# Patient Record
Sex: Female | Born: 1945 | Race: Black or African American | Hispanic: No | Marital: Married | Smoking: Never smoker
Health system: Southern US, Community
[De-identification: ages and names within clinical notes are randomized; demographics above are authoritative.]

## PROBLEM LIST (undated history)

## (undated) DIAGNOSIS — I10 Essential (primary) hypertension: Secondary | ICD-10-CM

## (undated) DIAGNOSIS — I872 Venous insufficiency (chronic) (peripheral): Secondary | ICD-10-CM

## (undated) DIAGNOSIS — N760 Acute vaginitis: Secondary | ICD-10-CM

## (undated) DIAGNOSIS — G2581 Restless legs syndrome: Secondary | ICD-10-CM

## (undated) DIAGNOSIS — R609 Edema, unspecified: Secondary | ICD-10-CM

## (undated) DIAGNOSIS — F419 Anxiety disorder, unspecified: Secondary | ICD-10-CM

## (undated) DIAGNOSIS — M109 Gout, unspecified: Secondary | ICD-10-CM

## (undated) DIAGNOSIS — M519 Unspecified thoracic, thoracolumbar and lumbosacral intervertebral disc disorder: Secondary | ICD-10-CM

## (undated) DIAGNOSIS — J45909 Unspecified asthma, uncomplicated: Secondary | ICD-10-CM

## (undated) DIAGNOSIS — J309 Allergic rhinitis, unspecified: Secondary | ICD-10-CM

## (undated) DIAGNOSIS — D179 Benign lipomatous neoplasm, unspecified: Secondary | ICD-10-CM

## (undated) DIAGNOSIS — G4730 Sleep apnea, unspecified: Secondary | ICD-10-CM

## (undated) DIAGNOSIS — L723 Sebaceous cyst: Secondary | ICD-10-CM

## (undated) DIAGNOSIS — I491 Atrial premature depolarization: Secondary | ICD-10-CM

## (undated) DIAGNOSIS — N183 Chronic kidney disease, stage 3 unspecified: Secondary | ICD-10-CM

## (undated) DIAGNOSIS — R011 Cardiac murmur, unspecified: Secondary | ICD-10-CM

## (undated) DIAGNOSIS — E1165 Type 2 diabetes mellitus with hyperglycemia: Secondary | ICD-10-CM

## (undated) DIAGNOSIS — E785 Hyperlipidemia, unspecified: Secondary | ICD-10-CM

## (undated) DIAGNOSIS — IMO0001 Reserved for inherently not codable concepts without codable children: Secondary | ICD-10-CM

## (undated) DIAGNOSIS — E669 Obesity, unspecified: Secondary | ICD-10-CM

## (undated) DIAGNOSIS — N289 Disorder of kidney and ureter, unspecified: Secondary | ICD-10-CM

## (undated) DIAGNOSIS — R6889 Other general symptoms and signs: Secondary | ICD-10-CM

## (undated) DIAGNOSIS — I5032 Chronic diastolic (congestive) heart failure: Secondary | ICD-10-CM

## (undated) DIAGNOSIS — E559 Vitamin D deficiency, unspecified: Secondary | ICD-10-CM

## (undated) DIAGNOSIS — I319 Disease of pericardium, unspecified: Secondary | ICD-10-CM

## (undated) HISTORY — DX: Vitamin D deficiency, unspecified: E55.9

## (undated) HISTORY — DX: Reserved for inherently not codable concepts without codable children: IMO0001

## (undated) HISTORY — DX: Gout, unspecified: M10.9

## (undated) HISTORY — DX: Sleep apnea, unspecified: G47.30

## (undated) HISTORY — DX: Edema, unspecified: R60.9

## (undated) HISTORY — DX: Allergic rhinitis, unspecified: J30.9

## (undated) HISTORY — DX: Unspecified asthma, uncomplicated: J45.909

## (undated) HISTORY — DX: Type 2 diabetes mellitus with hyperglycemia: E11.65

## (undated) HISTORY — DX: Anxiety disorder, unspecified: F41.9

## (undated) HISTORY — DX: Other general symptoms and signs: R68.89

## (undated) HISTORY — DX: Cardiac murmur, unspecified: R01.1

## (undated) HISTORY — DX: Hyperlipidemia, unspecified: E78.5

## (undated) HISTORY — DX: Acute vaginitis: N76.0

## (undated) HISTORY — DX: Essential (primary) hypertension: I10

## (undated) HISTORY — DX: Disease of pericardium, unspecified: I31.9

## (undated) HISTORY — DX: Unspecified thoracic, thoracolumbar and lumbosacral intervertebral disc disorder: M51.9

## (undated) HISTORY — PX: PERICARDIUM SURGERY: SHX742

## (undated) HISTORY — DX: Restless legs syndrome: G25.81

## (undated) HISTORY — DX: Sebaceous cyst: L72.3

## (undated) HISTORY — DX: Benign lipomatous neoplasm, unspecified: D17.9

---

## 2000-08-23 HISTORY — PX: CARDIAC CATHETERIZATION: SHX172

## 2003-08-24 LAB — HM COLONOSCOPY: HM Colonoscopy: NORMAL

## 2003-12-18 ENCOUNTER — Ambulatory Visit (HOSPITAL_COMMUNITY): Admission: RE | Admit: 2003-12-18 | Discharge: 2003-12-18 | Payer: Self-pay | Admitting: Gastroenterology

## 2004-11-27 ENCOUNTER — Ambulatory Visit: Payer: Self-pay | Admitting: Hematology & Oncology

## 2005-02-24 ENCOUNTER — Ambulatory Visit: Payer: Self-pay | Admitting: Hematology & Oncology

## 2005-04-28 ENCOUNTER — Ambulatory Visit: Payer: Self-pay | Admitting: Hematology & Oncology

## 2005-06-30 ENCOUNTER — Ambulatory Visit: Payer: Self-pay | Admitting: Hematology & Oncology

## 2005-11-17 ENCOUNTER — Ambulatory Visit: Payer: Self-pay | Admitting: Hematology & Oncology

## 2005-12-16 LAB — CBC WITH DIFFERENTIAL/PLATELET
Eosinophils Absolute: 0.2 10*3/uL (ref 0.0–0.5)
LYMPH%: 31.5 % (ref 14.0–48.0)
MCV: 85.5 fL (ref 81.0–101.0)
MONO%: 10.4 % (ref 0.0–13.0)
NEUT#: 2.5 10*3/uL (ref 1.5–6.5)
Platelets: 216 10*3/uL (ref 145–400)
RBC: 3.89 10*6/uL (ref 3.70–5.32)

## 2005-12-16 LAB — FERRITIN: Ferritin: 112 ng/mL (ref 10–291)

## 2006-01-11 ENCOUNTER — Ambulatory Visit: Payer: Self-pay | Admitting: Hematology & Oncology

## 2006-02-10 LAB — CBC WITH DIFFERENTIAL/PLATELET
Basophils Absolute: 0 10*3/uL (ref 0.0–0.1)
Eosinophils Absolute: 0.2 10*3/uL (ref 0.0–0.5)
HCT: 36.3 % (ref 34.8–46.6)
HGB: 12.1 g/dL (ref 11.6–15.9)
LYMPH%: 32.5 % (ref 14.0–48.0)
MCV: 85 fL (ref 81.0–101.0)
MONO%: 11.6 % (ref 0.0–13.0)
NEUT#: 2.8 10*3/uL (ref 1.5–6.5)
Platelets: 167 10*3/uL (ref 145–400)
RDW: 13.9 % (ref 11.3–14.5)

## 2006-03-07 ENCOUNTER — Ambulatory Visit: Payer: Self-pay | Admitting: Hematology & Oncology

## 2006-03-10 LAB — CBC WITH DIFFERENTIAL/PLATELET
BASO%: 0.4 % (ref 0.0–2.0)
Basophils Absolute: 0 10*3/uL (ref 0.0–0.1)
EOS%: 2.9 % (ref 0.0–7.0)
HGB: 12.8 g/dL (ref 11.6–15.9)
MCH: 28 pg (ref 26.0–34.0)
MONO%: 10.7 % (ref 0.0–13.0)
NEUT#: 3.3 10*3/uL (ref 1.5–6.5)
RBC: 4.55 10*6/uL (ref 3.70–5.32)
RDW: 14.1 % (ref 11.3–14.5)
lymph#: 1.8 10*3/uL (ref 0.9–3.3)

## 2006-03-17 LAB — CBC WITH DIFFERENTIAL/PLATELET
Basophils Absolute: 0 10*3/uL (ref 0.0–0.1)
Eosinophils Absolute: 0.2 10*3/uL (ref 0.0–0.5)
HGB: 13 g/dL (ref 11.6–15.9)
MCV: 84.7 fL (ref 81.0–101.0)
MONO#: 0.7 10*3/uL (ref 0.1–0.9)
NEUT#: 3.5 10*3/uL (ref 1.5–6.5)
RBC: 4.59 10*6/uL (ref 3.70–5.32)
RDW: 13.8 % (ref 11.3–14.5)
WBC: 6.4 10*3/uL (ref 3.9–10.0)
lymph#: 2.1 10*3/uL (ref 0.9–3.3)

## 2006-04-07 LAB — CBC WITH DIFFERENTIAL/PLATELET
BASO%: 0.6 % (ref 0.0–2.0)
EOS%: 3.9 % (ref 0.0–7.0)
HCT: 36 % (ref 34.8–46.6)
LYMPH%: 32.8 % (ref 14.0–48.0)
MCH: 28.1 pg (ref 26.0–34.0)
MCHC: 33.3 g/dL (ref 32.0–36.0)
MCV: 84.3 fL (ref 81.0–101.0)
NEUT%: 51 % (ref 39.6–76.8)
Platelets: 189 10*3/uL (ref 145–400)

## 2006-05-03 ENCOUNTER — Ambulatory Visit: Payer: Self-pay | Admitting: Hematology & Oncology

## 2006-05-05 LAB — CBC WITH DIFFERENTIAL/PLATELET
BASO%: 0.7 % (ref 0.0–2.0)
EOS%: 3.3 % (ref 0.0–7.0)
MCH: 28.2 pg (ref 26.0–34.0)
MCHC: 33.5 g/dL (ref 32.0–36.0)
MCV: 84.2 fL (ref 81.0–101.0)
MONO%: 10.2 % (ref 0.0–13.0)
NEUT%: 53.2 % (ref 39.6–76.8)
RDW: 14.1 % (ref 11.3–14.5)
lymph#: 1.8 10*3/uL (ref 0.9–3.3)

## 2006-06-28 ENCOUNTER — Ambulatory Visit: Payer: Self-pay | Admitting: Hematology & Oncology

## 2006-06-30 LAB — CBC WITH DIFFERENTIAL/PLATELET
Basophils Absolute: 0.1 10*3/uL (ref 0.0–0.1)
Eosinophils Absolute: 0.2 10*3/uL (ref 0.0–0.5)
HCT: 36.3 % (ref 34.8–46.6)
LYMPH%: 36.7 % (ref 14.0–48.0)
MCHC: 33.1 g/dL (ref 32.0–36.0)
MONO#: 0.5 10*3/uL (ref 0.1–0.9)
NEUT#: 2.8 10*3/uL (ref 1.5–6.5)
NEUT%: 48.5 % (ref 39.6–76.8)
Platelets: 176 10*3/uL (ref 145–400)
WBC: 5.8 10*3/uL (ref 3.9–10.0)

## 2006-08-20 ENCOUNTER — Ambulatory Visit: Payer: Self-pay | Admitting: Hematology & Oncology

## 2006-08-25 LAB — CBC WITH DIFFERENTIAL/PLATELET
BASO%: 0.4 % (ref 0.0–2.0)
EOS%: 4.5 % (ref 0.0–7.0)
HCT: 36.6 % (ref 34.8–46.6)
LYMPH%: 31.5 % (ref 14.0–48.0)
MCH: 28.2 pg (ref 26.0–34.0)
MCHC: 33.1 g/dL (ref 32.0–36.0)
MCV: 85 fL (ref 81.0–101.0)
MONO%: 9.5 % (ref 0.0–13.0)
NEUT%: 54.1 % (ref 39.6–76.8)
Platelets: 181 10*3/uL (ref 145–400)
lymph#: 2.3 10*3/uL (ref 0.9–3.3)

## 2006-09-22 LAB — CBC WITH DIFFERENTIAL/PLATELET
BASO%: 0.7 % (ref 0.0–2.0)
EOS%: 4.5 % (ref 0.0–7.0)
MCH: 28.7 pg (ref 26.0–34.0)
MCHC: 33.4 g/dL (ref 32.0–36.0)
RBC: 4.22 10*6/uL (ref 3.70–5.32)
RDW: 14.1 % (ref 11.3–14.5)
lymph#: 2 10*3/uL (ref 0.9–3.3)

## 2006-12-12 ENCOUNTER — Ambulatory Visit: Payer: Self-pay | Admitting: Hematology & Oncology

## 2007-03-14 ENCOUNTER — Ambulatory Visit: Payer: Self-pay | Admitting: Hematology & Oncology

## 2007-03-16 LAB — CBC WITH DIFFERENTIAL/PLATELET
Basophils Absolute: 0 10*3/uL (ref 0.0–0.1)
Eosinophils Absolute: 0.3 10*3/uL (ref 0.0–0.5)
HCT: 35.1 % (ref 34.8–46.6)
HGB: 12.1 g/dL (ref 11.6–15.9)
LYMPH%: 29.2 % (ref 14.0–48.0)
MONO#: 0.6 10*3/uL (ref 0.1–0.9)
NEUT#: 4.4 10*3/uL (ref 1.5–6.5)
NEUT%: 58.7 % (ref 39.6–76.8)
Platelets: 166 10*3/uL (ref 145–400)
RBC: 4.19 10*6/uL (ref 3.70–5.32)
WBC: 7.6 10*3/uL (ref 3.9–10.0)

## 2007-05-19 ENCOUNTER — Ambulatory Visit: Payer: Self-pay | Admitting: Nephrology

## 2007-06-06 ENCOUNTER — Ambulatory Visit: Payer: Self-pay | Admitting: Hematology & Oncology

## 2008-04-20 ENCOUNTER — Emergency Department: Payer: Self-pay | Admitting: Emergency Medicine

## 2008-07-24 ENCOUNTER — Ambulatory Visit: Payer: Self-pay | Admitting: Family Medicine

## 2009-04-14 ENCOUNTER — Ambulatory Visit: Payer: Self-pay | Admitting: Family Medicine

## 2009-10-16 ENCOUNTER — Ambulatory Visit: Payer: Self-pay | Admitting: Family Medicine

## 2010-01-14 ENCOUNTER — Ambulatory Visit: Payer: Self-pay | Admitting: Family Medicine

## 2010-11-18 ENCOUNTER — Ambulatory Visit: Payer: Self-pay | Admitting: Family Medicine

## 2013-04-09 LAB — HM PAP SMEAR: HM Pap smear: NORMAL

## 2013-04-27 ENCOUNTER — Ambulatory Visit: Payer: Self-pay | Admitting: Family Medicine

## 2013-05-16 ENCOUNTER — Ambulatory Visit: Payer: Self-pay | Admitting: Family Medicine

## 2013-10-02 DIAGNOSIS — N189 Chronic kidney disease, unspecified: Secondary | ICD-10-CM | POA: Diagnosis not present

## 2013-10-02 DIAGNOSIS — I10 Essential (primary) hypertension: Secondary | ICD-10-CM | POA: Diagnosis not present

## 2013-10-02 DIAGNOSIS — IMO0001 Reserved for inherently not codable concepts without codable children: Secondary | ICD-10-CM | POA: Diagnosis not present

## 2013-10-02 DIAGNOSIS — J45909 Unspecified asthma, uncomplicated: Secondary | ICD-10-CM | POA: Diagnosis not present

## 2013-11-08 ENCOUNTER — Encounter: Payer: Self-pay | Admitting: *Deleted

## 2013-11-08 DIAGNOSIS — I10 Essential (primary) hypertension: Secondary | ICD-10-CM | POA: Diagnosis not present

## 2013-11-08 DIAGNOSIS — R6889 Other general symptoms and signs: Secondary | ICD-10-CM | POA: Diagnosis not present

## 2013-11-08 DIAGNOSIS — R0789 Other chest pain: Secondary | ICD-10-CM | POA: Diagnosis not present

## 2013-11-09 ENCOUNTER — Encounter (INDEPENDENT_AMBULATORY_CARE_PROVIDER_SITE_OTHER): Payer: Self-pay

## 2013-11-09 ENCOUNTER — Encounter: Payer: Self-pay | Admitting: Cardiovascular Disease

## 2013-11-09 ENCOUNTER — Other Ambulatory Visit: Payer: Self-pay | Admitting: Cardiovascular Disease

## 2013-11-09 ENCOUNTER — Ambulatory Visit (INDEPENDENT_AMBULATORY_CARE_PROVIDER_SITE_OTHER): Payer: Medicare Other | Admitting: Cardiovascular Disease

## 2013-11-09 VITALS — BP 158/70 | HR 67 | Ht 69.0 in | Wt 396.5 lb

## 2013-11-09 DIAGNOSIS — E785 Hyperlipidemia, unspecified: Secondary | ICD-10-CM

## 2013-11-09 DIAGNOSIS — R0602 Shortness of breath: Secondary | ICD-10-CM

## 2013-11-09 DIAGNOSIS — I4891 Unspecified atrial fibrillation: Secondary | ICD-10-CM | POA: Insufficient documentation

## 2013-11-09 DIAGNOSIS — R079 Chest pain, unspecified: Secondary | ICD-10-CM | POA: Diagnosis not present

## 2013-11-09 DIAGNOSIS — I10 Essential (primary) hypertension: Secondary | ICD-10-CM

## 2013-11-09 LAB — BASIC METABOLIC PANEL
Anion Gap: 7 (ref 7–16)
BUN: 23 mg/dL — ABNORMAL HIGH (ref 7–18)
Calcium, Total: 8.5 mg/dL (ref 8.5–10.1)
Chloride: 108 mmol/L — ABNORMAL HIGH (ref 98–107)
Co2: 23 mmol/L (ref 21–32)
Creatinine: 1.45 mg/dL — ABNORMAL HIGH (ref 0.60–1.30)
EGFR (African American): 44 — ABNORMAL LOW
EGFR (Non-African Amer.): 38 — ABNORMAL LOW
Glucose: 166 mg/dL — ABNORMAL HIGH (ref 65–99)
OSMOLALITY: 283 (ref 275–301)
POTASSIUM: 4 mmol/L (ref 3.5–5.1)
Sodium: 138 mmol/L (ref 136–145)

## 2013-11-09 LAB — CBC WITH DIFFERENTIAL/PLATELET
BASOS PCT: 0.8 %
Basophil #: 0.1 10*3/uL (ref 0.0–0.1)
EOS ABS: 0.2 10*3/uL (ref 0.0–0.7)
EOS PCT: 1.8 %
HCT: 34.9 % — ABNORMAL LOW (ref 35.0–47.0)
HGB: 11.1 g/dL — ABNORMAL LOW (ref 12.0–16.0)
LYMPHS PCT: 20.9 %
Lymphocyte #: 2.1 10*3/uL (ref 1.0–3.6)
MCH: 26.7 pg (ref 26.0–34.0)
MCHC: 31.9 g/dL — AB (ref 32.0–36.0)
MCV: 84 fL (ref 80–100)
MONO ABS: 0.7 x10 3/mm (ref 0.2–0.9)
Monocyte %: 6.7 %
NEUTROS ABS: 7.1 10*3/uL — AB (ref 1.4–6.5)
Neutrophil %: 69.8 %
PLATELETS: 244 10*3/uL (ref 150–440)
RBC: 4.18 10*6/uL (ref 3.80–5.20)
RDW: 14.9 % — AB (ref 11.5–14.5)
WBC: 10.2 10*3/uL (ref 3.6–11.0)

## 2013-11-09 LAB — TROPONIN I: Troponin-I: <0.02 ng/mL

## 2013-11-09 MED ORDER — APIXABAN 5 MG PO TABS: 5.0000 mg | ORAL_TABLET | Freq: Two times a day (BID) | ORAL | Status: DC

## 2013-11-09 NOTE — Patient Instructions (Signed)
Labs today.   Your physician has requested that you have an echocardiogram. Echocardiography is a painless test that uses sound waves to create images of your heart. It provides your doctor with information about the size and shape of your heart and how well your heart's chambers and valves are working. This procedure takes approximately one hour. There are no restrictions for this procedure.  Stop Aspirin.   Start Eliquis 5 mg twice daily (blood thinner).   Follow up after echo.

## 2013-11-09 NOTE — Assessment & Plan Note (Signed)
Blood pressure is elevated but seems to be coming down.

## 2013-11-09 NOTE — Assessment & Plan Note (Signed)
The patient has history of onset of atrial fibrillation. She is currently in atrial fibrillation of unknown duration. Ventricular rate is controlled on diltiazem. CHADS2 VASc score is 4. Thus, she is at high risk for thromboembolic complications. I discussed with her the risks and benefits of anticoagulation I decided to start treatment with Eliquis 5 mg twice daily. This is a good option given chronic kidney disease. Check labs today including CBC and basic metabolic profile. Obtain an echocardiogram.

## 2013-11-09 NOTE — Assessment & Plan Note (Addendum)
The describes substernal chest tightness is concerning given her multiple risk factors for coronary artery disease. Fortunately, she has not had any further discomfort since Wednesday. It is possible that the chest pain was triggered by uncontrolled hypertension with supply demand ischemia. I am going to check troponin level today to ensure that there was no ischemic event. I will also obtain an echocardiogram for evaluation especially with the presence of atrial fibrillation. She will require further ischemic workup which unfortunately is limited by morbid obesity. She is not able to exercise on a treadmill. Other forms of stress testing will probably have limited diagnostic accuracy. Radial cardiac catheterization as an option especially if she gets recurrent symptoms but will wait until obtaining further workup. I instructed her to call us with any recurrent symptoms. I also asked her to go to the emergency room if she develops chest pain that does not resolve after 5 minutes.

## 2013-11-09 NOTE — Progress Notes (Signed)
Primary care physician: Dr. Ancil Boozer  HPI  This is a pleasant 69 year old African American female who was referred for evaluation of chest pain. She reports being hospitalized in 2002 at [facility] for pericardial effusion which required drainage. She remembers having cardiac catheterization and was told that there was no significant coronary artery disease. There is a history of paroxysmal atrial fibrillation. She has multiple chronic medical conditions that include type 2 diabetes, hypertension, chronic kidney disease and morbid obesity. There is no history of stroke or myocardial infarction. She is a lifelong nonsmoker and has no family history of premature coronary artery disease. She had a recent gastroenteritis with vomiting and diarrhea. She was not able to keep her medications down for a few days and thus blood pressure was running high. On Wednesday, she had substernal chest tightness which lasted almost all day both at rest and was worse with physical activities. This has resolved since then with no recurrent episodes she complains of chronic exertional dyspnea with minimal activities with no orthopnea, PND or significant lower extremity edema.  Allergies  Allergen Reactions  . Ace Inhibitors      Current Outpatient Prescriptions on File Prior to Visit  Medication Sig Dispense Refill  . albuterol (PROVENTIL HFA;VENTOLIN HFA) 108 (90 BASE) MCG/ACT inhaler Inhale 2 puffs into the lungs every 6 (six) hours as needed for wheezing or shortness of breath.      . Cholecalciferol (VITAMIN D) 2000 UNITS tablet Take 2,000 Units by mouth daily.      . colchicine (COLCRYS) 0.6 MG tablet Take 0.6 mg by mouth 2 (two) times daily as needed.      . diltiazem (CARDIZEM CD) 300 MG 24 hr capsule Take 300 mg by mouth daily.      . fluticasone (FLONASE) 50 MCG/ACT nasal spray Place 2 sprays into both nostrils daily.      . Fluticasone-Salmeterol (ADVAIR) 250-50 MCG/DOSE AEPB Inhale 1 puff into the lungs 2 (two)  times daily.      . furosemide (LASIX) 40 MG tablet Take 40 mg by mouth as needed.      . hydrALAZINE (APRESOLINE) 50 MG tablet Take 50 mg by mouth 3 (three) times daily.      . Insulin Pen Needle (NOVOFINE) 30G X 8 MM MISC Inject 1 packet into the skin daily.      . irbesartan (AVAPRO) 300 MG tablet Take 300 mg by mouth daily.      . potassium chloride SA (K-DUR,KLOR-CON) 20 MEQ tablet Take 20 mEq by mouth as needed.      . pravastatin (PRAVACHOL) 80 MG tablet Take 80 mg by mouth daily.       No current facility-administered medications on file prior to visit.     Past Medical History  Diagnosis Date  . Allergic rhinitis, cause unspecified   . Unspecified vitamin D deficiency   . Gout, unspecified   . Edema   . Sebaceous cyst   . Lipoma of unspecified site   . Vaginitis and vulvovaginitis, unspecified   . Other general symptoms(780.99)   . Type II or unspecified type diabetes mellitus without mention of complication, uncontrolled   . Unspecified asthma(493.90)   . Other and unspecified disc disorder of lumbar region   . Unspecified sleep apnea   . Restless legs syndrome (RLS)   . Obesity, unspecified   . A-fib   . Unspecified disease of pericardium   . Chronic kidney disease, unspecified   . Heart murmur  as child  . Arrhythmia     afib  . Essential hypertension, benign   . Hyperlipidemia      Past Surgical History  Procedure Laterality Date  . Cardiac catheterization  2002    [facility]  . Pericardium surgery       Family History  Problem Relation Age of Onset  . Heart attack Mother   . Hypertension Mother      History   Social History  . Marital Status: Married    Spouse Name: N/A    Number of Children: N/A  . Years of Education: N/A   Occupational History  . Not on file.   Social History Main Topics  . Smoking status: Never Smoker   . Smokeless tobacco: Not on file  . Alcohol Use: No  . Drug Use: No  . Sexual Activity: Not on file   Other  Topics Concern  . Not on file   Social History Narrative  . No narrative on file     ROS A 10 point review of system was performed. It is negative other than that mentioned in the history of present illness.   PHYSICAL EXAM   BP 158/70  Pulse 67  Ht 5\' 9"  (1.753 m)  Wt 396 lb 8 oz (179.851 kg)  BMI 58.53 kg/m2 Constitutional: She is oriented to person, place, and time. She is morbidly obese. No distress.  HENT: No nasal discharge.  Head: Normocephalic and atraumatic.  Eyes: Pupils are equal and round. No discharge.  Neck: Normal range of motion. Neck supple. No JVD present. No thyromegaly present.  Cardiovascular: Normal rate, irregular rhythm, normal heart sounds. Exam reveals no gallop and no friction rub. There is a 1/6 systolic ejection murmur at the aortic area.  Pulmonary/Chest: Effort normal and breath sounds normal. No stridor. No respiratory distress. She has no wheezes. She has no rales. She exhibits no tenderness.  Abdominal: Soft. Bowel sounds are normal. She exhibits no distension. There is no tenderness. There is no rebound and no guarding.  Musculoskeletal: Normal range of motion. She exhibits no edema and no tenderness.  Neurological: She is alert and oriented to person, place, and time. Coordination normal.  Skin: Skin is warm and dry. No rash noted. She is not diaphoretic. No erythema. No pallor.  Psychiatric: She has a normal mood and affect. Her behavior is normal. Judgment and thought content normal.     EKG: Atrial fibrillation with ventricular rate of 67 beats per minute. No significant ST or T wave changes.   ASSESSMENT AND PLAN

## 2013-11-09 NOTE — Assessment & Plan Note (Signed)
I agree with treatment with a statin with a target LDL of less than 100 given history of diabetes.

## 2013-11-12 ENCOUNTER — Telehealth: Payer: Self-pay

## 2013-11-12 NOTE — Telephone Encounter (Signed)
Patient stated she could not afford Eliquis because her co pay is $160. She will check with her insurance about Xarelto and let us know.

## 2013-11-12 NOTE — Telephone Encounter (Signed)
Patient stated that her insurance said her copay on both meds will be around $160 and she can not afford that. She is also feeling short of breath and having chest pressure when she is up walking. It resolves with rest.

## 2013-11-12 NOTE — Telephone Encounter (Signed)
The shortness of breath and chest pain could be related to her weight but we have to rule out a cardiac cause.  We can try Savaysa (with discount card) or Warfarin. I have to see what her renal function first.

## 2013-11-12 NOTE — Telephone Encounter (Signed)
Ask her to check with her insurance to see if Xarelto is cheaper. Is she able to afford Eliquis or not?

## 2013-11-12 NOTE — Telephone Encounter (Signed)
Pt called to let Dr Fletcher Anon know that her blood thinner is over $45.

## 2013-11-13 DIAGNOSIS — I10 Essential (primary) hypertension: Secondary | ICD-10-CM | POA: Diagnosis not present

## 2013-11-13 DIAGNOSIS — F411 Generalized anxiety disorder: Secondary | ICD-10-CM | POA: Diagnosis not present

## 2013-11-13 DIAGNOSIS — E785 Hyperlipidemia, unspecified: Secondary | ICD-10-CM | POA: Diagnosis not present

## 2013-11-13 DIAGNOSIS — IMO0001 Reserved for inherently not codable concepts without codable children: Secondary | ICD-10-CM | POA: Diagnosis not present

## 2013-11-16 ENCOUNTER — Other Ambulatory Visit (INDEPENDENT_AMBULATORY_CARE_PROVIDER_SITE_OTHER): Payer: Medicare Other

## 2013-11-16 ENCOUNTER — Other Ambulatory Visit: Payer: Self-pay

## 2013-11-16 DIAGNOSIS — R0602 Shortness of breath: Secondary | ICD-10-CM

## 2013-11-16 DIAGNOSIS — I4891 Unspecified atrial fibrillation: Secondary | ICD-10-CM | POA: Diagnosis not present

## 2013-11-16 DIAGNOSIS — R079 Chest pain, unspecified: Secondary | ICD-10-CM | POA: Diagnosis not present

## 2013-11-16 NOTE — Telephone Encounter (Signed)
LVM 11/16/13

## 2013-11-16 NOTE — Telephone Encounter (Signed)
Labs are in from [facility]

## 2013-11-16 NOTE — Telephone Encounter (Signed)
Labs are stable. We can try Savaysa 30 mg once daily.

## 2013-11-19 MED ORDER — EDOXABAN TOSYLATE 30 MG PO TABS: 30.0000 mg | ORAL_TABLET | Freq: Once | ORAL | Status: DC

## 2013-11-19 NOTE — Telephone Encounter (Signed)
Informed patient that per Dr. Fletcher Anon start patient on Savaysa 30 mg daily  Informed patient that med was ordered, samples left at front desk with RX discount card

## 2013-11-20 ENCOUNTER — Telehealth: Payer: Self-pay | Admitting: *Deleted

## 2013-11-20 ENCOUNTER — Telehealth: Payer: Self-pay

## 2013-11-20 ENCOUNTER — Other Ambulatory Visit: Payer: Self-pay | Admitting: *Deleted

## 2013-11-20 MED ORDER — EDOXABAN TOSYLATE 30 MG PO TABS: 30.0000 mg | ORAL_TABLET | Freq: Every day | ORAL | Status: DC

## 2013-11-20 MED ORDER — APIXABAN 5 MG PO TABS: 5.0000 mg | ORAL_TABLET | Freq: Two times a day (BID) | ORAL | Status: DC

## 2013-11-20 NOTE — Telephone Encounter (Signed)
Patient stated she is going to get financial help for the Eliquis 5 mg twice daily and would rather stick with that after doing internet research.

## 2013-11-20 NOTE — Telephone Encounter (Signed)
LVM 3/31

## 2013-11-20 NOTE — Telephone Encounter (Signed)
Pt daughter called and wants to let you know some information regarding Xarelto. May call pt on cell if need be.

## 2013-11-20 NOTE — Telephone Encounter (Signed)
Patient has decided on Eliquis 5 mg bid  Per Danella Sensing PA-C it is ok to switch patient back to Eliquis.  Med changed  Samples left at front desk

## 2013-11-20 NOTE — Telephone Encounter (Signed)
Patient called and she has some questions on medications.

## 2013-11-20 NOTE — Telephone Encounter (Signed)
Requested Prescriptions   Signed Prescriptions Disp Refills  . Edoxaban Tosylate (SAVAYSA) 30 MG TABS 30 tablet 3    Sig: Take 30 mg by mouth daily.    Authorizing Provider: Kathlyn Sacramento A    Ordering User: Britt Bottom

## 2013-11-29 ENCOUNTER — Ambulatory Visit (INDEPENDENT_AMBULATORY_CARE_PROVIDER_SITE_OTHER): Payer: Medicare Other | Admitting: Cardiovascular Disease

## 2013-11-29 ENCOUNTER — Encounter: Payer: Self-pay | Admitting: Cardiovascular Disease

## 2013-11-29 VITALS — BP 138/68 | HR 79 | Ht 70.0 in | Wt 396.5 lb

## 2013-11-29 DIAGNOSIS — R079 Chest pain, unspecified: Secondary | ICD-10-CM | POA: Diagnosis not present

## 2013-11-29 DIAGNOSIS — I491 Atrial premature depolarization: Secondary | ICD-10-CM

## 2013-11-29 DIAGNOSIS — I4891 Unspecified atrial fibrillation: Secondary | ICD-10-CM

## 2013-11-29 DIAGNOSIS — I10 Essential (primary) hypertension: Secondary | ICD-10-CM

## 2013-11-29 MED ORDER — ASPIRIN 81 MG PO TABS: 81.0000 mg | ORAL_TABLET | Freq: Every day | ORAL | Status: DC

## 2013-11-29 NOTE — Patient Instructions (Signed)
Stop Eliquis.  Continue Aspirin 81 mg once daily.   Your physician wants you to follow-up in: 6 months.  You will receive a reminder letter in the mail two months in advance. If you don't receive a letter, please call our office to schedule the follow-up appointment.

## 2013-11-29 NOTE — Assessment & Plan Note (Signed)
Blood pressure is now back to normal.

## 2013-11-29 NOTE — Assessment & Plan Note (Signed)
Initial EKG was thought to be atrial fibrillation. However, after careful review I see that there small P waves with frequent PACs. Also during echocardiogram, there was normal A velocity. EKG today shows sinus rhythm with PACs as well. No need for anticoagulation at this time. Continue to monitor. Symptoms are controlled with diltiazem. I will have her followup with me in 6 months.

## 2013-11-29 NOTE — Assessment & Plan Note (Signed)
She reports resolution of symptoms. Chest pain was likely related to uncontrolled hypertension. She feels back to her baseline. Echocardiogram showed normal LV systolic function. No further workup is recommended at this time. If she has recurrent symptoms, further ischemic evaluation is recommended.

## 2013-11-29 NOTE — Progress Notes (Signed)
Primary care physician: Dr. Ancil Boozer  HPI  This is a pleasant 68 year old African American female who is here today for a followup visit regarding chest pain and possible atrial fibrillation.  She reports being hospitalized in 2002 at [facility] for pericardial effusion which required drainage. She remembers having cardiac catheterization and was told that there was no significant coronary artery disease. There is questionable history of paroxysmal atrial fibrillation versus PACs. She has multiple chronic medical conditions that include type 2 diabetes, hypertension, chronic kidney disease and morbid obesity. There is no history of stroke or myocardial infarction. She is a lifelong nonsmoker and has no family history of premature coronary artery disease.  She was seen recently for chest pain in the setting of gastroenteritis and uncontrolled hypertension. EKG showed possible atrial fibrillation . However, after second review it appears to be sinus with frequent PACs and ectopic atrial rhythm.  An echocardiogram was done which showed normal LV systolic function, mildly dilated left atrium, borderline pulmonary hypertension and trivial pericardial effusion. She feels back to her baseline. No more chest pain. Dyspnea improved significantly.   Allergies  Allergen Reactions  . Ace Inhibitors      Current Outpatient Prescriptions on File Prior to Visit  Medication Sig Dispense Refill  . albuterol (PROVENTIL HFA;VENTOLIN HFA) 108 (90 BASE) MCG/ACT inhaler Inhale 2 puffs into the lungs every 6 (six) hours as needed for wheezing or shortness of breath.      . Cholecalciferol (VITAMIN D) 2000 UNITS tablet Take 2,000 Units by mouth daily.      . colchicine (COLCRYS) 0.6 MG tablet Take 0.6 mg by mouth 2 (two) times daily as needed.      . diltiazem (CARDIZEM CD) 300 MG 24 hr capsule Take 300 mg by mouth daily.      . fluconazole (DIFLUCAN) 150 MG tablet Take 150 mg by mouth as needed.      . fluticasone  (FLONASE) 50 MCG/ACT nasal spray Place 2 sprays into both nostrils daily.      . Fluticasone-Salmeterol (ADVAIR) 250-50 MCG/DOSE AEPB Inhale 1 puff into the lungs 2 (two) times daily.      . furosemide (LASIX) 40 MG tablet Take 40 mg by mouth as needed.      . hydrALAZINE (APRESOLINE) 50 MG tablet Take 50 mg by mouth 3 (three) times daily.      . insulin aspart protamine- aspart (NOVOLOG MIX 70/30) (70-30) 100 UNIT/ML injection Inject 45-50 Units into the skin 2 (two) times daily with a meal.      . Insulin Pen Needle (NOVOFINE) 30G X 8 MM MISC Inject 1 packet into the skin daily.      . irbesartan (AVAPRO) 300 MG tablet Take 300 mg by mouth daily.      . potassium chloride SA (K-DUR,KLOR-CON) 20 MEQ tablet Take 20 mEq by mouth as needed.      . pravastatin (PRAVACHOL) 80 MG tablet Take 80 mg by mouth daily.      . promethazine (PHENERGAN) 12.5 MG tablet Take 12.5 mg by mouth every 6 (six) hours as needed for nausea or vomiting.       No current facility-administered medications on file prior to visit.     Past Medical History  Diagnosis Date  . Allergic rhinitis, cause unspecified   . Unspecified vitamin D deficiency   . Gout, unspecified   . Edema   . Sebaceous cyst   . Lipoma of unspecified site   . Vaginitis and vulvovaginitis, unspecified   .  Other general symptoms(780.99)   . Type II or unspecified type diabetes mellitus without mention of complication, uncontrolled   . Unspecified asthma(493.90)   . Other and unspecified disc disorder of lumbar region   . Unspecified sleep apnea   . Restless legs syndrome (RLS)   . Obesity, unspecified   . A-fib   . Unspecified disease of pericardium   . Chronic kidney disease, unspecified   . Heart murmur     as child  . Arrhythmia     afib  . Essential hypertension, benign   . Hyperlipidemia      Past Surgical History  Procedure Laterality Date  . Cardiac catheterization  2002    [facility]  . Pericardium surgery       Family  History  Problem Relation Age of Onset  . Heart attack Mother   . Hypertension Mother      History   Social History  . Marital Status: Married    Spouse Name: N/A    Number of Children: N/A  . Years of Education: N/A   Occupational History  . Not on file.   Social History Main Topics  . Smoking status: Never Smoker   . Smokeless tobacco: Not on file  . Alcohol Use: No  . Drug Use: No  . Sexual Activity: Not on file   Other Topics Concern  . Not on file   Social History Narrative  . No narrative on file     ROS A 10 point review of system was performed. It is negative other than that mentioned in the history of present illness.   PHYSICAL EXAM   BP 138/68  Pulse 79  Ht 5\' 10"  (1.778 m)  Wt 396 lb 8 oz (179.851 kg)  BMI 56.89 kg/m2 Constitutional: She is oriented to person, place, and time. She is morbidly obese. No distress.  HENT: No nasal discharge.  Head: Normocephalic and atraumatic.  Eyes: Pupils are equal and round. No discharge.  Neck: Normal range of motion. Neck supple. No JVD present. No thyromegaly present.  Cardiovascular: Normal rate, regular rhythm with frequent premature beats, normal heart sounds. Exam reveals no gallop and no friction rub. There is a 1/6 systolic ejection murmur at the aortic area.  Pulmonary/Chest: Effort normal and breath sounds normal. No stridor. No respiratory distress. She has no wheezes. She has no rales. She exhibits no tenderness.  Abdominal: Soft. Bowel sounds are normal. She exhibits no distension. There is no tenderness. There is no rebound and no guarding.  Musculoskeletal: Normal range of motion. She exhibits no edema and no tenderness.  Neurological: She is alert and oriented to person, place, and time. Coordination normal.  Skin: Skin is warm and dry. No rash noted. She is not diaphoretic. No erythema. No pallor.  Psychiatric: She has a normal mood and affect. Her behavior is normal. Judgment and thought content  normal.     EKG: Sinus  Rhythm   occasional PAC    PRi = 58  # PACs = 1. -Nonspecific ST depression  -Nondiagnostic.   ABNORMAL    ASSESSMENT AND PLAN

## 2013-12-11 ENCOUNTER — Ambulatory Visit: Payer: Medicare Other | Admitting: Podiatry

## 2013-12-11 ENCOUNTER — Encounter: Payer: Self-pay | Admitting: Podiatry

## 2013-12-11 DIAGNOSIS — L600 Ingrowing nail: Secondary | ICD-10-CM | POA: Diagnosis not present

## 2013-12-11 NOTE — Progress Notes (Signed)
   Subjective:    Patient ID: Amber Reyes, female    DOB: 07/13/1946, 68 y.o.   MRN: [id number]  HPI PT STATED RT FOOT 2ND AND LT 3RD TOENAIL ARE SORE FOR 2 WEEKS. THE TOENAILS ARE GETTING WORSE. THE TOENAIL GET AGGRAVATED BY PRESSURE AND WEARING CLOSED SHOES. TRIED TO KEEP THEM TRIM BUT THEY KEEP GROWING THE SAME THING.    Review of Systems  Respiratory: Positive for shortness of breath.   Cardiovascular: Positive for palpitations.  Musculoskeletal: Positive for back pain and gait problem.       Objective:   Physical Exam        Assessment & Plan:

## 2013-12-11 NOTE — Progress Notes (Signed)
Subjective:     Patient ID: Amber Reyes, female   DOB: 06-14-1946, 68 y.o.   MRN: [id number]  HPI patient states that I have ingrown toenails on my second toe right my third toe left that are sore and I cannot trim myself do to obesity and not being able to reach them and the fact that there ingrown in the corners   Review of Systems  All other systems reviewed and are negative.      Objective:   Physical Exam  Nursing note and vitals reviewed. Constitutional: She is oriented to person, place, and time.  Cardiovascular: Intact distal pulses.   Musculoskeletal: Normal range of motion.  Neurological: She is oriented to person, place, and time.  Skin: Skin is warm.   ingrown toenail noted both feet with pain in the medial second lateral third toe. Neurovascular status intact with muscle strength adequate and range of motion subtalar midtarsal joint within normal limits patient is found to have incurvated second toe right medial border third left better painful when pressed inability to cut     Assessment:     Ingrown toenail deformity second right third left with pain when pressed    Plan:     H&P and x-ray reviewed and I recommend removal of corner nails. Infiltrated the right second toe 60 mg Xylocaine Marcaine the left third 60 mg Xylocaine Marcaine and remove the medial quarter second right lateral corner third left and apply chemical 3 applications 30 seconds and sterile dressing instructed on soaks and reappoint

## 2013-12-11 NOTE — Patient Instructions (Signed)

## 2014-01-04 DIAGNOSIS — F411 Generalized anxiety disorder: Secondary | ICD-10-CM | POA: Diagnosis not present

## 2014-01-04 DIAGNOSIS — IMO0001 Reserved for inherently not codable concepts without codable children: Secondary | ICD-10-CM | POA: Diagnosis not present

## 2014-01-08 ENCOUNTER — Ambulatory Visit (INDEPENDENT_AMBULATORY_CARE_PROVIDER_SITE_OTHER): Payer: Medicare Other | Admitting: Podiatry

## 2014-01-08 VITALS — BP 152/62 | HR 93 | Resp 16

## 2014-01-08 DIAGNOSIS — B351 Tinea unguium: Secondary | ICD-10-CM

## 2014-01-08 DIAGNOSIS — M79609 Pain in unspecified limb: Secondary | ICD-10-CM | POA: Diagnosis not present

## 2014-01-08 DIAGNOSIS — L600 Ingrowing nail: Secondary | ICD-10-CM

## 2014-01-08 NOTE — Progress Notes (Signed)
Subjective:     Patient ID: Amber Reyes, female   DOB: Mar 08, 1946, 68 y.o.   MRN: [id number]  HPI patient is found to have severe painful nailbeds 1-5 both feet and the second nail right remained incurvated as it did not relieve the discomfort on the medial side because of the significant C-shaped appearance   Review of Systems     Objective:   Physical Exam Neurovascular status intact with no health history changes noted and incurvated third nail right unfortunately involving almost the entire nail and thick yellow nails remaining nails with pain in the corners and obesity not allowing patient to cut herself    Assessment:     Ingrown toenail with damage the entire nail plate second right and mycotic nail infections of remaining nails    Plan:     H&P performed and recommended removal of the entire second nail. Patient wants to have this done and today I infiltrated 60 mg Xylocaine Marcaine mixture remove the second nail exposed matrix and applied chemical phenol 4 applications followed by alcohol lavaged and sterile dressing. I then went ahead and debrided all remaining nails carefully taken the corners out with no iatrogenic bleeding and reappoint her recheck

## 2014-01-08 NOTE — Patient Instructions (Signed)

## 2014-04-04 DIAGNOSIS — I10 Essential (primary) hypertension: Secondary | ICD-10-CM | POA: Diagnosis not present

## 2014-04-04 DIAGNOSIS — N183 Chronic kidney disease, stage 3 unspecified: Secondary | ICD-10-CM | POA: Diagnosis not present

## 2014-04-04 DIAGNOSIS — N186 End stage renal disease: Secondary | ICD-10-CM | POA: Diagnosis not present

## 2014-04-04 DIAGNOSIS — E1129 Type 2 diabetes mellitus with other diabetic kidney complication: Secondary | ICD-10-CM | POA: Diagnosis not present

## 2014-04-09 ENCOUNTER — Ambulatory Visit: Payer: Medicare Other | Admitting: Podiatry

## 2014-05-28 ENCOUNTER — Ambulatory Visit: Payer: Self-pay | Admitting: Family Medicine

## 2014-05-28 DIAGNOSIS — Z1231 Encounter for screening mammogram for malignant neoplasm of breast: Secondary | ICD-10-CM | POA: Diagnosis not present

## 2014-05-28 LAB — HM MAMMOGRAPHY: HM Mammogram: NORMAL

## 2014-06-13 DIAGNOSIS — Z794 Long term (current) use of insulin: Secondary | ICD-10-CM | POA: Diagnosis not present

## 2014-06-13 DIAGNOSIS — E11319 Type 2 diabetes mellitus with unspecified diabetic retinopathy without macular edema: Secondary | ICD-10-CM | POA: Diagnosis not present

## 2014-06-20 ENCOUNTER — Encounter: Payer: Self-pay | Admitting: Cardiovascular Disease

## 2014-06-20 ENCOUNTER — Ambulatory Visit (INDEPENDENT_AMBULATORY_CARE_PROVIDER_SITE_OTHER): Payer: Medicare Other | Admitting: Cardiovascular Disease

## 2014-06-20 VITALS — BP 125/79 | HR 84 | Ht 69.0 in | Wt 393.8 lb

## 2014-06-20 DIAGNOSIS — R0789 Other chest pain: Secondary | ICD-10-CM

## 2014-06-20 DIAGNOSIS — I491 Atrial premature depolarization: Secondary | ICD-10-CM

## 2014-06-20 DIAGNOSIS — R0602 Shortness of breath: Secondary | ICD-10-CM | POA: Diagnosis not present

## 2014-06-20 DIAGNOSIS — I10 Essential (primary) hypertension: Secondary | ICD-10-CM

## 2014-06-20 NOTE — Assessment & Plan Note (Signed)
Blood pressure is well controlled on current medications. 

## 2014-06-20 NOTE — Assessment & Plan Note (Signed)
She is asymptomatic. Current EKG does not show any PACs. No further testing is recommended. She can follow-up as needed.

## 2014-06-20 NOTE — Progress Notes (Signed)
Primary care physician: Dr. Ancil Boozer  HPI  This is a pleasant 68 year old African American female who is here today for a followup visit regarding PACs.  She reports being hospitalized in 2002 at [facility] for pericardial effusion which required drainage. She remembers having cardiac catheterization and was told that there was no significant coronary artery disease. There is questionable history of paroxysmal atrial fibrillation versus PACs. She has multiple chronic medical conditions that include type 2 diabetes, hypertension, chronic kidney disease and morbid obesity. There is no history of stroke or myocardial infarction. She is a lifelong nonsmoker and has no family history of premature coronary artery disease.  She was seen for chest pain in the setting of gastroenteritis and uncontrolled hypertension. EKG showed frequent PACs and  ectopic atrial rhythm.  An echocardiogram  showed normal LV systolic function, mildly dilated left atrium, borderline pulmonary hypertension and trivial pericardial effusion. She has been doing very well and denies any chest pain, shortness of breath or palpitations.   No Known Allergies   Current Outpatient Prescriptions on File Prior to Visit  Medication Sig Dispense Refill  . albuterol (PROVENTIL HFA;VENTOLIN HFA) 108 (90 BASE) MCG/ACT inhaler Inhale 2 puffs into the lungs every 6 (six) hours as needed for wheezing or shortness of breath.      Marland Kitchen aspirin 81 MG tablet Take 1 tablet (81 mg total) by mouth daily.  30 tablet    . Cholecalciferol (VITAMIN D) 2000 UNITS tablet Take 2,000 Units by mouth daily.      . colchicine (COLCRYS) 0.6 MG tablet Take 0.6 mg by mouth 2 (two) times daily as needed.      . diltiazem (CARDIZEM CD) 300 MG 24 hr capsule Take 300 mg by mouth daily.      . fluconazole (DIFLUCAN) 150 MG tablet Take 150 mg by mouth as needed.      . fluticasone (FLONASE) 50 MCG/ACT nasal spray Place 2 sprays into both nostrils daily.      .  Fluticasone-Salmeterol (ADVAIR) 250-50 MCG/DOSE AEPB Inhale 1 puff into the lungs 2 (two) times daily.      . furosemide (LASIX) 40 MG tablet Take 40 mg by mouth as needed.      . hydrALAZINE (APRESOLINE) 50 MG tablet Take 50 mg by mouth 3 (three) times daily.      . insulin aspart protamine- aspart (NOVOLOG MIX 70/30) (70-30) 100 UNIT/ML injection Inject 45-50 Units into the skin 2 (two) times daily with a meal.      . Insulin Pen Needle (NOVOFINE) 30G X 8 MM MISC Inject 1 packet into the skin daily.      . irbesartan (AVAPRO) 300 MG tablet Take 300 mg by mouth daily.      . potassium chloride SA (K-DUR,KLOR-CON) 20 MEQ tablet Take 20 mEq by mouth as needed.      . pravastatin (PRAVACHOL) 80 MG tablet Take 80 mg by mouth daily.      . promethazine (PHENERGAN) 12.5 MG tablet Take 12.5 mg by mouth every 6 (six) hours as needed for nausea or vomiting.       No current facility-administered medications on file prior to visit.     Past Medical History  Diagnosis Date  . Allergic rhinitis, cause unspecified   . Unspecified vitamin D deficiency   . Gout, unspecified   . Edema   . Sebaceous cyst   . Lipoma of unspecified site   . Vaginitis and vulvovaginitis, unspecified   . Other general symptoms(780.99)   .  Type II or unspecified type diabetes mellitus without mention of complication, uncontrolled   . Unspecified asthma(493.90)   . Other and unspecified disc disorder of lumbar region   . Unspecified sleep apnea   . Restless legs syndrome (RLS)   . Obesity, unspecified   . A-fib   . Unspecified disease of pericardium   . Chronic kidney disease, unspecified   . Heart murmur     as child  . Arrhythmia     afib  . Essential hypertension, benign   . Hyperlipidemia      Past Surgical History  Procedure Laterality Date  . Cardiac catheterization  2002    [facility]  . Pericardium surgery       Family History  Problem Relation Age of Onset  . Heart attack Mother   . Hypertension  Mother      History   Social History  . Marital Status: Married    Spouse Name: N/A    Number of Children: N/A  . Years of Education: N/A   Occupational History  . Not on file.   Social History Main Topics  . Smoking status: Never Smoker   . Smokeless tobacco: Not on file  . Alcohol Use: No  . Drug Use: No  . Sexual Activity: Not on file   Other Topics Concern  . Not on file   Social History Narrative  . No narrative on file     ROS A 10 point review of system was performed. It is negative other than that mentioned in the history of present illness.   PHYSICAL EXAM   BP 125/79  Pulse 84  Ht 5\' 9"  (1.753 m)  Wt 393 lb 12 oz (178.604 kg)  BMI 58.12 kg/m2 Constitutional: She is oriented to person, place, and time. She is morbidly obese. No distress.  HENT: No nasal discharge.  Head: Normocephalic and atraumatic.  Eyes: Pupils are equal and round. No discharge.  Neck: Normal range of motion. Neck supple. No JVD present. No thyromegaly present.  Cardiovascular: Normal rate, regular rhythm with frequent premature beats, normal heart sounds. Exam reveals no gallop and no friction rub. There is a 1/6 systolic ejection murmur at the aortic area.  Pulmonary/Chest: Effort normal and breath sounds normal. No stridor. No respiratory distress. She has no wheezes. She has no rales. She exhibits no tenderness.  Abdominal: Soft. Bowel sounds are normal. She exhibits no distension. There is no tenderness. There is no rebound and no guarding.  Musculoskeletal: Normal range of motion. She exhibits no edema and no tenderness.  Neurological: She is alert and oriented to person, place, and time. Coordination normal.  Skin: Skin is warm and dry. No rash noted. She is not diaphoretic. No erythema. No pallor.  Psychiatric: She has a normal mood and affect. Her behavior is normal. Judgment and thought content normal.     EKG: Sinus  Rhythm  -RSR(V1) -nondiagnostic.   -Nonspecific ST  depression   +   Nonspecific T-abnormality  -Nondiagnostic.   ABNORMAL     ASSESSMENT AND PLAN

## 2014-06-20 NOTE — Assessment & Plan Note (Signed)
She reports no further chest pain or shortness of breath.

## 2014-06-20 NOTE — Patient Instructions (Signed)
Continue same medications.   Follow up as needed.  

## 2014-07-24 DIAGNOSIS — H43819 Vitreous degeneration, unspecified eye: Secondary | ICD-10-CM | POA: Diagnosis not present

## 2014-08-02 ENCOUNTER — Encounter: Payer: Self-pay | Admitting: Gastroenterology

## 2014-08-02 DIAGNOSIS — Z23 Encounter for immunization: Secondary | ICD-10-CM | POA: Diagnosis not present

## 2014-08-02 DIAGNOSIS — E1165 Type 2 diabetes mellitus with hyperglycemia: Secondary | ICD-10-CM | POA: Diagnosis not present

## 2014-08-02 DIAGNOSIS — G4733 Obstructive sleep apnea (adult) (pediatric): Secondary | ICD-10-CM | POA: Diagnosis not present

## 2014-08-02 DIAGNOSIS — I10 Essential (primary) hypertension: Secondary | ICD-10-CM | POA: Diagnosis not present

## 2014-08-02 DIAGNOSIS — E1122 Type 2 diabetes mellitus with diabetic chronic kidney disease: Secondary | ICD-10-CM | POA: Diagnosis not present

## 2014-08-02 DIAGNOSIS — N183 Chronic kidney disease, stage 3 (moderate): Secondary | ICD-10-CM | POA: Diagnosis not present

## 2014-08-02 DIAGNOSIS — I4891 Unspecified atrial fibrillation: Secondary | ICD-10-CM | POA: Diagnosis not present

## 2014-08-02 DIAGNOSIS — E785 Hyperlipidemia, unspecified: Secondary | ICD-10-CM | POA: Diagnosis not present

## 2014-08-04 LAB — LIPID PANEL
CHOLESTEROL: 150 mg/dL (ref 0–200)
HDL: 56 mg/dL (ref 35–70)
LDL Cholesterol: 71 mg/dL
TRIGLYCERIDES: 113 mg/dL (ref 40–160)

## 2014-09-04 ENCOUNTER — Telehealth: Payer: Self-pay | Admitting: *Deleted

## 2014-09-04 NOTE — Telephone Encounter (Signed)
Spoke with Amber Reyes and explained that Dr. Fuller Plan wants her to have an OV before her colonoscopy and that she will need to be done at [facility].    Understanding voiced.  She states she wishes to cancel procedure and will call back to set up appt

## 2014-09-04 NOTE — Telephone Encounter (Signed)
Office visit please

## 2014-09-04 NOTE — Telephone Encounter (Signed)
Dr. Fuller Plan,  This pt is coming in for her PV on 09-09-14.  While getting her chart ready, noted her BMI is 58.12, weight is 393. She does have several medical problems as well.  Would you like an OV with your or extender first or is a direct to the hospital ok?  Thanks, [employer]

## 2014-09-23 ENCOUNTER — Encounter: Payer: Medicare Other | Admitting: Gastroenterology

## 2014-10-10 DIAGNOSIS — H43819 Vitreous degeneration, unspecified eye: Secondary | ICD-10-CM | POA: Diagnosis not present

## 2015-02-17 ENCOUNTER — Other Ambulatory Visit: Payer: Self-pay | Admitting: Family Medicine

## 2015-02-20 DIAGNOSIS — H43813 Vitreous degeneration, bilateral: Secondary | ICD-10-CM | POA: Diagnosis not present

## 2015-03-24 ENCOUNTER — Telehealth: Payer: Self-pay | Admitting: Family Medicine

## 2015-03-24 NOTE — Telephone Encounter (Signed)
Pt is requesting a letter to be relieved from jury duty due to her disability.  Pt needs letter by 03/31/15

## 2015-03-28 NOTE — Telephone Encounter (Signed)
Patient notified

## 2015-03-28 NOTE — Telephone Encounter (Signed)
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

## 2015-04-12 ENCOUNTER — Encounter: Payer: Self-pay | Admitting: Family Medicine

## 2015-04-12 DIAGNOSIS — G4733 Obstructive sleep apnea (adult) (pediatric): Secondary | ICD-10-CM | POA: Insufficient documentation

## 2015-04-12 DIAGNOSIS — G2581 Restless legs syndrome: Secondary | ICD-10-CM | POA: Insufficient documentation

## 2015-04-12 DIAGNOSIS — N1832 Chronic kidney disease, stage 3b: Secondary | ICD-10-CM | POA: Insufficient documentation

## 2015-04-12 DIAGNOSIS — N183 Chronic kidney disease, stage 3 unspecified: Secondary | ICD-10-CM | POA: Insufficient documentation

## 2015-04-12 DIAGNOSIS — J4540 Moderate persistent asthma, uncomplicated: Secondary | ICD-10-CM | POA: Insufficient documentation

## 2015-04-12 DIAGNOSIS — F419 Anxiety disorder, unspecified: Secondary | ICD-10-CM | POA: Insufficient documentation

## 2015-04-12 DIAGNOSIS — M109 Gout, unspecified: Secondary | ICD-10-CM | POA: Insufficient documentation

## 2015-04-12 DIAGNOSIS — R600 Localized edema: Secondary | ICD-10-CM | POA: Insufficient documentation

## 2015-04-12 DIAGNOSIS — J309 Allergic rhinitis, unspecified: Secondary | ICD-10-CM | POA: Insufficient documentation

## 2015-04-12 DIAGNOSIS — M519 Unspecified thoracic, thoracolumbar and lumbosacral intervertebral disc disorder: Secondary | ICD-10-CM | POA: Insufficient documentation

## 2015-04-12 DIAGNOSIS — E559 Vitamin D deficiency, unspecified: Secondary | ICD-10-CM | POA: Insufficient documentation

## 2015-04-13 ENCOUNTER — Other Ambulatory Visit: Payer: Self-pay | Admitting: Family Medicine

## 2015-04-15 ENCOUNTER — Encounter: Payer: Self-pay | Admitting: Family Medicine

## 2015-04-15 ENCOUNTER — Ambulatory Visit (INDEPENDENT_AMBULATORY_CARE_PROVIDER_SITE_OTHER): Payer: Medicare Other | Admitting: Family Medicine

## 2015-04-15 VITALS — BP 136/70 | HR 96 | Temp 98.0°F | Resp 16 | Ht 69.0 in | Wt >= 6400 oz

## 2015-04-15 DIAGNOSIS — G4733 Obstructive sleep apnea (adult) (pediatric): Secondary | ICD-10-CM | POA: Diagnosis not present

## 2015-04-15 DIAGNOSIS — J4540 Moderate persistent asthma, uncomplicated: Secondary | ICD-10-CM

## 2015-04-15 DIAGNOSIS — E1165 Type 2 diabetes mellitus with hyperglycemia: Secondary | ICD-10-CM

## 2015-04-15 DIAGNOSIS — H0260 Xanthelasma of unspecified eye, unspecified eyelid: Secondary | ICD-10-CM | POA: Diagnosis not present

## 2015-04-15 DIAGNOSIS — E785 Hyperlipidemia, unspecified: Secondary | ICD-10-CM | POA: Diagnosis not present

## 2015-04-15 DIAGNOSIS — IMO0002 Reserved for concepts with insufficient information to code with codable children: Secondary | ICD-10-CM

## 2015-04-15 DIAGNOSIS — E1129 Type 2 diabetes mellitus with other diabetic kidney complication: Secondary | ICD-10-CM

## 2015-04-15 DIAGNOSIS — N183 Chronic kidney disease, stage 3 unspecified: Secondary | ICD-10-CM

## 2015-04-15 DIAGNOSIS — I10 Essential (primary) hypertension: Secondary | ICD-10-CM

## 2015-04-15 DIAGNOSIS — G2581 Restless legs syndrome: Secondary | ICD-10-CM

## 2015-04-15 DIAGNOSIS — M109 Gout, unspecified: Secondary | ICD-10-CM | POA: Diagnosis not present

## 2015-04-15 DIAGNOSIS — E559 Vitamin D deficiency, unspecified: Secondary | ICD-10-CM

## 2015-04-15 DIAGNOSIS — Z23 Encounter for immunization: Secondary | ICD-10-CM | POA: Diagnosis not present

## 2015-04-15 LAB — POCT GLYCOSYLATED HEMOGLOBIN (HGB A1C): Hemoglobin A1C: 7.1

## 2015-04-15 MED ORDER — DILTIAZEM HCL ER COATED BEADS 300 MG PO CP24: 300.0000 mg | ORAL_CAPSULE | Freq: Every day | ORAL | Status: DC

## 2015-04-15 MED ORDER — INSULIN ASPART PROT & ASPART (70-30 MIX) 100 UNIT/ML ~~LOC~~ SUSP: 60.0000 [IU] | Freq: Every day | SUBCUTANEOUS | Status: DC

## 2015-04-15 MED ORDER — ATORVASTATIN CALCIUM 80 MG PO TABS: 80.0000 mg | ORAL_TABLET | Freq: Every day | ORAL | Status: DC

## 2015-04-15 MED ORDER — IRBESARTAN 300 MG PO TABS: 300.0000 mg | ORAL_TABLET | Freq: Every day | ORAL | Status: DC

## 2015-04-15 MED ORDER — ROPINIROLE HCL 0.5 MG PO TABS: 0.5000 mg | ORAL_TABLET | Freq: Three times a day (TID) | ORAL | Status: DC

## 2015-04-15 MED ORDER — HYDRALAZINE HCL 50 MG PO TABS: 50.0000 mg | ORAL_TABLET | Freq: Three times a day (TID) | ORAL | Status: DC

## 2015-04-15 MED ORDER — POTASSIUM CHLORIDE ER 20 MEQ PO TBCR: 1.0000 | EXTENDED_RELEASE_TABLET | Freq: Every day | ORAL | Status: DC

## 2015-04-15 NOTE — Progress Notes (Signed)
   04/15/15 1030  Asthma History  Symptoms Daily  Nighttime Awakenings 0-2/month  Asthma interference with normal activity Some limitations  SABA use (not for EIB) > 2 days/wk--not > 1 x/day  Risk: Exacerbations requiring oral systemic steroids 0-1 / year  Asthma Severity Moderate Persistent
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
## 2015-04-16 DIAGNOSIS — M109 Gout, unspecified: Secondary | ICD-10-CM | POA: Diagnosis not present

## 2015-04-16 DIAGNOSIS — E559 Vitamin D deficiency, unspecified: Secondary | ICD-10-CM | POA: Diagnosis not present

## 2015-04-16 DIAGNOSIS — N183 Chronic kidney disease, stage 3 (moderate): Secondary | ICD-10-CM | POA: Diagnosis not present

## 2015-04-16 DIAGNOSIS — I10 Essential (primary) hypertension: Secondary | ICD-10-CM | POA: Diagnosis not present

## 2015-04-16 DIAGNOSIS — E785 Hyperlipidemia, unspecified: Secondary | ICD-10-CM | POA: Diagnosis not present

## 2015-04-17 LAB — COMPREHENSIVE METABOLIC PANEL
ALK PHOS: 92 IU/L (ref 39–117)
ALT: 6 IU/L (ref 0–32)
AST: 14 IU/L (ref 0–40)
Albumin/Globulin Ratio: 1.3 (ref 1.1–2.5)
Albumin: 3.8 g/dL (ref 3.6–4.8)
BUN / CREAT RATIO: 31 — AB (ref 11–26)
BUN: 40 mg/dL — ABNORMAL HIGH (ref 8–27)
Bilirubin Total: 0.3 mg/dL (ref 0.0–1.2)
CO2: 19 mmol/L (ref 18–29)
CREATININE: 1.29 mg/dL — AB (ref 0.57–1.00)
Calcium: 8.8 mg/dL (ref 8.7–10.3)
Chloride: 102 mmol/L (ref 97–108)
GFR calc Af Amer: 50 mL/min/{1.73_m2} — ABNORMAL LOW (ref >59)
GFR calc non Af Amer: 43 mL/min/{1.73_m2} — ABNORMAL LOW (ref >59)
GLOBULIN, TOTAL: 2.9 g/dL (ref 1.5–4.5)
Glucose: 147 mg/dL — ABNORMAL HIGH (ref 65–99)
POTASSIUM: 5.7 mmol/L — AB (ref 3.5–5.2)
SODIUM: 140 mmol/L (ref 134–144)
Total Protein: 6.7 g/dL (ref 6.0–8.5)

## 2015-04-17 LAB — CBC WITH DIFFERENTIAL/PLATELET
Basophils Absolute: 0.1 10*3/uL (ref 0.0–0.2)
Basos: 1 %
EOS (ABSOLUTE): 0.3 10*3/uL (ref 0.0–0.4)
EOS: 4 %
HEMATOCRIT: 33.9 % — AB (ref 34.0–46.6)
Hemoglobin: 11.1 g/dL (ref 11.1–15.9)
Immature Grans (Abs): 0 10*3/uL (ref 0.0–0.1)
Immature Granulocytes: 0 %
LYMPHS ABS: 1.9 10*3/uL (ref 0.7–3.1)
Lymphs: 23 %
MCH: 27.5 pg (ref 26.6–33.0)
MCHC: 32.7 g/dL (ref 31.5–35.7)
MCV: 84 fL (ref 79–97)
Monocytes Absolute: 0.5 10*3/uL (ref 0.1–0.9)
Monocytes: 7 %
NEUTROS PCT: 65 %
Neutrophils Absolute: 5.3 10*3/uL (ref 1.4–7.0)
PLATELETS: 231 10*3/uL (ref 150–379)
RBC: 4.04 x10E6/uL (ref 3.77–5.28)
RDW: 14.6 % (ref 12.3–15.4)
WBC: 8.1 10*3/uL (ref 3.4–10.8)

## 2015-04-17 LAB — LIPID PANEL
CHOL/HDL RATIO: 2.7 ratio (ref 0.0–4.4)
Cholesterol, Total: 139 mg/dL (ref 100–199)
HDL: 52 mg/dL (ref >39)
LDL Calculated: 70 mg/dL (ref 0–99)
Triglycerides: 83 mg/dL (ref 0–149)
VLDL Cholesterol Cal: 17 mg/dL (ref 5–40)

## 2015-04-17 LAB — URIC ACID: Uric Acid: 9.2 mg/dL — ABNORMAL HIGH (ref 2.5–7.1)

## 2015-04-17 LAB — VITAMIN D 25 HYDROXY (VIT D DEFICIENCY, FRACTURES): Vit D, 25-Hydroxy: 32.2 ng/mL (ref 30.0–100.0)

## 2015-04-20 ENCOUNTER — Other Ambulatory Visit: Payer: Self-pay | Admitting: Family Medicine

## 2015-04-20 DIAGNOSIS — E875 Hyperkalemia: Secondary | ICD-10-CM

## 2015-04-20 MED ORDER — ALLOPURINOL 100 MG PO TABS: 100.0000 mg | ORAL_TABLET | Freq: Two times a day (BID) | ORAL | Status: DC

## 2015-04-21 NOTE — Progress Notes (Signed)
Patient notified and mailed patient a copy of her lab slip.

## 2015-04-24 ENCOUNTER — Other Ambulatory Visit: Payer: Self-pay | Admitting: Family Medicine

## 2015-04-24 DIAGNOSIS — E875 Hyperkalemia: Secondary | ICD-10-CM | POA: Diagnosis not present

## 2015-04-25 ENCOUNTER — Other Ambulatory Visit: Payer: Self-pay | Admitting: Family Medicine

## 2015-04-25 DIAGNOSIS — E875 Hyperkalemia: Secondary | ICD-10-CM

## 2015-04-25 LAB — POTASSIUM: Potassium: 5.7 mmol/L — ABNORMAL HIGH (ref 3.5–5.2)

## 2015-04-25 NOTE — Progress Notes (Signed)
Patient notified and mail lab slip to be done in 1 week, once stopped Potassium supplements.

## 2015-05-06 ENCOUNTER — Other Ambulatory Visit: Payer: Self-pay | Admitting: Family Medicine

## 2015-05-06 DIAGNOSIS — E875 Hyperkalemia: Secondary | ICD-10-CM | POA: Diagnosis not present

## 2015-05-07 LAB — POTASSIUM: Potassium: 5.3 mmol/L — ABNORMAL HIGH (ref 3.5–5.2)

## 2015-05-07 NOTE — Progress Notes (Signed)
Left voice mail

## 2015-06-16 DIAGNOSIS — E113393 Type 2 diabetes mellitus with moderate nonproliferative diabetic retinopathy without macular edema, bilateral: Secondary | ICD-10-CM | POA: Diagnosis not present

## 2015-06-23 ENCOUNTER — Other Ambulatory Visit: Payer: Self-pay | Admitting: Family Medicine

## 2015-06-23 NOTE — Telephone Encounter (Signed)
Patient requesting refill. 

## 2015-06-28 ENCOUNTER — Other Ambulatory Visit: Payer: Self-pay | Admitting: Family Medicine

## 2015-08-15 ENCOUNTER — Ambulatory Visit (INDEPENDENT_AMBULATORY_CARE_PROVIDER_SITE_OTHER): Payer: Medicare Other | Admitting: Family Medicine

## 2015-08-15 ENCOUNTER — Encounter: Payer: Self-pay | Admitting: Family Medicine

## 2015-08-15 VITALS — BP 130/68 | HR 110 | Temp 98.0°F | Resp 18 | Ht 69.0 in | Wt >= 6400 oz

## 2015-08-15 DIAGNOSIS — IMO0002 Reserved for concepts with insufficient information to code with codable children: Secondary | ICD-10-CM

## 2015-08-15 DIAGNOSIS — E785 Hyperlipidemia, unspecified: Secondary | ICD-10-CM | POA: Diagnosis not present

## 2015-08-15 DIAGNOSIS — Z794 Long term (current) use of insulin: Secondary | ICD-10-CM

## 2015-08-15 DIAGNOSIS — E1165 Type 2 diabetes mellitus with hyperglycemia: Secondary | ICD-10-CM

## 2015-08-15 DIAGNOSIS — N183 Chronic kidney disease, stage 3 unspecified: Secondary | ICD-10-CM

## 2015-08-15 DIAGNOSIS — G2581 Restless legs syndrome: Secondary | ICD-10-CM

## 2015-08-15 DIAGNOSIS — E1122 Type 2 diabetes mellitus with diabetic chronic kidney disease: Secondary | ICD-10-CM | POA: Diagnosis not present

## 2015-08-15 DIAGNOSIS — I4891 Unspecified atrial fibrillation: Secondary | ICD-10-CM

## 2015-08-15 DIAGNOSIS — I10 Essential (primary) hypertension: Secondary | ICD-10-CM | POA: Diagnosis not present

## 2015-08-15 DIAGNOSIS — M109 Gout, unspecified: Secondary | ICD-10-CM | POA: Diagnosis not present

## 2015-08-15 DIAGNOSIS — G4733 Obstructive sleep apnea (adult) (pediatric): Secondary | ICD-10-CM

## 2015-08-15 LAB — POCT GLYCOSYLATED HEMOGLOBIN (HGB A1C): HEMOGLOBIN A1C: 7.2

## 2015-08-15 LAB — POCT UA - MICROALBUMIN: MICROALBUMIN (UR) POC: 20 mg/L

## 2015-08-15 MED ORDER — DILTIAZEM HCL ER COATED BEADS 300 MG PO CP24: 300.0000 mg | ORAL_CAPSULE | Freq: Every day | ORAL | Status: DC

## 2015-08-15 MED ORDER — PRAVASTATIN SODIUM 80 MG PO TABS: 80.0000 mg | ORAL_TABLET | Freq: Every day | ORAL | Status: DC

## 2015-08-15 MED ORDER — IRBESARTAN 300 MG PO TABS: 300.0000 mg | ORAL_TABLET | Freq: Every day | ORAL | Status: DC

## 2015-08-15 MED ORDER — INSULIN ASPART PROT & ASPART (70-30 MIX) 100 UNIT/ML PEN
PEN_INJECTOR | SUBCUTANEOUS | Status: DC
Start: 2015-08-15 — End: 2015-11-17

## 2015-08-15 MED ORDER — HYDRALAZINE HCL 50 MG PO TABS: 50.0000 mg | ORAL_TABLET | Freq: Three times a day (TID) | ORAL | Status: DC

## 2015-08-15 MED ORDER — FUROSEMIDE 40 MG PO TABS: 40.0000 mg | ORAL_TABLET | ORAL | Status: DC | PRN

## 2015-08-15 MED ORDER — ALLOPURINOL 100 MG PO TABS: 100.0000 mg | ORAL_TABLET | Freq: Two times a day (BID) | ORAL | Status: DC

## 2015-08-15 MED ORDER — ROPINIROLE HCL 0.5 MG PO TABS: 0.5000 mg | ORAL_TABLET | Freq: Every day | ORAL | Status: DC

## 2015-08-15 NOTE — Progress Notes (Signed)
Name: Amber Reyes   MRN: [id number]    DOB: 02-13-1946   Date:08/15/2015       Progress Note  Subjective  Chief Complaint  Chief Complaint  Patient presents with  . Medication Refill    4 month F/U  . Diabetes    Checks twice daily, Low- 79 Average-120 High-160  . Hypertension  . Hyperlipidemia  . Asthma  . Gout    HPI  DMII uncontrolled:  She has been compliant with medication. FSB at home has been around 120's, high of 160 post-prandially. She checks it daily. Taking Avapro for nephropathy- urine micro today is normal. No polyphagia, polydipsia or polyuria. No recent episodes of hypoglycemia, one month ago she went too long without eating and glucose went down to 70's, but usually does not happen.   HTN: taking medications and denies side effects of medications, bp at home is usually normal , below 140/90. She has not been wearing CPAP machine for the past couple of months because mask is not fitting her face well .   Hyperlipidemia: taking Pravastatin, last LDL was at goal, we will continue current medication   RLS: symptoms are still present,  described as soreness on both legs at night, improves when she rubs her legs. She was given prescription of Requip on her last visit, but she states it was not at the pharmacy when she went to pick it up   [place]: she used to be seen at [facility] but has not been back in many months. Denies pruritus or decrease in urinary volume. Last GFR had improved  OSA: stopped wearing CPAP a few months ago because her mask is not fitting well.  Morbid Obesity: she lost a few pounds since last visit. She is eating better now but still not active  Asthma Moderate persistent: one flare over the past month, increased rescue inhaler , continue the Advair and at this time she states she coughs in the am's, no wheezing, no nocturnal symptoms.   Atrial Fibrillation: on Cardizem CD, no palpitation, no SOB, no chest pain, taking aspirin. Seen by Dr. Fletcher Anon in  2015 and on his notes states questionable paroxysmal afib, she is not on anticoagulant therapy at this time. Discussed possible symptoms of afib  Patient Active Problem List   Diagnosis Date Noted  . 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 Atrial fibrillation, controlled ([place]) 04/12/2015  . Anxiety 04/12/2015  . Chronic kidney disease (CKD), stage III (moderate) 04/12/2015  . Edema extremities 04/12/2015  . 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 Asthma, moderate persistent 04/12/2015  . Morbid obesity ([place]) 04/12/2015  . Obstructive apnea 04/12/2015  . Restless leg 04/12/2015  . Allergic rhinitis 04/12/2015  . Vitamin D deficiency 04/12/2015  . Controlled gout 04/12/2015  . Premature atrial contractions 11/29/2013  . Essential hypertension, benign   . Hyperlipidemia     Past Surgical History  Procedure Laterality Date  . Cardiac catheterization  2002    [facility]  . Pericardium surgery      Family History  Problem Relation Age of Onset  . Heart attack Mother   . Hypertension Mother     Social History   Social History  . Marital Status: Married    Spouse Name: N/A  . Number of Children: N/A  . Years of Education: N/A   Occupational History  . Not on file.   Social History Main Topics  . Smoking status: Never Smoker   . Smokeless tobacco: Never Used  . Alcohol  Use: No  . Drug Use: No  . Sexual Activity: Not Currently   Other Topics Concern  . Not on file   Social History Narrative     Current outpatient prescriptions:  .  albuterol (PROVENTIL HFA;VENTOLIN HFA) 108 (90 BASE) MCG/ACT inhaler, Inhale 2 puffs into the lungs every 6 (six) hours as needed for wheezing or shortness of breath., Disp: , Rfl:  .  allopurinol (ZYLOPRIM) 100 MG tablet, Take 1 tablet (100 mg total) by mouth 2 (two) times daily., Disp: 180 tablet, Rfl: 1 .  aspirin 81 MG tablet, Take 1 tablet (81 mg total) by mouth daily., Disp: 30 tablet, Rfl:  .  Cholecalciferol (VITAMIN D) 2000 UNITS tablet,  Take 2,000 Units by mouth daily., Disp: , Rfl:  .  colchicine (COLCRYS) 0.6 MG tablet, Take 0.6 mg by mouth 2 (two) times daily as needed., Disp: , Rfl:  .  diltiazem (CARDIZEM CD) 300 MG 24 hr capsule, Take 1 capsule (300 mg total) by mouth daily., Disp: 90 capsule, Rfl: 1 .  fluticasone (FLONASE) 50 MCG/ACT nasal spray, USE TWO SPRAY(S) IN EACH NOSTRIL AT BEDTIME, Disp: 16 g, Rfl: 0 .  Fluticasone-Salmeterol (ADVAIR) 250-50 MCG/DOSE AEPB, Inhale 1 puff into the lungs 2 (two) times daily., Disp: , Rfl:  .  furosemide (LASIX) 40 MG tablet, Take 1 tablet (40 mg total) by mouth as needed., Disp: 90 tablet, Rfl: 1 .  hydrALAZINE (APRESOLINE) 50 MG tablet, Take 1 tablet (50 mg total) by mouth 3 (three) times daily., Disp: 270 tablet, Rfl: 1 .  insulin aspart protamine - aspart (NOVOLOG MIX 70/30 FLEXPEN) (70-30) 100 UNIT/ML FlexPen, INJECT 60 UNITS SUBCUTANEOUSLY IN THE MORNING AND INJECT 40 UNITS SUBCUTANEOUSLY BEFORE DINNER, Disp: 15 mL, Rfl: 3 .  Insulin Pen Needle (NOVOFINE) 30G X 8 MM MISC, Inject 1 packet into the skin daily., Disp: , Rfl:  .  irbesartan (AVAPRO) 300 MG tablet, Take 1 tablet (300 mg total) by mouth daily., Disp: 90 tablet, Rfl: 1 .  pravastatin (PRAVACHOL) 80 MG tablet, Take 1 tablet (80 mg total) by mouth daily., Disp: 90 tablet, Rfl: 1 .  rOPINIRole (REQUIP) 0.5 MG tablet, Take 1 tablet (0.5 mg total) by mouth at bedtime., Disp: 90 tablet, Rfl: 1  Allergies  Allergen Reactions  . Ace Inhibitors      ROS  Constitutional: Negative for fever or significant  weight change.  Respiratory: Positive  for cough and shortness of breath with activity .   Cardiovascular: Negative for chest pain or palpitations.  Gastrointestinal: Negative for abdominal pain, no bowel changes.  Musculoskeletal: Negative for gait problem or joint swelling.  Skin: Negative for rash.  Neurological: Negative for dizziness or headache.  No other specific complaints in a complete review of systems  (except as listed in HPI above).  Objective  Filed Vitals:   08/15/15 0807  BP: 144/58  Pulse: 110  Temp: 98 F (36.7 C)  TempSrc: Oral  Resp: 18  Height: 5\' 9"  (1.753 m)  Weight: 400 lb 9.6 oz (181.711 kg)  SpO2: 97%    Body mass index is 59.13 kg/(m^2).  Physical Exam  Constitutional: Patient appears well-developed . Morbid  Obese No distress.  HEENT: head atraumatic, normocephalic, pupils equal and reactive to light, neck supple, throat within normal limits Cardiovascular: Normal rate, regular rhythm and normal heart sounds. No murmur heard. 1 plus pitting BLE edema. Pulmonary/Chest: Effort normal and breath sounds normal. No respiratory distress. Abdominal: Soft. There is no tenderness. Psychiatric: Patient  has a normal mood and affect. behavior is normal. Judgment and thought content normal. Skin: fatty deposits on bridge of nose  Recent Results (from the past 2160 hour(s))  POCT HgB A1C     Status: None   Collection Time: 08/15/15  8:12 AM  Result Value Ref Range   Hemoglobin A1C 7.2   POCT UA - Microalbumin     Status: None   Collection Time: 08/15/15  8:12 AM  Result Value Ref Range   Microalbumin Ur, POC 20 mg/L   Creatinine, POC  mg/dL   Albumin/Creatinine Ratio, Urine, POC       PHQ2/9: Depression screen [facility] 2/9 08/15/2015 04/15/2015  Decreased Interest 0 0  Down, Depressed, Hopeless 0 0  PHQ - 2 Score 0 0     Fall Risk: Fall Risk  08/15/2015 04/15/2015  Falls in the past year? No No    Functional Status Survey: Is the patient deaf or have difficulty hearing?: No Does the patient have difficulty seeing, even when wearing glasses/contacts?: Yes (glasses) Does the patient have difficulty concentrating, remembering, or making decisions?: No Does the patient have difficulty walking or climbing stairs?: No Does the patient have difficulty dressing or bathing?: No Does the patient have difficulty doing errands alone such as visiting a doctor's  office or shopping?: No    Assessment & Plan  1. Uncontrolled type 2 diabetes mellitus with stage 3 chronic kidney disease, with long-term current use of insulin (HCC)  Needs to eat better, try to get hgbA1C below 7 - POCT HgB A1C - POCT UA - Microalbumin - insulin aspart protamine - aspart (NOVOLOG MIX 70/30 FLEXPEN) (70-30) 100 UNIT/ML FlexPen; INJECT 60 UNITS SUBCUTANEOUSLY IN THE MORNING AND INJECT 40 UNITS SUBCUTANEOUSLY BEFORE DINNER  Dispense: 15 mL; Refill: 3  2. Chronic kidney disease (CKD), stage III (moderate)  - Basic metabolic panel; Future  3. Essential hypertension, benign  BP may be elevated because she has not been wearing CPAP machine, we will send order to [facility]. She was on potassium, but because of hyperkalemia she has been off, recheck labs - irbesartan (AVAPRO) 300 MG tablet; Take 1 tablet (300 mg total) by mouth daily.  Dispense: 90 tablet; Refill: 1 - hydrALAZINE (APRESOLINE) 50 MG tablet; Take 1 tablet (50 mg total) by mouth 3 (three) times daily.  Dispense: 270 tablet; Refill: 1 - furosemide (LASIX) 40 MG tablet; Take 1 tablet (40 mg total) by mouth as needed.  Dispense: 90 tablet; Refill: 1 - diltiazem (CARDIZEM CD) 300 MG 24 hr capsule; Take 1 capsule (300 mg total) by mouth daily.  Dispense: 90 capsule; Refill: 1 - Basic metabolic panel; Future  4. Atrial fibrillation, controlled ([place])  Seen by Dr. Fletcher Anon, on aspirin only, because diagnosis was questionable  5. Obstructive apnea  Needs new mask so she can resume wearing it nightly   6. Restless leg  Re-sent prescription  - rOPINIRole (REQUIP) 0.5 MG tablet; Take 1 tablet (0.5 mg total) by mouth at bedtime.  Dispense: 90 tablet; Refill: 1  7. Controlled gout  - allopurinol (ZYLOPRIM) 100 MG tablet; Take 1 tablet (100 mg total) by mouth 2 (two) times daily.  Dispense: 180 tablet; Refill: 1 - Uric acid  8. Hyperlipidemia  - pravastatin (PRAVACHOL) 80 MG tablet; Take 1 tablet (80 mg total) by  mouth daily.  Dispense: 90 tablet; Refill: 1

## 2015-08-16 LAB — URIC ACID: Uric Acid: 5 mg/dL (ref 2.5–7.1)

## 2015-09-02 DIAGNOSIS — Z1211 Encounter for screening for malignant neoplasm of colon: Secondary | ICD-10-CM | POA: Diagnosis not present

## 2015-09-02 DIAGNOSIS — Z1212 Encounter for screening for malignant neoplasm of rectum: Secondary | ICD-10-CM | POA: Diagnosis not present

## 2015-09-03 LAB — COLOGUARD: Cologuard: NEGATIVE

## 2015-09-07 ENCOUNTER — Other Ambulatory Visit: Payer: Self-pay | Admitting: Family Medicine

## 2015-09-10 ENCOUNTER — Telehealth: Payer: Self-pay | Admitting: Family Medicine

## 2015-09-10 MED ORDER — BUDESONIDE-FORMOTEROL FUMARATE 160-4.5 MCG/ACT IN AERO: 2.0000 | INHALATION_SPRAY | Freq: Two times a day (BID) | RESPIRATORY_TRACT | Status: DC | PRN

## 2015-09-10 NOTE — Telephone Encounter (Signed)
Changed to Symbicort

## 2015-09-10 NOTE — Telephone Encounter (Signed)
Advair prescription was $600 after insurance and she was not able to use copay card due to [employer]. Is there something different she can take over the counter? I asked her if the pharmacy suggested anything that could take the place of the Advair and they told her that she would have to speak with her doctor because there is not another generic brand for this prescription.

## 2015-09-11 ENCOUNTER — Encounter: Payer: Self-pay | Admitting: Family Medicine

## 2015-09-18 ENCOUNTER — Telehealth: Payer: Self-pay

## 2015-09-18 DIAGNOSIS — G4733 Obstructive sleep apnea (adult) (pediatric): Secondary | ICD-10-CM

## 2015-09-18 NOTE — Telephone Encounter (Signed)
Got a fax from Phil Dopp with [facility] stating:  Due to patient being in Medicare since 2005 and [place] never billing Medicare for equipment patient does not fit into any Medicare scenarios. In order to qualify patient, we will need a new diagnostic study (this can be a home sleep test) and the patient will have to get new equipment.  Test was ordered.  Patient ID: [id number]  [facility] [address], [place], [place] [address] Phone: [phone]  Fax:[phone]

## 2015-09-19 NOTE — Telephone Encounter (Signed)
Thank you . Let me know if you need to sign it.

## 2015-10-11 ENCOUNTER — Other Ambulatory Visit: Payer: Self-pay | Admitting: Family Medicine

## 2015-11-17 ENCOUNTER — Encounter: Payer: Self-pay | Admitting: Family Medicine

## 2015-11-17 ENCOUNTER — Ambulatory Visit (INDEPENDENT_AMBULATORY_CARE_PROVIDER_SITE_OTHER): Payer: Medicare Other | Admitting: Family Medicine

## 2015-11-17 VITALS — BP 134/62 | HR 74 | Temp 98.1°F | Resp 16 | Ht 69.0 in | Wt 398.8 lb

## 2015-11-17 DIAGNOSIS — E1122 Type 2 diabetes mellitus with diabetic chronic kidney disease: Secondary | ICD-10-CM

## 2015-11-17 DIAGNOSIS — E1165 Type 2 diabetes mellitus with hyperglycemia: Secondary | ICD-10-CM | POA: Diagnosis not present

## 2015-11-17 DIAGNOSIS — Z794 Long term (current) use of insulin: Secondary | ICD-10-CM

## 2015-11-17 DIAGNOSIS — J069 Acute upper respiratory infection, unspecified: Secondary | ICD-10-CM

## 2015-11-17 DIAGNOSIS — J4541 Moderate persistent asthma with (acute) exacerbation: Secondary | ICD-10-CM

## 2015-11-17 DIAGNOSIS — N183 Chronic kidney disease, stage 3 (moderate): Secondary | ICD-10-CM

## 2015-11-17 DIAGNOSIS — IMO0002 Reserved for concepts with insufficient information to code with codable children: Secondary | ICD-10-CM

## 2015-11-17 MED ORDER — INSULIN LISPRO PROT & LISPRO (75-25 MIX) 100 UNIT/ML KWIKPEN: 40.0000 [IU] | PEN_INJECTOR | Freq: Two times a day (BID) | SUBCUTANEOUS | Status: DC

## 2015-11-17 MED ORDER — FLUTICASONE FUROATE-VILANTEROL 100-25 MCG/INH IN AEPB: 1.0000 | INHALATION_SPRAY | Freq: Every day | RESPIRATORY_TRACT | Status: DC

## 2015-11-17 NOTE — Progress Notes (Signed)
Name: Amber Reyes   MRN: [id number]    DOB: June 07, 1946   Date:11/17/2015       Progress Note  Subjective  Chief Complaint  Chief Complaint  Patient presents with  . URI    Onset-1 week, patient has been cough with clear mucus, ear pressure with thubbing, wheezing, and states symptoms have improved.  But would like to still get checked out.   . Gout    Colcrys is very expensive and needs to talk about cheaper options  . Diabetes    Humalog 75/25 is a cheaper option, due to Novolog is 330 for 15 day supply due to patient having to meet deductible      HPI  URI: she developed cold symptoms a few weeks ago. Symptoms were initially clear rhinorrhea, ear pressure, cough. She took some otc medication and nose symptoms improved, but went down to her chest. She states last week she was having a productive cough, wheezing, increase in SOB, but no fever. She went from Symbicort that she had at home to Advair ( prescription that she used to take ) and symptoms have improved since. Today she states no wheezing, she had to clear her throat this am, but no cough since.   Gout: rx of Colcrys is too expensive but only taking it prn, advised to only get a few pills instead of a 30 day supply  DMII: trying to change her insurance coverage. Novolog mix is too expensive, over $300,00, she states she wants to try Humalog mix instead. She has been still using her insulin. FSBS at home has been around 130's fasting. She has a follow up in one month for DM, advised to continue a diabetic diet   Patient Active Problem List   Diagnosis Date Noted  . 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 Atrial fibrillation, controlled ([place]) 04/12/2015  . Anxiety 04/12/2015  . Chronic kidney disease (CKD), stage III (moderate) 04/12/2015  . Edema extremities 04/12/2015  . 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 Asthma, moderate persistent 04/12/2015  . Morbid obesity ([place]) 04/12/2015  . Obstructive apnea 04/12/2015  .  Restless leg 04/12/2015  . Allergic rhinitis 04/12/2015  . Vitamin D deficiency 04/12/2015  . Controlled gout 04/12/2015  . Premature atrial contractions 11/29/2013  . Essential hypertension, benign   . Hyperlipidemia     Past Surgical History  Procedure Laterality Date  . Cardiac catheterization  2002    [facility]  . Pericardium surgery      Family History  Problem Relation Age of Onset  . Heart attack Mother   . Hypertension Mother     Social History   Social History  . Marital Status: Married    Spouse Name: N/A  . Number of Children: N/A  . Years of Education: N/A   Occupational History  . Not on file.   Social History Main Topics  . Smoking status: Never Smoker   . Smokeless tobacco: Never Used  . Alcohol Use: No  . Drug Use: No  . Sexual Activity: Not Currently   Other Topics Concern  . Not on file   Social History Narrative     Current outpatient prescriptions:  .  albuterol (PROVENTIL HFA;VENTOLIN HFA) 108 (90 BASE) MCG/ACT inhaler, Inhale 2 puffs into the lungs every 6 (six) hours as needed for wheezing or shortness of breath., Disp: , Rfl:  .  allopurinol (ZYLOPRIM) 100 MG tablet, Take 1 tablet (100 mg total) by mouth 2 (two) times daily., Disp: 180  tablet, Rfl: 1 .  aspirin 81 MG tablet, Take 1 tablet (81 mg total) by mouth daily., Disp: 30 tablet, Rfl:  .  Cholecalciferol (VITAMIN D) 2000 UNITS tablet, Take 2,000 Units by mouth daily., Disp: , Rfl:  .  COLCRYS 0.6 MG tablet, TAKE ONE TABLET BY MOUTH TWICE DAILY AS NEEDED FOR GOUT, Disp: 30 tablet, Rfl: 5 .  diltiazem (CARDIZEM CD) 300 MG 24 hr capsule, Take 1 capsule (300 mg total) by mouth daily., Disp: 90 capsule, Rfl: 1 .  fluticasone (FLONASE) 50 MCG/ACT nasal spray, USE TWO SPRAY(S) IN EACH NOSTRIL AT BEDTIME, Disp: 16 g, Rfl: 0 .  furosemide (LASIX) 40 MG tablet, Take 1 tablet (40 mg total) by mouth as needed., Disp: 90 tablet, Rfl: 1 .  hydrALAZINE (APRESOLINE) 50 MG tablet, Take 1 tablet (50 mg  total) by mouth 3 (three) times daily., Disp: 270 tablet, Rfl: 1 .  Insulin Pen Needle (NOVOFINE) 30G X 8 MM MISC, Inject 1 packet into the skin daily., Disp: , Rfl:  .  irbesartan (AVAPRO) 300 MG tablet, Take 1 tablet (300 mg total) by mouth daily., Disp: 90 tablet, Rfl: 1 .  pravastatin (PRAVACHOL) 80 MG tablet, Take 1 tablet (80 mg total) by mouth daily., Disp: 90 tablet, Rfl: 1 .  rOPINIRole (REQUIP) 0.5 MG tablet, Take 1 tablet (0.5 mg total) by mouth at bedtime., Disp: 90 tablet, Rfl: 1 .  fluticasone furoate-vilanterol (BREO ELLIPTA) 100-25 MCG/INH AEPB, Inhale 1 puff into the lungs daily., Disp: 60 each, Rfl: 2 .  Insulin Lispro Prot & Lispro (HUMALOG MIX 75/25 KWIKPEN) (75-25) 100 UNIT/ML Kwikpen, Inject 40-60 Units into the skin 2 (two) times daily., Disp: 15 mL, Rfl: 2  Allergies  Allergen Reactions  . Ace Inhibitors      ROS  Constitutional: Negative for fever or significant  weight change.  Respiratory: Positive  for cough , no  shortness of breath.   Cardiovascular: Negative for chest pain or palpitations.  Gastrointestinal: Negative for abdominal pain, no bowel changes.  Musculoskeletal: Negative for gait problem or joint swelling.  Skin: Negative for rash.  Neurological: Negative for dizziness or headache.  No other specific complaints in a complete review of systems (except as listed in HPI above). Objective  Filed Vitals:   11/17/15 0825  BP: 134/62  Pulse: 74  Temp: 98.1 F (36.7 C)  TempSrc: Oral  Resp: 16  Height: 5\' 9"  (1.753 m)  Weight: 398 lb 12.8 oz (180.894 kg)  SpO2: 97%    Body mass index is 58.87 kg/(m^2).  Physical Exam  Constitutional: Patient appears well-developed and well-nourished. Obese No distress.  HEENT: head atraumatic, normocephalic, pupils equal and reactive to light,, neck supple, throat within normal limits Cardiovascular: Normal rate, regular rhythm and normal heart sounds.  No murmur heard. Trace  BLE edema. Pulmonary/Chest:  Effort normal and breath sounds normal. No respiratory distress. Abdominal: Soft.  There is no tenderness. Psychiatric: Patient has a normal mood and affect. behavior is normal. Judgment and thought content normal.  Recent Results (from the past 2160 hour(s))  Cologuard     Status: None   Collection Time: 09/02/15 12:00 AM  Result Value Ref Range   Cologuard Negative     Comment: results were released on 09/11/15 @ 7:07     PHQ2/9: Depression screen [facility] 2/9 11/17/2015 08/15/2015 04/15/2015  Decreased Interest 0 0 0  Down, Depressed, Hopeless 0 0 0  PHQ - 2 Score 0 0 0     Fall  Risk: Fall Risk  11/17/2015 08/15/2015 04/15/2015  Falls in the past year? No No No     Functional Status Survey: Is the patient deaf or have difficulty hearing?: No Does the patient have difficulty seeing, even when wearing glasses/contacts?: No Does the patient have difficulty concentrating, remembering, or making decisions?: No Does the patient have difficulty walking or climbing stairs?: No Does the patient have difficulty dressing or bathing?: No Does the patient have difficulty doing errands alone such as visiting a doctor's office or shopping?: No    Assessment & Plan  1. Asthma, moderate persistent, with acute exacerbation  Doing better, we will try Breo instead of Advair, since it seems to have better coverage per her insurance. Also advised Mucinex prn mucus.  - fluticasone furoate-vilanterol (BREO ELLIPTA) 100-25 MCG/INH AEPB; Inhale 1 puff into the lungs daily.  Dispense: 60 each; Refill: 2  2. Uncontrolled type 2 diabetes mellitus with stage 3 chronic kidney disease, with long-term current use of insulin (HCC)  We will try Humalog - Insulin Lispro Prot & Lispro (HUMALOG MIX 75/25 KWIKPEN) (75-25) 100 UNIT/ML Kwikpen; Inject 40-60 Units into the skin 2 (two) times daily.  Dispense: 15 mL; Refill: 2  3. Upper respiratory infection  Try Mucinex DM otc

## 2015-12-18 ENCOUNTER — Encounter: Payer: Self-pay | Admitting: Family Medicine

## 2015-12-18 ENCOUNTER — Ambulatory Visit (INDEPENDENT_AMBULATORY_CARE_PROVIDER_SITE_OTHER): Payer: Medicare Other | Admitting: Family Medicine

## 2015-12-18 VITALS — BP 132/60 | HR 103 | Temp 97.9°F | Resp 18 | Ht 69.0 in | Wt >= 6400 oz

## 2015-12-18 DIAGNOSIS — E1165 Type 2 diabetes mellitus with hyperglycemia: Secondary | ICD-10-CM | POA: Diagnosis not present

## 2015-12-18 DIAGNOSIS — G2581 Restless legs syndrome: Secondary | ICD-10-CM | POA: Diagnosis not present

## 2015-12-18 DIAGNOSIS — N183 Chronic kidney disease, stage 3 unspecified: Secondary | ICD-10-CM

## 2015-12-18 DIAGNOSIS — E1122 Type 2 diabetes mellitus with diabetic chronic kidney disease: Secondary | ICD-10-CM

## 2015-12-18 DIAGNOSIS — I10 Essential (primary) hypertension: Secondary | ICD-10-CM | POA: Diagnosis not present

## 2015-12-18 DIAGNOSIS — Z794 Long term (current) use of insulin: Secondary | ICD-10-CM | POA: Diagnosis not present

## 2015-12-18 DIAGNOSIS — M109 Gout, unspecified: Secondary | ICD-10-CM

## 2015-12-18 DIAGNOSIS — I4891 Unspecified atrial fibrillation: Secondary | ICD-10-CM

## 2015-12-18 DIAGNOSIS — G4733 Obstructive sleep apnea (adult) (pediatric): Secondary | ICD-10-CM | POA: Diagnosis not present

## 2015-12-18 DIAGNOSIS — D649 Anemia, unspecified: Secondary | ICD-10-CM

## 2015-12-18 DIAGNOSIS — I272 Other secondary pulmonary hypertension: Secondary | ICD-10-CM | POA: Diagnosis not present

## 2015-12-18 DIAGNOSIS — J4540 Moderate persistent asthma, uncomplicated: Secondary | ICD-10-CM

## 2015-12-18 DIAGNOSIS — IMO0002 Reserved for concepts with insufficient information to code with codable children: Secondary | ICD-10-CM

## 2015-12-18 DIAGNOSIS — E785 Hyperlipidemia, unspecified: Secondary | ICD-10-CM

## 2015-12-18 LAB — POCT GLYCOSYLATED HEMOGLOBIN (HGB A1C): Hemoglobin A1C: 7.6

## 2015-12-18 MED ORDER — HYDRALAZINE HCL 50 MG PO TABS: 50.0000 mg | ORAL_TABLET | Freq: Three times a day (TID) | ORAL | Status: DC

## 2015-12-18 MED ORDER — FUROSEMIDE 40 MG PO TABS
40.0000 mg | ORAL_TABLET | ORAL | Status: DC | PRN
Start: 2015-12-18 — End: 2016-04-20

## 2015-12-18 MED ORDER — ROPINIROLE HCL 0.5 MG PO TABS: 0.5000 mg | ORAL_TABLET | Freq: Every day | ORAL | Status: DC

## 2015-12-18 MED ORDER — ASPIRIN EC 325 MG PO TBEC: 325.0000 mg | DELAYED_RELEASE_TABLET | Freq: Every day | ORAL | Status: DC

## 2015-12-18 MED ORDER — ALLOPURINOL 100 MG PO TABS
100.0000 mg | ORAL_TABLET | Freq: Two times a day (BID) | ORAL | Status: DC
Start: 2015-12-18 — End: 2016-04-20

## 2015-12-18 MED ORDER — DILTIAZEM HCL ER COATED BEADS 300 MG PO CP24: 300.0000 mg | ORAL_CAPSULE | Freq: Every day | ORAL | Status: DC

## 2015-12-18 MED ORDER — PRAVASTATIN SODIUM 80 MG PO TABS
80.0000 mg | ORAL_TABLET | Freq: Every day | ORAL | Status: DC
Start: 2015-12-18 — End: 2016-04-20

## 2015-12-18 MED ORDER — IRBESARTAN 300 MG PO TABS: 300.0000 mg | ORAL_TABLET | Freq: Every day | ORAL | Status: DC

## 2015-12-18 MED ORDER — FLUTICASONE PROPIONATE 50 MCG/ACT NA SUSP: 2.0000 | Freq: Every day | NASAL | Status: DC

## 2015-12-18 NOTE — Progress Notes (Signed)
Name: Amber Reyes   MRN: [id number]    DOB: March 14, 1946   Date:12/18/2015       Progress Note  Subjective  Chief Complaint  Chief Complaint  Patient presents with  . Diabetes    patient is here for her 60-month f/u. checks bs daily. highest: 160 & lowest:under 100's  . Chronic Kidney Disease    stage III  . Hypertension    essential, benign  . Atrial Fibrillation    controlled  . Sleep Apnea    obstructive  . Gout    controlled  . Restless leg  . Hyperlipidemia    HPI    DMII uncontrolled: She has been compliant with medication, only Humalog 75/25 since Feb - insurance does not cover Novolog HgbA1C has gone up, but she would like to hold off on adjusting medication or dose until next check. FSB at home has been around 120's, low of 100.  high of 160 post-prandially. She checks it daily. Taking Avapro for nephropathy- urine micro was normal in Dec. No polyphagia, polydipsia or polyuria. No recent episodes of hypoglycemia  HTN: taking medications and denies side effects of medications, bp at home is usually normal , below 140/90.   Hyperlipidemia: taking Pravastatin, last LDL was at goal, we will continue current medication   RLS: symptoms are still present, described as soreness on both legs at night, improves when she rubs her legs. She states Requip has helped with symptoms   CKIII: she used to be seen at [facility] but has not been back in many months. Denies pruritus or decrease in urinary volume. Last GFR had improved, we will recheck labs  OSA: she has been wearing CPAP machine but only a few times weekly. Advised to wear it every night, she has mid pulmonary hypertension also , found on echo done in 2015 by Dr Fletcher Anon  Morbid Obesity: she lost a few pounds since last visit. She is eating better now but still not active  Asthma Moderate persistent: one flare back in Feb, ,still finishing the Advair she had at home,  She states symptoms are controlled at this time,  occasional cough and still has SOB with activity, but no wheezing. Discussed switching to nebulizer machine to decrease cost of medication once she runs out of the old Advair and also voucher for free Breo.   Atrial Fibrillation: on Cardizem CD, no palpitation, only SOB with activity,  no chest pain, taking aspirin but only 81 mg and advised to take 325 mg. She was given Eliquis back in 2015 but it was too expensive and no events since. She is not interested in anti-coagulation, discussed increased risk of strokes. . Seen by Dr. Fletcher Anon in 2015 and on his notes states questionable paroxysmal afib, she is not on anticoagulant therapy at this time. Discussed possible symptoms of afib  Patient Active Problem List   Diagnosis Date Noted  . 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 Atrial fibrillation, controlled ([place]) 04/12/2015  . Anxiety 04/12/2015  . Chronic kidney disease (CKD), stage III (moderate) 04/12/2015  . Edema extremities 04/12/2015  . 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 Asthma, moderate persistent 04/12/2015  . Morbid obesity ([place]) 04/12/2015  . Obstructive apnea 04/12/2015  . Restless leg 04/12/2015  . Allergic rhinitis 04/12/2015  . Vitamin D deficiency 04/12/2015  . Controlled gout 04/12/2015  . Premature atrial contractions 11/29/2013  . Essential hypertension, benign   . Hyperlipidemia     Past Surgical History  Procedure Laterality Date  .  Cardiac catheterization  2002    [facility]  . Pericardium surgery      Family History  Problem Relation Age of Onset  . Heart attack Mother   . Hypertension Mother     Social History   Social History  . Marital Status: Married    Spouse Name: N/A  . Number of Children: N/A  . Years of Education: N/A   Occupational History  . Not on file.   Social History Main Topics  . Smoking status: Never Smoker   . Smokeless tobacco: Never Used  . Alcohol Use: No  . Drug Use: No  . Sexual Activity: Not Currently   Other Topics Concern  .  Not on file   Social History Narrative     Current outpatient prescriptions:  .  albuterol (PROVENTIL HFA;VENTOLIN HFA) 108 (90 BASE) MCG/ACT inhaler, Inhale 2 puffs into the lungs every 6 (six) hours as needed for wheezing or shortness of breath., Disp: , Rfl:  .  allopurinol (ZYLOPRIM) 100 MG tablet, Take 1 tablet (100 mg total) by mouth 2 (two) times daily., Disp: 180 tablet, Rfl: 1 .  aspirin 81 MG tablet, Take 1 tablet (81 mg total) by mouth daily., Disp: 30 tablet, Rfl:  .  Cholecalciferol (VITAMIN D) 2000 UNITS tablet, Take 2,000 Units by mouth daily., Disp: , Rfl:  .  COLCRYS 0.6 MG tablet, TAKE ONE TABLET BY MOUTH TWICE DAILY AS NEEDED FOR GOUT, Disp: 30 tablet, Rfl: 5 .  diltiazem (CARDIZEM CD) 300 MG 24 hr capsule, Take 1 capsule (300 mg total) by mouth daily., Disp: 90 capsule, Rfl: 1 .  fluticasone (FLONASE) 50 MCG/ACT nasal spray, USE TWO SPRAY(S) IN EACH NOSTRIL AT BEDTIME, Disp: 16 g, Rfl: 0 .  fluticasone furoate-vilanterol (BREO ELLIPTA) 100-25 MCG/INH AEPB, Inhale 1 puff into the lungs daily., Disp: 60 each, Rfl: 2 .  furosemide (LASIX) 40 MG tablet, Take 1 tablet (40 mg total) by mouth as needed., Disp: 90 tablet, Rfl: 1 .  hydrALAZINE (APRESOLINE) 50 MG tablet, Take 1 tablet (50 mg total) by mouth 3 (three) times daily., Disp: 270 tablet, Rfl: 1 .  Insulin Lispro Prot & Lispro (HUMALOG MIX 75/25 KWIKPEN) (75-25) 100 UNIT/ML Kwikpen, Inject 40-60 Units into the skin 2 (two) times daily., Disp: 15 mL, Rfl: 2 .  Insulin Pen Needle (NOVOFINE) 30G X 8 MM MISC, Inject 1 packet into the skin daily., Disp: , Rfl:  .  irbesartan (AVAPRO) 300 MG tablet, Take 1 tablet (300 mg total) by mouth daily., Disp: 90 tablet, Rfl: 1 .  pravastatin (PRAVACHOL) 80 MG tablet, Take 1 tablet (80 mg total) by mouth daily., Disp: 90 tablet, Rfl: 1 .  rOPINIRole (REQUIP) 0.5 MG tablet, Take 1 tablet (0.5 mg total) by mouth at bedtime., Disp: 90 tablet, Rfl: 1  Allergies  Allergen Reactions  . Ace  Inhibitors      ROS  Constitutional: Negative for fever , positive for  weight change.  Respiratory: Positive  for cough and shortness of breath.   Cardiovascular: Negative for chest pain or palpitations.  Gastrointestinal: Negative for abdominal pain, no bowel changes.  Musculoskeletal: Negative for gait problem or joint swelling.  Skin: Negative for rash.  Neurological: Negative for dizziness or headache.  No other specific complaints in a complete review of systems (except as listed in HPI above).  Objective  Filed Vitals:   12/18/15 0809  BP: 132/60  Pulse: 103  Temp: 97.9 F (36.6 C)  TempSrc: Oral  Resp:  18  Height: 5\' 9"  (1.753 m)  Weight: 402 lb 12.8 oz (182.709 kg)  SpO2: 97%    Body mass index is 59.46 kg/(m^2).  Physical Exam  Constitutional: Patient appears well-developed . Morbid obese  No distress.  HEENT: head atraumatic, normocephalic, pupils equal and reactive to light,  neck supple, throat within normal limits Cardiovascular: Normal rate, regular rhythm and normal heart sounds.  No murmur heard. 1 plus BLE edema. Pulmonary/Chest: Effort normal and breath sounds normal. No respiratory distress. Abdominal: Soft.  There is no tenderness. Psychiatric: Patient has a normal mood and affect. behavior is normal. Judgment and thought content normal.   Recent Results (from the past 2160 hour(s))  POCT glycosylated hemoglobin (Hb A1C)     Status: Abnormal   Collection Time: 12/18/15  8:15 AM  Result Value Ref Range   Hemoglobin A1C 7.6      PHQ2/9: Depression screen [facility] 2/9 12/18/2015 11/17/2015 08/15/2015 04/15/2015  Decreased Interest 0 0 0 0  Down, Depressed, Hopeless 0 0 0 0  PHQ - 2 Score 0 0 0 0     Fall Risk: Fall Risk  12/18/2015 11/17/2015 08/15/2015 04/15/2015  Falls in the past year? No No No No      Functional Status Survey: Is the patient deaf or have difficulty hearing?: No Does the patient have difficulty seeing, even when wearing  glasses/contacts?: No Does the patient have difficulty concentrating, remembering, or making decisions?: No Does the patient have difficulty walking or climbing stairs?: No Does the patient have difficulty dressing or bathing?: No Does the patient have difficulty doing errands alone such as visiting a doctor's office or shopping?: No    Assessment & Plan  1. Uncontrolled type 2 diabetes mellitus with stage 3 chronic kidney disease, with long-term current use of insulin (HCC)  - POCT glycosylated hemoglobin (Hb A1C)  2. Obstructive apnea  Discussed importance of CPAP compliance  3. Chronic kidney disease (CKD), stage III (moderate)  - Hemoglobin and hematocrit, blood - Comprehensive metabolic panel  4. Essential hypertension, benign  - Comprehensive metabolic panel - diltiazem (CARDIZEM CD) 300 MG 24 hr capsule; Take 1 capsule (300 mg total) by mouth daily.  Dispense: 90 capsule; Refill: 1 - furosemide (LASIX) 40 MG tablet; Take 1 tablet (40 mg total) by mouth as needed.  Dispense: 90 tablet; Refill: 1 - hydrALAZINE (APRESOLINE) 50 MG tablet; Take 1 tablet (50 mg total) by mouth 3 (three) times daily.  Dispense: 270 tablet; Refill: 1 - irbesartan (AVAPRO) 300 MG tablet; Take 1 tablet (300 mg total) by mouth daily.  Dispense: 90 tablet; Refill: 1  5. Controlled gout  - allopurinol (ZYLOPRIM) 100 MG tablet; Take 1 tablet (100 mg total) by mouth 2 (two) times daily.  Dispense: 180 tablet; Refill: 1  6. Asthma, moderate persistent  Continue mediation for now, controlled  7. Hyperlipidemia  - pravastatin (PRAVACHOL) 80 MG tablet; Take 1 tablet (80 mg total) by mouth daily.  Dispense: 90 tablet; Refill: 1  8. Morbid obesity, unspecified obesity type [facility])  Discussed with the patient the risk posed by an increased BMI. Discussed importance of portion control, calorie counting and at least 150 minutes of physical activity weekly. Avoid sweet beverages and drink more water. Eat at  least 6 servings of fruit and vegetables daily   9. Atrial fibrillation, controlled (HCC)  - aspirin EC 325 MG tablet; Take 1 tablet (325 mg total) by mouth daily.  Dispense: 30 tablet; Refill: 0  10. Restless leg  -  rOPINIRole (REQUIP) 0.5 MG tablet; Take 1 tablet (0.5 mg total) by mouth at bedtime.  Dispense: 90 tablet; Refill: 1   11. Pulmonary hypertension ([place])  Needs to stay compliant with CPAP and also needs to control asthma

## 2015-12-19 LAB — COMPREHENSIVE METABOLIC PANEL
A/G RATIO: 1.4 (ref 1.2–2.2)
ALK PHOS: 86 IU/L (ref 39–117)
ALT: 8 IU/L (ref 0–32)
AST: 14 IU/L (ref 0–40)
Albumin: 3.7 g/dL (ref 3.6–4.8)
BILIRUBIN TOTAL: 0.4 mg/dL (ref 0.0–1.2)
BUN/Creatinine Ratio: 22 (ref 12–28)
BUN: 32 mg/dL — ABNORMAL HIGH (ref 8–27)
CHLORIDE: 100 mmol/L (ref 96–106)
CO2: 21 mmol/L (ref 18–29)
Calcium: 9.1 mg/dL (ref 8.7–10.3)
Creatinine, Ser: 1.45 mg/dL — ABNORMAL HIGH (ref 0.57–1.00)
GFR calc Af Amer: 43 mL/min/{1.73_m2} — ABNORMAL LOW (ref >59)
GFR calc non Af Amer: 37 mL/min/{1.73_m2} — ABNORMAL LOW (ref >59)
GLUCOSE: 147 mg/dL — AB (ref 65–99)
Globulin, Total: 2.7 g/dL (ref 1.5–4.5)
POTASSIUM: 5.1 mmol/L (ref 3.5–5.2)
Sodium: 140 mmol/L (ref 134–144)
Total Protein: 6.4 g/dL (ref 6.0–8.5)

## 2015-12-19 LAB — HEMOGLOBIN AND HEMATOCRIT, BLOOD
HEMATOCRIT: 33.4 % — AB (ref 34.0–46.6)
Hemoglobin: 10.7 g/dL — ABNORMAL LOW (ref 11.1–15.9)

## 2015-12-22 ENCOUNTER — Telehealth: Payer: Self-pay

## 2015-12-22 DIAGNOSIS — D631 Anemia in chronic kidney disease: Secondary | ICD-10-CM | POA: Insufficient documentation

## 2015-12-22 DIAGNOSIS — N189 Chronic kidney disease, unspecified: Secondary | ICD-10-CM

## 2015-12-22 NOTE — Telephone Encounter (Signed)
Added on blood work to existing labs.

## 2015-12-22 NOTE — Addendum Note (Signed)
Addended by: Inda Coke on: 12/22/2015 09:08 AM   Modules accepted: Orders

## 2015-12-22 NOTE — Telephone Encounter (Signed)
-----   Message from Steele Sizer, MD sent at 12/21/2015  9:16 PM EDT ----- hgb has dropped a little - anemia is worse. Please add ferritin and iron storages- it may be iron deficiency or chronic disease Glucose is stable, goal is to be below 140 fasting, CKI is stable, still state III , potassium is back to normal and also normal lft's

## 2015-12-23 ENCOUNTER — Encounter: Payer: Self-pay | Admitting: Family Medicine

## 2015-12-24 LAB — IRON AND TIBC
IRON SATURATION: 22 % (ref 15–55)
IRON: 54 ug/dL (ref 27–139)
Total Iron Binding Capacity: 245 ug/dL — ABNORMAL LOW (ref 250–450)
UIBC: 191 ug/dL (ref 118–369)

## 2015-12-24 LAB — FERRITIN: FERRITIN: 205 ng/mL — AB (ref 15–150)

## 2015-12-24 LAB — SPECIMEN STATUS REPORT

## 2015-12-25 ENCOUNTER — Encounter: Payer: Self-pay | Admitting: Family Medicine

## 2016-01-06 ENCOUNTER — Emergency Department
Admission: EM | Admit: 2016-01-06 | Discharge: 2016-01-06 | Disposition: A | Discharge status: Home / Self Care | Payer: Medicare Other | Attending: Emergency Medicine | Admitting: Emergency Medicine

## 2016-01-06 ENCOUNTER — Emergency Department: Payer: Medicare Other

## 2016-01-06 DIAGNOSIS — Z8679 Personal history of other diseases of the circulatory system: Secondary | ICD-10-CM | POA: Diagnosis not present

## 2016-01-06 DIAGNOSIS — Z7982 Long term (current) use of aspirin: Secondary | ICD-10-CM | POA: Insufficient documentation

## 2016-01-06 DIAGNOSIS — J45901 Unspecified asthma with (acute) exacerbation: Secondary | ICD-10-CM | POA: Diagnosis not present

## 2016-01-06 DIAGNOSIS — E785 Hyperlipidemia, unspecified: Secondary | ICD-10-CM | POA: Diagnosis not present

## 2016-01-06 DIAGNOSIS — R110 Nausea: Secondary | ICD-10-CM | POA: Diagnosis not present

## 2016-01-06 DIAGNOSIS — R079 Chest pain, unspecified: Secondary | ICD-10-CM | POA: Diagnosis not present

## 2016-01-06 DIAGNOSIS — N183 Chronic kidney disease, stage 3 (moderate): Secondary | ICD-10-CM | POA: Diagnosis not present

## 2016-01-06 DIAGNOSIS — M519 Unspecified thoracic, thoracolumbar and lumbosacral intervertebral disc disorder: Secondary | ICD-10-CM | POA: Insufficient documentation

## 2016-01-06 DIAGNOSIS — E119 Type 2 diabetes mellitus without complications: Secondary | ICD-10-CM | POA: Diagnosis not present

## 2016-01-06 DIAGNOSIS — I4891 Unspecified atrial fibrillation: Secondary | ICD-10-CM | POA: Diagnosis not present

## 2016-01-06 DIAGNOSIS — R42 Dizziness and giddiness: Secondary | ICD-10-CM | POA: Diagnosis not present

## 2016-01-06 DIAGNOSIS — R0602 Shortness of breath: Secondary | ICD-10-CM | POA: Diagnosis present

## 2016-01-06 DIAGNOSIS — Z794 Long term (current) use of insulin: Secondary | ICD-10-CM | POA: Insufficient documentation

## 2016-01-06 DIAGNOSIS — R0789 Other chest pain: Secondary | ICD-10-CM | POA: Diagnosis not present

## 2016-01-06 DIAGNOSIS — I129 Hypertensive chronic kidney disease with stage 1 through stage 4 chronic kidney disease, or unspecified chronic kidney disease: Secondary | ICD-10-CM | POA: Insufficient documentation

## 2016-01-06 LAB — BASIC METABOLIC PANEL
ANION GAP: 9 (ref 5–15)
BUN: 34 mg/dL — ABNORMAL HIGH (ref 6–20)
CHLORIDE: 100 mmol/L — AB (ref 101–111)
CO2: 22 mmol/L (ref 22–32)
Calcium: 8.4 mg/dL — ABNORMAL LOW (ref 8.9–10.3)
Creatinine, Ser: 1.53 mg/dL — ABNORMAL HIGH (ref 0.44–1.00)
GFR calc non Af Amer: 34 mL/min — ABNORMAL LOW (ref >60)
GFR, EST AFRICAN AMERICAN: 39 mL/min — AB (ref >60)
GLUCOSE: 125 mg/dL — AB (ref 65–99)
Potassium: 4.6 mmol/L (ref 3.5–5.1)
Sodium: 131 mmol/L — ABNORMAL LOW (ref 135–145)

## 2016-01-06 LAB — CBC
HEMATOCRIT: 33.6 % — AB (ref 35.0–47.0)
HEMOGLOBIN: 10.9 g/dL — AB (ref 12.0–16.0)
MCH: 27.7 pg (ref 26.0–34.0)
MCHC: 32.3 g/dL (ref 32.0–36.0)
MCV: 86 fL (ref 80.0–100.0)
PLATELETS: 221 10*3/uL (ref 150–440)
RBC: 3.91 MIL/uL (ref 3.80–5.20)
RDW: 15.9 % — AB (ref 11.5–14.5)
WBC: 9.1 10*3/uL (ref 3.6–11.0)

## 2016-01-06 LAB — TROPONIN I

## 2016-01-06 MED ORDER — IPRATROPIUM-ALBUTEROL 0.5-2.5 (3) MG/3ML IN SOLN
3.0000 mL | Freq: Once | RESPIRATORY_TRACT | Status: AC
Start: 2016-01-06 — End: 2016-01-06
  Administered 2016-01-06: 3 mL via RESPIRATORY_TRACT

## 2016-01-06 MED ORDER — IPRATROPIUM-ALBUTEROL 0.5-2.5 (3) MG/3ML IN SOLN
3.0000 mL | Freq: Once | RESPIRATORY_TRACT | Status: AC
  Administered 2016-01-06: 3 mL via RESPIRATORY_TRACT
  Filled 2016-01-06: qty 3

## 2016-01-06 MED ORDER — PREDNISONE 20 MG PO TABS
40.0000 mg | ORAL_TABLET | Freq: Once | ORAL | Status: AC
  Administered 2016-01-06: 40 mg via ORAL
  Filled 2016-01-06: qty 2

## 2016-01-06 MED ORDER — PREDNISONE 10 MG PO TABS: 10.0000 mg | ORAL_TABLET | Freq: Every day | ORAL | Status: DC

## 2016-01-06 NOTE — ED Provider Notes (Signed)
[facility] Emergency Department Provider Note   ____________________________________________  Time seen:  I have reviewed the triage vital signs and the triage nursing note.  HISTORY  Chief Complaint Dizziness and Irregular Heart Beat   Historian Patient  HPI Amber Reyes is a 70 y.o. female with a history of asthma and A. fib/A flutter, and a prior history of pericardial effusion is here complaining ofshortness of breath, wheezing, and dizziness/chest tightness this morning. It sounds like things started last night, and then this morning was a little bit worse. No headache. No confusion altered mental status. No chest discomfort now. She is still feeling some chest tightness and mild wheezing. She takes Avodart as well as pro-air.  No fever. Mild nonproductive cough.  Symptoms are mild to moderate. Nothing makes it worse or better, although she does chronically feel short of breath when she walks.    Past Medical History  Diagnosis Date  . Allergic rhinitis, cause unspecified   . Unspecified vitamin D deficiency   . Gout, unspecified   . Edema   . Sebaceous cyst   . Lipoma of unspecified site   . Vaginitis and vulvovaginitis, unspecified   . Other general symptoms(780.99)   . Type II or unspecified type diabetes mellitus without mention of complication, uncontrolled   . Unspecified asthma(493.90)   . Other and unspecified disc disorder of lumbar region   . Unspecified sleep apnea   . Restless legs syndrome (RLS)   . Obesity, unspecified   . A-fib ([place])   . Unspecified disease of pericardium   . Chronic kidney disease, unspecified ([place])   . Heart murmur     as child  . Arrhythmia     afib  . Essential hypertension, benign   . Hyperlipidemia   . Anxiety     Patient Active Problem List   Diagnosis Date Noted  . Anemia in chronic kidney disease 12/22/2015  . Pulmonary hypertension ([place]) 12/18/2015  . 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 Atrial  fibrillation, controlled ([place]) 04/12/2015  . Anxiety 04/12/2015  . Chronic kidney disease (CKD), stage III (moderate) 04/12/2015  . Edema extremities 04/12/2015  . 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 Asthma, moderate persistent 04/12/2015  . Morbid obesity ([place]) 04/12/2015  . Obstructive apnea 04/12/2015  . Restless leg 04/12/2015  . Allergic rhinitis 04/12/2015  . Vitamin D deficiency 04/12/2015  . Controlled gout 04/12/2015  . Premature atrial contractions 11/29/2013  . Essential hypertension, benign   . Hyperlipidemia     Past Surgical History  Procedure Laterality Date  . Cardiac catheterization  2002    [facility]  . Pericardium surgery      Current Outpatient Rx  Name  Route  Sig  Dispense  Refill  . albuterol (PROVENTIL HFA;VENTOLIN HFA) 108 (90 BASE) MCG/ACT inhaler   Inhalation   Inhale 2 puffs into the lungs every 6 (six) hours as needed for wheezing or shortness of breath.         . allopurinol (ZYLOPRIM) 100 MG tablet   Oral   Take 1 tablet (100 mg total) by mouth 2 (two) times daily.   180 tablet   1   . aspirin EC 325 MG tablet   Oral   Take 1 tablet (325 mg total) by mouth daily.   30 tablet   0   . Cholecalciferol (VITAMIN D) 2000 UNITS tablet   Oral   Take 2,000 Units by mouth daily.         Marland Kitchen  COLCRYS 0.6 MG tablet      TAKE ONE TABLET BY MOUTH TWICE DAILY AS NEEDED FOR GOUT   30 tablet   5   . diltiazem (CARDIZEM CD) 300 MG 24 hr capsule   Oral   Take 1 capsule (300 mg total) by mouth daily.   90 capsule   1   . fluticasone (FLONASE) 50 MCG/ACT nasal spray   Each Nare   Place 2 sprays into both nostrils daily.   48 g   1   . fluticasone furoate-vilanterol (BREO ELLIPTA) 100-25 MCG/INH AEPB   Inhalation   Inhale 1 puff into the lungs daily.   60 each   2   . furosemide (LASIX) 40 MG tablet   Oral   Take 1 tablet (40 mg total) by mouth as needed.   90 tablet   1   . hydrALAZINE (APRESOLINE) 50 MG tablet   Oral   Take  1 tablet (50 mg total) by mouth 3 (three) times daily.   270 tablet   1   . Insulin Lispro Prot & Lispro (HUMALOG MIX 75/25 KWIKPEN) (75-25) 100 UNIT/ML Kwikpen   Subcutaneous   Inject 40-60 Units into the skin 2 (two) times daily.   15 mL   2   . Insulin Pen Needle (NOVOFINE) 30G X 8 MM MISC   Subcutaneous   Inject 1 packet into the skin daily.         . irbesartan (AVAPRO) 300 MG tablet   Oral   Take 1 tablet (300 mg total) by mouth daily.   90 tablet   1   . pravastatin (PRAVACHOL) 80 MG tablet   Oral   Take 1 tablet (80 mg total) by mouth daily.   90 tablet   1   . predniSONE (DELTASONE) 10 MG tablet   Oral   Take 1 tablet (10 mg total) by mouth daily.   16 tablet   0   . rOPINIRole (REQUIP) 0.5 MG tablet   Oral   Take 1 tablet (0.5 mg total) by mouth at bedtime.   90 tablet   1     Allergies Ace inhibitors  Family History  Problem Relation Age of Onset  . Heart attack Mother   . Hypertension Mother     Social History Social History  Substance Use Topics  . Smoking status: Never Smoker   . Smokeless tobacco: Never Used  . Alcohol Use: No    Review of Systems  Constitutional: Negative for fever. Eyes: Negative for visual changes. ENT: Negative for sore throat. Cardiovascular: Negative for pleuritic chest pain.Marland Kitchen Respiratory: Mild amount or shortness of breath. Gastrointestinal: Negative for abdominal pain, vomiting and diarrhea. Genitourinary: Negative for dysuria. Musculoskeletal: Negative for back pain. Skin: Negative for rash. Neurological: Negative for headache. 10 point Review of Systems otherwise negative ____________________________________________   PHYSICAL EXAM:  VITAL SIGNS: ED Triage Vitals  Enc Vitals Group     BP 01/06/16 1340 159/57 mmHg     Pulse Rate 01/06/16 1340 91     Resp 01/06/16 1340 15     Temp 01/06/16 1340 97.8 F (36.6 C)     Temp Source 01/06/16 1340 Oral     SpO2 01/06/16 1340 99 %     Weight --       Height --      Head Cir --      Peak Flow --      Pain Score --  Pain Loc --      Pain Edu? --      Excl. in [place]? --      Constitutional: Alert and oriented. Well appearing and in no distress. HEENT   Head: Normocephalic and atraumatic.      Eyes: Conjunctivae are normal. PERRL. Normal extraocular movements.      Ears:         Nose: No congestion/rhinnorhea.   Mouth/Throat: Mucous membranes are moist.   Neck: No stridor. Cardiovascular/Chest:Irregularly irregular, regular rate.  No murmurs, rubs, or gallops. Respiratory: Normal respiratory effort without tachypnea nor retractions. Large chest, decreased air movement throughout with mild end expiratory wheezing and mild rhonchi throughout all fields especially posteriorly. Gastrointestinal: Soft. No distention, no guarding, no rebound. Nontender.    Genitourinary/rectal:Deferred Musculoskeletal: Nontender with normal range of motion in all extremities. No joint effusions.  No lower extremity tenderness.  No edema. Neurologic:  Normal speech and language. No gross or focal neurologic deficits are appreciated. Skin:  Skin is warm, dry and intact. No rash noted. Psychiatric: Mood and affect are normal. Speech and behavior are normal. Patient exhibits appropriate insight and judgment.  ____________________________________________   EKG I, Lisa Roca, MD, the attending physician have personally viewed and interpreted all ECGs.  80 bpm irregularly irregular, appears to be atrial fibrillation versus atrial flutter with variable block. Narrow QRS normal axis. Nonspecific T-wave ____________________________________________  LABS (pertinent positives/negatives)  Labs Reviewed  BASIC METABOLIC PANEL - Abnormal; Notable for the following:    Sodium 131 (*)    Chloride 100 (*)    Glucose, Bld 125 (*)    BUN 34 (*)    Creatinine, Ser 1.53 (*)    Calcium 8.4 (*)    GFR calc non Af Amer 34 (*)    GFR calc Af Amer 39 (*)     All other components within normal limits  CBC - Abnormal; Notable for the following:    Hemoglobin 10.9 (*)    HCT 33.6 (*)    RDW 15.9 (*)    All other components within normal limits  TROPONIN I     ____________________________________________  RADIOLOGY All Xrays were viewed by me. Imaging interpreted by Radiologist.  Chest two-view: No active cardiopulmonary disease __________________________________________  PROCEDURES  Procedure(s) performed: None  Critical Care performed: None  ____________________________________________   ED COURSE / ASSESSMENT AND PLAN  Pertinent labs & imaging results that were available during my care of the patient were reviewed by me and considered in my medical decision making (see chart for details).   Patient complaining of chest tightness and shortness of breath and on exam she has some wheezing I'm thinking clinically it seems most consistent with asthma exacerbation. She is not hypoxic. She does not have tachycardia. She was complaining of some dizziness, and may be in some palpitations, and her heart rate does seem a little irregular and she does have a history of A. fib versus a flutter.  Her initial troponin is negative. Symptoms of chest tightness seem unlikely to be acute coronary syndrome, and symptoms have been ongoing since this morning, I don't think additional troponin is warranted after the first negative.  Patient was given DuoNeb treatment and feels much better. I am going to actually place her on prednisone burst as well.  Symptoms do not seem consistent with PE. No pleuritic chest pain, hypoxia, or tachycardia or fever. Additionally the patient has no symptoms of DVT clinically.  Patient feels better and is in agreement for  discharge and outpatient follow-up.   CONSULTATIONS:   None   Patient / Family / Caregiver informed of clinical course, medical decision-making process, and agree with plan.   I discussed  return precautions, follow-up instructions, and discharged instructions with patient and/or family.   ___________________________________________   FINAL CLINICAL IMPRESSION(S) / ED DIAGNOSES   Final diagnoses:  Asthma exacerbation              Note: This dictation was prepared with Dragon dictation. Any transcriptional errors that result from this process are unintentional   Lisa Roca, MD 01/06/16 1743

## 2016-01-06 NOTE — ED Notes (Signed)
Patient transported to X-ray 

## 2016-01-06 NOTE — Discharge Instructions (Signed)
You were evaluated for chest tightness and found to have wheezing consistent with asthma exacerbation. Your exam and evaluation is otherwise reassuring in the emergency department stay.  Return to the emergency department for any worsening condition including any worsening trouble breathing, shortness of breath, and with breathing, chest pain, nausea, sweats, dizziness, passing out, or any other symptoms concerning to you.   Asthma, Acute Bronchospasm Acute bronchospasm caused by asthma is also referred to as an asthma attack. Bronchospasm means your air passages become narrowed. The narrowing is caused by inflammation and tightening of the muscles in the air tubes (bronchi) in your lungs. This can make it hard to breathe or cause you to wheeze and cough. CAUSES Possible triggers are:  Animal dander from the skin, hair, or feathers of animals.  Dust mites contained in house dust.  Cockroaches.  Pollen from trees or grass.  Mold.  Cigarette or tobacco smoke.  Air pollutants such as dust, household cleaners, hair sprays, aerosol sprays, paint fumes, strong chemicals, or strong odors.  Cold air or weather changes. Cold air may trigger inflammation. Winds increase molds and pollens in the air.  Strong emotions such as crying or laughing hard.  Stress.  Certain medicines such as aspirin or beta-blockers.  Sulfites in foods and drinks, such as dried fruits and wine.  Infections or inflammatory conditions, such as a flu, cold, or inflammation of the nasal membranes (rhinitis).  Gastroesophageal reflux disease (GERD). GERD is a condition where stomach acid backs up into your esophagus.  Exercise or strenuous activity. SIGNS AND SYMPTOMS   Wheezing.  Excessive coughing, particularly at night.  Chest tightness.  Shortness of breath. DIAGNOSIS  Your health care provider will ask you about your medical history and perform a physical exam. A chest X-ray or blood testing may be  performed to look for other causes of your symptoms or other conditions that may have triggered your asthma attack. TREATMENT  Treatment is aimed at reducing inflammation and opening up the airways in your lungs. Most asthma attacks are treated with inhaled medicines. These include quick relief or rescue medicines (such as bronchodilators) and controller medicines (such as inhaled corticosteroids). These medicines are sometimes given through an inhaler or a nebulizer. Systemic steroid medicine taken by mouth or given through an IV tube also can be used to reduce the inflammation when an attack is moderate or severe. Antibiotic medicines are only used if a bacterial infection is present.  HOME CARE INSTRUCTIONS   Rest.  Drink plenty of liquids. This helps the mucus to remain thin and be easily coughed up. Only use caffeine in moderation and do not use alcohol until you have recovered from your illness.  Do not smoke. Avoid being exposed to secondhand smoke.  You play a critical role in keeping yourself in good health. Avoid exposure to things that cause you to wheeze or to have breathing problems.  Keep your medicines up-to-date and available. Carefully follow your health care provider's treatment plan.  Take your medicine exactly as prescribed.  When pollen or pollution is bad, keep windows closed and use an air conditioner or go to places with air conditioning.  Asthma requires careful medical care. See your health care provider for a follow-up as advised. If you are more than [redacted] weeks pregnant and you were prescribed any new medicines, let your obstetrician know about the visit and how you are doing. Follow up with your health care provider as directed.  After you have recovered from your  asthma attack, make an appointment with your outpatient doctor to talk about ways to reduce the likelihood of future attacks. If you do not have a doctor who manages your asthma, make an appointment with a  primary care doctor to discuss your asthma. SEEK IMMEDIATE MEDICAL CARE IF:   You are getting worse.  You have trouble breathing. If severe, call your local emergency services (911 in the [place]  You develop chest pain or discomfort.  You are vomiting.  You are not able to keep fluids down.  You are coughing up yellow, green, brown, or bloody sputum.  You have a fever and your symptoms suddenly get worse.  You have trouble swallowing. MAKE SURE YOU:   Understand these instructions.  Will watch your condition.  Will get help right away if you are not doing well or get worse.   This information is not intended to replace advice given to you by your health care provider. Make sure you discuss any questions you have with your health care provider.   Document Released: 11/24/2006 Document Revised: 08/14/2013 Document Reviewed: 02/14/2013 Elsevier Interactive Patient Education [employer].

## 2016-01-06 NOTE — ED Notes (Signed)
Around 10:30 today pt called EMS because she had chest pain pain and felt that she was going to "pass out". EMS found patient to have a-flutter.  Pt reports hx of atrial flutter.  Pt is currently chest pain free but reports feeling dizzy.

## 2016-01-20 ENCOUNTER — Ambulatory Visit (INDEPENDENT_AMBULATORY_CARE_PROVIDER_SITE_OTHER): Payer: Medicare Other | Admitting: Family Medicine

## 2016-01-20 ENCOUNTER — Encounter: Payer: Self-pay | Admitting: Family Medicine

## 2016-01-20 VITALS — BP 128/60 | HR 85 | Temp 97.8°F | Resp 18 | Ht 69.0 in | Wt >= 6400 oz

## 2016-01-20 DIAGNOSIS — I10 Essential (primary) hypertension: Secondary | ICD-10-CM | POA: Diagnosis not present

## 2016-01-20 DIAGNOSIS — E871 Hypo-osmolality and hyponatremia: Secondary | ICD-10-CM

## 2016-01-20 DIAGNOSIS — E1122 Type 2 diabetes mellitus with diabetic chronic kidney disease: Secondary | ICD-10-CM | POA: Diagnosis not present

## 2016-01-20 DIAGNOSIS — N183 Chronic kidney disease, stage 3 (moderate): Secondary | ICD-10-CM

## 2016-01-20 DIAGNOSIS — R944 Abnormal results of kidney function studies: Secondary | ICD-10-CM

## 2016-01-20 DIAGNOSIS — E1165 Type 2 diabetes mellitus with hyperglycemia: Secondary | ICD-10-CM | POA: Diagnosis not present

## 2016-01-20 DIAGNOSIS — IMO0002 Reserved for concepts with insufficient information to code with codable children: Secondary | ICD-10-CM

## 2016-01-20 DIAGNOSIS — Z794 Long term (current) use of insulin: Secondary | ICD-10-CM | POA: Diagnosis not present

## 2016-01-20 DIAGNOSIS — J4540 Moderate persistent asthma, uncomplicated: Secondary | ICD-10-CM | POA: Diagnosis not present

## 2016-01-20 DIAGNOSIS — J45909 Unspecified asthma, uncomplicated: Secondary | ICD-10-CM | POA: Diagnosis not present

## 2016-01-20 MED ORDER — HYDRALAZINE HCL 50 MG PO TABS: 25.0000 mg | ORAL_TABLET | Freq: Three times a day (TID) | ORAL | Status: DC

## 2016-01-20 NOTE — Progress Notes (Signed)
Name: Amber Reyes   MRN: [id number]    DOB: 1946/08/17   Date:01/20/2016       Progress Note  Subjective  Chief Complaint  Chief Complaint  Patient presents with  . Asthma    patient is here for an ER f/u regarding her asthma. she was put on prednisoe. patient stated that he sx has improved since taking the medication.  . Medication Management    HPI  Asthma exacerbation follow up: she states she woke up on 01/06/2016 with severe SOB and wheezing, went to [facility] by EMS, diagnosed with asthma exacerbation ( not sure of the cause ). She was given prednisone and symptoms have improved. She is back to baseline. She denies SOB, wheezing or cough at this time. Taking Breo daily , she states she is using neb treatment about every other day, she prefers using machine than using rescue inhaler. She denies orthopnea  DM: she brought a log of her glucose has been well controlled, even while on prednisone. No polyphagia, but mouth is always dry and has to sip on water  HTN: bp at home has been in the low range of normal, she denies dizziness, chest pain, but she has palpitation, likely from afib. Taking Cardizem, Avapro and also Hydralazine, we will try changing to 25 to 50 mg of Hydralazine three times daily and monitor bp   Patient Active Problem List   Diagnosis Date Noted  . Anemia in chronic kidney disease 12/22/2015  . Pulmonary hypertension ([place]) 12/18/2015  . 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 Atrial fibrillation, controlled ([place]) 04/12/2015  . Anxiety 04/12/2015  . Chronic kidney disease (CKD), stage III (moderate) 04/12/2015  . Edema extremities 04/12/2015  . 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 Asthma, moderate persistent 04/12/2015  . Morbid obesity ([place]) 04/12/2015  . Obstructive apnea 04/12/2015  . Restless leg 04/12/2015  . Allergic rhinitis 04/12/2015  . Vitamin D deficiency 04/12/2015  . Controlled gout 04/12/2015  . Premature atrial contractions 11/29/2013  . Essential  hypertension, benign   . Hyperlipidemia     Past Surgical History  Procedure Laterality Date  . Cardiac catheterization  2002    [facility]  . Pericardium surgery      Family History  Problem Relation Age of Onset  . Heart attack Mother   . Hypertension Mother     Social History   Social History  . Marital Status: Married    Spouse Name: N/A  . Number of Children: N/A  . Years of Education: N/A   Occupational History  . Not on file.   Social History Main Topics  . Smoking status: Never Smoker   . Smokeless tobacco: Never Used  . Alcohol Use: No  . Drug Use: No  . Sexual Activity: Not Currently   Other Topics Concern  . Not on file   Social History Narrative     Current outpatient prescriptions:  .  albuterol (PROVENTIL HFA;VENTOLIN HFA) 108 (90 BASE) MCG/ACT inhaler, Inhale 2 puffs into the lungs every 6 (six) hours as needed for wheezing or shortness of breath., Disp: , Rfl:  .  allopurinol (ZYLOPRIM) 100 MG tablet, Take 1 tablet (100 mg total) by mouth 2 (two) times daily., Disp: 180 tablet, Rfl: 1 .  aspirin EC 325 MG tablet, Take 1 tablet (325 mg total) by mouth daily., Disp: 30 tablet, Rfl: 0 .  Cholecalciferol (VITAMIN D) 2000 UNITS tablet, Take 2,000 Units by mouth daily., Disp: , Rfl:  .  COLCRYS 0.6  MG tablet, TAKE ONE TABLET BY MOUTH TWICE DAILY AS NEEDED FOR GOUT, Disp: 30 tablet, Rfl: 5 .  diltiazem (CARDIZEM CD) 300 MG 24 hr capsule, Take 1 capsule (300 mg total) by mouth daily., Disp: 90 capsule, Rfl: 1 .  fluticasone (FLONASE) 50 MCG/ACT nasal spray, Place 2 sprays into both nostrils daily., Disp: 48 g, Rfl: 1 .  fluticasone furoate-vilanterol (BREO ELLIPTA) 100-25 MCG/INH AEPB, Inhale 1 puff into the lungs daily., Disp: 60 each, Rfl: 2 .  furosemide (LASIX) 40 MG tablet, Take 1 tablet (40 mg total) by mouth as needed., Disp: 90 tablet, Rfl: 1 .  hydrALAZINE (APRESOLINE) 50 MG tablet, Take 0.5-1 tablets (25-50 mg total) by mouth 3 (three) times daily.,  Disp: 270 tablet, Rfl: 1 .  Insulin Lispro Prot & Lispro (HUMALOG MIX 75/25 KWIKPEN) (75-25) 100 UNIT/ML Kwikpen, Inject 40-60 Units into the skin 2 (two) times daily., Disp: 15 mL, Rfl: 2 .  Insulin Pen Needle (NOVOFINE) 30G X 8 MM MISC, Inject 1 packet into the skin daily., Disp: , Rfl:  .  irbesartan (AVAPRO) 300 MG tablet, Take 1 tablet (300 mg total) by mouth daily., Disp: 90 tablet, Rfl: 1 .  pravastatin (PRAVACHOL) 80 MG tablet, Take 1 tablet (80 mg total) by mouth daily., Disp: 90 tablet, Rfl: 1 .  rOPINIRole (REQUIP) 0.5 MG tablet, Take 1 tablet (0.5 mg total) by mouth at bedtime., Disp: 90 tablet, Rfl: 1  Allergies  Allergen Reactions  . Ace Inhibitors      ROS  Ten systems reviewed and is negative except as mentioned in HPI   Objective  Filed Vitals:   01/20/16 1109 01/20/16 1148  BP: 128/60   Pulse: 116 85  Temp: 97.8 F (36.6 C)   TempSrc: Oral   Resp: 18   Height: _0  (1.753 m)   Weight: 404 lb 9.6 oz (183.525 kg)   SpO2: 98%     Body mass index is 59.72 kg/(m^2).  Physical Exam  Constitutional: Patient appears well-developed and well-nourished. Morbidly obese No distress.  HEENT: head atraumatic, normocephalic, pupils equal and reactive to light,  neck supple, throat within normal limits Cardiovascular: Normal rate, irregular rhythm and normal heart sounds.  No murmur heard. 1 plus  BLE edema. Pulmonary/Chest: Effort normal and breath sounds normal. No respiratory distress. Abdominal: Soft.  There is no tenderness. Psychiatric: Patient has a normal mood and affect. behavior is normal. Judgment and thought content normal.  Recent Results (from the past 2160 hour(s))  POCT glycosylated hemoglobin (Hb A1C)     Status: Abnormal   Collection Time: 12/18/15  8:15 AM  Result Value Ref Range   Hemoglobin A1C 7.6   Hemoglobin and hematocrit, blood     Status: Abnormal   Collection Time: 12/18/15  9:11 AM  Result Value Ref Range   Hemoglobin 10.7 (L) 11.1 -  15.9 g/dL   Hematocrit 33.4 (L) 34.0 - 46.6 %  Comprehensive metabolic panel     Status: Abnormal   Collection Time: 12/18/15  9:11 AM  Result Value Ref Range   Glucose 147 (H) 65 - 99 mg/dL   BUN 32 (H) 8 - 27 mg/dL   Creatinine, Ser 1.45 (H) 0.57 - 1.00 mg/dL   GFR calc non Af Amer 37 (L) >59 mL/min/1.73   GFR calc Af Amer 43 (L) >59 mL/min/1.73   BUN/Creatinine Ratio 22 12 - 28   Sodium 140 134 - 144 mmol/L   Potassium 5.1 3.5 - 5.2 mmol/L  Chloride 100 96 - 106 mmol/L   CO2 21 18 - 29 mmol/L   Calcium 9.1 8.7 - 10.3 mg/dL   Total Protein 6.4 6.0 - 8.5 g/dL   Albumin 3.7 3.6 - 4.8 g/dL   Globulin, Total 2.7 1.5 - 4.5 g/dL   Albumin/Globulin Ratio 1.4 1.2 - 2.2   Bilirubin Total 0.4 0.0 - 1.2 mg/dL   Alkaline Phosphatase 86 39 - 117 IU/L   AST 14 0 - 40 IU/L   ALT 8 0 - 32 IU/L  Iron and TIBC     Status: Abnormal   Collection Time: 12/18/15  9:11 AM  Result Value Ref Range   Total Iron Binding Capacity 245 (L) 250 - 450 ug/dL   UIBC 191 118 - 369 ug/dL   Iron 54 27 - 139 ug/dL   Iron Saturation 22 15 - 55 %  Ferritin     Status: Abnormal   Collection Time: 12/18/15  9:11 AM  Result Value Ref Range   Ferritin 205 (H) 15 - 150 ng/mL  Specimen status report     Status: None   Collection Time: 12/18/15  9:11 AM  Result Value Ref Range   specimen status report Comment     Comment: Written Authorization Written Authorization Written Authorization Received. Authorization received from [place] 12-23-2015 Logged by Adah Perl   Basic metabolic panel     Status: Abnormal   Collection Time: 01/06/16  1:45 PM  Result Value Ref Range   Sodium 131 (L) 135 - 145 mmol/L   Potassium 4.6 3.5 - 5.1 mmol/L   Chloride 100 (L) 101 - 111 mmol/L   CO2 22 22 - 32 mmol/L   Glucose, Bld 125 (H) 65 - 99 mg/dL   BUN 34 (H) 6 - 20 mg/dL   Creatinine, Ser 1.53 (H) 0.44 - 1.00 mg/dL   Calcium 8.4 (L) 8.9 - 10.3 mg/dL   GFR calc non Af Amer 34 (L) >60 mL/min   GFR calc Af Amer  39 (L) >60 mL/min    Comment: (NOTE) The eGFR has been calculated using the CKD EPI equation. This calculation has not been validated in all clinical situations. eGFR's persistently <60 mL/min signify possible Chronic Kidney Disease.    Anion gap 9 5 - 15  CBC     Status: Abnormal   Collection Time: 01/06/16  1:45 PM  Result Value Ref Range   WBC 9.1 3.6 - 11.0 K/uL   RBC 3.91 3.80 - 5.20 MIL/uL   Hemoglobin 10.9 (L) 12.0 - 16.0 g/dL   HCT 33.6 (L) 35.0 - 47.0 %   MCV 86.0 80.0 - 100.0 fL   MCH 27.7 26.0 - 34.0 pg   MCHC 32.3 32.0 - 36.0 g/dL   RDW 15.9 (H) 11.5 - 14.5 %   Platelets 221 150 - 440 K/uL  Troponin I     Status: None   Collection Time: 01/06/16  1:45 PM  Result Value Ref Range   Troponin I <0.03 <0.031 ng/mL    Comment:        NO INDICATION OF MYOCARDIAL INJURY.      PHQ2/9: Depression screen [facility] 2/9 01/20/2016 12/18/2015 11/17/2015 08/15/2015 04/15/2015  Decreased Interest 0 0 0 0 0  Down, Depressed, Hopeless 0 0 0 0 0  PHQ - 2 Score 0 0 0 0 0     Fall Risk: Fall Risk  01/20/2016 12/18/2015 11/17/2015 08/15/2015 04/15/2015  Falls in the past year? _0   Functional Status Survey: Is the patient deaf or have difficulty hearing?: No Does the patient have difficulty seeing, even when wearing glasses/contacts?: No Does the patient have difficulty concentrating, remembering, or making decisions?: No Does the patient have difficulty walking or climbing stairs?: No Does the patient have difficulty dressing or bathing?: No Does the patient have difficulty doing errands alone such as visiting a doctor's office or shopping?: No    Assessment & Plan  1. Essential hypertension, benign  - hydrALAZINE (APRESOLINE) 50 MG tablet; Take 0.5-1 tablets (25-50 mg total) by mouth 3 (three) times daily.  Dispense: 270 tablet; Refill: 1  2. Asthma, moderate persistent, uncomplicated  Doing well now, we will order neb machine through a home health agency,  currently using grandson's machine  3. Uncontrolled type 2 diabetes mellitus with stage 3 chronic kidney disease, with long-term current use of insulin (HCC)  Continue current regiment   4. Low sodium levels  - Basic metabolic panel; Future  5. Low calcium levels  - Basic metabolic panel; Future  6. Decreased GFR  - Basic metabolic panel; Future

## 2016-01-21 DIAGNOSIS — R944 Abnormal results of kidney function studies: Secondary | ICD-10-CM | POA: Diagnosis not present

## 2016-01-21 DIAGNOSIS — E871 Hypo-osmolality and hyponatremia: Secondary | ICD-10-CM | POA: Diagnosis not present

## 2016-01-26 ENCOUNTER — Other Ambulatory Visit: Payer: Self-pay | Admitting: Family Medicine

## 2016-01-27 ENCOUNTER — Encounter: Payer: Self-pay | Admitting: Family Medicine

## 2016-02-09 DIAGNOSIS — H43811 Vitreous degeneration, right eye: Secondary | ICD-10-CM | POA: Diagnosis not present

## 2016-02-14 ENCOUNTER — Other Ambulatory Visit: Payer: Self-pay | Admitting: Family Medicine

## 2016-02-24 DIAGNOSIS — H43811 Vitreous degeneration, right eye: Secondary | ICD-10-CM | POA: Diagnosis not present

## 2016-03-19 DIAGNOSIS — B0052 Herpesviral keratitis: Secondary | ICD-10-CM | POA: Diagnosis not present

## 2016-03-22 DIAGNOSIS — B0052 Herpesviral keratitis: Secondary | ICD-10-CM | POA: Diagnosis not present

## 2016-03-29 DIAGNOSIS — B0052 Herpesviral keratitis: Secondary | ICD-10-CM | POA: Diagnosis not present

## 2016-04-20 ENCOUNTER — Ambulatory Visit (INDEPENDENT_AMBULATORY_CARE_PROVIDER_SITE_OTHER): Payer: Medicare Other | Admitting: Family Medicine

## 2016-04-20 ENCOUNTER — Encounter: Payer: Self-pay | Admitting: Family Medicine

## 2016-04-20 VITALS — BP 122/60 | HR 87 | Temp 98.1°F | Resp 14 | Ht 70.0 in | Wt 399.1 lb

## 2016-04-20 DIAGNOSIS — Z794 Long term (current) use of insulin: Secondary | ICD-10-CM

## 2016-04-20 DIAGNOSIS — N183 Chronic kidney disease, stage 3 unspecified: Secondary | ICD-10-CM

## 2016-04-20 DIAGNOSIS — E1122 Type 2 diabetes mellitus with diabetic chronic kidney disease: Secondary | ICD-10-CM | POA: Diagnosis not present

## 2016-04-20 DIAGNOSIS — I10 Essential (primary) hypertension: Secondary | ICD-10-CM | POA: Diagnosis not present

## 2016-04-20 DIAGNOSIS — R600 Localized edema: Secondary | ICD-10-CM

## 2016-04-20 DIAGNOSIS — E785 Hyperlipidemia, unspecified: Secondary | ICD-10-CM

## 2016-04-20 DIAGNOSIS — G4733 Obstructive sleep apnea (adult) (pediatric): Secondary | ICD-10-CM

## 2016-04-20 DIAGNOSIS — I491 Atrial premature depolarization: Secondary | ICD-10-CM | POA: Diagnosis not present

## 2016-04-20 DIAGNOSIS — M109 Gout, unspecified: Secondary | ICD-10-CM

## 2016-04-20 DIAGNOSIS — I272 Other secondary pulmonary hypertension: Secondary | ICD-10-CM

## 2016-04-20 DIAGNOSIS — J4540 Moderate persistent asthma, uncomplicated: Secondary | ICD-10-CM | POA: Diagnosis not present

## 2016-04-20 DIAGNOSIS — E1165 Type 2 diabetes mellitus with hyperglycemia: Secondary | ICD-10-CM | POA: Diagnosis not present

## 2016-04-20 DIAGNOSIS — IMO0002 Reserved for concepts with insufficient information to code with codable children: Secondary | ICD-10-CM

## 2016-04-20 LAB — HEMOGLOBIN AND HEMATOCRIT, BLOOD
HEMATOCRIT: 35.5 % (ref 35.0–45.0)
HEMOGLOBIN: 11.4 g/dL — AB (ref 11.7–15.5)

## 2016-04-20 LAB — COMPLETE METABOLIC PANEL WITH GFR
ALT: 7 U/L (ref 6–29)
AST: 14 U/L (ref 10–35)
Albumin: 3.7 g/dL (ref 3.6–5.1)
Alkaline Phosphatase: 96 U/L (ref 33–130)
BUN: 40 mg/dL — AB (ref 7–25)
CHLORIDE: 102 mmol/L (ref 98–110)
CO2: 25 mmol/L (ref 20–31)
CREATININE: 1.66 mg/dL — AB (ref 0.50–0.99)
Calcium: 9.1 mg/dL (ref 8.6–10.4)
GFR, EST AFRICAN AMERICAN: 36 mL/min — AB (ref >=60)
GFR, Est Non African American: 31 mL/min — ABNORMAL LOW (ref >=60)
GLUCOSE: 168 mg/dL — AB (ref 65–99)
Potassium: 5.1 mmol/L (ref 3.5–5.3)
Sodium: 140 mmol/L (ref 135–146)
TOTAL PROTEIN: 7 g/dL (ref 6.1–8.1)
Total Bilirubin: 0.4 mg/dL (ref 0.2–1.2)

## 2016-04-20 LAB — HEMOGLOBIN A1C
HEMOGLOBIN A1C: 7.2 % — AB (ref <5.7)
Mean Plasma Glucose: 160 mg/dL

## 2016-04-20 LAB — URIC ACID: Uric Acid, Serum: 5.2 mg/dL (ref 2.5–7.0)

## 2016-04-20 LAB — LIPID PANEL
CHOLESTEROL: 143 mg/dL (ref 125–200)
HDL: 52 mg/dL (ref >=46)
LDL CALC: 74 mg/dL (ref <130)
TRIGLYCERIDES: 86 mg/dL (ref <150)
Total CHOL/HDL Ratio: 2.8 Ratio (ref <=5.0)
VLDL: 17 mg/dL (ref <30)

## 2016-04-20 MED ORDER — MEDICAL COMPRESSION STOCKINGS MISC: 2.0000 | Freq: Every day | 2 refills | Status: AC

## 2016-04-20 MED ORDER — IRBESARTAN 300 MG PO TABS: 300.0000 mg | ORAL_TABLET | Freq: Every day | ORAL | 1 refills | Status: DC

## 2016-04-20 MED ORDER — POTASSIUM CHLORIDE ER 20 MEQ PO TBCR: 1.0000 | EXTENDED_RELEASE_TABLET | Freq: Every day | ORAL | 1 refills | Status: DC

## 2016-04-20 MED ORDER — ALLOPURINOL 100 MG PO TABS: 100.0000 mg | ORAL_TABLET | Freq: Two times a day (BID) | ORAL | 1 refills | Status: DC

## 2016-04-20 MED ORDER — INSULIN PEN NEEDLE 30G X 8 MM MISC: 1.0000 | Freq: Every day | 1 refills | Status: DC

## 2016-04-20 MED ORDER — HYDRALAZINE HCL 10 MG PO TABS: 10.0000 mg | ORAL_TABLET | Freq: Two times a day (BID) | ORAL | 1 refills | Status: DC

## 2016-04-20 MED ORDER — DILTIAZEM HCL ER COATED BEADS 300 MG PO CP24: 300.0000 mg | ORAL_CAPSULE | Freq: Every day | ORAL | 1 refills | Status: DC

## 2016-04-20 MED ORDER — INSULIN LISPRO PROT & LISPRO (75-25 MIX) 100 UNIT/ML KWIKPEN: 40.0000 [IU] | PEN_INJECTOR | Freq: Two times a day (BID) | SUBCUTANEOUS | 2 refills | Status: DC

## 2016-04-20 MED ORDER — BUDESONIDE 0.25 MG/2ML IN SUSP: 0.2500 mg | Freq: Two times a day (BID) | RESPIRATORY_TRACT | 2 refills | Status: DC

## 2016-04-20 MED ORDER — PRAVASTATIN SODIUM 80 MG PO TABS: 80.0000 mg | ORAL_TABLET | Freq: Every day | ORAL | 1 refills | Status: DC

## 2016-04-20 MED ORDER — FUROSEMIDE 40 MG PO TABS: 40.0000 mg | ORAL_TABLET | ORAL | 1 refills | Status: DC | PRN

## 2016-04-20 NOTE — Progress Notes (Signed)
Name: Amber Reyes   MRN: [id number]    DOB: 1946-08-15   Date:04/20/2016       Progress Note  Subjective  Chief Complaint  Chief Complaint  Patient presents with  . Follow-up    4 months    HPI  Asthma Moderate: she had a flare that required going to [facility] back in May 2017. She is now using Albuterol solution four times daily, but never got pulmicort, she denies cough or wheezing. She has SOB with activity - likely multifactorial.   DM: she brought a log of her glucose has been well controlled, glucose as low as 91, high of 150's, average is the 120's-130's. She has CKI and is on ARB. She used to see nephrologist but was advised to only go back if kidney function drops. She is drinking plenty of water and using Insulin as prescribed. She denies polyphagia, polydipsia or polyuria. On aspirin and statin therapy .   Morbid Obesity: she has lost 6 lbs since last visit, no longer drinking sodas or sweet tea, drinking more water and is eating healthier  HTN: bp at home has been in the low range of normal, she denies dizziness, chest pain, but she has occasionl palpitation, reviewed records and she does not have Afib but she has a history of PAC and seen by Dr. Fletcher Anon. Taking Cardizem, Avapro and also Hydralazine - down to 25 mg twice daily and bp is still low, we will change dose to 10 mg twice daily and monitor.   OSA/pulmonary hypertension: she is wearing CPAP 3-4 times weekly , when on it is all night.   CKI stage III: occasionally has pruritis arms/ from dry skin, it improves with lotion, normal urine output.   Controlled gout: no recent episodes taking Allopurinol as prescribed  Patient Active Problem List   Diagnosis Date Noted  . Anemia in chronic kidney disease 12/22/2015  . Pulmonary hypertension ([place]) 12/18/2015  . 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 Anxiety 04/12/2015  . Chronic kidney disease (CKD), stage III (moderate) 04/12/2015  . Edema extremities 04/12/2015  . Disc disorder of  lumbar region 04/12/2015  . Asthma, moderate persistent 04/12/2015  . Morbid obesity ([place]) 04/12/2015  . Obstructive apnea 04/12/2015  . Restless leg 04/12/2015  . Allergic rhinitis 04/12/2015  . Vitamin D deficiency 04/12/2015  . Controlled gout 04/12/2015  . Premature atrial contractions 11/29/2013  . Essential hypertension, benign   . Hyperlipidemia     Past Surgical History:  Procedure Laterality Date  . CARDIAC CATHETERIZATION  2002   [facility]  . PERICARDIUM SURGERY      Family History  Problem Relation Age of Onset  . Heart attack Mother   . Hypertension Mother     Social History   Social History  . Marital status: Married    Spouse name: N/A  . Number of children: N/A  . Years of education: N/A   Occupational History  . Not on file.   Social History Main Topics  . Smoking status: Never Smoker  . Smokeless tobacco: Never Used  . Alcohol use No  . Drug use: No  . Sexual activity: Not Currently   Other Topics Concern  . Not on file   Social History Narrative  . No narrative on file     Current Outpatient Prescriptions:  .  albuterol (PROVENTIL HFA;VENTOLIN HFA) 108 (90 BASE) MCG/ACT inhaler, Inhale 2 puffs into the lungs every 6 (six) hours as needed for wheezing or shortness of breath., Disp: ,  Rfl:  .  allopurinol (ZYLOPRIM) 100 MG tablet, Take 1 tablet (100 mg total) by mouth 2 (two) times daily., Disp: 180 tablet, Rfl: 1 .  aspirin EC 325 MG tablet, Take 1 tablet (325 mg total) by mouth daily., Disp: 30 tablet, Rfl: 0 .  budesonide (PULMICORT) 0.25 MG/2ML nebulizer solution, Take 2 mLs (0.25 mg total) by nebulization 2 (two) times daily., Disp: 120 mL, Rfl: 2 .  Cholecalciferol (VITAMIN D) 2000 UNITS tablet, Take 2,000 Units by mouth daily., Disp: , Rfl:  .  COLCRYS 0.6 MG tablet, TAKE ONE TABLET BY MOUTH TWICE DAILY AS NEEDED FOR GOUT, Disp: 30 tablet, Rfl: 5 .  diltiazem (CARDIZEM CD) 300 MG 24 hr capsule, Take 1 capsule (300 mg total) by mouth daily.,  Disp: 90 capsule, Rfl: 1 .  fluticasone (FLONASE) 50 MCG/ACT nasal spray, Place 2 sprays into both nostrils daily., Disp: 48 g, Rfl: 1 .  furosemide (LASIX) 40 MG tablet, Take 1 tablet (40 mg total) by mouth as needed., Disp: 90 tablet, Rfl: 1 .  hydrALAZINE (APRESOLINE) 10 MG tablet, Take 1 tablet (10 mg total) by mouth 2 (two) times daily., Disp: 90 tablet, Rfl: 1 .  Insulin Lispro Prot & Lispro (HUMALOG MIX 75/25 KWIKPEN) (75-25) 100 UNIT/ML Kwikpen, Inject 40-60 Units into the skin 2 (two) times daily., Disp: 15 mL, Rfl: 2 .  Insulin Pen Needle (NOVOFINE) 30G X 8 MM MISC, Inject 10 each into the skin daily., Disp: 100 each, Rfl: 1 .  irbesartan (AVAPRO) 300 MG tablet, Take 1 tablet (300 mg total) by mouth daily., Disp: 90 tablet, Rfl: 1 .  Potassium Chloride ER 20 MEQ TBCR, Take 1 tablet by mouth daily., Disp: 90 tablet, Rfl: 1 .  pravastatin (PRAVACHOL) 80 MG tablet, Take 1 tablet (80 mg total) by mouth daily., Disp: 90 tablet, Rfl: 1  Allergies  Allergen Reactions  . Ace Inhibitors      ROS  Constitutional: Negative for fever or significant  weight change.  Respiratory: Negative for cough , positive for mild  shortness of breath.   Cardiovascular: Negative for chest pain or palpitations.  Gastrointestinal: Negative for abdominal pain, no bowel changes.  Musculoskeletal: Negative for gait problem or joint swelling.  Skin: Negative for rash.  Neurological: Negative for dizziness or headache.  No other specific complaints in a complete review of systems (except as listed in HPI above).  Objective  Vitals:   04/20/16 0807  BP: 122/60  Pulse: 87  Resp: 14  Temp: 98.1 F (36.7 C)  TempSrc: Oral  SpO2: 97%  Weight: (!) 399 lb 1.6 oz (181 kg)  Height: 5\' 10"  (1.778 m)    Body mass index is 57.26 kg/m.  Physical Exam  Constitutional: Patient appears well-developed and well-nourished. Obese  No distress.  HEENT: head atraumatic, normocephalic, pupils equal and reactive to  light, neck supple, throat within normal limits Cardiovascular: Normal rate, regular rhythm and normal heart sounds.  No murmur heard. Trace BLE edema. Pulmonary/Chest: Effort normal and breath sounds normal. No respiratory distress. Abdominal: Soft.  There is no tenderness. Psychiatric: Patient has a normal mood and affect. behavior is normal. Judgment and thought content normal.  Diabetic Foot Exam: Diabetic Foot Exam - Simple   Simple Foot Form Diabetic Foot exam was performed with the following findings:  Yes 04/20/2016  8:54 AM  Visual Inspection No deformities, no ulcerations, no other skin breakdown bilaterally:  Yes Sensation Testing Intact to touch and monofilament testing bilaterally:  Yes Pulse  Check See comments:  Yes Comments Hard to feel posterior tibialis because of foot edema     PHQ2/9: Depression screen [facility] 2/9 04/20/2016 01/20/2016 12/18/2015 11/17/2015 08/15/2015  Decreased Interest 0 0 0 0 0  Down, Depressed, Hopeless 0 0 0 0 0  PHQ - 2 Score 0 0 0 0 0    Fall Risk: Fall Risk  04/20/2016 01/20/2016 12/18/2015 11/17/2015 08/15/2015  Falls in the past year? No No No No No    Functional Status Survey: Is the patient deaf or have difficulty hearing?: No Does the patient have difficulty seeing, even when wearing glasses/contacts?: Yes Does the patient have difficulty concentrating, remembering, or making decisions?: No Does the patient have difficulty walking or climbing stairs?: No Does the patient have difficulty dressing or bathing?: No Does the patient have difficulty doing errands alone such as visiting a doctor's office or shopping?: No   Assessment & Plan  1. Essential hypertension, benign  - diltiazem (CARDIZEM CD) 300 MG 24 hr capsule; Take 1 capsule (300 mg total) by mouth daily.  Dispense: 90 capsule; Refill: 1 - furosemide (LASIX) 40 MG tablet; Take 1 tablet (40 mg total) by mouth as needed.  Dispense: 90 tablet; Refill: 1 - irbesartan (AVAPRO) 300  MG tablet; Take 1 tablet (300 mg total) by mouth daily.  Dispense: 90 tablet; Refill: 1 - Potassium Chloride ER 20 MEQ TBCR; Take 1 tablet by mouth daily.  Dispense: 90 tablet; Refill: 1 - COMPLETE METABOLIC PANEL WITH GFR - hydrALAZINE (APRESOLINE) 10 MG tablet; Take 1 tablet (10 mg total) by mouth 2 (two) times daily.  Dispense: 90 tablet; Refill: 1  2. Uncontrolled type 2 diabetes mellitus with stage 3 chronic kidney disease, with long-term current use of insulin (HCC)  - Hemoglobin A1c - Insulin Lispro Prot & Lispro (HUMALOG MIX 75/25 KWIKPEN) (75-25) 100 UNIT/ML Kwikpen; Inject 40-60 Units into the skin 2 (two) times daily.  Dispense: 15 mL; Refill: 2  3. Controlled gout  - allopurinol (ZYLOPRIM) 100 MG tablet; Take 1 tablet (100 mg total) by mouth 2 (two) times daily.  Dispense: 180 tablet; Refill: 1 - COMPLETE METABOLIC PANEL WITH GFR - Uric acid  4. Chronic kidney disease (CKD), stage III (moderate)  - COMPLETE METABOLIC PANEL WITH GFR - Hemoglobin and Hematocrit, Blood  5. Obstructive apnea  Continue CPAP   6. Asthma, moderate persistent, uncomplicated  - budesonide (PULMICORT) 0.25 MG/2ML nebulizer solution; Take 2 mLs (0.25 mg total) by nebulization 2 (two) times daily.  Dispense: 120 mL; Refill: 2  7. Hyperlipidemia  - pravastatin (PRAVACHOL) 80 MG tablet; Take 1 tablet (80 mg total) by mouth daily.  Dispense: 90 tablet; Refill: 1 - Lipid panel  8. Morbid obesity, unspecified obesity type [facility])  Discussed with the patient the risk posed by an increased BMI. Discussed importance of portion control, calorie counting and at least 150 minutes of physical activity weekly. Avoid sweet beverages and drink more water. Eat at least 6 servings of fruit and vegetables daily   9. Pulmonary hypertension ([place])  Needs to wear CPAP every night   10. PAC (premature atrial contraction)  Continue Cardizem    11. Bilateral edema of lower extremity  -compression stocking  hoses

## 2016-04-21 ENCOUNTER — Other Ambulatory Visit: Payer: Self-pay | Admitting: Family Medicine

## 2016-04-21 ENCOUNTER — Telehealth: Payer: Self-pay

## 2016-04-21 DIAGNOSIS — N183 Chronic kidney disease, stage 3 unspecified: Secondary | ICD-10-CM

## 2016-04-21 DIAGNOSIS — I10 Essential (primary) hypertension: Secondary | ICD-10-CM

## 2016-04-21 MED ORDER — FUROSEMIDE 80 MG PO TABS: 40.0000 mg | ORAL_TABLET | Freq: Every day | ORAL | 0 refills | Status: DC

## 2016-04-21 NOTE — Telephone Encounter (Signed)
Vladimir Faster faxed [place] that patient medication Furosemide 40 mg was on backorder and they wanted to know could they get a new script for either 20 mg or 80 mg?

## 2016-05-20 ENCOUNTER — Emergency Department: Payer: Medicare Other

## 2016-05-20 ENCOUNTER — Encounter: Payer: Self-pay | Admitting: Emergency Medicine

## 2016-05-20 DIAGNOSIS — I509 Heart failure, unspecified: Secondary | ICD-10-CM | POA: Diagnosis not present

## 2016-05-20 DIAGNOSIS — G4733 Obstructive sleep apnea (adult) (pediatric): Secondary | ICD-10-CM | POA: Diagnosis present

## 2016-05-20 DIAGNOSIS — Z23 Encounter for immunization: Secondary | ICD-10-CM

## 2016-05-20 DIAGNOSIS — N183 Chronic kidney disease, stage 3 (moderate): Secondary | ICD-10-CM | POA: Diagnosis not present

## 2016-05-20 DIAGNOSIS — I110 Hypertensive heart disease with heart failure: Secondary | ICD-10-CM | POA: Diagnosis not present

## 2016-05-20 DIAGNOSIS — Z7982 Long term (current) use of aspirin: Secondary | ICD-10-CM

## 2016-05-20 DIAGNOSIS — E785 Hyperlipidemia, unspecified: Secondary | ICD-10-CM | POA: Diagnosis present

## 2016-05-20 DIAGNOSIS — I5031 Acute diastolic (congestive) heart failure: Secondary | ICD-10-CM | POA: Diagnosis present

## 2016-05-20 DIAGNOSIS — R0602 Shortness of breath: Secondary | ICD-10-CM | POA: Diagnosis not present

## 2016-05-20 DIAGNOSIS — I130 Hypertensive heart and chronic kidney disease with heart failure and stage 1 through stage 4 chronic kidney disease, or unspecified chronic kidney disease: Secondary | ICD-10-CM | POA: Diagnosis not present

## 2016-05-20 DIAGNOSIS — M109 Gout, unspecified: Secondary | ICD-10-CM | POA: Diagnosis present

## 2016-05-20 DIAGNOSIS — R0600 Dyspnea, unspecified: Secondary | ICD-10-CM | POA: Diagnosis not present

## 2016-05-20 DIAGNOSIS — Z794 Long term (current) use of insulin: Secondary | ICD-10-CM

## 2016-05-20 DIAGNOSIS — E1122 Type 2 diabetes mellitus with diabetic chronic kidney disease: Secondary | ICD-10-CM | POA: Diagnosis not present

## 2016-05-20 DIAGNOSIS — R609 Edema, unspecified: Secondary | ICD-10-CM | POA: Diagnosis present

## 2016-05-20 DIAGNOSIS — J4540 Moderate persistent asthma, uncomplicated: Secondary | ICD-10-CM | POA: Diagnosis present

## 2016-05-20 DIAGNOSIS — Z9119 Patient's noncompliance with other medical treatment and regimen: Secondary | ICD-10-CM

## 2016-05-20 DIAGNOSIS — Z79899 Other long term (current) drug therapy: Secondary | ICD-10-CM

## 2016-05-20 DIAGNOSIS — I499 Cardiac arrhythmia, unspecified: Secondary | ICD-10-CM | POA: Diagnosis present

## 2016-05-20 DIAGNOSIS — Z6841 Body Mass Index (BMI) 40.0 and over, adult: Secondary | ICD-10-CM

## 2016-05-20 NOTE — ED Triage Notes (Signed)
Pt to triage via w/c with no distress noted; pt reports "feeling light-headed", swelling to feet and SHOB x 3weeks

## 2016-05-21 ENCOUNTER — Inpatient Hospital Stay
Admission: EM | Admit: 2016-05-21 | Discharge: 2016-05-22 | DRG: 291 | Disposition: A | Discharge status: Home / Self Care | Payer: Medicare Other | Attending: Internal Medicine | Admitting: Internal Medicine

## 2016-05-21 ENCOUNTER — Emergency Department: Payer: Medicare Other

## 2016-05-21 ENCOUNTER — Encounter: Payer: Self-pay | Admitting: Radiology

## 2016-05-21 ENCOUNTER — Inpatient Hospital Stay
Admit: 2016-05-21 | Discharge: 2016-05-21 | Disposition: A | Discharge status: Home / Self Care | Payer: Medicare Other | Attending: Internal Medicine | Admitting: Internal Medicine

## 2016-05-21 DIAGNOSIS — I509 Heart failure, unspecified: Secondary | ICD-10-CM

## 2016-05-21 DIAGNOSIS — J4540 Moderate persistent asthma, uncomplicated: Secondary | ICD-10-CM | POA: Diagnosis not present

## 2016-05-21 DIAGNOSIS — Z9119 Patient's noncompliance with other medical treatment and regimen: Secondary | ICD-10-CM | POA: Diagnosis not present

## 2016-05-21 DIAGNOSIS — M109 Gout, unspecified: Secondary | ICD-10-CM | POA: Diagnosis present

## 2016-05-21 DIAGNOSIS — Z79899 Other long term (current) drug therapy: Secondary | ICD-10-CM | POA: Diagnosis not present

## 2016-05-21 DIAGNOSIS — R0600 Dyspnea, unspecified: Secondary | ICD-10-CM | POA: Diagnosis not present

## 2016-05-21 DIAGNOSIS — G4733 Obstructive sleep apnea (adult) (pediatric): Secondary | ICD-10-CM

## 2016-05-21 DIAGNOSIS — Z7982 Long term (current) use of aspirin: Secondary | ICD-10-CM | POA: Diagnosis not present

## 2016-05-21 DIAGNOSIS — N183 Chronic kidney disease, stage 3 (moderate): Secondary | ICD-10-CM | POA: Diagnosis present

## 2016-05-21 DIAGNOSIS — I10 Essential (primary) hypertension: Secondary | ICD-10-CM | POA: Diagnosis not present

## 2016-05-21 DIAGNOSIS — I110 Hypertensive heart disease with heart failure: Secondary | ICD-10-CM

## 2016-05-21 DIAGNOSIS — E669 Obesity, unspecified: Secondary | ICD-10-CM

## 2016-05-21 DIAGNOSIS — E1122 Type 2 diabetes mellitus with diabetic chronic kidney disease: Secondary | ICD-10-CM | POA: Diagnosis present

## 2016-05-21 DIAGNOSIS — I119 Hypertensive heart disease without heart failure: Secondary | ICD-10-CM

## 2016-05-21 DIAGNOSIS — R0602 Shortness of breath: Secondary | ICD-10-CM | POA: Diagnosis not present

## 2016-05-21 DIAGNOSIS — I130 Hypertensive heart and chronic kidney disease with heart failure and stage 1 through stage 4 chronic kidney disease, or unspecified chronic kidney disease: Secondary | ICD-10-CM | POA: Diagnosis present

## 2016-05-21 DIAGNOSIS — Z23 Encounter for immunization: Secondary | ICD-10-CM | POA: Diagnosis not present

## 2016-05-21 DIAGNOSIS — I5031 Acute diastolic (congestive) heart failure: Secondary | ICD-10-CM | POA: Diagnosis not present

## 2016-05-21 DIAGNOSIS — I872 Venous insufficiency (chronic) (peripheral): Secondary | ICD-10-CM

## 2016-05-21 DIAGNOSIS — R0603 Acute respiratory distress: Secondary | ICD-10-CM | POA: Diagnosis present

## 2016-05-21 DIAGNOSIS — Z6841 Body Mass Index (BMI) 40.0 and over, adult: Secondary | ICD-10-CM | POA: Diagnosis not present

## 2016-05-21 DIAGNOSIS — R609 Edema, unspecified: Secondary | ICD-10-CM | POA: Diagnosis present

## 2016-05-21 DIAGNOSIS — I5033 Acute on chronic diastolic (congestive) heart failure: Secondary | ICD-10-CM

## 2016-05-21 DIAGNOSIS — Z794 Long term (current) use of insulin: Secondary | ICD-10-CM | POA: Diagnosis not present

## 2016-05-21 DIAGNOSIS — N182 Chronic kidney disease, stage 2 (mild): Secondary | ICD-10-CM | POA: Diagnosis not present

## 2016-05-21 DIAGNOSIS — I499 Cardiac arrhythmia, unspecified: Secondary | ICD-10-CM | POA: Diagnosis present

## 2016-05-21 DIAGNOSIS — E785 Hyperlipidemia, unspecified: Secondary | ICD-10-CM | POA: Diagnosis present

## 2016-05-21 DIAGNOSIS — I491 Atrial premature depolarization: Secondary | ICD-10-CM

## 2016-05-21 HISTORY — DX: Atrial premature depolarization: I49.1

## 2016-05-21 HISTORY — DX: Chronic diastolic (congestive) heart failure: I50.32

## 2016-05-21 HISTORY — DX: Disorder of kidney and ureter, unspecified: N28.9

## 2016-05-21 HISTORY — DX: Obesity, unspecified: E66.9

## 2016-05-21 HISTORY — DX: Chronic kidney disease, stage 3 unspecified: N18.30

## 2016-05-21 HISTORY — DX: Venous insufficiency (chronic) (peripheral): I87.2

## 2016-05-21 HISTORY — DX: Chronic kidney disease, stage 3 (moderate): N18.3

## 2016-05-21 LAB — ECHOCARDIOGRAM COMPLETE
AOPV: 0.52 m/s
AVAREAVTI: 1.99 cm2
AVPG: 9 mmHg
AVPKVEL: 151 cm/s
CHL CUP AV PEAK INDEX: 0.71
E decel time: 261 msec
EERAT: 9.83
FS: 30 % (ref 28–44)
Height: 70 in
IVS/LV PW RATIO, ED: 0.9
LA diam end sys: 41 mm
LA vol index: 24.4 mL/m2
LADIAMINDEX: 1.47 cm/m2
LASIZE: 41 mm
LAVOL: 68.2 mL
LAVOLA4C: 75.4 mL
LDCA: 3.8 cm2
LV PW d: 12.9 mm — AB (ref 0.6–1.1)
LV TDI E'LATERAL: 9.57
LV e' LATERAL: 9.57 cm/s
LVEEAVG: 9.83
LVEEMED: 9.83
LVOT diameter: 22 mm
LVOT peak vel: 79.1 cm/s
MV Dec: 261
MVPG: 4 mmHg
MVPKEVEL: 94.1 m/s
TDI e' medial: 7.72
Weight: 6360 oz

## 2016-05-21 LAB — CREATININE, SERUM
CREATININE: 1.44 mg/dL — AB (ref 0.44–1.00)
GFR calc Af Amer: 42 mL/min — ABNORMAL LOW (ref >60)
GFR, EST NON AFRICAN AMERICAN: 36 mL/min — AB (ref >60)

## 2016-05-21 LAB — GLUCOSE, CAPILLARY
GLUCOSE-CAPILLARY: 206 mg/dL — AB (ref 65–99)
GLUCOSE-CAPILLARY: 165 mg/dL — AB (ref 65–99)
GLUCOSE-CAPILLARY: 180 mg/dL — AB (ref 65–99)
Glucose-Capillary: 124 mg/dL — ABNORMAL HIGH (ref 65–99)
Glucose-Capillary: 50 mg/dL — ABNORMAL LOW (ref 65–99)
Glucose-Capillary: 176 mg/dL — ABNORMAL HIGH (ref 65–99)

## 2016-05-21 LAB — BRAIN NATRIURETIC PEPTIDE: B Natriuretic Peptide: 198 pg/mL — ABNORMAL HIGH (ref 0.0–100.0)

## 2016-05-21 LAB — CBC
HCT: 32.7 % — ABNORMAL LOW (ref 35.0–47.0)
HCT: 33.5 % — ABNORMAL LOW (ref 35.0–47.0)
HEMOGLOBIN: 11.1 g/dL — AB (ref 12.0–16.0)
Hemoglobin: 11.3 g/dL — ABNORMAL LOW (ref 12.0–16.0)
MCH: 28.4 pg (ref 26.0–34.0)
MCH: 29.4 pg (ref 26.0–34.0)
MCHC: 33.2 g/dL (ref 32.0–36.0)
MCHC: 34.7 g/dL (ref 32.0–36.0)
MCV: 84.7 fL (ref 80.0–100.0)
MCV: 85.8 fL (ref 80.0–100.0)
PLATELETS: 223 10*3/uL (ref 150–440)
PLATELETS: 232 10*3/uL (ref 150–440)
RBC: 3.86 MIL/uL (ref 3.80–5.20)
RBC: 3.91 MIL/uL (ref 3.80–5.20)
RDW: 15.9 % — AB (ref 11.5–14.5)
RDW: 16.1 % — ABNORMAL HIGH (ref 11.5–14.5)
WBC: 11.3 10*3/uL — AB (ref 3.6–11.0)
WBC: 9.6 10*3/uL (ref 3.6–11.0)

## 2016-05-21 LAB — BASIC METABOLIC PANEL
Anion gap: 11 (ref 5–15)
BUN: 29 mg/dL — AB (ref 6–20)
CALCIUM: 8.9 mg/dL (ref 8.9–10.3)
CO2: 23 mmol/L (ref 22–32)
CREATININE: 1.46 mg/dL — AB (ref 0.44–1.00)
Chloride: 102 mmol/L (ref 101–111)
GFR calc Af Amer: 41 mL/min — ABNORMAL LOW (ref >60)
GFR, EST NON AFRICAN AMERICAN: 36 mL/min — AB (ref >60)
GLUCOSE: 158 mg/dL — AB (ref 65–99)
Potassium: 4.6 mmol/L (ref 3.5–5.1)
SODIUM: 136 mmol/L (ref 135–145)

## 2016-05-21 LAB — FIBRIN DERIVATIVES D-DIMER (ARMC ONLY): Fibrin derivatives D-dimer (ARMC): 663 — ABNORMAL HIGH (ref 0–499)

## 2016-05-21 LAB — TROPONIN I
Troponin I: <0.03 ng/mL (ref <0.03)
Troponin I: <0.03 ng/mL (ref <0.03)
Troponin I: <0.03 ng/mL (ref <0.03)

## 2016-05-21 MED ORDER — INFLUENZA VAC SPLIT QUAD 0.5 ML IM SUSY
0.5000 mL | PREFILLED_SYRINGE | INTRAMUSCULAR | Status: AC
  Administered 2016-05-22: 0.5 mL via INTRAMUSCULAR
  Filled 2016-05-21: qty 0.5

## 2016-05-21 MED ORDER — IOPAMIDOL (ISOVUE-370) INJECTION 76%
75.0000 mL | Freq: Once | INTRAVENOUS | Status: AC | PRN
  Administered 2016-05-21: 75 mL via INTRAVENOUS

## 2016-05-21 MED ORDER — PERFLUTREN LIPID MICROSPHERE
1.0000 mL | INTRAVENOUS | Status: AC | PRN
  Administered 2016-05-21: 2 mL via INTRAVENOUS
  Filled 2016-05-21: qty 10

## 2016-05-21 MED ORDER — IPRATROPIUM-ALBUTEROL 0.5-2.5 (3) MG/3ML IN SOLN
RESPIRATORY_TRACT | Status: AC
  Filled 2016-05-21: qty 3

## 2016-05-21 MED ORDER — HYDRALAZINE HCL 50 MG PO TABS
50.0000 mg | ORAL_TABLET | Freq: Two times a day (BID) | ORAL | Status: DC
  Administered 2016-05-21 – 2016-05-22 (×2): 50 mg via ORAL
  Filled 2016-05-21 (×2): qty 1

## 2016-05-21 MED ORDER — PRAVASTATIN SODIUM 40 MG PO TABS
80.0000 mg | ORAL_TABLET | Freq: Every day | ORAL | Status: DC
  Administered 2016-05-21 – 2016-05-22 (×2): 80 mg via ORAL
  Filled 2016-05-21 (×3): qty 2

## 2016-05-21 MED ORDER — ASPIRIN EC 81 MG PO TBEC
81.0000 mg | DELAYED_RELEASE_TABLET | Freq: Every day | ORAL | Status: DC
  Administered 2016-05-22: 81 mg via ORAL
  Filled 2016-05-21 (×2): qty 1

## 2016-05-21 MED ORDER — VITAMIN D 1000 UNITS PO TABS
2000.0000 [IU] | ORAL_TABLET | Freq: Every day | ORAL | Status: DC
  Administered 2016-05-21 – 2016-05-22 (×2): 2000 [IU] via ORAL
  Filled 2016-05-21 (×2): qty 2

## 2016-05-21 MED ORDER — INSULIN ASPART 100 UNIT/ML ~~LOC~~ SOLN: 0.0000 [IU] | Freq: Every day | SUBCUTANEOUS | Status: DC

## 2016-05-21 MED ORDER — FUROSEMIDE 10 MG/ML IJ SOLN
60.0000 mg | Freq: Once | INTRAMUSCULAR | Status: AC
  Administered 2016-05-21: 60 mg via INTRAVENOUS
  Filled 2016-05-21: qty 8

## 2016-05-21 MED ORDER — SODIUM CHLORIDE 0.9% FLUSH
3.0000 mL | INTRAVENOUS | Status: DC | PRN
  Filled 2016-05-21: qty 3

## 2016-05-21 MED ORDER — DILTIAZEM HCL ER COATED BEADS 180 MG PO CP24
300.0000 mg | ORAL_CAPSULE | Freq: Every day | ORAL | Status: DC
  Administered 2016-05-21 – 2016-05-22 (×2): 300 mg via ORAL
  Filled 2016-05-21 (×2): qty 1

## 2016-05-21 MED ORDER — ALLOPURINOL 100 MG PO TABS
100.0000 mg | ORAL_TABLET | Freq: Two times a day (BID) | ORAL | Status: DC
  Administered 2016-05-21 – 2016-05-22 (×3): 100 mg via ORAL
  Filled 2016-05-21 (×3): qty 1

## 2016-05-21 MED ORDER — INSULIN LISPRO PROT & LISPRO (75-25 MIX) 100 UNIT/ML KWIKPEN: 40.0000 [IU] | PEN_INJECTOR | Freq: Two times a day (BID) | SUBCUTANEOUS | Status: DC

## 2016-05-21 MED ORDER — ALBUTEROL SULFATE HFA 108 (90 BASE) MCG/ACT IN AERS: 2.0000 | INHALATION_SPRAY | Freq: Four times a day (QID) | RESPIRATORY_TRACT | Status: DC | PRN

## 2016-05-21 MED ORDER — INSULIN ASPART PROT & ASPART (70-30 MIX) 100 UNIT/ML ~~LOC~~ SUSP
60.0000 [IU] | Freq: Every day | SUBCUTANEOUS | Status: DC
  Administered 2016-05-21 – 2016-05-22 (×2): 60 [IU] via SUBCUTANEOUS
  Filled 2016-05-21 (×2): qty 60

## 2016-05-21 MED ORDER — SODIUM CHLORIDE 0.9 % IV SOLN: 250.0000 mL | INTRAVENOUS | Status: DC | PRN

## 2016-05-21 MED ORDER — SODIUM CHLORIDE 0.9% FLUSH
3.0000 mL | Freq: Two times a day (BID) | INTRAVENOUS | Status: DC
  Administered 2016-05-21 – 2016-05-22 (×3): 3 mL via INTRAVENOUS

## 2016-05-21 MED ORDER — FLUTICASONE PROPIONATE 50 MCG/ACT NA SUSP
2.0000 | Freq: Every day | NASAL | Status: DC
  Administered 2016-05-21 – 2016-05-22 (×2): 2 via NASAL
  Filled 2016-05-21: qty 16

## 2016-05-21 MED ORDER — ALBUTEROL SULFATE (2.5 MG/3ML) 0.083% IN NEBU: 2.5000 mg | INHALATION_SOLUTION | Freq: Four times a day (QID) | RESPIRATORY_TRACT | Status: DC | PRN

## 2016-05-21 MED ORDER — ASPIRIN EC 325 MG PO TBEC
325.0000 mg | DELAYED_RELEASE_TABLET | Freq: Every day | ORAL | Status: DC
  Administered 2016-05-21: 325 mg via ORAL
  Filled 2016-05-21: qty 1

## 2016-05-21 MED ORDER — POTASSIUM CHLORIDE ER 20 MEQ PO TBCR: 1.0000 | EXTENDED_RELEASE_TABLET | Freq: Every day | ORAL | Status: DC

## 2016-05-21 MED ORDER — CAMPHOR-MENTHOL 0.5-0.5 % EX LOTN
TOPICAL_LOTION | Freq: Three times a day (TID) | CUTANEOUS | Status: DC | PRN
  Filled 2016-05-21: qty 222

## 2016-05-21 MED ORDER — FUROSEMIDE 10 MG/ML IJ SOLN
60.0000 mg | Freq: Two times a day (BID) | INTRAMUSCULAR | Status: DC
  Administered 2016-05-21 (×2): 60 mg via INTRAVENOUS
  Filled 2016-05-21 (×2): qty 6

## 2016-05-21 MED ORDER — INSULIN ASPART 100 UNIT/ML ~~LOC~~ SOLN
0.0000 [IU] | Freq: Three times a day (TID) | SUBCUTANEOUS | Status: DC
  Administered 2016-05-21: 4 [IU] via SUBCUTANEOUS
  Administered 2016-05-21: 7 [IU] via SUBCUTANEOUS
  Administered 2016-05-22: 3 [IU] via SUBCUTANEOUS
  Filled 2016-05-21: qty 3
  Filled 2016-05-21: qty 4
  Filled 2016-05-21: qty 7

## 2016-05-21 MED ORDER — IPRATROPIUM-ALBUTEROL 0.5-2.5 (3) MG/3ML IN SOLN
3.0000 mL | Freq: Once | RESPIRATORY_TRACT | Status: AC
  Administered 2016-05-21: 3 mL via RESPIRATORY_TRACT

## 2016-05-21 MED ORDER — ONDANSETRON HCL 4 MG/2ML IJ SOLN: 4.0000 mg | Freq: Four times a day (QID) | INTRAMUSCULAR | Status: DC | PRN

## 2016-05-21 MED ORDER — IRBESARTAN 150 MG PO TABS
300.0000 mg | ORAL_TABLET | Freq: Every day | ORAL | Status: DC
  Administered 2016-05-21 – 2016-05-22 (×2): 300 mg via ORAL
  Filled 2016-05-21 (×2): qty 2

## 2016-05-21 MED ORDER — INSULIN ASPART PROT & ASPART (70-30 MIX) 100 UNIT/ML ~~LOC~~ SUSP: 40.0000 [IU] | Freq: Every day | SUBCUTANEOUS | Status: DC

## 2016-05-21 MED ORDER — BUDESONIDE 0.25 MG/2ML IN SUSP
0.2500 mg | Freq: Two times a day (BID) | RESPIRATORY_TRACT | Status: DC
  Administered 2016-05-21 – 2016-05-22 (×3): 0.25 mg via RESPIRATORY_TRACT
  Filled 2016-05-21 (×3): qty 2

## 2016-05-21 MED ORDER — ACETAMINOPHEN 325 MG PO TABS
650.0000 mg | ORAL_TABLET | ORAL | Status: DC | PRN
  Administered 2016-05-21: 650 mg via ORAL
  Filled 2016-05-21: qty 2
  Filled 2016-05-21: qty 1

## 2016-05-21 MED ORDER — COLCHICINE 0.6 MG PO TABS
0.6000 mg | ORAL_TABLET | Freq: Every day | ORAL | Status: DC
  Administered 2016-05-21 – 2016-05-22 (×2): 0.6 mg via ORAL
  Filled 2016-05-21 (×2): qty 1

## 2016-05-21 MED ORDER — ENOXAPARIN SODIUM 40 MG/0.4ML ~~LOC~~ SOLN
40.0000 mg | Freq: Two times a day (BID) | SUBCUTANEOUS | Status: DC
  Administered 2016-05-21 – 2016-05-22 (×3): 40 mg via SUBCUTANEOUS
  Filled 2016-05-21 (×3): qty 0.4

## 2016-05-21 MED ORDER — ENOXAPARIN SODIUM 40 MG/0.4ML ~~LOC~~ SOLN: 40.0000 mg | SUBCUTANEOUS | Status: DC

## 2016-05-21 MED ORDER — PERFLUTREN LIPID MICROSPHERE
INTRAVENOUS | Status: AC
  Filled 2016-05-21: qty 10

## 2016-05-21 MED ORDER — HYDRALAZINE HCL 10 MG PO TABS
10.0000 mg | ORAL_TABLET | Freq: Two times a day (BID) | ORAL | Status: DC
  Administered 2016-05-21: 10 mg via ORAL
  Filled 2016-05-21 (×2): qty 1

## 2016-05-21 NOTE — Progress Notes (Signed)
  Echocardiogram 2D Echocardiogram has been performed.  Jennette Dubin 05/21/2016, 2:34 PM

## 2016-05-21 NOTE — Care Management Important Message (Signed)
Important Message  Patient Details  Name: Amber Reyes MRN: [id number] Date of Birth: 1945-11-13   Medicare Important Message Given:  Yes    Katrina Stack, RN 05/21/2016, 11:20 AM

## 2016-05-21 NOTE — Care Management (Signed)
Patient admitted with new onset congestive failure.  Currently her 02 sats on room air 98-100%.  A referral has been maed to the heart failure clinic.  Cardiology consult is pending.  She is current with her pcp Dr Allie Bossier.  Independent in all adls, denies issues accessing medical care, obtaining medications or with transportation.

## 2016-05-21 NOTE — H&P (Signed)
[place] [facility] [place] NAME: Amber Reyes    MR#:  [id number]  DATE OF BIRTH:  Apr 04, 1946  DATE OF ADMISSION:  05/21/2016  PRIMARY CARE PHYSICIAN: Loistine Chance, MD   REQUESTING/REFERRING PHYSICIAN:   CHIEF COMPLAINT:  No chief complaint on file.   HISTORY OF PRESENT ILLNESS: Amber Reyes  is a 70 y.o. female with a known history of atrial fibrillation, chronic kidney disease, hypertension, gout, renal insufficiency, diabetes with this type II, sleep apnea, vitamin D deficiency presented to the emergency room with increased swelling in both legs for the last 3 weeks. Patient says she takes Lasix as an outpatient on an on needed basis. Patient says she does not have a history of heart failure and does has not been diagnosed in the past. She gradually noticed increased swelling in both lower extremities and has been retaining fluid for the last few weeks. Has difficulty breathing and orthopnea. No complaints of any chest pain. No complaints of abdominal pain nausea vomiting diarrhea. No history of headache dizziness blurry vision. No history of syncope or seizure. Patient was evaluated in the emergency room and was given IV Lasix and hospitalist service was consulted for further care of the patient. She had elevated d-dimer and she was worked up with a CT angiogram of the chest which showed no pulmonary embolism.  PAST MEDICAL HISTORY:   Past Medical History:  Diagnosis Date  . A-fib ([place])   . Allergic rhinitis, cause unspecified   . Anxiety   . Arrhythmia    afib  . Chronic kidney disease, unspecified ([place])   . Edema   . Essential hypertension, benign   . Gout, unspecified   . Heart murmur    as child  . Hyperlipidemia   . Lipoma of unspecified site   . Obesity, unspecified   . Other and unspecified disc disorder of lumbar region   . Other general symptoms(780.99)   . Renal insufficiency   . Restless legs syndrome (RLS)    . Sebaceous cyst   . Type II or unspecified type diabetes mellitus without mention of complication, uncontrolled   . Unspecified asthma(493.90)   . Unspecified disease of pericardium   . Unspecified sleep apnea   . Unspecified vitamin D deficiency   . Vaginitis and vulvovaginitis, unspecified     PAST SURGICAL HISTORY: Past Surgical History:  Procedure Laterality Date  . CARDIAC CATHETERIZATION  2002   [facility]  . PERICARDIUM SURGERY      SOCIAL HISTORY:  Social History  Substance Use Topics  . Smoking status: Never Smoker  . Smokeless tobacco: Never Used  . Alcohol use No    FAMILY HISTORY:  Family History  Problem Relation Age of Onset  . Heart attack Mother   . Hypertension Mother     DRUG ALLERGIES:  Allergies  Allergen Reactions  . Ace Inhibitors     REVIEW OF SYSTEMS:   CONSTITUTIONAL: No fever, has weakness.  EYES: No blurred or double vision.  EARS, NOSE, AND THROAT: No tinnitus or ear pain.  RESPIRATORY: No cough,has  shortness of breath, No  wheezing or hemoptysis.  CARDIOVASCULAR: No chest pain,  Has orthopnea, edema.  GASTROINTESTINAL: No nausea, vomiting, diarrhea or abdominal pain.  GENITOURINARY: No dysuria, hematuria.  ENDOCRINE: No polyuria, nocturia,  HEMATOLOGY: No anemia, easy bruising or bleeding SKIN: No rash or lesion. MUSCULOSKELETAL: No joint pain or arthritis.  Selling in legs noted.  NEUROLOGIC: No tingling, numbness, weakness.  PSYCHIATRY: No anxiety or depression.   MEDICATIONS AT HOME:  Prior to Admission medications   Medication Sig Start Date End Date Taking? Authorizing Provider  albuterol (PROVENTIL HFA;VENTOLIN HFA) 108 (90 BASE) MCG/ACT inhaler Inhale 2 puffs into the lungs every 6 (six) hours as needed for wheezing or shortness of breath.    Historical Provider, MD  allopurinol (ZYLOPRIM) 100 MG tablet Take 1 tablet (100 mg total) by mouth 2 (two) times daily. 04/20/16   Steele Sizer, MD  aspirin EC 325 MG tablet Take 1  tablet (325 mg total) by mouth daily. 12/18/15   Steele Sizer, MD  budesonide (PULMICORT) 0.25 MG/2ML nebulizer solution Take 2 mLs (0.25 mg total) by nebulization 2 (two) times daily. 04/20/16   Steele Sizer, MD  Cholecalciferol (VITAMIN D) 2000 UNITS tablet Take 2,000 Units by mouth daily.    Historical Provider, MD  COLCRYS 0.6 MG tablet TAKE ONE TABLET BY MOUTH TWICE DAILY AS NEEDED FOR GOUT 10/12/15   Steele Sizer, MD  diltiazem (CARDIZEM CD) 300 MG 24 hr capsule Take 1 capsule (300 mg total) by mouth daily. 04/20/16   Steele Sizer, MD  Elastic Bandages & Supports (MEDICAL COMPRESSION STOCKINGS) [place] 2 each by Does not apply route daily. 04/20/16   Steele Sizer, MD  fluticasone (FLONASE) 50 MCG/ACT nasal spray Place 2 sprays into both nostrils daily. 12/18/15   Steele Sizer, MD  furosemide (LASIX) 80 MG tablet Take 0.5 tablets (40 mg total) by mouth daily. 04/21/16   Steele Sizer, MD  hydrALAZINE (APRESOLINE) 10 MG tablet Take 1 tablet (10 mg total) by mouth 2 (two) times daily. 04/20/16   Steele Sizer, MD  Insulin Lispro Prot & Lispro (HUMALOG MIX 75/25 KWIKPEN) (75-25) 100 UNIT/ML Kwikpen Inject 40-60 Units into the skin 2 (two) times daily. 04/20/16   Steele Sizer, MD  Insulin Pen Needle (NOVOFINE) 30G X 8 MM MISC Inject 10 each into the skin daily. 04/20/16   Steele Sizer, MD  irbesartan (AVAPRO) 300 MG tablet Take 1 tablet (300 mg total) by mouth daily. 04/20/16   Steele Sizer, MD  Potassium Chloride ER 20 MEQ TBCR Take 1 tablet by mouth daily. 04/20/16   Steele Sizer, MD  pravastatin (PRAVACHOL) 80 MG tablet Take 1 tablet (80 mg total) by mouth daily. 04/20/16   Steele Sizer, MD      PHYSICAL EXAMINATION:   VITAL SIGNS: Blood pressure (!) 164/110, pulse 67, temperature 98.1 F (36.7 C), temperature source Oral, resp. rate 17, height 5\' 10"  (1.778 m), weight (!) 181 kg (399 lb), SpO2 99 %.  GENERAL:  70 y.o.-year- old obese female patient lying in the bed in mild  distress.  EYES: Pupils equal, round, reactive to light and accommodation. No scleral icterus. Extraocular muscles intact.  HEENT: Head atraumatic, normocephalic. Oropharynx and nasopharynx clear.  NECK:  Supple, no jugular venous distention. No thyroid enlargement, no tenderness.  LUNGS: Decreased breath sounds bilaterally, bibasilar crepitations heard. No use of accessory muscles of respiration.  CARDIOVASCULAR: S1, S2 normal. No murmurs, rubs, or gallops.  ABDOMEN: Soft, obese, nontender, nondistended. Bowel sounds present. No organomegaly or mass.  EXTREMITIES: Bilateral pedal edema noted, no cyanosis, or clubbing.  NEUROLOGIC: Cranial nerves II through XII are intact. Muscle strength 5/5 in all extremities. Sensation intact. Gait not checked.  PSYCHIATRIC: The patient is alert and oriented x 3.  SKIN: No obvious rash, lesion, or ulcer.   LABORATORY PANEL:   CBC  Recent Labs Lab 05/20/16 2341  WBC 11.3*  HGB  11.1*  HCT 33.5*  PLT 232  MCV 85.8  MCH 28.4  MCHC 33.2  RDW 16.1*   ------------------------------------------------------------------------------------------------------------------  Chemistries   Recent Labs Lab 05/20/16 2341  NA 136  K 4.6  CL 102  CO2 23  GLUCOSE 158*  BUN 29*  CREATININE 1.46*  CALCIUM 8.9   ------------------------------------------------------------------------------------------------------------------ estimated creatinine clearance is 66.1 mL/min (by C-G formula based on SCr of 1.46 mg/dL (H)). ------------------------------------------------------------------------------------------------------------------ No results for input(s): TSH, T4TOTAL, T3FREE, THYROIDAB in the last 72 hours.  Invalid input(s): FREET3   Coagulation profile No results for input(s): INR, PROTIME in the last 168 hours. ------------------------------------------------------------------------------------------------------------------- No results for  input(s): DDIMER in the last 72 hours. -------------------------------------------------------------------------------------------------------------------  Cardiac Enzymes  Recent Labs Lab 05/20/16 2341  TROPONINI <0.03   ------------------------------------------------------------------------------------------------------------------ Invalid input(s): POCBNP  ---------------------------------------------------------------------------------------------------------------  Urinalysis No results found for: COLORURINE, APPEARANCEUR, LABSPEC, PHURINE, GLUCOSEU, HGBUR, BILIRUBINUR, KETONESUR, PROTEINUR, UROBILINOGEN, NITRITE, LEUKOCYTESUR   RADIOLOGY: Dg Chest 2 View  Result Date: 05/20/2016 CLINICAL DATA:  Shortness of breath for 3 weeks. EXAM: CHEST  2 VIEW COMPARISON:  01/06/2016 FINDINGS: Mild cardiac enlargement. No pleural effusion or edema identified. No airspace consolidation. The visualized osseous structures are unremarkable. IMPRESSION: 1. No acute cardiopulmonary abnormalities. Electronically Signed   By: Kerby Moors M.D.   On: 05/20/2016 23:59   Ct Angio Chest Pe W And/or Wo Contrast  Result Date: 05/21/2016 CLINICAL DATA:  70 year old female with shortness of breath. EXAM: CT ANGIOGRAPHY CHEST WITH CONTRAST TECHNIQUE: Multidetector CT imaging of the chest was performed using the standard protocol during bolus administration of intravenous contrast. Multiplanar CT image reconstructions and MIPs were obtained to evaluate the vascular anatomy. CONTRAST:  100 cc Isovue 370 COMPARISON:  Chest radiograph dated 05/20/2016 FINDINGS: A 3 mm subpleural nodule in the right upper lobe versus with a vessel on-end and (series 6 image 161). The lungs are otherwise clear. There is no pleural effusion or pneumothorax. The central airways are patent. There is moderate atherosclerotic calcification of the thoracic aorta. There is no aneurysmal dilatation or evidence of dissection. The origins of  the great vessels of the aortic arch appear patent. Evaluation of the pulmonary arteries is limited due to suboptimal opacification and timing of the contrast. No definite central pulmonary artery embolus identified. V/Q scan may provide additional information if there is high clinical concern for pulmonary embolism. Mild cardiomegaly. No pericardial effusion. There is coronary vascular calcification. There is no hilar or mediastinal adenopathy. The esophagus is grossly unremarkable. No thyroid nodules identified. There is no axillary adenopathy. The chest wall soft tissues appear unremarkable. There is degenerative changes of the spine. No acute fracture. Layering stone noted within the gallbladder. The visualized upper abdomen is otherwise unremarkable. IMPRESSION: No definite CT evidence of central pulmonary artery embolus on suboptimal evaluation of the pulmonary arteries. V/Q scan may provide better evaluation if there is high clinical concern for PE. No other acute intrathoracic pathology identified. Cholelithiasis. Electronically Signed   By: Anner Crete M.D.   On: 05/21/2016 04:38    EKG: Orders placed or performed during the hospital encounter of 05/21/16  . ED EKG  . ED EKG  . EKG 12-Lead  . EKG 12-Lead    IMPRESSION AND PLAN: 70 year old obese female patient with history of chronic kidney disease, atrial fibrillation, hypertension, hyperlipidemia, type 2 diabetes meters, gout presented to the emergency room with swelling in both the lower extremities and difficulty breathing. Admitting diagnosis 1. New onset congestive heart failure 2. Lower extremity edema  probably secondary to heart failure 3. Dyspnea 4. Chronic kidney disease 5. Hypertension Treatment plan Admit patient to telemetry inpatient service Diurese patient with IV Lasix 60 mg every 12 hourly Check echocardiogram because his LV function Low salt diet Medical management of diabetes Resume cardiac  medications. Cycle troponin to rule out ischemia.  All the records are reviewed and case discussed with ED provider. Management plans discussed with the patient, family and they are in agreement.  CODE STATUS:FULL Code Status History    This patient does not have a recorded code status. Please follow your organizational policy for patients in this situation.       TOTAL TIME TAKING CARE OF THIS PATIENT: 57 minutes.    Saundra Shelling M.D on 05/21/2016 at 6:38 AM  Between 7am to 6pm - Pager - [phone]  After 6pm go to [email or web address] - password [email or web address]  Office  [phone]  CC: Primary care physician; Loistine Chance, MD

## 2016-05-21 NOTE — Progress Notes (Signed)
CSW received consult for patient needing assistance with medication. CSW informed RNCM. CSW is signing off but is available if a CSW need were to arise.  Ernest Pine, MSW, LCSW, [place] Clinical Social Worker [phone]

## 2016-05-21 NOTE — ED Provider Notes (Signed)
[facility] Emergency Department Provider Note    First MD Initiated Contact with Patient 05/21/16 0330     (approximate)  I have reviewed the triage vital signs and the nursing notes.   HISTORY  Chief Complaint No chief complaint on file.    HPI Amber Reyes is a 70 y.o. female with history of chronic kidney disease hyperlipidemia morbid obesity hypertension presents with 3 week history of progressive bilateral extremity swelling and dyspnea 3 weeks unrelieved with Lasix at home. Patient states that dyspnea is worse with exertion. Patient also admits to orthopnea. Patient denies any chest pain   Past Medical History:  Diagnosis Date  . A-fib ([place])   . Allergic rhinitis, cause unspecified   . Anxiety   . Arrhythmia    afib  . Chronic kidney disease, unspecified ([place])   . Edema   . Essential hypertension, benign   . Gout, unspecified   . Heart murmur    as child  . Hyperlipidemia   . Lipoma of unspecified site   . Obesity, unspecified   . Other and unspecified disc disorder of lumbar region   . Other general symptoms(780.99)   . Renal insufficiency   . Restless legs syndrome (RLS)   . Sebaceous cyst   . Type II or unspecified type diabetes mellitus without mention of complication, uncontrolled   . Unspecified asthma(493.90)   . Unspecified disease of pericardium   . Unspecified sleep apnea   . Unspecified vitamin D deficiency   . Vaginitis and vulvovaginitis, unspecified     Patient Active Problem List   Diagnosis Date Noted  . CHF (congestive heart failure) ([place]) 05/21/2016  . Anemia in chronic kidney disease 12/22/2015  . Pulmonary hypertension ([place]) 12/18/2015  . 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 Anxiety 04/12/2015  . Chronic kidney disease (CKD), stage III (moderate) 04/12/2015  . Edema extremities 04/12/2015  . 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 Asthma, moderate persistent 04/12/2015  . Morbid obesity ([place]) 04/12/2015    . Obstructive apnea 04/12/2015  . Restless leg 04/12/2015  . Allergic rhinitis 04/12/2015  . Vitamin D deficiency 04/12/2015  . Controlled gout 04/12/2015  . Premature atrial contractions 11/29/2013  . Essential hypertension, benign   . Hyperlipidemia     Past Surgical History:  Procedure Laterality Date  . CARDIAC CATHETERIZATION  2002   [facility]  . PERICARDIUM SURGERY      Prior to Admission medications   Medication Sig Start Date End Date Taking? Authorizing Provider  albuterol (PROVENTIL HFA;VENTOLIN HFA) 108 (90 BASE) MCG/ACT inhaler Inhale 2 puffs into the lungs every 6 (six) hours as needed for wheezing or shortness of breath.    Historical Provider, MD  allopurinol (ZYLOPRIM) 100 MG tablet Take 1 tablet (100 mg total) by mouth 2 (two) times daily. 04/20/16   Steele Sizer, MD  aspirin EC 325 MG tablet Take 1 tablet (325 mg total) by mouth daily. 12/18/15   Steele Sizer, MD  budesonide (PULMICORT) 0.25 MG/2ML nebulizer solution Take 2 mLs (0.25 mg total) by nebulization 2 (two) times daily. 04/20/16   Steele Sizer, MD  Cholecalciferol (VITAMIN D) 2000 UNITS tablet Take 2,000 Units by mouth daily.    Historical Provider, MD  COLCRYS 0.6 MG tablet TAKE ONE TABLET BY MOUTH TWICE DAILY AS NEEDED FOR GOUT 10/12/15   Steele Sizer, MD  diltiazem (CARDIZEM CD) 300 MG 24 hr capsule Take 1 capsule (300 mg total) by mouth daily. 04/20/16   Steele Sizer, MD  Elastic Bandages & Supports (MEDICAL COMPRESSION STOCKINGS) MISC 2 each by Does not apply route daily. 04/20/16   Steele Sizer, MD  fluticasone (FLONASE) 50 MCG/ACT nasal spray Place 2 sprays into both nostrils daily. 12/18/15   Steele Sizer, MD  furosemide (LASIX) 80 MG tablet Take 0.5 tablets (40 mg total) by mouth daily. 04/21/16   Steele Sizer, MD  hydrALAZINE (APRESOLINE) 10 MG tablet Take 1 tablet (10 mg total) by mouth 2 (two) times daily. 04/20/16   Steele Sizer, MD  Insulin Lispro Prot & Lispro (HUMALOG MIX 75/25 KWIKPEN)  (75-25) 100 UNIT/ML Kwikpen Inject 40-60 Units into the skin 2 (two) times daily. 04/20/16   Steele Sizer, MD  Insulin Pen Needle (NOVOFINE) 30G X 8 MM MISC Inject 10 each into the skin daily. 04/20/16   Steele Sizer, MD  irbesartan (AVAPRO) 300 MG tablet Take 1 tablet (300 mg total) by mouth daily. 04/20/16   Steele Sizer, MD  Potassium Chloride ER 20 MEQ TBCR Take 1 tablet by mouth daily. 04/20/16   Steele Sizer, MD  pravastatin (PRAVACHOL) 80 MG tablet Take 1 tablet (80 mg total) by mouth daily. 04/20/16   Steele Sizer, MD    Allergies Ace inhibitors  Family History  Problem Relation Age of Onset  . Heart attack Mother   . Hypertension Mother     Social History Social History  Substance Use Topics  . Smoking status: Never Smoker  . Smokeless tobacco: Never Used  . Alcohol use No    Review of Systems Constitutional: No fever/chills Eyes: No visual changes. ENT: No sore throat. Cardiovascular: Denies chest pain. Respiratory: Positive for shortness of breath. Gastrointestinal: No abdominal pain.  No nausea, no vomiting.  No diarrhea.  No constipation. Genitourinary: Negative for dysuria. Musculoskeletal: Negative for back pain. Positive for bilateral lower extremity edema Skin: Negative for rash. Neurological: Negative for headaches, focal weakness or numbness.  10-point ROS otherwise negative.  ____________________________________________   PHYSICAL EXAM:  VITAL SIGNS: ED Triage Vitals  Enc Vitals Group     BP 05/20/16 2333 (!) 158/74     Pulse Rate 05/20/16 2333 (!) 109     Resp 05/20/16 2333 20     Temp 05/20/16 2333 98.1 F (36.7 C)     Temp Source 05/20/16 2333 Oral     SpO2 05/20/16 2333 98 %     Weight 05/20/16 2332 (!) 399 lb (181 kg)     Height 05/20/16 2332 5\' 10"  (1.778 m)     Head Circumference --      Peak Flow --      Pain Score --      Pain Loc --      Pain Edu? --      Excl. in [place]? --     Constitutional: Alert and oriented. Well  appearing and in no acute distress. Eyes: Conjunctivae are normal. PERRL. EOMI. Head: Atraumatic. Mouth/Throat: Mucous membranes are moist.  Oropharynx non-erythematous. Neck: No stridor.  No meningeal signs.   Cardiovascular: Normal rate, regular rhythm. Good peripheral circulation. Grossly normal heart sounds. Respiratory: Normal respiratory effort.  No retractions. Bibasilar rhonchi with extremity wheezing. Gastrointestinal: Soft and nontender. No distention.  Musculoskeletal: 2+ bilateral lower extremity pitting edema No gross deformities of extremities. Neurologic:  Normal speech and language. No gross focal neurologic deficits are appreciated.  Skin:  Skin is warm, dry and intact. No rash noted. Psychiatric: Mood and affect are normal. Speech and behavior are normal.  ____________________________________________   LABS (all labs  ordered are listed, but only abnormal results are displayed)  Labs Reviewed  CBC - Abnormal; Notable for the following:       Result Value   WBC 11.3 (*)    Hemoglobin 11.1 (*)    HCT 33.5 (*)    RDW 16.1 (*)    All other components within normal limits  BASIC METABOLIC PANEL - Abnormal; Notable for the following:    Glucose, Bld 158 (*)    BUN 29 (*)    Creatinine, Ser 1.46 (*)    GFR calc non Af Amer 36 (*)    GFR calc Af Amer 41 (*)    All other components within normal limits  BRAIN NATRIURETIC PEPTIDE - Abnormal; Notable for the following:    B Natriuretic Peptide 198.0 (*)    All other components within normal limits  FIBRIN DERIVATIVES D-DIMER ([facility] ONLY) - Abnormal; Notable for the following:    Fibrin derivatives D-dimer ([facility]) 663 (*)    All other components within normal limits  TROPONIN I   ____________________________________________  EKG  ED ECG REPORT I, Berino N Kaliopi Blyden, the attending physician, personally viewed and interpreted this ECG.   Date: 05/21/2016  EKG Time: 11:40 PM  Rate: 108  Rhythm: Sinus tachycardia   Axis: Normal  Intervals: Normal  ST&T Change: None  ____________________________________________  RADIOLOGY I, Montgomery N Aime Carreras, personally viewed and evaluated these images (plain radiographs) as part of my medical decision making, as well as reviewing the written report by the radiologist.  Dg Chest 2 View  Result Date: 05/20/2016 CLINICAL DATA:  Shortness of breath for 3 weeks. EXAM: CHEST  2 VIEW COMPARISON:  01/06/2016 FINDINGS: Mild cardiac enlargement. No pleural effusion or edema identified. No airspace consolidation. The visualized osseous structures are unremarkable. IMPRESSION: 1. No acute cardiopulmonary abnormalities. Electronically Signed   By: Kerby Moors M.D.   On: 05/20/2016 23:59   Ct Angio Chest Pe W And/or Wo Contrast  Result Date: 05/21/2016 CLINICAL DATA:  70 year old female with shortness of breath. EXAM: CT ANGIOGRAPHY CHEST WITH CONTRAST TECHNIQUE: Multidetector CT imaging of the chest was performed using the standard protocol during bolus administration of intravenous contrast. Multiplanar CT image reconstructions and MIPs were obtained to evaluate the vascular anatomy. CONTRAST:  100 cc Isovue 370 COMPARISON:  Chest radiograph dated 05/20/2016 FINDINGS: A 3 mm subpleural nodule in the right upper lobe versus with a vessel on-end and (series 6 image 161). The lungs are otherwise clear. There is no pleural effusion or pneumothorax. The central airways are patent. There is moderate atherosclerotic calcification of the thoracic aorta. There is no aneurysmal dilatation or evidence of dissection. The origins of the great vessels of the aortic arch appear patent. Evaluation of the pulmonary arteries is limited due to suboptimal opacification and timing of the contrast. No definite central pulmonary artery embolus identified. V/Q scan may provide additional information if there is high clinical concern for pulmonary embolism. Mild cardiomegaly. No pericardial effusion. There is  coronary vascular calcification. There is no hilar or mediastinal adenopathy. The esophagus is grossly unremarkable. No thyroid nodules identified. There is no axillary adenopathy. The chest wall soft tissues appear unremarkable. There is degenerative changes of the spine. No acute fracture. Layering stone noted within the gallbladder. The visualized upper abdomen is otherwise unremarkable. IMPRESSION: No definite CT evidence of central pulmonary artery embolus on suboptimal evaluation of the pulmonary arteries. V/Q scan may provide better evaluation if there is high clinical concern for PE. No other acute  intrathoracic pathology identified. Cholelithiasis. Electronically Signed   By: Anner Crete M.D.   On: 05/21/2016 04:38     Procedures     INITIAL IMPRESSION / ASSESSMENT AND PLAN / ED COURSE  Pertinent labs & imaging results that were available during my care of the patient were reviewed by me and considered in my medical decision making (see chart for details).  History of physical exam concerning for possible CHF.    Clinical Course    ____________________________________________  FINAL CLINICAL IMPRESSION(S) / ED DIAGNOSES  Final diagnoses:  Acute congestive heart failure, unspecified congestive heart failure type ([place])     MEDICATIONS GIVEN DURING THIS VISIT:  Medications  insulin aspart (novoLOG) injection 0-20 Units (not administered)  insulin aspart (novoLOG) injection 0-5 Units (not administered)  ipratropium-albuterol (DUONEB) 0.5-2.5 (3) MG/3ML nebulizer solution 3 mL (3 mLs Nebulization Given 05/21/16 0342)  iopamidol (ISOVUE-370) 76 % injection 75 mL (75 mLs Intravenous Contrast Given 05/21/16 0413)  furosemide (LASIX) injection 60 mg (60 mg Intravenous Given 05/21/16 0625)     NEW OUTPATIENT MEDICATIONS STARTED DURING THIS VISIT:  New Prescriptions   No medications on file    Modified Medications   No medications on file    Discontinued Medications     No medications on file     Note:  This document was prepared using Dragon voice recognition software and may include unintentional dictation errors.    Gregor Hams, MD 05/21/16 [phone]

## 2016-05-21 NOTE — ED Notes (Signed)
MD Brown at bedside.

## 2016-05-21 NOTE — Progress Notes (Signed)
Initial Heart Failure Clinic appointment scheduled for June 09, 2016 at 1:30pm. Thank you.

## 2016-05-21 NOTE — ED Notes (Signed)
Pt c/o weakness/lightheadedness, fullness of legs/arms/abdomen/chest, SOB, and palpitations, X 2-3 days.

## 2016-05-21 NOTE — Progress Notes (Signed)
Pt became diaphoretic around 1445. Then began to feel like her blood sugar was low.  Checked at 1450 - 82. Crackers, peanut butter and juice given. Rechecked and wn;Marland Kitchen Pt feeling better.  Also, pt is c/o bialt leg cramps. Requesting bengay or icy hot.  Spoke with Dr Posey Pronto. She wants [place] to hold evening insulin. Also order for men phor lotion for pt comfort.

## 2016-05-21 NOTE — Plan of Care (Signed)
Problem: Food- and Nutrition-Related Knowledge Deficit (NB-1.1) Goal: Nutrition education Formal process to instruct or train a patient/client in a skill or to impart knowledge to help patients/clients voluntarily manage or modify food choices and eating behavior to maintain or improve health. Outcome: Completed/Met Date Met: 05/21/16 Nutrition Education Note  RD consulted for nutrition education regarding new onset CHF.  RD provided "Low Sodium Nutrition Therapy" handout from the Academy of [employer] Dietetics. Reviewed patient's dietary recall. Provided examples on ways to decrease sodium intake in diet. Discouraged intake of processed foods and use of salt shaker. Encouraged fresh fruits and vegetables as well as whole grain sources of carbohydrates to maximize fiber intake.   RD discussed why it is important for patient to adhere to diet recommendations, and emphasized the role of fluids, foods to avoid, and importance of weighing self daily. Teach back method used.  Expect fair compliance.  Body mass index is 57.04 kg/m. Pt meets criteria for obese class III based on current BMI.  Current diet order is heart healthy, patient is consuming approximately 100% of meals at this time. Labs and medications reviewed. No further nutrition interventions warranted at this time. RD contact information provided. If additional nutrition issues arise, please re-consult RD.   Amber Anis. Lydell Moga, MS, RD LDN Inpatient Clinical Dietitian Pager [phone]

## 2016-05-21 NOTE — ED Notes (Signed)
Patient transported to CT 

## 2016-05-21 NOTE — Progress Notes (Signed)
[facility] [place] NAME: Amber Reyes    MR#:  [id number]  DATE OF BIRTH:  01-05-1946  SUBJECTIVE:  Came in with increasing SOB and leg edema  REVIEW OF SYSTEMS:   Review of Systems  Constitutional: Negative for chills, fever and weight loss.  HENT: Negative for ear discharge, ear pain and nosebleeds.   Eyes: Negative for blurred vision, pain and discharge.  Respiratory: Positive for shortness of breath. Negative for sputum production, wheezing and stridor.   Cardiovascular: Positive for leg swelling. Negative for chest pain, palpitations, orthopnea and PND.  Gastrointestinal: Negative for abdominal pain, diarrhea, nausea and vomiting.  Genitourinary: Negative for frequency and urgency.  Musculoskeletal: Negative for back pain and joint pain.  Neurological: Positive for weakness. Negative for sensory change, speech change and focal weakness.  Psychiatric/Behavioral: Negative for depression and hallucinations. The patient is not nervous/anxious.    Tolerating Diet: Tolerating PT:   DRUG ALLERGIES:  No Active Allergies  VITALS:  Blood pressure (!) 155/85, pulse (!) 51, temperature 98.3 F (36.8 C), temperature source Oral, resp. rate 16, height 5\' 10"  (1.778 m), weight (!) 180.3 kg (397 lb 8 oz), SpO2 100 %.  PHYSICAL EXAMINATION:   Physical Exam  GENERAL:  70 y.o.-year-old patient lying in the bed with no acute distress. obese EYES: Pupils equal, round, reactive to light and accommodation. No scleral icterus. Extraocular muscles intact.  HEENT: Head atraumatic, normocephalic. Oropharynx and nasopharynx clear.  NECK:  Supple, no jugular venous distention. No thyroid enlargement, no tenderness.  LUNGS: distant breath sounds bilaterally, no wheezing, rales, rhonchi. No use of accessory muscles of respiration.  CARDIOVASCULAR: S1, S2 normal. No murmurs, rubs, or gallops.  ABDOMEN: Soft, nontender, nondistended. Bowel sounds  present. No organomegaly or mass.  EXTREMITIES: No cyanosis, clubbing  +++ edema b/l.    NEUROLOGIC: Cranial nerves II through XII are intact. No focal Motor or sensory deficits b/l.   PSYCHIATRIC:  patient is alert and oriented x 3.  SKIN: No obvious rash, lesion, or ulcer.   LABORATORY PANEL:  CBC  Recent Labs Lab 05/21/16 0844  WBC 9.6  HGB 11.3*  HCT 32.7*  PLT 223    Chemistries   Recent Labs Lab 05/20/16 2341 05/21/16 0844  NA 136  --   K 4.6  --   CL 102  --   CO2 23  --   GLUCOSE 158*  --   BUN 29*  --   CREATININE 1.46* 1.44*  CALCIUM 8.9  --    Cardiac Enzymes  Recent Labs Lab 05/21/16 0844  TROPONINI <0.03   RADIOLOGY:  Dg Chest 2 View  Result Date: 05/20/2016 CLINICAL DATA:  Shortness of breath for 3 weeks. EXAM: CHEST  2 VIEW COMPARISON:  01/06/2016 FINDINGS: Mild cardiac enlargement. No pleural effusion or edema identified. No airspace consolidation. The visualized osseous structures are unremarkable. IMPRESSION: 1. No acute cardiopulmonary abnormalities. Electronically Signed   By: Kerby Moors M.D.   On: 05/20/2016 23:59   Ct Angio Chest Pe W And/or Wo Contrast  Result Date: 05/21/2016 CLINICAL DATA:  70 year old female with shortness of breath. EXAM: CT ANGIOGRAPHY CHEST WITH CONTRAST TECHNIQUE: Multidetector CT imaging of the chest was performed using the standard protocol during bolus administration of intravenous contrast. Multiplanar CT image reconstructions and MIPs were obtained to evaluate the vascular anatomy. CONTRAST:  100 cc Isovue 370 COMPARISON:  Chest radiograph dated 05/20/2016 FINDINGS: A 3 mm subpleural nodule in the right  upper lobe versus with a vessel on-end and (series 6 image 161). The lungs are otherwise clear. There is no pleural effusion or pneumothorax. The central airways are patent. There is moderate atherosclerotic calcification of the thoracic aorta. There is no aneurysmal dilatation or evidence of dissection. The  origins of the great vessels of the aortic arch appear patent. Evaluation of the pulmonary arteries is limited due to suboptimal opacification and timing of the contrast. No definite central pulmonary artery embolus identified. V/Q scan may provide additional information if there is high clinical concern for pulmonary embolism. Mild cardiomegaly. No pericardial effusion. There is coronary vascular calcification. There is no hilar or mediastinal adenopathy. The esophagus is grossly unremarkable. No thyroid nodules identified. There is no axillary adenopathy. The chest wall soft tissues appear unremarkable. There is degenerative changes of the spine. No acute fracture. Layering stone noted within the gallbladder. The visualized upper abdomen is otherwise unremarkable. IMPRESSION: No definite CT evidence of central pulmonary artery embolus on suboptimal evaluation of the pulmonary arteries. V/Q scan may provide better evaluation if there is high clinical concern for PE. No other acute intrathoracic pathology identified. Cholelithiasis. Electronically Signed   By: Anner Crete M.D.   On: 05/21/2016 04:38   ASSESSMENT AND PLAN:   70 year old obese female patient with history of chronic kidney disease, atrial fibrillation, hypertension, hyperlipidemia, type 2 diabetes meters, gout presented to the emergency room with swelling in both the lower extremities and difficulty breathing.  1. New onset congestive heart failure -IV lasix bid -daily weight, I and O -cardiology consult with [facility](pt has seen them in the past) -overnite pulse oximetry  2. Lower extremity edema probably secondary to heart failure  3. Morbid oesity with OSA and non compliance to CPAP -d/w pt importance of using CPAP  4. Chronic kidney disease Baseline creat 1.4 Watch with IV lasix Avoid nephrotoxins  5. Hypertension -resume home meds  Case discussed with Care Management/Social Worker. Management plans discussed with the  patient, family and they are in agreement.  CODE STATUS: Full  DVT Prophylaxis: lovenox  TOTAL TIME TAKING CARE OF THIS PATIENT: 30 minutes.  >50% time spent on counselling and coordination of care  POSSIBLE D/C IN 1-2 DAYS, DEPENDING ON CLINICAL CONDITION.  Note: This dictation was prepared with Dragon dictation along with smaller phrase technology. Any transcriptional errors that result from this process are unintentional.  Lanita Stammen M.D on 05/21/2016 at 2:37 PM  Between 7am to 6pm - Pager - [phone]  After 6pm go to [email or web address] - password [email or web address] [facility]  Office  [phone]  CC: Primary care physician; Loistine Chance, MD

## 2016-05-21 NOTE — Consult Note (Signed)
Cardiology Consultation Note  Patient ID: JIMMIA SCHEDLER, MRN: [id number], DOB/AGE: 1946-05-02 70 y.o. Admit date: 05/21/2016   Date of Consult: 05/21/2016 Primary Physician: Loistine Chance, MD Primary Cardiologist: Dr. Fletcher Anon, MD (last saw in 2015) Requesting Physician: Dr. Posey Pronto, MD  Chief Complaint: LE swelling/SOB/wheezing Reason for Consult: Acute on chronic diastolic CHF  HPI: 70 y.o. female with h/o chronic diastolic CHF, pulmonary hypertension, super obesity, frequent PACs previously felt to be Afib though subsequently found to be sinus rhythm with PACs with small P waves, hypertensive heart disease, CKD stage III, OSA with possible OHS who presented to [facility] on 9/29 with a 2 week history of intermittent LE swelling, cough, and wheezing.  She was last seen by Dr. Fletcher Anon in 2015 for evaluation of frequent PACs that were previously felt to be Afib, however upon review she was found to have small P waves and no Afib. She reported a priro admission to [facility] in 2002 for pericardial effusion which required drainage. At that time she reported having a cardiac cath and self reported no significant CAD. Prior echo from 2015 showed normal EF with mildly elevated right-side pressure at 36 mmHg.   She has ongoing moderate persistent asthma that requires multiple inhalers and breathing treatments. She was in her USOH until 2 weeks ago when she began to notice and intermittent LE swelling that improves with elevation of her legs along with increased cough, SOB, and wheezing. She was having to use her inhalers more frequently with good results however her symptoms persisted. Never with chest pain. Stable 2-pillow orthopnea. No early satiety. Hs never worn compression stockings.   Upon the patient's arrival to [facility] they were found to have a mildly elevated BNP at 198, negative troponin x 3, elevated D dimer with a CTA chest showing no acute PE, CXR was negative for any vascular congestion, edema, or  effusion. ECG as below. She has received multiple breathing treatments this admission with improvement in her SOB. Currently without SOB. LE swelling stable.   Past Medical History:  Diagnosis Date  . Allergic rhinitis, cause unspecified   . Anxiety   . Chronic diastolic CHF (congestive heart failure) ([place])   . CKD (chronic kidney disease), stage III   . Edema   . Essential hypertension, benign   . Gout, unspecified   . Heart murmur    as child  . Hyperlipidemia   . Lipoma of unspecified site   . Other and unspecified disc disorder of lumbar region   . Other general symptoms(780.99)   . PAC (premature atrial contraction)   . Renal insufficiency   . Restless legs syndrome (RLS)   . Sebaceous cyst   . Super obesity ([place])   . Type II or unspecified type diabetes mellitus without mention of complication, uncontrolled   . Unspecified asthma(493.90)   . Unspecified disease of pericardium   . Unspecified sleep apnea   . Unspecified vitamin D deficiency   . Vaginitis and vulvovaginitis, unspecified   . Venous insufficiency       Most Recent Cardiac Studies: Echo 11/16/2013: Study Conclusions  - Left ventricle: The cavity size was normal. Systolic function was normal. The estimated ejection fraction was in the range of 60% to 65%. Wall motion was normal; there were no regional wall motion abnormalities. Left ventricular diastolic function parameters were normal. - Aortic valve: Trivial regurgitation. - Mitral valve: Mild regurgitation. - Left atrium: The atrium was mildly dilated. - Right ventricle: Systolic function was normal. -  Tricuspid valve: Transvalvular velocity was mildly elevated. - Pulmonary arteries: PA peak pressure: 52mm Hg (S). - Pericardium, extracardiac: A trivial pericardial effusion was identified. Echo 05/21/2016: Study Conclusions  - Left ventricle: The cavity size was normal. Systolic function was   normal. The estimated ejection fraction  was 55%. Wall motion was   normal; there were no regional wall motion abnormalities. Doppler   parameters are consistent with abnormal left ventricular   relaxation (grade 1 diastolic dysfunction). - Aortic valve: There was trivial regurgitation. Valve area (Vmax):   1.99 cm^2. - Left atrium: The atrium was mildly dilated. - Atrial septum: No defect or patent foramen ovale was identified.   Surgical History:  Past Surgical History:  Procedure Laterality Date  . CARDIAC CATHETERIZATION  2002   [facility]  . PERICARDIUM SURGERY       Home Meds: Prior to Admission medications   Medication Sig Start Date End Date Taking? Authorizing Provider  albuterol (PROVENTIL HFA;VENTOLIN HFA) 108 (90 BASE) MCG/ACT inhaler Inhale 2 puffs into the lungs every 6 (six) hours as needed for wheezing or shortness of breath.   Yes Historical Provider, MD  allopurinol (ZYLOPRIM) 100 MG tablet Take 1 tablet (100 mg total) by mouth 2 (two) times daily. 04/20/16  Yes Steele Sizer, MD  aspirin EC 81 MG tablet Take 81 mg by mouth daily.   Yes Historical Provider, MD  budesonide (PULMICORT) 0.25 MG/2ML nebulizer solution Take 2 mLs (0.25 mg total) by nebulization 2 (two) times daily. 04/20/16  Yes Steele Sizer, MD  Cholecalciferol (VITAMIN D) 2000 UNITS tablet Take 2,000 Units by mouth daily.   Yes Historical Provider, MD  COLCRYS 0.6 MG tablet TAKE ONE TABLET BY MOUTH TWICE DAILY AS NEEDED FOR GOUT 10/12/15  Yes Steele Sizer, MD  diltiazem (CARDIZEM CD) 300 MG 24 hr capsule Take 1 capsule (300 mg total) by mouth daily. 04/20/16  Yes Steele Sizer, MD  fluticasone (FLONASE) 50 MCG/ACT nasal spray Place 2 sprays into both nostrils daily. 12/18/15  Yes Steele Sizer, MD  furosemide (LASIX) 80 MG tablet Take 0.5 tablets (40 mg total) by mouth daily. 04/21/16  Yes Steele Sizer, MD  hydrALAZINE (APRESOLINE) 10 MG tablet Take 1 tablet (10 mg total) by mouth 2 (two) times daily. 04/20/16  Yes Steele Sizer, MD  Insulin Lispro  Prot & Lispro (HUMALOG MIX 75/25 KWIKPEN) (75-25) 100 UNIT/ML Kwikpen Inject 40-60 Units into the skin 2 (two) times daily. Patient taking differently: Inject 40-60 Units into the skin 2 (two) times daily. 60 units am, 40 units pm 04/20/16  Yes Steele Sizer, MD  irbesartan (AVAPRO) 300 MG tablet Take 1 tablet (300 mg total) by mouth daily. 04/20/16  Yes Steele Sizer, MD  pravastatin (PRAVACHOL) 80 MG tablet Take 1 tablet (80 mg total) by mouth daily. Patient taking differently: Take 80 mg by mouth every evening.  04/20/16  Yes Steele Sizer, MD  Elastic Bandages & Supports ([place]) [place] 2 each by Does not apply route daily. 04/20/16   Steele Sizer, MD  Insulin Pen Needle (NOVOFINE) 30G X 8 MM MISC Inject 10 each into the skin daily. 04/20/16   Steele Sizer, MD    Inpatient Medications:  . allopurinol  100 mg Oral BID  . aspirin EC  81 mg Oral Daily  . budesonide  0.25 mg Nebulization BID  . cholecalciferol  2,000 Units Oral Daily  . colchicine  0.6 mg Oral Daily  . diltiazem  300 mg Oral Daily  . enoxaparin (LOVENOX)  injection  40 mg Subcutaneous Q12H  . fluticasone  2 spray Each Nare Daily  . furosemide  60 mg Intravenous BID  . hydrALAZINE  10 mg Oral BID  . [START ON 05/22/2016] Influenza vac split quadrivalent PF  0.5 mL Intramuscular Tomorrow-1000  . insulin aspart  0-20 Units Subcutaneous TID WC  . insulin aspart  0-5 Units Subcutaneous QHS  . insulin aspart protamine- aspart  40 Units Subcutaneous Q supper  . insulin aspart protamine- aspart  60 Units Subcutaneous Q breakfast  . irbesartan  300 mg Oral Daily  . perflutren lipid microspheres (DEFINITY) IV suspension      . pravastatin  80 mg Oral Daily  . sodium chloride flush  3 mL Intravenous Q12H      Allergies: No Active Allergies  Social History   Social History  . Marital status: Married    Spouse name: N/A  . Number of children: N/A  . Years of education: N/A   Occupational History  .  retired    Social History Main Topics  . Smoking status: Never Smoker  . Smokeless tobacco: Never Used  . Alcohol use No  . Drug use: No  . Sexual activity: Not Currently   Other Topics Concern  . Not on file   Social History Narrative  . No narrative on file     Family History  Problem Relation Age of Onset  . Heart attack Mother   . Hypertension Mother      Review of Systems: Review of Systems  Constitutional: Positive for malaise/fatigue. Negative for chills, diaphoresis, fever and weight loss.  HENT: Negative for congestion.   Eyes: Negative for discharge and redness.  Respiratory: Positive for cough, shortness of breath and wheezing. Negative for hemoptysis and sputum production.   Cardiovascular: Positive for orthopnea and leg swelling. Negative for chest pain, palpitations, claudication and PND.       Stable 2-pillow orthopnea   Gastrointestinal: Negative for abdominal pain, heartburn, nausea and vomiting.  Musculoskeletal: Negative for falls and myalgias.  Skin: Negative for rash.  Neurological: Positive for weakness. Negative for dizziness, tingling, tremors, sensory change, speech change, focal weakness and loss of consciousness.  Endo/Heme/Allergies: Does not bruise/bleed easily.  Psychiatric/Behavioral: Negative for substance abuse. The patient is not nervous/anxious.   All other systems reviewed and are negative.   Labs:  Recent Labs  05/20/16 2341 05/21/16 0844 05/21/16 1426  TROPONINI <0.03 <0.03 <0.03   Lab Results  Component Value Date   WBC 9.6 05/21/2016   HGB 11.3 (L) 05/21/2016   HCT 32.7 (L) 05/21/2016   MCV 84.7 05/21/2016   PLT 223 05/21/2016     Recent Labs Lab 05/20/16 2341 05/21/16 0844  NA 136  --   K 4.6  --   CL 102  --   CO2 23  --   BUN 29*  --   CREATININE 1.46* 1.44*  CALCIUM 8.9  --   GLUCOSE 158*  --    Lab Results  Component Value Date   CHOL 143 04/20/2016   HDL 52 04/20/2016   LDLCALC 74 04/20/2016    TRIG 86 04/20/2016   No results found for: DDIMER  Radiology/Studies:  Dg Chest 2 View  Result Date: 05/20/2016 CLINICAL DATA:  Shortness of breath for 3 weeks. EXAM: CHEST  2 VIEW COMPARISON:  01/06/2016 FINDINGS: Mild cardiac enlargement. No pleural effusion or edema identified. No airspace consolidation. The visualized osseous structures are unremarkable. IMPRESSION: 1. No acute cardiopulmonary abnormalities. Electronically  Signed   By: Kerby Moors M.D.   On: 05/20/2016 23:59   Ct Angio Chest Pe W And/or Wo Contrast  Result Date: 05/21/2016 CLINICAL DATA:  70 year old female with shortness of breath. EXAM: CT ANGIOGRAPHY CHEST WITH CONTRAST TECHNIQUE: Multidetector CT imaging of the chest was performed using the standard protocol during bolus administration of intravenous contrast. Multiplanar CT image reconstructions and MIPs were obtained to evaluate the vascular anatomy. CONTRAST:  100 cc Isovue 370 COMPARISON:  Chest radiograph dated 05/20/2016 FINDINGS: A 3 mm subpleural nodule in the right upper lobe versus with a vessel on-end and (series 6 image 161). The lungs are otherwise clear. There is no pleural effusion or pneumothorax. The central airways are patent. There is moderate atherosclerotic calcification of the thoracic aorta. There is no aneurysmal dilatation or evidence of dissection. The origins of the great vessels of the aortic arch appear patent. Evaluation of the pulmonary arteries is limited due to suboptimal opacification and timing of the contrast. No definite central pulmonary artery embolus identified. V/Q scan may provide additional information if there is high clinical concern for pulmonary embolism. Mild cardiomegaly. No pericardial effusion. There is coronary vascular calcification. There is no hilar or mediastinal adenopathy. The esophagus is grossly unremarkable. No thyroid nodules identified. There is no axillary adenopathy. The chest wall soft tissues appear  unremarkable. There is degenerative changes of the spine. No acute fracture. Layering stone noted within the gallbladder. The visualized upper abdomen is otherwise unremarkable. IMPRESSION: No definite CT evidence of central pulmonary artery embolus on suboptimal evaluation of the pulmonary arteries. V/Q scan may provide better evaluation if there is high clinical concern for PE. No other acute intrathoracic pathology identified. Cholelithiasis. Electronically Signed   By: Anner Crete M.D.   On: 05/21/2016 04:38    EKG: Interpreted by me showed: sinus tachycardia, 108 bpm, nonspecific st/t changes Telemetry: Interpreted by me showed: sinus rhythm with frequent PACs with small P waves vs Afib  Weights: Filed Weights   05/20/16 2332 05/21/16 0825  Weight: (!) 399 lb (181 kg) (!) 397 lb 8 oz (180.3 kg)     Physical Exam: Blood pressure (!) 155/85, pulse (!) 51, temperature 98.3 F (36.8 C), temperature source Oral, resp. rate 16, height 5\' 10"  (1.778 m), weight (!) 397 lb 8 oz (180.3 kg), SpO2 100 %. Body mass index is 57.04 kg/m. General: Well developed, well nourished, in no acute distress. Head: Normocephalic, atraumatic, sclera non-icteric, no xanthomas, nares are without discharge.  Neck: Negative for carotid bruits. JVD not elevated. Lungs: Clear bilaterally to auscultation without wheezes, rales, or rhonchi. Breathing is unlabored. Heart: Irregular with S1 S2. II/VI SEM, nno rubs, or gallops appreciated. Abdomen: Soft, non-tender, non-distended with normoactive bowel sounds. No hepatomegaly. No rebound/guarding. No obvious abdominal masses. Msk:  Strength and tone appear normal for age. Extremities: No clubbing or cyanosis. 1+ pitting edema to the mid shin. Distal pedal pulses are 2+ and equal bilaterally. Neuro: Alert and oriented X 3. No facial asymmetry. No focal deficit. Moves all extremities spontaneously. Psych:  Responds to questions appropriately with a normal affect.     Assessment and Plan:  Principal Problem:   Acute respiratory distress (HCC) Active Problems:   Venous insufficiency   Hypertensive heart disease   Acute on chronic diastolic (congestive) heart failure (HCC)   Premature atrial contractions   Asthma, moderate persistent   Super obesity (HCC)   Obstructive apnea    1. Acute respiratory distress with hypoxia: -Likely multifactorial including  asthma exacerbation with significant wheezing upon admission that improves with breathing treatments, acute on chronic diastolic CHF, super obesity with profound deconditioning, and possible OHS -Wean oxygen as able -CTA negative for acute PE, consider VQ scan for chronic PE  2. Acute on chronic diastolic CHF: -Agree with gentle diuresis with IV Lasix -Add beta blocker if tolerated from an asthma standpoint -CHF education  3. Venous insufficiency with lymphedema: -Compression stockings recommended -Elevate legs when sitting -Super obesity playing a role -Consider LE doppler to evaluate for DVT  4. Arrhythmia: -Known frequent PACs with small P waves in the past, previously felt to be Afib; however there has never been documented Afib in her medical history -On telemetry there is concern for Afib (presented in sinus tachycardia) vs sinus rhythm with frequent PACs -Check repeat EKG to evaluate for possible small P waves  -Hold anticoagulation at this time until 12-lead EKG is evaluated  -Could benefit from beta blocker as above if tolerated from an asthma standpoint  5. Hypertensive heart disease: -Titrate antihypertensives to goal BP  6. Super obesity/OSA/possible OHS: -May need sleep study/CPAP -Weight loss advised -Would benefit from pulmonary rehab  7. Moderate persistent asthma: -Symptoms improved with breathing treatments, continue -May need steriods    Signed, Marcille Blanco [place] Pager: [phone] 05/21/2016, 3:58 PM

## 2016-05-22 LAB — BASIC METABOLIC PANEL
Anion gap: 5 (ref 5–15)
BUN: 39 mg/dL — AB (ref 6–20)
CALCIUM: 8.7 mg/dL — AB (ref 8.9–10.3)
CO2: 32 mmol/L (ref 22–32)
Chloride: 99 mmol/L — ABNORMAL LOW (ref 101–111)
Creatinine, Ser: 1.98 mg/dL — ABNORMAL HIGH (ref 0.44–1.00)
GFR calc Af Amer: 29 mL/min — ABNORMAL LOW (ref >60)
GFR, EST NON AFRICAN AMERICAN: 25 mL/min — AB (ref >60)
GLUCOSE: 127 mg/dL — AB (ref 65–99)
Potassium: 4.4 mmol/L (ref 3.5–5.1)
Sodium: 136 mmol/L (ref 135–145)

## 2016-05-22 LAB — GLUCOSE, CAPILLARY
Glucose-Capillary: 130 mg/dL — ABNORMAL HIGH (ref 65–99)
Glucose-Capillary: 128 mg/dL — ABNORMAL HIGH (ref 65–99)

## 2016-05-22 MED ORDER — FUROSEMIDE 40 MG PO TABS
40.0000 mg | ORAL_TABLET | Freq: Two times a day (BID) | ORAL | Status: DC
  Administered 2016-05-22: 40 mg via ORAL
  Filled 2016-05-22: qty 1

## 2016-05-22 MED ORDER — FUROSEMIDE 80 MG PO TABS: 40.0000 mg | ORAL_TABLET | Freq: Two times a day (BID) | ORAL | 0 refills | Status: DC

## 2016-05-22 NOTE — Care Management Note (Signed)
Case Management Note  Patient Details  Name: Amber Reyes MRN: [id number] Date of Birth: [birth year]  Subjective/Objective:     New home oxygen requested from [facility] for a stationary unit to be delived to patient's home for nightly use.                Action/Plan:   Expected Discharge Date:                  Expected Discharge Plan:     In-House Referral:     Discharge planning Services     Post Acute Care Choice:    Choice offered to:     DME Arranged:    DME Agency:     HH Arranged:    HH Agency:     Status of Service:     If discussed at [employer] of Stay Meetings, dates discussed:    Additional Comments:  Nole Robey A, RN 05/22/2016, 12:35 PM

## 2016-05-22 NOTE — Progress Notes (Signed)
Discharge instructions and prescriptions given with verbalized understanding.  VSS.  Patient taken out via wheelchair to be taken home in personal vehicle.

## 2016-05-22 NOTE — Discharge Summary (Signed)
[facility] [place] NAME: Amber Reyes    MR#:  [id number]  DATE OF BIRTH:  Aug 13, 1946  DATE OF ADMISSION:  05/21/2016 ADMITTING PHYSICIAN: Saundra Shelling, MD  DATE OF DISCHARGE: 05/22/16  PRIMARY CARE PHYSICIAN: Loistine Chance, MD    ADMISSION DIAGNOSIS:  Acute congestive heart failure, unspecified congestive heart failure type ([place]) [I50.9]  DISCHARGE DIAGNOSIS:  Acute Diastolic CHF, new HTN Dm-2 Untreated OSA (CPAP non compliance)  SECONDARY DIAGNOSIS:   Past Medical History:  Diagnosis Date  . Allergic rhinitis, cause unspecified   . Anxiety   . Chronic diastolic CHF (congestive heart failure) ([place])   . CKD (chronic kidney disease), stage III   . Edema   . Essential hypertension, benign   . Gout, unspecified   . Heart murmur    as child  . Hyperlipidemia   . Lipoma of unspecified site   . Other and unspecified disc disorder of lumbar region   . Other general symptoms(780.99)   . PAC (premature atrial contraction)   . Renal insufficiency   . Restless legs syndrome (RLS)   . Sebaceous cyst   . Super obesity ([place])   . Type II or unspecified type diabetes mellitus without mention of complication, uncontrolled   . Unspecified asthma(493.90)   . Unspecified disease of pericardium   . Unspecified sleep apnea   . Unspecified vitamin D deficiency   . Vaginitis and vulvovaginitis, unspecified   . Venous insufficiency     HOSPITAL COURSE:   70 year old obese female patient with history of chronic kidney disease, atrial fibrillation, hypertension, hyperlipidemia, type 2 diabetes meters, gout presented to the emergency room with swelling in both the lower extremities and difficulty breathing.  1.New onset congestive heart failure -IV lasix bid---lasix 40 mg bid for now till adjustments made by cardiology as out pt -daily weight, I and O -UOP 3000 cc -weight down from 399---393lbs (-) 6 lbs -cardiology  consult with [facility](pt has seen them in the past) -overnite pulse oximetry shows desaturation >88% for 9 mins. Will set up for home oxygen at night  2.Lower extremity edema probably secondary to heart failure -improved  3.Morbid oesity with OSA and non compliance to CPAP -d/w pt importance of using CPAP  4.Chronic kidney disease Baseline creat 1.4--1.98 Watch with IV lasix Avoid nephrotoxins  5.Hypertension -resume home meds  D/w cardiology and ok to go home Out pt f/u with CHF clinic and dr Rockey Situ CONSULTS OBTAINED:  Treatment Team:  Minna Merritts, MD Will Meredith Leeds, MD  DRUG ALLERGIES:  No Active Allergies  DISCHARGE MEDICATIONS:   Current Discharge Medication List    CONTINUE these medications which have CHANGED   Details  furosemide (LASIX) 80 MG tablet Take 0.5 tablets (40 mg total) by mouth 2 (two) times daily. Qty: 90 tablet, Refills: 0   Associated Diagnoses: Essential hypertension, benign      CONTINUE these medications which have NOT CHANGED   Details  albuterol (PROVENTIL HFA;VENTOLIN HFA) 108 (90 BASE) MCG/ACT inhaler Inhale 2 puffs into the lungs every 6 (six) hours as needed for wheezing or shortness of breath.    allopurinol (ZYLOPRIM) 100 MG tablet Take 1 tablet (100 mg total) by mouth 2 (two) times daily. Qty: 180 tablet, Refills: 1   Associated Diagnoses: Controlled gout    aspirin EC 81 MG tablet Take 81 mg by mouth daily.    budesonide (PULMICORT) 0.25 MG/2ML nebulizer solution Take 2 mLs (0.25 mg total)  by nebulization 2 (two) times daily. Qty: 120 mL, Refills: 2   Associated Diagnoses: Asthma, moderate persistent, uncomplicated    Cholecalciferol (VITAMIN D) 2000 UNITS tablet Take 2,000 Units by mouth daily.    COLCRYS 0.6 MG tablet TAKE ONE TABLET BY MOUTH TWICE DAILY AS NEEDED FOR GOUT Qty: 30 tablet, Refills: 5    diltiazem (CARDIZEM CD) 300 MG 24 hr capsule Take 1 capsule (300 mg total) by mouth daily. Qty: 90 capsule,  Refills: 1   Associated Diagnoses: Essential hypertension, benign    fluticasone (FLONASE) 50 MCG/ACT nasal spray Place 2 sprays into both nostrils daily. Qty: 48 g, Refills: 1    hydrALAZINE (APRESOLINE) 10 MG tablet Take 1 tablet (10 mg total) by mouth 2 (two) times daily. Qty: 90 tablet, Refills: 1   Associated Diagnoses: Essential hypertension, benign    Insulin Lispro Prot & Lispro (HUMALOG MIX 75/25 KWIKPEN) (75-25) 100 UNIT/ML Kwikpen Inject 40-60 Units into the skin 2 (two) times daily. Qty: 15 mL, Refills: 2   Associated Diagnoses: Uncontrolled type 2 diabetes mellitus with stage 3 chronic kidney disease, with long-term current use of insulin (HCC)    irbesartan (AVAPRO) 300 MG tablet Take 1 tablet (300 mg total) by mouth daily. Qty: 90 tablet, Refills: 1   Associated Diagnoses: Essential hypertension, benign    pravastatin (PRAVACHOL) 80 MG tablet Take 1 tablet (80 mg total) by mouth daily. Qty: 90 tablet, Refills: 1   Associated Diagnoses: Hyperlipidemia    Elastic Bandages & Supports (MEDICAL COMPRESSION STOCKINGS) MISC 2 each by Does not apply route daily. Qty: 2 each, Refills: 2   Associated Diagnoses: Bilateral edema of lower extremity    Insulin Pen Needle (NOVOFINE) 30G X 8 MM MISC Inject 10 each into the skin daily. Qty: 100 each, Refills: 1        If you experience worsening of your admission symptoms, develop shortness of breath, life threatening emergency, suicidal or homicidal thoughts you must seek medical attention immediately by calling 911 or calling your MD immediately  if symptoms less severe.  You Must read complete instructions/literature along with all the possible adverse reactions/side effects for all the Medicines you take and that have been prescribed to you. Take any new Medicines after you have completely understood and accept all the possible adverse reactions/side effects.   Please note  You were cared for by a hospitalist during your  hospital stay. If you have any questions about your discharge medications or the care you received while you were in the hospital after you are discharged, you can call the unit and asked to speak with the hospitalist on call if the hospitalist that took care of you is not available. Once you are discharged, your primary care physician will handle any further medical issues. Please note that NO REFILLS for any discharge medications will be authorized once you are discharged, as it is imperative that you return to your primary care physician (or establish a relationship with a primary care physician if you do not have one) for your aftercare needs so that they can reassess your need for medications and monitor your lab values.  DATA REVIEW:   CBC   Recent Labs Lab 05/21/16 0844  WBC 9.6  HGB 11.3*  HCT 32.7*  PLT 223    Chemistries   Recent Labs Lab 05/22/16 0428  NA 136  K 4.4  CL 99*  CO2 32  GLUCOSE 127*  BUN 39*  CREATININE 1.98*  CALCIUM 8.7*  Microbiology Results   No results found for this or any previous visit (from the past 240 hour(s)).  RADIOLOGY:  Dg Chest 2 View  Result Date: 05/20/2016 CLINICAL DATA:  Shortness of breath for 3 weeks. EXAM: CHEST  2 VIEW COMPARISON:  01/06/2016 FINDINGS: Mild cardiac enlargement. No pleural effusion or edema identified. No airspace consolidation. The visualized osseous structures are unremarkable. IMPRESSION: 1. No acute cardiopulmonary abnormalities. Electronically Signed   By: Kerby Moors M.D.   On: 05/20/2016 23:59   Ct Angio Chest Pe W And/or Wo Contrast  Result Date: 05/21/2016 CLINICAL DATA:  70 year old female with shortness of breath. EXAM: CT ANGIOGRAPHY CHEST WITH CONTRAST TECHNIQUE: Multidetector CT imaging of the chest was performed using the standard protocol during bolus administration of intravenous contrast. Multiplanar CT image reconstructions and MIPs were obtained to evaluate the vascular anatomy. CONTRAST:   100 cc Isovue 370 COMPARISON:  Chest radiograph dated 05/20/2016 FINDINGS: A 3 mm subpleural nodule in the right upper lobe versus with a vessel on-end and (series 6 image 161). The lungs are otherwise clear. There is no pleural effusion or pneumothorax. The central airways are patent. There is moderate atherosclerotic calcification of the thoracic aorta. There is no aneurysmal dilatation or evidence of dissection. The origins of the great vessels of the aortic arch appear patent. Evaluation of the pulmonary arteries is limited due to suboptimal opacification and timing of the contrast. No definite central pulmonary artery embolus identified. V/Q scan may provide additional information if there is high clinical concern for pulmonary embolism. Mild cardiomegaly. No pericardial effusion. There is coronary vascular calcification. There is no hilar or mediastinal adenopathy. The esophagus is grossly unremarkable. No thyroid nodules identified. There is no axillary adenopathy. The chest wall soft tissues appear unremarkable. There is degenerative changes of the spine. No acute fracture. Layering stone noted within the gallbladder. The visualized upper abdomen is otherwise unremarkable. IMPRESSION: No definite CT evidence of central pulmonary artery embolus on suboptimal evaluation of the pulmonary arteries. V/Q scan may provide better evaluation if there is high clinical concern for PE. No other acute intrathoracic pathology identified. Cholelithiasis. Electronically Signed   By: Anner Crete M.D.   On: 05/21/2016 04:38     Management plans discussed with the patient, family and they are in agreement.  CODE STATUS:     Code Status Orders        Start     Ordered   05/21/16 0836  Full code  Continuous     05/21/16 0835    Code Status History    Date Active Date Inactive Code Status Order ID Comments User Context   05/21/2016  8:35 AM 05/21/2016  8:39 AM Full Code [id number]  Saundra Shelling, MD  Inpatient      TOTAL TIME TAKING CARE OF THIS PATIENT: 40 minutes.    Adie Vilar M.D on 05/22/2016 at 12:14 PM  Between 7am to 6pm - Pager - [phone] After 6pm go to [email or web address] - password [email or web address] [facility]  Office  [phone]  CC: Primary care physician; Loistine Chance, MD

## 2016-05-22 NOTE — Progress Notes (Signed)
[facility] [place] NAME: Amber Reyes    MR#:  [id number]  DATE OF BIRTH:  1945/10/08  SUBJECTIVE:  Came in with increasing SOB and leg edema Feels a whole lot better Leg cramping resolved   REVIEW OF SYSTEMS:   Review of Systems  Constitutional: Negative for chills, fever and weight loss.  HENT: Negative for ear discharge, ear pain and nosebleeds.   Eyes: Negative for blurred vision, pain and discharge.  Respiratory: Negative for sputum production, wheezing and stridor.   Cardiovascular: Positive for leg swelling. Negative for chest pain, palpitations, orthopnea and PND.  Gastrointestinal: Negative for abdominal pain, diarrhea, nausea and vomiting.  Genitourinary: Negative for frequency and urgency.  Musculoskeletal: Negative for back pain and joint pain.  Neurological: Positive for weakness. Negative for sensory change, speech change and focal weakness.  Psychiatric/Behavioral: Negative for depression and hallucinations. The patient is not nervous/anxious.    Tolerating Diet:yes Tolerating PT: ambulatory  DRUG ALLERGIES:  No Active Allergies  VITALS:  Blood pressure (!) 126/51, pulse 69, temperature 97.9 F (36.6 C), temperature source Oral, resp. rate 18, height 5\' 10"  (1.778 m), weight (!) 178.4 kg (393 lb 6 oz), SpO2 97 %.  PHYSICAL EXAMINATION:   Physical Exam  GENERAL:  70 y.o.-year-old patient lying in the bed with no acute distress. obese EYES: Pupils equal, round, reactive to light and accommodation. No scleral icterus. Extraocular muscles intact.  HEENT: Head atraumatic, normocephalic. Oropharynx and nasopharynx clear.  NECK:  Supple, no jugular venous distention. No thyroid enlargement, no tenderness.  LUNGS: distant breath sounds bilaterally, no wheezing, rales, rhonchi. No use of accessory muscles of respiration.  CARDIOVASCULAR: S1, S2 normal. No murmurs, rubs, or gallops.  ABDOMEN: Soft, nontender,  nondistended. Bowel sounds present. No organomegaly or mass.  EXTREMITIES: No cyanosis, clubbing  + edema b/l.   TEDS++ NEUROLOGIC: Cranial nerves II through XII are intact. No focal Motor or sensory deficits b/l.   PSYCHIATRIC:  patient is alert and oriented x 3.  SKIN: No obvious rash, lesion, or ulcer.   LABORATORY PANEL:  CBC  Recent Labs Lab 05/21/16 0844  WBC 9.6  HGB 11.3*  HCT 32.7*  PLT 223    Chemistries   Recent Labs Lab 05/22/16 0428  NA 136  K 4.4  CL 99*  CO2 32  GLUCOSE 127*  BUN 39*  CREATININE 1.98*  CALCIUM 8.7*   Cardiac Enzymes  Recent Labs Lab 05/21/16 2029  TROPONINI <0.03   RADIOLOGY:  Dg Chest 2 View  Result Date: 05/20/2016 CLINICAL DATA:  Shortness of breath for 3 weeks. EXAM: CHEST  2 VIEW COMPARISON:  01/06/2016 FINDINGS: Mild cardiac enlargement. No pleural effusion or edema identified. No airspace consolidation. The visualized osseous structures are unremarkable. IMPRESSION: 1. No acute cardiopulmonary abnormalities. Electronically Signed   By: Kerby Moors M.D.   On: 05/20/2016 23:59   Ct Angio Chest Pe W And/or Wo Contrast  Result Date: 05/21/2016 CLINICAL DATA:  70 year old female with shortness of breath. EXAM: CT ANGIOGRAPHY CHEST WITH CONTRAST TECHNIQUE: Multidetector CT imaging of the chest was performed using the standard protocol during bolus administration of intravenous contrast. Multiplanar CT image reconstructions and MIPs were obtained to evaluate the vascular anatomy. CONTRAST:  100 cc Isovue 370 COMPARISON:  Chest radiograph dated 05/20/2016 FINDINGS: A 3 mm subpleural nodule in the right upper lobe versus with a vessel on-end and (series 6 image 161). The lungs are otherwise clear. There is no pleural  effusion or pneumothorax. The central airways are patent. There is moderate atherosclerotic calcification of the thoracic aorta. There is no aneurysmal dilatation or evidence of dissection. The origins of the great vessels  of the aortic arch appear patent. Evaluation of the pulmonary arteries is limited due to suboptimal opacification and timing of the contrast. No definite central pulmonary artery embolus identified. V/Q scan may provide additional information if there is high clinical concern for pulmonary embolism. Mild cardiomegaly. No pericardial effusion. There is coronary vascular calcification. There is no hilar or mediastinal adenopathy. The esophagus is grossly unremarkable. No thyroid nodules identified. There is no axillary adenopathy. The chest wall soft tissues appear unremarkable. There is degenerative changes of the spine. No acute fracture. Layering stone noted within the gallbladder. The visualized upper abdomen is otherwise unremarkable. IMPRESSION: No definite CT evidence of central pulmonary artery embolus on suboptimal evaluation of the pulmonary arteries. V/Q scan may provide better evaluation if there is high clinical concern for PE. No other acute intrathoracic pathology identified. Cholelithiasis. Electronically Signed   By: Anner Crete M.D.   On: 05/21/2016 04:38   ASSESSMENT AND PLAN:   70 year old obese female patient with history of chronic kidney disease, atrial fibrillation, hypertension, hyperlipidemia, type 2 diabetes meters, gout presented to the emergency room with swelling in both the lower extremities and difficulty breathing.  1. New onset congestive heart failure -IV lasix bid---lasix 40 mg bid -daily weight, I and O -UOP 3000 cc -weight down from 399---393lbs (-) 6 lbs -cardiology consult with [facility](pt has seen them in the past) -overnite pulse oximetry shows desaturation >88% for 9 mins. Will set up for home oxygen at night  2. Lower extremity edema probably secondary to heart failure -improved  3. Morbid oesity with OSA and non compliance to CPAP -d/w pt importance of using CPAP  4. Chronic kidney disease Baseline creat 1.4--1.98 Watch with IV lasix Avoid  nephrotoxins  5. Hypertension -resume home meds  Will await cardiology to come see her. D/c home today if ok with cards  Case discussed with Care Management/Social Worker. Management plans discussed with the patient, family and they are in agreement.  CODE STATUS: Full  DVT Prophylaxis: lovenox  TOTAL TIME TAKING CARE OF THIS PATIENT: 30 minutes.  >50% time spent on counselling and coordination of care with pt  POSSIBLE D/C IN 1-2 DAYS, DEPENDING ON CLINICAL CONDITION.  Note: This dictation was prepared with Dragon dictation along with smaller phrase technology. Any transcriptional errors that result from this process are unintentional.  Debroah Shuttleworth M.D on 05/22/2016 at 7:37 AM  Between 7am to 6pm - Pager - [phone]  After 6pm go to [email or web address] - password [email or web address] [facility]  Office  [phone]  CC: Primary care physician; Loistine Chance, MD

## 2016-05-22 NOTE — Discharge Instructions (Signed)
Heart Failure Clinic appointment on June 09, 2016 at 1:30pm with Darylene Price, [place]. Please call [phone] to reschedule.   USE oxygen 2liters/min at night time

## 2016-06-01 DIAGNOSIS — M109 Gout, unspecified: Secondary | ICD-10-CM | POA: Diagnosis not present

## 2016-06-01 DIAGNOSIS — Z794 Long term (current) use of insulin: Secondary | ICD-10-CM | POA: Diagnosis not present

## 2016-06-01 DIAGNOSIS — D631 Anemia in chronic kidney disease: Secondary | ICD-10-CM | POA: Diagnosis not present

## 2016-06-01 DIAGNOSIS — E213 Hyperparathyroidism, unspecified: Secondary | ICD-10-CM | POA: Diagnosis not present

## 2016-06-01 DIAGNOSIS — Z6841 Body Mass Index (BMI) 40.0 and over, adult: Secondary | ICD-10-CM | POA: Diagnosis not present

## 2016-06-01 DIAGNOSIS — I130 Hypertensive heart and chronic kidney disease with heart failure and stage 1 through stage 4 chronic kidney disease, or unspecified chronic kidney disease: Secondary | ICD-10-CM | POA: Diagnosis not present

## 2016-06-01 DIAGNOSIS — I129 Hypertensive chronic kidney disease with stage 1 through stage 4 chronic kidney disease, or unspecified chronic kidney disease: Secondary | ICD-10-CM | POA: Diagnosis not present

## 2016-06-01 DIAGNOSIS — N183 Chronic kidney disease, stage 3 (moderate): Secondary | ICD-10-CM | POA: Diagnosis not present

## 2016-06-01 DIAGNOSIS — E785 Hyperlipidemia, unspecified: Secondary | ICD-10-CM | POA: Diagnosis not present

## 2016-06-01 DIAGNOSIS — G4733 Obstructive sleep apnea (adult) (pediatric): Secondary | ICD-10-CM | POA: Diagnosis not present

## 2016-06-01 DIAGNOSIS — I509 Heart failure, unspecified: Secondary | ICD-10-CM | POA: Diagnosis not present

## 2016-06-01 DIAGNOSIS — E1122 Type 2 diabetes mellitus with diabetic chronic kidney disease: Secondary | ICD-10-CM | POA: Diagnosis not present

## 2016-06-01 DIAGNOSIS — Z7982 Long term (current) use of aspirin: Secondary | ICD-10-CM | POA: Diagnosis not present

## 2016-06-03 ENCOUNTER — Encounter: Payer: Self-pay | Admitting: Cardiovascular Disease

## 2016-06-03 ENCOUNTER — Ambulatory Visit (INDEPENDENT_AMBULATORY_CARE_PROVIDER_SITE_OTHER): Payer: Medicare Other | Admitting: Cardiovascular Disease

## 2016-06-03 VITALS — BP 130/62 | HR 78 | Ht 69.0 in | Wt 384.8 lb

## 2016-06-03 DIAGNOSIS — R0602 Shortness of breath: Secondary | ICD-10-CM | POA: Diagnosis not present

## 2016-06-03 DIAGNOSIS — I10 Essential (primary) hypertension: Secondary | ICD-10-CM | POA: Diagnosis not present

## 2016-06-03 DIAGNOSIS — I5032 Chronic diastolic (congestive) heart failure: Secondary | ICD-10-CM | POA: Diagnosis not present

## 2016-06-03 DIAGNOSIS — I491 Atrial premature depolarization: Secondary | ICD-10-CM

## 2016-06-03 NOTE — Progress Notes (Signed)
Cardiology Office Note   Date:  06/03/2016   ID:  Amber Reyes, DOB Nov 28, 1945, MRN [id number]  PCP:  Loistine Chance, MD  Cardiologist:   Kathlyn Sacramento, MD   Chief Complaint  Patient presents with  . other    Follow up from [facility]. Meds reviewed by the pt's. bottles. Pt. c/o shortness of breath with over exertion.       History of Present Illness: Amber Reyes is a 70 y.o. female who presents for a followup visit regarding chronic diastolic heart failure with recent hospitalization. She was seen previously by me in 2015 for PACs.  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  She was hospitalized recently at [facility] with shortness of breath. BNP was only mildly elevated. CT scan showed no evidence of pulmonary embolism. She improved with diuresis. Echocardiogram showed normal LV systolic function with an ejection fraction of [id number], grade 1 diastolic dysfunction and mildly dilated left atrium. She has been doing well overall since hospital discharge with resolution of leg edema. No chest pain. Shortness of breath is stable with no orthopnea or PND. Her renal function while hospitalized worsened to a creatinine 1.92 with diuresis and after contrast exposure. However, a follow-up basic metabolic profile showed improvement with a creatinine 1.8.   Past Medical History:  Diagnosis Date  . Allergic rhinitis, cause unspecified   . Anxiety   . Chronic diastolic CHF (congestive heart failure) ([place])   . CKD (chronic kidney disease), stage III   . Edema   . Essential hypertension, benign   . Gout, unspecified   . Heart murmur    as child  . Hyperlipidemia   . Lipoma of unspecified site   . Other and unspecified disc disorder of lumbar region   . Other general symptoms(780.99)   . PAC (premature atrial contraction)   .  Renal insufficiency   . Restless legs syndrome (RLS)   . Sebaceous cyst   . Super obesity ([place])   . Type II or unspecified type diabetes mellitus without mention of complication, uncontrolled   . Unspecified asthma(493.90)   . Unspecified disease of pericardium   . Unspecified sleep apnea   . Unspecified vitamin D deficiency   . Vaginitis and vulvovaginitis, unspecified   . Venous insufficiency     Past Surgical History:  Procedure Laterality Date  . CARDIAC CATHETERIZATION  2002   [facility]  . PERICARDIUM SURGERY       Current Outpatient Prescriptions  Medication Sig Dispense Refill  . albuterol (PROVENTIL HFA;VENTOLIN HFA) 108 (90 BASE) MCG/ACT inhaler Inhale 2 puffs into the lungs every 6 (six) hours as needed for wheezing or shortness of breath.    . allopurinol (ZYLOPRIM) 100 MG tablet Take 1 tablet (100 mg total) by mouth 2 (two) times daily. 180 tablet 1  . aspirin EC 81 MG tablet Take 81 mg by mouth daily.    . budesonide (PULMICORT) 0.25 MG/2ML nebulizer solution Take 2 mLs (0.25 mg total) by nebulization 2 (two) times daily. 120 mL 2  . Cholecalciferol (VITAMIN D) 2000 UNITS tablet Take 2,000 Units by mouth daily.    Marland Kitchen COLCRYS 0.6 MG tablet TAKE ONE TABLET BY MOUTH TWICE DAILY AS NEEDED FOR GOUT 30 tablet 5  . diltiazem (CARDIZEM CD) 300 MG 24 hr capsule Take 1 capsule (300 mg total) by mouth daily. 90 capsule 1  .  Elastic Bandages & Supports (MEDICAL COMPRESSION STOCKINGS) MISC 2 each by Does not apply route daily. 2 each 2  . fluticasone (FLONASE) 50 MCG/ACT nasal spray Place 2 sprays into both nostrils daily. 48 g 1  . furosemide (LASIX) 80 MG tablet Take 0.5 tablets (40 mg total) by mouth 2 (two) times daily. 90 tablet 0  . Insulin Lispro Prot & Lispro (HUMALOG MIX 75/25 KWIKPEN) (75-25) 100 UNIT/ML Kwikpen Inject 40-60 Units into the skin 2 (two) times daily. (Patient taking differently: Inject 40-60 Units into the skin 2 (two) times daily. 60 units am, 40 units pm) 15 mL 2   . Insulin Pen Needle (NOVOFINE) 30G X 8 MM MISC Inject 10 each into the skin daily. 100 each 1  . irbesartan (AVAPRO) 300 MG tablet Take 1 tablet (300 mg total) by mouth daily. 90 tablet 1  . pravastatin (PRAVACHOL) 80 MG tablet Take 1 tablet (80 mg total) by mouth daily. (Patient taking differently: Take 80 mg by mouth every evening. ) 90 tablet 1   No current facility-administered medications for this visit.     Allergies:   Review of patient's allergies indicates no active allergies.    Social History:  The patient  reports that she has never smoked. She has never used smokeless tobacco. She reports that she does not drink alcohol or use drugs.   Family History:  The patient's family history includes Heart attack in her mother; Hypertension in her mother.    ROS:  Please see the history of present illness.   Otherwise, review of systems are positive for none.   All other systems are reviewed and negative.    PHYSICAL EXAM: VS:  BP 130/62 (BP Location: Left Wrist, Patient Position: Sitting, Cuff Size: Normal)   Pulse 78   Ht 5\' 9"  (1.753 m)   Wt (!) 384 lb 12 oz (174.5 kg)   BMI 56.82 kg/m  , BMI Body mass index is 56.82 kg/m. GEN: Well nourished, well developed, in no acute distress  HEENT: normal  Neck: no JVD, carotid bruits, or masses Cardiac: RRR; no murmurs, rubs, or gallops,no edema  Respiratory:  clear to auscultation bilaterally, normal work of breathing GI: soft, nontender, nondistended, + BS MS: no deformity or atrophy  Skin: warm and dry, no rash Neuro:  Strength and sensation are intact Psych: euthymic mood, full affect   EKG:  EKG is ordered today. The ekg ordered today demonstrates normal sinus rhythm with no significant ST or T wave changes.   Recent Labs: 04/20/2016: ALT 7 05/20/2016: B Natriuretic Peptide 198.0 05/21/2016: Hemoglobin 11.3; Platelets 223 05/22/2016: BUN 39; Creatinine, Ser 1.98; Potassium 4.4; Sodium 136    Lipid Panel    Component  Value Date/Time   CHOL 143 04/20/2016 0907   CHOL 139 04/16/2015 1138   TRIG 86 04/20/2016 0907   HDL 52 04/20/2016 0907   HDL 52 04/16/2015 1138   CHOLHDL 2.8 04/20/2016 0907   VLDL 17 04/20/2016 0907   LDLCALC 74 04/20/2016 0907   LDLCALC 70 04/16/2015 1138      Wt Readings from Last 3 Encounters:  06/03/16 (!) 384 lb 12 oz (174.5 kg)  05/22/16 (!) 393 lb 6 oz (178.4 kg)  04/20/16 (!) 399 lb 1.6 oz (181 kg)       No flowsheet data found.    ASSESSMENT AND PLAN:  1.   Chronic diastolic heart failure: She appears to be euvolemic on current dose of furosemide 40 mg twice daily. I  had a prolonged discussion with her about the importance of controlling her risk factors. I discussed with her also the importance of low sodium diet.  2. PACs: This has not been an issue and she reports no palpitations.  3. Essential hypertension: Blood pressure is controlled on current medications.  4. Morbid obesity: We discussed options for weight loss and I agree with consideration of bariatric clinic.  She is trying to lose weight and her own.   5. Obstructive sleep apnea: I encouraged her to use her CPAP in a regular basis.   Disposition:   FU with me in 6 months  Signed,  Kathlyn Sacramento, MD  06/03/2016 1:55 PM    [facility]

## 2016-06-03 NOTE — Patient Instructions (Addendum)
Medication Instructions:  Your physician recommends that you continue on your current medications as directed. Please refer to the Current Medication list given to you today.   Labwork: none  Testing/Procedures: none  Follow-Up: Your physician wants you to follow-up in: six months with Dr. Fletcher Anon. You will receive a reminder letter in the mail two months in advance. If you don't receive a letter, please call our office to schedule the follow-up appointment.   Any Other Special Instructions Will Be Listed Below (If Applicable).      If you need a refill on your cardiac medications before your next appointment, please call your pharmacy.   Low-Sodium Eating Plan Sodium raises blood pressure and causes water to be held in the body. Getting less sodium from food will help lower your blood pressure, reduce any swelling, and protect your heart, liver, and kidneys. We get sodium by adding salt (sodium chloride) to food. Most of our sodium comes from canned, boxed, and frozen foods. Restaurant foods, fast foods, and pizza are also very high in sodium. Even if you take medicine to lower your blood pressure or to reduce fluid in your body, getting less sodium from your food is important. WHAT IS MY PLAN? Most people should limit their sodium intake to 2,300 mg a day. Your health care provider recommends that you limit your sodium intake. WHAT DO I NEED TO KNOW ABOUT THIS EATING PLAN? For the low-sodium eating plan, you will follow these general guidelines:  Choose foods with a % Daily Value for sodium of less than 5% (as listed on the food label).   Use salt-free seasonings or herbs instead of table salt or sea salt.   Check with your health care provider or pharmacist before using salt substitutes.   Eat fresh foods.  Eat more vegetables and fruits.  Limit canned vegetables. If you do use them, rinse them well to decrease the sodium.   Limit cheese to 1 oz (28 g) per day.    Eat lower-sodium products, often labeled as "lower sodium" or "no salt added."  Avoid foods that contain monosodium glutamate (MSG). MSG is sometimes added to [place] food and some canned foods.  Check food labels (Nutrition Facts labels) on foods to learn how much sodium is in one serving.  Eat more home-cooked food and less restaurant, buffet, and fast food.  When eating at a restaurant, ask that your food be prepared with less salt, or no salt if possible.  HOW DO I READ FOOD LABELS FOR SODIUM INFORMATION? The Nutrition Facts label lists the amount of sodium in one serving of the food. If you eat more than one serving, you must multiply the listed amount of sodium by the number of servings. Food labels may also identify foods as:  Sodium free--Less than 5 mg in a serving.  Very low sodium--35 mg or less in a serving.  Low sodium--140 mg or less in a serving.  Light in sodium--50% less sodium in a serving. For example, if a food that usually has 300 mg of sodium is changed to become light in sodium, it will have 150 mg of sodium.  Reduced sodium--25% less sodium in a serving. For example, if a food that usually has 400 mg of sodium is changed to reduced sodium, it will have 300 mg of sodium. WHAT FOODS CAN I EAT? Grains Low-sodium cereals, including oats, puffed wheat and rice, and shredded wheat cereals. Low-sodium crackers. Unsalted rice and pasta. Lower-sodium bread.  Vegetables Frozen  or fresh vegetables. Low-sodium or reduced-sodium canned vegetables. Low-sodium or reduced-sodium tomato sauce and paste. Low-sodium or reduced-sodium tomato and vegetable juices.  Fruits Fresh, frozen, and canned fruit. Fruit juice.  Meat and Other Protein Products Low-sodium canned tuna and salmon. Fresh or frozen meat, poultry, seafood, and fish. Lamb. Unsalted nuts. Dried beans, peas, and lentils without added salt. Unsalted canned beans. Homemade soups without salt. Eggs.   Dairy Milk. Soy milk. Ricotta cheese. Low-sodium or reduced-sodium cheeses. Yogurt.  Condiments Fresh and dried herbs and spices. Salt-free seasonings. Onion and garlic powders. Low-sodium varieties of mustard and ketchup. Fresh or refrigerated horseradish. Lemon juice.  Fats and Oils Reduced-sodium salad dressings. Unsalted butter.  Other Unsalted popcorn and pretzels.  The items listed above may not be a complete list of recommended foods or beverages. Contact your dietitian for more options. WHAT FOODS ARE NOT RECOMMENDED? Grains Instant hot cereals. Bread stuffing, pancake, and biscuit mixes. Croutons. Seasoned rice or pasta mixes. Noodle soup cups. Boxed or frozen macaroni and cheese. Self-rising flour. Regular salted crackers. Vegetables Regular canned vegetables. Regular canned tomato sauce and paste. Regular tomato and vegetable juices. Frozen vegetables in sauces. Salted [place] fries. Olives. Angie Fava. Relishes. Sauerkraut. Salsa. Meat and Other Protein Products Salted, canned, smoked, spiced, or pickled meats, seafood, or fish. Bacon, ham, sausage, hot dogs, corned beef, chipped beef, and packaged luncheon meats. Salt pork. Jerky. Pickled herring. Anchovies, regular canned tuna, and sardines. Salted nuts. Dairy Processed cheese and cheese spreads. Cheese curds. Blue cheese and cottage cheese. Buttermilk.  Condiments Onion and garlic salt, seasoned salt, table salt, and sea salt. Canned and packaged gravies. Worcestershire sauce. Tartar sauce. Barbecue sauce. Teriyaki sauce. Soy sauce, including reduced sodium. Steak sauce. Fish sauce. Oyster sauce. Cocktail sauce. Horseradish that you find on the shelf. Regular ketchup and mustard. Meat flavorings and tenderizers. Bouillon cubes. Hot sauce. Tabasco sauce. Marinades. Taco seasonings. Relishes. Fats and Oils Regular salad dressings. Salted butter. Margarine. Ghee. Bacon fat.  Other Potato and tortilla chips. Corn chips  and puffs. Salted popcorn and pretzels. Canned or dried soups. Pizza. Frozen entrees and pot pies.  The items listed above may not be a complete list of foods and beverages to avoid. Contact your dietitian for more information.   This information is not intended to replace advice given to you by your health care provider. Make sure you discuss any questions you have with your health care provider.   Document Released: 01/29/2002 Document Revised: 08/30/2014 Document Reviewed: 06/13/2013 Elsevier Interactive Patient Education [employer].

## 2016-06-04 ENCOUNTER — Encounter: Payer: Self-pay | Admitting: Family Medicine

## 2016-06-04 ENCOUNTER — Telehealth: Payer: Self-pay | Admitting: Family Medicine

## 2016-06-04 ENCOUNTER — Ambulatory Visit (INDEPENDENT_AMBULATORY_CARE_PROVIDER_SITE_OTHER): Payer: Medicare Other | Admitting: Family Medicine

## 2016-06-04 VITALS — BP 118/67 | HR 82 | Temp 98.3°F | Resp 16 | Ht 70.0 in | Wt 385.8 lb

## 2016-06-04 DIAGNOSIS — Z1231 Encounter for screening mammogram for malignant neoplasm of breast: Secondary | ICD-10-CM

## 2016-06-04 DIAGNOSIS — Z23 Encounter for immunization: Secondary | ICD-10-CM

## 2016-06-04 DIAGNOSIS — I5033 Acute on chronic diastolic (congestive) heart failure: Secondary | ICD-10-CM

## 2016-06-04 DIAGNOSIS — Z09 Encounter for follow-up examination after completed treatment for conditions other than malignant neoplasm: Secondary | ICD-10-CM

## 2016-06-04 DIAGNOSIS — I110 Hypertensive heart disease with heart failure: Secondary | ICD-10-CM | POA: Diagnosis not present

## 2016-06-04 MED ORDER — DILTIAZEM HCL ER COATED BEADS 240 MG PO CP24
240.0000 mg | ORAL_CAPSULE | Freq: Every day | ORAL | 0 refills | Status: DC
Start: 2016-06-04 — End: 2016-07-02

## 2016-06-04 NOTE — Progress Notes (Signed)
Name: Amber Reyes   MRN: [id number]    DOB: 1945-12-02   Date:06/04/2016       Progress Note  Subjective  Chief Complaint  Chief Complaint  Patient presents with  . Hospitalization Follow-up    Swelling in feet     HPI  Acute on Chronic Diastolic heart failure: she went to [facility] on 09?29/2017 with worsening of swelling and SOB, she had a CT chest that was negative for PE, Echocardiogram showed normal LV systolic function with an ejection fraction of 27%, grade 1 diastolic dysfunction and mildly dilated left atrium. She was given diuretics and advised to improved compliance with CPAP machine and discharged home on oxygen while sleeping, however the order was not sent to her homehealth provider - [place], therefore she has not started the oxygen yet. She has been wearing CPAP every night since discharge. Swelling is down and and she denies wheezing or PND. She was seen by Dr. Fletcher Anon yesterday and also seen by Dr. Alfonse Alpers and Hydralazine was discontinued because of orthostatic changes. Kidney function was checked this week and is improving since discharge.   Hypertension heart Failure: bp is still towards low end of normal , bp at home has been  Patient Active Problem List   Diagnosis Date Noted  . Acute on chronic diastolic congestive heart failure ([place]) 05/21/2016  . Venous insufficiency 05/21/2016  . Hypertensive heart disease 05/21/2016  . Anemia in chronic kidney disease 12/22/2015  . Pulmonary hypertension 12/18/2015  . 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 Anxiety 04/12/2015  . Chronic kidney disease (CKD), stage III (moderate) 04/12/2015  . Edema extremities 04/12/2015  . 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 Asthma, moderate persistent 04/12/2015  . 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 Obstructive apnea 04/12/2015  . Restless leg 04/12/2015  . Allergic rhinitis 04/12/2015  . Vitamin D deficiency 04/12/2015  . Controlled gout 04/12/2015  . Premature atrial contractions  11/29/2013  . Benign essential hypertension   . Hyperlipidemia     Past Surgical History:  Procedure Laterality Date  . CARDIAC CATHETERIZATION  2002   [facility]  . PERICARDIUM SURGERY      Family History  Problem Relation Age of Onset  . Heart attack Mother   . Hypertension Mother     Social History   Social History  . Marital status: Married    Spouse name: N/A  . Number of children: N/A  . Years of education: N/A   Occupational History  . retired    Social History Main Topics  . Smoking status: Never Smoker  . Smokeless tobacco: Never Used  . Alcohol use No  . Drug use: No  . Sexual activity: Not Currently   Other Topics Concern  . Not on file   Social History Narrative  . No narrative on file     Current Outpatient Prescriptions:  .  albuterol (PROVENTIL HFA;VENTOLIN HFA) 108 (90 BASE) MCG/ACT inhaler, Inhale 2 puffs into the lungs every 6 (six) hours as needed for wheezing or shortness of breath., Disp: , Rfl:  .  allopurinol (ZYLOPRIM) 100 MG tablet, Take 1 tablet (100 mg total) by mouth 2 (two) times daily., Disp: 180 tablet, Rfl: 1 .  aspirin EC 81 MG tablet, Take 81 mg by mouth daily., Disp: , Rfl:  .  budesonide (PULMICORT) 0.25 MG/2ML nebulizer solution, Take 2 mLs (0.25 mg total) by nebulization 2 (two) times daily., Disp: 120 mL, Rfl: 2 .  Cholecalciferol (VITAMIN D) 2000 UNITS tablet,  Take 2,000 Units by mouth daily., Disp: , Rfl:  .  COLCRYS 0.6 MG tablet, TAKE ONE TABLET BY MOUTH TWICE DAILY AS NEEDED FOR GOUT, Disp: 30 tablet, Rfl: 5 .  diltiazem (CARDIZEM CD) 300 MG 24 hr capsule, Take 1 capsule (300 mg total) by mouth daily., Disp: 90 capsule, Rfl: 1 .  Elastic Bandages & Supports ([place]) MISC, 2 each by Does not apply route daily., Disp: 2 each, Rfl: 2 .  fluticasone (FLONASE) 50 MCG/ACT nasal spray, Place 2 sprays into both nostrils daily., Disp: 48 g, Rfl: 1 .  furosemide (LASIX) 80 MG tablet, Take 0.5 tablets (40 mg  total) by mouth 2 (two) times daily., Disp: 90 tablet, Rfl: 0 .  Insulin Lispro Prot & Lispro (HUMALOG MIX 75/25 KWIKPEN) (75-25) 100 UNIT/ML Kwikpen, Inject 40-60 Units into the skin 2 (two) times daily. (Patient taking differently: Inject 40-60 Units into the skin 2 (two) times daily. 60 units am, 40 units pm), Disp: 15 mL, Rfl: 2 .  Insulin Pen Needle (NOVOFINE) 30G X 8 MM MISC, Inject 10 each into the skin daily., Disp: 100 each, Rfl: 1 .  irbesartan (AVAPRO) 300 MG tablet, Take 1 tablet (300 mg total) by mouth daily., Disp: 90 tablet, Rfl: 1 .  pravastatin (PRAVACHOL) 80 MG tablet, Take 1 tablet (80 mg total) by mouth daily. (Patient taking differently: Take 80 mg by mouth every evening. ), Disp: 90 tablet, Rfl: 1  No Known Allergies   ROS  Ten systems reviewed and is negative except as mentioned in HPI   Objective  Vitals:   06/04/16 0758  BP: 118/67  Pulse: 82  Resp: 16  Temp: 98.3 F (36.8 C)  TempSrc: Oral  SpO2: 97%  Weight: (!) 385 lb 12.8 oz (175 kg)  Height: '5\' 10"'  (1.778 m)    Body mass index is 55.36 kg/m.  Physical Exam  Constitutional: Patient appears well-developed and well-nourished. Obese  No distress.  HEENT: head atraumatic, normocephalic, pupils equal and reactive to light, neck supple, throat within normal limits Cardiovascular: Normal rate, regular rhythm and normal heart sounds.  No murmur heard. Trace  BLE edema. Pulmonary/Chest: Effort normal and breath sounds normal. No respiratory distress. Abdominal: Soft.  There is no tenderness. Psychiatric: Patient has a normal mood and affect. behavior is normal. Judgment and thought content normal.  Recent Results (from the past 2160 hour(s))  Lipid panel     Status: None   Collection Time: 04/20/16  9:07 AM  Result Value Ref Range   Cholesterol 143 125 - 200 mg/dL   Triglycerides 86 <150 mg/dL   HDL 52 >=46 mg/dL   Total CHOL/HDL Ratio 2.8 <=5.0 Ratio   VLDL 17 <30 mg/dL   LDL Cholesterol 74 <130  mg/dL    Comment:   Total Cholesterol/HDL Ratio:CHD Risk                        Coronary Heart Disease Risk Table                                        Men       Women          1/2 Average Risk              3.4        3.3  Average Risk              5.0        4.4           2X Average Risk              9.6        7.1           3X Average Risk             23.4       11.0 Use the calculated Patient Ratio above and the CHD Risk table  to determine the patient's CHD Risk.   COMPLETE METABOLIC PANEL WITH GFR     Status: Abnormal   Collection Time: 04/20/16  9:07 AM  Result Value Ref Range   Sodium 140 135 - 146 mmol/L   Potassium 5.1 3.5 - 5.3 mmol/L   Chloride 102 98 - 110 mmol/L   CO2 25 20 - 31 mmol/L   Glucose, Bld 168 (H) 65 - 99 mg/dL   BUN 40 (H) 7 - 25 mg/dL   Creat 1.66 (H) 0.50 - 0.99 mg/dL    Comment:   For patients > or = 68 years of age: The upper reference limit for Creatinine is approximately 13% higher for people identified as African-American.      Total Bilirubin 0.4 0.2 - 1.2 mg/dL   Alkaline Phosphatase 96 33 - 130 U/L   AST 14 10 - 35 U/L   ALT 7 6 - 29 U/L   Total Protein 7.0 6.1 - 8.1 g/dL   Albumin 3.7 3.6 - 5.1 g/dL   Calcium 9.1 8.6 - 10.4 mg/dL   GFR, Est African American 36 (L) >=60 mL/min   GFR, Est Non African American 31 (L) >=60 mL/min  Hemoglobin A1c     Status: Abnormal   Collection Time: 04/20/16  9:07 AM  Result Value Ref Range   Hgb A1c MFr Bld 7.2 (H) <5.7 %    Comment:   For someone without known diabetes, a hemoglobin A1c value of 6.5% or greater indicates that they may have diabetes and this should be confirmed with a follow-up test.   For someone with known diabetes, a value <7% indicates that their diabetes is well controlled and a value greater than or equal to 7% indicates suboptimal control. A1c targets should be individualized based on duration of diabetes, age, comorbid conditions, and other considerations.    Currently, no consensus exists for use of hemoglobin A1c for diagnosis of diabetes for children.      Mean Plasma Glucose 160 mg/dL  Uric acid     Status: None   Collection Time: 04/20/16  9:07 AM  Result Value Ref Range   Uric Acid, Serum 5.2 2.5 - 7.0 mg/dL    Comment: Therapeutic target for gout patients: <6.0 mg/dL  Hemoglobin and Hematocrit, Blood     Status: Abnormal   Collection Time: 04/20/16  9:07 AM  Result Value Ref Range   Hemoglobin 11.4 (L) 11.7 - 15.5 g/dL   HCT 35.5 35.0 - 45.0 %  CBC     Status: Abnormal   Collection Time: 05/20/16 11:41 PM  Result Value Ref Range   WBC 11.3 (H) 3.6 - 11.0 K/uL   RBC 3.91 3.80 - 5.20 MIL/uL   Hemoglobin 11.1 (L) 12.0 - 16.0 g/dL   HCT 33.5 (L) 35.0 - 47.0 %   MCV 85.8 80.0 - 100.0 fL   MCH 28.4 26.0 -  34.0 pg   MCHC 33.2 32.0 - 36.0 g/dL   RDW 16.1 (H) 11.5 - 14.5 %   Platelets 232 150 - 440 K/uL  Basic metabolic panel     Status: Abnormal   Collection Time: 05/20/16 11:41 PM  Result Value Ref Range   Sodium 136 135 - 145 mmol/L   Potassium 4.6 3.5 - 5.1 mmol/L   Chloride 102 101 - 111 mmol/L   CO2 23 22 - 32 mmol/L   Glucose, Bld 158 (H) 65 - 99 mg/dL   BUN 29 (H) 6 - 20 mg/dL   Creatinine, Ser 1.46 (H) 0.44 - 1.00 mg/dL   Calcium 8.9 8.9 - 10.3 mg/dL   GFR calc non Af Amer 36 (L) >60 mL/min   GFR calc Af Amer 41 (L) >60 mL/min    Comment: (NOTE) The eGFR has been calculated using the CKD EPI equation. This calculation has not been validated in all clinical situations. eGFR's persistently <60 mL/min signify possible Chronic Kidney Disease.    Anion gap 11 5 - 15  Troponin I     Status: None   Collection Time: 05/20/16 11:41 PM  Result Value Ref Range   Troponin I <0.03 <0.03 ng/mL  Brain natriuretic peptide     Status: Abnormal   Collection Time: 05/20/16 11:41 PM  Result Value Ref Range   B Natriuretic Peptide 198.0 (H) 0.0 - 100.0 pg/mL  Fibrin derivatives D-Dimer     Status: Abnormal   Collection Time:  05/20/16 11:41 PM  Result Value Ref Range   Fibrin derivatives D-dimer ([facility]) 663 (H) 0 - 499    Comment: <> Exclusion of Venous Thromboembolism (VTE) - OUTPATIENTS ONLY        (Emergency Department or [place])             0-499 ng/ml (FEU)  : With a low to intermediate pretest                                        probability for VTE this test result                                        excludes the diagnosis of VTE.           > 499 ng/ml (FEU)  : VTE not excluded.  Additional work up                                   for VTE is required.   <>  Testing on Inpatients and Evaluation of Disseminated Intravascular        Coagulation (DIC)             Reference Range:   0-499 ng/ml (FEU)   Glucose, capillary     Status: Abnormal   Collection Time: 05/21/16  8:32 AM  Result Value Ref Range   Glucose-Capillary 206 (H) 65 - 99 mg/dL  CBC     Status: Abnormal   Collection Time: 05/21/16  8:44 AM  Result Value Ref Range   WBC 9.6 3.6 - 11.0 K/uL   RBC 3.86 3.80 - 5.20 MIL/uL   Hemoglobin 11.3 (L) 12.0 - 16.0 g/dL   HCT 32.7 (L) 35.0 - 47.0 %  MCV 84.7 80.0 - 100.0 fL   MCH 29.4 26.0 - 34.0 pg   MCHC 34.7 32.0 - 36.0 g/dL   RDW 15.9 (H) 11.5 - 14.5 %   Platelets 223 150 - 440 K/uL  Creatinine, serum     Status: Abnormal   Collection Time: 05/21/16  8:44 AM  Result Value Ref Range   Creatinine, Ser 1.44 (H) 0.44 - 1.00 mg/dL   GFR calc non Af Amer 36 (L) >60 mL/min   GFR calc Af Amer 42 (L) >60 mL/min    Comment: (NOTE) The eGFR has been calculated using the CKD EPI equation. This calculation has not been validated in all clinical situations. eGFR's persistently <60 mL/min signify possible Chronic Kidney Disease.   Troponin I     Status: None   Collection Time: 05/21/16  8:44 AM  Result Value Ref Range   Troponin I <0.03 <0.03 ng/mL  Glucose, capillary     Status: Abnormal   Collection Time: 05/21/16 11:46 AM  Result Value Ref Range   Glucose-Capillary 165 (H) 65 - 99  mg/dL  Troponin I     Status: None   Collection Time: 05/21/16  2:26 PM  Result Value Ref Range   Troponin I <0.03 <0.03 ng/mL  Echocardiogram     Status: Abnormal   Collection Time: 05/21/16  2:33 PM  Result Value Ref Range   Weight 6,360 oz   Height 70 in   BP 155/85 mmHg   LV PW d 12.9 (A) 0.6 - 1.1 mm   FS 30 28 - 44 %   LA vol 68.2 mL   LA ID, A-P, ES 41 mm   IVS/LV PW RATIO, ED .9    LV e' LATERAL 9.57 cm/s   LV E/e' medial 9.83    LV E/e'average 9.83    AV pk vel 151 cm/s   AV Area VTI 1.99 cm2   LA diam index 1.47 cm/m2   LA vol A4C 75.4 ml   E decel time 261 msec   LVOT diameter 22 mm   LVOT area 3.80 cm2   LVOT peak vel 79.1 cm/s   Ao pk vel .52 m/s   Peak grad 9 mmHg   Peak grad 4 mmHg   E/e' ratio 9.83    MV pk E vel 94.1 m/s   LA vol index 24.4 mL/m2   AV peak Index .71    MV Dec 261    LA diam end sys 41.00 mm   TDI e' medial 7.72    TDI e' lateral 9.57   Glucose, capillary     Status: Abnormal   Collection Time: 05/21/16  2:47 PM  Result Value Ref Range   Glucose-Capillary 50 (L) 65 - 99 mg/dL  Glucose, capillary     Status: Abnormal   Collection Time: 05/21/16  3:21 PM  Result Value Ref Range   Glucose-Capillary 124 (H) 65 - 99 mg/dL  Glucose, capillary     Status: Abnormal   Collection Time: 05/21/16  4:20 PM  Result Value Ref Range   Glucose-Capillary 176 (H) 65 - 99 mg/dL  Troponin I     Status: None   Collection Time: 05/21/16  8:29 PM  Result Value Ref Range   Troponin I <0.03 <0.03 ng/mL  Glucose, capillary     Status: Abnormal   Collection Time: 05/21/16  9:05 PM  Result Value Ref Range   Glucose-Capillary 180 (H) 65 - 99 mg/dL   Comment 1 Notify  RN    Comment 2 Document in Chart   Basic metabolic panel     Status: Abnormal   Collection Time: 05/22/16  4:28 AM  Result Value Ref Range   Sodium 136 135 - 145 mmol/L   Potassium 4.4 3.5 - 5.1 mmol/L   Chloride 99 (L) 101 - 111 mmol/L   CO2 32 22 - 32 mmol/L   Glucose, Bld 127 (H)  65 - 99 mg/dL   BUN 39 (H) 6 - 20 mg/dL   Creatinine, Ser 1.98 (H) 0.44 - 1.00 mg/dL   Calcium 8.7 (L) 8.9 - 10.3 mg/dL   GFR calc non Af Amer 25 (L) >60 mL/min   GFR calc Af Amer 29 (L) >60 mL/min    Comment: (NOTE) The eGFR has been calculated using the CKD EPI equation. This calculation has not been validated in all clinical situations. eGFR's persistently <60 mL/min signify possible Chronic Kidney Disease.    Anion gap 5 5 - 15  Glucose, capillary     Status: Abnormal   Collection Time: 05/22/16  7:51 AM  Result Value Ref Range   Glucose-Capillary 130 (H) 65 - 99 mg/dL  Glucose, capillary     Status: Abnormal   Collection Time: 05/22/16 12:18 PM  Result Value Ref Range   Glucose-Capillary 128 (H) 65 - 99 mg/dL     PHQ2/9: Depression screen [facility] 2/9 06/04/2016 04/20/2016 01/20/2016 12/18/2015 11/17/2015  Decreased Interest 0 0 0 0 0  Down, Depressed, Hopeless 0 0 0 0 0  PHQ - 2 Score 0 0 0 0 0     Fall Risk: Fall Risk  06/04/2016 04/20/2016 01/20/2016 12/18/2015 11/17/2015  Falls in the past year? No No No No No      Functional Status Survey: Is the patient deaf or have difficulty hearing?: No Does the patient have difficulty seeing, even when wearing glasses/contacts?: Yes (glasses) Does the patient have difficulty concentrating, remembering, or making decisions?: No Does the patient have difficulty walking or climbing stairs?: Yes Does the patient have difficulty dressing or bathing?: No Does the patient have difficulty doing errands alone such as visiting a doctor's office or shopping?: No    Assessment & Plan  1. Hospital discharge follow-up  Doing well, seen by cardiologist   2. Acute on chronic diastolic congestive heart failure (HCC)  Back to chronic diastolic heart failure  3. Hypertensive heart disease with heart failure (HCC)  We will decrease dose of Diltiazem since bp is in the low teens range - diltiazem (CARTIA XT) 240 MG 24 hr capsule; Take 1  capsule (240 mg total) by mouth daily.  Dispense: 30 capsule; Refill: 0  4. Need for pneumococcal vaccination  - Pneumococcal polysaccharide vaccine 23-valent greater than or equal to 2yo subcutaneous/IM  5. Encounter for screening mammogram for breast cancer  - MM Digital Screening; Future

## 2016-06-04 NOTE — Telephone Encounter (Signed)
Pt states she wanted to let Dr Ancil Boozer know she got her oxygen machine from [place] order # is [phone]

## 2016-06-09 ENCOUNTER — Ambulatory Visit: Payer: Medicare Other | Admitting: Family

## 2016-06-09 NOTE — Telephone Encounter (Signed)
Sent Sleep Study to [facility] due to [facility] stating she needs one before she could receive supplies. Patient has been notified.

## 2016-06-10 ENCOUNTER — Telehealth: Payer: Self-pay | Admitting: Family

## 2016-06-10 NOTE — Telephone Encounter (Signed)
Patient missed her initial appointment at the [facility] on 06/09/16. Will attempt to reschedule.

## 2016-06-11 ENCOUNTER — Ambulatory Visit: Payer: Medicare Other | Admitting: Family Medicine

## 2016-06-11 DIAGNOSIS — G4733 Obstructive sleep apnea (adult) (pediatric): Secondary | ICD-10-CM | POA: Diagnosis not present

## 2016-06-16 ENCOUNTER — Encounter: Payer: Self-pay | Admitting: Family Medicine

## 2016-06-30 DIAGNOSIS — E113393 Type 2 diabetes mellitus with moderate nonproliferative diabetic retinopathy without macular edema, bilateral: Secondary | ICD-10-CM | POA: Diagnosis not present

## 2016-06-30 LAB — HM DIABETES EYE EXAM

## 2016-07-02 ENCOUNTER — Other Ambulatory Visit: Payer: Self-pay | Admitting: Family Medicine

## 2016-07-02 DIAGNOSIS — I110 Hypertensive heart disease with heart failure: Secondary | ICD-10-CM

## 2016-07-02 NOTE — Telephone Encounter (Signed)
Patient requesting refill of Diltiazem and Diflucan to [facility].

## 2016-07-07 ENCOUNTER — Ambulatory Visit (INDEPENDENT_AMBULATORY_CARE_PROVIDER_SITE_OTHER): Payer: Medicare Other | Admitting: Podiatry

## 2016-07-07 VITALS — BP 158/80 | HR 81 | Resp 16

## 2016-07-07 DIAGNOSIS — L600 Ingrowing nail: Secondary | ICD-10-CM

## 2016-07-07 NOTE — Patient Instructions (Signed)

## 2016-07-08 NOTE — Progress Notes (Signed)
Subjective:     Patient ID: Amber Reyes, female   DOB: 1946/05/19, 70 y.o.   MRN: [id number]  HPI severely obese female with ingrown toenail deformity hallux bilateral that are painful when pressed and she cannot cut herself. 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 I infiltrated each hallux 60 mg Xylocaine Marcaine mixture remove the corners exposed matrix and applied phenol 3 applications 30 seconds followed by alcohol lavage and sterile dressing. Gave instructions on soaks and reappoint

## 2016-07-20 ENCOUNTER — Telehealth: Payer: Self-pay | Admitting: *Deleted

## 2016-07-20 NOTE — Telephone Encounter (Signed)
Tried to call patient at [phone] (Home #) to check to see how they were doing from their ingrown toenail procedure that was performed on Wednesday, July 07, 2016. I was not able to leave a message because the answering machine cut me off. Tried to call patient twice today.

## 2016-08-07 ENCOUNTER — Other Ambulatory Visit: Payer: Self-pay | Admitting: Family Medicine

## 2016-08-07 DIAGNOSIS — I110 Hypertensive heart disease with heart failure: Secondary | ICD-10-CM

## 2016-08-25 ENCOUNTER — Encounter: Payer: Self-pay | Admitting: Family Medicine

## 2016-08-25 ENCOUNTER — Ambulatory Visit (INDEPENDENT_AMBULATORY_CARE_PROVIDER_SITE_OTHER): Payer: Medicare PPO | Admitting: Family Medicine

## 2016-08-25 VITALS — BP 150/78 | HR 91 | Temp 98.1°F | Resp 18 | Ht 70.0 in | Wt 395.2 lb

## 2016-08-25 DIAGNOSIS — I10 Essential (primary) hypertension: Secondary | ICD-10-CM

## 2016-08-25 DIAGNOSIS — N183 Chronic kidney disease, stage 3 (moderate): Secondary | ICD-10-CM | POA: Diagnosis not present

## 2016-08-25 DIAGNOSIS — Z794 Long term (current) use of insulin: Secondary | ICD-10-CM

## 2016-08-25 DIAGNOSIS — IMO0002 Reserved for concepts with insufficient information to code with codable children: Secondary | ICD-10-CM

## 2016-08-25 DIAGNOSIS — J4540 Moderate persistent asthma, uncomplicated: Secondary | ICD-10-CM

## 2016-08-25 DIAGNOSIS — E782 Mixed hyperlipidemia: Secondary | ICD-10-CM | POA: Diagnosis not present

## 2016-08-25 DIAGNOSIS — I491 Atrial premature depolarization: Secondary | ICD-10-CM | POA: Diagnosis not present

## 2016-08-25 DIAGNOSIS — G4733 Obstructive sleep apnea (adult) (pediatric): Secondary | ICD-10-CM | POA: Diagnosis not present

## 2016-08-25 DIAGNOSIS — M109 Gout, unspecified: Secondary | ICD-10-CM

## 2016-08-25 DIAGNOSIS — E1122 Type 2 diabetes mellitus with diabetic chronic kidney disease: Secondary | ICD-10-CM

## 2016-08-25 DIAGNOSIS — E1165 Type 2 diabetes mellitus with hyperglycemia: Secondary | ICD-10-CM | POA: Diagnosis not present

## 2016-08-25 DIAGNOSIS — I110 Hypertensive heart disease with heart failure: Secondary | ICD-10-CM | POA: Diagnosis not present

## 2016-08-25 LAB — POCT GLYCOSYLATED HEMOGLOBIN (HGB A1C): Hemoglobin A1C: 7

## 2016-08-25 MED ORDER — DULAGLUTIDE 1.5 MG/0.5ML ~~LOC~~ SOAJ: 1.5000 mg | SUBCUTANEOUS | 0 refills | Status: DC

## 2016-08-25 MED ORDER — PRAVASTATIN SODIUM 80 MG PO TABS: 80.0000 mg | ORAL_TABLET | Freq: Every evening | ORAL | 1 refills | Status: DC

## 2016-08-25 MED ORDER — ALLOPURINOL 100 MG PO TABS: 100.0000 mg | ORAL_TABLET | Freq: Two times a day (BID) | ORAL | 1 refills | Status: DC

## 2016-08-25 MED ORDER — FUROSEMIDE 40 MG PO TABS: 40.0000 mg | ORAL_TABLET | Freq: Two times a day (BID) | ORAL | 1 refills | Status: DC | PRN

## 2016-08-25 MED ORDER — INSULIN PEN NEEDLE 30G X 8 MM MISC: 1.0000 | Freq: Every day | 1 refills | Status: DC

## 2016-08-25 MED ORDER — METOPROLOL SUCCINATE ER 200 MG PO TB24: 100.0000 mg | ORAL_TABLET | Freq: Every day | ORAL | 0 refills | Status: DC

## 2016-08-25 MED ORDER — IRBESARTAN 300 MG PO TABS: 300.0000 mg | ORAL_TABLET | Freq: Every day | ORAL | 1 refills | Status: DC

## 2016-08-25 NOTE — Progress Notes (Signed)
Name: Amber Reyes   MRN: [id number]    DOB: [birth year]   Date:08/25/2016       Progress Note  Subjective  Chief Complaint  Chief Complaint  Patient presents with  . Hypertension    4 month follow up pt has not had any issues   . Sleep Apnea  . Gout  . Diabetes    HPI  Asthma Moderate: she had a flare that required going to [facility] back in May 2017. She is now using Albuterol solution two times a day,  she has been taking Advair again, but will go back to pulmicort because of cost. She has daily SOB that is likely multifactorial, mild intermittent cough, no wheezing.   DM: she forgot to bring her sugar log of her glucose has been well controlled, glucose from 90's-130's  She has CKI and is on ARB. She is seeing nephrologist at [facility], Dr. Alfonse Alpers She is off Metformin, last GFR 33 .  She is drinking plenty of water and using Insulin as prescribed. She denies polyphagia, polydipsia or polyuria. On aspirin and statin therapy.   Morbid Obesity: she has gained almost 10 lbs since last visit, no longer drinking sodas or sweet tea, drinking more water and is eating healthier, but she states she has not been walking because she had toenails removed and also back pain ( explained moving will help her lose weight)  HTN: bp at home has been in the low range of normal, she is on lower dose of Cardizem down from 360 to 240mg  and denies dizziness since the change, no  chest pain, but she has occasionl palpitation, reviewed records and she does not have Afib but she has a history of PAC and seen by Dr. Fletcher Anon. Taking Cardizem, Avapro , off Hydralazine, discussed changing from Cardizem to Toprol ( even though she has asthma) because of history of CHF  Hypertensive CHF: diastolic, on ARB, aspirin, we will add Metoprolol, taking lasix 80 mg  half pill   OSA/pulmonary hypertension: she is wearing CPAP nightly  CKI stage III: occasionally has pruritis arms/ from dry skin, it improves with lotion, normal urine  output.   Controlled gout: no recent episodes taking Allopurinol as prescribed   Patient Active Problem List   Diagnosis Date Noted  . Acute on chronic diastolic congestive heart failure ([place]) 05/21/2016  . Venous insufficiency 05/21/2016  . Hypertensive heart disease 05/21/2016  . Anemia in chronic kidney disease 12/22/2015  . Pulmonary hypertension 12/18/2015  . 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 Anxiety 04/12/2015  . Chronic kidney disease (CKD), stage III (moderate) 04/12/2015  . Edema extremities 04/12/2015  . 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 Asthma, moderate persistent 04/12/2015  . Super obesity ([place]) 04/12/2015  . Obstructive apnea 04/12/2015  . Restless leg 04/12/2015  . Allergic rhinitis 04/12/2015  . Vitamin D deficiency 04/12/2015  . Controlled gout 04/12/2015  . Premature atrial contractions 11/29/2013  . Benign essential hypertension   . Hyperlipidemia     Past Surgical History:  Procedure Laterality Date  . CARDIAC CATHETERIZATION  2002   [facility]  . PERICARDIUM SURGERY      Family History  Problem Relation Age of Onset  . Heart attack Mother   . Hypertension Mother     Social History   Social History  . Marital status: Married    Spouse name: N/A  . Number of children: N/A  . Years of education: N/A   Occupational History  . retired  Social History Main Topics  . Smoking status: Never Smoker  . Smokeless tobacco: Never Used  . Alcohol use No  . Drug use: No  . Sexual activity: Not Currently   Other Topics Concern  . Not on file   Social History Narrative  . No narrative on file     Current Outpatient Prescriptions:  .  albuterol (PROVENTIL HFA;VENTOLIN HFA) 108 (90 BASE) MCG/ACT inhaler, Inhale 2 puffs into the lungs every 6 (six) hours as needed for wheezing or shortness of breath., Disp: , Rfl:  .  allopurinol (ZYLOPRIM) 100 MG tablet, Take 1 tablet (100 mg total) by mouth 2 (two) times daily., Disp: 180 tablet, Rfl: 1 .   aspirin EC 81 MG tablet, Take 81 mg by mouth daily., Disp: , Rfl:  .  budesonide (PULMICORT) 0.25 MG/2ML nebulizer solution, Take 2 mLs (0.25 mg total) by nebulization 2 (two) times daily., Disp: 120 mL, Rfl: 2 .  Cholecalciferol (VITAMIN D) 2000 UNITS tablet, Take 2,000 Units by mouth daily., Disp: , Rfl:  .  COLCRYS 0.6 MG tablet, TAKE ONE TABLET BY MOUTH TWICE DAILY AS NEEDED FOR GOUT, Disp: 30 tablet, Rfl: 5 .  Dulaglutide (TRULICITY) 1.5 0000000 SOPN, Inject 1.5 mg into the skin once a week., Disp: 2 mL, Rfl: 0 .  Elastic Bandages & Supports (MEDICAL COMPRESSION STOCKINGS) MISC, 2 each by Does not apply route daily., Disp: 2 each, Rfl: 2 .  fluticasone (FLONASE) 50 MCG/ACT nasal spray, Place 2 sprays into both nostrils daily., Disp: 48 g, Rfl: 1 .  furosemide (LASIX) 80 MG tablet, Take 0.5 tablets (40 mg total) by mouth 2 (two) times daily., Disp: 90 tablet, Rfl: 0 .  Insulin Lispro Prot & Lispro (HUMALOG MIX 75/25 KWIKPEN) (75-25) 100 UNIT/ML Kwikpen, Inject 40-60 Units into the skin 2 (two) times daily. (Patient taking differently: Inject 40-60 Units into the skin 2 (two) times daily. 60 units am, 40 units pm), Disp: 15 mL, Rfl: 2 .  Insulin Pen Needle (NOVOFINE) 30G X 8 MM MISC, Inject 10 each into the skin daily., Disp: 100 each, Rfl: 1 .  irbesartan (AVAPRO) 300 MG tablet, Take 1 tablet (300 mg total) by mouth daily., Disp: 90 tablet, Rfl: 1 .  metoprolol (TOPROL-XL) 200 MG 24 hr tablet, Take 0.5-1 tablets (100-200 mg total) by mouth daily. Take with or immediately following a meal. In place of Cartia, Disp: 30 tablet, Rfl: 0 .  pravastatin (PRAVACHOL) 80 MG tablet, Take 1 tablet (80 mg total) by mouth every evening., Disp: 90 tablet, Rfl: 1  No Known Allergies   ROS  Constitutional: Negative for fever, positive for  weight change.  Respiratory: positive  For mild  cough and shortness of breath with activity   Cardiovascular: Negative for chest pain or palpitations.   Gastrointestinal: Negative for abdominal pain, no bowel changes.  Musculoskeletal: Negative for gait problem or joint swelling.  Skin: Negative for rash.  Neurological: Negative for dizziness or headache.  No other specific complaints in a complete review of systems (except as listed in HPI above).  Objective  Vitals:   08/25/16 0810  BP: (!) 150/78  Pulse: 91  Resp: 18  Temp: 98.1 F (36.7 C)  SpO2: 95%  Weight: (!) 395 lb 4 oz (179.3 kg)  Height: 5\' 10"  (1.778 m)    Body mass index is 56.71 kg/m.  Physical Exam  Constitutional: Patient appears well-developed Obese  No distress.  HEENT: head atraumatic, normocephalic, pupils equal and reactive to light,  neck supple, throat within normal limits Cardiovascular: Normal rate, regular rhythm and normal heart sounds.  No murmur heard. Trace BLE edema. Pulmonary/Chest: Effort normal and breath sounds normal. No respiratory distress. Abdominal: Soft.  There is no tenderness. Psychiatric: Patient has a normal mood and affect. behavior is normal. Judgment and thought content normal.   PHQ2/9: Depression screen [facility] 2/9 08/25/2016 06/04/2016 04/20/2016 01/20/2016 12/18/2015  Decreased Interest 0 0 0 0 0  Down, Depressed, Hopeless 0 0 0 0 0  PHQ - 2 Score 0 0 0 0 0     Fall Risk: Fall Risk  08/25/2016 06/04/2016 04/20/2016 01/20/2016 12/18/2015  Falls in the past year? No No No No No     Functional Status Survey: Is the patient deaf or have difficulty hearing?: No Does the patient have difficulty seeing, even when wearing glasses/contacts?: No Does the patient have difficulty concentrating, remembering, or making decisions?: No Does the patient have difficulty walking or climbing stairs?: Yes Does the patient have difficulty dressing or bathing?: No Does the patient have difficulty doing errands alone such as visiting a doctor's office or shopping?: No   Assessment & Plan  1. Hypertensive heart disease with heart failure  (HCC)  - metoprolol (TOPROL-XL) 200 MG 24 hr tablet; Take 0.5-1 tablets (100-200 mg total) by mouth daily. Take with or immediately following a meal. In place of Cartia  Dispense: 30 tablet; Refill: 0  2. Uncontrolled type 2 diabetes mellitus with stage 3 chronic kidney disease, with long-term current use of insulin (HCC)  We will try weaning down insulin once we add Trulicity, discussed possible side effects, she will check cost to see if she can afford both medications - POCT UA - Microalbumin - POCT glycosylated hemoglobin (Hb A1C) - Dulaglutide (TRULICITY) 1.5 0000000 SOPN; Inject 1.5 mg into the skin once a week.  Dispense: 2 mL; Refill: 0  3. Controlled gout  - allopurinol (ZYLOPRIM) 100 MG tablet; Take 1 tablet (100 mg total) by mouth 2 (two) times daily.  Dispense: 180 tablet; Refill: 1  4. Obstructive apnea  Continue CPAP every night  5. Mixed hyperlipidemia  - pravastatin (PRAVACHOL) 80 MG tablet; Take 1 tablet (80 mg total) by mouth every evening.  Dispense: 90 tablet; Refill: 1  6. PAC  On Cartia, but because of CHF we will try switching to Toprol XL and stop Cardizem - metoprolol (TOPROL-XL) 200 MG 24 hr tablet; Take 0.5-1 tablets (100-200 mg total) by mouth daily. Take with or immediately following a meal. In place of Cartia  Dispense: 30 tablet; Refill: 0  7. Morbid obesity, unspecified obesity type ([place])  We will add Trulicity  8. Essential hypertension, benign  bp is elevated, but because at home it is at goal and she gets dizzy on higher doses of bp medication we will start Toprol at 100 mg and go up as necessary  - irbesartan (AVAPRO) 300 MG tablet; Take 1 tablet (300 mg total) by mouth daily.  Dispense: 90 tablet; Refill: 1 - metoprolol (TOPROL-XL) 200 MG 24 hr tablet; Take 0.5-1 tablets (100-200 mg total) by mouth daily. Take with or immediately following a meal. In place of Cartia  Dispense: 30 tablet; Refill: 0

## 2016-09-07 DIAGNOSIS — J45909 Unspecified asthma, uncomplicated: Secondary | ICD-10-CM | POA: Diagnosis not present

## 2016-09-15 DIAGNOSIS — D631 Anemia in chronic kidney disease: Secondary | ICD-10-CM | POA: Diagnosis not present

## 2016-09-15 DIAGNOSIS — I129 Hypertensive chronic kidney disease with stage 1 through stage 4 chronic kidney disease, or unspecified chronic kidney disease: Secondary | ICD-10-CM | POA: Diagnosis not present

## 2016-09-15 DIAGNOSIS — I110 Hypertensive heart disease with heart failure: Secondary | ICD-10-CM | POA: Diagnosis not present

## 2016-09-15 DIAGNOSIS — G4733 Obstructive sleep apnea (adult) (pediatric): Secondary | ICD-10-CM | POA: Diagnosis not present

## 2016-09-15 DIAGNOSIS — N183 Chronic kidney disease, stage 3 (moderate): Secondary | ICD-10-CM | POA: Diagnosis not present

## 2016-09-15 DIAGNOSIS — I5033 Acute on chronic diastolic (congestive) heart failure: Secondary | ICD-10-CM | POA: Diagnosis not present

## 2016-09-20 DIAGNOSIS — J45909 Unspecified asthma, uncomplicated: Secondary | ICD-10-CM | POA: Diagnosis not present

## 2016-09-28 ENCOUNTER — Ambulatory Visit (INDEPENDENT_AMBULATORY_CARE_PROVIDER_SITE_OTHER): Payer: Medicare PPO | Admitting: Family Medicine

## 2016-09-28 ENCOUNTER — Encounter: Payer: Self-pay | Admitting: Family Medicine

## 2016-09-28 VITALS — BP 158/76 | HR 80 | Temp 98.5°F | Resp 18 | Ht 70.0 in | Wt 382.0 lb

## 2016-09-28 DIAGNOSIS — E1165 Type 2 diabetes mellitus with hyperglycemia: Secondary | ICD-10-CM

## 2016-09-28 DIAGNOSIS — Z794 Long term (current) use of insulin: Secondary | ICD-10-CM | POA: Diagnosis not present

## 2016-09-28 DIAGNOSIS — E1122 Type 2 diabetes mellitus with diabetic chronic kidney disease: Secondary | ICD-10-CM

## 2016-09-28 DIAGNOSIS — IMO0002 Reserved for concepts with insufficient information to code with codable children: Secondary | ICD-10-CM

## 2016-09-28 DIAGNOSIS — I110 Hypertensive heart disease with heart failure: Secondary | ICD-10-CM | POA: Diagnosis not present

## 2016-09-28 DIAGNOSIS — N183 Chronic kidney disease, stage 3 (moderate): Secondary | ICD-10-CM

## 2016-09-28 MED ORDER — AMLODIPINE BESYLATE 2.5 MG PO TABS: 2.5000 mg | ORAL_TABLET | Freq: Every day | ORAL | 1 refills | Status: DC

## 2016-09-28 MED ORDER — DULAGLUTIDE 1.5 MG/0.5ML ~~LOC~~ SOAJ: 1.5000 mg | SUBCUTANEOUS | 2 refills | Status: DC

## 2016-09-28 NOTE — Progress Notes (Signed)
Name: Amber Reyes   MRN: [id number]    DOB: 18-Jun-1946   Date:09/28/2016       Progress Note  Subjective  Chief Complaint  Chief Complaint  Patient presents with  . Follow-up    patient is here for her 1 month f/u  . Hypertension    patient stated that the new BP meds are not working. Her Nephrologists agreed. She checks her BP BID and brought her log. patient has not had any headaches or dizziness but doesn't feel right on the new meds  . Diabetes    patient stated that her blood sugar has been doing well. she checks it BID and has her log with her. it was 98 this morning. patient stated that the highest has been 133 but it has been running low.    HPI  HTN: we switched her from Cardizem to Toprol XL on her last visit, however bp has not been to goal. She is willing to add another medication. BP at home has been elevated SBP in the 140's-150's. No dizziness or chest pain. Nephrologist is seeing Dr. Alfonse Alpers   DM II: we added Trulicity on her last visit ( 08-2016) and she has lost 13 lbs since started the new medication. She has noticed that she is not as hungry, she is also eating healthier, no nausea or vomiting. She states the medication is very expensive but her daughter helped her pay for it. Glucose has been dropping since she started on trulicity. It went down to 68 in the am once. Glucose has been in the low 100's. [id number] is the range.    Patient Active Problem List   Diagnosis Date Noted  . Acute on chronic diastolic congestive heart failure ([place]) 05/21/2016  . Venous insufficiency 05/21/2016  . Hypertensive heart disease 05/21/2016  . Anemia in chronic kidney disease 12/22/2015  . Pulmonary hypertension 12/18/2015  . 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 Anxiety 04/12/2015  . Chronic kidney disease (CKD), stage III (moderate) 04/12/2015  . Edema extremities 04/12/2015  . 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 Asthma, moderate persistent 04/12/2015  . Super obesity ([place])  04/12/2015  . Obstructive apnea 04/12/2015  . Restless leg 04/12/2015  . Allergic rhinitis 04/12/2015  . Vitamin D deficiency 04/12/2015  . Controlled gout 04/12/2015  . Premature atrial contractions 11/29/2013  . Benign essential hypertension   . Hyperlipidemia     Past Surgical History:  Procedure Laterality Date  . CARDIAC CATHETERIZATION  2002   [facility]  . PERICARDIUM SURGERY      Family History  Problem Relation Age of Onset  . Heart attack Mother   . Hypertension Mother     Social History   Social History  . Marital status: Married    Spouse name: N/A  . Number of children: N/A  . Years of education: N/A   Occupational History  . retired    Social History Main Topics  . Smoking status: Never Smoker  . Smokeless tobacco: Never Used  . Alcohol use No  . Drug use: No  . Sexual activity: Not Currently   Other Topics Concern  . Not on file   Social History Narrative  . No narrative on file     Current Outpatient Prescriptions:  .  albuterol (PROVENTIL HFA;VENTOLIN HFA) 108 (90 BASE) MCG/ACT inhaler, Inhale 2 puffs into the lungs every 6 (six) hours as needed for wheezing or shortness of breath., Disp: , Rfl:  .  allopurinol (ZYLOPRIM) 100 MG tablet,  Take 1 tablet (100 mg total) by mouth 2 (two) times daily., Disp: 180 tablet, Rfl: 1 .  aspirin EC 81 MG tablet, Take 81 mg by mouth daily., Disp: , Rfl:  .  budesonide (PULMICORT) 0.25 MG/2ML nebulizer solution, Take 2 mLs (0.25 mg total) by nebulization 2 (two) times daily., Disp: 120 mL, Rfl: 2 .  Cholecalciferol (VITAMIN D) 2000 UNITS tablet, Take 2,000 Units by mouth daily., Disp: , Rfl:  .  COLCRYS 0.6 MG tablet, TAKE ONE TABLET BY MOUTH TWICE DAILY AS NEEDED FOR GOUT, Disp: 30 tablet, Rfl: 5 .  Dulaglutide (TRULICITY) 1.5 0000000 SOPN, Inject 1.5 mg into the skin once a week., Disp: 2 mL, Rfl: 2 .  Elastic Bandages & Supports (MEDICAL COMPRESSION STOCKINGS) MISC, 2 each by Does not apply route daily., Disp:  2 each, Rfl: 2 .  fluticasone (FLONASE) 50 MCG/ACT nasal spray, Place 2 sprays into both nostrils daily., Disp: 48 g, Rfl: 1 .  furosemide (LASIX) 40 MG tablet, Take 1 tablet (40 mg total) by mouth 2 (two) times daily as needed., Disp: 180 tablet, Rfl: 1 .  Insulin Lispro Prot & Lispro (HUMALOG MIX 75/25 KWIKPEN) (75-25) 100 UNIT/ML Kwikpen, Inject 40-60 Units into the skin 2 (two) times daily. (Patient taking differently: Inject 40-60 Units into the skin 2 (two) times daily. 60 units am, 40 units pm), Disp: 15 mL, Rfl: 2 .  Insulin Pen Needle (NOVOFINE) 30G X 8 MM MISC, Inject 10 each into the skin daily., Disp: 100 each, Rfl: 1 .  irbesartan (AVAPRO) 300 MG tablet, Take 1 tablet (300 mg total) by mouth daily., Disp: 90 tablet, Rfl: 1 .  metoprolol (TOPROL-XL) 200 MG 24 hr tablet, Take 0.5-1 tablets (100-200 mg total) by mouth daily. Take with or immediately following a meal. In place of Cartia, Disp: 30 tablet, Rfl: 0 .  pravastatin (PRAVACHOL) 80 MG tablet, Take 1 tablet (80 mg total) by mouth every evening., Disp: 90 tablet, Rfl: 1 .  amLODipine (NORVASC) 2.5 MG tablet, Take 1 tablet (2.5 mg total) by mouth daily., Disp: 30 tablet, Rfl: 1  No Known Allergies   ROS  Ten systems reviewed and is negative except as mentioned in HPI   Objective  Vitals:   09/28/16 0753  BP: (!) 158/76  Pulse: 80  Resp: 18  Temp: 98.5 F (36.9 C)  TempSrc: Oral  SpO2: 96%  Weight: (!) 382 lb (173.3 kg)  Height: 5\' 10"  (1.778 m)    Body mass index is 54.81 kg/m.  Physical Exam  Constitutional: Patient appears well-developed and well-nourished. Obese  No distress.  HEENT: head atraumatic, normocephalic, pupils equal and reactive to light, neck supple, throat within normal limits Cardiovascular: Normal rate, regular rhythm and normal heart sounds.  No murmur heard. 1 plus  BLE edema. Pulmonary/Chest: Effort normal and breath sounds normal. No respiratory distress. Abdominal: Soft.  There is no  tenderness. Psychiatric: Patient has a normal mood and affect. behavior is normal. Judgment and thought content normal.  Recent Results (from the past 2160 hour(s))  POCT glycosylated hemoglobin (Hb A1C)     Status: Abnormal   Collection Time: 08/25/16  8:34 AM  Result Value Ref Range   Hemoglobin A1C 7.0      PHQ2/9: Depression screen [facility] 2/9 09/28/2016 08/25/2016 06/04/2016 04/20/2016 01/20/2016  Decreased Interest 0 0 0 0 0  Down, Depressed, Hopeless 0 0 0 0 0  PHQ - 2 Score 0 0 0 0 0     Fall  Risk: Fall Risk  09/28/2016 08/25/2016 06/04/2016 04/20/2016 01/20/2016  Falls in the past year? No No No No No     Functional Status Survey: Is the patient deaf or have difficulty hearing?: No Does the patient have difficulty seeing, even when wearing glasses/contacts?: No Does the patient have difficulty concentrating, remembering, or making decisions?: No Does the patient have difficulty walking or climbing stairs?: Yes Does the patient have difficulty dressing or bathing?: No Does the patient have difficulty doing errands alone such as visiting a doctor's office or shopping?: No    Assessment & Plan  1. Hypertensive heart disease with heart failure (HCC)  - amLODipine (NORVASC) 2.5 MG tablet; Take 1 tablet (2.5 mg total) by mouth daily.  Dispense: 30 tablet; Refill: 1  2. Uncontrolled type 2 diabetes mellitus with stage 3 chronic kidney disease, with long-term current use of insulin (HCC)  - Dulaglutide (TRULICITY) 1.5 0000000 SOPN; Inject 1.5 mg into the skin once a week.  Dispense: 2 mL; Refill: 2  Advised to go down on Humalog mix at night from 40 units to 38 and monitor. Keep glucose between 100-120

## 2016-09-28 NOTE — Patient Instructions (Signed)
You can go down on night dose of insulin to 38 and if glucose in the mornings keeps going below 100, go down to 36 units

## 2016-09-30 ENCOUNTER — Telehealth: Payer: Self-pay

## 2016-09-30 NOTE — Telephone Encounter (Signed)
She needs to go back to half pill of metoprolol , recheck it today and tomorrow and let us know what her bp and hr is

## 2016-09-30 NOTE — Telephone Encounter (Signed)
Patient notified and will call us tomorrow with information.

## 2016-09-30 NOTE — Telephone Encounter (Signed)
Patient states on you last visit you added Amlodipine 2.5 mg due to BP still being elevated along with Metoprolol. Patient wants to know do you want her to take a whole or half a pill of Metoprolol. She was only taking 1/2 of it before hand, but now with both medications her BP is running 98/56 with heart rate of 57. Please advise.

## 2016-10-01 ENCOUNTER — Ambulatory Visit (INDEPENDENT_AMBULATORY_CARE_PROVIDER_SITE_OTHER): Payer: Medicare PPO | Admitting: Family Medicine

## 2016-10-01 ENCOUNTER — Other Ambulatory Visit: Payer: Self-pay | Admitting: Family Medicine

## 2016-10-01 VITALS — BP 158/78 | HR 63

## 2016-10-01 DIAGNOSIS — R5383 Other fatigue: Secondary | ICD-10-CM

## 2016-10-01 DIAGNOSIS — H538 Other visual disturbances: Secondary | ICD-10-CM

## 2016-10-01 LAB — GLUCOSE, POCT (MANUAL RESULT ENTRY): POC GLUCOSE: 117 mg/dL — AB (ref 70–99)

## 2016-10-01 MED ORDER — DILTIAZEM HCL ER COATED BEADS 240 MG PO CP24: 240.0000 mg | ORAL_CAPSULE | Freq: Every day | ORAL | 2 refills | Status: DC

## 2016-10-01 NOTE — Progress Notes (Signed)
Patient came in for a Nurse visit BP Check and states since adding amLODipine (NORVASC) 2.5 MG tablet and Toprol XL. She has felt worst she is having blurry vision, sleepy and very tired. Blood sugar has been much better since starting the Trulicity, average BS 123XX123. Dr. Ancil Boozer advised patient to go back to Cardizem, and if symptoms persist to go to the ER. Also advised patient to come back in 1 week to recheck BP.

## 2016-10-01 NOTE — Progress Notes (Signed)
Name: Amber Reyes   MRN: [id number]    DOB: Mar 24, 1946   Date:10/01/2016       Progress Note  Subjective  Chief Complaint  Chief Complaint  Patient presents with  . Blood Pressure Check    HPI  Patient came in for a Nurse visit BP Check and states since adding amLODipine (NORVASC) 2.5 MG tablet and Toprol XL. She has felt worst she is having blurry vision, sleepy and very tired. Blood sugar has been much better since starting the Trulicity, average BS 123XX123. Dr. Ancil Boozer advised patient to go back to Cardizem, and if symptoms persist to go to the ER. Also advised patient to come back in 1 week to recheck BP.    Patient Active Problem List   Diagnosis Date Noted  . Acute on chronic diastolic congestive heart failure ([place]) 05/21/2016  . Venous insufficiency 05/21/2016  . Hypertensive heart disease 05/21/2016  . Anemia in chronic kidney disease 12/22/2015  . Pulmonary hypertension 12/18/2015  . 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 Anxiety 04/12/2015  . Chronic kidney disease (CKD), stage III (moderate) 04/12/2015  . Edema extremities 04/12/2015  . 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 Asthma, moderate persistent 04/12/2015  . Super obesity ([place]) 04/12/2015  . Obstructive apnea 04/12/2015  . Restless leg 04/12/2015  . Allergic rhinitis 04/12/2015  . Vitamin D deficiency 04/12/2015  . Controlled gout 04/12/2015  . Premature atrial contractions 11/29/2013  . Benign essential hypertension   . Hyperlipidemia     Social History  Substance Use Topics  . Smoking status: Never Smoker  . Smokeless tobacco: Never Used  . Alcohol use No     Current Outpatient Prescriptions:  .  albuterol (PROVENTIL HFA;VENTOLIN HFA) 108 (90 BASE) MCG/ACT inhaler, Inhale 2 puffs into the lungs every 6 (six) hours as needed for wheezing or shortness of breath., Disp: , Rfl:  .  allopurinol (ZYLOPRIM) 100 MG tablet, Take 1 tablet (100 mg total) by mouth 2 (two) times daily., Disp: 180 tablet, Rfl: 1 .   aspirin EC 81 MG tablet, Take 81 mg by mouth daily., Disp: , Rfl:  .  budesonide (PULMICORT) 0.25 MG/2ML nebulizer solution, Take 2 mLs (0.25 mg total) by nebulization 2 (two) times daily., Disp: 120 mL, Rfl: 2 .  Cholecalciferol (VITAMIN D) 2000 UNITS tablet, Take 2,000 Units by mouth daily., Disp: , Rfl:  .  COLCRYS 0.6 MG tablet, TAKE ONE TABLET BY MOUTH TWICE DAILY AS NEEDED FOR GOUT, Disp: 30 tablet, Rfl: 5 .  diltiazem (CARTIA XT) 240 MG 24 hr capsule, Take 1 capsule (240 mg total) by mouth daily., Disp: 30 capsule, Rfl: 2 .  Dulaglutide (TRULICITY) 1.5 0000000 SOPN, Inject 1.5 mg into the skin once a week., Disp: 2 mL, Rfl: 2 .  Elastic Bandages & Supports (MEDICAL COMPRESSION STOCKINGS) MISC, 2 each by Does not apply route daily., Disp: 2 each, Rfl: 2 .  fluticasone (FLONASE) 50 MCG/ACT nasal spray, Place 2 sprays into both nostrils daily., Disp: 48 g, Rfl: 1 .  furosemide (LASIX) 40 MG tablet, Take 1 tablet (40 mg total) by mouth 2 (two) times daily as needed., Disp: 180 tablet, Rfl: 1 .  Insulin Lispro Prot & Lispro (HUMALOG MIX 75/25 KWIKPEN) (75-25) 100 UNIT/ML Kwikpen, Inject 40-60 Units into the skin 2 (two) times daily. (Patient taking differently: Inject 40-60 Units into the skin 2 (two) times daily. 60 units am, 40 units pm), Disp: 15 mL, Rfl: 2 .  Insulin Pen Needle (NOVOFINE) 30G X  8 MM MISC, Inject 10 each into the skin daily., Disp: 100 each, Rfl: 1 .  irbesartan (AVAPRO) 300 MG tablet, Take 1 tablet (300 mg total) by mouth daily., Disp: 90 tablet, Rfl: 1 .  pravastatin (PRAVACHOL) 80 MG tablet, Take 1 tablet (80 mg total) by mouth every evening., Disp: 90 tablet, Rfl: 1  No Known Allergies  ROS  Not done  Objective  Vitals:   10/01/16 1239 10/01/16 1246  BP: (!) 164/78 (!) 158/78  Pulse: 63     There is no height or weight on file to calculate BMI.    Physical Exam  Obese in no acute distress  Recent Results (from the past 2160 hour(s))  POCT glycosylated  hemoglobin (Hb A1C)     Status: Abnormal   Collection Time: 08/25/16  8:34 AM  Result Value Ref Range   Hemoglobin A1C 7.0   POCT Glucose (CBG)     Status: Abnormal   Collection Time: 10/01/16 12:44 PM  Result Value Ref Range   POC Glucose 117 (A) 70 - 99 mg/dl     Assessment & Plan  1. Blurry vision, bilateral  - POCT Glucose (CBG)  2. Other fatigue  - POCT Glucose (CBG)

## 2016-10-08 ENCOUNTER — Ambulatory Visit (INDEPENDENT_AMBULATORY_CARE_PROVIDER_SITE_OTHER): Payer: Medicare PPO | Admitting: Family Medicine

## 2016-10-08 ENCOUNTER — Encounter: Payer: Self-pay | Admitting: Family Medicine

## 2016-10-08 VITALS — BP 130/70 | HR 88

## 2016-10-08 DIAGNOSIS — J45909 Unspecified asthma, uncomplicated: Secondary | ICD-10-CM | POA: Diagnosis not present

## 2016-10-08 DIAGNOSIS — I10 Essential (primary) hypertension: Secondary | ICD-10-CM

## 2016-10-08 NOTE — Progress Notes (Signed)
She is doing better since she resumed medication Continue current regiment

## 2016-10-20 ENCOUNTER — Ambulatory Visit
Admission: RE | Admit: 2016-10-20 | Discharge: 2016-10-20 | Disposition: A | Discharge status: Home / Self Care | Payer: Medicare PPO | Source: Ambulatory Visit | Attending: Family Medicine | Admitting: Family Medicine

## 2016-10-20 DIAGNOSIS — Z1231 Encounter for screening mammogram for malignant neoplasm of breast: Secondary | ICD-10-CM | POA: Diagnosis not present

## 2016-10-25 DIAGNOSIS — J45909 Unspecified asthma, uncomplicated: Secondary | ICD-10-CM | POA: Diagnosis not present

## 2016-11-03 DIAGNOSIS — E113393 Type 2 diabetes mellitus with moderate nonproliferative diabetic retinopathy without macular edema, bilateral: Secondary | ICD-10-CM | POA: Diagnosis not present

## 2016-11-05 DIAGNOSIS — J45909 Unspecified asthma, uncomplicated: Secondary | ICD-10-CM | POA: Diagnosis not present

## 2016-11-22 DIAGNOSIS — J45909 Unspecified asthma, uncomplicated: Secondary | ICD-10-CM | POA: Diagnosis not present

## 2016-11-24 DIAGNOSIS — D631 Anemia in chronic kidney disease: Secondary | ICD-10-CM | POA: Diagnosis not present

## 2016-11-24 DIAGNOSIS — N183 Chronic kidney disease, stage 3 (moderate): Secondary | ICD-10-CM | POA: Diagnosis not present

## 2016-11-24 DIAGNOSIS — I129 Hypertensive chronic kidney disease with stage 1 through stage 4 chronic kidney disease, or unspecified chronic kidney disease: Secondary | ICD-10-CM | POA: Diagnosis not present

## 2016-11-24 DIAGNOSIS — I110 Hypertensive heart disease with heart failure: Secondary | ICD-10-CM | POA: Diagnosis not present

## 2016-11-24 DIAGNOSIS — G4733 Obstructive sleep apnea (adult) (pediatric): Secondary | ICD-10-CM | POA: Diagnosis not present

## 2016-11-24 DIAGNOSIS — E559 Vitamin D deficiency, unspecified: Secondary | ICD-10-CM | POA: Diagnosis not present

## 2016-11-24 DIAGNOSIS — M109 Gout, unspecified: Secondary | ICD-10-CM | POA: Diagnosis not present

## 2016-11-24 DIAGNOSIS — E785 Hyperlipidemia, unspecified: Secondary | ICD-10-CM | POA: Diagnosis not present

## 2016-11-30 ENCOUNTER — Ambulatory Visit (INDEPENDENT_AMBULATORY_CARE_PROVIDER_SITE_OTHER): Payer: Medicare PPO | Admitting: Cardiovascular Disease

## 2016-11-30 ENCOUNTER — Encounter: Payer: Self-pay | Admitting: Cardiovascular Disease

## 2016-11-30 VITALS — BP 132/76 | HR 112 | Ht 69.0 in | Wt 383.0 lb

## 2016-11-30 DIAGNOSIS — G4733 Obstructive sleep apnea (adult) (pediatric): Secondary | ICD-10-CM

## 2016-11-30 DIAGNOSIS — I10 Essential (primary) hypertension: Secondary | ICD-10-CM | POA: Diagnosis not present

## 2016-11-30 DIAGNOSIS — I5032 Chronic diastolic (congestive) heart failure: Secondary | ICD-10-CM

## 2016-11-30 NOTE — Progress Notes (Signed)
Cardiology Office Note   Date:  11/30/2016   ID:  Amber Reyes, DOB Jul 28, 1946, MRN [id number]  PCP:  Loistine Chance, MD  Cardiologist:   Kathlyn Sacramento, MD   Chief Complaint  Patient presents with  . other    6 month follow up. Patient c/o SOB with excertion. Meds reviewed verbally with patient.       History of Present Illness: Amber Reyes is a 71 y.o. female who presents for a followup visit regarding chronic diastolic heart failure and PACs. 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  Echocardiogram in 04/2016 showed normal LV systolic function with an ejection fraction of 81%, grade 1 diastolic dysfunction and mildly dilated left atrium. She has been doing reasonably well with stable symptoms.She was switched from diltiazem to amlodipine and Toprol but felt much worse and was switched back to diltiazem. She denies any chest pain. Shortness of breath is stable. She takes furosemide 40 mg once daily and occasionally she takes a second dose in the afternoon if needed. Her weight has been stable.   Past Medical History:  Diagnosis Date  . Allergic rhinitis, cause unspecified   . Anxiety   . Chronic diastolic CHF (congestive heart failure) ([place])   . CKD (chronic kidney disease), stage III   . Edema   . Essential hypertension, benign   . Gout, unspecified   . Heart murmur    as child  . Hyperlipidemia   . Lipoma of unspecified site   . Other and unspecified disc disorder of lumbar region   . Other general symptoms(780.99)   . PAC (premature atrial contraction)   . Renal insufficiency   . Restless legs syndrome (RLS)   . Sebaceous cyst   . Super obesity ([place])   . Type II or unspecified type diabetes mellitus without mention of complication, uncontrolled   . Unspecified asthma(493.90)   .  Unspecified disease of pericardium   . Unspecified sleep apnea   . Unspecified vitamin D deficiency   . Vaginitis and vulvovaginitis, unspecified   . Venous insufficiency     Past Surgical History:  Procedure Laterality Date  . CARDIAC CATHETERIZATION  2002   [facility]  . PERICARDIUM SURGERY       Current Outpatient Prescriptions  Medication Sig Dispense Refill  . albuterol (PROVENTIL HFA;VENTOLIN HFA) 108 (90 BASE) MCG/ACT inhaler Inhale 2 puffs into the lungs every 6 (six) hours as needed for wheezing or shortness of breath.    . allopurinol (ZYLOPRIM) 100 MG tablet Take 1 tablet (100 mg total) by mouth 2 (two) times daily. 180 tablet 1  . aspirin EC 81 MG tablet Take 81 mg by mouth daily.    . budesonide (PULMICORT) 0.25 MG/2ML nebulizer solution Take 2 mLs (0.25 mg total) by nebulization 2 (two) times daily. 120 mL 2  . Cholecalciferol (VITAMIN D) 2000 UNITS tablet Take 2,000 Units by mouth daily.    Marland Kitchen COLCRYS 0.6 MG tablet TAKE ONE TABLET BY MOUTH TWICE DAILY AS NEEDED FOR GOUT 30 tablet 5  . diltiazem (CARTIA XT) 240 MG 24 hr capsule Take 1 capsule (240 mg total) by mouth daily. 30 capsule 2  . Dulaglutide (TRULICITY) 1.5 WE/9.9BZ SOPN Inject 1.5 mg into the skin once a week. 2 mL 2  . Elastic Bandages & Supports (MEDICAL COMPRESSION STOCKINGS) MISC 2 each by Does not apply  route daily. 2 each 2  . fluticasone (FLONASE) 50 MCG/ACT nasal spray Place 2 sprays into both nostrils daily. 48 g 1  . furosemide (LASIX) 40 MG tablet Take 1 tablet (40 mg total) by mouth 2 (two) times daily as needed. 180 tablet 1  . Insulin Lispro Prot & Lispro (HUMALOG MIX 75/25 KWIKPEN) (75-25) 100 UNIT/ML Kwikpen Inject 40-60 Units into the skin 2 (two) times daily. (Patient taking differently: Inject 40-60 Units into the skin 2 (two) times daily. 60 units am, 40 units pm) 15 mL 2  . Insulin Pen Needle (NOVOFINE) 30G X 8 MM MISC Inject 10 each into the skin daily. 100 each 1  . irbesartan (AVAPRO) 300 MG  tablet Take 1 tablet (300 mg total) by mouth daily. 90 tablet 1  . pravastatin (PRAVACHOL) 80 MG tablet Take 1 tablet (80 mg total) by mouth every evening. 90 tablet 1   No current facility-administered medications for this visit.     Allergies:   Patient has no known allergies.    Social History:  The patient  reports that she has never smoked. She has never used smokeless tobacco. She reports that she does not drink alcohol or use drugs.   Family History:  The patient's family history includes Heart attack in her mother; Hypertension in her mother.    ROS:  Please see the history of present illness.   Otherwise, review of systems are positive for none.   All other systems are reviewed and negative.    PHYSICAL EXAM: VS:  BP 132/76 (BP Location: Left Arm, Patient Position: Sitting, Cuff Size: Normal)   Pulse (!) 112   Ht 5\' 9"  (1.753 m)   Wt (!) 383 lb (173.7 kg)   BMI 56.56 kg/m  , BMI Body mass index is 56.56 kg/m. GEN: Well nourished, well developed, in no acute distress  HEENT: normal  Neck: no JVD, carotid bruits, or masses Cardiac: Mildly tachycardic with premature beats; no murmurs, rubs, or gallops,no edema  Respiratory:  clear to auscultation bilaterally, normal work of breathing GI: soft, nontender, nondistended, + BS MS: no deformity or atrophy  Skin: warm and dry, no rash Neuro:  Strength and sensation are intact Psych: euthymic mood, full affect   EKG:  EKG is ordered today. The ekg ordered today demonstrates sinus tachycardia with PVCs. Inferior T wave changes suggestive of ischemia.  Recent Labs: 04/20/2016: ALT 7 05/20/2016: B Natriuretic Peptide 198.0 05/21/2016: Hemoglobin 11.3; Platelets 223 05/22/2016: BUN 39; Creatinine, Ser 1.98; Potassium 4.4; Sodium 136    Lipid Panel    Component Value Date/Time   CHOL 143 04/20/2016 0907   CHOL 139 04/16/2015 1138   TRIG 86 04/20/2016 0907   HDL 52 04/20/2016 0907   HDL 52 04/16/2015 1138   CHOLHDL 2.8  04/20/2016 0907   VLDL 17 04/20/2016 0907   LDLCALC 74 04/20/2016 0907   LDLCALC 70 04/16/2015 1138      Wt Readings from Last 3 Encounters:  11/30/16 (!) 383 lb (173.7 kg)  09/28/16 (!) 382 lb (173.3 kg)  08/25/16 (!) 395 lb 4 oz (179.3 kg)       No flowsheet data found.    ASSESSMENT AND PLAN:  1.   Chronic diastolic heart failure: She appears to be euvolemic on current dose of furosemide .    2. PACs: She is actually noted to have PVCs today on EKG and she is mildly tachycardic. The dose of diltiazem was decreased from 300-240 mg once daily  due to dizziness. Consider increasing the dose again if needed.  3. Essential hypertension: Blood pressure is controlled on current medications.  4. Morbid obesity: I again had a prolonged discussion with her about finding ways to be more active and attempting weight loss. I suggested water aerobics and she is going to check into this. She was very short of breath just walking from parking lot to our clinic.  5. Obstructive sleep apnea: Continue CPAP.   Disposition:   FU with me in 6 months  Signed,  Kathlyn Sacramento, MD  11/30/2016 1:45 PM    [facility]

## 2016-11-30 NOTE — Patient Instructions (Signed)
Medication Instructions: Continue same medications.   Labwork: None.   Procedures/Testing: None.   Follow-Up: 6 months with Dr. Kailei Cowens.   Any Additional Special Instructions Will Be Listed Below (If Applicable).     If you need a refill on your cardiac medications before your next appointment, please call your pharmacy.   

## 2016-12-06 DIAGNOSIS — J45909 Unspecified asthma, uncomplicated: Secondary | ICD-10-CM | POA: Diagnosis not present

## 2016-12-09 ENCOUNTER — Telehealth: Payer: Self-pay | Admitting: Family Medicine

## 2016-12-09 ENCOUNTER — Encounter: Payer: Self-pay | Admitting: Family Medicine

## 2016-12-09 ENCOUNTER — Ambulatory Visit (INDEPENDENT_AMBULATORY_CARE_PROVIDER_SITE_OTHER): Payer: Medicare PPO | Admitting: Family Medicine

## 2016-12-09 VITALS — BP 138/74 | HR 102 | Temp 99.0°F | Resp 18 | Ht 69.0 in | Wt 380.3 lb

## 2016-12-09 DIAGNOSIS — J209 Acute bronchitis, unspecified: Secondary | ICD-10-CM | POA: Diagnosis not present

## 2016-12-09 DIAGNOSIS — J4540 Moderate persistent asthma, uncomplicated: Secondary | ICD-10-CM

## 2016-12-09 MED ORDER — HYDROCOD POLST-CPM POLST ER 10-8 MG/5ML PO SUER: 5.0000 mL | Freq: Two times a day (BID) | ORAL | 0 refills | Status: DC | PRN

## 2016-12-09 MED ORDER — AZITHROMYCIN 250 MG PO TABS: ORAL_TABLET | ORAL | 0 refills | Status: DC

## 2016-12-09 NOTE — Progress Notes (Addendum)
Name: Amber Reyes   MRN: [id number]    DOB: 07-25-1946   Date:12/09/2016       Progress Note  Subjective  Chief Complaint  Chief Complaint  Patient presents with  . URI    cough, congested, white mucous, for 5 days    HPI  Pt has 5 day history of new onset cough, chest congestion, nasal congestion, decreased appetite, subjective fevers, nausea, and headache.  Denies sore throat, chills, vomiting or diarrhea. Pt's husband is recent sick contact. Pt has been using albuterol inhaler with some relief during coughing episodes.   Patient Active Problem List   Diagnosis Date Noted  . Acute on chronic diastolic congestive heart failure ([place]) 05/21/2016  . Venous insufficiency 05/21/2016  . Hypertensive heart disease 05/21/2016  . Anemia in chronic kidney disease 12/22/2015  . Pulmonary hypertension ([place]) 12/18/2015  . 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 Anxiety 04/12/2015  . Chronic kidney disease (CKD), stage III (moderate) 04/12/2015  . Edema extremities 04/12/2015  . 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 Asthma, moderate persistent 04/12/2015  . Super obesity 04/12/2015  . Obstructive apnea 04/12/2015  . Restless leg 04/12/2015  . Allergic rhinitis 04/12/2015  . Vitamin D deficiency 04/12/2015  . Controlled gout 04/12/2015  . Premature atrial contractions 11/29/2013  . Benign essential hypertension   . Hyperlipidemia     Social History  Substance Use Topics  . Smoking status: Never Smoker  . Smokeless tobacco: Never Used  . Alcohol use No     Current Outpatient Prescriptions:  .  albuterol (PROVENTIL HFA;VENTOLIN HFA) 108 (90 BASE) MCG/ACT inhaler, Inhale 2 puffs into the lungs every 6 (six) hours as needed for wheezing or shortness of breath., Disp: , Rfl:  .  allopurinol (ZYLOPRIM) 100 MG tablet, Take 1 tablet (100 mg total) by mouth 2 (two) times daily., Disp: 180 tablet, Rfl: 1 .  aspirin EC 81 MG tablet, Take 81 mg by mouth daily., Disp: , Rfl:  .  budesonide  (PULMICORT) 0.25 MG/2ML nebulizer solution, Take 2 mLs (0.25 mg total) by nebulization 2 (two) times daily., Disp: 120 mL, Rfl: 2 .  Cholecalciferol (VITAMIN D) 2000 UNITS tablet, Take 2,000 Units by mouth daily., Disp: , Rfl:  .  COLCRYS 0.6 MG tablet, TAKE ONE TABLET BY MOUTH TWICE DAILY AS NEEDED FOR GOUT, Disp: 30 tablet, Rfl: 5 .  diltiazem (CARTIA XT) 240 MG 24 hr capsule, Take 1 capsule (240 mg total) by mouth daily., Disp: 30 capsule, Rfl: 2 .  Dulaglutide (TRULICITY) 1.5 HC/6.2BJ SOPN, Inject 1.5 mg into the skin once a week., Disp: 2 mL, Rfl: 2 .  Elastic Bandages & Supports (MEDICAL COMPRESSION STOCKINGS) MISC, 2 each by Does not apply route daily., Disp: 2 each, Rfl: 2 .  fluticasone (FLONASE) 50 MCG/ACT nasal spray, Place 2 sprays into both nostrils daily., Disp: 48 g, Rfl: 1 .  furosemide (LASIX) 40 MG tablet, Take 1 tablet (40 mg total) by mouth 2 (two) times daily as needed., Disp: 180 tablet, Rfl: 1 .  Insulin Lispro Prot & Lispro (HUMALOG MIX 75/25 KWIKPEN) (75-25) 100 UNIT/ML Kwikpen, Inject 40-60 Units into the skin 2 (two) times daily. (Patient taking differently: Inject 40-60 Units into the skin 2 (two) times daily. 60 units am, 40 units pm), Disp: 15 mL, Rfl: 2 .  Insulin Pen Needle (NOVOFINE) 30G X 8 MM MISC, Inject 10 each into the skin daily., Disp: 100 each, Rfl: 1 .  irbesartan (AVAPRO) 300 MG tablet, Take 1  tablet (300 mg total) by mouth daily., Disp: 90 tablet, Rfl: 1 .  pravastatin (PRAVACHOL) 80 MG tablet, Take 1 tablet (80 mg total) by mouth every evening., Disp: 90 tablet, Rfl: 1  No Known Allergies  ROS  Constitutional: Negative for fever or weight change.  HEENT: See HPI Respiratory: Positive for cough; endorses shortness of breath, but states no more than usual.   Cardiovascular: Negative for chest pain or palpitations.  Gastrointestinal: Negative for abdominal pain, no bowel changes.  Musculoskeletal: Negative for gait problem or joint swelling. Endorses  generalized body aches. Skin: Negative for rash.  Neurological: Negative for dizziness or headache. No confusion No other specific complaints in a complete review of systems (except as listed in HPI above).  Objective  Vitals:   12/09/16 1138  BP: 138/74  Pulse: (!) 102  Resp: 18  Temp: 99 F (37.2 C)  TempSrc: Oral  SpO2: 97%  Weight: (!) 380 lb 4.8 oz (172.5 kg)  Height: 5\' 9"  (1.753 m)    Body mass index is 56.16 kg/m.  Nursing Note and Vital Signs reviewed.  Physical Exam  Constitutional: Patient appears well-developed and well-nourished. Morbidly obese, No distress.  HEENT: head atraumatic, normocephalic, pupils equal and reactive to light, EOM's intact, TM's without erythema or bulging, no maxillary or frontal sinus pain on palpation, neck supple without lymphadenopathy, oropharynx erythematous and moist without exudate Cardiovascular: Normal rate, regular rhythm, S1/S2 present.  No murmur or rub heard. No BLE edema. Pulmonary/Chest: Effort normal; rhonchi heard intermittently in bilateral upper lobes. No respiratory distress or retractions. Pt coughing intermittently during visit. Psychiatric: Patient has a normal mood and affect. behavior is normal. Judgment and thought content normal.  Recent Results (from the past 2160 hour(s))  POCT Glucose (CBG)     Status: Abnormal   Collection Time: 10/01/16 12:44 PM  Result Value Ref Range   POC Glucose 117 (A) 70 - 99 mg/dl   Assessment & Plan 1. Acute bronchitis with bronchospasm - azithromycin (ZITHROMAX) 250 MG tablet; Take 2 pills on day 1, Take 1 pill daily on days 2-5.  Dispense: 6 tablet; Refill: 0 - chlorpheniramine-HYDROcodone (TUSSIONEX PENNKINETIC ER) 10-8 MG/5ML SUER; Take 5 mLs by mouth every 12 (twelve) hours as needed for cough.  Dispense: 140 mL; Refill: 0 -Discussed antibiotic therapy as an option for patient based on higher risk of bacterial infection due to underlying asthma, morbid obesity, and  diabetes. -Patient declines Tessalon and requests Tussionex stating this has worked well for her in the past. Discussed PRN use in detail. -Continue to push fluids.  -Continue flonase -Red flags and when to present for emergency care including chest pain, shortness of breath unrelieved with inhaler use, new/worsening symptoms, and fever >101.59F reviewed with patient at time of visit. Follow up and care instructions discussed and provided in AVS.  2. Moderate persistent asthma without complication -Advised to continue using daily Avapro and PRN Proair.  -Reviewed Health Maintenance: Pt states had Opthalmology Exam in November 02, 2016; Requested that patient ensure these records are supplied to the clinic.  I have reviewed this encounter including the documentation in this note and/or discussed this patient with the Johney Maine, FNP, NP-C. I am certifying that I agree with the content of this note as supervising physician.  Steele Sizer, MD [facility] 12/10/2016, 1:30 PM

## 2016-12-09 NOTE — Patient Instructions (Addendum)
Follow up in 3-5 days if not improving.  Acute Bronchitis, Adult Acute bronchitis is when air tubes (bronchi) in the lungs suddenly get swollen. The condition can make it hard to breathe. It can also cause these symptoms:  A cough.  Coughing up clear, yellow, or green mucus.  Wheezing.  Chest congestion.  Shortness of breath.  A fever.  Body aches.  Chills.  A sore throat. Follow these instructions at home: Medicines   Take over-the-counter and prescription medicines only as told by your doctor.  If you were prescribed an antibiotic medicine, take it as told by your doctor. Do not stop taking the antibiotic even if you start to feel better. General instructions   Rest.  Drink enough fluids to keep your pee (urine) clear or pale yellow.  Avoid smoking and secondhand smoke. If you smoke and you need help quitting, ask your doctor. Quitting will help your lungs heal faster.  Use an inhaler, cool mist vaporizer, or humidifier as told by your doctor.  Keep all follow-up visits as told by your doctor. This is important. How is this prevented? To lower your risk of getting this condition again:  Wash your hands often with soap and water. If you cannot use soap and water, use hand sanitizer.  Avoid contact with people who have cold symptoms.  Try not to touch your hands to your mouth, nose, or eyes.  Make sure to get the flu shot every year. Contact a doctor if:  Your symptoms do not get better in 2 weeks. Get help right away if:  You cough up blood.  You have chest pain.  You have very bad shortness of breath.  You become dehydrated.  You faint (pass out) or keep feeling like you are going to pass out.  You keep throwing up (vomiting).  You have a very bad headache.  Your fever or chills gets worse. This information is not intended to replace advice given to you by your health care provider. Make sure you discuss any questions you have with your health  care provider. Document Released: 01/26/2008 Document Revised: 03/17/2016 Document Reviewed: 01/28/2016 Elsevier Interactive Patient Education  2017 [employer].

## 2016-12-09 NOTE — Telephone Encounter (Signed)
COMPLETED

## 2016-12-10 ENCOUNTER — Ambulatory Visit: Payer: Medicare PPO | Admitting: Family Medicine

## 2016-12-23 ENCOUNTER — Encounter: Payer: Self-pay | Admitting: Family Medicine

## 2016-12-23 ENCOUNTER — Ambulatory Visit (INDEPENDENT_AMBULATORY_CARE_PROVIDER_SITE_OTHER): Payer: Medicare PPO | Admitting: Family Medicine

## 2016-12-23 VITALS — BP 124/68 | HR 110 | Temp 98.1°F | Resp 18 | Ht 69.0 in | Wt 381.3 lb

## 2016-12-23 DIAGNOSIS — I5033 Acute on chronic diastolic (congestive) heart failure: Secondary | ICD-10-CM

## 2016-12-23 DIAGNOSIS — N183 Chronic kidney disease, stage 3 unspecified: Secondary | ICD-10-CM

## 2016-12-23 DIAGNOSIS — Z794 Long term (current) use of insulin: Secondary | ICD-10-CM

## 2016-12-23 DIAGNOSIS — I10 Essential (primary) hypertension: Secondary | ICD-10-CM | POA: Diagnosis not present

## 2016-12-23 DIAGNOSIS — I491 Atrial premature depolarization: Secondary | ICD-10-CM

## 2016-12-23 DIAGNOSIS — I110 Hypertensive heart disease with heart failure: Secondary | ICD-10-CM

## 2016-12-23 DIAGNOSIS — J209 Acute bronchitis, unspecified: Secondary | ICD-10-CM

## 2016-12-23 DIAGNOSIS — E669 Obesity, unspecified: Secondary | ICD-10-CM | POA: Diagnosis not present

## 2016-12-23 DIAGNOSIS — E1165 Type 2 diabetes mellitus with hyperglycemia: Secondary | ICD-10-CM

## 2016-12-23 DIAGNOSIS — IMO0002 Reserved for concepts with insufficient information to code with codable children: Secondary | ICD-10-CM

## 2016-12-23 DIAGNOSIS — E1122 Type 2 diabetes mellitus with diabetic chronic kidney disease: Secondary | ICD-10-CM

## 2016-12-23 DIAGNOSIS — M109 Gout, unspecified: Secondary | ICD-10-CM

## 2016-12-23 DIAGNOSIS — J4540 Moderate persistent asthma, uncomplicated: Secondary | ICD-10-CM | POA: Diagnosis not present

## 2016-12-23 DIAGNOSIS — G4733 Obstructive sleep apnea (adult) (pediatric): Secondary | ICD-10-CM

## 2016-12-23 DIAGNOSIS — E559 Vitamin D deficiency, unspecified: Secondary | ICD-10-CM | POA: Diagnosis not present

## 2016-12-23 DIAGNOSIS — E1129 Type 2 diabetes mellitus with other diabetic kidney complication: Secondary | ICD-10-CM | POA: Insufficient documentation

## 2016-12-23 DIAGNOSIS — E782 Mixed hyperlipidemia: Secondary | ICD-10-CM | POA: Diagnosis not present

## 2016-12-23 LAB — COMPLETE METABOLIC PANEL WITH GFR
ALBUMIN: 3.7 g/dL (ref 3.6–5.1)
ALK PHOS: 90 U/L (ref 33–130)
ALT: 8 U/L (ref 6–29)
AST: 16 U/L (ref 10–35)
BILIRUBIN TOTAL: 0.4 mg/dL (ref 0.2–1.2)
BUN: 34 mg/dL — AB (ref 7–25)
CALCIUM: 8.9 mg/dL (ref 8.6–10.4)
CO2: 22 mmol/L (ref 20–31)
CREATININE: 1.45 mg/dL — AB (ref 0.50–0.99)
Chloride: 104 mmol/L (ref 98–110)
GFR, Est African American: 43 mL/min — ABNORMAL LOW (ref >=60)
GFR, Est Non African American: 37 mL/min — ABNORMAL LOW (ref >=60)
GLUCOSE: 154 mg/dL — AB (ref 65–99)
POTASSIUM: 5.3 mmol/L (ref 3.5–5.3)
Sodium: 140 mmol/L (ref 135–146)
TOTAL PROTEIN: 6.9 g/dL (ref 6.1–8.1)

## 2016-12-23 LAB — LIPID PANEL
Cholesterol: 133 mg/dL (ref <200)
HDL: 53 mg/dL (ref >50)
LDL Cholesterol: 61 mg/dL (ref <100)
Total CHOL/HDL Ratio: 2.5 Ratio (ref <5.0)
Triglycerides: 94 mg/dL (ref <150)
VLDL: 19 mg/dL (ref <30)

## 2016-12-23 LAB — CBC
HCT: 36.7 % (ref 35.0–45.0)
HEMOGLOBIN: 11.7 g/dL (ref 11.7–15.5)
MCH: 27.7 pg (ref 27.0–33.0)
MCHC: 31.9 g/dL — AB (ref 32.0–36.0)
MCV: 86.8 fL (ref 80.0–100.0)
MPV: 10 fL (ref 7.5–12.5)
PLATELETS: 288 10*3/uL (ref 140–400)
RBC: 4.23 MIL/uL (ref 3.80–5.10)
RDW: 16.2 % — AB (ref 11.0–15.0)
WBC: 11 10*3/uL — AB (ref 3.8–10.8)

## 2016-12-23 LAB — POCT GLYCOSYLATED HEMOGLOBIN (HGB A1C): Hemoglobin A1C: 6.6

## 2016-12-23 MED ORDER — DULAGLUTIDE 1.5 MG/0.5ML ~~LOC~~ SOAJ: 1.5000 mg | SUBCUTANEOUS | 2 refills | Status: DC

## 2016-12-23 MED ORDER — IRBESARTAN 300 MG PO TABS: 300.0000 mg | ORAL_TABLET | Freq: Every day | ORAL | 1 refills | Status: DC

## 2016-12-23 MED ORDER — FUROSEMIDE 40 MG PO TABS: 40.0000 mg | ORAL_TABLET | Freq: Two times a day (BID) | ORAL | 1 refills | Status: DC | PRN

## 2016-12-23 MED ORDER — ALBUTEROL SULFATE (2.5 MG/3ML) 0.083% IN NEBU
2.5000 mg | INHALATION_SOLUTION | Freq: Once | RESPIRATORY_TRACT | Status: AC
  Administered 2016-12-23: 2.5 mg via RESPIRATORY_TRACT

## 2016-12-23 MED ORDER — DILTIAZEM HCL ER COATED BEADS 240 MG PO CP24: 240.0000 mg | ORAL_CAPSULE | Freq: Every day | ORAL | 0 refills | Status: DC

## 2016-12-23 MED ORDER — FLUTICASONE-SALMETEROL 250-50 MCG/DOSE IN AEPB: 1.0000 | INHALATION_SPRAY | Freq: Two times a day (BID) | RESPIRATORY_TRACT | 3 refills | Status: DC

## 2016-12-23 MED ORDER — ALLOPURINOL 100 MG PO TABS: 100.0000 mg | ORAL_TABLET | Freq: Two times a day (BID) | ORAL | 1 refills | Status: DC

## 2016-12-23 MED ORDER — PRAVASTATIN SODIUM 80 MG PO TABS: 80.0000 mg | ORAL_TABLET | Freq: Every evening | ORAL | 1 refills | Status: DC

## 2016-12-23 NOTE — Progress Notes (Signed)
Name: Amber Reyes   MRN: [id number]    DOB: 1946-07-25   Date:12/23/2016       Progress Note  Subjective  Chief Complaint  Chief Complaint  Patient presents with  . Hypertension  . Diabetes    4 month follow up    HPI  Asthma Moderate: She was seen recently for bronchitis, and has been doing well. Notes lingering cough and occasional wheezing. She uses albuterol inhaler once or twice a week at most.  She has been taking advair. She is no longer experiencing daily shortness of breath.  DM: Blood sugars lowest - 85, average - 110-120, highest - 130. A1C today is 6.6 (A1C on 08/25/16 was 7.0).  She has CKI and is on ARB. She is seeing nephrologist at [facility], Dr. Alfonse Alpers She is off Metformin, last GFR 29 .  She is drinking plenty of water and using Insulin (uses 60units QAM, and 38 units QHS). She denies polyphagia, polydipsia or polyuria. On aspirin and statin therapy. Has been eating small meals throughout the day - she states she is not hungry enough to have 3 full meals throughout the day. Eye appt 11/02/16, need records from patient.  Morbid Obesity: she has lost almost 11lbs since January, no longer drinking sodas or sweet tea, drinking more water and is eating healthier, but she states she has been walking a little more after seeing podiatrist to remove ingrown toenails, but back pain still prevents her from walking longer distances. (explained moving will help her lose weight)  HTN: Home BP readings at home lowest - 110/60's, average- 130/70, highest- 130/60's  she is on lower dose of Cardizem down from 360 to 240mg  and denies dizziness since the change, no chest pain, but she has occasional palpitation, reviewed records and she does not have Afib but she has a history of PAC and PVC and is seen by Dr. Fletcher Anon. Taking Cardizem, Avapro; she is off of Hydralazine, Toprol, and amlodipine and is feeling significantly better.  Hypertensive CHF: diastolic, on ARB, aspirin, diltiazem 240mg , Lasix.  No longer taking amlodipine or metroprolol and has been doing well without dizziness episodes.  OSA/pulmonary hypertension: she is wearing CPAP nightly  CKI stage III: Denies episodes of pruritis or dry skin, but has had it in the past, it improves with lotion, normal urine output. No dysuria.  Controlled gout: no recent episodes taking Allopurinol as prescribed  Patient Active Problem List   Diagnosis Date Noted  . Acute on chronic diastolic congestive heart failure ([place]) 05/21/2016  . Venous insufficiency 05/21/2016  . Hypertensive heart disease 05/21/2016  . Anemia in chronic kidney disease 12/22/2015  . Pulmonary hypertension ([place]) 12/18/2015  . 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 Anxiety 04/12/2015  . Chronic kidney disease (CKD), stage III (moderate) 04/12/2015  . Edema extremities 04/12/2015  . 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 Asthma, moderate persistent 04/12/2015  . Super obesity 04/12/2015  . Obstructive apnea 04/12/2015  . Restless leg 04/12/2015  . Allergic rhinitis 04/12/2015  . Vitamin D deficiency 04/12/2015  . Controlled gout 04/12/2015  . Premature atrial contractions 11/29/2013  . Benign essential hypertension   . Hyperlipidemia     Past Surgical History:  Procedure Laterality Date  . CARDIAC CATHETERIZATION  2002   [facility]  . PERICARDIUM SURGERY      Family History  Problem Relation Age of Onset  . Heart attack Mother   . Hypertension Mother     Social History   Social History  .  Marital status: Married    Spouse name: N/A  . Number of children: N/A  . Years of education: N/A   Occupational History  . retired    Social History Main Topics  . Smoking status: Never Smoker  . Smokeless tobacco: Never Used  . Alcohol use No  . Drug use: No  . Sexual activity: Not Currently   Other Topics Concern  . Not on file   Social History Narrative  . No narrative on file     Current Outpatient Prescriptions:  .  albuterol (PROVENTIL  HFA;VENTOLIN HFA) 108 (90 BASE) MCG/ACT inhaler, Inhale 2 puffs into the lungs every 6 (six) hours as needed for wheezing or shortness of breath., Disp: , Rfl:  .  allopurinol (ZYLOPRIM) 100 MG tablet, Take 1 tablet (100 mg total) by mouth 2 (two) times daily., Disp: 180 tablet, Rfl: 1 .  aspirin EC 81 MG tablet, Take 81 mg by mouth daily., Disp: , Rfl:  .  azithromycin (ZITHROMAX) 250 MG tablet, Take 2 pills on day 1, Take 1 pill daily on days 2-5., Disp: 6 tablet, Rfl: 0 .  budesonide (PULMICORT) 0.25 MG/2ML nebulizer solution, Take 2 mLs (0.25 mg total) by nebulization 2 (two) times daily., Disp: 120 mL, Rfl: 2 .  chlorpheniramine-HYDROcodone (TUSSIONEX PENNKINETIC ER) 10-8 MG/5ML SUER, Take 5 mLs by mouth every 12 (twelve) hours as needed for cough., Disp: 140 mL, Rfl: 0 .  Cholecalciferol (VITAMIN D) 2000 UNITS tablet, Take 2,000 Units by mouth daily., Disp: , Rfl:  .  COLCRYS 0.6 MG tablet, TAKE ONE TABLET BY MOUTH TWICE DAILY AS NEEDED FOR GOUT, Disp: 30 tablet, Rfl: 5 .  diltiazem (CARTIA XT) 240 MG 24 hr capsule, Take 1 capsule (240 mg total) by mouth daily., Disp: 30 capsule, Rfl: 2 .  Dulaglutide (TRULICITY) 1.5 XQ/1.1HE SOPN, Inject 1.5 mg into the skin once a week., Disp: 2 mL, Rfl: 2 .  Elastic Bandages & Supports (MEDICAL COMPRESSION STOCKINGS) MISC, 2 each by Does not apply route daily., Disp: 2 each, Rfl: 2 .  fluticasone (FLONASE) 50 MCG/ACT nasal spray, Place 2 sprays into both nostrils daily., Disp: 48 g, Rfl: 1 .  furosemide (LASIX) 40 MG tablet, Take 1 tablet (40 mg total) by mouth 2 (two) times daily as needed., Disp: 180 tablet, Rfl: 1 .  Insulin Lispro Prot & Lispro (HUMALOG MIX 75/25 KWIKPEN) (75-25) 100 UNIT/ML Kwikpen, Inject 40-60 Units into the skin 2 (two) times daily. (Patient taking differently: Inject 40-60 Units into the skin 2 (two) times daily. 60 units am, 40 units pm), Disp: 15 mL, Rfl: 2 .  Insulin Pen Needle (NOVOFINE) 30G X 8 MM MISC, Inject 10 each into the  skin daily., Disp: 100 each, Rfl: 1 .  irbesartan (AVAPRO) 300 MG tablet, Take 1 tablet (300 mg total) by mouth daily., Disp: 90 tablet, Rfl: 1 .  pravastatin (PRAVACHOL) 80 MG tablet, Take 1 tablet (80 mg total) by mouth every evening., Disp: 90 tablet, Rfl: 1  No Known Allergies   ROS  Constitutional: Negative for fever or weight change.  Respiratory: Positive for cough and negative for shortness of breath.   Cardiovascular: Negative for chest pain, occasional palpitations.  Gastrointestinal: Negative for abdominal pain, no bowel changes.  Musculoskeletal: Negative for gait problem or joint swelling.  Skin: Negative for rash.  Neurological: Negative for dizziness or headache.  No other specific complaints in a complete review of systems (except as listed in HPI above).  Objective  Vitals:  12/23/16 0814  BP: 124/68  Pulse: (!) 110  Resp: 18  Temp: 98.1 F (36.7 C)  SpO2: 95%  Weight: (!) 381 lb 5 oz (173 kg)  Height: 5\' 9"  (1.753 m)    Body mass index is 56.31 kg/m.  Physical Exam Constitutional: Patient appears well-developed and well-nourished. Morbidly obese, No distress.  HENT: Head: Normocephalic and atraumatic.  Mouth/Throat: Oropharynx is clear and moist. No oropharyngeal exudate.  Eyes: Conjunctivae and EOM are normal. Pupils are equal, round, and reactive to light. No scleral icterus.  Neck: Normal range of motion. Neck supple. No JVD present.  Cardiovascular: Normal rate, regular rhythm and normal heart sounds.  No murmur heard. No BLE edema. Pulmonary/Chest: Effort normal and breath sounds rhonchi throughout. No respiratory distress. Reassessment after neb treatment finds breath sounds significantly clearer with mild rhonchi in bilateral bases. Abdominal: Soft. Bowel sounds are normal, no distension. There is no tenderness. no masses Musculoskeletal: Normal range of motion, no joint effusions. No gross deformities Neurological: he is alert and oriented to  person, place, and time. No cranial nerve deficit. Coordination, balance, strength, speech and gait are normal.  Skin: Skin is warm and dry. No rash noted. No erythema.  Psychiatric: Patient has a normal mood and affect. behavior is normal. Judgment and thought content normal.  Recent Results (from the past 2160 hour(s))  POCT Glucose (CBG)     Status: Abnormal   Collection Time: 10/01/16 12:44 PM  Result Value Ref Range   POC Glucose 117 (A) 70 - 99 mg/dl  POCT HgB A1C     Status: Abnormal   Collection Time: 12/23/16  8:18 AM  Result Value Ref Range   Hemoglobin A1C 6.6     PHQ2/9: Depression screen [facility] 2/9 09/28/2016 08/25/2016 06/04/2016 04/20/2016 01/20/2016  Decreased Interest 0 0 0 0 0  Down, Depressed, Hopeless 0 0 0 0 0  PHQ - 2 Score 0 0 0 0 0    Fall Risk: Fall Risk  09/28/2016 08/25/2016 06/04/2016 04/20/2016 01/20/2016  Falls in the past year? No No No No No     Assessment & Plan  1. Chronic kidney disease (CKD), stage III (moderate) - POCT HgB A1C - Parathyroid hormone, intact (no Ca) - COMPLETE METABOLIC PANEL WITH GFR - CBC - Microalbumin / creatinine urine ratio  2. Benign essential hypertension  - diltiazem (CARTIA XT) 240 MG 24 hr capsule; Take 1 capsule (240 mg total) by mouth daily.  Dispense: 90 capsule; Refill: 0 - COMPLETE METABOLIC PANEL WITH GFR - Microalbumin / creatinine urine ratio  3. Premature atrial contractions  - diltiazem (CARTIA XT) 240 MG 24 hr capsule; Take 1 capsule (240 mg total) by mouth daily.  Dispense: 90 capsule; Refill: 0  4. Acute on chronic diastolic congestive heart failure (HCC)  - diltiazem (CARTIA XT) 240 MG 24 hr capsule; Take 1 capsule (240 mg total) by mouth daily.  Dispense: 90 capsule; Refill: 0  5. Moderate persistent asthma without complication  - Fluticasone-Salmeterol (ADVAIR DISKUS) 250-50 MCG/DOSE AEPB; Inhale 1 puff into the lungs 2 (two) times daily.  Dispense: 1 each; Refill: 3 - albuterol (PROVENTIL) (2.5  MG/3ML) 0.083% nebulizer solution 2.5 mg; Take 3 mLs (2.5 mg total) by nebulization once.  6. Obstructive apnea Continue Cpap  7. Mixed hyperlipidemia  - Lipid panel - pravastatin (PRAVACHOL) 80 MG tablet; Take 1 tablet (80 mg total) by mouth every evening.  Dispense: 90 tablet; Refill: 1  8. Super obesity Continue with lifestyle modifications and  dietary changes  9. Vitamin D deficiency  - VITAMIN D 25 Hydroxy (Vit-D Deficiency, Fractures)  10. Controlled gout  - allopurinol (ZYLOPRIM) 100 MG tablet; Take 1 tablet (100 mg total) by mouth 2 (two) times daily.  Dispense: 180 tablet; Refill: 1  11. Uncontrolled type 2 diabetes mellitus with stage 3 chronic kidney disease, with long-term current use of insulin (HCC)  - Dulaglutide (TRULICITY) 1.5 EZ/5.0ZT SOPN; Inject 1.5 mg into the skin once a week.  Dispense: 2 mL; Refill: 2 -Will obtain consent for Eye appt report. -Continue insulin as prescribed.  12. Hypertensive heart disease with heart failure (HCC)  - furosemide (LASIX) 40 MG tablet; Take 1 tablet (40 mg total) by mouth 2 (two) times daily as needed.  Dispense: 180 tablet; Refill: 1  13. Essential hypertension, benign  - irbesartan (AVAPRO) 300 MG tablet; Take 1 tablet (300 mg total) by mouth daily.  Dispense: 90 tablet; Refill: 1  14. Acute bronchitis with bronchospasm - albuterol (PROVENTIL) (2.5 MG/3ML) 0.083% nebulizer solution 2.5 mg; Take 3 mLs (2.5 mg total) by nebulization once.  -Mucinex OTC and Claritin OTC for continued chest congestion.  RTC if fever, lingering cough/chest congestion, chest pain or shortness of breath.  I saw the patient with Raelyn Ensign, FNP, NP-C. She was the scriber of this note  Steele Sizer, MD [facility] 12/23/2016, 9:17 AM

## 2016-12-24 ENCOUNTER — Encounter: Payer: Self-pay | Admitting: Family Medicine

## 2016-12-24 LAB — MICROALBUMIN / CREATININE URINE RATIO
CREATININE, URINE: 109 mg/dL (ref 20–320)
MICROALB UR: 3.3 mg/dL
MICROALB/CREAT RATIO: 30 ug/mg{creat} — AB (ref <30)

## 2016-12-24 LAB — PARATHYROID HORMONE, INTACT (NO CA): PTH: 122 pg/mL — ABNORMAL HIGH (ref 14–64)

## 2016-12-24 LAB — VITAMIN D 25 HYDROXY (VIT D DEFICIENCY, FRACTURES): VIT D 25 HYDROXY: 42 ng/mL (ref 30–100)

## 2016-12-27 ENCOUNTER — Telehealth: Payer: Self-pay

## 2016-12-27 NOTE — Telephone Encounter (Signed)
Erroneous Entry  

## 2016-12-30 ENCOUNTER — Ambulatory Visit (INDEPENDENT_AMBULATORY_CARE_PROVIDER_SITE_OTHER): Payer: Medicare PPO | Admitting: Family Medicine

## 2016-12-30 ENCOUNTER — Encounter: Payer: Self-pay | Admitting: Family Medicine

## 2016-12-30 ENCOUNTER — Ambulatory Visit
Admission: RE | Admit: 2016-12-30 | Discharge: 2016-12-30 | Disposition: A | Discharge status: Home / Self Care | Payer: Medicare PPO | Source: Ambulatory Visit | Attending: Family Medicine | Admitting: Family Medicine

## 2016-12-30 VITALS — BP 134/66 | HR 104 | Temp 98.0°F | Resp 18 | Ht 69.0 in | Wt 383.4 lb

## 2016-12-30 DIAGNOSIS — J4540 Moderate persistent asthma, uncomplicated: Secondary | ICD-10-CM | POA: Diagnosis not present

## 2016-12-30 DIAGNOSIS — R05 Cough: Secondary | ICD-10-CM | POA: Diagnosis not present

## 2016-12-30 DIAGNOSIS — J209 Acute bronchitis, unspecified: Secondary | ICD-10-CM

## 2016-12-30 DIAGNOSIS — I700 Atherosclerosis of aorta: Secondary | ICD-10-CM | POA: Insufficient documentation

## 2016-12-30 DIAGNOSIS — R059 Cough, unspecified: Secondary | ICD-10-CM

## 2016-12-30 DIAGNOSIS — R0602 Shortness of breath: Secondary | ICD-10-CM | POA: Diagnosis not present

## 2016-12-30 MED ORDER — PREDNISONE 10 MG PO TABS: 10.0000 mg | ORAL_TABLET | Freq: Two times a day (BID) | ORAL | 0 refills | Status: DC

## 2016-12-30 MED ORDER — HYDROCOD POLST-CPM POLST ER 10-8 MG/5ML PO SUER: 5.0000 mL | Freq: Two times a day (BID) | ORAL | 0 refills | Status: DC | PRN

## 2016-12-30 MED ORDER — PREDNISONE 10 MG PO TABS: 10.0000 mg | ORAL_TABLET | Freq: Every day | ORAL | 0 refills | Status: DC

## 2016-12-30 NOTE — Patient Instructions (Addendum)
-  Decrease Neb treatment use to once daily. Continue Advair twice daily and Albuterol inhaler as needed. Go to have your Chest Xray done today.

## 2016-12-30 NOTE — Progress Notes (Addendum)
Name: Amber Reyes   MRN: [id number]    DOB: 1946-02-01   Date:12/30/2016       Progress Note  Subjective  Chief Complaint  Chief Complaint  Patient presents with  . Cough    HPI  Pt presents to follow up on continued non-productive cough. She was seen and treated for acute bronchitis and bronchospasm on 12/09/16, with Azithromycin and has had continued coughing and bronchospasm since. She has a long history of moderate persistent asthma.  She denies sputum, chest congestion, wheezing or shortness of breath, chest pain, no fevers/chills/fatigue, no NVD. She is doing albuterol neb treatments BID. Using Advair BID, and rarely using Rescue inhaler. She ran out of Tussionex a few days ago (was taking 1-2 times daily PRN for cough). She notes she has done well with prednisone for cough and asthma exacerbation in the past.    Patient Active Problem List   Diagnosis Date Noted  . Uncontrolled type 2 diabetes mellitus with stage 3 chronic kidney disease, with long-term current use of insulin ([place]) 12/23/2016  . Acute on chronic diastolic congestive heart failure ([place]) 05/21/2016  . Venous insufficiency 05/21/2016  . Hypertensive heart disease 05/21/2016  . Anemia in chronic kidney disease 12/22/2015  . Pulmonary hypertension ([place]) 12/18/2015  . 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 Anxiety 04/12/2015  . Chronic kidney disease (CKD), stage III (moderate) 04/12/2015  . Edema extremities 04/12/2015  . 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 Asthma, moderate persistent 04/12/2015  . Super obesity 04/12/2015  . Obstructive apnea 04/12/2015  . Restless leg 04/12/2015  . Allergic rhinitis 04/12/2015  . Vitamin D deficiency 04/12/2015  . Controlled gout 04/12/2015  . Premature atrial contractions 11/29/2013  . Benign essential hypertension   . Hyperlipidemia     Social History  Substance Use Topics  . Smoking status: Never Smoker  . Smokeless tobacco: Never Used     Comment: n/a  . Alcohol  use No     Current Outpatient Prescriptions:  .  albuterol (PROVENTIL HFA;VENTOLIN HFA) 108 (90 BASE) MCG/ACT inhaler, Inhale 2 puffs into the lungs every 6 (six) hours as needed for wheezing or shortness of breath., Disp: , Rfl:  .  allopurinol (ZYLOPRIM) 100 MG tablet, Take 1 tablet (100 mg total) by mouth 2 (two) times daily., Disp: 180 tablet, Rfl: 1 .  aspirin EC 81 MG tablet, Take 81 mg by mouth daily., Disp: , Rfl:  .  chlorpheniramine-HYDROcodone (TUSSIONEX PENNKINETIC ER) 10-8 MG/5ML SUER, Take 5 mLs by mouth every 12 (twelve) hours as needed for cough., Disp: 140 mL, Rfl: 0 .  Cholecalciferol (VITAMIN D) 2000 UNITS tablet, Take 2,000 Units by mouth daily., Disp: , Rfl:  .  COLCRYS 0.6 MG tablet, TAKE ONE TABLET BY MOUTH TWICE DAILY AS NEEDED FOR GOUT, Disp: 30 tablet, Rfl: 5 .  diltiazem (CARTIA XT) 240 MG 24 hr capsule, Take 1 capsule (240 mg total) by mouth daily., Disp: 90 capsule, Rfl: 0 .  Dulaglutide (TRULICITY) 1.5 JJ/0.0XF SOPN, Inject 1.5 mg into the skin once a week., Disp: 2 mL, Rfl: 2 .  Elastic Bandages & Supports (MEDICAL COMPRESSION STOCKINGS) MISC, 2 each by Does not apply route daily., Disp: 2 each, Rfl: 2 .  fluticasone (FLONASE) 50 MCG/ACT nasal spray, Place 2 sprays into both nostrils daily., Disp: 48 g, Rfl: 1 .  Fluticasone-Salmeterol (ADVAIR DISKUS) 250-50 MCG/DOSE AEPB, Inhale 1 puff into the lungs 2 (two) times daily., Disp: 1 each, Rfl: 3 .  furosemide (LASIX) 40  MG tablet, Take 1 tablet (40 mg total) by mouth 2 (two) times daily as needed., Disp: 180 tablet, Rfl: 1 .  Insulin Lispro Prot & Lispro (HUMALOG MIX 75/25 KWIKPEN) (75-25) 100 UNIT/ML Kwikpen, Inject 40-60 Units into the skin 2 (two) times daily. (Patient taking differently: Inject 40-60 Units into the skin 2 (two) times daily. 60 units am, 40 units pm), Disp: 15 mL, Rfl: 2 .  Insulin Pen Needle (NOVOFINE) 30G X 8 MM MISC, Inject 10 each into the skin daily., Disp: 100 each, Rfl: 1 .  irbesartan  (AVAPRO) 300 MG tablet, Take 1 tablet (300 mg total) by mouth daily., Disp: 90 tablet, Rfl: 1 .  pravastatin (PRAVACHOL) 80 MG tablet, Take 1 tablet (80 mg total) by mouth every evening., Disp: 90 tablet, Rfl: 1 .  predniSONE (DELTASONE) 10 MG tablet, Take 1 tablet (10 mg total) by mouth 2 (two) times daily with a meal., Disp: 10 tablet, Rfl: 0  No Known Allergies  ROS  Constitutional: Negative for fever or weight change.  Respiratory: Positive for cough and denies shortness of breath.   Cardiovascular: Negative for chest pain or palpitations.  Gastrointestinal: Negative for abdominal pain, no bowel changes.  Musculoskeletal: Negative for gait problem or joint swelling.  Skin: Negative for rash.  Neurological: Negative for dizziness or headache.  No other specific complaints in a complete review of systems (except as listed in HPI above).  Objective  Vitals:   12/30/16 1118 12/30/16 1211  BP: 134/66   Pulse: (!) 143 (!) 104  Resp: 18   Temp: 98 F (36.7 C)   TempSrc: Oral   SpO2: 98%   Weight: (!) 383 lb 6.4 oz (173.9 kg)   Height: 5\' 9"  (1.753 m)     Body mass index is 56.62 kg/m.  Nursing Note and Vital Signs reviewed. Heart rate elevated initially - pt used Albuterol treatment just prior to arrival. Recheck after pt sitting for several minutes was 104bpm.  Physical Exam Constitutional: Patient appears well-developed and well-nourished. Morbidly Obese No distress.  HEENT: head atraumatic, normocephalic, pupils equal and reactive to light, EOM's intact, TM's without erythema or bulging, no maxillary or frontal sinus pain on palpation, neck supple without lymphadenopathy, oropharynx pink and moist without exudate Cardiovascular: Slightly elevated rate - 106bpm auscultated, regular rhythm, S1/S2 present.  No murmur or rub heard. No BLE edema. Pulmonary/Chest: Effort normal and breath sounds find rhonchi in bilateral bases. No respiratory distress or retractions. -Spirometry  results reviewed - Asthma appears controlled at this time. Psychiatric: Patient has a normal mood and affect. behavior is normal. Judgment and thought content normal.  Recent Results (from the past 2160 hour(s))  POCT Glucose (CBG)     Status: Abnormal   Collection Time: 10/01/16 12:44 PM  Result Value Ref Range   POC Glucose 117 (A) 70 - 99 mg/dl  POCT HgB A1C     Status: Abnormal   Collection Time: 12/23/16  8:18 AM  Result Value Ref Range   Hemoglobin A1C 6.6   Parathyroid hormone, intact (no Ca)     Status: Abnormal   Collection Time: 12/23/16  9:41 AM  Result Value Ref Range   PTH 122 (H) 14 - 64 pg/mL    Comment:   Interpretive Guide:                              Intact PTH  Calcium                              ----------               ------- Normal Parathyroid           Normal                   Normal Hypoparathyroidism           Low or Low Normal        Low Hyperparathyroidism      Primary                 Normal or High           High      Secondary               High                     Normal or Low      Tertiary                High                     High Non-Parathyroid   Hypercalcemia              Low or Low Normal        High   COMPLETE METABOLIC PANEL WITH GFR     Status: Abnormal   Collection Time: 12/23/16  9:41 AM  Result Value Ref Range   Sodium 140 135 - 146 mmol/L   Potassium 5.3 3.5 - 5.3 mmol/L   Chloride 104 98 - 110 mmol/L   CO2 22 20 - 31 mmol/L   Glucose, Bld 154 (H) 65 - 99 mg/dL   BUN 34 (H) 7 - 25 mg/dL   Creat 1.45 (H) 0.50 - 0.99 mg/dL    Comment:   For patients > or = 69 years of age: The upper reference limit for Creatinine is approximately 13% higher for people identified as African-American.      Total Bilirubin 0.4 0.2 - 1.2 mg/dL   Alkaline Phosphatase 90 33 - 130 U/L   AST 16 10 - 35 U/L   ALT 8 6 - 29 U/L   Total Protein 6.9 6.1 - 8.1 g/dL   Albumin 3.7 3.6 - 5.1 g/dL   Calcium 8.9 8.6 - 10.4 mg/dL   GFR, Est  African American 43 (L) >=60 mL/min   GFR, Est Non African American 37 (L) >=60 mL/min  CBC     Status: Abnormal   Collection Time: 12/23/16  9:41 AM  Result Value Ref Range   WBC 11.0 (H) 3.8 - 10.8 K/uL   RBC 4.23 3.80 - 5.10 MIL/uL   Hemoglobin 11.7 11.7 - 15.5 g/dL   HCT 36.7 35.0 - 45.0 %   MCV 86.8 80.0 - 100.0 fL   MCH 27.7 27.0 - 33.0 pg   MCHC 31.9 (L) 32.0 - 36.0 g/dL   RDW 16.2 (H) 11.0 - 15.0 %   Platelets 288 140 - 400 K/uL   MPV 10.0 7.5 - 12.5 fL  VITAMIN D 25 Hydroxy (Vit-D Deficiency, Fractures)     Status: None   Collection Time: 12/23/16  9:41 AM  Result Value Ref Range   Vit D, 25-Hydroxy 42 30 - 100 ng/mL    Comment: Vitamin  D Status           25-OH Vitamin D        Deficiency                <20 ng/mL        Insufficiency         20 - 29 ng/mL        Optimal             > or = 30 ng/mL   For 25-OH Vitamin D testing on patients on D2-supplementation and patients for whom quantitation of D2 and D3 fractions is required, the QuestAssureD 25-OH VIT D, (D2,D3), LC/MS/MS is recommended: order code [phone] (patients > 2 yrs).   Lipid panel     Status: None   Collection Time: 12/23/16  9:41 AM  Result Value Ref Range   Cholesterol 133 <200 mg/dL   Triglycerides 94 <150 mg/dL   HDL 53 >50 mg/dL   Total CHOL/HDL Ratio 2.5 <5.0 Ratio   VLDL 19 <30 mg/dL   LDL Cholesterol 61 <100 mg/dL  Microalbumin / creatinine urine ratio     Status: Abnormal   Collection Time: 12/23/16  9:41 AM  Result Value Ref Range   Creatinine, Urine 109 20 - 320 mg/dL   Microalb, Ur 3.3 Not estab mg/dL   Microalb Creat Ratio 30 (H) <30 mcg/mg creat    Comment: The ADA has defined abnormalities in albumin excretion as follows:           Category           Result                            (mcg/mg creatinine)                 Normal:    <30       Microalbuminuria:    30 - 299   Clinical albuminuria:    > or = 300   The ADA recommends that at least two of three specimens collected  within a 3 - 6 month period be abnormal before considering a patient to be within a diagnostic category.        Assessment & Plan  1. Cough - Spirometry with Graph - chlorpheniramine-HYDROcodone (TUSSIONEX PENNKINETIC ER) 10-8 MG/5ML SUER; Take 5 mLs by mouth every 12 (twelve) hours as needed for cough.  Dispense: 140 mL; Refill: 0 - predniSONE (DELTASONE) 10 MG tablet; Take 1 tablet (10 mg total) by mouth 2 (two) times daily with a meal.  Dispense: 10 tablet; Refill: 0 - DG Chest 2 View; Future  2. Moderate persistent asthma without complication - Spirometry with Graph -Decrease Neb treatment use to once daily as needed for shortness of breath. Continue Advair BID and Albuterol inhaler PRN.  3. Acute bronchitis with bronchospasm - chlorpheniramine-HYDROcodone (TUSSIONEX PENNKINETIC ER) 10-8 MG/5ML SUER; Take 5 mLs by mouth every 12 (twelve) hours as needed for cough.  Dispense: 140 mL; Refill: 0 - predniSONE (DELTASONE) 10 MG tablet; Take 1 tablet (10 mg total) by mouth 2 (two) times daily with a meal.  Dispense: 10 tablet; Refill: 0 - DG Chest 2 View; Future  -Will follow up if no improvement in 3-5 days. -Red flags and when to present for emergency care or RTC including fever >101.85F, chest pain, shortness of breath, new/worsening/un-resolving symptoms reviewed with patient at time of visit. Follow up and care instructions discussed and  provided in AVS.  I have reviewed this encounter including the documentation in this note and/or discussed this patient with the Johney Maine, FNP, NP-C. I am certifying that I agree with the content of this note as supervising physician.  Steele Sizer, MD [facility] 12/30/2016, 2:21 PM

## 2017-01-05 DIAGNOSIS — J45909 Unspecified asthma, uncomplicated: Secondary | ICD-10-CM | POA: Diagnosis not present

## 2017-01-14 ENCOUNTER — Other Ambulatory Visit: Payer: Self-pay | Admitting: Pharmacist

## 2017-01-14 NOTE — Patient Outreach (Signed)
Outreach call to Amber Reyes to complete medication review for [facility] quality metric. Left a HIPAA compliant message on the patient's voicemail. If have not heard from patient by next week, will give her another call at that time.  Harlow Asa, PharmD, [facility] [phone]

## 2017-02-02 ENCOUNTER — Other Ambulatory Visit: Payer: Self-pay | Admitting: Pharmacist

## 2017-02-02 NOTE — Patient Outreach (Addendum)
Outreach call to Amber Reyes to complete medication review for [facility] quality metric. Outreach attempt #2. Left a HIPAA compliant message on the patient's voicemail. If have not heard from patient by 02/04/17, will give her another call at that time.  Harlow Asa, PharmD, [facility] [phone]

## 2017-02-04 ENCOUNTER — Other Ambulatory Visit: Payer: Self-pay | Admitting: Pharmacist

## 2017-02-04 NOTE — Patient Outreach (Signed)
Outreach call to Amber Reyes to complete medication review for [facility] quality metric. Outreach attempt #3. Left a HIPAA compliant message on the patient's voicemail.   Unable to reach patient. Have made 3 attempts to reach Amber Reyes by phone. Will close pharmacy episode at this time.  Harlow Asa, PharmD, [facility] [phone]

## 2017-02-05 DIAGNOSIS — J45909 Unspecified asthma, uncomplicated: Secondary | ICD-10-CM | POA: Diagnosis not present

## 2017-03-25 ENCOUNTER — Encounter: Payer: Self-pay | Admitting: Family Medicine

## 2017-03-25 ENCOUNTER — Ambulatory Visit (INDEPENDENT_AMBULATORY_CARE_PROVIDER_SITE_OTHER): Payer: Medicare PPO | Admitting: Family Medicine

## 2017-03-25 VITALS — BP 131/75 | HR 120 | Temp 97.8°F | Resp 17 | Ht 69.0 in | Wt 383.4 lb

## 2017-03-25 DIAGNOSIS — Z794 Long term (current) use of insulin: Secondary | ICD-10-CM | POA: Diagnosis not present

## 2017-03-25 DIAGNOSIS — G4733 Obstructive sleep apnea (adult) (pediatric): Secondary | ICD-10-CM | POA: Diagnosis not present

## 2017-03-25 DIAGNOSIS — R809 Proteinuria, unspecified: Secondary | ICD-10-CM

## 2017-03-25 DIAGNOSIS — E1129 Type 2 diabetes mellitus with other diabetic kidney complication: Secondary | ICD-10-CM | POA: Diagnosis not present

## 2017-03-25 DIAGNOSIS — I110 Hypertensive heart disease with heart failure: Secondary | ICD-10-CM | POA: Diagnosis not present

## 2017-03-25 DIAGNOSIS — M109 Gout, unspecified: Secondary | ICD-10-CM | POA: Diagnosis not present

## 2017-03-25 DIAGNOSIS — E782 Mixed hyperlipidemia: Secondary | ICD-10-CM

## 2017-03-25 DIAGNOSIS — I10 Essential (primary) hypertension: Secondary | ICD-10-CM | POA: Diagnosis not present

## 2017-03-25 DIAGNOSIS — J4540 Moderate persistent asthma, uncomplicated: Secondary | ICD-10-CM | POA: Diagnosis not present

## 2017-03-25 DIAGNOSIS — I700 Atherosclerosis of aorta: Secondary | ICD-10-CM | POA: Diagnosis not present

## 2017-03-25 DIAGNOSIS — I491 Atrial premature depolarization: Secondary | ICD-10-CM | POA: Diagnosis not present

## 2017-03-25 LAB — POCT GLYCOSYLATED HEMOGLOBIN (HGB A1C): Hemoglobin A1C: 6.7

## 2017-03-25 LAB — GLUCOSE, POCT (MANUAL RESULT ENTRY): POC Glucose: 101 mg/dl — AB (ref 70–99)

## 2017-03-25 MED ORDER — FLUTICASONE-SALMETEROL 250-50 MCG/DOSE IN AEPB: 1.0000 | INHALATION_SPRAY | Freq: Two times a day (BID) | RESPIRATORY_TRACT | 3 refills | Status: DC

## 2017-03-25 MED ORDER — DILTIAZEM HCL ER COATED BEADS 240 MG PO CP24: 240.0000 mg | ORAL_CAPSULE | Freq: Every day | ORAL | 1 refills | Status: DC

## 2017-03-25 MED ORDER — INSULIN LISPRO PROT & LISPRO (75-25 MIX) 100 UNIT/ML KWIKPEN: 38.0000 [IU] | PEN_INJECTOR | Freq: Two times a day (BID) | SUBCUTANEOUS | 1 refills | Status: DC

## 2017-03-25 MED ORDER — ALLOPURINOL 100 MG PO TABS: 100.0000 mg | ORAL_TABLET | Freq: Two times a day (BID) | ORAL | 1 refills | Status: DC

## 2017-03-25 MED ORDER — DULAGLUTIDE 1.5 MG/0.5ML ~~LOC~~ SOAJ: 1.5000 mg | SUBCUTANEOUS | 2 refills | Status: DC

## 2017-03-25 NOTE — Progress Notes (Signed)
Name: Amber Reyes   MRN: [id number]    DOB: Jun 18, 1946   Date:03/25/2017       Progress Note  Subjective  Chief Complaint  Chief Complaint  Patient presents with  . Diabetes    3 month follow up  . Hypertension  . Hyperlipidemia  . Obesity  . Chronic Kidney Disease  . Gout    HPI  DMII controlled: She has been compliant with medication, on Humalog 75/25 since Feb 2018, also started on Trulicity in 2440 and [id number] is now at goal. FSB at home has been fasting low 100's and 80's-150's  post-prandially. She checks it daily. Taking Avapro for nephropathy- urine micro was normal in Dec, but up to 19 Jan 2017.Marland Kitchen No polyphagia, polydipsia or polyuria. No recent episodes of hypoglycemia. She is in the doughnut hole now and needs helps with her medication. Discussed Alamap  HTN/CHF: taking medications and denies side effects of medications, bp is at goal, she denies chest pain or palpitation.  At home bp below 130's, She has orthopnea, SOB is stable, and edema has been stable. She had an admission Fall 2017  Hyperlipidemia: taking Pravastatin, last LDL was at goal, we will continue current medication   RLS: symptoms are still present, described as soreness on both legs at night, improves when she rubs her legs. She stopped taking Requip but states symptoms are not any worse  CKIII: she is going to see nephrologist next month. Denies pruritus or decrease in urinary volume. Last GFR had improved, last urine micro was 30  OSA: she has been wearing CPAP machine but only a few times weekly. Advised to wear it every night, she has mid pulmonary hypertension also , found on echo done in 2015 by Dr Fletcher Anon  Morbid Obesity: her weight is stable. She is eating better now but still not active , she went to a bariatric surgery consultation, but afraid to have surgery.   Asthma Moderate persistent: one flare back in Feb and recurrent symptom secondary to URI back in May and had to take short  course of prednisone.  She states symptoms are controlled at this time, occasional cough and still has SOB with activity, but no wheezing.   Atrial Fibrillation: on Cardizem CD, no palpitation, only SOB with mild  activity,  no chest pain, taking aspirin but down to 81 mg daily . She was given Eliquis back in 2015 but it was too expensive and no events since. She is not interested in anti-coagulation, discussed increased risk of strokes. .Seen by Dr. Fletcher Anon in 2015 and again in 2017 after admission for CHF. His last note does not mention Afib, but was discussed in the past.   Patient Active Problem List   Diagnosis Date Noted  . Thoracic aortic atherosclerosis ([place]) 12/30/2016  . Uncontrolled type 2 diabetes mellitus with stage 3 chronic kidney disease, with long-term current use of insulin ([place]) 12/23/2016  . Acute on chronic diastolic congestive heart failure ([place]) 05/21/2016  . Venous insufficiency 05/21/2016  . Hypertensive heart disease 05/21/2016  . Anemia in chronic kidney disease 12/22/2015  . Pulmonary hypertension ([place]) 12/18/2015  . 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 Anxiety 04/12/2015  . Chronic kidney disease (CKD), stage III (moderate) 04/12/2015  . Edema extremities 04/12/2015  . 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 Asthma, moderate persistent 04/12/2015  . Super obesity 04/12/2015  . Obstructive apnea 04/12/2015  . Restless leg 04/12/2015  . Allergic rhinitis 04/12/2015  . Vitamin D deficiency 04/12/2015  .  Controlled gout 04/12/2015  . Premature atrial contractions 11/29/2013  . Benign essential hypertension   . Hyperlipidemia     Past Surgical History:  Procedure Laterality Date  . CARDIAC CATHETERIZATION  2002   [facility]  . PERICARDIUM SURGERY      Family History  Problem Relation Age of Onset  . Heart attack Mother   . Hypertension Mother     Social History   Social History  . Marital status: Married    Spouse name: N/A  . Number of children: N/A  .  Years of education: N/A   Occupational History  . retired    Social History Main Topics  . Smoking status: Never Smoker  . Smokeless tobacco: Never Used     Comment: n/a  . Alcohol use No  . Drug use: No  . Sexual activity: Not Currently   Other Topics Concern  . Not on file   Social History Narrative  . No narrative on file     Current Outpatient Prescriptions:  .  albuterol (PROVENTIL HFA;VENTOLIN HFA) 108 (90 BASE) MCG/ACT inhaler, Inhale 2 puffs into the lungs every 6 (six) hours as needed for wheezing or shortness of breath., Disp: , Rfl:  .  allopurinol (ZYLOPRIM) 100 MG tablet, Take 1 tablet (100 mg total) by mouth 2 (two) times daily., Disp: 180 tablet, Rfl: 1 .  aspirin EC 81 MG tablet, Take 81 mg by mouth daily., Disp: , Rfl:  .  Cholecalciferol (VITAMIN D) 2000 UNITS tablet, Take 2,000 Units by mouth daily., Disp: , Rfl:  .  COLCRYS 0.6 MG tablet, TAKE ONE TABLET BY MOUTH TWICE DAILY AS NEEDED FOR GOUT, Disp: 30 tablet, Rfl: 5 .  diltiazem (CARTIA XT) 240 MG 24 hr capsule, Take 1 capsule (240 mg total) by mouth daily., Disp: 90 capsule, Rfl: 1 .  Dulaglutide (TRULICITY) 1.5 TG/6.2IR SOPN, Inject 1.5 mg into the skin once a week., Disp: 2 mL, Rfl: 2 .  Elastic Bandages & Supports (MEDICAL COMPRESSION STOCKINGS) MISC, 2 each by Does not apply route daily., Disp: 2 each, Rfl: 2 .  fluticasone (FLONASE) 50 MCG/ACT nasal spray, Place 2 sprays into both nostrils daily., Disp: 48 g, Rfl: 1 .  Fluticasone-Salmeterol (ADVAIR DISKUS) 250-50 MCG/DOSE AEPB, Inhale 1 puff into the lungs 2 (two) times daily., Disp: 1 each, Rfl: 3 .  furosemide (LASIX) 40 MG tablet, Take 1 tablet (40 mg total) by mouth 2 (two) times daily as needed., Disp: 180 tablet, Rfl: 1 .  Insulin Lispro Prot & Lispro (HUMALOG MIX 75/25 KWIKPEN) (75-25) 100 UNIT/ML Kwikpen, Inject 38-60 Units into the skin 2 (two) times daily., Disp: 15 pen, Rfl: 1 .  Insulin Pen Needle (NOVOFINE) 30G X 8 MM MISC, Inject 10 each  into the skin daily., Disp: 100 each, Rfl: 1 .  irbesartan (AVAPRO) 300 MG tablet, Take 1 tablet (300 mg total) by mouth daily., Disp: 90 tablet, Rfl: 1 .  pravastatin (PRAVACHOL) 80 MG tablet, Take 1 tablet (80 mg total) by mouth every evening., Disp: 90 tablet, Rfl: 1  No Known Allergies   ROS  Constitutional: Negative for fever or weight change.  Respiratory: Positive  For mild  cough and shortness of breath.   Cardiovascular: Negative for chest pain or palpitations.  Gastrointestinal: Negative for abdominal pain, no bowel changes.  Musculoskeletal: Negative for gait problem or joint swelling.  Skin: Negative for rash.  Neurological: Negative for dizziness or headache.  No other specific complaints in a complete review of  systems (except as listed in HPI above).  Objective  Vitals:   03/25/17 0812  BP: 131/75  Pulse: (!) 120  Resp: 17  Temp: 97.8 F (36.6 C)  TempSrc: Oral  SpO2: 96%  Weight: (!) 383 lb 6.4 oz (173.9 kg)  Height: 5\' 9"  (1.753 m)    Body mass index is 56.62 kg/m.  Physical Exam  Constitutional: Patient appears well-developed and well-nourished. Obese  No distress.  HEENT: head atraumatic, normocephalic, pupils equal and reactive to light, neck supple, throat within normal limits Cardiovascular: Normal rate, regular rhythm and normal heart sounds.  No murmur heard. 1 plus  BLE edema. Pulmonary/Chest: Effort normal and breath sounds normal. No respiratory distress. Abdominal: Soft.  There is no tenderness. Psychiatric: Patient has a normal mood and affect. behavior is normal. Judgment and thought content normal.  Recent Results (from the past 2160 hour(s))  POCT Glucose (CBG)     Status: Abnormal   Collection Time: 03/25/17  8:16 AM  Result Value Ref Range   POC Glucose 101 (A) 70 - 99 mg/dl  POCT HgB A1C     Status: Abnormal   Collection Time: 03/25/17  8:26 AM  Result Value Ref Range   Hemoglobin A1C 6.7     Diabetic Foot Exam: Diabetic Foot  Exam - Simple   Simple Foot Form Diabetic Foot exam was performed with the following findings:  Yes 03/25/2017  8:49 AM  Visual Inspection See comments:  Yes Sensation Testing Intact to touch and monofilament testing bilaterally:  Yes Pulse Check Posterior Tibialis and Dorsalis pulse intact bilaterally:  Yes Comments Hammer toes, long toe nails      PHQ2/9: Depression screen [facility] 2/9 03/25/2017 12/30/2016 09/28/2016 08/25/2016 06/04/2016  Decreased Interest 0 0 0 0 0  Down, Depressed, Hopeless 0 0 0 0 0  PHQ - 2 Score 0 0 0 0 0     Fall Risk: Fall Risk  03/25/2017 12/30/2016 09/28/2016 08/25/2016 06/04/2016  Falls in the past year? No No No No No    Functional Status Survey: Is the patient deaf or have difficulty hearing?: No Does the patient have difficulty seeing, even when wearing glasses/contacts?: Yes (glasses) Does the patient have difficulty concentrating, remembering, or making decisions?: No Does the patient have difficulty walking or climbing stairs?: Yes Does the patient have difficulty dressing or bathing?: No Does the patient have difficulty doing errands alone such as visiting a doctor's office or shopping?: No    Assessment & Plan  1. Controlled type 2 diabetes mellitus with microalbuminuria, with long-term current use of insulin (HCC)  At goal, glucose seems to be low at night , advised to titrate down dose of Humalog to 36 at night and monitor  - POCT HgB A1C - POCT Glucose (CBG) - Insulin Lispro Prot & Lispro (HUMALOG MIX 75/25 KWIKPEN) (75-25) 100 UNIT/ML Kwikpen; Inject 38-60 Units into the skin 2 (two) times daily.  Dispense: 15 pen; Refill: 1 - Dulaglutide (TRULICITY) 1.5 IP/3.8SN SOPN; Inject 1.5 mg into the skin once a week.  Dispense: 2 mL; Refill: 2  2. Thoracic aortic atherosclerosis ([place])  Continue medication : statin and aspirin   3. Moderate persistent asthma without complication  - Fluticasone-Salmeterol (ADVAIR DISKUS) 250-50 MCG/DOSE AEPB; Inhale  1 puff into the lungs 2 (two) times daily.  Dispense: 1 each; Refill: 3  4. Benign essential hypertension  - diltiazem (CARTIA XT) 240 MG 24 hr capsule; Take 1 capsule (240 mg total) by mouth daily.  Dispense: 90  capsule; Refill: 1  5. Mixed hyperlipidemia  Continue statin therapy  6. Obstructive apnea  She had a change in insurance and is waiting to find out which company is in her network, she will call us with the name  7. Hypertensive heart disease with heart failure (HCC)  Stable, sees Dr. Fletcher Anon  8. Morbid obesity, unspecified obesity type [facility])  Discussed with the patient the risk posed by an increased BMI. Discussed importance of portion control, calorie counting and at least 150 minutes of physical activity weekly. Avoid sweet beverages and drink more water. Eat at least 6 servings of fruit and vegetables daily   9. Premature atrial contractions  - diltiazem (CARTIA XT) 240 MG 24 hr capsule; Take 1 capsule (240 mg total) by mouth daily.  Dispense: 90 capsule; Refill: 1  10. Controlled gout  - allopurinol (ZYLOPRIM) 100 MG tablet; Take 1 tablet (100 mg total) by mouth 2 (two) times daily.  Dispense: 180 tablet; Refill: 1

## 2017-05-26 ENCOUNTER — Other Ambulatory Visit: Payer: Self-pay | Admitting: Family Medicine

## 2017-05-30 DIAGNOSIS — G4733 Obstructive sleep apnea (adult) (pediatric): Secondary | ICD-10-CM | POA: Diagnosis not present

## 2017-05-30 DIAGNOSIS — E785 Hyperlipidemia, unspecified: Secondary | ICD-10-CM | POA: Diagnosis not present

## 2017-05-30 DIAGNOSIS — I509 Heart failure, unspecified: Secondary | ICD-10-CM | POA: Diagnosis not present

## 2017-05-30 DIAGNOSIS — I130 Hypertensive heart and chronic kidney disease with heart failure and stage 1 through stage 4 chronic kidney disease, or unspecified chronic kidney disease: Secondary | ICD-10-CM | POA: Diagnosis not present

## 2017-05-30 DIAGNOSIS — Z7982 Long term (current) use of aspirin: Secondary | ICD-10-CM | POA: Diagnosis not present

## 2017-05-30 DIAGNOSIS — Z794 Long term (current) use of insulin: Secondary | ICD-10-CM | POA: Diagnosis not present

## 2017-05-30 DIAGNOSIS — E1122 Type 2 diabetes mellitus with diabetic chronic kidney disease: Secondary | ICD-10-CM | POA: Diagnosis not present

## 2017-05-30 DIAGNOSIS — N183 Chronic kidney disease, stage 3 (moderate): Secondary | ICD-10-CM | POA: Diagnosis not present

## 2017-06-02 ENCOUNTER — Encounter: Payer: Self-pay | Admitting: Cardiovascular Disease

## 2017-06-02 ENCOUNTER — Ambulatory Visit (INDEPENDENT_AMBULATORY_CARE_PROVIDER_SITE_OTHER): Payer: Medicare PPO | Admitting: Cardiovascular Disease

## 2017-06-02 VITALS — BP 154/60 | HR 78 | Ht 70.0 in | Wt 383.5 lb

## 2017-06-02 DIAGNOSIS — I5032 Chronic diastolic (congestive) heart failure: Secondary | ICD-10-CM

## 2017-06-02 DIAGNOSIS — I491 Atrial premature depolarization: Secondary | ICD-10-CM

## 2017-06-02 DIAGNOSIS — I10 Essential (primary) hypertension: Secondary | ICD-10-CM

## 2017-06-02 MED ORDER — DILTIAZEM HCL ER COATED BEADS 300 MG PO CP24: 300.0000 mg | ORAL_CAPSULE | Freq: Every day | ORAL | 3 refills | Status: DC

## 2017-06-02 NOTE — Progress Notes (Signed)
Cardiology Office Note   Date:  06/02/2017   ID:  Amber Reyes, DOB 1946/05/25, MRN [id number]  PCP:  Steele Sizer, MD  Cardiologist:   Kathlyn Sacramento, MD   Chief Complaint  Patient presents with  . other    6 month f/u c/o sob and fatigue. Meds reviewed verbally with pt.      History of Present Illness: Amber Reyes is a 71 y.o. female who presents for a followup visit regarding chronic diastolic heart failure and PACs. 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  Echocardiogram in 04/2016 showed normal LV systolic function with an ejection fraction of 69%, grade 1 diastolic dysfunction and mildly dilated left atrium. She has been doing reasonably well but reports increased palpitations. No chest pain. Stable exertional dyspnea. Her diabetes control has improved as well as her kidney function. Most recent creatinine was 1.2.   Past Medical History:  Diagnosis Date  . Allergic rhinitis, cause unspecified   . Anxiety   . Chronic diastolic CHF (congestive heart failure) ([place])   . CKD (chronic kidney disease), stage III ([place])   . Edema   . Essential hypertension, benign   . Gout, unspecified   . Heart murmur    as child  . Hyperlipidemia   . Lipoma of unspecified site   . Other and unspecified disc disorder of lumbar region   . Other general symptoms(780.99)   . PAC (premature atrial contraction)   . Renal insufficiency   . Restless legs syndrome (RLS)   . Sebaceous cyst   . Super obesity   . Type II or unspecified type diabetes mellitus without mention of complication, uncontrolled   . Unspecified asthma(493.90)   . Unspecified disease of pericardium   . Unspecified sleep apnea   . Unspecified vitamin D deficiency   . Vaginitis and vulvovaginitis, unspecified   . Venous insufficiency      Past Surgical History:  Procedure Laterality Date  . CARDIAC CATHETERIZATION  2002   [facility]  . PERICARDIUM SURGERY       Current Outpatient Prescriptions  Medication Sig Dispense Refill  . albuterol (PROVENTIL HFA;VENTOLIN HFA) 108 (90 BASE) MCG/ACT inhaler Inhale 2 puffs into the lungs every 6 (six) hours as needed for wheezing or shortness of breath.    . allopurinol (ZYLOPRIM) 100 MG tablet Take 1 tablet (100 mg total) by mouth 2 (two) times daily. 180 tablet 1  . aspirin EC 81 MG tablet Take 81 mg by mouth daily.    . Cholecalciferol (VITAMIN D) 2000 UNITS tablet Take 2,000 Units by mouth daily.    Marland Kitchen COLCRYS 0.6 MG tablet TAKE ONE TABLET BY MOUTH TWICE DAILY AS NEEDED FOR GOUT 30 tablet 5  . diltiazem (CARTIA XT) 240 MG 24 hr capsule Take 1 capsule (240 mg total) by mouth daily. 90 capsule 1  . Dulaglutide (TRULICITY) 1.5 GE/9.5MW SOPN Inject 1.5 mg into the skin once a week. 2 mL 2  . Elastic Bandages & Supports (MEDICAL COMPRESSION STOCKINGS) MISC 2 each by Does not apply route daily. 2 each 2  . fluconazole (DIFLUCAN) 150 MG tablet TAKE ONE TABLET BY MOUTH EVERY OTHER DAY AS NEEDED FOR  YEAST  INFECTION 4 tablet 0  . fluticasone (FLONASE) 50 MCG/ACT nasal spray Place 2 sprays into both nostrils daily. 48 g 1  . Fluticasone-Salmeterol (ADVAIR DISKUS) 250-50 MCG/DOSE AEPB  Inhale 1 puff into the lungs 2 (two) times daily. 1 each 3  . furosemide (LASIX) 40 MG tablet Take 1 tablet (40 mg total) by mouth 2 (two) times daily as needed. 180 tablet 1  . Insulin Lispro Prot & Lispro (HUMALOG MIX 75/25 KWIKPEN) (75-25) 100 UNIT/ML Kwikpen Inject 38-60 Units into the skin 2 (two) times daily. 15 pen 1  . Insulin Pen Needle (NOVOFINE) 30G X 8 MM MISC Inject 10 each into the skin daily. 100 each 1  . irbesartan (AVAPRO) 300 MG tablet Take 1 tablet (300 mg total) by mouth daily. 90 tablet 1  . pravastatin (PRAVACHOL) 80 MG tablet Take 1 tablet (80 mg total) by mouth every evening. 90 tablet 1    No current facility-administered medications for this visit.     Allergies:   Patient has no known allergies.    Social History:  The patient  reports that she has never smoked. She has never used smokeless tobacco. She reports that she does not drink alcohol or use drugs.   Family History:  The patient's family history includes Heart attack in her mother; Hypertension in her mother.    ROS:  Please see the history of present illness.   Otherwise, review of systems are positive for none.   All other systems are reviewed and negative.    PHYSICAL EXAM: VS:  BP (!) 154/60 (BP Location: Left Arm, Patient Position: Sitting, Cuff Size: Large)   Pulse 78   Ht 5\' 10"  (1.778 m)   Wt (!) 383 lb 8 oz (174 kg)   BMI 55.03 kg/m  , BMI Body mass index is 55.03 kg/m. GEN: Well nourished, well developed, in no acute distress  HEENT: normal  Neck: no JVD, carotid bruits, or masses Cardiac: regular rate and rhythm; no rubs, or gallops,no edema . One out of 6 systolic murmur at the base Respiratory:  clear to auscultation bilaterally, normal work of breathing GI: soft, nontender, nondistended, + BS MS: no deformity or atrophy  Skin: warm and dry, no rash Neuro:  Strength and sensation are intact Psych: euthymic mood, full affect   EKG:  EKG is ordered today. The ekg ordered today demonstrates normal sinus rhythm with low voltage and nonspecific ST and T wave changes.  Recent Labs: 12/23/2016: ALT 8; BUN 34; Creat 1.45; Hemoglobin 11.7; Platelets 288; Potassium 5.3; Sodium 140    Lipid Panel    Component Value Date/Time   CHOL 133 12/23/2016 0941   CHOL 139 04/16/2015 1138   TRIG 94 12/23/2016 0941   HDL 53 12/23/2016 0941   HDL 52 04/16/2015 1138   CHOLHDL 2.5 12/23/2016 0941   VLDL 19 12/23/2016 0941   LDLCALC 61 12/23/2016 0941   LDLCALC 70 04/16/2015 1138      Wt Readings from Last 3 Encounters:  06/02/17 (!) 383 lb 8 oz (174 kg)  03/25/17 (!) 383 lb 6.4 oz (173.9 kg)   12/30/16 (!) 383 lb 6.4 oz (173.9 kg)       No flowsheet data found.    ASSESSMENT AND PLAN:  1.   Chronic diastolic heart failure: She appears to be euvolemic on current dose of furosemide .   She is mostly taking furosemide once daily.  2. PACs:  She complains of increased palpitations and thus I elected to increase the dose of diltiazem back to 300 mg once daily.  3. Essential hypertension: Blood pressure is controlled on current medications.  4. Morbid obesity:  I again discussed  with her the importance of healthy diet and exercise.  5. Obstructive sleep apnea: Continue CPAP.   Disposition:   FU with me in 6 months  Signed,  Kathlyn Sacramento, MD  06/02/2017 10:56 AM    [place]

## 2017-06-02 NOTE — Patient Instructions (Signed)
Medication Instructions:  Your physician has recommended you make the following change in your medication:  INCREASE diltiazem to 300mg  once daily  Labwork: none  Testing/Procedures: none  Follow-Up: Your physician wants you to follow-up in: 6 months with Dr. Fletcher Anon.  You will receive a reminder letter in the mail two months in advance. If you don't receive a letter, please call our office to schedule the follow-up appointment.   Any Other Special Instructions Will Be Listed Below (If Applicable).     If you need a refill on your cardiac medications before your next appointment, please call your pharmacy.

## 2017-06-24 ENCOUNTER — Ambulatory Visit (INDEPENDENT_AMBULATORY_CARE_PROVIDER_SITE_OTHER): Payer: Medicare PPO

## 2017-06-24 DIAGNOSIS — Z23 Encounter for immunization: Secondary | ICD-10-CM

## 2017-07-07 ENCOUNTER — Other Ambulatory Visit: Payer: Self-pay | Admitting: Family Medicine

## 2017-07-07 DIAGNOSIS — I10 Essential (primary) hypertension: Secondary | ICD-10-CM

## 2017-07-07 NOTE — Telephone Encounter (Signed)
Hypertension medication request: Irbesartan 300 mg to [facility].  Last office visit pertaining to hypertension: Last visit: 03/25/2017  BP Readings from Last 3 Encounters:  06/02/17 (!) 154/60  03/25/17 131/75  12/30/16 134/66    Lab Results  Component Value Date   CREATININE 1.45 (H) 12/23/2016   BUN 34 (H) 12/23/2016   NA 140 12/23/2016   K 5.3 12/23/2016   CL 104 12/23/2016   CO2 22 12/23/2016     Next visit is on 07/27/2017.

## 2017-07-27 ENCOUNTER — Ambulatory Visit: Payer: Medicare PPO | Admitting: Family Medicine

## 2017-07-27 ENCOUNTER — Encounter: Payer: Self-pay | Admitting: Family Medicine

## 2017-07-27 VITALS — BP 160/54 | HR 72 | Resp 14 | Ht 69.0 in | Wt 384.9 lb

## 2017-07-27 DIAGNOSIS — N183 Chronic kidney disease, stage 3 unspecified: Secondary | ICD-10-CM

## 2017-07-27 DIAGNOSIS — R809 Proteinuria, unspecified: Secondary | ICD-10-CM

## 2017-07-27 DIAGNOSIS — Z794 Long term (current) use of insulin: Secondary | ICD-10-CM

## 2017-07-27 DIAGNOSIS — E782 Mixed hyperlipidemia: Secondary | ICD-10-CM | POA: Diagnosis not present

## 2017-07-27 DIAGNOSIS — I110 Hypertensive heart disease with heart failure: Secondary | ICD-10-CM

## 2017-07-27 DIAGNOSIS — I272 Pulmonary hypertension, unspecified: Secondary | ICD-10-CM

## 2017-07-27 DIAGNOSIS — G4733 Obstructive sleep apnea (adult) (pediatric): Secondary | ICD-10-CM

## 2017-07-27 DIAGNOSIS — I700 Atherosclerosis of aorta: Secondary | ICD-10-CM | POA: Diagnosis not present

## 2017-07-27 DIAGNOSIS — J4540 Moderate persistent asthma, uncomplicated: Secondary | ICD-10-CM

## 2017-07-27 DIAGNOSIS — E1129 Type 2 diabetes mellitus with other diabetic kidney complication: Secondary | ICD-10-CM

## 2017-07-27 DIAGNOSIS — I10 Essential (primary) hypertension: Secondary | ICD-10-CM

## 2017-07-27 MED ORDER — INSULIN PEN NEEDLE 30G X 8 MM MISC
1.0000 | Freq: Every day | 1 refills | Status: DC
Start: 2017-07-27 — End: 2017-11-25

## 2017-07-27 MED ORDER — FLUCONAZOLE 150 MG PO TABS: ORAL_TABLET | ORAL | 0 refills | Status: DC

## 2017-07-27 MED ORDER — DULAGLUTIDE 1.5 MG/0.5ML ~~LOC~~ SOAJ: 1.5000 mg | SUBCUTANEOUS | 2 refills | Status: DC

## 2017-07-27 MED ORDER — FLUTICASONE-SALMETEROL 250-50 MCG/DOSE IN AEPB: 1.0000 | INHALATION_SPRAY | Freq: Two times a day (BID) | RESPIRATORY_TRACT | 3 refills | Status: DC

## 2017-07-27 MED ORDER — IRBESARTAN 300 MG PO TABS: 300.0000 mg | ORAL_TABLET | Freq: Every day | ORAL | 0 refills | Status: DC

## 2017-07-27 MED ORDER — INSULIN LISPRO PROT & LISPRO (75-25 MIX) 100 UNIT/ML KWIKPEN: 38.0000 [IU] | PEN_INJECTOR | Freq: Two times a day (BID) | SUBCUTANEOUS | 1 refills | Status: DC

## 2017-07-27 NOTE — Addendum Note (Signed)
Addended by: Inda Coke on: 07/27/2017 08:28 AM   Modules accepted: Orders

## 2017-07-27 NOTE — Progress Notes (Signed)
Name: Amber Reyes   MRN: [id number]    DOB: 10-Aug-1946   Date:07/27/2017       Progress Note  Subjective  Chief Complaint  Chief Complaint  Patient presents with  . Diabetes  . Hypertension  . Hyperlipidemia  . Obesity    HPI  DMII controlled: She has been compliant with medication, on Humalog 75/25 since Feb 2018, also started on Trulicity in 9326 and [id number] has been at goal. FSB at home has been fasting low 81-127 and 140's-150's post-prandially. She checks it daily. Taking Avapro for nephropathy- urine micro was normal in Dec, but up to 19 Jan 2017.Marland Kitchen No polyphagia, polydipsia or polyuria. No recent episodes of hypoglycemia. She is in the doughnut hole now and needs helps with her medication. Discussed [place] or dougnut hole program  HTN/CHF: taking medications and denies side effects of medications, bp is at goal, she denies chest pain no recent palpitation. At home bp below 132-134 , She has orthopnea, SOB is stable, and edema has been stable.   Hyperlipidemia: taking Pravastatin, last LDL was at goal, we will continue current medication   RLS: symptoms are still present, described as soreness on both legs at night, improves when she rubs her legs. She stopped taking Requip but states symptoms are not any worse   CKIII: she is going to see nephrologist next month. Denies pruritus or decrease in urinary volume. Last GFR 44 October 2018 at nephrologist office, last urine micro was 30   OSA: she has been wearing CPAP machine every night now, better compliance since last visit. Advised to wear it every night, she has mid pulmonary hypertension also , found on echo done in 2015 by Dr Fletcher Anon   Morbid Obesity: her weight is stable. She is eating better now but still not active , she went to a bariatric surgery consultation, but afraid to have surgery, she states her daughter had the surgery but she is still scared. .   Asthma Moderate persistent: one flare back in Feb 2018  and  recurrent symptom secondary to URI back in May and had to take short course of prednisone. She states symptoms are controlled at this time, occasional cough and still has SOB with activity, but no wheezing. She is compliant and uses Advair twice daily as prescribed, no side effects.   PAC: on Cardizem CD seen by Dr. Fletcher Anon and on 300mg  now, no recent  Palpitation with higher dose, she  SOB with mild activity, no chest pain, taking aspirin but down to 81 mg daily . She was given Eliquis back in 2015 but it was too expensive and no events since. She is not interested in anti-coagulation, discussed increased risk of strokes. .Seen by Dr. Fletcher Anon in 2015 and again in 2017 after admission for CHF. His last note does not mention Afib, but was discussed in the past, he states she has PAC's   Patient Active Problem List   Diagnosis Date Noted  . Thoracic aortic atherosclerosis ([place]) 12/30/2016  . Uncontrolled type 2 diabetes mellitus with stage 3 chronic kidney disease, with long-term current use of insulin ([place]) 12/23/2016  . Acute on chronic diastolic congestive heart failure ([place]) 05/21/2016  . Venous insufficiency 05/21/2016  . Hypertensive heart disease 05/21/2016  . Anemia in chronic kidney disease 12/22/2015  . Pulmonary hypertension ([employer]) 12/18/2015  . 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 Anxiety 04/12/2015  . Chronic kidney disease (CKD), stage III (moderate) ([place]) 04/12/2015  . Edema extremities 04/12/2015  . Disc disorder  of lumbar region 04/12/2015  . Asthma, moderate persistent 04/12/2015  . Super obesity 04/12/2015  . Obstructive apnea 04/12/2015  . Restless leg 04/12/2015  . Allergic rhinitis 04/12/2015  . Vitamin D deficiency 04/12/2015  . Controlled gout 04/12/2015  . Premature atrial contractions 11/29/2013  . Benign essential hypertension   . Hyperlipidemia     Past Surgical History:  Procedure Laterality Date  . CARDIAC CATHETERIZATION  2002   [facility]  . PERICARDIUM SURGERY       Family History  Problem Relation Age of Onset  . Heart attack Mother   . Hypertension Mother     Social History   Socioeconomic History  . Marital status: Married    Spouse name: Not on file  . Number of children: Not on file  . Years of education: Not on file  . Highest education level: Not on file  Social Needs  . Financial resource strain: Not on file  . Food insecurity - worry: Not on file  . Food insecurity - inability: Not on file  . Transportation needs - medical: Not on file  . Transportation needs - non-medical: Not on file  Occupational History  . Occupation: retired  Tobacco Use  . Smoking status: Never Smoker  . Smokeless tobacco: Never Used  . Tobacco comment: n/a  Substance and Sexual Activity  . Alcohol use: No    Alcohol/week: 0.0 oz  . Drug use: No  . Sexual activity: Not Currently  Other Topics Concern  . Not on file  Social History Narrative  . Not on file     Current Outpatient Medications:  .  albuterol (PROVENTIL HFA;VENTOLIN HFA) 108 (90 BASE) MCG/ACT inhaler, Inhale 2 puffs into the lungs every 6 (six) hours as needed for wheezing or shortness of breath., Disp: , Rfl:  .  allopurinol (ZYLOPRIM) 100 MG tablet, Take 1 tablet (100 mg total) by mouth 2 (two) times daily., Disp: 180 tablet, Rfl: 1 .  aspirin EC 81 MG tablet, Take 81 mg by mouth daily., Disp: , Rfl:  .  Cholecalciferol (VITAMIN D) 2000 UNITS tablet, Take 2,000 Units by mouth daily., Disp: , Rfl:  .  COLCRYS 0.6 MG tablet, TAKE ONE TABLET BY MOUTH TWICE DAILY AS NEEDED FOR GOUT, Disp: 30 tablet, Rfl: 5 .  diltiazem (CARDIZEM CD) 300 MG 24 hr capsule, Take 1 capsule (300 mg total) by mouth daily., Disp: 90 capsule, Rfl: 3 .  Dulaglutide (TRULICITY) 1.5 DQ/2.2WL SOPN, Inject 1.5 mg into the skin once a week., Disp: 2 mL, Rfl: 2 .  Elastic Bandages & Supports (MEDICAL COMPRESSION STOCKINGS) MISC, 2 each by Does not apply route daily., Disp: 2 each, Rfl: 2 .  fluconazole (DIFLUCAN)  150 MG tablet, TAKE ONE TABLET BY MOUTH EVERY OTHER DAY AS NEEDED FOR  YEAST  INFECTION, Disp: 4 tablet, Rfl: 0 .  fluticasone (FLONASE) 50 MCG/ACT nasal spray, Place 2 sprays into both nostrils daily., Disp: 48 g, Rfl: 1 .  Fluticasone-Salmeterol (ADVAIR DISKUS) 250-50 MCG/DOSE AEPB, Inhale 1 puff into the lungs 2 (two) times daily., Disp: 1 each, Rfl: 3 .  furosemide (LASIX) 40 MG tablet, Take 1 tablet (40 mg total) by mouth 2 (two) times daily as needed., Disp: 180 tablet, Rfl: 1 .  Insulin Lispro Prot & Lispro (HUMALOG MIX 75/25 KWIKPEN) (75-25) 100 UNIT/ML Kwikpen, Inject 38-60 Units into the skin 2 (two) times daily., Disp: 15 pen, Rfl: 1 .  Insulin Pen Needle (NOVOFINE) 30G X 8 MM MISC, Inject 10  each into the skin daily., Disp: 100 each, Rfl: 1 .  irbesartan (AVAPRO) 300 MG tablet, TAKE 1 TABLET BY MOUTH ONCE DAILY, Disp: 90 tablet, Rfl: 0 .  pravastatin (PRAVACHOL) 80 MG tablet, Take 1 tablet (80 mg total) by mouth every evening., Disp: 90 tablet, Rfl: 1  No Known Allergies   ROS  Constitutional: Negative for fever or weight change.  Respiratory: Negative for cough and shortness of breath.   Cardiovascular: Negative for chest pain or palpitations.  Gastrointestinal: Negative for abdominal pain, no bowel changes.  Musculoskeletal: Negative for gait problem or joint swelling.  Skin: Negative for rash.  Neurological: Negative for dizziness or headache.  No other specific complaints in a complete review of systems (except as listed in HPI above).  Objective  Vitals:   07/27/17 0754  BP: (!) 160/54  Pulse: 72  Resp: 14  SpO2: 99%  Weight: (!) 384 lb 14.4 oz (174.6 kg)  Height: 5\' 9"  (1.753 m)    Body mass index is 56.84 kg/m.  Physical Exam  Constitutional: Patient appears well-developed and well-nourished. Obese  No distress.  HEENT: head atraumatic, normocephalic, pupils equal and reactive to light,  neck supple, throat within normal limits Cardiovascular: Normal rate,  regular rhythm and normal heart sounds.  No murmur heard. Trace  BLE edema. Pulmonary/Chest: Effort normal and breath sounds normal. No respiratory distress. Abdominal: Soft.  There is no tenderness. Psychiatric: Patient has a normal mood and affect. behavior is normal. Judgment and thought content normal.   PHQ2/9: Depression screen [facility] 2/9 03/25/2017 12/30/2016 09/28/2016 08/25/2016 06/04/2016  Decreased Interest 0 0 0 0 0  Down, Depressed, Hopeless 0 0 0 0 0  PHQ - 2 Score 0 0 0 0 0    Fall Risk: Fall Risk  07/27/2017 03/25/2017 12/30/2016 09/28/2016 08/25/2016  Falls in the past year? No No No No No    Functional Status Survey: Does the patient have difficulty seeing, even when wearing glasses/contacts?: No Does the patient have difficulty concentrating, remembering, or making decisions?: No Does the patient have difficulty walking or climbing stairs?: Yes Does the patient have difficulty dressing or bathing?: No Does the patient have difficulty doing errands alone such as visiting a doctor's office or shopping?: No   Assessment & Plan  1. Controlled type 2 diabetes mellitus with microalbuminuria, with long-term current use of insulin (HCC)  - Hemoglobin A1c - Dulaglutide (TRULICITY) 1.5 WU/9.8JX SOPN; Inject 1.5 mg into the skin once a week.  Dispense: 2 mL; Refill: 2 - Insulin Lispro Prot & Lispro (HUMALOG MIX 75/25 KWIKPEN) (75-25) 100 UNIT/ML Kwikpen; Inject 38-60 Units into the skin 2 (two) times daily.  Dispense: 15 pen; Refill: 1  2. Thoracic aortic atherosclerosis ([place])  On statin therapy and aspirin daily  3. Moderate persistent asthma without complication  - Fluticasone-Salmeterol (ADVAIR DISKUS) 250-50 MCG/DOSE AEPB; Inhale 1 puff into the lungs 2 (two) times daily.  Dispense: 1 each; Refill: 3  4. Benign essential hypertension  Seen by Dr. Fletcher Anon, back on higher dose of Cardizem and tolerating it well - irbesartan (AVAPRO) 300 MG tablet; Take 1 tablet (300 mg total) by  mouth daily.  Dispense: 90 tablet; Refill: 0  5. Mixed hyperlipidemia  On statin therapy   6. Obstructive apnea  She wears machine every night  7. Hypertensive heart disease with heart failure (HCC)  Sees Dr. Fletcher Anon, stable at this time, no excacerbation   8. Morbid obesity, unspecified obesity type [facility])  Discussed with the patient  the risk posed by an increased BMI. Discussed importance of portion control, calorie counting and at least 150 minutes of physical activity weekly. Avoid sweet beverages and drink more water. Eat at least 6 servings of fruit and vegetables daily   9. Chronic kidney disease (CKD), stage III (moderate) (HCC)  Goes to [facility]  10. Pulmonary hypertension (HCC)  - Insulin Pen Needle (NOVOFINE) 30G X 8 MM MISC; Inject 10 each into the skin daily.  Dispense: 100 each; Refill: 1

## 2017-07-28 LAB — HEMOGLOBIN A1C
EAG (MMOL/L): 7.7 (calc)
HEMOGLOBIN A1C: 6.5 %{Hb} — AB (ref <5.7)
MEAN PLASMA GLUCOSE: 140 (calc)

## 2017-08-24 ENCOUNTER — Ambulatory Visit: Payer: Medicare PPO | Admitting: Family Medicine

## 2017-08-24 ENCOUNTER — Encounter: Payer: Self-pay | Admitting: Family Medicine

## 2017-08-24 VITALS — BP 162/64 | HR 90 | Temp 98.8°F | Resp 18 | Wt 386.8 lb

## 2017-08-24 DIAGNOSIS — R05 Cough: Secondary | ICD-10-CM | POA: Diagnosis not present

## 2017-08-24 DIAGNOSIS — IMO0002 Reserved for concepts with insufficient information to code with codable children: Secondary | ICD-10-CM

## 2017-08-24 DIAGNOSIS — R509 Fever, unspecified: Secondary | ICD-10-CM | POA: Diagnosis not present

## 2017-08-24 DIAGNOSIS — J4541 Moderate persistent asthma with (acute) exacerbation: Secondary | ICD-10-CM | POA: Diagnosis not present

## 2017-08-24 DIAGNOSIS — N183 Chronic kidney disease, stage 3 (moderate): Secondary | ICD-10-CM | POA: Diagnosis not present

## 2017-08-24 DIAGNOSIS — E1165 Type 2 diabetes mellitus with hyperglycemia: Secondary | ICD-10-CM

## 2017-08-24 DIAGNOSIS — I5033 Acute on chronic diastolic (congestive) heart failure: Secondary | ICD-10-CM | POA: Diagnosis not present

## 2017-08-24 DIAGNOSIS — Z794 Long term (current) use of insulin: Secondary | ICD-10-CM | POA: Diagnosis not present

## 2017-08-24 DIAGNOSIS — E1122 Type 2 diabetes mellitus with diabetic chronic kidney disease: Secondary | ICD-10-CM

## 2017-08-24 DIAGNOSIS — R059 Cough, unspecified: Secondary | ICD-10-CM

## 2017-08-24 LAB — POCT INFLUENZA A/B
Influenza A, POC: NEGATIVE
Influenza B, POC: NEGATIVE

## 2017-08-24 MED ORDER — AZITHROMYCIN 250 MG PO TABS: ORAL_TABLET | ORAL | 0 refills | Status: DC

## 2017-08-24 MED ORDER — HYDROCOD POLST-CPM POLST ER 10-8 MG/5ML PO SUER: 5.0000 mL | Freq: Every evening | ORAL | 0 refills | Status: DC | PRN

## 2017-08-24 NOTE — Progress Notes (Signed)
Name: Amber Reyes   MRN: [id number]    DOB: 1945-10-01   Date:08/24/2017       Progress Note  Subjective  Chief Complaint  Chief Complaint  Patient presents with  . URI    onset several days, symptoms include: fever, vomiting, cough, congestion, sore throat, SOB, wheezing and headache    HPI  Pt presents with 2 day history of significant productive cough with side pain and 1 episodes of emesis on coughing, headache, fever of 100.1 at home, fatigue, some wheezing.   Denies body aches, nausea/diarrhea/constipation, chest pain, shortness of breath.  Tried Mucinex DM - and this helped to break up the chest congestion.  Took Tylenol for fever and this helped.  Used albuterol inhaler twice yesterday and it helped both times.  Taking Advair daily as prescribed.  Negative for influenza in office today. Using flonase daily.  Pertinent History: CHF: No increased swelling in the legs, no shortness of breath. T2DM: Running 120-130's. OSA: Uses Bipap nightly.  Patient Active Problem List   Diagnosis Date Noted  . Thoracic aortic atherosclerosis ([place]) 12/30/2016  . Uncontrolled type 2 diabetes mellitus with stage 3 chronic kidney disease, with long-term current use of insulin ([place]) 12/23/2016  . Acute on chronic diastolic congestive heart failure ([place]) 05/21/2016  . Venous insufficiency 05/21/2016  . Hypertensive heart disease 05/21/2016  . Anemia in chronic kidney disease 12/22/2015  . Pulmonary hypertension ([place]) 12/18/2015  . 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 Anxiety 04/12/2015  . Chronic kidney disease (CKD), stage III (moderate) ([place]) 04/12/2015  . Edema extremities 04/12/2015  . 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 Asthma, moderate persistent 04/12/2015  . Super obesity 04/12/2015  . Obstructive apnea 04/12/2015  . Restless leg 04/12/2015  . Allergic rhinitis 04/12/2015  . Vitamin D deficiency 04/12/2015  . Controlled gout 04/12/2015  . Premature atrial contractions  11/29/2013  . Benign essential hypertension   . Hyperlipidemia     Social History   Tobacco Use  . Smoking status: Never Smoker  . Smokeless tobacco: Never Used  . Tobacco comment: n/a  Substance Use Topics  . Alcohol use: No    Alcohol/week: 0.0 oz     Current Outpatient Medications:  .  albuterol (PROVENTIL HFA;VENTOLIN HFA) 108 (90 BASE) MCG/ACT inhaler, Inhale 2 puffs into the lungs every 6 (six) hours as needed for wheezing or shortness of breath., Disp: , Rfl:  .  allopurinol (ZYLOPRIM) 100 MG tablet, Take 1 tablet (100 mg total) by mouth 2 (two) times daily., Disp: 180 tablet, Rfl: 1 .  aspirin EC 81 MG tablet, Take 81 mg by mouth daily., Disp: , Rfl:  .  Cholecalciferol (VITAMIN D) 2000 UNITS tablet, Take 2,000 Units by mouth daily., Disp: , Rfl:  .  COLCRYS 0.6 MG tablet, TAKE ONE TABLET BY MOUTH TWICE DAILY AS NEEDED FOR GOUT, Disp: 30 tablet, Rfl: 5 .  diltiazem (CARDIZEM CD) 300 MG 24 hr capsule, Take 1 capsule (300 mg total) by mouth daily., Disp: 90 capsule, Rfl: 3 .  Dulaglutide (TRULICITY) 1.5 EP/3.2RJ SOPN, Inject 1.5 mg into the skin once a week., Disp: 2 mL, Rfl: 2 .  fluconazole (DIFLUCAN) 150 MG tablet, TAKE ONE TABLET BY MOUTH EVERY OTHER DAY AS NEEDED FOR  YEAST  INFECTION, Disp: 4 tablet, Rfl: 0 .  fluticasone (FLONASE) 50 MCG/ACT nasal spray, Place 2 sprays into both nostrils daily., Disp: 48 g, Rfl: 1 .  Fluticasone-Salmeterol (ADVAIR DISKUS) 250-50 MCG/DOSE AEPB, Inhale 1 puff into the  lungs 2 (two) times daily., Disp: 1 each, Rfl: 3 .  furosemide (LASIX) 40 MG tablet, Take 1 tablet (40 mg total) by mouth 2 (two) times daily as needed., Disp: 180 tablet, Rfl: 1 .  Insulin Lispro Prot & Lispro (HUMALOG MIX 75/25 KWIKPEN) (75-25) 100 UNIT/ML Kwikpen, Inject 38-60 Units into the skin 2 (two) times daily., Disp: 15 pen, Rfl: 1 .  irbesartan (AVAPRO) 300 MG tablet, Take 1 tablet (300 mg total) by mouth daily., Disp: 90 tablet, Rfl: 0 .  pravastatin (PRAVACHOL) 80  MG tablet, Take 1 tablet (80 mg total) by mouth every evening., Disp: 90 tablet, Rfl: 1 .  azithromycin (ZITHROMAX) 250 MG tablet, Day 1: Take 2 tabs; Days 2-5: Take 1 tab daily, Disp: 6 tablet, Rfl: 0 .  chlorpheniramine-HYDROcodone (TUSSIONEX PENNKINETIC ER) 10-8 MG/5ML SUER, Take 5 mLs by mouth at bedtime as needed for cough., Disp: 115 mL, Rfl: 0 .  Elastic Bandages & Supports (MEDICAL COMPRESSION STOCKINGS) MISC, 2 each by Does not apply route daily., Disp: 2 each, Rfl: 2 .  Insulin Pen Needle (NOVOFINE) 30G X 8 MM MISC, Inject 10 each into the skin daily., Disp: 100 each, Rfl: 1  No Known Allergies  ROS  Ten systems reviewed and is negative except as mentioned in HPI.  Objective  Vitals:   08/24/17 0944  BP: (!) 162/64  Pulse: 90  Resp: 18  Temp: 98.8 F (37.1 C)  TempSrc: Oral  SpO2: 99%  Weight: (!) 386 lb 12.8 oz (175.5 kg)   Body mass index is 57.12 kg/m.  Nursing Note and Vital Signs reviewed.  Physical Exam  Constitutional: Patient appears well-developed and well-nourished. Obese. No distress.  HEENT: head atraumatic, normocephalic, pupils equal and reactive to light, EOM's intact, TM's without erythema or bulging, no maxillary or frontal sinus pain on palpation, neck supple without lymphadenopathy, oropharynx pink and moist without exudate Cardiovascular: Normal rate, regular rhythm, S1/S2 present.  No murmur or rub heard. RLE with +1 pitting edema (chronic for patient). Pulmonary/Chest: Effort normal and breath sounds slightly diminished and coarse throughout - no rhonchi or wheezes. No respiratory distress or retractions. Psychiatric: Patient has a normal mood and affect. behavior is normal. Judgment and thought content normal.  Recent Results (from the past 2160 hour(s))  Hemoglobin A1c     Status: Abnormal   Collection Time: 07/27/17  8:39 AM  Result Value Ref Range   Hgb A1c MFr Bld 6.5 (H) <5.7 % of total Hgb    Comment: For someone without known  diabetes, a hemoglobin A1c value of 6.5% or greater indicates that they may have  diabetes and this should be confirmed with a follow-up  test. . For someone with known diabetes, a value <7% indicates  that their diabetes is well controlled and a value  greater than or equal to 7% indicates suboptimal  control. A1c targets should be individualized based on  duration of diabetes, age, comorbid conditions, and  other considerations. . Currently, no consensus exists regarding use of hemoglobin A1c for diagnosis of diabetes for children. .    Mean Plasma Glucose 140 (calc)   eAG (mmol/L) 7.7 (calc)  POCT Influenza A/B     Status: Normal   Collection Time: 08/24/17 10:11 AM  Result Value Ref Range   Influenza A, POC Negative Negative   Influenza B, POC Negative Negative     Assessment & Plan  1. Moderate persistent asthma with acute exacerbation - azithromycin (ZITHROMAX) 250 MG tablet; Day 1:  Take 2 tabs; Days 2-5: Take 1 tab daily  Dispense: 6 tablet; Refill: 0 Continue Mucinex as needed for chest congestion. Continue your inhalers as prescribed, and flonase daily. Take Tylenol as needed for fevers or pain.  2. Fever, unspecified fever cause - POCT Influenza A/B - Negative  3. Uncontrolled type 2 diabetes mellitus with stage 3 chronic kidney disease, with long-term current use of insulin (HCC) Stable - we will treat empirically but avoid prednisone today to avoid elevation in BG.  4. Acute on chronic diastolic congestive heart failure (HCC) No changes in BLE edema, no DOE; do not suspect CHF exaberbation at this time, but discussed strict return precautions.  5. Cough - Lengthy discussion regarding use and risks of Tussionex, pt verbalizes understanding.  She has tried several other cough medications in the past that have not worked for her.  Will continue BiPAP at night. - chlorpheniramine-HYDROcodone (TUSSIONEX PENNKINETIC ER) 10-8 MG/5ML SUER; Take 5 mLs by mouth at  bedtime as needed for cough.  Dispense: 115 mL; Refill: 0  -Red flags and when to present for emergency care or RTC including fever >101.76F, chest pain, shortness of breath, new/worsening/un-resolving symptoms, reviewed with patient at time of visit. Follow up and care instructions discussed and provided in AVS.

## 2017-08-24 NOTE — Patient Instructions (Addendum)
Continue Mucinex as needed for chest congestion. Continue your inhalers as prescribed, and flonase daily. Take Tylenol as needed for fevers or pain. Cool Mist Vaporizer A cool mist vaporizer is a device that releases a cool mist into the air. If you have a cough or a cold, using a vaporizer may help relieve your symptoms. The mist adds moisture to the air, which may help thin your mucus and make it less sticky. When your mucus is thin and less sticky, it easier for you to breathe and to cough up secretions. Do not use a vaporizer if you are allergic to mold. Follow these instructions at home:  Follow the instructions that come with the vaporizer.  Do not use anything other than distilled water in the vaporizer.  Do not run the vaporizer all of the time. Doing that can cause mold or bacteria to grow in the vaporizer.  Clean the vaporizer after each time that you use it.  Clean and dry the vaporizer well before storing it.  Stop using the vaporizer if your breathing symptoms get worse. This information is not intended to replace advice given to you by your health care provider. Make sure you discuss any questions you have with your health care provider. Document Released: 05/06/2004 Document Revised: 02/27/2016 Document Reviewed: 11/08/2015 Elsevier Interactive Patient Education  [employer].

## 2017-09-01 ENCOUNTER — Telehealth: Payer: Self-pay | Admitting: Family Medicine

## 2017-09-01 DIAGNOSIS — R05 Cough: Secondary | ICD-10-CM

## 2017-09-01 DIAGNOSIS — R059 Cough, unspecified: Secondary | ICD-10-CM

## 2017-09-01 MED ORDER — HYDROCOD POLST-CPM POLST ER 10-8 MG/5ML PO SUER: 5.0000 mL | Freq: Every evening | ORAL | 0 refills | Status: DC | PRN

## 2017-09-01 NOTE — Telephone Encounter (Signed)
Please call pharmacy - if patient only filled 66mL from my initial Rx on 08/24/17, then I can send in another Rx for 65ml.

## 2017-09-01 NOTE — Telephone Encounter (Signed)
Spoke to [name], she only got the 35 ml. Can come back to pick up new script if needed

## 2017-09-01 NOTE — Telephone Encounter (Signed)
Pt called in to give provider an update on how she is doing after completing antibiotic and Muxinex. Pt says that she is still horse (voice gone) pt says that she still have cough. Pt would like to know if provider could fill T[place] for 35 ML per pharmacy, they can only fill 35 ML.   Please advise   CB: [phone]

## 2017-09-01 NOTE — Telephone Encounter (Signed)
Rx printed and at front desk. Please notify patient.

## 2017-09-01 NOTE — Telephone Encounter (Signed)
Pt.notified

## 2017-09-01 NOTE — Telephone Encounter (Signed)
Pt called in to give provider an update on how she is doing after completing antibiotic and Muxinex. Pt says that she is still horse (voice gone) pt says that she still have cough. Pt would like to know if provider could fill Tusson for 35 ML per pharmacy, they can only fill 35 ML.   Please advise   CB: [phone]

## 2017-09-02 ENCOUNTER — Encounter: Payer: Self-pay | Admitting: Family Medicine

## 2017-09-02 DIAGNOSIS — E119 Type 2 diabetes mellitus without complications: Secondary | ICD-10-CM | POA: Diagnosis not present

## 2017-09-02 LAB — HM DIABETES EYE EXAM

## 2017-09-12 ENCOUNTER — Other Ambulatory Visit: Payer: Self-pay | Admitting: Family Medicine

## 2017-09-12 DIAGNOSIS — E782 Mixed hyperlipidemia: Secondary | ICD-10-CM

## 2017-09-26 ENCOUNTER — Other Ambulatory Visit: Payer: Self-pay | Admitting: Family Medicine

## 2017-09-26 DIAGNOSIS — Z1231 Encounter for screening mammogram for malignant neoplasm of breast: Secondary | ICD-10-CM

## 2017-10-24 ENCOUNTER — Ambulatory Visit
Admission: RE | Admit: 2017-10-24 | Discharge: 2017-10-24 | Disposition: A | Discharge status: Home / Self Care | Payer: Medicare PPO | Source: Ambulatory Visit | Attending: Family Medicine | Admitting: Family Medicine

## 2017-10-24 DIAGNOSIS — Z1231 Encounter for screening mammogram for malignant neoplasm of breast: Secondary | ICD-10-CM | POA: Diagnosis not present

## 2017-11-21 DIAGNOSIS — M109 Gout, unspecified: Secondary | ICD-10-CM | POA: Diagnosis not present

## 2017-11-21 DIAGNOSIS — I110 Hypertensive heart disease with heart failure: Secondary | ICD-10-CM | POA: Diagnosis not present

## 2017-11-21 DIAGNOSIS — N183 Chronic kidney disease, stage 3 (moderate): Secondary | ICD-10-CM | POA: Diagnosis not present

## 2017-11-21 DIAGNOSIS — E559 Vitamin D deficiency, unspecified: Secondary | ICD-10-CM | POA: Diagnosis not present

## 2017-11-25 ENCOUNTER — Telehealth: Payer: Self-pay

## 2017-11-25 ENCOUNTER — Encounter: Payer: Self-pay | Admitting: Family Medicine

## 2017-11-25 ENCOUNTER — Ambulatory Visit: Payer: Medicare PPO | Admitting: Family Medicine

## 2017-11-25 VITALS — BP 134/50 | HR 82 | Resp 16 | Ht 69.0 in | Wt 390.0 lb

## 2017-11-25 DIAGNOSIS — M109 Gout, unspecified: Secondary | ICD-10-CM | POA: Diagnosis not present

## 2017-11-25 DIAGNOSIS — E1129 Type 2 diabetes mellitus with other diabetic kidney complication: Secondary | ICD-10-CM

## 2017-11-25 DIAGNOSIS — D72829 Elevated white blood cell count, unspecified: Secondary | ICD-10-CM

## 2017-11-25 DIAGNOSIS — I110 Hypertensive heart disease with heart failure: Secondary | ICD-10-CM

## 2017-11-25 DIAGNOSIS — I700 Atherosclerosis of aorta: Secondary | ICD-10-CM | POA: Diagnosis not present

## 2017-11-25 DIAGNOSIS — I10 Essential (primary) hypertension: Secondary | ICD-10-CM | POA: Diagnosis not present

## 2017-11-25 DIAGNOSIS — Z794 Long term (current) use of insulin: Secondary | ICD-10-CM | POA: Diagnosis not present

## 2017-11-25 DIAGNOSIS — D649 Anemia, unspecified: Secondary | ICD-10-CM | POA: Diagnosis not present

## 2017-11-25 DIAGNOSIS — N183 Chronic kidney disease, stage 3 unspecified: Secondary | ICD-10-CM

## 2017-11-25 DIAGNOSIS — E782 Mixed hyperlipidemia: Secondary | ICD-10-CM | POA: Diagnosis not present

## 2017-11-25 DIAGNOSIS — E1122 Type 2 diabetes mellitus with diabetic chronic kidney disease: Secondary | ICD-10-CM | POA: Diagnosis not present

## 2017-11-25 DIAGNOSIS — H0260 Xanthelasma of unspecified eye, unspecified eyelid: Secondary | ICD-10-CM

## 2017-11-25 DIAGNOSIS — J4540 Moderate persistent asthma, uncomplicated: Secondary | ICD-10-CM | POA: Diagnosis not present

## 2017-11-25 DIAGNOSIS — R809 Proteinuria, unspecified: Secondary | ICD-10-CM

## 2017-11-25 LAB — POCT GLYCOSYLATED HEMOGLOBIN (HGB A1C): HEMOGLOBIN A1C: 6.8

## 2017-11-25 MED ORDER — FUROSEMIDE 40 MG PO TABS: 40.0000 mg | ORAL_TABLET | Freq: Two times a day (BID) | ORAL | 1 refills | Status: DC | PRN

## 2017-11-25 MED ORDER — COLCHICINE 0.6 MG PO TABS: 0.6000 mg | ORAL_TABLET | Freq: Every day | ORAL | 0 refills | Status: AC | PRN

## 2017-11-25 MED ORDER — IRBESARTAN 300 MG PO TABS: 300.0000 mg | ORAL_TABLET | Freq: Every day | ORAL | 1 refills | Status: DC

## 2017-11-25 MED ORDER — PRAVASTATIN SODIUM 80 MG PO TABS: 80.0000 mg | ORAL_TABLET | Freq: Every evening | ORAL | 1 refills | Status: DC

## 2017-11-25 MED ORDER — FLUCONAZOLE 150 MG PO TABS: ORAL_TABLET | ORAL | 0 refills | Status: DC

## 2017-11-25 MED ORDER — FLUTICASONE-SALMETEROL 250-50 MCG/DOSE IN AEPB: 1.0000 | INHALATION_SPRAY | Freq: Two times a day (BID) | RESPIRATORY_TRACT | 1 refills | Status: DC

## 2017-11-25 MED ORDER — DULAGLUTIDE 1.5 MG/0.5ML ~~LOC~~ SOAJ: 1.5000 mg | SUBCUTANEOUS | 1 refills | Status: DC

## 2017-11-25 MED ORDER — INSULIN PEN NEEDLE 30G X 8 MM MISC: 1.0000 | Freq: Every day | 1 refills | Status: DC

## 2017-11-25 MED ORDER — INSULIN LISPRO PROT & LISPRO (75-25 MIX) 100 UNIT/ML KWIKPEN: 38.0000 [IU] | PEN_INJECTOR | Freq: Two times a day (BID) | SUBCUTANEOUS | 1 refills | Status: DC

## 2017-11-25 MED ORDER — ALLOPURINOL 100 MG PO TABS: 100.0000 mg | ORAL_TABLET | Freq: Two times a day (BID) | ORAL | 1 refills | Status: DC

## 2017-11-25 NOTE — Progress Notes (Signed)
Name: Amber Reyes   MRN: [id number]    DOB: [birth year]   Date:11/25/2017       Progress Note  Subjective  Chief Complaint  Chief Complaint  Patient presents with  . Diabetes  . Hypertension    HPI   DMII controlled: She has been compliant with medication, on Humalog 75/25 since Feb 2018, also started on Trulicity in 4431 and [id number] has been at goal. FSB at home has been fasting 120's, before dinner down to 85-90 , but not  checking post-prandially lately.  Taking Avapro for nephropathy- urine micro was normal in Dec, but up to 19 Jan 2017.Marland Kitchen No polyphagia, polydipsia or polyuria. She has seldom episodes of hypoglycemia, usually happens when she skips a meal, advised to not go too long without food.  Discussed yearly eye exam  HTN/CHF: taking medications and denies side effects of medications, bp is at goal, she denies chest pain no recent palpitation. At home bp 135/60's-70's/  She has orthopnea, SOB is stable, and edema has been stable. , Echo done 04/2016 LV function is normal with grade 1 diastolic dysfunction and mildly dilated atrium  Hyperlipidemia: taking Pravastatin, last LDL was at goal, we will continue current medication , due for repeat labs today   RLS: symptoms are still present, described as soreness on both legs at night, improves when she rubs her legs. She stopped taking Requip but unchanged symptoms   CKIII: she is going to see nephrologist at [facility], last labs reviewed still stage III CKI with mild drop of GFR , no itching , good urine output  OSA: she has been wearing CPAP machine every night now, better compliance since last visit. Advised to wear it every night, she has mid pulmonary hypertension also , found on echo done in 2015 by Dr Fletcher Anon   Morbid Obesity: her weight is stable. She is eating better now but still not active , she went to a bariatric surgery consultation, but afraid to have surgery, she states her daughter had the surgery and is doing well,  but she is personally scared of having surgery   Asthma Moderate persistent: one flare back in Feb 2018 .  She states symptoms are controlled at this time, occasional cough and still has SOB with activity, but no wheezing. She is compliant and uses Advair twice daily as prescribed, no side effects. Uses rescue inhaler very seldom  PAC: on Cardizem CD seen by Dr. Fletcher Anon and on 300mg  . Palpitation with higher dose, she  SOB with mild activity, no chest pain, taking aspirin but down to 81 mg daily . She was given Eliquis back in 2015 but it was too expensive and no events since. She is not interested in anti-coagulation, discussed increased risk of strokes. .Seen by Dr. Fletcher Anon in 2015 and again in 2017 after admission for CHF, last visit with him was 05/2017 -having appointments every 6 months.   Atherosclerosis of aorta: continue aspirin, pravastatin and bp control   Patient Active Problem List   Diagnosis Date Noted  . Thoracic aortic atherosclerosis ([place]) 12/30/2016  . Uncontrolled type 2 diabetes mellitus with stage 3 chronic kidney disease, with long-term current use of insulin ([place]) 12/23/2016  . Acute on chronic diastolic congestive heart failure ([place]) 05/21/2016  . Venous insufficiency 05/21/2016  . Hypertensive heart disease 05/21/2016  . Anemia in chronic kidney disease 12/22/2015  . Pulmonary hypertension ([place]) 12/18/2015  . 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 Anxiety 04/12/2015  . Chronic kidney disease (CKD), stage III (  moderate) ([place]) 04/12/2015  . Edema extremities 04/12/2015  . 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 Asthma, moderate persistent 04/12/2015  . Super obesity 04/12/2015  . Obstructive apnea 04/12/2015  . Restless leg 04/12/2015  . Allergic rhinitis 04/12/2015  . Vitamin D deficiency 04/12/2015  . Controlled gout 04/12/2015  . Premature atrial contractions 11/29/2013  . Benign essential hypertension   . Hyperlipidemia     Past Surgical History:  Procedure  Laterality Date  . CARDIAC CATHETERIZATION  2002   [facility]  . PERICARDIUM SURGERY      Family History  Problem Relation Age of Onset  . Heart attack Mother   . Hypertension Mother     Social History   Socioeconomic History  . Marital status: Married    Spouse name: Not on file  . Number of children: Not on file  . Years of education: Not on file  . Highest education level: Not on file  Occupational History  . Occupation: retired  Scientific laboratory technician  . Financial resource strain: Not on file  . Food insecurity:    Worry: Not on file    Inability: Not on file  . Transportation needs:    Medical: Not on file    Non-medical: Not on file  Tobacco Use  . Smoking status: Never Smoker  . Smokeless tobacco: Never Used  . Tobacco comment: n/a  Substance and Sexual Activity  . Alcohol use: No    Alcohol/week: 0.0 oz  . Drug use: No  . Sexual activity: Not Currently  Lifestyle  . Physical activity:    Days per week: Not on file    Minutes per session: Not on file  . Stress: Not on file  Relationships  . Social connections:    Talks on phone: Not on file    Gets together: Not on file    Attends religious service: Not on file    Active member of club or organization: Not on file    Attends meetings of clubs or organizations: Not on file    Relationship status: Not on file  . Intimate partner violence:    Fear of current or ex partner: Not on file    Emotionally abused: Not on file    Physically abused: Not on file    Forced sexual activity: Not on file  Other Topics Concern  . Not on file  Social History Narrative  . Not on file     Current Outpatient Medications:  .  albuterol (PROVENTIL HFA;VENTOLIN HFA) 108 (90 BASE) MCG/ACT inhaler, Inhale 2 puffs into the lungs every 6 (six) hours as needed for wheezing or shortness of breath., Disp: , Rfl:  .  allopurinol (ZYLOPRIM) 100 MG tablet, Take 1 tablet (100 mg total) by mouth 2 (two) times daily., Disp: 180 tablet, Rfl: 1 .   aspirin EC 81 MG tablet, Take 81 mg by mouth daily., Disp: , Rfl:  .  Cholecalciferol (VITAMIN D) 2000 UNITS tablet, Take 2,000 Units by mouth daily., Disp: , Rfl:  .  colchicine (COLCRYS) 0.6 MG tablet, Take 1-2 tablets (0.6-1.2 mg total) by mouth daily as needed., Disp: 30 tablet, Rfl: 0 .  diltiazem (CARDIZEM CD) 300 MG 24 hr capsule, Take 300 mg by mouth., Disp: , Rfl:  .  Dulaglutide (TRULICITY) 1.5 XN/2.3FT SOPN, Inject 1.5 mg into the skin once a week., Disp: 6 mL, Rfl: 1 .  Elastic Bandages & Supports (MEDICAL COMPRESSION STOCKINGS) MISC, 2 each by Does not apply route daily.,  Disp: 2 each, Rfl: 2 .  fluconazole (DIFLUCAN) 150 MG tablet, TAKE ONE TABLET BY MOUTH EVERY OTHER DAY AS NEEDED FOR  YEAST  INFECTION, Disp: 4 tablet, Rfl: 0 .  fluticasone (FLONASE) 50 MCG/ACT nasal spray, Place 2 sprays into both nostrils daily., Disp: 48 g, Rfl: 1 .  Fluticasone-Salmeterol (ADVAIR DISKUS) 250-50 MCG/DOSE AEPB, Inhale 1 puff into the lungs 2 (two) times daily., Disp: 3 each, Rfl: 1 .  furosemide (LASIX) 40 MG tablet, Take 1 tablet (40 mg total) by mouth 2 (two) times daily as needed., Disp: 180 tablet, Rfl: 1 .  Insulin Lispro Prot & Lispro (HUMALOG MIX 75/25 KWIKPEN) (75-25) 100 UNIT/ML Kwikpen, Inject 38-60 Units into the skin 2 (two) times daily., Disp: 15 pen, Rfl: 1 .  Insulin Pen Needle (NOVOFINE) 30G X 8 MM MISC, Inject 10 each into the skin daily., Disp: 100 each, Rfl: 1 .  irbesartan (AVAPRO) 300 MG tablet, Take 1 tablet (300 mg total) by mouth daily., Disp: 90 tablet, Rfl: 1 .  pravastatin (PRAVACHOL) 80 MG tablet, Take 1 tablet (80 mg total) by mouth every evening., Disp: 90 tablet, Rfl: 1  No Known Allergies   ROS  Constitutional: Negative for fever or weight change.  Respiratory: Negative for cough and shortness of breath.   Cardiovascular: Negative for chest pain or palpitations.  Gastrointestinal: Negative for abdominal pain, no bowel changes.  Musculoskeletal: Positive for  gait problem or joint swelling.  Skin: Negative for rash.  Neurological: Negative for dizziness or headache.  No other specific complaints in a complete review of systems (except as listed in HPI above).  Objective  Vitals:   11/25/17 0741  BP: (!) 134/50  Pulse: 82  Resp: 16  SpO2: 97%  Weight: (!) 390 lb (176.9 kg)  Height: 5\' 9"  (1.753 m)    Body mass index is 57.59 kg/m.  Physical Exam  Constitutional: Patient appears well-developed and well-nourished. Obese No distress.  HEENT: head atraumatic, normocephalic, pupils equal and reactive to light, , neck supple, throat within normal limits Cardiovascular: Normal rate, regular rhythm and normal heart sounds.  No murmur heard. Trace  BLE edema. Pulmonary/Chest: Effort normal and breath sounds normal. No respiratory distress. Abdominal: Soft.  There is no tenderness. Psychiatric: Patient has a normal mood and affect. behavior is normal. Judgment and thought content normal.  Recent Results (from the past 2160 hour(s))  HM DIABETES EYE EXAM     Status: None   Collection Time: 09/02/17 12:00 AM  Result Value Ref Range   HM Diabetic Eye Exam No Retinopathy No Retinopathy    Comment: [facility], Dr. George Ina      PHQ2/9: Depression screen [facility] 2/9 08/24/2017 03/25/2017 12/30/2016 09/28/2016 08/25/2016  Decreased Interest 0 0 0 0 0  Down, Depressed, Hopeless 0 0 0 0 0  PHQ - 2 Score 0 0 0 0 0     Fall Risk: Fall Risk  11/25/2017 08/24/2017 07/27/2017 03/25/2017 12/30/2016  Falls in the past year? No No No No No     Functional Status Survey: Is the patient deaf or have difficulty hearing?: No Does the patient have difficulty seeing, even when wearing glasses/contacts?: No Does the patient have difficulty concentrating, remembering, or making decisions?: No Does the patient have difficulty walking or climbing stairs?: No Does the patient have difficulty dressing or bathing?: No Does the patient have difficulty doing errands  alone such as visiting a doctor's office or shopping?: No    Assessment & Plan  1. Controlled type 2 diabetes mellitus with stage 3 chronic kidney disease, with long-term current use of insulin (HCC)  - POCT HgB A1C  2. Controlled gout  - allopurinol (ZYLOPRIM) 100 MG tablet; Take 1 tablet (100 mg total) by mouth 2 (two) times daily.  Dispense: 180 tablet; Refill: 1  3. Controlled type 2 diabetes mellitus with microalbuminuria, with long-term current use of insulin (HCC)  - Dulaglutide (TRULICITY) 1.5 ZC/5.8IF SOPN; Inject 1.5 mg into the skin once a week.  Dispense: 6 mL; Refill: 1 - Insulin Lispro Prot & Lispro (HUMALOG MIX 75/25 KWIKPEN) (75-25) 100 UNIT/ML Kwikpen; Inject 38-60 Units into the skin 2 (two) times daily.  Dispense: 15 pen; Refill: 1 - Insulin Pen Needle (NOVOFINE) 30G X 8 MM MISC; Inject 10 each into the skin daily.  Dispense: 100 each; Refill: 1  4. Moderate persistent asthma without complication  - Fluticasone-Salmeterol (ADVAIR DISKUS) 250-50 MCG/DOSE AEPB; Inhale 1 puff into the lungs 2 (two) times daily.  Dispense: 3 each; Refill: 1  5. Hypertensive heart disease with heart failure (HCC)  - furosemide (LASIX) 40 MG tablet; Take 1 tablet (40 mg total) by mouth 2 (two) times daily as needed.  Dispense: 180 tablet; Refill: 1  6. Mixed hyperlipidemia  - pravastatin (PRAVACHOL) 80 MG tablet; Take 1 tablet (80 mg total) by mouth every evening.  Dispense: 90 tablet; Refill: 1 - Lipid panel  7. Benign essential hypertension  - irbesartan (AVAPRO) 300 MG tablet; Take 1 tablet (300 mg total) by mouth daily.  Dispense: 90 tablet; Refill: 1 - Hepatic function panel  8. Leukocytosis, unspecified type  - CBC with Differential/Platelet  9. Morbid obesity, unspecified obesity type [facility])  Discussed with the patient the risk posed by an increased BMI. Discussed importance of portion control, calorie counting and at least 150 minutes of physical activity weekly. Avoid  sweet beverages and drink more water. Eat at least 6 servings of fruit and vegetables daily   10. Thoracic aortic atherosclerosis (HCC)  Continue statin therapy and aspirin

## 2017-11-25 NOTE — Telephone Encounter (Signed)
[facility] is faxing Clarification on Colchinie and Cartia have a drug to drug interaction? Levels of colchicine may be increased causing toxicity, please advise.

## 2017-11-25 NOTE — Telephone Encounter (Signed)
She only takes colchicine prn , okay to fill it

## 2017-11-28 ENCOUNTER — Other Ambulatory Visit: Payer: Self-pay

## 2017-11-28 NOTE — Telephone Encounter (Signed)
Informed [name] at [place] it was ok to filled the Colchicine due to medication only being taken as needed per Dr. Ancil Boozer.

## 2017-11-29 LAB — CBC WITH DIFFERENTIAL/PLATELET
BASOS PCT: 0.8 %
Basophils Absolute: 67 cells/uL (ref 0–200)
EOS ABS: 294 {cells}/uL (ref 15–500)
Eosinophils Relative: 3.5 %
HCT: 33.5 % — ABNORMAL LOW (ref 35.0–45.0)
Hemoglobin: 10.9 g/dL — ABNORMAL LOW (ref 11.7–15.5)
Lymphs Abs: 2150 cells/uL (ref 850–3900)
MCH: 28 pg (ref 27.0–33.0)
MCHC: 32.5 g/dL (ref 32.0–36.0)
MCV: 86.1 fL (ref 80.0–100.0)
MPV: 11.1 fL (ref 7.5–12.5)
Monocytes Relative: 7.7 %
NEUTROS PCT: 62.4 %
Neutro Abs: 5242 cells/uL (ref 1500–7800)
PLATELETS: 249 10*3/uL (ref 140–400)
RBC: 3.89 10*6/uL (ref 3.80–5.10)
RDW: 14.3 % (ref 11.0–15.0)
TOTAL LYMPHOCYTE: 25.6 %
WBC: 8.4 10*3/uL (ref 3.8–10.8)
WBCMIX: 647 {cells}/uL (ref 200–950)

## 2017-11-29 LAB — HEPATIC FUNCTION PANEL
AG RATIO: 1.3 (calc) (ref 1.0–2.5)
ALT: 7 U/L (ref 6–29)
AST: 13 U/L (ref 10–35)
Albumin: 3.6 g/dL (ref 3.6–5.1)
Alkaline phosphatase (APISO): 103 U/L (ref 33–130)
BILIRUBIN DIRECT: 0 mg/dL (ref 0.0–0.2)
BILIRUBIN INDIRECT: 0.3 mg/dL (ref 0.2–1.2)
GLOBULIN: 2.8 g/dL (ref 1.9–3.7)
Total Bilirubin: 0.3 mg/dL (ref 0.2–1.2)
Total Protein: 6.4 g/dL (ref 6.1–8.1)

## 2017-11-29 LAB — LIPID PANEL
Cholesterol: 138 mg/dL (ref <200)
HDL: 42 mg/dL — AB (ref >50)
LDL Cholesterol (Calc): 70 mg/dL (calc)
Non-HDL Cholesterol (Calc): 96 mg/dL (calc) (ref <130)
TRIGLYCERIDES: 188 mg/dL — AB (ref <150)
Total CHOL/HDL Ratio: 3.3 (calc) (ref <5.0)

## 2017-11-29 LAB — TEST AUTHORIZATION

## 2017-11-29 LAB — FERRITIN: Ferritin: 137 ng/mL (ref 20–288)

## 2017-12-09 DIAGNOSIS — E1122 Type 2 diabetes mellitus with diabetic chronic kidney disease: Secondary | ICD-10-CM | POA: Diagnosis not present

## 2017-12-09 DIAGNOSIS — M109 Gout, unspecified: Secondary | ICD-10-CM | POA: Diagnosis not present

## 2017-12-09 DIAGNOSIS — E785 Hyperlipidemia, unspecified: Secondary | ICD-10-CM | POA: Diagnosis not present

## 2017-12-09 DIAGNOSIS — N183 Chronic kidney disease, stage 3 (moderate): Secondary | ICD-10-CM | POA: Diagnosis not present

## 2017-12-09 DIAGNOSIS — M545 Low back pain: Secondary | ICD-10-CM | POA: Diagnosis not present

## 2017-12-09 DIAGNOSIS — J449 Chronic obstructive pulmonary disease, unspecified: Secondary | ICD-10-CM | POA: Diagnosis not present

## 2017-12-09 DIAGNOSIS — I130 Hypertensive heart and chronic kidney disease with heart failure and stage 1 through stage 4 chronic kidney disease, or unspecified chronic kidney disease: Secondary | ICD-10-CM | POA: Diagnosis not present

## 2017-12-09 DIAGNOSIS — I509 Heart failure, unspecified: Secondary | ICD-10-CM | POA: Diagnosis not present

## 2017-12-12 ENCOUNTER — Other Ambulatory Visit: Payer: Self-pay

## 2017-12-12 DIAGNOSIS — E782 Mixed hyperlipidemia: Secondary | ICD-10-CM

## 2017-12-12 DIAGNOSIS — I10 Essential (primary) hypertension: Secondary | ICD-10-CM

## 2017-12-12 MED ORDER — TRUE METRIX LEVEL 1 LOW VI SOLN: 1.0000 | 1 refills | Status: AC

## 2017-12-12 MED ORDER — TRUE METRIX AIR GLUCOSE METER W/DEVICE KIT: 1.0000 | PACK | Freq: Three times a day (TID) | 0 refills | Status: DC

## 2017-12-12 MED ORDER — IRBESARTAN 300 MG PO TABS: 300.0000 mg | ORAL_TABLET | Freq: Every day | ORAL | 1 refills | Status: DC

## 2017-12-12 MED ORDER — TRUEPLUS LANCETS 33G MISC: 1.0000 | Freq: Three times a day (TID) | 2 refills | Status: DC

## 2017-12-12 MED ORDER — PRAVASTATIN SODIUM 80 MG PO TABS: 80.0000 mg | ORAL_TABLET | Freq: Every evening | ORAL | 1 refills | Status: DC

## 2017-12-12 MED ORDER — DILTIAZEM HCL ER COATED BEADS 300 MG PO CP24: 300.0000 mg | ORAL_CAPSULE | Freq: Every day | ORAL | 1 refills | Status: DC

## 2017-12-12 MED ORDER — GLUCOSE BLOOD VI STRP: ORAL_STRIP | 1 refills | Status: DC

## 2017-12-12 NOTE — Telephone Encounter (Signed)
Refill request for general medication. Received request for Cartia, Irbesartan, Pravastatin, [employer] True Metrix Meter, Strips, Lancets to all go to [facility] instead of [facility]. Patient states prescriptions are free thru [facility].   Last office visit: 11/25/2017   Follow up on 12/30/2017

## 2017-12-30 ENCOUNTER — Ambulatory Visit: Payer: Medicare PPO | Admitting: Family Medicine

## 2018-01-04 ENCOUNTER — Telehealth: Payer: Self-pay | Admitting: Family Medicine

## 2018-01-04 DIAGNOSIS — G4733 Obstructive sleep apnea (adult) (pediatric): Secondary | ICD-10-CM

## 2018-01-04 NOTE — Telephone Encounter (Signed)
Copied from [facility] [id number]. Topic: Quick Communication - See Telephone Encounter >> Jan 04, 2018 10:47 AM Bea Graff, NT wrote: CRM for notification. See Telephone encounter for: 01/04/18. Pt is calling and states that [facility] is needing authorization from Dr. Ancil Boozer for pt to have a CPAP mask and tubing. Pt needs the rx faxed to [facility] to get the equipment. Fax#: [phone]. Phone#: [phone]. She needs the brand [facility] for the mask size medium.

## 2018-01-06 NOTE — Telephone Encounter (Signed)
Order was printed and faxed to [facility] at [phone]. Their phone is [phone].

## 2018-01-06 NOTE — Telephone Encounter (Signed)
The requested information has been sent. 

## 2018-01-11 ENCOUNTER — Telehealth: Payer: Self-pay

## 2018-01-11 DIAGNOSIS — G4733 Obstructive sleep apnea (adult) (pediatric): Secondary | ICD-10-CM | POA: Diagnosis not present

## 2018-01-11 DIAGNOSIS — G4730 Sleep apnea, unspecified: Secondary | ICD-10-CM

## 2018-01-11 NOTE — Telephone Encounter (Signed)
Amber Reyes states they need a CPAP order please fax to [phone]

## 2018-01-12 NOTE — Telephone Encounter (Signed)
Faxed prescription last night for CPAP machine and supplies.

## 2018-01-12 NOTE — Telephone Encounter (Signed)
Ordered , please release and fax to [facility]

## 2018-03-23 ENCOUNTER — Other Ambulatory Visit: Payer: Self-pay

## 2018-03-23 MED ORDER — FLUTICASONE PROPIONATE 50 MCG/ACT NA SUSP: 2.0000 | Freq: Every day | NASAL | 2 refills | Status: DC

## 2018-03-23 NOTE — Telephone Encounter (Signed)
Refill request for general medication.Flonase  Last office visit  No follow-ups on file.

## 2018-04-25 ENCOUNTER — Ambulatory Visit (INDEPENDENT_AMBULATORY_CARE_PROVIDER_SITE_OTHER): Payer: Medicare PPO | Admitting: Family Medicine

## 2018-04-25 ENCOUNTER — Encounter: Payer: Self-pay | Admitting: Family Medicine

## 2018-04-25 VITALS — BP 124/78 | HR 73 | Temp 98.3°F | Resp 20 | Ht 69.0 in | Wt 391.9 lb

## 2018-04-25 DIAGNOSIS — M109 Gout, unspecified: Secondary | ICD-10-CM | POA: Diagnosis not present

## 2018-04-25 DIAGNOSIS — E785 Hyperlipidemia, unspecified: Secondary | ICD-10-CM

## 2018-04-25 DIAGNOSIS — N183 Chronic kidney disease, stage 3 (moderate): Secondary | ICD-10-CM

## 2018-04-25 DIAGNOSIS — E1169 Type 2 diabetes mellitus with other specified complication: Secondary | ICD-10-CM

## 2018-04-25 DIAGNOSIS — G4730 Sleep apnea, unspecified: Secondary | ICD-10-CM

## 2018-04-25 DIAGNOSIS — I700 Atherosclerosis of aorta: Secondary | ICD-10-CM

## 2018-04-25 DIAGNOSIS — I110 Hypertensive heart disease with heart failure: Secondary | ICD-10-CM

## 2018-04-25 DIAGNOSIS — J4540 Moderate persistent asthma, uncomplicated: Secondary | ICD-10-CM

## 2018-04-25 DIAGNOSIS — I10 Essential (primary) hypertension: Secondary | ICD-10-CM | POA: Diagnosis not present

## 2018-04-25 DIAGNOSIS — Z23 Encounter for immunization: Secondary | ICD-10-CM | POA: Diagnosis not present

## 2018-04-25 DIAGNOSIS — I272 Pulmonary hypertension, unspecified: Secondary | ICD-10-CM

## 2018-04-25 DIAGNOSIS — E1122 Type 2 diabetes mellitus with diabetic chronic kidney disease: Secondary | ICD-10-CM

## 2018-04-25 DIAGNOSIS — R809 Proteinuria, unspecified: Secondary | ICD-10-CM

## 2018-04-25 DIAGNOSIS — E1129 Type 2 diabetes mellitus with other diabetic kidney complication: Secondary | ICD-10-CM | POA: Diagnosis not present

## 2018-04-25 DIAGNOSIS — Z794 Long term (current) use of insulin: Secondary | ICD-10-CM | POA: Diagnosis not present

## 2018-04-25 LAB — POCT GLYCOSYLATED HEMOGLOBIN (HGB A1C): HbA1c, POC (controlled diabetic range): 6.5 % (ref 0.0–7.0)

## 2018-04-25 MED ORDER — DILTIAZEM HCL ER COATED BEADS 300 MG PO CP24: 300.0000 mg | ORAL_CAPSULE | Freq: Every day | ORAL | 1 refills | Status: DC

## 2018-04-25 MED ORDER — ALLOPURINOL 100 MG PO TABS: 100.0000 mg | ORAL_TABLET | Freq: Two times a day (BID) | ORAL | 1 refills | Status: DC

## 2018-04-25 MED ORDER — FUROSEMIDE 40 MG PO TABS: 40.0000 mg | ORAL_TABLET | Freq: Two times a day (BID) | ORAL | 1 refills | Status: DC | PRN

## 2018-04-25 MED ORDER — INSULIN LISPRO PROT & LISPRO (75-25 MIX) 100 UNIT/ML KWIKPEN: 38.0000 [IU] | PEN_INJECTOR | Freq: Two times a day (BID) | SUBCUTANEOUS | 1 refills | Status: DC

## 2018-04-25 MED ORDER — IRBESARTAN 300 MG PO TABS: 300.0000 mg | ORAL_TABLET | Freq: Every day | ORAL | 1 refills | Status: DC

## 2018-04-25 MED ORDER — DULAGLUTIDE 1.5 MG/0.5ML ~~LOC~~ SOAJ: 1.5000 mg | SUBCUTANEOUS | 1 refills | Status: DC

## 2018-04-25 MED ORDER — PRAVASTATIN SODIUM 80 MG PO TABS: 80.0000 mg | ORAL_TABLET | Freq: Every evening | ORAL | 1 refills | Status: DC

## 2018-04-25 MED ORDER — FLUTICASONE-SALMETEROL 250-50 MCG/DOSE IN AEPB: 1.0000 | INHALATION_SPRAY | Freq: Two times a day (BID) | RESPIRATORY_TRACT | 1 refills | Status: DC

## 2018-04-25 MED ORDER — FLUCONAZOLE 150 MG PO TABS: ORAL_TABLET | ORAL | 0 refills | Status: DC

## 2018-04-25 NOTE — Progress Notes (Signed)
Name: Amber Reyes   MRN: [id number]    DOB: 14-Jun-1946   Date:04/25/2018       Progress Note  Subjective  Chief Complaint  Chief Complaint  Patient presents with  . Medication Refill  . Diabetes    Checking twice daily on average-120's  . Gout  . Hypertension    Edema-just a little per patient, headaches  . Asthma    This summer has been making her symptoms worst  . Sleep Apnea    HPI  DMII controlled: She has been compliant with medication, on Humalog 75/25 since Feb 2018, also started on Trulicity in 3267 and [id number] been at [facility] today is 6.5%.  Taking Avapro for nephropathy- urine micro was normal in Dec, but up to 19 Jan 2017, she is due for recheck today. No polyphagia, polydipsia or polyuria. She states able to afford her medication now. FSBS at home  90's-130's. She denies any new episodes of hypoglycemia, lowest level 93   HTN/CHF: taking medications and denies side effects of medications, bp is at goal, she denies chest painno recentpalpitation. At home bp 120's-130's/58-70's  She has orthopnea, SOB is stable, and edema has been stable. , Echo done 04/2016 LV function is normal with grade 1 diastolic dysfunction and mildly dilated atrium  Hyperlipidemia: taking Pravastatin, we will continue current medication , reviewed labs done 11/2017, LDL at goal.   RLS: symptoms are still present, described as soreness on both legs at night, improves when she rubs her legs. She was having cramps after taking lasix, but is currently taking lasix every other day and has been doing better.   CKIII: she is going to see nephrologist at [facility], last labs reviewed still stage III CKI with mild drop of GFR , no itching , good urine output. Unchanged   OSA: she has been wearing CPAP machineevery night now, better compliance since she got a new mask.Advised to wear it every night since she has mild pulmonary hypertension also , found on echo done in 2015 by Dr Fletcher Anon    Morbid Obesity: her weight is stable. She is eating better now but still not active , she went to a bariatric surgery consultation, but afraid to have surgery, she states her daughter had the surgery and is doing well, but she is personally scared of having surgery . Unchanged   Asthma Moderate persistent: one flare back in [id number]. She is compliant and uses Advair twice daily as prescribed, no side effects. Summer was very hard on her, she states the humidity, pollen and heat caused her to use rescue inhaler more often. Starting to feel better, usually coughing in am's only, not at night. No wheezing or SOB   PAC: on Cardizem CDseen by Dr. Fletcher Anon and on 316m . Palpitation with higher dose,sheSOB with mild activity, no chest pain, taking aspirin but down to 81 mg daily . She was given Eliquis back in 2015 but it was too expensive and no events since. She is not interested in anti-coagulation, discussed increased risk of strokes. .Seen by Dr. AFletcher Anonin 2015 and again in 2017 after admission for CHF. Unchanged   Atherosclerosis of aorta: continue aspirin, pravastatin and bp control . No easty bruise    Patient Active Problem List   Diagnosis Date Noted  . Thoracic aortic atherosclerosis ([place] 12/30/2016  . Uncontrolled type 2 diabetes mellitus with stage 3 chronic kidney disease, with long-term current use of insulin ([place] 12/23/2016  . Acute on chronic diastolic congestive heart  failure ([place]) 05/21/2016  . Venous insufficiency 05/21/2016  . Hypertensive heart disease 05/21/2016  . Anemia in chronic kidney disease 12/22/2015  . Pulmonary hypertension ([place]) 12/18/2015  . 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 Anxiety 04/12/2015  . Chronic kidney disease (CKD), stage III (moderate) ([place]) 04/12/2015  . Edema extremities 04/12/2015  . 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 Asthma, moderate persistent 04/12/2015  . Morbid obesity, unspecified obesity type ([place]) 04/12/2015  . Obstructive apnea  04/12/2015  . Restless leg 04/12/2015  . Allergic rhinitis 04/12/2015  . Vitamin D deficiency 04/12/2015  . Controlled gout 04/12/2015  . Premature atrial contractions 11/29/2013  . Benign essential hypertension   . Hyperlipidemia     Past Surgical History:  Procedure Laterality Date  . CARDIAC CATHETERIZATION  2002   [facility]  . PERICARDIUM SURGERY      Family History  Problem Relation Age of Onset  . Heart attack Mother   . Hypertension Mother     Social History   Socioeconomic History  . Marital status: Married    Spouse name: Not on file  . Number of children: 3  . Years of education: Not on file  . Highest education level: Associate degree: academic program  Occupational History  . Occupation: retired  Scientific laboratory technician  . Financial resource strain: Not hard at all  . Food insecurity:    Worry: Never true    Inability: Never true  . Transportation needs:    Medical: No    Non-medical: No  Tobacco Use  . Smoking status: Never Smoker  . Smokeless tobacco: Never Used  . Tobacco comment: n/a  Substance and Sexual Activity  . Alcohol use: No    Alcohol/week: 0.0 standard drinks  . Drug use: No  . Sexual activity: Not Currently  Lifestyle  . Physical activity:    Days per week: 0 days    Minutes per session: Not on file  . Stress: Only a little  Relationships  . Social connections:    Talks on phone: More than three times a week    Gets together: Once a week    Attends religious service: More than 4 times per year    Active member of club or organization: Yes    Attends meetings of clubs or organizations: More than 4 times per year    Relationship status: Married  . Intimate partner violence:    Fear of current or ex partner: No    Emotionally abused: No    Physically abused: No    Forced sexual activity: No  Other Topics Concern  . Not on file  Social History Narrative  . Not on file     Current Outpatient Medications:  .  albuterol (PROVENTIL  HFA;VENTOLIN HFA) 108 (90 BASE) MCG/ACT inhaler, Inhale 2 puffs into the lungs every 6 (six) hours as needed for wheezing or shortness of breath., Disp: , Rfl:  .  allopurinol (ZYLOPRIM) 100 MG tablet, Take 1 tablet (100 mg total) by mouth 2 (two) times daily., Disp: 180 tablet, Rfl: 1 .  aspirin EC 81 MG tablet, Take 81 mg by mouth daily., Disp: , Rfl:  .  Blood Glucose Calibration (TRUE METRIX LEVEL 1) Low SOLN, 1 each by In Vitro route once a week., Disp: 1 each, Rfl: 1 .  Blood Glucose Monitoring Suppl (TRUE METRIX AIR GLUCOSE METER) w/Device KIT, 1 kit by Does not apply route 3 (three) times daily., Disp: 1 kit, Rfl: 0 .  Cholecalciferol (VITAMIN D) 2000  UNITS tablet, Take 2,000 Units by mouth daily., Disp: , Rfl:  .  colchicine (COLCRYS) 0.6 MG tablet, Take 1-2 tablets (0.6-1.2 mg total) by mouth daily as needed., Disp: 30 tablet, Rfl: 0 .  diltiazem (CARDIZEM CD) 300 MG 24 hr capsule, Take 1 capsule (300 mg total) by mouth daily., Disp: 90 capsule, Rfl: 1 .  Dulaglutide (TRULICITY) 1.5 CW/8.8QB SOPN, Inject 1.5 mg into the skin once a week., Disp: 6 mL, Rfl: 1 .  Elastic Bandages & Supports (MEDICAL COMPRESSION STOCKINGS) MISC, 2 each by Does not apply route daily., Disp: 2 each, Rfl: 2 .  fluconazole (DIFLUCAN) 150 MG tablet, TAKE ONE TABLET BY MOUTH EVERY OTHER DAY AS NEEDED FOR  YEAST  INFECTION, Disp: 4 tablet, Rfl: 0 .  fluticasone (FLONASE) 50 MCG/ACT nasal spray, Place 2 sprays into both nostrils daily., Disp: 48 g, Rfl: 2 .  Fluticasone-Salmeterol (ADVAIR DISKUS) 250-50 MCG/DOSE AEPB, Inhale 1 puff into the lungs 2 (two) times daily., Disp: 3 each, Rfl: 1 .  furosemide (LASIX) 40 MG tablet, Take 1 tablet (40 mg total) by mouth 2 (two) times daily as needed., Disp: 180 tablet, Rfl: 1 .  glucose blood (TRUE METRIX BLOOD GLUCOSE TEST) test strip, Use as instructed, Disp: 300 each, Rfl: 1 .  Insulin Lispro Prot & Lispro (HUMALOG MIX 75/25 KWIKPEN) (75-25) 100 UNIT/ML Kwikpen, Inject 38-60  Units into the skin 2 (two) times daily., Disp: 15 pen, Rfl: 1 .  Insulin Pen Needle (NOVOFINE) 30G X 8 MM MISC, Inject 10 each into the skin daily., Disp: 100 each, Rfl: 1 .  irbesartan (AVAPRO) 300 MG tablet, Take 1 tablet (300 mg total) by mouth daily., Disp: 90 tablet, Rfl: 1 .  pravastatin (PRAVACHOL) 80 MG tablet, Take 1 tablet (80 mg total) by mouth every evening., Disp: 90 tablet, Rfl: 1 .  TRUEPLUS LANCETS 33G MISC, 1 each by Does not apply route 3 (three) times daily., Disp: 300 each, Rfl: 2  No Known Allergies   ROS  Constitutional: Negative for fever or weight change.  Respiratory: Negative for cough and shortness of breath.   Cardiovascular: Negative for chest pain or palpitations.  Gastrointestinal: Negative for abdominal pain, no bowel changes.  Musculoskeletal: Negative for gait problem or joint swelling.  Skin: Negative for rash.  Neurological: Negative for dizziness or headache.  No other specific complaints in a complete review of systems (except as listed in HPI above).  Objective  Vitals:   04/25/18 0908  BP: 124/78  Pulse: 73  Resp: 20  Temp: 98.3 F (36.8 C)  TempSrc: Oral  SpO2: 99%  Weight: (!) 391 lb 14.4 oz (177.8 kg)  Height: '5\' 9"'  (1.753 m)    Body mass index is 57.87 kg/m.  Physical Exam  Constitutional: Patient appears well-developed and well-nourished. Obese  No distress.  HEENT: head atraumatic, normocephalic, pupils equal and reactive to light,  neck supple, throat within normal limits Cardiovascular: Normal rate, regular rhythm and normal heart sounds.  Opening click heard.  No BLE edema. Pulmonary/Chest: Effort normal and breath sounds normal. No respiratory distress. Abdominal: Soft.  There is no tenderness. Psychiatric: Patient has a normal mood and affect. behavior is normal. Judgment and thought content normal.  Recent Results (from the past 2160 hour(s))  POCT HgB A1C     Status: Normal   Collection Time: 04/25/18  9:14 AM   Result Value Ref Range   Hemoglobin A1C     HbA1c POC (<> result, manual entry)  HbA1c, POC (prediabetic range)     HbA1c, POC (controlled diabetic range) 6.5 0.0 - 7.0 %    Diabetic Foot Exam: Diabetic Foot Exam - Simple   Simple Foot Form Diabetic Foot exam was performed with the following findings:  Yes 04/25/2018  9:54 AM  Visual Inspection See comments:  Yes Sensation Testing Intact to touch and monofilament testing bilaterally:  Yes Pulse Check Posterior Tibialis and Dorsalis pulse intact bilaterally:  Yes Comments Corn on right 5 th toe, hammer toe on right side      PHQ2/9: Depression screen [facility] 2/9 04/25/2018 08/24/2017 03/25/2017 12/30/2016 09/28/2016  Decreased Interest 0 0 0 0 0  Down, Depressed, Hopeless 0 0 0 0 0  PHQ - 2 Score 0 0 0 0 0     Fall Risk: Fall Risk  11/25/2017 08/24/2017 07/27/2017 03/25/2017 12/30/2016  Falls in the past year? No No No No No     Functional Status Survey: Is the patient deaf or have difficulty hearing?: No Does the patient have difficulty seeing, even when wearing glasses/contacts?: Yes(glasses) Does the patient have difficulty concentrating, remembering, or making decisions?: No Does the patient have difficulty walking or climbing stairs?: No Does the patient have difficulty dressing or bathing?: No Does the patient have difficulty doing errands alone such as visiting a doctor's office or shopping?: No    Assessment & Plan  1. Controlled type 2 diabetes mellitus with stage 3 chronic kidney disease, with long-term current use of insulin (HCC)  - POCT HgB A1C - Urine Microalbumin w/creat. ratio  2. Need for immunization against influenza  - Flu vaccine HIGH DOSE PF (Fluzone High dose)  3. Controlled gout  - allopurinol (ZYLOPRIM) 100 MG tablet; Take 1 tablet (100 mg total) by mouth 2 (two) times daily.  Dispense: 180 tablet; Refill: 1  4. Controlled type 2 diabetes mellitus with microalbuminuria, with long-term current use of  insulin (HCC)  - Dulaglutide (TRULICITY) 1.5 GG/8.3MO SOPN; Inject 1.5 mg into the skin once a week.  Dispense: 6 mL; Refill: 1 - Insulin Lispro Prot & Lispro (HUMALOG MIX 75/25 KWIKPEN) (75-25) 100 UNIT/ML Kwikpen; Inject 38-60 Units into the skin 2 (two) times daily.  Dispense: 15 pen; Refill: 1  5. Moderate persistent asthma without complication  - Fluticasone-Salmeterol (ADVAIR DISKUS) 250-50 MCG/DOSE AEPB; Inhale 1 puff into the lungs 2 (two) times daily.  Dispense: 3 each; Refill: 1  6. Hypertensive heart disease with heart failure (HCC)  - furosemide (LASIX) 40 MG tablet; Take 1 tablet (40 mg total) by mouth 2 (two) times daily as needed.  Dispense: 180 tablet; Refill: 1  7. Benign essential hypertension  - irbesartan (AVAPRO) 300 MG tablet; Take 1 tablet (300 mg total) by mouth daily.  Dispense: 90 tablet; Refill: 1  8. Dyslipidemia associated with type 2 diabetes mellitus (HCC)  - pravastatin (PRAVACHOL) 80 MG tablet; Take 1 tablet (80 mg total) by mouth every evening.  Dispense: 90 tablet; Refill: 1  9. Sleep apnea, unspecified type  On CPAP   10. Pulmonary hypertension (HCC)  On CPAP   11. Thoracic aortic atherosclerosis (HCC)  Continue medication

## 2018-04-26 LAB — MICROALBUMIN / CREATININE URINE RATIO
Creatinine, Urine: 126 mg/dL (ref 20–275)
Microalb Creat Ratio: 45 mcg/mg creat — ABNORMAL HIGH (ref <30)
Microalb, Ur: 5.7 mg/dL

## 2018-05-08 DIAGNOSIS — I129 Hypertensive chronic kidney disease with stage 1 through stage 4 chronic kidney disease, or unspecified chronic kidney disease: Secondary | ICD-10-CM | POA: Diagnosis not present

## 2018-05-08 DIAGNOSIS — I10 Essential (primary) hypertension: Secondary | ICD-10-CM | POA: Diagnosis not present

## 2018-05-08 DIAGNOSIS — D631 Anemia in chronic kidney disease: Secondary | ICD-10-CM | POA: Diagnosis not present

## 2018-05-08 DIAGNOSIS — N183 Chronic kidney disease, stage 3 (moderate): Secondary | ICD-10-CM | POA: Diagnosis not present

## 2018-05-08 DIAGNOSIS — M109 Gout, unspecified: Secondary | ICD-10-CM | POA: Diagnosis not present

## 2018-05-17 ENCOUNTER — Other Ambulatory Visit: Payer: Self-pay | Admitting: Family Medicine

## 2018-07-14 ENCOUNTER — Ambulatory Visit: Payer: Medicare PPO | Admitting: Cardiovascular Disease

## 2018-07-14 ENCOUNTER — Encounter: Payer: Self-pay | Admitting: Cardiovascular Disease

## 2018-07-14 VITALS — BP 140/60 | HR 84 | Ht 70.0 in | Wt 395.5 lb

## 2018-07-14 DIAGNOSIS — I5032 Chronic diastolic (congestive) heart failure: Secondary | ICD-10-CM | POA: Diagnosis not present

## 2018-07-14 DIAGNOSIS — G4733 Obstructive sleep apnea (adult) (pediatric): Secondary | ICD-10-CM

## 2018-07-14 DIAGNOSIS — I4891 Unspecified atrial fibrillation: Secondary | ICD-10-CM | POA: Diagnosis not present

## 2018-07-14 DIAGNOSIS — I10 Essential (primary) hypertension: Secondary | ICD-10-CM

## 2018-07-14 MED ORDER — APIXABAN 5 MG PO TABS: 5.0000 mg | ORAL_TABLET | Freq: Two times a day (BID) | ORAL | 3 refills | Status: DC

## 2018-07-14 NOTE — Progress Notes (Signed)
Cardiology Office Note   Date:  07/14/2018   ID:  Amber Reyes, DOB [birth year], MRN [id number]  PCP:  Steele Sizer, MD  Cardiologist:   Kathlyn Sacramento, MD   Chief Complaint  Patient presents with  . other    6 month follow up. Meds reviewed by the pt. verbally. Pt. c/o shortness of breath.       History of Present Illness: Amber Reyes is a 72 y.o. female who presents for a followup visit regarding chronic diastolic heart failure and PACs. 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  Echocardiogram in 04/2016 showed normal LV systolic function with an ejection fraction of 99%, grade 1 diastolic dysfunction and mildly dilated left atrium. During last visit, I increase diltiazem to 300 mg daily due to worsening palpitations.  She reports improvement in symptoms.  No chest pain.  Exertional dyspnea is stable.  She has known history of sleep apnea but she reports that her CPAP machine has not been working properly. She is noted to be in atrial fibrillation with controlled ventricular rate today.  She has no prior history of atrial fibrillation.   Past Medical History:  Diagnosis Date  . Allergic rhinitis, cause unspecified   . Anxiety   . Chronic diastolic CHF (congestive heart failure) ([place])   . CKD (chronic kidney disease), stage III ([place])   . Edema   . Essential hypertension, benign   . Gout, unspecified   . Heart murmur    as child  . Hyperlipidemia   . Lipoma of unspecified site   . Other and unspecified disc disorder of lumbar region   . Other general symptoms(780.99)   . PAC (premature atrial contraction)   . Renal insufficiency   . Restless legs syndrome (RLS)   . Sebaceous cyst   . Super obesity   . Type II or unspecified type diabetes mellitus without mention of complication, uncontrolled     . Unspecified asthma(493.90)   . Unspecified disease of pericardium   . Unspecified sleep apnea   . Unspecified vitamin D deficiency   . Vaginitis and vulvovaginitis, unspecified   . Venous insufficiency     Past Surgical History:  Procedure Laterality Date  . CARDIAC CATHETERIZATION  2002   [facility]  . PERICARDIUM SURGERY       Current Outpatient Medications  Medication Sig Dispense Refill  . albuterol (PROVENTIL HFA;VENTOLIN HFA) 108 (90 BASE) MCG/ACT inhaler Inhale 2 puffs into the lungs every 6 (six) hours as needed for wheezing or shortness of breath.    . allopurinol (ZYLOPRIM) 100 MG tablet Take 1 tablet (100 mg total) by mouth 2 (two) times daily. 180 tablet 1  . aspirin EC 81 MG tablet Take 81 mg by mouth daily.    . Blood Glucose Calibration (TRUE METRIX LEVEL 1) Low SOLN 1 each by In Vitro route once a week. 1 each 1  . Blood Glucose Monitoring Suppl (TRUE METRIX AIR GLUCOSE METER) w/Device KIT 1 kit by Does not apply route 3 (three) times daily. 1 kit 0  . Cholecalciferol (VITAMIN D) 2000 UNITS tablet Take 2,000 Units by mouth daily.    . colchicine (COLCRYS) 0.6 MG tablet Take 1-2 tablets (0.6-1.2 mg total) by mouth daily as needed. 30 tablet 0  . diltiazem (CARDIZEM CD) 300 MG 24 hr capsule Take 1 capsule (300 mg total) by mouth  daily. 90 capsule 1  . Dulaglutide (TRULICITY) 1.5 JZ/7.9XT SOPN Inject 1.5 mg into the skin once a week. 6 mL 1  . Elastic Bandages & Supports (MEDICAL COMPRESSION STOCKINGS) MISC 2 each by Does not apply route daily. 2 each 2  . fluconazole (DIFLUCAN) 150 MG tablet TAKE ONE TABLET BY MOUTH EVERY OTHER DAY AS NEEDED FOR  YEAST  INFECTION 4 tablet 0  . fluticasone (FLONASE) 50 MCG/ACT nasal spray Place 2 sprays into both nostrils daily. 48 g 2  . Fluticasone-Salmeterol (ADVAIR DISKUS) 250-50 MCG/DOSE AEPB Inhale 1 puff into the lungs 2 (two) times daily. 3 each 1  . furosemide (LASIX) 40 MG tablet Take 1 tablet (40 mg total) by mouth 2 (two) times  daily as needed. 180 tablet 1  . Insulin Lispro Prot & Lispro (HUMALOG MIX 75/25 KWIKPEN) (75-25) 100 UNIT/ML Kwikpen Inject 38-60 Units into the skin 2 (two) times daily. 15 pen 1  . Insulin Pen Needle (NOVOFINE) 30G X 8 MM MISC Inject 10 each into the skin daily. 100 each 1  . irbesartan (AVAPRO) 300 MG tablet Take 1 tablet (300 mg total) by mouth daily. 90 tablet 1  . pravastatin (PRAVACHOL) 80 MG tablet Take 1 tablet (80 mg total) by mouth every evening. 90 tablet 1  . TRUE METRIX BLOOD GLUCOSE TEST test strip USE  AS  INSTRUCTED 300 each 1  . TRUEPLUS LANCETS 33G MISC 1 each by Does not apply route 3 (three) times daily. 300 each 2   No current facility-administered medications for this visit.     Allergies:   Patient has no known allergies.    Social History:  The patient  reports that she has never smoked. She has never used smokeless tobacco. She reports that she does not drink alcohol or use drugs.   Family History:  The patient's family history includes Heart attack in her mother; Hypertension in her mother.    ROS:  Please see the history of present illness.   Otherwise, review of systems are positive for none.   All other systems are reviewed and negative.    PHYSICAL EXAM: VS:  BP 140/60 (BP Location: Left Arm, Patient Position: Sitting, Cuff Size: Large)   Pulse 84   Ht '5\' 10"'  (1.778 m)   Wt (!) 395 lb 8 oz (179.4 kg)   BMI 56.75 kg/m  , BMI Body mass index is 56.75 kg/m. GEN: Well nourished, well developed, in no acute distress  HEENT: normal  Neck: no JVD, carotid bruits, or masses Cardiac: Irregularly irregular; no rubs, or gallops,no edema . 1/6 systolic murmur at the base Respiratory:  clear to auscultation bilaterally, normal work of breathing GI: soft, nontender, nondistended, + BS MS: no deformity or atrophy  Skin: warm and dry, no rash Neuro:  Strength and sensation are intact Psych: euthymic mood, full affect   EKG:  EKG is ordered today. The ekg  ordered today demonstrates atrial fibrillation with nonspecific ST and T wave changes.  Ventricular rate is 84 bpm.   Recent Labs: 11/25/2017: ALT 7; Hemoglobin 10.9; Platelets 249    Lipid Panel    Component Value Date/Time   CHOL 138 11/25/2017 0838   CHOL 139 04/16/2015 1138   TRIG 188 (H) 11/25/2017 0838   HDL 42 (L) 11/25/2017 0838   HDL 52 04/16/2015 1138   CHOLHDL 3.3 11/25/2017 0838   VLDL 19 12/23/2016 0941   LDLCALC 70 11/25/2017 0838      Wt Readings from Last  3 Encounters:  07/14/18 (!) 395 lb 8 oz (179.4 kg)  04/25/18 (!) 391 lb 14.4 oz (177.8 kg)  11/25/17 (!) 390 lb (176.9 kg)       No flowsheet data found.    ASSESSMENT AND PLAN:  1.  Newly diagnosed atrial fibrillation: Unknown onset the patient seems to be overall asymptomatic.  Chads Vasc score is 5 and thus I recommend long-term anticoagulation.  I discussed this in details with her and recommend starting Eliquis 5 mg twice daily.  Discontinue aspirin. Check CBC and basic metabolic profile in 1 to 2 weeks. Due to morbid obesity and suboptimally treated sleep apnea, I do not think we will have good chance of restoring sinus rhythm with cardioversion.  She is also minimally symptomatic. I requested an echocardiogram.  2. Chronic diastolic heart failure: She appears to be euvolemic on current dose of furosemide .     3. Essential hypertension: Blood pressure is controlled on current medications.  4. Morbid obesity:  I again discussed with her the importance of healthy diet and exercise.  5. Obstructive sleep apnea: She has not been using CPAP because she reports there was some problems with the pressure and there is nobody to check this for her.  I am referring her to pulmonary clinic for management of this.   Disposition:   FU with me in 3 months  Signed,  Kathlyn Sacramento, MD  07/14/2018 3:34 PM    [place]

## 2018-07-14 NOTE — Patient Instructions (Signed)
Medication Instructions:  STOP the Aspirin START Eliquis 5 mg twice daily  If you need a refill on your cardiac medications before your next appointment, please call your pharmacy.   Lab work: Your provider would like for you to have the following labs when you come back for your echo: BMET and CBC  If you have labs (blood work) drawn today and your tests are completely normal, you will receive your results only by: Marland Kitchen MyChart Message (if you have MyChart) OR . A paper copy in the mail If you have any lab test that is abnormal or we need to change your treatment, we will call you to review the results.  Testing/Procedures: Your physician has requested that you have an echocardiogram. Echocardiography is a painless test that uses sound waves to create images of your heart. It provides your doctor with information about the size and shape of your heart and how well your heart's chambers and valves are working. You may receive an ultrasound enhancing agent through an IV if needed to better visualize your heart during the echo.This procedure takes approximately one hour. There are no restrictions for this procedure. This will take place at the [facility] clinic.    Follow-Up: At [facility], you and your health needs are our priority.  As part of our continuing mission to provide you with exceptional heart care, we have created designated Provider Care Teams.  These Care Teams include your primary Cardiologist (physician) and Advanced Practice Providers (APPs -  Physician Assistants and Nurse Practitioners) who all work together to provide you with the care you need, when you need it. You will need a follow up appointment in 3 months. You may see Dr. Fletcher Anon or one of the following Advanced Practice Providers on your designated Care Team:   Murray Hodgkins, NP Christell Faith, PA-C . Marrianne Mood, PA-C  Any Other Special Instructions Will Be Listed Below (If Applicable).  A referral  has been placed for Pulmonary

## 2018-07-19 ENCOUNTER — Ambulatory Visit (INDEPENDENT_AMBULATORY_CARE_PROVIDER_SITE_OTHER): Payer: Medicare PPO | Admitting: Internal Medicine

## 2018-07-19 ENCOUNTER — Encounter: Payer: Self-pay | Admitting: Internal Medicine

## 2018-07-19 VITALS — BP 114/70 | HR 115 | Resp 16 | Ht 70.0 in | Wt 391.0 lb

## 2018-07-19 DIAGNOSIS — G4733 Obstructive sleep apnea (adult) (pediatric): Secondary | ICD-10-CM

## 2018-07-19 DIAGNOSIS — J42 Unspecified chronic bronchitis: Secondary | ICD-10-CM | POA: Diagnosis not present

## 2018-07-19 NOTE — Patient Instructions (Addendum)
We will see if we can adjust your machine for you. Please call us back in one week to check on status, otherwise we will call you if we can fix it.

## 2018-07-19 NOTE — Progress Notes (Addendum)
[facility] Consultation      Assessment and Plan:  Obstructive sleep apnea.  -Obstructive sleep apnea, currently on CPAP which appears to be malfunctioning. - We will see if we can adjust her settings to get the CPAP working properly, if not we may have to order a new device.  Atrial fibrillation.  - Sleep apnea can contribute to atrial for ablation, therefore treatment is OSA is important part of management.  Chronic bronchitis. - Patient has symptoms of chronic bronchitis with excess mucus production early in the morning which then resolves. - Reassured her that this is a normal process, if it becomes problematic she can let us know and we can consider starting treatment.  Addendum 07/25/18; --Reviewed pt's home CPAP machine. 30 day avg usage per night is 4.9/hr, Usage>4hr is 70% of nights. AHI over past 30 days is 2.1. 90% pressure is 10.3. Machine appeared to be in "airplane mode". Taken out of airplane mode, reviewed all settings, set humidity. Set on Auto-CPAP with pressure range 10-15 cm H2O.  Spent 32 min in review and adjustment of data.   Date: 07/19/2018  MRN# [id number] Amber Reyes 27-Jun-1946   Amber Reyes is a 72 y.o. old female seen in consultation for chief complaint of:    Chief Complaint  Patient presents with  . Consult    Referred by Dr. Fletcher Anon for eval of OSA:  . Sleep Apnea    pt states her setting have automatically changed. She was informed by Dr. Fletcher Anon we may be able to help reset.    HPI:   Patient is a 72 year old female with history of obstructive sleep apnea.  She typically goes to bed between 10:11 PM.,  She gets out of bed at 7:30 AM. She got the machine about 5 years ago, and she had been doing well with it. For the last few weeks, there was a power surge, but since then the pressure no longer blows as strong.  Her PCP sent an order for a new machine, but her old machine was working at the time, so she opted not to  get it.   She has an am cough which is productive, and then clears.  No pets at home.   Addendum 07/28/18; review and summary of outside records.  **PSG split night 06/11/16>> PLMI of 17.8, AHI 24. CPAP of 10 controlled lateral position apneas. AHI at pressure of 9 was 17, came down to 0.5 at pressure of 10,  recommend CPAP titration study.  --Total time spent in reviewing records, machine settings, adjusting pressure, 32 minutes.     PMHX:   Past Medical History:  Diagnosis Date  . Allergic rhinitis, cause unspecified   . Anxiety   . Chronic diastolic CHF (congestive heart failure) ([place])   . CKD (chronic kidney disease), stage III ([place])   . Edema   . Essential hypertension, benign   . Gout, unspecified   . Heart murmur    as child  . Hyperlipidemia   . Lipoma of unspecified site   . Other and unspecified disc disorder of lumbar region   . Other general symptoms(780.99)   . PAC (premature atrial contraction)   . Renal insufficiency   . Restless legs syndrome (RLS)   . Sebaceous cyst   . Super obesity   . Type II or unspecified type diabetes mellitus without mention of complication, uncontrolled   . Unspecified asthma(493.90)   . Unspecified disease of pericardium   . Unspecified  sleep apnea   . Unspecified vitamin D deficiency   . Vaginitis and vulvovaginitis, unspecified   . Venous insufficiency    Surgical Hx:  Past Surgical History:  Procedure Laterality Date  . CARDIAC CATHETERIZATION  2002   [facility]  . PERICARDIUM SURGERY     Family Hx:  Family History  Problem Relation Age of Onset  . Heart attack Mother   . Hypertension Mother    Social Hx:   Social History   Tobacco Use  . Smoking status: Never Smoker  . Smokeless tobacco: Never Used  . Tobacco comment: n/a  Substance Use Topics  . Alcohol use: No    Alcohol/week: 0.0 standard drinks  . Drug use: No   Medication:    Current Outpatient Medications:  .  albuterol (PROVENTIL HFA;VENTOLIN HFA) 108  (90 BASE) MCG/ACT inhaler, Inhale 2 puffs into the lungs every 6 (six) hours as needed for wheezing or shortness of breath., Disp: , Rfl:  .  allopurinol (ZYLOPRIM) 100 MG tablet, Take 1 tablet (100 mg total) by mouth 2 (two) times daily., Disp: 180 tablet, Rfl: 1 .  apixaban (ELIQUIS) 5 MG TABS tablet, Take 1 tablet (5 mg total) by mouth 2 (two) times daily., Disp: 180 tablet, Rfl: 3 .  Blood Glucose Calibration (TRUE METRIX LEVEL 1) Low SOLN, 1 each by In Vitro route once a week., Disp: 1 each, Rfl: 1 .  Blood Glucose Monitoring Suppl (TRUE METRIX AIR GLUCOSE METER) w/Device KIT, 1 kit by Does not apply route 3 (three) times daily., Disp: 1 kit, Rfl: 0 .  Cholecalciferol (VITAMIN D) 2000 UNITS tablet, Take 2,000 Units by mouth daily., Disp: , Rfl:  .  colchicine (COLCRYS) 0.6 MG tablet, Take 1-2 tablets (0.6-1.2 mg total) by mouth daily as needed., Disp: 30 tablet, Rfl: 0 .  diltiazem (CARDIZEM CD) 300 MG 24 hr capsule, Take 1 capsule (300 mg total) by mouth daily., Disp: 90 capsule, Rfl: 1 .  Dulaglutide (TRULICITY) 1.5 WR/6.0AV SOPN, Inject 1.5 mg into the skin once a week., Disp: 6 mL, Rfl: 1 .  Elastic Bandages & Supports (MEDICAL COMPRESSION STOCKINGS) MISC, 2 each by Does not apply route daily., Disp: 2 each, Rfl: 2 .  fluconazole (DIFLUCAN) 150 MG tablet, TAKE ONE TABLET BY MOUTH EVERY OTHER DAY AS NEEDED FOR  YEAST  INFECTION, Disp: 4 tablet, Rfl: 0 .  fluticasone (FLONASE) 50 MCG/ACT nasal spray, Place 2 sprays into both nostrils daily., Disp: 48 g, Rfl: 2 .  Fluticasone-Salmeterol (ADVAIR DISKUS) 250-50 MCG/DOSE AEPB, Inhale 1 puff into the lungs 2 (two) times daily., Disp: 3 each, Rfl: 1 .  furosemide (LASIX) 40 MG tablet, Take 1 tablet (40 mg total) by mouth 2 (two) times daily as needed., Disp: 180 tablet, Rfl: 1 .  Insulin Lispro Prot & Lispro (HUMALOG MIX 75/25 KWIKPEN) (75-25) 100 UNIT/ML Kwikpen, Inject 38-60 Units into the skin 2 (two) times daily., Disp: 15 pen, Rfl: 1 .  Insulin  Pen Needle (NOVOFINE) 30G X 8 MM MISC, Inject 10 each into the skin daily., Disp: 100 each, Rfl: 1 .  irbesartan (AVAPRO) 300 MG tablet, Take 1 tablet (300 mg total) by mouth daily., Disp: 90 tablet, Rfl: 1 .  pravastatin (PRAVACHOL) 80 MG tablet, Take 1 tablet (80 mg total) by mouth every evening., Disp: 90 tablet, Rfl: 1 .  TRUE METRIX BLOOD GLUCOSE TEST test strip, USE  AS  INSTRUCTED, Disp: 300 each, Rfl: 1 .  TRUEPLUS LANCETS 33G MISC, 1 each by  Does not apply route 3 (three) times daily., Disp: 300 each, Rfl: 2   Allergies:  Patient has no known allergies.  Review of Systems: Gen:  Denies  fever, sweats, chills HEENT: Denies blurred vision, double vision. bleeds, sore throat Cvc:  No dizziness, chest pain. Resp:   Denies cough or sputum production, shortness of breath Gi: Denies swallowing difficulty, stomach pain. Gu:  Denies bladder incontinence, burning urine Ext:   No Joint pain, stiffness. Skin: No skin rash,  hives  Endoc:  No polyuria, polydipsia. Psych: No depression, insomnia. Other:  All other systems were reviewed with the patient and were negative other that what is mentioned in the HPI.   Physical Examination:   VS: BP 114/70 (BP Location: Left Arm, Cuff Size: Large)   Pulse (!) 115   Resp 16   Ht '5\' 10"'  (1.778 m)   Wt (!) 391 lb (177.4 kg)   SpO2 100%   BMI 56.10 kg/m   General Appearance: No distress  Neuro:without focal findings,  speech normal,  HEENT: PERRLA, EOM intact.   Pulmonary: normal breath sounds, No wheezing.  CardiovascularNormal S1,S2.  No m/r/g.   Abdomen: Benign, Soft, non-tender. Renal:  No costovertebral tenderness  GU:  No performed at this time. Endoc: No evident thyromegaly, no signs of acromegaly. Skin:   warm, no rashes, no ecchymosis  Extremities: normal, no cyanosis, clubbing.  Other findings:    LABORATORY PANEL:   CBC No results for input(s): WBC, HGB, HCT, PLT in the last 168  hours. ------------------------------------------------------------------------------------------------------------------  Chemistries  No results for input(s): NA, K, CL, CO2, GLUCOSE, BUN, CREATININE, CALCIUM, MG, AST, ALT, ALKPHOS, BILITOT in the last 168 hours.  Invalid input(s): GFRCGP ------------------------------------------------------------------------------------------------------------------  Cardiac Enzymes No results for input(s): TROPONINI in the last 168 hours. ------------------------------------------------------------  RADIOLOGY:  No results found.     Thank  you for the consultation and for allowing [facility] to assist in the care of your patient. Our recommendations are noted above.  Please contact us if we can be of further service.   Marda Stalker, M.D., F.C.C.P.  Board Certified in Internal Medicine, Pulmonary Medicine, [place], and Sleep Medicine.  [facility] Office Number: [phone]   07/19/2018

## 2018-07-25 ENCOUNTER — Telehealth: Payer: Self-pay | Admitting: *Deleted

## 2018-07-25 NOTE — Telephone Encounter (Signed)
Pt.notified

## 2018-07-25 NOTE — Telephone Encounter (Signed)
-----   Message from Laverle Hobby, MD sent at 07/25/2018  1:32 PM EST ----- Regarding: pt can pick up her cpap.  I have reviewed and re-adjusted her cpap with pressure range 10-15 cm H2O. She can come back and pick it up to see if it is working.

## 2018-08-04 ENCOUNTER — Other Ambulatory Visit: Payer: Medicare PPO

## 2018-08-04 ENCOUNTER — Other Ambulatory Visit: Payer: Self-pay

## 2018-08-04 ENCOUNTER — Ambulatory Visit (INDEPENDENT_AMBULATORY_CARE_PROVIDER_SITE_OTHER): Payer: Medicare PPO

## 2018-08-04 DIAGNOSIS — I4891 Unspecified atrial fibrillation: Secondary | ICD-10-CM

## 2018-08-04 DIAGNOSIS — I10 Essential (primary) hypertension: Secondary | ICD-10-CM | POA: Diagnosis not present

## 2018-08-04 DIAGNOSIS — G4733 Obstructive sleep apnea (adult) (pediatric): Secondary | ICD-10-CM | POA: Diagnosis not present

## 2018-08-04 NOTE — Telephone Encounter (Signed)
Medication Samples have been provided to the patient.  Drug name: Eliquis      Strength: 5 mg         Qty: 3 bottles  LOT: [id number] Exp.Date: 6/22  3:16 PM 08/04/2018

## 2018-08-04 NOTE — Telephone Encounter (Signed)
Patient came for lab work today and is here now for an Echo.  The patient was stating during the lab visit that she can't afford the Eliquis 5 mg one tablet twice a day.  The cost per month is $396.00. The patient request the physician send for a tier exemption for the patient.

## 2018-08-05 LAB — CBC
HEMATOCRIT: 30.3 % — AB (ref 34.0–46.6)
HEMOGLOBIN: 10.2 g/dL — AB (ref 11.1–15.9)
MCH: 28.2 pg (ref 26.6–33.0)
MCHC: 33.7 g/dL (ref 31.5–35.7)
MCV: 84 fL (ref 79–97)
Platelets: 243 10*3/uL (ref 150–450)
RBC: 3.62 x10E6/uL — ABNORMAL LOW (ref 3.77–5.28)
RDW: 14.2 % (ref 12.3–15.4)
WBC: 8.8 10*3/uL (ref 3.4–10.8)

## 2018-08-05 LAB — BASIC METABOLIC PANEL
BUN / CREAT RATIO: 20 (ref 12–28)
BUN: 26 mg/dL (ref 8–27)
CO2: 24 mmol/L (ref 20–29)
CREATININE: 1.33 mg/dL — AB (ref 0.57–1.00)
Calcium: 9 mg/dL (ref 8.7–10.3)
Chloride: 102 mmol/L (ref 96–106)
GFR, EST AFRICAN AMERICAN: 47 mL/min/{1.73_m2} — AB (ref >59)
GFR, EST NON AFRICAN AMERICAN: 41 mL/min/{1.73_m2} — AB (ref >59)
Glucose: 107 mg/dL — ABNORMAL HIGH (ref 65–99)
Potassium: 4.9 mmol/L (ref 3.5–5.2)
SODIUM: 141 mmol/L (ref 134–144)

## 2018-08-07 NOTE — Telephone Encounter (Signed)
Patient calling to let us know a Prior Josem Kaufmann is required for Eliquis

## 2018-08-08 NOTE — Telephone Encounter (Signed)
I spoke with Amber Reyes and rep mentioned that pt does not require PA for Eliquis. Pt has to pay [employer] $396 dollars for her medication.  No PA required.

## 2018-08-08 NOTE — Telephone Encounter (Signed)
Attempted to contact pt's phone going straight to VM. Unable to leave VM at this time due to Full mailbox.

## 2018-08-21 NOTE — Telephone Encounter (Signed)
Patient returning call to discuss need for samples as her prior Josem Kaufmann has not been done   Patient informed of below and insists she needs a letter for Prior Auth   Patient calling the office for samples of medication:   1.  What medication and dosage are you requesting samples for? Eliquis 5 mg po BID   2.  Are you currently out of this medication? 1 week left

## 2018-08-21 NOTE — Telephone Encounter (Signed)
Please see notes below.

## 2018-08-22 MED ORDER — APIXABAN 5 MG PO TABS: 5.0000 mg | ORAL_TABLET | Freq: Two times a day (BID) | ORAL | 3 refills | Status: DC

## 2018-08-22 NOTE — Telephone Encounter (Signed)
Spoke to patient, according to note no prior auth req but pt reports she is unable to afford copay for medication. Pt request that tier exemption.  I have reached out to [employer] to see if there is a letter already prepared for tier exemption. Otherwise I started a [employer] patient assistance application. I left application at front desk for pt to sign along with 2 boxes of 5 mg Eliquis samples.  Lot # [id number] Exp 6/22

## 2018-08-29 ENCOUNTER — Ambulatory Visit: Payer: Medicare PPO | Admitting: Family Medicine

## 2018-08-30 ENCOUNTER — Encounter: Payer: Self-pay | Admitting: Family Medicine

## 2018-08-30 ENCOUNTER — Ambulatory Visit: Payer: Medicare PPO | Admitting: Family Medicine

## 2018-08-30 VITALS — BP 152/60 | HR 87 | Temp 97.6°F | Resp 16 | Ht 70.0 in | Wt 398.5 lb

## 2018-08-30 DIAGNOSIS — I272 Pulmonary hypertension, unspecified: Secondary | ICD-10-CM

## 2018-08-30 DIAGNOSIS — I4891 Unspecified atrial fibrillation: Secondary | ICD-10-CM

## 2018-08-30 DIAGNOSIS — E1122 Type 2 diabetes mellitus with diabetic chronic kidney disease: Secondary | ICD-10-CM

## 2018-08-30 DIAGNOSIS — Z599 Problem related to housing and economic circumstances, unspecified: Secondary | ICD-10-CM

## 2018-08-30 DIAGNOSIS — E1169 Type 2 diabetes mellitus with other specified complication: Secondary | ICD-10-CM | POA: Diagnosis not present

## 2018-08-30 DIAGNOSIS — I110 Hypertensive heart disease with heart failure: Secondary | ICD-10-CM | POA: Diagnosis not present

## 2018-08-30 DIAGNOSIS — J42 Unspecified chronic bronchitis: Secondary | ICD-10-CM | POA: Insufficient documentation

## 2018-08-30 DIAGNOSIS — I700 Atherosclerosis of aorta: Secondary | ICD-10-CM | POA: Diagnosis not present

## 2018-08-30 DIAGNOSIS — I10 Essential (primary) hypertension: Secondary | ICD-10-CM

## 2018-08-30 DIAGNOSIS — N183 Chronic kidney disease, stage 3 unspecified: Secondary | ICD-10-CM

## 2018-08-30 DIAGNOSIS — Z794 Long term (current) use of insulin: Secondary | ICD-10-CM | POA: Diagnosis not present

## 2018-08-30 DIAGNOSIS — Z1211 Encounter for screening for malignant neoplasm of colon: Secondary | ICD-10-CM

## 2018-08-30 DIAGNOSIS — E785 Hyperlipidemia, unspecified: Secondary | ICD-10-CM

## 2018-08-30 DIAGNOSIS — M109 Gout, unspecified: Secondary | ICD-10-CM

## 2018-08-30 DIAGNOSIS — E1129 Type 2 diabetes mellitus with other diabetic kidney complication: Secondary | ICD-10-CM

## 2018-08-30 DIAGNOSIS — J4540 Moderate persistent asthma, uncomplicated: Secondary | ICD-10-CM

## 2018-08-30 DIAGNOSIS — R809 Proteinuria, unspecified: Secondary | ICD-10-CM

## 2018-08-30 DIAGNOSIS — Z598 Other problems related to housing and economic circumstances: Secondary | ICD-10-CM

## 2018-08-30 DIAGNOSIS — G4730 Sleep apnea, unspecified: Secondary | ICD-10-CM

## 2018-08-30 LAB — POCT GLYCOSYLATED HEMOGLOBIN (HGB A1C)
HEMOGLOBIN A1C: 6.6 % (ref 4.0–5.6)
HEMOGLOBIN A1C: 6.6 % — AB (ref 4.0–5.6)
HbA1c, POC (controlled diabetic range): 6.6 % (ref 0.0–7.0)
HbA1c, POC (prediabetic range): 6.6 % — AB (ref 5.7–6.4)

## 2018-08-30 MED ORDER — DILTIAZEM HCL ER COATED BEADS 300 MG PO CP24: 300.0000 mg | ORAL_CAPSULE | Freq: Every day | ORAL | 1 refills | Status: DC

## 2018-08-30 MED ORDER — IRBESARTAN 300 MG PO TABS: 300.0000 mg | ORAL_TABLET | Freq: Every day | ORAL | 1 refills | Status: DC

## 2018-08-30 MED ORDER — FLUCONAZOLE 150 MG PO TABS: ORAL_TABLET | ORAL | 0 refills | Status: DC

## 2018-08-30 MED ORDER — FLUTICASONE-SALMETEROL 250-50 MCG/DOSE IN AEPB: 1.0000 | INHALATION_SPRAY | Freq: Two times a day (BID) | RESPIRATORY_TRACT | 1 refills | Status: DC

## 2018-08-30 MED ORDER — FUROSEMIDE 40 MG PO TABS: 40.0000 mg | ORAL_TABLET | Freq: Two times a day (BID) | ORAL | 1 refills | Status: DC | PRN

## 2018-08-30 MED ORDER — ALLOPURINOL 100 MG PO TABS: 100.0000 mg | ORAL_TABLET | Freq: Two times a day (BID) | ORAL | 1 refills | Status: DC

## 2018-08-30 MED ORDER — PRAVASTATIN SODIUM 80 MG PO TABS: 80.0000 mg | ORAL_TABLET | Freq: Every evening | ORAL | 1 refills | Status: DC

## 2018-08-30 MED ORDER — DULAGLUTIDE 1.5 MG/0.5ML ~~LOC~~ SOAJ: 1.5000 mg | SUBCUTANEOUS | 1 refills | Status: DC

## 2018-08-30 MED ORDER — INSULIN LISPRO PROT & LISPRO (75-25 MIX) 100 UNIT/ML KWIKPEN: 25.0000 [IU] | PEN_INJECTOR | Freq: Two times a day (BID) | SUBCUTANEOUS | 1 refills | Status: DC

## 2018-08-30 NOTE — Progress Notes (Signed)
Name: Amber Reyes   MRN: [id number]    DOB: Dec 17, 1945   Date:08/30/2018       Progress Note  Subjective  Chief Complaint  Chief Complaint  Patient presents with  . Diabetes  . Hypertension  . Asthma    HPI  DMII controlled: She has been compliant with medication, on Humalog 75/25 60 units in am and 30 at night since Feb 2018, also started on Trulicity in 8921 and [id number] been at [facility] today is 6.6%. Taking Avapro for nephropathy, still has some mild microalbuminuria, last level 45. No polyphagia, polydipsia or polyuria. She states difficulty paying for Trulicity and Insulin we will place referral to chronic care management. She states has noticed some episodes of hypoglycemia during the night when she does not eat enough during the day. We will change to 55 units in am and 25 units at night.   HTN/CHF: taking medications and denies side effects of medications, bp is at goal, she denies chest painno recentpalpitation, found to have Afib on EKG done by Dr. Fletcher Anon - she was not wearing CPAP consistently at the time  At home bp130's/60'sShe has orthopnea, SOB is stable, and edema has been stable.  Echo done 06/2018 LV function is normal and unable to assess diastolic dysfunction, severe left  Atrium dilation , mild elevation of Pulmonary pressure  Hyperlipidemia: taking Pravastatin, we will continue current medication, reviewed labs done 11/2017, LDL at goal. No myalgia   RLS: symptoms are still present, described as soreness on both legs at night, improves when she rubs her legs. She was having cramps after taking lasix, but is currently taking lasix every other day and has been doing better. Unchanged    CKIII: [facility], last labs reviewed still stage III CKI with mild drop of GFR , no itching , good urine output. Last GFR has been stable   OSA: she has been wearing CPAP machineevery night now, better compliance since she got machine  adjusted by Dr. Felicie Morn  Morbid Obesity: her weight has gone up a little, but states ate more during holiday season and has not been as active because it is cold outside. She went to a bariatric surgery consultation, but afraid to have surgery, she states her daughter had the surgeryand is doing well, but she is personally scared of having surgery.   Asthma Moderate persistent/Chronic bronchitis: one flare back in 09/2016. She is compliant and uses Advair twice daily as prescribed, no side effects. . No wheezing or SOB at this time, she states worse during the Summer with humidity and pollen  Afib : on Cardizem CD. Eliquis and off aspirin . She was given Eliquis back in 2015 but did not start because of cost, but is on samples now and trying to get forms filled out at Dr. Tyrell Antonio office to be able to afford medication. .Seen by Dr. Fletcher Anon in 2015 and again in 2017 after admission for CHF. Last Echo was done 08/04/2018 60-65%, EKG 06/2018 showed afib  Atherosclerosis of aorta and echo showed mild dilation of ascending aorta, continue statin and eliquis   Patient Active Problem List   Diagnosis Date Noted  . Thoracic aortic atherosclerosis ([place]) 12/30/2016  . Controlled type 2 diabetes mellitus with stage 3 chronic kidney disease ([place]) 12/23/2016  . Acute on chronic diastolic congestive heart failure ([place]) 05/21/2016  . Venous insufficiency 05/21/2016  . Hypertensive heart disease 05/21/2016  . Anemia in chronic kidney disease 12/22/2015  .  Pulmonary hypertension ([place]) 12/18/2015  . 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 Anxiety 04/12/2015  . Chronic kidney disease (CKD), stage III (moderate) ([place]) 04/12/2015  . Edema extremities 04/12/2015  . 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 Asthma, moderate persistent 04/12/2015  . Morbid obesity, unspecified obesity type ([place]) 04/12/2015  . Obstructive apnea 04/12/2015  . Restless leg 04/12/2015  . Allergic rhinitis 04/12/2015  . Vitamin D  deficiency 04/12/2015  . Controlled gout 04/12/2015  . Premature atrial contractions 11/29/2013  . Benign essential hypertension   . Hyperlipidemia     Past Surgical History:  Procedure Laterality Date  . CARDIAC CATHETERIZATION  2002   [facility]  . PERICARDIUM SURGERY      Family History  Problem Relation Age of Onset  . Heart attack Mother   . Hypertension Mother     Social History   Socioeconomic History  . Marital status: Married    Spouse name: Not on file  . Number of children: 3  . Years of education: Not on file  . Highest education level: Associate degree: academic program  Occupational History  . Occupation: retired  Scientific laboratory technician  . Financial resource strain: Not hard at all  . Food insecurity:    Worry: Never true    Inability: Never true  . Transportation needs:    Medical: No    Non-medical: No  Tobacco Use  . Smoking status: Never Smoker  . Smokeless tobacco: Never Used  . Tobacco comment: n/a  Substance and Sexual Activity  . Alcohol use: No    Alcohol/week: 0.0 standard drinks  . Drug use: No  . Sexual activity: Not Currently  Lifestyle  . Physical activity:    Days per week: 0 days    Minutes per session: Not on file  . Stress: Only a little  Relationships  . Social connections:    Talks on phone: More than three times a week    Gets together: Once a week    Attends religious service: More than 4 times per year    Active member of club or organization: Yes    Attends meetings of clubs or organizations: More than 4 times per year    Relationship status: Married  . Intimate partner violence:    Fear of current or ex partner: No    Emotionally abused: No    Physically abused: No    Forced sexual activity: No  Other Topics Concern  . Not on file  Social History Narrative  . Not on file     Current Outpatient Medications:  .  albuterol (PROVENTIL HFA;VENTOLIN HFA) 108 (90 BASE) MCG/ACT inhaler, Inhale 2 puffs into the lungs every 6 (six)  hours as needed for wheezing or shortness of breath., Disp: , Rfl:  .  allopurinol (ZYLOPRIM) 100 MG tablet, Take 1 tablet (100 mg total) by mouth 2 (two) times daily., Disp: 180 tablet, Rfl: 1 .  apixaban (ELIQUIS) 5 MG TABS tablet, Take 1 tablet (5 mg total) by mouth 2 (two) times daily., Disp: 180 tablet, Rfl: 3 .  Blood Glucose Calibration (TRUE METRIX LEVEL 1) Low SOLN, 1 each by In Vitro route once a week., Disp: 1 each, Rfl: 1 .  Blood Glucose Monitoring Suppl (TRUE METRIX AIR GLUCOSE METER) w/Device KIT, 1 kit by Does not apply route 3 (three) times daily., Disp: 1 kit, Rfl: 0 .  Cholecalciferol (VITAMIN D) 2000 UNITS tablet, Take 2,000 Units by mouth daily., Disp: , Rfl:  .  colchicine (COLCRYS)  0.6 MG tablet, Take 1-2 tablets (0.6-1.2 mg total) by mouth daily as needed., Disp: 30 tablet, Rfl: 0 .  diltiazem (CARDIZEM CD) 300 MG 24 hr capsule, Take 1 capsule (300 mg total) by mouth daily., Disp: 90 capsule, Rfl: 1 .  Dulaglutide (TRULICITY) 1.5 WT/8.8EK SOPN, Inject 1.5 mg into the skin once a week., Disp: 6 mL, Rfl: 1 .  Elastic Bandages & Supports (MEDICAL COMPRESSION STOCKINGS) MISC, 2 each by Does not apply route daily., Disp: 2 each, Rfl: 2 .  fluconazole (DIFLUCAN) 150 MG tablet, TAKE ONE TABLET BY MOUTH EVERY OTHER DAY AS NEEDED FOR  YEAST  INFECTION, Disp: 4 tablet, Rfl: 0 .  fluticasone (FLONASE) 50 MCG/ACT nasal spray, Place 2 sprays into both nostrils daily., Disp: 48 g, Rfl: 2 .  Fluticasone-Salmeterol (ADVAIR DISKUS) 250-50 MCG/DOSE AEPB, Inhale 1 puff into the lungs 2 (two) times daily., Disp: 3 each, Rfl: 1 .  furosemide (LASIX) 40 MG tablet, Take 1 tablet (40 mg total) by mouth 2 (two) times daily as needed., Disp: 180 tablet, Rfl: 1 .  Insulin Lispro Prot & Lispro (HUMALOG MIX 75/25 KWIKPEN) (75-25) 100 UNIT/ML Kwikpen, Inject 38-60 Units into the skin 2 (two) times daily., Disp: 15 pen, Rfl: 1 .  Insulin Pen Needle (NOVOFINE) 30G X 8 MM MISC, Inject 10 each into the skin  daily., Disp: 100 each, Rfl: 1 .  irbesartan (AVAPRO) 300 MG tablet, Take 1 tablet (300 mg total) by mouth daily., Disp: 90 tablet, Rfl: 1 .  pravastatin (PRAVACHOL) 80 MG tablet, Take 1 tablet (80 mg total) by mouth every evening., Disp: 90 tablet, Rfl: 1 .  TRUE METRIX BLOOD GLUCOSE TEST test strip, USE  AS  INSTRUCTED, Disp: 300 each, Rfl: 1 .  TRUEPLUS LANCETS 33G MISC, 1 each by Does not apply route 3 (three) times daily., Disp: 300 each, Rfl: 2  No Known Allergies  I personally reviewed active problem list, medication list, allergies, family history, social history with the patient/caregiver today.   ROS  Constitutional: Negative for fever , positive for mild  weight change - gain .  Respiratory: Negative for cough , stable shortness of breath.   Cardiovascular: Negative for chest pain or palpitations.  Gastrointestinal: Negative for abdominal pain, no bowel changes.  Musculoskeletal: Positive for gait problem ( secondary to low back pain and hip pain) but no joint swelling.  Skin: Negative for rash.  Neurological: Negative for dizziness or headache.  No other specific complaints in a complete review of systems (except as listed in HPI above).   Objective  Vitals:   08/30/18 0731  BP: (!) 152/60  Pulse: 87  Resp: 16  Temp: 97.6 F (36.4 C)  TempSrc: Oral  SpO2: 98%  Weight: (!) 398 lb 8 oz (180.8 kg)  Height: '5\' 10"'  (1.778 m)    Body mass index is 57.18 kg/m.  Physical Exam  Constitutional: Patient appears well-developed and well-nourished. Obese  No distress.  HEENT: head atraumatic, normocephalic, pupils equal and reactive to light, , neck supple, throat within normal limits Cardiovascular: Normal rate, regular rhythm with extra beats today, does not seem to be in afib at this time. No murmur heard. Trace  BLE edema. Pulmonary/Chest: Effort normal and breath sounds normal. No respiratory distress. Abdominal: Soft.  There is no tenderness. Psychiatric: Patient  has a normal mood and affect. behavior is normal. Judgment and thought content normal.  Recent Results (from the past 2160 hour(s))  CBC     Status: Abnormal  Collection Time: 08/04/18  2:07 PM  Result Value Ref Range   WBC 8.8 3.4 - 10.8 x10E3/uL   RBC 3.62 (L) 3.77 - 5.28 x10E6/uL   Hemoglobin 10.2 (L) 11.1 - 15.9 g/dL   Hematocrit 30.3 (L) 34.0 - 46.6 %   MCV 84 79 - 97 fL   MCH 28.2 26.6 - 33.0 pg   MCHC 33.7 31.5 - 35.7 g/dL   RDW 14.2 12.3 - 15.4 %    Comment: **Effective August 28, 2018, the RDW pediatric reference**   interval will be removed and the adult reference interval   will be changing to:                             Female 11.7 - 15.4                                                      Female 11.6 - 15.4    Platelets 243 150 - 450 O96E9/BM  Basic metabolic panel     Status: Abnormal   Collection Time: 08/04/18  2:07 PM  Result Value Ref Range   Glucose 107 (H) 65 - 99 mg/dL   BUN 26 8 - 27 mg/dL   Creatinine, Ser 1.33 (H) 0.57 - 1.00 mg/dL   GFR calc non Af Amer 41 (L) >59 mL/min/1.73   GFR calc Af Amer 47 (L) >59 mL/min/1.73   BUN/Creatinine Ratio 20 12 - 28   Sodium 141 134 - 144 mmol/L   Potassium 4.9 3.5 - 5.2 mmol/L   Chloride 102 96 - 106 mmol/L   CO2 24 20 - 29 mmol/L   Calcium 9.0 8.7 - 10.3 mg/dL  POCT HgB A1C     Status: Abnormal   Collection Time: 08/30/18  7:46 AM  Result Value Ref Range   Hemoglobin A1C 6.6 (A) 4.0 - 5.6 %   HbA1c POC (<> result, manual entry) 6.6 4.0 - 5.6 %   HbA1c, POC (prediabetic range) 6.6 (A) 5.7 - 6.4 %   HbA1c, POC (controlled diabetic range) 6.6 0.0 - 7.0 %      PHQ2/9: Depression screen [facility] 2/9 04/25/2018 08/24/2017 03/25/2017 12/30/2016 09/28/2016  Decreased Interest 0 0 0 0 0  Down, Depressed, Hopeless 0 0 0 0 0  PHQ - 2 Score 0 0 0 0 0     Fall Risk: Fall Risk  08/30/2018 11/25/2017 08/24/2017 07/27/2017 03/25/2017  Falls in the past year? 0 No No No No  Number falls in past yr: 0 - - - -  Injury with Fall? 0 - - - -      Assessment & Plan  1. Controlled type 2 diabetes mellitus with stage 3 chronic kidney disease, with long-term current use of insulin (HCC)  - POCT HgB A1C - Ambulatory referral to Chronic Care Management Services  2. Hypertensive heart disease with heart failure (HCC)  - furosemide (LASIX) 40 MG tablet; Take 1 tablet (40 mg total) by mouth 2 (two) times daily as needed.  Dispense: 180 tablet; Refill: 1  3. Dyslipidemia associated with type 2 diabetes mellitus (HCC)  - pravastatin (PRAVACHOL) 80 MG tablet; Take 1 tablet (80 mg total) by mouth every evening.  Dispense: 90 tablet; Refill: 1  4. Pulmonary hypertension ([place])  She is compliant  with CPAP machine  5. Thoracic aortic atherosclerosis (HCC)  On statin and Eliquis   6. Chronic kidney disease (CKD), stage III (moderate) (HCC)  Reviewed labs done by Dr. Fletcher Anon  7. Morbid obesity ([place])  Discussed with the patient the risk posed by an increased BMI. Discussed importance of portion control, calorie counting and at least 150 minutes of physical activity weekly. Avoid sweet beverages and drink more water. Eat at least 6 servings of fruit and vegetables daily   8. Chronic bronchitis, unspecified chronic bronchitis type ([place])  Still has a morning cough, but not daily, stable SOB   9. Benign essential hypertension  - irbesartan (AVAPRO) 300 MG tablet; Take 1 tablet (300 mg total) by mouth daily.  Dispense: 90 tablet; Refill: 1  10. Atrial fibrillation, unspecified type ([place])  On Eliquis and rate is controlled  11. Colon cancer screening  - Cologuard  12. Financial difficulties  - Ambulatory referral to Chronic Care Management Services  13. Sleep apnea, unspecified type  Machine adjusted by Dr. Felicie Morn  14. Controlled gout  - allopurinol (ZYLOPRIM) 100 MG tablet; Take 1 tablet (100 mg total) by mouth 2 (two) times daily.  Dispense: 180 tablet; Refill: 1  15. Controlled type 2 diabetes mellitus with  microalbuminuria, with long-term current use of insulin (HCC)  - Dulaglutide (TRULICITY) 1.5 VZ/8.5YI SOPN; Inject 1.5 mg into the skin once a week.  Dispense: 6 mL; Refill: 1  16. Moderate persistent asthma without complication  - Fluticasone-Salmeterol (ADVAIR DISKUS) 250-50 MCG/DOSE AEPB; Inhale 1 puff into the lungs 2 (two) times daily.  Dispense: 3 each; Refill: 1

## 2018-09-01 ENCOUNTER — Ambulatory Visit: Payer: Self-pay

## 2018-09-01 DIAGNOSIS — I110 Hypertensive heart disease with heart failure: Secondary | ICD-10-CM

## 2018-09-01 DIAGNOSIS — Z794 Long term (current) use of insulin: Principal | ICD-10-CM

## 2018-09-01 DIAGNOSIS — N183 Chronic kidney disease, stage 3 (moderate): Principal | ICD-10-CM

## 2018-09-01 DIAGNOSIS — E1122 Type 2 diabetes mellitus with diabetic chronic kidney disease: Secondary | ICD-10-CM

## 2018-09-01 NOTE — Chronic Care Management (AMB) (Signed)
  Chronic Care Management   Note  09/01/2018 Name: Amber Reyes MRN: [id number] DOB: 1946/08/03   Amber Reyes is a 73 year old female who sees Dr. Steele Sizer for primary care. Dr. Ancil Boozer asked the CCM team to consult the patient for assistance with chronic disease management related to HTN, Pulmonary HTN, DM, Hyperlipidemia, and CKD. Patient also needs assistance with medication afordibility. Referral was placed 08/30/2018. Telephone outreach to patient today to introduce CCM services.   Was unable to reach patient via telephone today. Unfortunately I was unable to leave a voicemail message (unsuccessful outreach #1).  Plan: Will follow-up within 3-5  business days via telephone.    Trystan Eads E. Rollene Rotunda, RN, BSN Nurse Care Coordinator [facility] / [facility] Care Management  [phone]

## 2018-09-05 ENCOUNTER — Telehealth: Payer: Self-pay | Admitting: *Deleted

## 2018-09-05 ENCOUNTER — Telehealth: Payer: Self-pay | Admitting: Cardiovascular Disease

## 2018-09-05 ENCOUNTER — Ambulatory Visit: Payer: Self-pay

## 2018-09-05 DIAGNOSIS — Z794 Long term (current) use of insulin: Principal | ICD-10-CM

## 2018-09-05 DIAGNOSIS — N183 Chronic kidney disease, stage 3 (moderate): Principal | ICD-10-CM

## 2018-09-05 DIAGNOSIS — I110 Hypertensive heart disease with heart failure: Secondary | ICD-10-CM

## 2018-09-05 DIAGNOSIS — E1122 Type 2 diabetes mellitus with diabetic chronic kidney disease: Secondary | ICD-10-CM

## 2018-09-05 NOTE — Telephone Encounter (Signed)
Please call regarding Eliquis samples, states she will not have any after Thursday

## 2018-09-05 NOTE — Telephone Encounter (Signed)
Pt requiring PA for hydralazine 25 mg tablet.  PA submitted through Covermymeds.

## 2018-09-05 NOTE — Telephone Encounter (Signed)
Lot: [id number] 01/2021

## 2018-09-05 NOTE — Patient Instructions (Signed)
1. Thank You for allowing the CCM (Chronic Care Management) Team to assist you with your healthcare goals!! We look forward to meeting you on 09/21/2018 at 9:00am 2. Please bring ALL medications to your appointment! If you have a blood sugar meter or a blood pressure monitor at home, bring those as well.  3.  Contact the CCM Team if you have any question or need to reschedule your initial visit.  CCM (Chronic Care Management) Team   Trish Fountain RN, BSN Nurse Care Coordinator  [phone]  Ruben Reason PharmD  Clinical Pharmacist  [phone]   Amber Reyes was given information about Chronic Care Management services today including:  1. CCM service includes personalized support from designated clinical staff supervised by her physician, including individualized plan of care and coordination with other care providers 2. 24/7 contact phone numbers for assistance for urgent and routine care needs. 3. Service will only be billed when office clinical staff spend 20 minutes or more in a month to coordinate care. 4. Only one practitioner may furnish and bill the service in a calendar month. 5. The patient may stop CCM services at any time (effective at the end of the month) by phone call to the office staff. 6. The patient will be responsible for cost sharing (co-pay) of up to 20% of the service fee (after annual deductible is met).  Patient agreed to services and verbal consent obtained.

## 2018-09-05 NOTE — Telephone Encounter (Signed)
I have placed samples of Eliquis 5 mg tablet at front desk for pick up.

## 2018-09-05 NOTE — Chronic Care Management (AMB) (Signed)
  Chronic Care Management   Note  09/05/2018 Name: Amber Reyes MRN: [id number] DOB: May 20, 1946  Amber Reyes is a 73 year old female who sees Dr. Laure Kidney primary care. Dr. Ancil Boozer asked the CCM team to consult the patient for assistance with chronic disease management related toHTN, Pulmonary HTN, DM, Hyperlipidemia, and CKD. Patient also needs assistance with medication afordibility. Referral was placed1/03/2019. Telephone outreach to patient today to introduce CCM services.  Amber Reyes agreed that CCM services would be beneficial to her.  Amber Reyes was given information about Chronic Care Management services today including:  1. CCM service includes personalized support from designated clinical staff supervised by her physician, including individualized plan of care and coordination with other care providers 2. 24/7 contact phone numbers for assistance for urgent and routine care needs. 3. Service will only be billed when office clinical staff spend 20 minutes or more in a month to coordinate care. 4. Only one practitioner may furnish and bill the service in a calendar month. 5. The patient may stop CCM services at any time (effective at the end of the month) by phone call to the office staff. 6. The patient will be responsible for cost sharing (co-pay) of up to 20% of the service fee (after annual deductible is met).  Patient agreed to services and verbal consent obtained.   Plan: Appointment for face to face encounter with  CCM Team scheduled for 09/21/2018 at [place]. Rollene Rotunda, RN, BSN Nurse Care Coordinator [facility] / [facility] Care Management  [phone]

## 2018-09-07 ENCOUNTER — Telehealth: Payer: Self-pay | Admitting: *Deleted

## 2018-09-07 DIAGNOSIS — Z1211 Encounter for screening for malignant neoplasm of colon: Secondary | ICD-10-CM | POA: Diagnosis not present

## 2018-09-07 NOTE — Telephone Encounter (Signed)
Call placed to the patient to inform her that we need her signature for the Eliquis Assistance forms. She stated that she will come by the office tomorrow.

## 2018-09-08 NOTE — Telephone Encounter (Signed)
Patient will come to office to sign today at 50

## 2018-09-11 ENCOUNTER — Other Ambulatory Visit: Payer: Self-pay | Admitting: Family Medicine

## 2018-09-11 ENCOUNTER — Encounter: Payer: Self-pay | Admitting: Family Medicine

## 2018-09-11 DIAGNOSIS — R195 Other fecal abnormalities: Secondary | ICD-10-CM

## 2018-09-11 LAB — COLOGUARD: Cologuard: POSITIVE — AB

## 2018-09-11 NOTE — Progress Notes (Signed)
Patient has been notified regarding her positive Cologuard and a referral was made for GI for further testing.

## 2018-09-11 NOTE — Telephone Encounter (Signed)
Assistance forms successfully faxed.

## 2018-09-13 ENCOUNTER — Other Ambulatory Visit: Payer: Self-pay | Admitting: Family Medicine

## 2018-09-14 ENCOUNTER — Telehealth: Payer: Self-pay | Admitting: Cardiovascular Disease

## 2018-09-14 ENCOUNTER — Other Ambulatory Visit: Payer: Self-pay

## 2018-09-14 ENCOUNTER — Telehealth: Payer: Self-pay

## 2018-09-14 DIAGNOSIS — R195 Other fecal abnormalities: Secondary | ICD-10-CM

## 2018-09-14 NOTE — Telephone Encounter (Signed)
Last seen 07/14/18

## 2018-09-14 NOTE — Telephone Encounter (Signed)
Pt is calling to schedule a colonoscopy please call her cell at  cb [phone]

## 2018-09-14 NOTE — Telephone Encounter (Signed)
Moderate risk given sleep apnea and other co-morbidities. Hold eliquis 2 days before.

## 2018-09-14 NOTE — Telephone Encounter (Signed)
Patient's call has been returned to schedule her colonoscopy.  She has been scheduled for 09/25/18 at [facility] with Dr. Vicente Males.  She is currently on Eliquis prescribed by Dr. Fletcher Anon.  Blood Thinner request has been faxed to his office.  Thanks [employer]

## 2018-09-14 NOTE — Telephone Encounter (Signed)
   [facility] Pre-operative Risk Assessment    Request for surgical clearance:  1. What type of surgery is being performed? Colonoscopy  2. When is this surgery scheduled? 09/25/18   3. What type of clearance is required (medical clearance vs. Pharmacy clearance to hold med vs. Both)? pharmacy  4. Are there any medications that need to be held prior to surgery and how long? Eliquis  5. Practice name and name of physician performing surgery? Dr. Vicente Males, [facility]  6. What is your office phone number? [phone]    7.   What is your office fax number [phone]  8.   Anesthesia type (None, local, MAC, general) ? Not listed   Lucienne Minks 09/14/2018, 11:24 AM  _________________________________________________________________   (provider comments below)

## 2018-09-15 NOTE — Telephone Encounter (Signed)
The patient has been called and notified to hold Eliquis 2 days prior and has verbalized her understanding.  Clearance has been successfully faxed to [phone]

## 2018-09-18 ENCOUNTER — Telehealth: Payer: Self-pay

## 2018-09-18 NOTE — Telephone Encounter (Signed)
Follow up  Call  Made to patient to make sure she received and understood her blood thinner advice as advised by Dr. Tyrell Antonio nurse Lattie Haw.  Patient said Lattie Haw has advised her to stop the Eliquis 2 days prior to her colonoscopy.  She had no concerns regarding the colonoscopy procedure.  Asked her to give me a call back her at the office if she had any questions.  Thanks [employer]

## 2018-09-19 NOTE — Progress Notes (Signed)
Charted in error.

## 2018-09-21 ENCOUNTER — Ambulatory Visit (INDEPENDENT_AMBULATORY_CARE_PROVIDER_SITE_OTHER): Payer: Medicare PPO | Admitting: Pharmacist

## 2018-09-21 ENCOUNTER — Telehealth: Payer: Self-pay | Admitting: Cardiovascular Disease

## 2018-09-21 DIAGNOSIS — N183 Chronic kidney disease, stage 3 (moderate): Secondary | ICD-10-CM | POA: Diagnosis not present

## 2018-09-21 DIAGNOSIS — I110 Hypertensive heart disease with heart failure: Secondary | ICD-10-CM

## 2018-09-21 DIAGNOSIS — Z794 Long term (current) use of insulin: Secondary | ICD-10-CM

## 2018-09-21 DIAGNOSIS — E1122 Type 2 diabetes mellitus with diabetic chronic kidney disease: Secondary | ICD-10-CM

## 2018-09-21 NOTE — Telephone Encounter (Signed)
Patient requesting samples for Eliquis 5 mg po BID until her next scheduled appointment. Eliquis 5mg  Lot # [id number] exp jun 2022,3 boxes 14 tablets each  Samples were placed at the front desk and patient notified they were ready.

## 2018-09-21 NOTE — Chronic Care Management (AMB) (Signed)
Chronic Care Management   Initial Visit Note  09/21/2018 Name: Amber Reyes MRN: [id number] DOB: 1946-08-11  Subjective: "I really need to loose this weight"  Objective:  BP Readings from Last 3 Encounters:  08/30/18 (!) 152/60  07/19/18 114/70  07/14/18 140/60   Lab Results  Component Value Date   HGBA1C 6.6 (A) 08/30/2018   HGBA1C 6.6 08/30/2018   HGBA1C 6.6 (A) 08/30/2018   HGBA1C 6.6 08/30/2018    Assessment: Amber Reyes a 73year old female who sees Dr. Laure Kidney primary care.Dr. Lenox Ahr the CCM team to consult the patient for assistance with chronic disease management related toHTN, Pulmonary HTN, DM, Hyperlipidemia, and CKD. Patient also needs assistance with medication afordibility. Referral was placed1/03/2019. Amber Reyes agreed to [facility] and met with the CCM Team today to establishes health goals.   0 ED Visits/0 Hospitalizations in the last 6 months  <no information>   Goals Addressed    . "I don't understand my lung disease" (pt-stated)       Current Barriers:   Knowledge deficit related to obstructive sleep apnea and chronic bronchitis  Nurse Case Manager Clinical Goal(s): Over the next 14 days, patient will verbalize better understanding of lung disease   Interventions:  . Provided counseling and education regarding the differences between sleep apnea and chronic bronchitis . Educated patient on disease mechanisms of both OSA and chronic lung disease . Demonstrated pursed lip breathing . Provided patient with COPD (chronic bronchitis) action plan and review importance of MD notification when in yellow zone . Discussed importance of using maintenance inhaler . Discussed weight loss as a benefit to improve OSA and shortness of breath  Patient Self Care Activities:  . Practice pursed lip breathing . Take medications as prescribed and notify MD of medication is not affordable (please consider switching to [facility] if MD  approves)   *initial goal documentation     . "I really want to loose this weight" (pt-stated)       Current Barriers:  Marland Kitchen Knowledge deficit related to how to loose weight   Nurse Case Manager Clinical Goal(s): Over the next 14 days, patient will consider referral to nutritionist/weight loss clinic  Interventions:  . Provided emotional support and reassurance . Discussed how excess weight contributes to chronic diseases . Reviewed options for weight loss including referral to [facility] nutrition  Patient Self Care Activities:  . Patient will request referral to [facility] nutritionist   *initial goal documentation     . "Some of my medications are really expensive and I can't afford" (pt-stated)       Current Barriers:  . financial  Pharmacist Clinical Goal(s): Over the next 14 days, Amber Reyes will provide the necessary supplementary documents (proof of out of pocket prescription expenditure, proof of household income) needed for medication assistance applications to CCM pharmacist.   Interventions: . CCM pharmacist will apply for medication assistance program for Trulicity and Humalog made by [employer]. . Discuss different options for "maintenance" inhaler due to Advair cost.   . Dr. Reino Bellis office is pursuing medication assistance for Eliquis.  Patient Self Care Activities:  Marland Kitchen Gather necessary documents needed to apply for medication assistance  *initial goal documentation          Follow up plan:  Telephone follow up appointment with CCM team member scheduled for: 2 weeks   Amber Reyes was given information about Chronic Care Management services today including:  1. CCM service includes personalized support from designated clinical staff  supervised by her physician, including individualized plan of care and coordination with other care providers 2. 24/7 contact phone numbers for assistance for urgent and routine care needs. 3. Service will only be billed when office  clinical staff spend 20 minutes or more in a month to coordinate care. 4. Only one practitioner may furnish and bill the service in a calendar month. 5. The patient may stop CCM services at any time (effective at the end of the month) by phone call to the office staff. 6. The patient will be responsible for cost sharing (co-pay) of up to 20% of the service fee (after annual deductible is met).  Patient agreed to services and verbal consent obtained.    Tyjae Issa E. Rollene Rotunda, RN, BSN Nurse Care Coordinator [facility] Care Management  [phone]

## 2018-09-21 NOTE — Patient Instructions (Signed)
Thank you allowing the Chronic Care Management Team to be a part of your care! It was a pleasure speaking with you today!  1. Please use your [employer] OTC Catalogue for your over the counter needs. It will reduce your out of pocket expense. 2. Discuss with Dr. Ashby Dawes and Dr. Ancil Boozer if Amber Reyes would be an option for you as a maintenance inhaler because Advair is difficult to afford.  3. Continue to take all your medications as prescribed 4. Continue to check your blood sugars daily. We will discuss diabetes at our next encounter 5. Continue to check your blood pressure daily. We will discuss at our next encounter 6. Review, practice, and utilize pursed lip breathing. It will help decrease your shortness of breath with exertion  CCM (Chronic Care Management) Team   Trish Fountain RN, BSN Nurse Care Coordinator  [phone]  Ruben Reason PharmD  Clinical Pharmacist  [phone]   Goals Addressed            This Visit's Progress   . "I don't understand my lung disease" (pt-stated)       Current Barriers:   Knowledge deficit related to obstructive sleep apnea and chronic bronchitis  Nurse Case Manager Clinical Goal(s): Over the next 14 days, patient will verbalize better understanding of lung disease   Interventions:  . Provided counseling and education regarding the differences between sleep apnea and chronic bronchitis . Educated patient on disease mechanisms of both OSA and chronic lung disease . Demonstrated pursed lip breathing . Provided patient with COPD (chronic bronchitis) action plan and review importance of MD notification when in yellow zone . Discussed importance of using maintenance inhaler . Discussed weight loss as a benefit to improve OSA and shortness of breath  Patient Self Care Activities:  . Practice pursed lip breathing .   *initial goal documentation     . "Some of my medications are really expensive and I can't afford" (pt-stated)        Current Barriers:  . financial  Pharmacist Clinical Goal(s): Over the next 14 days, [name] will provide the necessary supplementary documents (proof of out of pocket prescription expenditure, proof of household income) needed for medication assistance applications to CCM pharmacist.   Interventions: . CCM pharmacist will apply for medication assistance program for Trulicity and Humalog made by [employer]. . Discuss different options for "maintenance" inhaler due to Advair cost.   . Dr. Reino Bellis office is pursuing medication assistance for Eliquis.  Patient Self Care Activities:  Marland Kitchen Gather necessary documents needed to apply for medication assistance  *initial goal documentation         Print copy of patient instructions provided.   Telephone follow up appointment with CCM team member scheduled for:   [name] was given information about Chronic Care Management services today including:  1. CCM service includes personalized support from designated clinical staff supervised by her physician, including individualized plan of care and coordination with other care providers 2. 24/7 contact phone numbers for assistance for urgent and routine care needs. 3. Service will only be billed when office clinical staff spend 20 minutes or more in a month to coordinate care. 4. Only one practitioner may furnish and bill the service in a calendar month. 5. The patient may stop CCM services at any time (effective at the end of the month) by phone call to the office staff. 6. The patient will be responsible for cost sharing (co-pay) of up to 20% of the service  fee (after annual deductible is met).  Patient agreed to services and verbal consent obtained.    COPD ACTION PLAN Actions to Take if My Symptoms Get Worse   WHAT ZONE ARE YOU IN TODAY? Green, Yellow, or Red?    GREEN ZONE: I am doing well today.   Symptoms Actions .  Usual activity and exercise level . Usual amounts of cough or  phlegm/musuc . Sleep well at night . Appetite is good . Continue to take daily "maintenance" medicines (the ones you take "no matter what" until your doctor adjusts them for you) . Use oxygen as prescribed . Continue regular exercise/diet plan . At all times avoid cigarette smoke, inhaled iritants     YELLOW ZONE: I am having a bad day or a COPD flare.  Symptoms Actions .  More breathless than usual . I have less energy for my daily activities . Increased or thicker phlegm/mucus . Using quick relief inhaler/nebulizer more often . More coughing than usual . I feel like I have a "chest cold" . Poor sleep and my symptoms woke me up . My appetite is not good . My medicine is not helping . Swelling of ankles more than usual . Continue daily medications . Use quick relief inhaler or nebulizer every 4 hours . Use oxygen as prescribed . Get plenty of rest . Use pursed lip breathing . At all times avoid cigarette smoke, inhaled irritants.  Marland Kitchen CALL PROVIDER for same day or next day appointment for the following:  o If symptoms are not improving within 48 hours of onset or o If symptoms are not improving after quick relief inhaler or nebulizer o Start an oral corticosteroid (specify name, dose, and duration) . Start an antibiotic (specify name, dose, duration)    RED ZONE: I NEED URGENT MEDICAL CARE  Symptoms Actions .  Severe shortness of breath even at rest . Severe shortness of breath even after quick relief inhaler or nebulizer . Not able to lay flat because of breathing . Fever or shaking chills . Feeling confused or very drowsy . Chest pains . Coughin up blood . Quick relief inhaler or nebulizer not effective . Call 911 or have someone take you to the nearest emergency room . Call provider for now or same day appointment     3-2-1 PLAN: If any of the 3 main COPD symptoms (Cough, Shortness of Breath, or Mucus Production) change for 2 days or more, your number 1 priority if to  call your doctor.    Pursed Lip Breathing Pursed lip breathing is a technique to relieve the feeling of being short of breath. Some long-term respiratory conditions, like chronic obstructive pulmonary disease (COPD) and severe asthma, can make it hard to breathe out (exhale) all of the air in your lungs. This can make air that has less oxygen than normal build up in your lungs (air trapping). Trapped air means your lungs fill with less fresh air when you breathe in (inhale). As a result, you feel short of breath. Pursed lip breathing keeps your airways open longer when you exhale and empties more air from your lungs. This makes more space for fresh air when you inhale. Pursed lip breathing can also slow down your breathing and help your body not have to work so hard to breathe. Over time, pursed lip breathing may help you be able to be more physically active and do more activities. How to perform pursed lip breathing Being short of breath can make you  tense and anxious. Before you start this breathing exercise, take a minute to relax your shoulders and close your eyes. Then: 1. Start the exercise by closing your mouth. 2. Breathe in through your nose, taking a normal breath. You can do this at your normal rate of breathing. If you feel you are not getting enough air, breathe in while slowly counting to 2 or 3. 3. Pucker (purse) your lips as if you were going to whistle. 4. Gently tighten your abdomen muscles or press on your belly to help push the air out. 5. Breathe out slowly through your pursed lips. Take at least twice as long to breathe out as it takes you to breathe in. 6. Make sure that you breathe out all of the air, but do not force air out. 7. Repeat the exercise until your breathing improves. Ask your health care provider how often and how long to do this exercise. Follow these instructions at home:  Take over-the-counter and prescription medicines only as told by your health care  provider.  Return to your normal activities as told by your health care provider. Ask your health care provider what activities are safe for you.  Do not use any products that contain nicotine or tobacco, such as cigarettes and e-cigarettes. If you need help quitting, ask your health care provider.  Keep all follow-up visits as told by your health care provider. This is important. Contact a health care provider if:  Your shortness of breath gets worse.  You become less able to exercise or be active.  You develop a cough.  You develop a fever. Get help right away if:  You are struggling to breathe.  Your shortness of breath prevents you from engaging in any activity. Summary  Pursed lip breathing is a breathing technique that helps to remove trapped air from your lungs. It helps you get more oxygen into your lungs and makes your body have to work less hard to breathe.  Pursed lip breathing can gradually make you more able to be physically active.  You can do pursed lip breathing on your own at home.  Ask your health care provider how often and how long you should do pursed lip breathing. This information is not intended to replace advice given to you by your health care provider. Make sure you discuss any questions you have with your health care provider. Document Released: 05/18/2008 Document Revised: 07/01/2016 Document Reviewed: 07/01/2016 Elsevier Interactive Patient Education  2019 [employer].

## 2018-09-21 NOTE — Telephone Encounter (Signed)
Patient calling the office for samples of medication:   1.  What medication and dosage are you requesting samples for? Eliquis 5 mg po BID   2.  Are you currently out of this medication?  Yes assistance approval pending patient would like to know if she can have 3 bottles if available to make it to next ov

## 2018-09-21 NOTE — Telephone Encounter (Signed)
Patient requesting samples and checking on the status of patient assistance application.

## 2018-09-21 NOTE — Chronic Care Management (AMB) (Signed)
Chronic Care Management   Note  09/21/2018 Name: Amber Reyes MRN: [id number] DOB: 1945/11/20   Subjective:  73 y.o. year old female referred to [facility] pharmacist for medication assistance.   Referral source: Dr. Ancil Boozer Referral medication(s): Eliquis, Advair, Trulicity, Humalog Current insurance: [employer] Currently receiving Extra Help:  '[]'  Yes '[x]'  No '[]'  Unknown   Objective: Medications Reviewed Today    Reviewed by Steele Sizer, MD (Physician) on 08/30/18 at 0750  Med List Status: <None>  Medication Order Taking? Sig Documenting Provider Last Dose Status Informant  albuterol (PROVENTIL HFA;VENTOLIN HFA) 108 (90 BASE) MCG/ACT inhaler [id number] Yes Inhale 2 puffs into the lungs every 6 (six) hours as needed for wheezing or shortness of breath. [provider] Taking Active Self  allopurinol (ZYLOPRIM) 100 MG tablet [id number] Yes Take 1 tablet (100 mg total) by mouth 2 (two) times daily. Steele Sizer, MD Taking Active   apixaban (ELIQUIS) 5 MG TABS tablet [id number] Yes Take 1 tablet (5 mg total) by mouth 2 (two) times daily. Wellington Hampshire, MD Taking Active   Blood Glucose Calibration (TRUE METRIX LEVEL 1) Low SOLN [id number] Yes 1 each by In Vitro route once a week. Steele Sizer, MD Taking Active   Blood Glucose Monitoring Suppl (TRUE METRIX AIR GLUCOSE METER) w/Device KIT [id number] Yes 1 kit by Does not apply route 3 (three) times daily. Steele Sizer, MD Taking Active   Cholecalciferol (VITAMIN D) 2000 UNITS tablet [id number] Yes Take 2,000 Units by mouth daily. [provider] Taking Active Self  colchicine (COLCRYS) 0.6 MG tablet [id number] Yes Take 1-2 tablets (0.6-1.2 mg total) by mouth daily as needed. Steele Sizer, MD Taking Active   diltiazem (CARDIZEM CD) 300 MG 24 hr capsule [id number] Yes Take 1 capsule (300 mg total) by mouth daily. Steele Sizer, MD Taking Active   Dulaglutide (TRULICITY) 1.5 HE/5.2DP SOPN [id number] Yes Inject 1.5 mg  into the skin once a week. Steele Sizer, MD Taking Active   Elastic Bandages & Supports ([place]) [place] [id number] Yes 2 each by Does not apply route daily. Steele Sizer, MD Taking Active Self  fluconazole (DIFLUCAN) 150 MG tablet [id number] Yes TAKE ONE TABLET BY MOUTH EVERY OTHER DAY AS NEEDED FOR  YEAST  INFECTION Steele Sizer, MD Taking Active   fluticasone (FLONASE) 50 MCG/ACT nasal spray [id number] Yes Place 2 sprays into both nostrils daily. Hubbard Hartshorn, FNP Taking Active   Fluticasone-Salmeterol (ADVAIR DISKUS) 250-50 MCG/DOSE AEPB [id number] Yes Inhale 1 puff into the lungs 2 (two) times daily. Steele Sizer, MD Taking Active   furosemide (LASIX) 40 MG tablet [id number] Yes Take 1 tablet (40 mg total) by mouth 2 (two) times daily as needed. Steele Sizer, MD Taking Active   Insulin Lispro Prot & Lispro (HUMALOG MIX 75/25 KWIKPEN) (75-25) 100 UNIT/ML Claiborne Rigg [id number] Yes Inject 38-60 Units into the skin 2 (two) times daily. Steele Sizer, MD Taking Active   Insulin Pen Needle (NOVOFINE) 30G X 8 MM MISC [id number] Yes Inject 10 each into the skin daily. Steele Sizer, MD Taking Active   irbesartan (AVAPRO) 300 MG tablet [id number] Yes Take 1 tablet (300 mg total) by mouth daily. Steele Sizer, MD Taking Active   pravastatin (PRAVACHOL) 80 MG tablet [id number] Yes Take 1 tablet (80 mg total) by mouth every evening. Steele Sizer, MD Taking Active   TRUE METRIX BLOOD GLUCOSE TEST test strip [id number] Yes USE  AS  INSTRUCTED Steele Sizer, MD Taking Active   TRUEPLUS LANCETS 33G [place] [id number] Yes  1 each by Does not apply route 3 (three) times daily. Steele Sizer, MD Taking Active          Assessment:  Medication Review Findings:  . None of clinical significance  Medication Assistance Findings:  Extra Help:   '[]'  Already receiving  '[]'  Eligible based on reported income  '[x]'  Not Eligible based on reported income  Patient Assistance  Programs: 1) Trulicity, Humalog made by [employer] requirement met: '[x]'  Yes '[]'  No o Troop requirement met: '[x]'  Yes '[]'  No  - Need financial statement        2)  Eliquis made by [place] requirement met: '[x]'  Yes '[]'  No o Troop requirement met: '[]'  Yes '[x]'  No  - Handled by Dr. Reino Bellis office - BMS will accept household TROOP        3)  Adviar made by [place] o Income requirement met: '[x]'  Yes '[]'  No o Troop requirement met: '[]'  Yes '[x]'  No  - Patient declines switching to [facility] at this time (made by [employer], no OOP requirement)    Additional medication assistance options reviewed with patient as warranted including:  Marland Kitchen Mail order programs . Insurance OTC catalogues (provided 2020 catalog)  Goals Addressed            This Visit's Progress   . "I don't understand my lung disease" (pt-stated)       Current Barriers:   Knowledge deficit related to obstructive sleep apnea and chronic bronchitis  Nurse Case Manager Clinical Goal(s): Over the next 14 days, patient will verbalize better understanding of lung disease   Interventions:  . Provided counseling and education regarding the differences between sleep apnea and chronic bronchitis . Educated patient on disease mechanisms of both OSA and chronic lung disease . Demonstrated pursed lip breathing . Provided patient with COPD (chronic bronchitis) action plan and review importance of MD notification when in yellow zone . Discussed importance of using maintenance inhaler . Discussed weight loss as a benefit to improve OSA and shortness of breath  Patient Self Care Activities:  . Practice pursed lip breathing . Take medications as prescribed and notify MD of medication is not affordable (please consider switching to [facility] if MD approves)   *initial goal documentation     . "I really want to loose this weight" (pt-stated)       Current Barriers:  Marland Kitchen Knowledge deficit related to how to loose weight   Nurse Case  Manager Clinical Goal(s): Over the next 14 days, patient will consider referral to nutritionist/weight loss clinic  Interventions:  . Provided emotional support and reassurance . Discussed how excess weight contributes to chronic diseases . Reviewed options for weight loss including referral to [facility] nutrition  Patient Self Care Activities:  . Patient will request referral to [facility] nutritionist   *initial goal documentation     . "Some of my medications are really expensive and I can't afford" (pt-stated)       Current Barriers:  . financial  Pharmacist Clinical Goal(s): Over the next 14 days, [name] will provide the necessary supplementary documents (proof of out of pocket prescription expenditure, proof of household income) needed for medication assistance applications to CCM pharmacist.   Interventions: . CCM pharmacist will apply for medication assistance program for Trulicity and Humalog made by [employer]. . Discuss different options for "maintenance" inhaler due to Advair cost.   . Dr. Reino Bellis office is pursuing medication assistance for Eliquis.  Patient Self Care Activities:  .  Gather necessary documents needed to apply for medication assistance  *initial goal documentation        Plan: Follow up in 2 weeks for financial statements; Follow up with Dr. Reino Bellis office regarding BMS program  Ruben Reason, PharmD Clinical Pharmacist [place] [phone]

## 2018-09-22 ENCOUNTER — Other Ambulatory Visit: Payer: Self-pay | Admitting: Family Medicine

## 2018-09-22 NOTE — Telephone Encounter (Signed)
Refill request for general medication: Diflucan 150 mg  Last office visit: 08/30/2018  Last physical exam: None indicated   Follow-ups on file. 10/05/2018

## 2018-09-25 ENCOUNTER — Ambulatory Visit: Payer: Medicare PPO | Admitting: Certified Registered Nurse Anesthetist

## 2018-09-25 ENCOUNTER — Encounter
Admission: RE | Disposition: A | Discharge status: Home / Self Care | Payer: Self-pay | Source: Home / Self Care | Attending: Gastroenterology

## 2018-09-25 ENCOUNTER — Ambulatory Visit
Admission: RE | Admit: 2018-09-25 | Discharge: 2018-09-25 | Disposition: A | Discharge status: Home / Self Care | Payer: Medicare PPO | Attending: Gastroenterology | Admitting: Gastroenterology

## 2018-09-25 DIAGNOSIS — E669 Obesity, unspecified: Secondary | ICD-10-CM | POA: Diagnosis not present

## 2018-09-25 DIAGNOSIS — G4730 Sleep apnea, unspecified: Secondary | ICD-10-CM | POA: Diagnosis not present

## 2018-09-25 DIAGNOSIS — E1122 Type 2 diabetes mellitus with diabetic chronic kidney disease: Secondary | ICD-10-CM | POA: Insufficient documentation

## 2018-09-25 DIAGNOSIS — I130 Hypertensive heart and chronic kidney disease with heart failure and stage 1 through stage 4 chronic kidney disease, or unspecified chronic kidney disease: Secondary | ICD-10-CM | POA: Insufficient documentation

## 2018-09-25 DIAGNOSIS — E785 Hyperlipidemia, unspecified: Secondary | ICD-10-CM | POA: Diagnosis not present

## 2018-09-25 DIAGNOSIS — Z7901 Long term (current) use of anticoagulants: Secondary | ICD-10-CM | POA: Diagnosis not present

## 2018-09-25 DIAGNOSIS — G2581 Restless legs syndrome: Secondary | ICD-10-CM | POA: Insufficient documentation

## 2018-09-25 DIAGNOSIS — D122 Benign neoplasm of ascending colon: Secondary | ICD-10-CM | POA: Insufficient documentation

## 2018-09-25 DIAGNOSIS — F419 Anxiety disorder, unspecified: Secondary | ICD-10-CM | POA: Insufficient documentation

## 2018-09-25 DIAGNOSIS — Z79899 Other long term (current) drug therapy: Secondary | ICD-10-CM | POA: Insufficient documentation

## 2018-09-25 DIAGNOSIS — I872 Venous insufficiency (chronic) (peripheral): Secondary | ICD-10-CM | POA: Insufficient documentation

## 2018-09-25 DIAGNOSIS — R011 Cardiac murmur, unspecified: Secondary | ICD-10-CM | POA: Insufficient documentation

## 2018-09-25 DIAGNOSIS — I5032 Chronic diastolic (congestive) heart failure: Secondary | ICD-10-CM | POA: Insufficient documentation

## 2018-09-25 DIAGNOSIS — R195 Other fecal abnormalities: Secondary | ICD-10-CM | POA: Diagnosis not present

## 2018-09-25 DIAGNOSIS — Z6841 Body Mass Index (BMI) 40.0 and over, adult: Secondary | ICD-10-CM | POA: Insufficient documentation

## 2018-09-25 DIAGNOSIS — I491 Atrial premature depolarization: Secondary | ICD-10-CM | POA: Diagnosis not present

## 2018-09-25 DIAGNOSIS — J309 Allergic rhinitis, unspecified: Secondary | ICD-10-CM | POA: Diagnosis not present

## 2018-09-25 DIAGNOSIS — M109 Gout, unspecified: Secondary | ICD-10-CM | POA: Diagnosis not present

## 2018-09-25 DIAGNOSIS — N183 Chronic kidney disease, stage 3 (moderate): Secondary | ICD-10-CM | POA: Diagnosis not present

## 2018-09-25 DIAGNOSIS — D126 Benign neoplasm of colon, unspecified: Secondary | ICD-10-CM | POA: Diagnosis not present

## 2018-09-25 DIAGNOSIS — E559 Vitamin D deficiency, unspecified: Secondary | ICD-10-CM | POA: Diagnosis not present

## 2018-09-25 DIAGNOSIS — Z794 Long term (current) use of insulin: Secondary | ICD-10-CM | POA: Insufficient documentation

## 2018-09-25 DIAGNOSIS — D631 Anemia in chronic kidney disease: Secondary | ICD-10-CM | POA: Diagnosis not present

## 2018-09-25 DIAGNOSIS — Z7951 Long term (current) use of inhaled steroids: Secondary | ICD-10-CM | POA: Insufficient documentation

## 2018-09-25 DIAGNOSIS — K635 Polyp of colon: Secondary | ICD-10-CM | POA: Diagnosis not present

## 2018-09-25 HISTORY — PX: COLONOSCOPY WITH PROPOFOL: SHX5780

## 2018-09-25 LAB — GLUCOSE, CAPILLARY: Glucose-Capillary: 120 mg/dL — ABNORMAL HIGH (ref 70–99)

## 2018-09-25 SURGERY — COLONOSCOPY WITH PROPOFOL
Anesthesia: General

## 2018-09-25 MED ORDER — LIDOCAINE HCL (PF) 2 % IJ SOLN
INTRAMUSCULAR | Status: AC
  Filled 2018-09-25: qty 10

## 2018-09-25 MED ORDER — SODIUM CHLORIDE 0.9 % IV SOLN
INTRAVENOUS | Status: DC
  Administered 2018-09-25: 1000 mL via INTRAVENOUS

## 2018-09-25 MED ORDER — PROPOFOL 500 MG/50ML IV EMUL
INTRAVENOUS | Status: DC | PRN
  Administered 2018-09-25: 120 ug/kg/min via INTRAVENOUS

## 2018-09-25 MED ORDER — PROPOFOL 500 MG/50ML IV EMUL
INTRAVENOUS | Status: AC
  Filled 2018-09-25: qty 50

## 2018-09-25 MED ORDER — PROPOFOL 10 MG/ML IV BOLUS
INTRAVENOUS | Status: DC | PRN
  Administered 2018-09-25: 80 mg via INTRAVENOUS

## 2018-09-25 MED ORDER — LIDOCAINE HCL (CARDIAC) PF 100 MG/5ML IV SOSY
PREFILLED_SYRINGE | INTRAVENOUS | Status: DC | PRN
  Administered 2018-09-25: 50 mg via INTRAVENOUS
  Administered 2018-09-25: 110 mg via INTRAVENOUS

## 2018-09-25 NOTE — Anesthesia Postprocedure Evaluation (Signed)
Anesthesia Post Note  Patient: EULETA BELSON  Procedure(s) Performed: COLONOSCOPY WITH PROPOFOL (N/A )  Patient location during evaluation: PACU Anesthesia Type: General Level of consciousness: awake and alert Pain management: pain level controlled Vital Signs Assessment: post-procedure vital signs reviewed and stable Respiratory status: spontaneous breathing, nonlabored ventilation, respiratory function stable and patient connected to nasal cannula oxygen Cardiovascular status: blood pressure returned to baseline and stable Postop Assessment: no apparent nausea or vomiting Anesthetic complications: no     Last Vitals:  Vitals:   09/25/18 0950 09/25/18 1000  BP: 133/61 123/60  Pulse: 71 72  Resp: 20 14  Temp:    SpO2: 100% 100%    Last Pain:  Vitals:   09/25/18 0920  TempSrc: Tympanic  PainSc:                  Molli Barrows

## 2018-09-25 NOTE — Transfer of Care (Signed)
Immediate Anesthesia Transfer of Care Note  Patient: Amber Reyes  Procedure(s) Performed: COLONOSCOPY WITH PROPOFOL (N/A )  Patient Location: PACU and Endoscopy Unit  Anesthesia Type:General  Level of Consciousness: awake and patient cooperative  Airway & Oxygen Therapy: Patient Spontanous Breathing and Patient connected to nasal cannula oxygen  Post-op Assessment: Report given to RN and Post -op Vital signs reviewed and stable  Post vital signs: Reviewed and stable  Last Vitals:  Vitals Value Taken Time  BP 126/97 09/25/2018  9:20 AM  Temp 36.1 C 09/25/2018  9:20 AM  Pulse 78 09/25/2018  9:20 AM  Resp 18 09/25/2018  9:20 AM  SpO2 100 % 09/25/2018  9:20 AM    Last Pain:  Vitals:   09/25/18 0920  TempSrc: Tympanic  PainSc:          Complications: No apparent anesthesia complications

## 2018-09-25 NOTE — Op Note (Signed)
[facility] Gastroenterology Patient Name: Amber Reyes Procedure Date: 09/25/2018 7:36 AM MRN: [id number] Account #: [id number] Date of Birth: Dec 24, 1945 Admit Type: Outpatient Age: 73 Room: [facility] ENDO ROOM 4 Gender: Female Note Status: Finalized Procedure:            Colonoscopy Indications:          Positive Cologuard test Providers:            Jonathon Bellows MD, MD Medicines:            Monitored Anesthesia Care Complications:        No immediate complications. Procedure:            Pre-Anesthesia Assessment:                       - Prior to the procedure, a History and Physical was                        performed, and patient medications, allergies and                        sensitivities were reviewed. The patient's tolerance of                        previous anesthesia was reviewed.                       - The risks and benefits of the procedure and the                        sedation options and risks were discussed with the                        patient. All questions were answered and informed                        consent was obtained.                       - ASA Grade Assessment: III - A patient with severe                        systemic disease.                       After obtaining informed consent, the colonoscope was                        passed under direct vision. Throughout the procedure,                        the patient's blood pressure, pulse, and oxygen                        saturations were monitored continuously. The                        Colonoscope was introduced through the anus and                        advanced to the the cecum, identified by the  appendiceal orifice, IC valve and transillumination.                        The colonoscopy was technically difficult and complex                        due to inadequate bowel prep. The patient tolerated the                        procedure fairly well. The  quality of the bowel                        preparation was good. Findings:      The perianal and digital rectal examinations were normal.      Eight sessile polyps were found in the ascending colon. The polyps were       6 to 10 mm in size. These polyps were removed with a cold snare.       Resection and retrieval were complete.      A 15 mm polyp was found in the proximal ascending colon. The polyp was       flat. Preparations were made for mucosal resection. 10 mL of Eleview was       injected with adequate lift of the lesion from the muscularis propria.       Snare mucosal resection with suction (via the working channel) retrieval       was performed. A 17 mm area was resected. Four pieces were resected in       total. Resection was incomplete. The resected tissue was retrieved.       There was no bleeding during and at the end of the procedure. To prevent       bleeding after mucosal resection, two hemostatic clips were successfully       placed. A part of the edge of the polyp was not resected as it was not       able to get a snare over it, the patient was breathing with a lot of       movement making it hard. Clips were placed at the other end of the polyp       ensuring that the unresected area was not included      The exam was otherwise without abnormality. Impression:           - Eight 6 to 10 mm polyps in the ascending colon,                        removed with a cold snare. Resected and retrieved.                       - One 15 mm polyp in the proximal ascending colon,                        removed with mucosal resection. Incomplete resection.                        Resected tissue retrieved. Clips were placed.                       - The examination was otherwise normal.                       -  Mucosal resection was performed. Resection was                        incomplete. The resected tissue was retrieved. Recommendation:       - Discharge patient to home (with  escort).                       - Resume previous diet.                       - Continue present medications.                       - Await pathology results.                       - Repeat colonoscopy in 6 months for surveillance after                        piecemeal polypectomy. Procedure Code(s):    --- Professional ---                       [phone], 59, Colonoscopy, flexible; with endoscopic                        mucosal resection                       [phone], Colonoscopy, flexible; with removal of tumor(s),                        polyp(s), or other lesion(s) by snare technique Diagnosis Code(s):    --- Professional ---                       D12.2, Benign neoplasm of ascending colon                       R19.5, Other fecal abnormalities CPT copyright 2018 American Medical Association. All rights reserved. The codes documented in this report are preliminary and upon coder review may  be revised to meet current compliance requirements. Jonathon Bellows, MD Jonathon Bellows MD, MD 09/25/2018 9:27:36 AM This report has been signed electronically. Number of Addenda: 0 Note Initiated On: 09/25/2018 7:36 AM Scope Withdrawal Time: 0 hours 49 minutes 59 seconds  Total Procedure Duration: 0 hours 52 minutes 42 seconds       [facility]

## 2018-09-25 NOTE — Anesthesia Post-op Follow-up Note (Signed)
Anesthesia QCDR form completed.        

## 2018-09-25 NOTE — Anesthesia Preprocedure Evaluation (Signed)
Anesthesia Evaluation  Patient identified by MRN, date of birth, ID band Patient awake    Reviewed: Allergy & Precautions, H&P , NPO status , Patient's Chart, lab work & pertinent test results, reviewed documented beta blocker date and time   Airway Mallampati: III   Neck ROM: full    Dental  (+) Poor Dentition, Teeth Intact   Pulmonary neg pulmonary ROS, asthma , sleep apnea and Continuous Positive Airway Pressure Ventilation ,    Pulmonary exam normal        Cardiovascular Exercise Tolerance: Poor hypertension, On Medications +CHF  negative cardio ROS Normal cardiovascular exam+ Valvular Problems/Murmurs  Rhythm:regular Rate:Normal     Neuro/Psych Anxiety negative neurological ROS  negative psych ROS   GI/Hepatic negative GI ROS, Neg liver ROS,   Endo/Other  negative endocrine ROSdiabetesMorbid obesity  Renal/GU Renal diseasenegative Renal ROS  negative genitourinary   Musculoskeletal   Abdominal   Peds  Hematology negative hematology ROS (+) Blood dyscrasia, anemia ,   Anesthesia Other Findings Past Medical History: No date: Allergic rhinitis, cause unspecified No date: Anxiety No date: Chronic diastolic CHF (congestive heart failure) (HCC) No date: CKD (chronic kidney disease), stage III (HCC) No date: Edema No date: Essential hypertension, benign No date: Gout, unspecified No date: Heart murmur     Comment:  as child No date: Hyperlipidemia No date: Lipoma of unspecified site No date: Other and unspecified disc disorder of lumbar region No date: Other general symptoms(780.99) No date: PAC (premature atrial contraction) No date: Renal insufficiency No date: Restless legs syndrome (RLS) No date: Sebaceous cyst 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 No date: 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 complication, uncontrolled No date: Unspecified asthma(493.90) No date: Unspecified disease of  pericardium No date: Unspecified sleep apnea No date: Unspecified vitamin D deficiency No date: Vaginitis and vulvovaginitis, unspecified No date: Venous insufficiency Past Surgical History: 2002: CARDIAC CATHETERIZATION     Comment:  [facility] No date: PERICARDIUM SURGERY BMI    Body Mass Index:  56.68 kg/m     Reproductive/Obstetrics negative OB ROS                             Anesthesia Physical Anesthesia Plan  ASA: IV  Anesthesia Plan: General   Post-op Pain Management:    Induction:   PONV Risk Score and Plan:   Airway Management Planned:   Additional Equipment:   Intra-op Plan:   Post-operative Plan:   Informed Consent: I have reviewed the patients History and Physical, chart, labs and discussed the procedure including the risks, benefits and alternatives for the proposed anesthesia with the patient or authorized representative who has indicated his/her understanding and acceptance.     Dental Advisory Given  Plan Discussed with: CRNA  Anesthesia Plan Comments:         Anesthesia Quick Evaluation

## 2018-09-25 NOTE — H&P (Signed)
Jonathon Bellows, MD [address] [place], [place], [place], [address] [address], [place], [place], [place], [address] Phone: [phone]  Fax: [phone]  Primary Care Physician:  Steele Sizer, MD   Pre-Procedure History & Physical: HPI:  ANAMAE ROCHELLE is a 73 y.o. female is here for an colonoscopy.   Past Medical History:  Diagnosis Date  . Allergic rhinitis, cause unspecified   . Anxiety   . Chronic diastolic CHF (congestive heart failure) ([place])   . CKD (chronic kidney disease), stage III ([place])   . Edema   . Essential hypertension, benign   . Gout, unspecified   . Heart murmur    as child  . Hyperlipidemia   . Lipoma of unspecified site   . Other and unspecified disc disorder of lumbar region   . Other general symptoms(780.99)   . PAC (premature atrial contraction)   . Renal insufficiency   . Restless legs syndrome (RLS)   . Sebaceous cyst   . Super obesity   . Type II or unspecified type diabetes mellitus without mention of complication, uncontrolled   . Unspecified asthma(493.90)   . Unspecified disease of pericardium   . Unspecified sleep apnea   . Unspecified vitamin D deficiency   . Vaginitis and vulvovaginitis, unspecified   . Venous insufficiency     Past Surgical History:  Procedure Laterality Date  . CARDIAC CATHETERIZATION  2002   [facility]  . PERICARDIUM SURGERY      Prior to Admission medications   Medication Sig Start Date End Date Taking? Authorizing Provider  albuterol (PROVENTIL HFA;VENTOLIN HFA) 108 (90 BASE) MCG/ACT inhaler Inhale 2 puffs into the lungs every 6 (six) hours as needed for wheezing or shortness of breath.   Yes [provider]  allopurinol (ZYLOPRIM) 100 MG tablet Take 1 tablet (100 mg total) by mouth 2 (two) times daily. 08/30/18  Yes Sowles, Drue Stager, MD  apixaban (ELIQUIS) 5 MG TABS tablet Take 1 tablet (5 mg total) by mouth 2 (two) times daily. 08/22/18  Yes Wellington Hampshire, MD  Blood Glucose  Calibration (TRUE METRIX LEVEL 1) Low SOLN 1 each by In Vitro route once a week. 12/12/17  Yes Sowles, Drue Stager, MD  Blood Glucose Monitoring Suppl (TRUE METRIX AIR GLUCOSE METER) w/Device KIT 1 kit by Does not apply route 3 (three) times daily. 12/12/17  Yes Sowles, Drue Stager, MD  Cholecalciferol (VITAMIN D) 2000 UNITS tablet Take 2,000 Units by mouth daily.   Yes [provider]  colchicine (COLCRYS) 0.6 MG tablet Take 1-2 tablets (0.6-1.2 mg total) by mouth daily as needed. 11/25/17  Yes Sowles, Drue Stager, MD  diltiazem (CARDIZEM CD) 300 MG 24 hr capsule Take 1 capsule (300 mg total) by mouth daily. 08/30/18  Yes Sowles, Drue Stager, MD  Dulaglutide (TRULICITY) 1.5 FB/9.0XY SOPN Inject 1.5 mg into the skin once a week. 08/30/18  Yes Steele Sizer, MD  Elastic Bandages & Supports (MEDICAL COMPRESSION STOCKINGS) [place] 2 each by Does not apply route daily. 04/20/16  Yes Sowles, Drue Stager, MD  fluconazole (DIFLUCAN) 150 MG tablet TAKE ONE TABLET EVERY OTHER DAY AS NEEDED FOR  YEAST  INFECTION 09/22/18  Yes Sowles, Drue Stager, MD  fluticasone (FLONASE) 50 MCG/ACT nasal spray Place 2 sprays into both nostrils daily. 03/23/18  Yes Hubbard Hartshorn, FNP  Fluticasone-Salmeterol (ADVAIR DISKUS) 250-50 MCG/DOSE AEPB Inhale 1 puff into the lungs 2 (two) times daily. 08/30/18  Yes Sowles, Drue Stager, MD  furosemide (LASIX) 40 MG tablet Take 1 tablet (40  mg total) by mouth 2 (two) times daily as needed. 08/30/18  Yes Sowles, Drue Stager, MD  Insulin Lispro Prot & Lispro (HUMALOG MIX 75/25 KWIKPEN) (75-25) 100 UNIT/ML Kwikpen Inject 25-55 Units into the skin 2 (two) times daily. 08/30/18  Yes Sowles, Drue Stager, MD  Insulin Pen Needle (NOVOFINE) 30G X 8 MM MISC Inject 10 each into the skin daily. 11/25/17  Yes Sowles, Drue Stager, MD  irbesartan (AVAPRO) 300 MG tablet Take 1 tablet (300 mg total) by mouth daily. 08/30/18  Yes Sowles, Drue Stager, MD  pravastatin (PRAVACHOL) 80 MG tablet Take 1 tablet (80 mg total) by mouth every evening. 08/30/18  Yes Sowles,  Drue Stager, MD  TRUE METRIX BLOOD GLUCOSE TEST test strip USE  AS  INSTRUCTED 05/18/18  Yes Sowles, Drue Stager, MD  TRUEPLUS LANCETS 33G MISC 1 each by Does not apply route 3 (three) times daily. 12/12/17   Steele Sizer, MD    Allergies as of 09/14/2018  . (No Known Allergies)    Family History  Problem Relation Age of Onset  . Heart attack Mother   . Hypertension Mother     Social History   Socioeconomic History  . Marital status: Married    Spouse name: Not on file  . Number of children: 3  . Years of education: Not on file  . Highest education level: Associate degree: academic program  Occupational History  . Occupation: retired  Scientific laboratory technician  . Financial resource strain: Not hard at all  . Food insecurity:    Worry: Never true    Inability: Never true  . Transportation needs:    Medical: No    Non-medical: No  Tobacco Use  . Smoking status: Never Smoker  . Smokeless tobacco: Never Used  . Tobacco comment: n/a  Substance and Sexual Activity  . Alcohol use: No    Alcohol/week: 0.0 standard drinks  . Drug use: No  . Sexual activity: Not Currently  Lifestyle  . Physical activity:    Days per week: 0 days    Minutes per session: Not on file  . Stress: Only a little  Relationships  . Social connections:    Talks on phone: More than three times a week    Gets together: Once a week    Attends religious service: More than 4 times per year    Active member of club or organization: Yes    Attends meetings of clubs or organizations: More than 4 times per year    Relationship status: Married  . Intimate partner violence:    Fear of current or ex partner: No    Emotionally abused: No    Physically abused: No    Forced sexual activity: No  Other Topics Concern  . Not on file  Social History Narrative  . Not on file    Review of Systems: See HPI, otherwise negative ROS  Physical Exam: BP (!) 174/74   Pulse 72   Temp (!) 96.7 F (35.9 C) (Tympanic)   Resp 20    Ht _0  (1.778 m)   Wt (!) 179.2 kg   SpO2 100%   BMI 56.68 kg/m  General:   Alert,  pleasant and cooperative in NAD Head:  Normocephalic and atraumatic. Neck:  Supple; no masses or thyromegaly. Lungs:  Clear throughout to auscultation, normal respiratory effort.    Heart:  +S1, +S2, Regular rate and rhythm, No edema. Abdomen:  Soft, nontender and nondistended. Normal bowel sounds, without guarding, and without rebound.   Neurologic:  Alert and  oriented x4;  grossly normal neurologically.  Impression/Plan: [name] is here for an colonoscopy to be performed for positive cologuard  Risks, benefits, limitations, and alternatives regarding  colonoscopy have been reviewed with the patient.  Questions have been answered.  All parties agreeable.   Jonathon Bellows, MD  09/25/2018, 8:12 AM

## 2018-09-26 LAB — SURGICAL PATHOLOGY

## 2018-09-27 ENCOUNTER — Encounter: Payer: Self-pay | Admitting: Gastroenterology

## 2018-09-28 ENCOUNTER — Other Ambulatory Visit: Payer: Self-pay | Admitting: Family Medicine

## 2018-09-28 DIAGNOSIS — Z1231 Encounter for screening mammogram for malignant neoplasm of breast: Secondary | ICD-10-CM

## 2018-09-28 NOTE — Telephone Encounter (Signed)
Call placed to [facility]. They stated that they needed the patient's Medicare number and number of people in the patient's household.   The patient has been called and made aware.

## 2018-10-03 ENCOUNTER — Telehealth: Payer: Self-pay

## 2018-10-03 NOTE — Telephone Encounter (Signed)
Spoke with pt and informed her of biopsy results and Dr. Georgeann Oppenheim instructions for pt to have a repeat colonoscopy in 6 months.

## 2018-10-03 NOTE — Telephone Encounter (Signed)
-----   Message from Jonathon Bellows, MD sent at 09/27/2018  9:03 AM EST ----- Sherald Hess inform the polyps were pre cancerous- since multiple and 1 large one taken out in pieces - guidelines are to repeat in 6 months- please schedule  C/c Steele Sizer, MD    Dr Jonathon Bellows MD,MRCP [facility]) Gastroenterology/Hepatology Pager: [phone]

## 2018-10-04 ENCOUNTER — Telehealth: Payer: Self-pay

## 2018-10-04 ENCOUNTER — Other Ambulatory Visit: Payer: Self-pay

## 2018-10-04 ENCOUNTER — Other Ambulatory Visit: Payer: Self-pay | Admitting: Family Medicine

## 2018-10-04 DIAGNOSIS — Z86010 Personal history of colonic polyps: Secondary | ICD-10-CM

## 2018-10-04 MED ORDER — NA SULFATE-K SULFATE-MG SULF 17.5-3.13-1.6 GM/177ML PO SOLN: 1.0000 | Freq: Once | ORAL | 0 refills | Status: AC

## 2018-10-04 NOTE — Telephone Encounter (Signed)
Patient discussed results with Jadijah yesterday, however she had some additional concerns.  Her concerns were that she thought based on what her daughter said she was "ate up with cancer" and repeat colonoscopy was to remove the cancer.  I explained to her that she had multiple polyps that were "pre-cancerous" this did not mean she had cancerous polyps, but these polyps if left in her colon had the potential to turn into cancer if not removed and this is why having a colonoscopy is so important to check to see if there are polyps present and remove them before they can turn into cancer.    In regards to repeating the colonoscopy-I advised her that this is protocol due to the large number of polyps removed we want to ensure that all of the polyps were successfully removed as one had to be taken out in pieces.  That is why we do a repeat in 6 months.  Patient expressed understanding, and was very pleased with the bedside mannerisms of Dr. Vicente Males and the service of Sherald Hess and myself in addressing her concerns.  She has been scheduled for her repeat colonoscopy for 04/09/19 with Dr. Vicente Males.  Thanks [employer]

## 2018-10-04 NOTE — Telephone Encounter (Signed)
Contacted patient to see if she was okay with rescheduling her 04/09/19 colonoscopy.  I did not realize Dr. Vicente Males was on PAL.  She has been rescheduled to 04/13/19.  Trish in Endo notified.  Thanks [employer]

## 2018-10-05 ENCOUNTER — Ambulatory Visit: Payer: Self-pay | Admitting: Pharmacist

## 2018-10-05 ENCOUNTER — Ambulatory Visit (INDEPENDENT_AMBULATORY_CARE_PROVIDER_SITE_OTHER): Payer: Medicare PPO

## 2018-10-05 DIAGNOSIS — Z794 Long term (current) use of insulin: Secondary | ICD-10-CM | POA: Diagnosis not present

## 2018-10-05 DIAGNOSIS — E1122 Type 2 diabetes mellitus with diabetic chronic kidney disease: Secondary | ICD-10-CM | POA: Diagnosis not present

## 2018-10-05 DIAGNOSIS — N183 Chronic kidney disease, stage 3 unspecified: Secondary | ICD-10-CM

## 2018-10-05 DIAGNOSIS — I110 Hypertensive heart disease with heart failure: Secondary | ICD-10-CM | POA: Diagnosis not present

## 2018-10-05 DIAGNOSIS — Z598 Other problems related to housing and economic circumstances: Secondary | ICD-10-CM

## 2018-10-05 DIAGNOSIS — Z599 Problem related to housing and economic circumstances, unspecified: Secondary | ICD-10-CM

## 2018-10-05 DIAGNOSIS — J42 Unspecified chronic bronchitis: Secondary | ICD-10-CM

## 2018-10-05 DIAGNOSIS — E1129 Type 2 diabetes mellitus with other diabetic kidney complication: Secondary | ICD-10-CM

## 2018-10-05 DIAGNOSIS — R809 Proteinuria, unspecified: Secondary | ICD-10-CM

## 2018-10-05 NOTE — Chronic Care Management (AMB) (Signed)
  Chronic Care Management   Follow Up Note   10/05/2018 Name: Amber Reyes MRN: [id number] DOB: Dec 23, 1945  Referred by: Steele Sizer, MD Reason for referral : Chronic Care Management (follow up COPD)   Subjective: "I have picked up a chest cold from my husband"  Objective:  Assessment: Amber Reyes a 73year old female who sees Dr. Laure Kidney primary care.Dr. Lenox Ahr the CCM team to consult the patient for assistance with chronic disease management related toHTN, Pulmonary HTN, DM, Hyperlipidemia, and CKD. Patient also needs assistance with medication afordibility. Referral was placed1/03/2019. Amber Reyes agreed to [facility] and met with the CCM Team 09/21/2018 to establishes health goals. Today the CCM Team follow up with Amber Reyes to discuss progression towards goals.  Goals Addressed            This Visit's Progress   . "I don't understand my lung disease" (pt-stated)       Amber Reyes states she has recently picked up a "chest cold" from her husband. She feels symptoms peaked yesterday and today she is improving. She is taking a cough Supressant and utilizing pursed lip breathing. She has not used her rescue inhaler. Today we discussed COPD action plan and Amber Reyes was encouraged to utilize rescue inhaler for airway clearance over the next couple of days. She verbalizes understanding of COPD action plan and has agreed to seek urgent care Saturday if symptoms have not improved or worsens. She continues to use her maintenance inhalers.  Current Barriers:   Knowledge deficit related to obstructive sleep apnea and chronic bronchitis  Nurse Case Manager Clinical Goal(s):   Over the next 14 days, patient will verbalize better understanding of lung disease- in progress   Interventions:  . Reinforced importance in following COPD/Chronic Bronchitis action plan including utilizing rescue inhaler as needed and contacting MD by Saturday if she is not  improving or worsening symptoms . Reinforced utilizing pursed lip breathing to relieve exertional shortness of breath  . Reinforced taking medications as prescribed including cough medications  Patient Self Care Activities:  . Practice pursed lip breathing . Take medications as prescribed and notify MD of medication is not affordable (please consider switching to [facility] if MD approves) . Follows COPD action plan  Please see past updates in goals section for documentation of goal progression      Telephone follow up appointment with CCM team member scheduled for: 1 week     Amber Reyes E. Rollene Rotunda, RN, BSN Nurse Care Coordinator [facility] Care Management  [phone]

## 2018-10-05 NOTE — Chronic Care Management (AMB) (Signed)
  Chronic Care Management   Follow Up Note   10/05/2018 Name: Amber Reyes MRN: [id number] DOB: 1946-06-26  Referred by: Steele Sizer, MD Reason for referral : Chronic Care Management (2 week follow up)   Subjective: "I have a cold today"  Amber Reyes is a 73 year old female patient of Dr. Ancil Boozer for primary care. She has previously engaged with CCM for weight loss, restrictive airway disease, and medication assistance (Trulicity, Humalog)  Amber Reyes has a respiratory infection today that she reports started earlier this week. She is taking "Tussin" for the cough  Assessment:  #Medication assistance: Amber Reyes will drop off Social Security statements for herself and her husband by next week so that [employer] (Trulicity, Humalog) can be submitted  #Cold: CCM RNCM provided education on sick day plan and to use rescue inhaler to aid breathing. Recommended patient call practice for a sick day appointment if cold has not gotten better by Monday.   Goals Addressed            This Visit's Progress   . "Some of my medications are really expensive and I can't afford" (pt-stated)       Current Barriers:  . financial  Pharmacist Clinical Goal(s): Over the next 14 days, Amber Reyes will provide the necessary supplementary documents (proof of out of pocket prescription expenditure, proof of household income) needed for medication assistance applications to CCM pharmacist.  10/05/18: Patient states she will bring documents to [place] office within the next week.   Interventions: . CCM pharmacist will apply for medication assistance program for Trulicity and Humalog made by [employer]. . Discuss different options for "maintenance" inhaler due to Advair cost.   . Dr. Reino Bellis office is pursuing medication assistance for Eliquis.  Patient Self Care Activities:  Marland Kitchen Gather necessary documents needed to apply for medication assistance  Please see past updates related to  this goal by clicking on the "Past Updates" button in the selected goal           The patient has been provided with contact information for the chronic care management team and has been advised to call with any health related questions or concerns.  and The patient will call [facility]* as advised to for a sick day appointment if cold/cough does not improve.    Ruben Reason, PharmD Clinical Pharmacist [facility] [phone]

## 2018-10-05 NOTE — Patient Instructions (Signed)
  Thank you allowing the Chronic Care Management Team to be a part of your care! It was a pleasure speaking with you today!  1. Continue to take all medications as prescribed including your rescue inhaler. It may help with cough symptoms and shortness of breath 2. Follow your COPD action plan and seek medical care if you are worse or not improved in 2 days 3. Continue to use pursed lip breathing   CCM (Chronic Care Management) Team   Trish Fountain RN, BSN Nurse Care Coordinator  [phone]  Ruben Reason PharmD  Clinical Pharmacist  [phone]   Goals Addressed            This Visit's Progress   . "I don't understand my lung disease" (pt-stated)       Current Barriers:   Knowledge deficit related to obstructive sleep apnea and chronic bronchitis  Nurse Case Manager Clinical Goal(s):   Over the next 14 days, patient will verbalize better understanding of lung disease- in progress   Interventions:  . Reinforced importance in following COPD/Chronic Bronchitis action plan including utilizing rescue inhaler as needed and contacting MD by Saturday if she is not improving or worsening symptoms . Reinforced utilizing pursed lip breathing to relieve exertional shortness of breath  . Reinforced taking medications as prescribed including cough medications  Patient Self Care Activities:  . Practice pursed lip breathing . Take medications as prescribed and notify MD of medication is not affordable (please consider switching to [facility] if MD approves) . Follows COPD action plan  Please see past updates in goals section for documentation of goal progression         The patient verbalized understanding of instructions provided today and declined a print copy of patient instruction materials.   Telephone follow up appointment with CCM team member scheduled for: 1 week

## 2018-10-09 NOTE — Telephone Encounter (Signed)
Patient dropped off Benefit Statement for assistance paperwork Placed in nurse box

## 2018-10-09 NOTE — Telephone Encounter (Signed)
Application for assistance completed but need her signature. She states that she did come by previously and signed this information and just needed financial documents. Will review this with Lattie Haw when she returns and complete any needed information.

## 2018-10-10 ENCOUNTER — Ambulatory Visit: Payer: Self-pay | Admitting: Pharmacist

## 2018-10-10 DIAGNOSIS — Z599 Problem related to housing and economic circumstances, unspecified: Secondary | ICD-10-CM

## 2018-10-10 DIAGNOSIS — Z794 Long term (current) use of insulin: Principal | ICD-10-CM

## 2018-10-10 DIAGNOSIS — Z598 Other problems related to housing and economic circumstances: Secondary | ICD-10-CM

## 2018-10-10 DIAGNOSIS — N183 Chronic kidney disease, stage 3 (moderate): Principal | ICD-10-CM

## 2018-10-10 DIAGNOSIS — E1122 Type 2 diabetes mellitus with diabetic chronic kidney disease: Secondary | ICD-10-CM

## 2018-10-10 NOTE — Chronic Care Management (AMB) (Signed)
  Chronic Care Management   Follow Up Note   10/10/2018 Name: Amber Reyes MRN: [id number] DOB: [birth year]  Referred by: Steele Sizer, MD Reason for referral : Chronic Care Management (medication assistance)   Subjective: Amber Reyes is a 73 year old female patient of Dr. Ancil Boozer, engaged with CCM team for education surrounding chronic bronchitis/COPD, weight loss management, and medication assistance. Incoming call from Amber Reyes to [facility] pharmacist today.   Amber Reyes reports that she has left all financial documents necessary to proceed with [employer] (Trulicity) application at the front desk.   Assessment:  Application for [employer] is now completed, submitted by CCM pharmacist. Copy of application uploaded to Media tab   Goals Addressed            This Visit's Progress   . "Some of my medications are really expensive and I can't afford" (pt-stated)   On track    Current Barriers:  . financial  Pharmacist Clinical Goal(s): Over the next 14 days, Amber Reyes will provide the necessary supplementary documents (proof of out of pocket prescription expenditure, proof of household income) needed for medication assistance applications to CCM pharmacist.  10/05/18: Patient states she will bring documents to [place] office within the next week.   Interventions: . CCM pharmacist will apply for medication assistance program for Trulicity and Humalog made by [employer]. o Application submitted on 10/10/18 . Discuss different options for "maintenance" inhaler due to Advair cost.   . Dr. Reino Bellis office is pursuing medication assistance for Eliquis.  Patient Self Care Activities:  Marland Kitchen Gather necessary documents needed to apply for medication assistance  Please see past updates related to this goal by clicking on the "Past Updates" button in the selected goal           Telephone follow up appointment with CCM team member scheduled for: 10/12/18  Ruben Reason,  PharmD Clinical Pharmacist [facility] [phone]

## 2018-10-10 NOTE — Patient Instructions (Signed)
Goals Addressed            This Visit's Progress   . "Some of my medications are really expensive and I can't afford" (pt-stated)   On track    Current Barriers:  . financial  Pharmacist Clinical Goal(s): Over the next 14 days, [name] will provide the necessary supplementary documents (proof of out of pocket prescription expenditure, proof of household income) needed for medication assistance applications to CCM pharmacist.  10/05/18: Patient states she will bring documents to [place] office within the next week.   Interventions: . CCM pharmacist will apply for medication assistance program for Trulicity and Humalog made by [employer]. o Application submitted on 10/10/18 . Discuss different options for "maintenance" inhaler due to Advair cost.   . Dr. Reino Bellis office is pursuing medication assistance for Eliquis.  Patient Self Care Activities:  Marland Kitchen Gather necessary documents needed to apply for medication assistance  Please see past updates related to this goal by clicking on the "Past Updates" button in the selected goal          The patient verbalized understanding of instructions provided today and declined a print copy of patient instruction materials.

## 2018-10-10 NOTE — Telephone Encounter (Signed)
Updated forms have been faxed with patient's income noted.

## 2018-10-11 NOTE — Progress Notes (Signed)
We already cleared her for the same procedure in January and nothing has changed since then.  "Moderate risk given sleep apnea and other co-morbidities. Hold eliquis 2 days before"

## 2018-10-12 ENCOUNTER — Ambulatory Visit: Payer: Self-pay

## 2018-10-12 DIAGNOSIS — N183 Chronic kidney disease, stage 3 (moderate): Principal | ICD-10-CM

## 2018-10-12 DIAGNOSIS — Z794 Long term (current) use of insulin: Principal | ICD-10-CM

## 2018-10-12 DIAGNOSIS — E1122 Type 2 diabetes mellitus with diabetic chronic kidney disease: Secondary | ICD-10-CM

## 2018-10-12 DIAGNOSIS — J42 Unspecified chronic bronchitis: Secondary | ICD-10-CM

## 2018-10-12 DIAGNOSIS — I110 Hypertensive heart disease with heart failure: Secondary | ICD-10-CM

## 2018-10-12 NOTE — Chronic Care Management (AMB) (Signed)
  Chronic Care Management   Follow Up Note   10/13/2018 Name: Amber Reyes MRN: [id number] DOB: 30-Apr-1946  Referred by: Steele Sizer, MD Reason for referral : Chronic Care Management (follow up Chronic Bronchitis)   Subjective: "I am doing much better and my cold has gone away"   Objective:  BP Readings from Last 3 Encounters:  09/25/18 123/60  08/30/18 (!) 152/60  07/19/18 114/70   Lab Results  Component Value Date   HGBA1C 6.6 (A) 08/30/2018   HGBA1C 6.6 08/30/2018   HGBA1C 6.6 (A) 08/30/2018   HGBA1C 6.6 08/30/2018    Assessment: Amber Reyes a 73year old female who sees Dr. Laure Kidney primary care.Dr. Lenox Ahr the CCM team to consult the patient for assistance with chronic disease management related toHTN, Pulmonary HTN, DM, Hyperlipidemia, and CKD. Patient also needs assistance with medication afordibility. Referral was placed1/03/2019.Amber Reyes agreed to [facility] Services and met with the CCM Team 09/21/2018 to establishes health goals. Today the CCM RN CM followed up with Amber Reyes via telephone to reassess recent "cold" symptoms and to follow progression towards goals.  Goals Addressed            This Visit's Progress   . "I don't understand my lung disease" (pt-stated)   On track    Amber Reyes states she is fully recovered from her recent "cold". She is back to her baseline and not experiencing any additional shortness of breath. She has discontinued using her rescue inhaler ever 6 hours. She is able to verbalize COPD action plan and understands importance of daily symptom assessment. She is utilizing pursed lip breathing technique when needed. She is able to use her CPAP nightly again as she was not able to do so when "I was so stopped up". She is taking her medications as prescribed and does not wish to switch to [facility] although financial assistance may be provided. She wishes to continue on her Advair although she has a hard time affording  this medication. She will continue to be follow by CCM RN CM and have ongoing assessments related to her chronic conditions.   Current Barriers:   Knowledge deficit related to obstructive sleep apnea and chronic bronchitis  Nurse Case Manager Clinical Goal(s):   Over the next 30 days, patient will continue to verbalize understanding of COPD/Chronic Bronchitis action plan and disease management.  Over the next 30 days, patient will contact CCM RN CM with any questions regarding COPD/Chronic Bronchitis self care activities and action plan  Interventions:  . Reinforced importance in following COPD/Chronic Bronchitis action plan when "sick" and doing a daily assessment of symptoms . Reinforced utilizing pursed lip breathing to relieve exertional shortness of breath  . Reinforced taking medications as prescribed including cough medications  Patient Self Care Activities:  . Practice pursed lip breathing . Take medications as prescribed and notify MD if medication is not affordable (please consider switching to [facility] if MD approves) . Follows COPD action plan  Please see past updates in goals section for documentation of goal progression          Telephone follow up appointment with CCM team member scheduled for: 1 month     Tekeisha Hakim E. Rollene Rotunda, RN, BSN Nurse Care Coordinator [facility] Care Management  [phone]

## 2018-10-13 NOTE — Patient Instructions (Signed)
  Thank you allowing the Chronic Care Management Team to be a part of your care! It was a pleasure speaking with you today!  1. Continue to take all medications as prescribed. Please inform CCM Team if you are not able to pick up your Advair as alternatives may be prescribed or samples given 2. Assess your breathing daily per COPD/Chronic Bronchitis Action Plan. You always want to be in the [place]. 3. Continue to exercise at least 3 times a week.  4. Contact the CCM Team with any questions, concerns, or needs  CCM (Chronic Care Management) Team   Trish Fountain RN, BSN Nurse Care Coordinator  [phone]  Ruben Reason PharmD  Clinical Pharmacist  [phone]   Goals Addressed            This Visit's Progress   . "I don't understand my lung disease" (pt-stated)   On track    Current Barriers:   Knowledge deficit related to obstructive sleep apnea and chronic bronchitis  Nurse Case Manager Clinical Goal(s):   Over the next 30 days, patient will continue to verbalize understanding of COPD/Chronic Bronchitis action plan and disease management.  Over the next 30 days, patient will contact CCM RN CM with any questions regarding COPD/Chronic Bronchitis self care activities and action plan  Interventions:  . Reinforced importance in following COPD/Chronic Bronchitis action plan when "sick" and doing a daily assessment of symptoms . Reinforced utilizing pursed lip breathing to relieve exertional shortness of breath  . Reinforced taking medications as prescribed including cough medications  Patient Self Care Activities:  . Practice pursed lip breathing . Take medications as prescribed and notify MD if medication is not affordable (please consider switching to [facility] if MD approves) . Follows COPD action plan  Please see past updates in goals section for documentation of goal progression         The patient verbalized understanding of instructions provided today and declined  a print copy of patient instruction materials.   Telephone follow up appointment with CCM team member scheduled for: 1 month

## 2018-10-17 ENCOUNTER — Encounter: Payer: Self-pay | Admitting: Cardiovascular Disease

## 2018-10-17 ENCOUNTER — Ambulatory Visit (INDEPENDENT_AMBULATORY_CARE_PROVIDER_SITE_OTHER): Payer: Medicare PPO | Admitting: Cardiovascular Disease

## 2018-10-17 VITALS — BP 160/70 | HR 88 | Ht 70.0 in | Wt 388.0 lb

## 2018-10-17 DIAGNOSIS — I10 Essential (primary) hypertension: Secondary | ICD-10-CM | POA: Diagnosis not present

## 2018-10-17 DIAGNOSIS — I5032 Chronic diastolic (congestive) heart failure: Secondary | ICD-10-CM | POA: Diagnosis not present

## 2018-10-17 DIAGNOSIS — G4733 Obstructive sleep apnea (adult) (pediatric): Secondary | ICD-10-CM | POA: Diagnosis not present

## 2018-10-17 DIAGNOSIS — I4891 Unspecified atrial fibrillation: Secondary | ICD-10-CM | POA: Diagnosis not present

## 2018-10-17 NOTE — Patient Instructions (Signed)

## 2018-10-17 NOTE — Progress Notes (Signed)
Cardiology Office Note   Date:  10/17/2018   ID:  Clarabell, Matsuoka March 26, 1946, MRN [id number]  PCP:  Steele Sizer, MD  Cardiologist:   Kathlyn Sacramento, MD   Chief Complaint  Patient presents with  . Other    3 month follow up. Patient denies chest pain and SOB at this time. Meds reviewed verbally with patient.       History of Present Illness: YVONNA BRUN is a 73 y.o. female who presents for a followup visit regarding chronic diastolic heart failure and atrial fibrillation. 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  During last visit, she was noted to be in atrial fibrillation which was a new diagnosis.  She was started on Eliquis 5 mg twice daily.  Ventricular rate was controlled.  She was overall minimally symptomatic.  Given her morbid obesity and obstructive sleep apnea, I felt that she was not a good candidate for cardioversion especially that she was mostly asymptomatic.  She was referred to pulmonary and get her CPAP fixed.  She feels better overall.  She has minimal palpitations.  No chest pain.  Past Medical History:  Diagnosis Date  . Allergic rhinitis, cause unspecified   . Anxiety   . Chronic diastolic CHF (congestive heart failure) ([place])   . CKD (chronic kidney disease), stage III ([place])   . Edema   . Essential hypertension, benign   . Gout, unspecified   . Heart murmur    as child  . Hyperlipidemia   . Lipoma of unspecified site   . Other and unspecified disc disorder of lumbar region   . Other general symptoms(780.99)   . PAC (premature atrial contraction)   . Renal insufficiency   . Restless legs syndrome (RLS)   . Sebaceous cyst   . Super obesity   . Type II or unspecified type diabetes mellitus without mention of complication, uncontrolled   . Unspecified asthma(493.90)   .  Unspecified disease of pericardium   . Unspecified sleep apnea   . Unspecified vitamin D deficiency   . Vaginitis and vulvovaginitis, unspecified   . Venous insufficiency     Past Surgical History:  Procedure Laterality Date  . CARDIAC CATHETERIZATION  2002   [facility]  . COLONOSCOPY WITH PROPOFOL N/A 09/25/2018   Procedure: COLONOSCOPY WITH PROPOFOL;  Surgeon: Jonathon Bellows, MD;  Location[facility] ENDOSCOPY;  Service: Gastroenterology;  Laterality: N/A;  . PERICARDIUM SURGERY       Current Outpatient Medications  Medication Sig Dispense Refill  . albuterol (PROVENTIL HFA;VENTOLIN HFA) 108 (90 BASE) MCG/ACT inhaler Inhale 2 puffs into the lungs every 6 (six) hours as needed for wheezing or shortness of breath.    . allopurinol (ZYLOPRIM) 100 MG tablet Take 1 tablet (100 mg total) by mouth 2 (two) times daily. 180 tablet 1  . apixaban (ELIQUIS) 5 MG TABS tablet Take 1 tablet (5 mg total) by mouth 2 (two) times daily. 180 tablet 3  . Blood Glucose Calibration (TRUE METRIX LEVEL 1) Low SOLN 1 each by In Vitro route once a week. 1 each 1  . Blood Glucose Monitoring Suppl (TRUE METRIX AIR GLUCOSE METER) w/Device KIT 1 kit by Does not apply route 3 (three) times daily. 1 kit 0  . Cholecalciferol (VITAMIN D) 2000 UNITS tablet Take 2,000 Units by mouth daily.    . colchicine (COLCRYS) 0.6 MG tablet  Take 1-2 tablets (0.6-1.2 mg total) by mouth daily as needed. 30 tablet 0  . diltiazem (CARDIZEM CD) 300 MG 24 hr capsule Take 1 capsule (300 mg total) by mouth daily. 90 capsule 1  . Dulaglutide (TRULICITY) 1.5 JS/3.1RX SOPN Inject 1.5 mg into the skin once a week. 6 mL 1  . Elastic Bandages & Supports (MEDICAL COMPRESSION STOCKINGS) MISC 2 each by Does not apply route daily. 2 each 2  . fluconazole (DIFLUCAN) 150 MG tablet TAKE ONE TABLET EVERY OTHER DAY AS NEEDED FOR  YEAST  INFECTION 4 tablet 1  . fluticasone (FLONASE) 50 MCG/ACT nasal spray Place 2 sprays into both nostrils daily. 48 g 2  .  Fluticasone-Salmeterol (ADVAIR DISKUS) 250-50 MCG/DOSE AEPB Inhale 1 puff into the lungs 2 (two) times daily. 3 each 1  . furosemide (LASIX) 40 MG tablet Take 1 tablet (40 mg total) by mouth 2 (two) times daily as needed. 180 tablet 1  . Insulin Lispro Prot & Lispro (HUMALOG MIX 75/25 KWIKPEN) (75-25) 100 UNIT/ML Kwikpen Inject 25-55 Units into the skin 2 (two) times daily. 15 pen 1  . Insulin Pen Needle (NOVOFINE) 30G X 8 MM MISC Inject 10 each into the skin daily. 100 each 1  . irbesartan (AVAPRO) 300 MG tablet Take 1 tablet (300 mg total) by mouth daily. 90 tablet 1  . pravastatin (PRAVACHOL) 80 MG tablet Take 1 tablet (80 mg total) by mouth every evening. 90 tablet 1  . TRUE METRIX BLOOD GLUCOSE TEST test strip USE  AS  INSTRUCTED 300 each 1  . TRUEPLUS LANCETS 33G MISC 1 each by Does not apply route 3 (three) times daily. 300 each 2   No current facility-administered medications for this visit.     Allergies:   Patient has no known allergies.    Social History:  The patient  reports that she has never smoked. She has never used smokeless tobacco. She reports that she does not drink alcohol or use drugs.   Family History:  The patient's family history includes Heart attack in her mother; Hypertension in her mother.    ROS:  Please see the history of present illness.   Otherwise, review of systems are positive for none.   All other systems are reviewed and negative.    PHYSICAL EXAM: VS:  BP (!) 160/70 (BP Location: Left Arm, Patient Position: Sitting, Cuff Size: Large)   Pulse 88   Ht '5\' 10"'  (1.778 m)   Wt (!) 388 lb (176 kg)   BMI 55.67 kg/m  , BMI Body mass index is 55.67 kg/m. GEN: Well nourished, well developed, in no acute distress  HEENT: normal  Neck: no JVD, carotid bruits, or masses Cardiac: Irregularly irregular; no rubs, or gallops,no edema . 1/6 systolic murmur at the base Respiratory:  clear to auscultation bilaterally, normal work of breathing GI: soft,  nontender, nondistended, + BS MS: no deformity or atrophy  Skin: warm and dry, no rash Neuro:  Strength and sensation are intact Psych: euthymic mood, full affect   EKG:  EKG is ordered today. The ekg ordered today demonstrates atrial fibrillation with nonspecific T wave changes.  Ventricular rate is 88 bpm.  Recent Labs: 11/25/2017: ALT 7 08/04/2018: BUN 26; Creatinine, Ser 1.33; Hemoglobin 10.2; Platelets 243; Potassium 4.9; Sodium 141    Lipid Panel    Component Value Date/Time   CHOL 138 11/25/2017 0838   CHOL 139 04/16/2015 1138   TRIG 188 (H) 11/25/2017 0838   HDL 42 (  L) 11/25/2017 0838   HDL 52 04/16/2015 1138   CHOLHDL 3.3 11/25/2017 0838   VLDL 19 12/23/2016 0941   LDLCALC 70 11/25/2017 0838      Wt Readings from Last 3 Encounters:  10/17/18 (!) 388 lb (176 kg)  09/25/18 (!) 395 lb (179.2 kg)  08/30/18 (!) 398 lb 8 oz (180.8 kg)       No flowsheet data found.    ASSESSMENT AND PLAN:  1.  Atrial fibrillation:  Chads Vasc score is 5 .  Continue anticoagulation with Eliquis.  She applied for financial assistance as she reports inability to afford the medication.  If cost is an issue, we might have to consider switching her to warfarin.  Due to morbid obesity and  sleep apnea, I do not think we will have good chance of restoring sinus rhythm with cardioversion.  She is also minimally symptomatic. Continue rate control.  Echocardiogram showed normal LV systolic function.  2. Chronic diastolic heart failure: She appears to be euvolemic on current dose of furosemide .     3. Essential hypertension: Blood pressure was elevated initially but repeat by me was 140/60.  I made no changes in her medications.  4.  Obstructive sleep apnea: She was referred to pulmonary and got her CPAP.  She feels better.   Disposition:   FU with me in 6 months  Signed,  Kathlyn Sacramento, MD  10/17/2018 4:38 PM    [place]

## 2018-10-21 DIAGNOSIS — J449 Chronic obstructive pulmonary disease, unspecified: Secondary | ICD-10-CM | POA: Diagnosis not present

## 2018-10-21 DIAGNOSIS — I509 Heart failure, unspecified: Secondary | ICD-10-CM | POA: Diagnosis not present

## 2018-10-21 DIAGNOSIS — E1122 Type 2 diabetes mellitus with diabetic chronic kidney disease: Secondary | ICD-10-CM | POA: Diagnosis not present

## 2018-10-21 DIAGNOSIS — I4891 Unspecified atrial fibrillation: Secondary | ICD-10-CM | POA: Diagnosis not present

## 2018-10-21 DIAGNOSIS — M545 Low back pain: Secondary | ICD-10-CM | POA: Diagnosis not present

## 2018-10-21 DIAGNOSIS — I130 Hypertensive heart and chronic kidney disease with heart failure and stage 1 through stage 4 chronic kidney disease, or unspecified chronic kidney disease: Secondary | ICD-10-CM | POA: Diagnosis not present

## 2018-10-21 DIAGNOSIS — M109 Gout, unspecified: Secondary | ICD-10-CM | POA: Diagnosis not present

## 2018-10-21 DIAGNOSIS — E785 Hyperlipidemia, unspecified: Secondary | ICD-10-CM | POA: Diagnosis not present

## 2018-10-23 ENCOUNTER — Telehealth: Payer: Self-pay

## 2018-10-23 NOTE — Telephone Encounter (Signed)
Samples placed up front 

## 2018-10-23 NOTE — Telephone Encounter (Signed)
Medication Samples have been provided to the patient.  Drug name: Eliquis       Strength: 5MG         Qty: 2 boxes  LOT: [id number]  Exp.Date: 06/22  Kennis Carina [place] 3:10 PM 10/23/2018

## 2018-10-23 NOTE — Telephone Encounter (Signed)
Pt would like Eliquis samples.  

## 2018-10-24 ENCOUNTER — Ambulatory Visit: Payer: Self-pay | Admitting: Pharmacist

## 2018-10-24 DIAGNOSIS — Z794 Long term (current) use of insulin: Principal | ICD-10-CM

## 2018-10-24 DIAGNOSIS — Z599 Problem related to housing and economic circumstances, unspecified: Secondary | ICD-10-CM

## 2018-10-24 DIAGNOSIS — E1122 Type 2 diabetes mellitus with diabetic chronic kidney disease: Secondary | ICD-10-CM

## 2018-10-24 DIAGNOSIS — N183 Chronic kidney disease, stage 3 unspecified: Secondary | ICD-10-CM

## 2018-10-24 DIAGNOSIS — Z598 Other problems related to housing and economic circumstances: Secondary | ICD-10-CM

## 2018-10-24 NOTE — Patient Instructions (Signed)
Goals Addressed            This Visit's Progress   . "Some of my medications are really expensive and I can't afford" (pt-stated)       Current Barriers:  . financial  Pharmacist Clinical Goal(s): Over the next 14 days, Amber Reyes will provide the necessary supplementary documents (proof of out of pocket prescription expenditure, proof of household income) needed for medication assistance applications to CCM pharmacist.  10/05/18: Patient states she will bring documents to [place] office within the next week.   Interventions: . CCM pharmacist will apply for medication assistance program for Trulicity and Humalog made by [employer]. o Application submitted on 10/10/18 o Goal partially completed: Trulicity approved 10/22/52 until 10/23/19 . Discuss different options for "maintenance" inhaler due to Advair cost.   . Dr. Reino Bellis office is pursuing medication assistance for Eliquis.  Patient Self Care Activities:  Marland Kitchen Gather necessary documents needed to apply for medication assistance  Please see past updates related to this goal by clicking on the "Past Updates" button in the selected goal         The patient verbalized understanding of instructions provided today and declined a print copy of patient instruction materials.

## 2018-10-24 NOTE — Chronic Care Management (AMB) (Signed)
  Chronic Care Management   Follow Up Note   10/24/2018 Name: Amber Reyes MRN: [id number] DOB: 1946-01-15  Referred by: Steele Sizer, MD Reason for referral : Chronic Care Management (Medication assistance )  Amber Reyes is a 73 y.o. year old female who is a primary care patient of Steele Sizer, MD. Successful outreach to Amber Reyes as follow up for medication assistance. HIPAA identifiers verified.  Medication Assistance: Patient is has been approved for Trulicity made by [employer] and prescribed by Dr. Steele Sizer. Approved 10/23/18 and expires 10/23/19. Approval letter uploaded under media tab.  Follow up: Patient should receive medications via delivery to the practice in 10 business days. Counseled patient on applicable refill process.     Goals Addressed            This Visit's Progress   . "Some of my medications are really expensive and I can't afford" (pt-stated)       Current Barriers:  . financial  Pharmacist Clinical Goal(s): Over the next 14 days, Amber Reyes will provide the necessary supplementary documents (proof of out of pocket prescription expenditure, proof of household income) needed for medication assistance applications to CCM pharmacist.  10/05/18: Patient states she will bring documents to [place] office within the next week.   Interventions: . CCM pharmacist will apply for medication assistance program for Trulicity and Humalog made by [employer]. o Application submitted on 10/10/18 o Goal partially completed: Trulicity approved 02/26/71 until 10/23/19 . Discuss different options for "maintenance" inhaler due to Advair cost.   . Dr. Reino Bellis office is pursuing medication assistance for Eliquis.  Patient Self Care Activities:  Marland Kitchen Gather necessary documents needed to apply for medication assistance  Please see past updates related to this goal by clicking on the "Past Updates" button in the selected goal           Telephone follow  up appointment with CCM team member scheduled for: 11/10/18 with RNCM    Ruben Reason, PharmD Clinical Pharmacist [place] [phone]

## 2018-10-26 ENCOUNTER — Ambulatory Visit
Admission: RE | Admit: 2018-10-26 | Discharge: 2018-10-26 | Disposition: A | Discharge status: Home / Self Care | Payer: Medicare PPO | Source: Ambulatory Visit | Attending: Family Medicine | Admitting: Family Medicine

## 2018-10-26 DIAGNOSIS — Z1231 Encounter for screening mammogram for malignant neoplasm of breast: Secondary | ICD-10-CM

## 2018-11-01 ENCOUNTER — Other Ambulatory Visit: Payer: Self-pay | Admitting: Family Medicine

## 2018-11-01 DIAGNOSIS — E559 Vitamin D deficiency, unspecified: Secondary | ICD-10-CM | POA: Diagnosis not present

## 2018-11-01 DIAGNOSIS — E785 Hyperlipidemia, unspecified: Secondary | ICD-10-CM | POA: Diagnosis not present

## 2018-11-01 DIAGNOSIS — N183 Chronic kidney disease, stage 3 (moderate): Secondary | ICD-10-CM | POA: Diagnosis not present

## 2018-11-01 DIAGNOSIS — I129 Hypertensive chronic kidney disease with stage 1 through stage 4 chronic kidney disease, or unspecified chronic kidney disease: Secondary | ICD-10-CM | POA: Diagnosis not present

## 2018-11-01 DIAGNOSIS — G4733 Obstructive sleep apnea (adult) (pediatric): Secondary | ICD-10-CM | POA: Diagnosis not present

## 2018-11-01 DIAGNOSIS — M109 Gout, unspecified: Secondary | ICD-10-CM | POA: Diagnosis not present

## 2018-11-06 ENCOUNTER — Telehealth: Payer: Self-pay | Admitting: Cardiovascular Disease

## 2018-11-06 ENCOUNTER — Telehealth: Payer: Self-pay

## 2018-11-06 NOTE — Telephone Encounter (Signed)
I will call patient assistance and see what is going on with her application.

## 2018-11-06 NOTE — Telephone Encounter (Signed)
Patient calling the office for samples of medication:   1.  What medication and dosage are you requesting samples for? Eliquis 5 MG  2.  Are you currently out of this medication? Will be out tomorrow

## 2018-11-06 NOTE — Telephone Encounter (Signed)
Spoke with Elana from Eliquis patient assistance.  She stated that patient must pay $804.86 out of pocket before she will be approved for this medication.   Patient is requesting samples.

## 2018-11-06 NOTE — Telephone Encounter (Signed)
Different telephone encounter has already been started and routed to nurse.

## 2018-11-08 NOTE — Telephone Encounter (Signed)
Patient calling  States she is out of Eliquis Would like to know what to take in the meantime  Please call to discuss

## 2018-11-09 ENCOUNTER — Telehealth: Payer: Self-pay

## 2018-11-09 NOTE — Telephone Encounter (Signed)
Patient called and informed that she has been denied for eliquis assistance.   Samples have been provided in the meantime.   Medication Samples have been provided to the patient.  Drug name: Eliquis        Strength: 5 mg         Qty: 4 boxes   LOT: [id number]  Exp.Date: 6/20200

## 2018-11-09 NOTE — Telephone Encounter (Signed)
If she is not able to afford Eliquis, I recommend switching to warfarin and referring her to our anticoagulation clinic.

## 2018-11-10 ENCOUNTER — Ambulatory Visit (INDEPENDENT_AMBULATORY_CARE_PROVIDER_SITE_OTHER): Payer: Medicare PPO | Admitting: Pharmacist

## 2018-11-10 ENCOUNTER — Ambulatory Visit: Payer: Medicare PPO

## 2018-11-10 ENCOUNTER — Other Ambulatory Visit: Payer: Self-pay

## 2018-11-10 DIAGNOSIS — E1122 Type 2 diabetes mellitus with diabetic chronic kidney disease: Secondary | ICD-10-CM | POA: Diagnosis not present

## 2018-11-10 DIAGNOSIS — R809 Proteinuria, unspecified: Secondary | ICD-10-CM

## 2018-11-10 DIAGNOSIS — I110 Hypertensive heart disease with heart failure: Secondary | ICD-10-CM

## 2018-11-10 DIAGNOSIS — Z794 Long term (current) use of insulin: Secondary | ICD-10-CM

## 2018-11-10 DIAGNOSIS — E1129 Type 2 diabetes mellitus with other diabetic kidney complication: Secondary | ICD-10-CM

## 2018-11-10 DIAGNOSIS — J42 Unspecified chronic bronchitis: Secondary | ICD-10-CM

## 2018-11-10 DIAGNOSIS — Z599 Problem related to housing and economic circumstances, unspecified: Secondary | ICD-10-CM

## 2018-11-10 DIAGNOSIS — Z598 Other problems related to housing and economic circumstances: Secondary | ICD-10-CM

## 2018-11-10 DIAGNOSIS — N183 Chronic kidney disease, stage 3 (moderate): Secondary | ICD-10-CM | POA: Diagnosis not present

## 2018-11-10 DIAGNOSIS — I4891 Unspecified atrial fibrillation: Secondary | ICD-10-CM | POA: Diagnosis not present

## 2018-11-10 MED ORDER — APIXABAN 5 MG PO TABS: 5.0000 mg | ORAL_TABLET | Freq: Two times a day (BID) | ORAL | 3 refills | Status: DC

## 2018-11-10 NOTE — Patient Instructions (Addendum)
Thank you allowing the Chronic Care Management Team to be a part of your care! It was a pleasure speaking with you today!  1. Continue to take all medications as prescribed. Please inform CCM Team if you are not able to pick up your Advair as alternatives may be prescribed or samples given. 2. Assess your breathing daily per COPD/Chronic Bronchitis Action Plan. You always want to be in the [place]. 3. Continue to exercise at least 3 times a week.  4. Continue to check you BP daily and notify CCM RN CM/MD if your pressure is consistently >140/90 5. Weigh yourself daily. If you gain 3 lbs over night or 5 lbs in one week, you need to notify your doctor 6. Take your diuretics and blood pressure medications as prescribed and limit your sodium intake to 2048m/day. 7. Please check your email for EMMI and review all education provided to you. 8. PLEASE call me if you have any questions!  CCM (Chronic Care Management) Team   PTrish FountainRN, BSN Nurse Care Coordinator  [phone] JRuben ReasonPharmD  Clinical Pharmacist  [phone]  CElliot Gurney LCSW Clinical Social Worker ([phone] Goals Addressed            This Visit's Progress   . COMPLETED: "I don't understand my lung disease" (pt-stated)       Current Barriers:   Knowledge deficit related to obstructive sleep apnea and chronic bronchitis  Nurse Case Manager Clinical Goal(s):   Over the next 30 days, patient will continue to verbalize understanding of COPD/Chronic Bronchitis action plan and disease management.  Over the next 30 days, patient will contact CCM RN CM with any questions regarding COPD/Chronic Bronchitis self care activities and action plan  Interventions:  . Reinforced importance in following COPD/Chronic Bronchitis action plan when "sick" and doing a daily assessment of symptoms . Reinforced utilizing pursed lip breathing to relieve exertional shortness of breath  . Reinforced taking  medications as prescribed including cough medications  Patient Self Care Activities:  . Practice pursed lip breathing . Take medications as prescribed and notify MD if medication is not affordable ([facility] . Follows COPD action plan  Please see past updates in goals section for documentation of goal progression      . "I don't weigh myself every day. Do I need to?" (pt-stated)       Current Barriers:  .Marland KitchenKnowledge deficit related to basic heart failure pathophysiology and self care management . Lack of scale in home   Nurse Case Manager Clinical Goal(s):   Over the next 30 days, patient will weigh self daily and record  Over the next 30 days, patient will verbalize understanding of Heart Failure Action Plan and when to call doctor  Over the next 30 days, patient will take all Heart Failure mediations as prescribed  Interventions:  . Basic overview and discussion of pathophysiology of Heart Failure . Provided written and verbal education on low sodium diet . Reviewed Heart Failure Action Plan in depth and provided written copy . Assessed for scales in home . Discussed importance of daily weight . Reviewed role of diuretics in prevention of fluid overload  Patient Self Care Activities:  . Take Heart Failure Medications as prescribed . Weigh daily and record (notify MD with 3 lb weight gain over night or 5 lb in a week) . Follow CHF Action Plan . Adhere to low sodium diet  Initial goal  documentation    . "I really want to loose this weight" (pt-stated)       Current Barriers:  Marland Kitchen Knowledge deficit related to how to loose weight   Nurse Case Manager Clinical Goal(s):   Over the next 14 days, patient will consider referral to nutritionist/weight loss clinic-goals met  11/10/2018  Over the next 30 days, patient will continue to reduce caloric intake and increase daily activity to facilitate continued weight loss.   Interventions:   . Provided emotional support and reassurance . Reinforced how excess weight contributes to chronic diseases . Reviewed options for weight loss including referral to [facility] nutrition- patient declined . Assessed for scales in the home . Provided written information on healthy weight loss stratagies  Patient Self Care Activities:  . Take medications as prescribed . Follow all action   *initial goal documentation        Print copy of patient instructions provided.   Telephone follow up appointment with CCM team member scheduled for: 30 days    HEART FAILURE ACTION PLAN Actions to Take if My Symptoms Get Worse   What ZONE are you in today? Green, Yellow, or Red?   GREEN ZONE: This is your goal  . No shortness of breath or trouble breathing. . No weight gain of more then 3 pounds in one day or 5 pounds in a week. Marland Kitchen No swelling in your feet, ankles, stomach, or hands. . No chest discomfort, heaviness, or pain .   YELLOW ZONE: Call your doctor TODAY to get help  You may have one or more of the following:  . Weight gain of 3 pounds in 1 day or 5 pounds in 1 week. . More swelling of your feet, ankles, stomach, or hands. . It is harder for you to breathe when lying down. You need to sit up.  . Chest discomfort, heaviness, or pain. . You feel more tired or have less energy than normal. . New or worsening dizziness. . Dry hacky cough. . You feel uneasy and you know something is not right.   RED ZONE: EMERGENCY CALL 911!  . Struggling to breathe. This does not go away when you sit up . Stronger and more regular amounts of chest discomfort.  . New confusion or cant think clearly.  . Fainting or near fainting.      Living With Heart Failure  Heart failure is a long-term (chronic) condition in which the heart cannot pump enough blood through the body. When this happens, parts of the body do not get the blood and oxygen they need. There is no cure for heart failure at this time,  so it is important for you to take good care of yourself and follow the treatment plan set by your health care provider. If you are living with heart failure, there are ways to help you manage the disease. Follow these instructions at home: Living with heart failure requires you to make changes in your life. Your health care team will teach you about the changes you need to make in order to relieve your symptoms and lower your risk of going to the hospital. Follow the treatment plan as set by your health care provider. Medicines Medicines are important in reducing your heart's workload, slowing the progression of heart failure, and improving your symptoms.  Take over-the-counter and prescription medicines only as told by your health care provider.  Do not stop taking your medicine unless your health care provider tells you to do that.  Do not skip any dose of your medicine.  Refill prescriptions before you run out of medicine. You need your medicines every day. Eating and drinking   Eat heart-healthy foods. Talk with a dietitian to make an eating plan that is right for you. ? If directed by your health care provider: ? Limit salt (sodium). Lowering your sodium intake may reduce symptoms of heart failure. Ask a dietitian to recommend heart-healthy seasonings. ? Limit your fluid intake. Fluid restriction may reduce symptoms of heart failure. ? Use low-fat cooking methods instead of frying. Low-fat methods include roasting, grilling, broiling, baking, poaching, steaming, and stir-frying. ? Choose foods that contain no trans fat and are low in saturated fat and cholesterol. Healthy choices include fresh or frozen fruits and vegetables, fish, lean meats, legumes, fat-free or low-fat dairy products, and whole-grain or high-fiber foods.  Limit alcohol intake to no more than 1 drink a day for nonpregnant women and 2 drinks a day for men. One drink equals 12 oz of beer, 5 oz of wine, or 1 oz of hard  liquor. ? Drinking more than that is harmful to your heart. Tell your health care provider if you drink alcohol several times a week. ? Talk with your health care provider about whether any level of alcohol use is safe for you. Activity   Ask your health care provider about attending cardiac rehabilitation. These programs include aerobic physical activity, which provides many benefits for your heart.  If no cardiac rehabilitation program is available, ask your health care provider what aerobic exercises are safe for you to do. Lifestyle Make the lifestyle changes recommended by your health care provider. In general:  Lose weight if your health care provider tells you to do that. Weight loss may reduce symptoms of heart failure.  Do not use any products that contain nicotine or tobacco, such as cigarettes or e-cigarettes. If you need help quitting, ask your health care provider.  Do not use street (illegal) drugs.  Return to your normal activities as told by your health care provider. Ask your health care provider what activities are safe for you. General instructions   Make sure you weigh yourself every day to track your weight. Rapid weight gain may indicate an increase in fluid in your body and may increase the workload of your heart. ? Weigh yourself every morning. Do this after you urinate but before you eat breakfast. ? Wear the same type of clothing, without shoes, each time you weigh yourself. ? Weigh yourself on the same scale and in the same spot each time.  Living with chronic heart failure often leads to emotions such as fear, stress, anxiety, and depression. If you feel any of these emotions and need help coping, contact your health care provider. Other ways to get help include: ? Talking to friends and family members about your condition. They can give you support and guidance. Explain your symptoms to them and, if comfortable, invite them to attend appointments or  rehabilitation with you. ? Joining a support group for people with chronic heart failure. Talking with other people who have the same symptoms may give you new ways of coping with your disease and your emotions.  Stay up to date with your shots (vaccines). Staying current on pneumococcal and influenza vaccines is especially important in preventing germs from attacking your airways (respiratory infections).  Keep all follow-up visits as told by your health care provider. This is important. How to recognize changes in your condition You  and your family members need to know what changes to watch for in your condition. Watch for the following changes and report them to your health care provider:  Sudden weight gain. Ask your health care provider what amount of weight gain to report.  Shortness of breath: ? Feeling short of breath while at rest, with no exercise or activity that required great effort. ? Feeling breathless with activity.  Swelling of your lower legs or ankles.  Difficulty sleeping: ? You wake up feeling short of breath. ? You have to use more pillows to raise your head in order to sleep.  Frequent, dry, hacking cough.  Loss of appetite.  Feeling more tired all the time.  Depression or feelings of sadness or hopelessness.  Bloating in the stomach. Where to find more information  Local support groups. Ask your health care provider about groups near you.  The American Heart Association: www.heart.org Contact a health care provider if:  You have a rapid weight gain.  You have increasing shortness of breath that is unusual for you.  You are unable to participate in your usual physical activities.  You tire easily.  You cough more than normal, especially with physical activity.  You have any swelling or more swelling in areas such as your hands, feet, ankles, or abdomen.  You feel like your heart is beating quickly (palpitations).  You become dizzy or  light-headed when you stand up. Get help right away if:  You have difficulty breathing.  You notice or your family notices a change in your awareness, such as having trouble staying awake or having difficulty with concentration.  You have pain or discomfort in your chest.  You have an episode of fainting (syncope). Summary  There is no cure for heart failure, so it is important for you to take good care of yourself and follow the treatment plan set by your health care provider.  Medicines are important in reducing your heart's workload, slowing the progression of heart failure, and improving your symptoms.  Living with chronic heart failure often leads to emotions such as fear, stress, anxiety, and depression. If you are feeling any of these emotions and need help coping, contact your health care provider. This information is not intended to replace advice given to you by your health care provider. Make sure you discuss any questions you have with your health care provider. Document Released: 12/22/2016 Document Revised: 12/22/2016 Document Reviewed: 12/22/2016 Elsevier Interactive Patient Education  2019 [place] for [employer] Loss Calories are units of energy. Your body needs a certain amount of calories from food to keep you going throughout the day. When you eat more calories than your body needs, your body stores the extra calories as fat. When you eat fewer calories than your body needs, your body burns fat to get the energy it needs. Calorie counting means keeping track of how many calories you eat and drink each day. Calorie counting can be helpful if you need to lose weight. If you make sure to eat fewer calories than your body needs, you should lose weight. Ask your health care provider what a healthy weight is for you. For calorie counting to work, you will need to eat the right number of calories in a day in order to lose a healthy amount of weight per week.  A dietitian can help you determine how many calories you need in a day and will give you suggestions on how to reach your  calorie goal.   A healthy amount of weight to lose per week is usually 1-2 lb (0.5-0.9 kg). This usually means that your daily calorie intake should be reduced by 500-750 calories.  Eating 1,200 - 1,500 calories per day can help most women lose weight.  Eating 1,500 - 1,800 calories per day can help most men lose weight.  What do I need to know about calorie counting? In order to meet your daily calorie goal, you will need to:  Find out how many calories are in each food you would like to eat. Try to do this before you eat.  Decide how much of the food you plan to eat.  Write down what you ate and how many calories it had. Doing this is called keeping a food log. To successfully lose weight, it is important to balance calorie counting with a healthy lifestyle that includes regular activity. Aim for 150 minutes of moderate exercise (such as walking) or 75 minutes of vigorous exercise (such as running) each week. Where do I find calorie information?  The number of calories in a food can be found on a Nutrition Facts label. If a food does not have a Nutrition Facts label, try to look up the calories online or ask your dietitian for help. Remember that calories are listed per serving. If you choose to have more than one serving of a food, you will have to multiply the calories per serving by the amount of servings you plan to eat. For example, the label on a package of bread might say that a serving size is 1 slice and that there are 90 calories in a serving. If you eat 1 slice, you will have eaten 90 calories. If you eat 2 slices, you will have eaten 180 calories. How do I keep a food log? Immediately after each meal, record the following information in your food log:  What you ate. Don't forget to include toppings, sauces, and other extras on the food.  How much you ate.  This can be measured in cups, ounces, or number of items.  How many calories each food and drink had.  The total number of calories in the meal. Keep your food log near you, such as in a small notebook in your pocket, or use a mobile app or website. Some programs will calculate calories for you and show you how many calories you have left for the day to meet your goal. What are some calorie counting tips?   Use your calories on foods and drinks that will fill you up and not leave you hungry: ? Some examples of foods that fill you up are nuts and nut butters, vegetables, lean proteins, and high-fiber foods like whole grains. High-fiber foods are foods with more than 5 g fiber per serving. ? Drinks such as sodas, specialty coffee drinks, alcohol, and juices have a lot of calories, yet do not fill you up.  Eat nutritious foods and avoid empty calories. Empty calories are calories you get from foods or beverages that do not have many vitamins or protein, such as candy, sweets, and soda. It is better to have a nutritious high-calorie food (such as an avocado) than a food with few nutrients (such as a bag of chips).  Know how many calories are in the foods you eat most often. This will help you calculate calorie counts faster.  Pay attention to calories in drinks. Low-calorie drinks include water and unsweetened drinks.  Pay attention to  nutrition labels for "low fat" or "fat free" foods. These foods sometimes have the same amount of calories or more calories than the full fat versions. They also often have added sugar, starch, or salt, to make up for flavor that was removed with the fat.  Find a way of tracking calories that works for you. Get creative. Try different apps or programs if writing down calories does not work for you. What are some portion control tips?  Know how many calories are in a serving. This will help you know how many servings of a certain food you can have.  Use a  measuring cup to measure serving sizes. You could also try weighing out portions on a kitchen scale. With time, you will be able to estimate serving sizes for some foods.  Take some time to put servings of different foods on your favorite plates, bowls, and cups so you know what a serving looks like.  Try not to eat straight from a bag or box. Doing this can lead to overeating. Put the amount you would like to eat in a cup or on a plate to make sure you are eating the right portion.  Use smaller plates, glasses, and bowls to prevent overeating.  Try not to multitask (for example, watch TV or use your computer) while eating. If it is time to eat, sit down at a table and enjoy your food. This will help you to know when you are full. It will also help you to be aware of what you are eating and how much you are eating. What are tips for following this plan? Reading food labels  Check the calorie count compared to the serving size. The serving size may be smaller than what you are used to eating.  Check the source of the calories. Make sure the food you are eating is high in vitamins and protein and low in saturated and trans fats. Shopping  Read nutrition labels while you shop. This will help you make healthy decisions before you decide to purchase your food.  Make a grocery list and stick to it. Cooking  Try to cook your favorite foods in a healthier way. For example, try baking instead of frying.  Use low-fat dairy products. Meal planning  Use more fruits and vegetables. Half of your plate should be fruits and vegetables.  Include lean proteins like poultry and fish. How do I count calories when eating out?  Ask for smaller portion sizes.  Consider sharing an entree and sides instead of getting your own entree.  If you get your own entree, eat only half. Ask for a box at the beginning of your meal and put the rest of your entree in it so you are not tempted to eat it.  If calories  are listed on the menu, choose the lower calorie options.  Choose dishes that include vegetables, fruits, whole grains, low-fat dairy products, and lean protein.  Choose items that are boiled, broiled, grilled, or steamed. Stay away from items that are buttered, battered, fried, or served with cream sauce. Items labeled "crispy" are usually fried, unless stated otherwise.  Choose water, low-fat milk, unsweetened iced tea, or other drinks without added sugar. If you want an alcoholic beverage, choose a lower calorie option such as a glass of wine or light beer.  Ask for dressings, sauces, and syrups on the side. These are usually high in calories, so you should limit the amount you eat.  If you want  a salad, choose a garden salad and ask for grilled meats. Avoid extra toppings like bacon, cheese, or fried items. Ask for the dressing on the side, or ask for olive oil and vinegar or lemon to use as dressing.  Estimate how many servings of a food you are given. For example, a serving of cooked rice is  cup or about the size of half a baseball. Knowing serving sizes will help you be aware of how much food you are eating at restaurants. The list below tells you how big or small some common portion sizes are based on everyday objects: ? 1 oz-4 stacked dice. ? 3 oz-1 deck of cards. ? 1 tsp-1 die. ? 1 Tbsp- a ping-pong ball. ? 2 Tbsp-1 ping-pong ball. ?  cup- baseball. ? 1 cup-1 baseball. Summary  Calorie counting means keeping track of how many calories you eat and drink each day. If you eat fewer calories than your body needs, you should lose weight.  A healthy amount of weight to lose per week is usually 1-2 lb (0.5-0.9 kg). This usually means reducing your daily calorie intake by 500-750 calories.  The number of calories in a food can be found on a Nutrition Facts label. If a food does not have a Nutrition Facts label, try to look up the calories online or ask your dietitian for help.  Use  your calories on foods and drinks that will fill you up, and not on foods and drinks that will leave you hungry.  Use smaller plates, glasses, and bowls to prevent overeating. This information is not intended to replace advice given to you by your health care provider. Make sure you discuss any questions you have with your health care provider. Document Released: 08/09/2005 Document Revised: 04/28/2018 Document Reviewed: 07/09/2016 Elsevier Interactive Patient Education  2019 [employer].

## 2018-11-10 NOTE — Chronic Care Management (AMB) (Signed)
  Chronic Care Management   Follow Up Note   11/10/2018 Name: ADRYANA MOGENSEN MRN: [id number] DOB: 11-Oct-1945  Referred by: Steele Sizer, MD Reason for referral : Chronic Care Management (Medication assistance (Eliquis))   SHONTE BEUTLER is a 73 y.o. year old female who is a primary care patient of Steele Sizer, MD. The CCM team was consulted for assistance with chronic disease management and care coordination needs.    Review of patient status, including review of consultants reports, relevant laboratory and other test results, and collaboration with appropriate care team members and the patient's provider was performed as part of comprehensive patient evaluation and provision of chronic care management services.   Assessment/Plan: Mrs. Rocco was told by [facility] her Eliquis medication assistance application was denied until she spent $800. Provided counseling to patient regarding [place] requirements:  - must spend 3% of household income on prescription costs - the prescription costs may be turned in for the household - [employer] has a "credit" system that increases the value of expenditure; CCM pharmacist doesn't know exact calculation - [employer] may be providing every patient a $200 credit due to COVID 19.   Mrs. Puthoff states that she doesn't think she turned in her and her spouse's prescription costs to Dr. Tyrell Antonio office. I will coordinate with the RN that submitted the application there to get more information. Also provided counseling to Mrs. Berkemeier on the kinds of forms she can turn in for herself and her husband.   Goals Addressed            This Visit's Progress   . "Some of my medications are really expensive and I can't afford" (pt-stated)       Current Barriers:  . financial  Pharmacist Clinical Goal(s): Over the next 14 days, [name] will provide the necessary supplementary documents (proof of out of pocket prescription expenditure, proof of  household income) needed for medication assistance applications to CCM pharmacist.  10/05/18: Patient states she will bring documents to [place] office within the next week.   Interventions: . CCM pharmacist will apply for medication assistance program for Trulicity and Humalog made by [employer]. o Application submitted on 10/10/18 o Goal partially completed: Trulicity approved 11/27/63 until 10/23/19 . Discuss different options for "maintenance" inhaler due to Advair cost.   . Dr. Reino Bellis office is pursuing medication assistance for Eliquis. o Denied: patient needs to spend approximately $800 OOP on rxs  Patient Self Care Activities:  Marland Kitchen Gather necessary documents needed to apply for medication assistance  Please see past updates related to this goal by clicking on the "Past Updates" button in the selected goal           The CM team will reach out to the patient again over the next 14 days.    Ruben Reason, PharmD Clinical Pharmacist [facility] [phone]

## 2018-11-10 NOTE — Telephone Encounter (Signed)
The patient stated that she would rather stay on Eliquis for now. She stated that her family will help her out. The medication has been sent into [place].   She will keep the office updated on the status.

## 2018-11-10 NOTE — Patient Instructions (Signed)
Goals Addressed            This Visit's Progress   . "Some of my medications are really expensive and I can't afford" (pt-stated)       Current Barriers:  . financial  Pharmacist Clinical Goal(s): Over the next 14 days, [name] will provide the necessary supplementary documents (proof of out of pocket prescription expenditure, proof of household income) needed for medication assistance applications to CCM pharmacist.  10/05/18: Patient states she will bring documents to [place] office within the next week.   Interventions: . CCM pharmacist will apply for medication assistance program for Trulicity and Humalog made by [employer]. o Application submitted on 10/10/18 o Goal partially completed: Trulicity approved 11/27/13 until 10/23/19 . Discuss different options for "maintenance" inhaler due to Advair cost.   . Dr. Reino Bellis office is pursuing medication assistance for Eliquis. o Denied: patient needs to spend approximately $800 OOP on rxs  Patient Self Care Activities:  Marland Kitchen Gather necessary documents needed to apply for medication assistance  Please see past updates related to this goal by clicking on the "Past Updates" button in the selected goal          The patient verbalized understanding of instructions provided today and declined a print copy of patient instruction materials.

## 2018-11-10 NOTE — Addendum Note (Signed)
Addended by: Ricci Barker on: 11/10/2018 10:46 AM   Modules accepted: Orders

## 2018-11-11 NOTE — Chronic Care Management (AMB) (Signed)
Chronic Care Management   Follow Up Note   11/11/2018 Name: Amber Reyes MRN: [id number] DOB: 08-Jan-1946  Referred by: Amber Sizer, MD Reason for referral : Chronic Care Management (COPD/Chronic Bronchitis/Wt loss follow up)   Subjective: "I am doing good but my cardiologist office told me I did not qualify for assistance with Eliquis until I spend 800.00" "I have lost 5 lbs just by eating healthier"   Objective:  Lab Results  Component Value Date   HGBA1C 6.6 (A) 08/30/2018   HGBA1C 6.6 08/30/2018   HGBA1C 6.6 (A) 08/30/2018   HGBA1C 6.6 08/30/2018   BP Readings from Last 3 Encounters:  10/17/18 (!) 160/70  09/25/18 123/60  08/30/18 (!) 152/60   Wt Readings from Last 3 Encounters:  10/17/18 (!) 388 lb (176 kg)  09/25/18 (!) 395 lb (179.2 kg)  08/30/18 (!) 398 lb 8 oz (180.8 kg)   Assessment: Amber Reyes a 73year old female who sees Dr. Laure Reyes primary care.Dr. Lenox Reyes the CCM team to consult the patient for assistance with chronic disease management related toHTN, Pulmonary HTN, DM, Hyperlipidemia, and CKD. Patient also needs assistance with medication afordibility. Referral was placed1/03/2019.Amber Reyes agreed to [facility] and met with the CCM Team1/30/2020to establishes health goals. Today the CCM RN CM followed up with Amber Reyes via telephone to follow progression towards goals and to address any question, concerns, or educational needs related to COVID-19.   Goals Addressed            This Visit's Progress   . COMPLETED: "I don't understand my lung disease" (pt-stated)       Current Barriers:   Knowledge deficit related to obstructive sleep apnea and chronic bronchitis  Nurse Case Manager Clinical Goal(s):   Over the next 30 days, patient will continue to verbalize understanding of COPD/Chronic Bronchitis action plan and disease management.  Over the next 30 days, patient will contact CCM RN CM with any questions  regarding COPD/Chronic Bronchitis self care activities and action plan  Interventions:  . Reinforced importance in following COPD/Chronic Bronchitis action plan when "sick" and doing a daily assessment of symptoms . Reinforced utilizing pursed lip breathing to relieve exertional shortness of breath  . Reinforced taking medications as prescribed including cough medications  Patient Self Care Activities:  . Practice pursed lip breathing . Take medications as prescribed and notify MD if medication is not affordable (please consider switching to [facility] if MD approves) . Follows COPD action plan  Please see past updates in goals section for documentation of goal progression      . "I don't weigh myself every day. Do I need to?" (pt-stated)       Amber Reyes has a history of CHF. She is currently not weighing herself daily nor is she aware of the CHF action plan. She is motivated to do all that she can to improve her health. She is monitoring her BS daily. Reports readings from 82 (she denied symptoms of hypoglycemia) to 124. She is checking her BP twice daily and reports readings in the 130s-140s. She is not weighing daily as she was unaware she needed to. She request education and understanding of how to manage her CHF through self monitoring.  Current Barriers:  Marland Kitchen Knowledge deficit related to basic heart failure pathophysiology and self care management . Lack of scale in home   Nurse Case Manager Clinical Goal(s):   Over the next 30 days, patient will weigh self daily and record  Over  the next 30 days, patient will verbalize understanding of Heart Failure Action Plan and when to call doctor  Over the next 30 days, patient will take all Heart Failure mediations as prescribed  Interventions:  . Basic overview and discussion of pathophysiology of Heart Failure . Provided written and verbal education on low sodium diet . Reviewed Heart Failure Action Plan in depth and provided written copy  . Assessed for scales in home . Discussed importance of daily weight . Reviewed role of diuretics in prevention of fluid overload  Patient Self Care Activities:  . Take Heart Failure Medications as prescribed . Weigh daily and record (notify MD with 3 lb weight gain over night or 5 lb in a week) . Follow CHF Action Plan . Adhere to low sodium diet  Initial goal documentation    . "I really want to loose this weight" (pt-stated)   On track    Amber Reyes has changed her overall eating habits to include portion reductions, more fresh fruits and vegetables, and carbohydrate reduction. She is incorporating exercise in to her weekly routine. She has lost 5 lbs since our last encounter.  Current Barriers:  Marland Kitchen Knowledge deficit related to how to loose weight  Nurse Case Manager Clinical Goal(s):   Over the next 14 days, patient will consider referral to nutritionist/weight loss clinic-goals met  11/10/2018  Over the next 30 days, patient will continue to reduce caloric intake and increase daily activity to facilitate continued weight loss.   Interventions:  . Provided emotional support and reassurance . Reinforced how excess weight contributes to chronic diseases . Reviewed options for weight loss including referral to [facility] nutrition- patient declined . Assessed for scales in the home . Provided written information on healthy weight loss stratagies  Patient Self Care Activities:  . Take medications as prescribed . Follow all action   *initial goal documentation       Telephone follow up appointment with CCM team member scheduled for: 30 days     Amber Caples E. Rollene Rotunda, RN, BSN Nurse Care Coordinator [facility] Care Management  [phone]

## 2018-11-23 ENCOUNTER — Telehealth: Payer: Self-pay | Admitting: Cardiovascular Disease

## 2018-11-23 ENCOUNTER — Ambulatory Visit: Payer: Self-pay | Admitting: Pharmacist

## 2018-11-23 DIAGNOSIS — Z598 Other problems related to housing and economic circumstances: Secondary | ICD-10-CM

## 2018-11-23 DIAGNOSIS — I4891 Unspecified atrial fibrillation: Secondary | ICD-10-CM

## 2018-11-23 DIAGNOSIS — Z599 Problem related to housing and economic circumstances, unspecified: Secondary | ICD-10-CM

## 2018-11-23 NOTE — Chronic Care Management (AMB) (Signed)
  Chronic Care Management   Note  11/23/2018 Name: Amber Reyes MRN: [id number] DOB: 12-23-1945  Care coordination with Dr. Reino Bellis office regarding [place] medication assistance application (Eliquis) -including spouse's out of pocket prescription costs with application -$641 credit due to coronavirus -credits system  Goals Addressed            This Visit's Progress   . "Some of my medications are really expensive and I can't afford" (pt-stated)       Current Barriers:  . financial  Pharmacist Clinical Goal(s): Over the next 14 days, [name] will provide the necessary supplementary documents (proof of out of pocket prescription expenditure, proof of household income) needed for medication assistance applications to CCM pharmacist.  10/05/18: Patient states she will bring documents to [place] office within the next week.   Interventions: . CCM pharmacist will apply for medication assistance program for Trulicity and Humalog made by [employer]. o Application submitted on 10/10/18 o Goal partially completed: Trulicity approved 12/28/28 until 10/23/19 . Discuss different options for "maintenance" inhaler due to Advair cost.   . Dr. Reino Bellis office is pursuing medication assistance for Eliquis. o Denied: patient needs to spend approximately $800 OOP on rxs o Collaboration with Dr. Reino Bellis office to share information regarding Eliquis application  Patient Self Care Activities:  Marland Kitchen Gather necessary documents needed to apply for medication assistance  Please see past updates related to this goal by clicking on the "Past Updates" button in the selected goal          Follow up plan: Follow up with provider re: Eliquis app in the next 14 days  Ruben Reason, PharmD Clinical Pharmacist [place] [phone]

## 2018-11-23 NOTE — Telephone Encounter (Signed)
New Message   Almyra Free from [facility] calling for papers to be filled out for patient to get assistance with paying for Eliquis.

## 2018-11-23 NOTE — Telephone Encounter (Signed)
[place] for Almyra Free to call me back

## 2018-11-23 NOTE — Patient Instructions (Signed)
Goals Addressed            This Visit's Progress   . "Some of my medications are really expensive and I can't afford" (pt-stated)       Current Barriers:  . financial  Pharmacist Clinical Goal(s): Over the next 14 days, [name] will provide the necessary supplementary documents (proof of out of pocket prescription expenditure, proof of household income) needed for medication assistance applications to CCM pharmacist.  10/05/18: Patient states she will bring documents to [place] office within the next week.   Interventions: . CCM pharmacist will apply for medication assistance program for Trulicity and Humalog made by [employer]. o Application submitted on 10/10/18 o Goal partially completed: Trulicity approved 04/26/16 until 10/23/19 . Discuss different options for "maintenance" inhaler due to Advair cost.   . Dr. Reino Bellis office is pursuing medication assistance for Eliquis. o Denied: patient needs to spend approximately $800 OOP on rxs o Collaboration with Dr. Reino Bellis office to share information regarding Eliquis application  Patient Self Care Activities:  Marland Kitchen Gather necessary documents needed to apply for medication assistance  Please see past updates related to this goal by clicking on the "Past Updates" button in the selected goal          The patient verbalized understanding of instructions provided today and declined a print copy of patient instruction materials.

## 2018-11-23 NOTE — Telephone Encounter (Signed)
Ruben Reason, [facility] calling from [facility] She can't see patient assistance application for Eliquis in epic and initially wanted to speak with brittany slayton. Also wanted to relay info regarding med costs of pt's household and how to bring cost down.

## 2018-11-23 NOTE — Telephone Encounter (Signed)
Spoke with Almyra Free.  She just wanted to reach out and see if there was anything that she could help with with maybe getting the cost of patients medication lowered. I told her I was currently working from home and that tomorrow when I was in the office I would get the application and see if there is anything that the patients assistance program offers to help with this so patient does not have to switch to warfarin

## 2018-11-23 NOTE — Telephone Encounter (Signed)
You'll have to send this to Dr. Tyrell Antonio nurse.  We only do assistance for the cholesterol meds

## 2018-12-07 ENCOUNTER — Ambulatory Visit (INDEPENDENT_AMBULATORY_CARE_PROVIDER_SITE_OTHER): Payer: Medicare PPO | Admitting: Pharmacist

## 2018-12-07 ENCOUNTER — Other Ambulatory Visit: Payer: Self-pay

## 2018-12-07 DIAGNOSIS — E1129 Type 2 diabetes mellitus with other diabetic kidney complication: Secondary | ICD-10-CM

## 2018-12-07 DIAGNOSIS — R809 Proteinuria, unspecified: Secondary | ICD-10-CM | POA: Diagnosis not present

## 2018-12-07 DIAGNOSIS — Z599 Problem related to housing and economic circumstances, unspecified: Secondary | ICD-10-CM

## 2018-12-07 DIAGNOSIS — Z794 Long term (current) use of insulin: Secondary | ICD-10-CM | POA: Diagnosis not present

## 2018-12-07 DIAGNOSIS — I4891 Unspecified atrial fibrillation: Secondary | ICD-10-CM

## 2018-12-07 DIAGNOSIS — Z598 Other problems related to housing and economic circumstances: Secondary | ICD-10-CM

## 2018-12-07 NOTE — Chronic Care Management (AMB) (Signed)
  Chronic Care Management   Follow Up Note   12/07/2018 Name: [name] MRN: [id number] DOB: 1946/07/06  Referred by: Steele Sizer, MD Reason for referral : Chronic Care Management (medication assistance)   Amber Reyes is a 73 y.o. year old female who is a primary care patient of Steele Sizer, MD. The CCM team was consulted for assistance with chronic disease management and care coordination needs.    Review of patient status, including review of consultants reports, relevant laboratory and other test results, and collaboration with appropriate care team members and the patient's provider was performed as part of comprehensive patient evaluation and provision of chronic care management services.    Goals Addressed            This Visit's Progress   . COMPLETED: "Some of my medications are really expensive and I can't afford" (pt-stated)       Current Barriers:  . financial  Pharmacist Clinical Goal(s): Over the next 14 days, Amber Reyes will provide the necessary supplementary documents (proof of out of pocket prescription expenditure, proof of household income) needed for medication assistance applications to CCM pharmacist.  10/05/18: Patient states she will bring documents to [place] office within the next week.   Interventions: . CCM pharmacist will apply for medication assistance program for Trulicity and Humalog made by [employer]. o Application submitted on 10/10/18 o Goal partially completed: Trulicity approved 04/26/49 until 10/23/19 . Discuss different options for "maintenance" inhaler due to Advair cost.   . Dr. Reino Bellis office is pursuing medication assistance for Eliquis. o Denied: patient needs to spend approximately $800 OOP on rxs o Collaboration with Dr. Reino Bellis office to share information regarding Eliquis application  Patient Self Care Activities:  Marland Kitchen Gather necessary documents needed to apply for medication assistance  Please see past updates  related to this goal by clicking on the "Past Updates" button in the selected goal           The patient has been provided with contact information for the chronic care management team and has been advised to call with any health related questions or concerns.   Ruben Reason, PharmD Clinical Pharmacist [facility] [phone]

## 2018-12-07 NOTE — Patient Instructions (Signed)
Goals Addressed            This Visit's Progress   . COMPLETED: "Some of my medications are really expensive and I can't afford" (pt-stated)       Current Barriers:  . financial  Pharmacist Clinical Goal(s): Over the next 14 days, [name] will provide the necessary supplementary documents (proof of out of pocket prescription expenditure, proof of household income) needed for medication assistance applications to CCM pharmacist.  10/05/18: Patient states she will bring documents to [place] office within the next week.   Interventions: . CCM pharmacist will apply for medication assistance program for Trulicity and Humalog made by [employer]. o Application submitted on 10/10/18 o Goal partially completed: Trulicity approved 04/28/77 until 10/23/19 . Discuss different options for "maintenance" inhaler due to Advair cost.   . Dr. Reino Bellis office is pursuing medication assistance for Eliquis. o Denied: patient needs to spend approximately $800 OOP on rxs o Collaboration with Dr. Reino Bellis office to share information regarding Eliquis application  Patient Self Care Activities:  Marland Kitchen Gather necessary documents needed to apply for medication assistance  Please see past updates related to this goal by clicking on the "Past Updates" button in the selected goal          .For information about COVID-19 or "Corona Virus", the following web resources may be helpful:  CDC: BeginnerSteps.be    [place]:  InsuranceIntern.se   Thank you allowing the Chronic Care Management Team to be a part of your care!   Please call a member of the CCM (Chronic Care Management) Team with any questions or case management needs:   Vanetta Mulders, BSN Nurse Care Coordinator  [phone]  Ruben Reason, PharmD  Clinical Pharmacist  [phone]  Elliot Gurney,  [place] Clinical Social Worker [phone]

## 2018-12-12 ENCOUNTER — Ambulatory Visit: Payer: Self-pay

## 2018-12-12 ENCOUNTER — Other Ambulatory Visit: Payer: Self-pay

## 2018-12-12 DIAGNOSIS — Z599 Problem related to housing and economic circumstances, unspecified: Secondary | ICD-10-CM

## 2018-12-12 DIAGNOSIS — I272 Pulmonary hypertension, unspecified: Secondary | ICD-10-CM

## 2018-12-12 DIAGNOSIS — Z794 Long term (current) use of insulin: Principal | ICD-10-CM

## 2018-12-12 DIAGNOSIS — Z598 Other problems related to housing and economic circumstances: Secondary | ICD-10-CM

## 2018-12-12 DIAGNOSIS — R809 Proteinuria, unspecified: Principal | ICD-10-CM

## 2018-12-12 DIAGNOSIS — E1129 Type 2 diabetes mellitus with other diabetic kidney complication: Secondary | ICD-10-CM

## 2018-12-12 DIAGNOSIS — I110 Hypertensive heart disease with heart failure: Secondary | ICD-10-CM

## 2018-12-12 DIAGNOSIS — J42 Unspecified chronic bronchitis: Secondary | ICD-10-CM

## 2018-12-12 NOTE — Chronic Care Management (AMB) (Signed)
Chronic Care Management   Follow Up Note   12/12/2018 Name: Amber Reyes MRN: [id number] DOB: 05/08/1946  Referred by: Steele Sizer, MD Reason for referral : No chief complaint on file.   Subjective: "I am doing really good except for the pollen"   Objective:  Lab Results  Component Value Date   HGBA1C 6.6 (A) 08/30/2018   HGBA1C 6.6 08/30/2018   HGBA1C 6.6 (A) 08/30/2018   HGBA1C 6.6 08/30/2018   BP Readings from Last 3 Encounters:  10/17/18 (!) 160/70  09/25/18 123/60  08/30/18 (!) 152/60    Assessment: Amber Reyes a 73year old female who sees Dr. Laure Kidney primary care.Dr. Lenox Ahr the CCM team to consult the patient for assistance with chronic disease management related toHTN, Pulmonary HTN, DM, Hyperlipidemia, and CKD. Patient also needs assistance with medication afordibility. Referral was placed1/03/2019.Amber Reyes agreed to [facility] and met with the CCM Team1/30/2020to establishes health goals. Today the [facility] CM followed up with Amber Reyes via telephone to follow progression towards goals and to address any question, concerns, or educational needs related to COVID-19.  Review of patient status, including review of consultants reports, relevant laboratory and other test results, and collaboration with appropriate care team members and the patient's provider was performed as part of comprehensive patient evaluation and provision of chronic care management services.    Goals Addressed            This Visit's Progress   . "I don't weigh myself every day. Do I need to?" (pt-stated)       Amber Reyes is doing well with her overall health. Her diabetes is very well controlled with the numbers she reports. States her daily fasting sugars are between 80 and 110. She is monitoring her CHF symptoms daily and checking her BP as discussed. Reports systolic pressures between 149-702 and diastolic between 63-78, however she has not obtained  a scale. She utilizes [employer] but did not see that they had a digital scale. CCM RN CM reviewed catalogue and found digital scale for 24.00 and she is allotted 30.00 per quarter. Amber Reyes understands the CHF action plan and to notify her cardiologist when she is in the yellow zone. To complete her ability to completely monitor her symptoms, she will order a digital scale during the next quarter from [place]. As for weight loss, that goal was not addressed today as patient has not been able to exercise and follow her weight loss plan of care. She is monitoring her caloric intake but unable to walk outdoors related to the high pollen levels at this time. She continues to follow COVID-19 stay at home order and infection preventions strategies.   Current Barriers:  Marland Kitchen Knowledge deficit related to basic heart failure pathophysiology and self care management . Lack of scale in home   Nurse Case Manager Clinical Goal(s):   Over the next 60 days, patient will order digital scales from [facility] OTC benefit booklet  Over the next 30 days, patient will verbalize understanding of Heart Failure Action Plan and when to call doctor (symptoms other than weight gain)  Over the next 30 days, patient will take all Heart Failure mediations as prescribed  Interventions:  . Assessed for HF plan of care knowledge and adherence . Reinforced importance of adherence to low sodium diet . Reviewed Heart Failure Action Plan . Assessed for scales in home . Discussed importance of daily weight . Reviewed role of diuretics in prevention of  fluid overload  Patient Self Care Activities:  . Take Heart Failure Medications as prescribed . Order digital scales from [place] . Weigh daily and record (notify MD with 3 lb weight gain over night or 5 lb in a week) . Follow CHF Action Plan . Adhere to low sodium diet  Please see past updates related to this goal by clicking on the "Past  Updates" button in the selected goal       Telephone follow up appointment with CCM team member scheduled for: 30 days     Jorden Minchey E. Rollene Rotunda, RN, BSN Nurse Care Coordinator [facility] Care Management  [phone]

## 2018-12-12 NOTE — Patient Instructions (Signed)
  Thank you allowing the Chronic Care Management Team to be a part of your care! It was a pleasure speaking with you today!  1. Please continue to check your blood sugars as your are doing. The numbers you report are fantastic!! Keep up the good work. 2. Continue to check your BP a couple of times a week. Please report consistent numbers of 150 or greater to Dr. Ancil Boozer  3. Please consider ordering a digital scale from [facility] It is very important to weigh daily as you manage your heart failure. 4. Assess your HF symptoms daily and report any symptoms in the yellow zone to Dr. Fletcher Anon. 5. Take all medications as prescribed and DO NOT quit taking your Eliquis. I am happy your family chipped in to buy your 3 month supply.  CCM (Chronic Care Management) Team   Trish Fountain RN, BSN Nurse Care Coordinator  [phone]  Ruben Reason PharmD  Clinical Pharmacist  [phone]   Elliot Gurney, LCSW Clinical Social Worker [phone]  Goals Addressed            This Visit's Progress   . "I don't weigh myself every day. Do I need to?" (pt-stated)       Current Barriers:  Marland Kitchen Knowledge deficit related to basic heart failure pathophysiology and self care management . Lack of scale in home   Nurse Case Manager Clinical Goal(s):   Over the next 60 days, patient will order digital scales from [facility] OTC benefit booklet  Over the next 30 days, patient will verbalize understanding of Heart Failure Action Plan and when to call doctor (symptoms other than weight gain)  Over the next 30 days, patient will take all Heart Failure mediations as prescribed  Interventions:  . Assessed for HF plan of care knowledge and adherence . Reinforced importance of adherence to low sodium diet . Reviewed Heart Failure Action Plan . Assessed for scales in home . Discussed importance of daily weight . Reviewed role of diuretics in prevention of fluid overload  Patient Self Care Activities:   . Take Heart Failure Medications as prescribed . Order digital scales from [place] . Weigh daily and record (notify MD with 3 lb weight gain over night or 5 lb in a week) . Follow CHF Action Plan . Adhere to low sodium diet  Please see past updates related to this goal by clicking on the "Past Updates" button in the selected goal     . "I really want to loose this weight" (pt-stated)       Current Barriers:  Marland Kitchen Knowledge deficit related to how to loose weight   Nurse Case Manager Clinical Goal(s):   Over the next 30 days, patient will continue to reduce caloric intake and increase daily activity to facilitate continued weight loss.   Interventions:  . Provided emotional support and reassurance . Reinforced how excess weight contributes to chronic diseases . Reviewed options for weight loss including referral to [facility] nutrition- patient declined . Assessed for scales in the home . Provided written information on healthy weight loss stratagies  Patient Self Care Activities:  . Take medications as prescribed . Follow all action   *initial goal documentation        The patient verbalized understanding of instructions provided today and declined a print copy of patient instruction materials.   Telephone follow up appointment with CCM team member scheduled for: 30 days

## 2018-12-29 DIAGNOSIS — G4733 Obstructive sleep apnea (adult) (pediatric): Secondary | ICD-10-CM | POA: Diagnosis not present

## 2019-01-01 ENCOUNTER — Ambulatory Visit (INDEPENDENT_AMBULATORY_CARE_PROVIDER_SITE_OTHER): Payer: Medicare PPO | Admitting: Family Medicine

## 2019-01-01 ENCOUNTER — Encounter: Payer: Self-pay | Admitting: Family Medicine

## 2019-01-01 ENCOUNTER — Other Ambulatory Visit: Payer: Self-pay

## 2019-01-01 VITALS — BP 142/68 | HR 65 | Temp 97.7°F | Resp 16

## 2019-01-01 DIAGNOSIS — E1122 Type 2 diabetes mellitus with diabetic chronic kidney disease: Secondary | ICD-10-CM

## 2019-01-01 DIAGNOSIS — E1129 Type 2 diabetes mellitus with other diabetic kidney complication: Secondary | ICD-10-CM

## 2019-01-01 DIAGNOSIS — I10 Essential (primary) hypertension: Secondary | ICD-10-CM

## 2019-01-01 DIAGNOSIS — I272 Pulmonary hypertension, unspecified: Secondary | ICD-10-CM

## 2019-01-01 DIAGNOSIS — E1169 Type 2 diabetes mellitus with other specified complication: Secondary | ICD-10-CM | POA: Diagnosis not present

## 2019-01-01 DIAGNOSIS — N183 Chronic kidney disease, stage 3 unspecified: Secondary | ICD-10-CM

## 2019-01-01 DIAGNOSIS — G4733 Obstructive sleep apnea (adult) (pediatric): Secondary | ICD-10-CM

## 2019-01-01 DIAGNOSIS — M109 Gout, unspecified: Secondary | ICD-10-CM

## 2019-01-01 DIAGNOSIS — Z794 Long term (current) use of insulin: Secondary | ICD-10-CM | POA: Diagnosis not present

## 2019-01-01 DIAGNOSIS — I4891 Unspecified atrial fibrillation: Secondary | ICD-10-CM

## 2019-01-01 DIAGNOSIS — I110 Hypertensive heart disease with heart failure: Secondary | ICD-10-CM | POA: Diagnosis not present

## 2019-01-01 DIAGNOSIS — R809 Proteinuria, unspecified: Secondary | ICD-10-CM

## 2019-01-01 DIAGNOSIS — I700 Atherosclerosis of aorta: Secondary | ICD-10-CM

## 2019-01-01 DIAGNOSIS — J4540 Moderate persistent asthma, uncomplicated: Secondary | ICD-10-CM

## 2019-01-01 DIAGNOSIS — E785 Hyperlipidemia, unspecified: Secondary | ICD-10-CM

## 2019-01-01 LAB — POCT GLYCOSYLATED HEMOGLOBIN (HGB A1C): HbA1c, POC (controlled diabetic range): 6.5 % (ref 0.0–7.0)

## 2019-01-01 MED ORDER — IRBESARTAN 300 MG PO TABS: 300.0000 mg | ORAL_TABLET | Freq: Every day | ORAL | 1 refills | Status: DC

## 2019-01-01 MED ORDER — FLUTICASONE-SALMETEROL 250-50 MCG/DOSE IN AEPB: 1.0000 | INHALATION_SPRAY | Freq: Two times a day (BID) | RESPIRATORY_TRACT | 1 refills | Status: DC

## 2019-01-01 MED ORDER — PRAVASTATIN SODIUM 80 MG PO TABS: 80.0000 mg | ORAL_TABLET | Freq: Every evening | ORAL | 1 refills | Status: DC

## 2019-01-01 MED ORDER — FUROSEMIDE 40 MG PO TABS: 40.0000 mg | ORAL_TABLET | Freq: Two times a day (BID) | ORAL | 1 refills | Status: DC | PRN

## 2019-01-01 MED ORDER — DILTIAZEM HCL ER COATED BEADS 300 MG PO CP24: 300.0000 mg | ORAL_CAPSULE | Freq: Every day | ORAL | 1 refills | Status: DC

## 2019-01-01 NOTE — Progress Notes (Signed)
Name: Amber Reyes   MRN: [id number]    DOB: 26-Aug-1945   Date:01/01/2019       Progress Note  Subjective  Chief Complaint  Chief Complaint  Patient presents with  . Diabetes    patient stated she has been having low BS and has not taken night insulin dosage  . Follow-up    I connected with  Amber Reyes  on 01/01/19 at  7:40 AM EDT by a video enabled telemedicine application and verified that I am speaking with the correct person using two identifiers.  I discussed the limitations of evaluation and management by telemedicine and the availability of in person appointments. The patient expressed understanding and agreed to proceed. Staff also discussed with the patient that there may be a patient responsible charge related to this service. Patient Location: inside her car in our parking lot Provider Location: [facility] Additional Individuals present: husband  HPI  DMII controlled: She has been compliant with medication, she was on Humalog 75/25 60 units in am and 30 at night since Feb 2018, also started on Trulicity in 3846 and [id number] been at [facility] today at 6.5% Taking Avapro for nephropathy, still has some mild microalbuminuria, last level 45. No polyphagia, polydipsia or polyuria.She is now getting Trulicity through the assistance program . She states has noticed some episodes of hypoglycemia during the night when she does not eat enough during the day, she skipped dose of insulin last nigh and glucose this morning was 106 this morning, fasting in the 80's-90's, we have been titrated her insulin down and she is currently taking 55 units pm and 25 units am, today we will change to 50 units in am and 20 units at night, advised her to keep checking her glucose and let me know in one month , we may need to go down even more   HTN/CHF: taking medications and denies side effects of medications, bp is at goal, she denies chest painno recentpalpitation,  found to have Afib on EKG done by Dr. Fletcher Anon - she was not wearing CPAP consistently at the time  She has orthopnea, SOB is stable, and edema has been stable.  Echo done 06/2018 LV function is normal and unable to assess diastolic dysfunction, severe left  Atrium dilation , mild elevation of Pulmonary pressure, She is currently on Eliquis for Afib and is trying to get assistance with cost of medication also   Hyperlipidemia: taking Pravastatin, we will continue current medication,reviewed labs done 11/2017, LDL at goal.No myalgia . She is due for repeat labs but because of COVID-19 we will hold off until her next visit   RLS: symptoms are still present, described as soreness on both legs at night, improves when she rubs her legs. She was having cramps after taking lasix, but is currently taking lasix every other day and has been doing better. Unchanged  CKIII: [facility], last labs reviewed still stage III CKI with mild drop of GFR , no itching , good urine output. Unchanged  OSA: she has been wearing CPAP machineevery night now, better compliance sinceshe got machine adjusted by Dr. Felicie Morn. Unchanged  Morbid Obesity: unable to check her weight today, hopefully it will help her weight loss with decrease in insulin and better diet. Recheck on her next visit when she can come inside out office  Asthma Moderate persistent/Chronic bronchitis: one flare back in 09/2016. She is compliant and uses Advair twice daily as prescribed,  no side effects.. No wheezing or SOBat this time, she states worse during the Summer with humidity and pollen. She is now seeing Dr. Felicie Morn  Afib : on Cardizem CD. Eliquis and off aspirin . She was given Eliquis back in 2015 but did not start because of cost, but is on samples now and trying to get forms filled out at Dr. Tyrell Antonio office to be able to afford medication. .Seen by Dr. Fletcher Anon in 2015 and again in 2017 after admission  for CHF. Last Echo was done 08/04/2018 60-65%, EKG 06/2018 showed afib, compliant now but trying to get assistance to offset cost of medication $400 copay   Atherosclerosis of aorta and echo showed mild dilation of ascending aorta, continue statin and eliquis. Unchanged   Patient Active Problem List   Diagnosis Date Noted  . Chronic bronchitis ([place]) 08/30/2018  . Thoracic aortic atherosclerosis ([place]) 12/30/2016  . Controlled type 2 diabetes mellitus with stage 3 chronic kidney disease ([place]) 12/23/2016  . Venous insufficiency 05/21/2016  . Hypertensive heart disease 05/21/2016  . Anemia in chronic kidney disease 12/22/2015  . Pulmonary hypertension ([place]) 12/18/2015  . 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 Anxiety 04/12/2015  . Chronic kidney disease (CKD), stage III (moderate) ([place]) 04/12/2015  . Edema extremities 04/12/2015  . 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 Asthma, moderate persistent 04/12/2015  . Morbid obesity, unspecified obesity type ([place]) 04/12/2015  . Obstructive apnea 04/12/2015  . Restless leg 04/12/2015  . Allergic rhinitis 04/12/2015  . Vitamin D deficiency 04/12/2015  . Controlled gout 04/12/2015  . Premature atrial contractions 11/29/2013  . Atrial fibrillation ([place]) 11/09/2013  . Benign essential hypertension   . Hyperlipidemia     Past Surgical History:  Procedure Laterality Date  . CARDIAC CATHETERIZATION  2002   [facility]  . COLONOSCOPY WITH PROPOFOL N/A 09/25/2018   Procedure: COLONOSCOPY WITH PROPOFOL;  Surgeon: Jonathon Bellows, MD;  Location: [facility] ENDOSCOPY;  Service: Gastroenterology;  Laterality: N/A;  . PERICARDIUM SURGERY      Family History  Problem Relation Age of Onset  . Heart attack Mother   . Hypertension Mother   . Breast cancer Neg Hx     Social History   Socioeconomic History  . Marital status: Married    Spouse name: Not on file  . Number of children: 3  . Years of education: Not on file  . Highest education level: Associate degree:  academic program  Occupational History  . Occupation: retired  Scientific laboratory technician  . Financial resource strain: Not hard at all  . Food insecurity:    Worry: Never true    Inability: Never true  . Transportation needs:    Medical: No    Non-medical: No  Tobacco Use  . Smoking status: Never Smoker  . Smokeless tobacco: Never Used  . Tobacco comment: n/a  Substance and Sexual Activity  . Alcohol use: No    Alcohol/week: 0.0 standard drinks  . Drug use: No  . Sexual activity: Not Currently  Lifestyle  . Physical activity:    Days per week: 0 days    Minutes per session: Not on file  . Stress: Only a little  Relationships  . Social connections:    Talks on phone: More than three times a week    Gets together: Once a week    Attends religious service: More than 4 times per year    Active member of club or organization: Yes    Attends meetings of clubs or organizations: More  than 4 times per year    Relationship status: Married  . Intimate partner violence:    Fear of current or ex partner: No    Emotionally abused: No    Physically abused: No    Forced sexual activity: No  Other Topics Concern  . Not on file  Social History Narrative  . Not on file     Current Outpatient Medications:  .  albuterol (PROVENTIL HFA;VENTOLIN HFA) 108 (90 BASE) MCG/ACT inhaler, Inhale 2 puffs into the lungs every 6 (six) hours as needed for wheezing or shortness of breath., Disp: , Rfl:  .  apixaban (ELIQUIS) 5 MG TABS tablet, Take 1 tablet (5 mg total) by mouth 2 (two) times daily., Disp: 180 tablet, Rfl: 3 .  Blood Glucose Calibration (TRUE METRIX LEVEL 1) Low SOLN, 1 each by In Vitro route once a week., Disp: 1 each, Rfl: 1 .  Blood Glucose Monitoring Suppl (TRUE METRIX AIR GLUCOSE METER) w/Device KIT, Inject 1 each into the skin 4 (four) times daily as needed. Check fsbs 4 times daily E11.22, Disp: 1 kit, Rfl: 0 .  Cholecalciferol (VITAMIN D) 2000 UNITS tablet, Take 2,000 Units by mouth daily.,  Disp: , Rfl:  .  colchicine (COLCRYS) 0.6 MG tablet, Take 1-2 tablets (0.6-1.2 mg total) by mouth daily as needed., Disp: 30 tablet, Rfl: 0 .  diltiazem (CARDIZEM CD) 300 MG 24 hr capsule, Take 1 capsule (300 mg total) by mouth daily., Disp: 90 capsule, Rfl: 1 .  Dulaglutide (TRULICITY) 1.5 LY/6.5KP SOPN, Inject 1.5 mg into the skin once a week., Disp: 6 mL, Rfl: 1 .  Elastic Bandages & Supports (MEDICAL COMPRESSION STOCKINGS) MISC, 2 each by Does not apply route daily., Disp: 2 each, Rfl: 2 .  fluconazole (DIFLUCAN) 150 MG tablet, TAKE ONE TABLET EVERY OTHER DAY AS NEEDED FOR  YEAST  INFECTION, Disp: 4 tablet, Rfl: 1 .  fluticasone (FLONASE) 50 MCG/ACT nasal spray, USE 2 SPRAYS IN EACH NOSTRIL EVERY DAY, Disp: 48 g, Rfl: 2 .  Fluticasone-Salmeterol (ADVAIR DISKUS) 250-50 MCG/DOSE AEPB, Inhale 1 puff into the lungs 2 (two) times daily., Disp: 3 each, Rfl: 1 .  furosemide (LASIX) 40 MG tablet, Take 1 tablet (40 mg total) by mouth 2 (two) times daily as needed., Disp: 180 tablet, Rfl: 1 .  Insulin Lispro Prot & Lispro (HUMALOG MIX 75/25 KWIKPEN) (75-25) 100 UNIT/ML Kwikpen, Inject 25-55 Units into the skin 2 (two) times daily., Disp: 15 pen, Rfl: 1 .  Insulin Pen Needle (NOVOFINE) 30G X 8 MM MISC, Inject 10 each into the skin daily., Disp: 100 each, Rfl: 1 .  irbesartan (AVAPRO) 300 MG tablet, Take 1 tablet (300 mg total) by mouth daily., Disp: 90 tablet, Rfl: 1 .  pravastatin (PRAVACHOL) 80 MG tablet, Take 1 tablet (80 mg total) by mouth every evening., Disp: 90 tablet, Rfl: 1 .  TRUE METRIX BLOOD GLUCOSE TEST test strip, USE  AS  INSTRUCTED, Disp: 300 each, Rfl: 1 .  TRUEPLUS LANCETS 33G MISC, 1 each by Does not apply route 3 (three) times daily., Disp: 300 each, Rfl: 2 .  allopurinol (ZYLOPRIM) 100 MG tablet, Take 1 tablet (100 mg total) by mouth 2 (two) times daily., Disp: 180 tablet, Rfl: 1  No Known Allergies  I personally reviewed active problem list, medication list, allergies, family  history, social history with the patient/caregiver today.   ROS  Ten systems reviewed and is negative except as mentioned in HPI   Objective  Virtual encounter,  vitals not obtained.  There is no height or weight on file to calculate BMI.  Physical Exam  Awake, alert and oriented, and in no distress   PHQ2/9: Depression screen [facility] 2/9 01/01/2019 04/25/2018 08/24/2017 03/25/2017 12/30/2016  Decreased Interest 0 0 0 0 0  Down, Depressed, Hopeless 0 0 0 0 0  PHQ - 2 Score 0 0 0 0 0  Altered sleeping 0 - - - -  Tired, decreased energy 0 - - - -  Change in appetite 0 - - - -  Feeling bad or failure about yourself  0 - - - -  Trouble concentrating 0 - - - -  Moving slowly or fidgety/restless 0 - - - -  Suicidal thoughts 0 - - - -  PHQ-9 Score 0 - - - -  Difficult doing work/chores Not difficult at all - - - -   PHQ-2/9 Result is negative.    Fall Risk: Fall Risk  01/01/2019 08/30/2018 11/25/2017 08/24/2017 07/27/2017  Falls in the past year? 0 0 No No No  Number falls in past yr: 0 0 - - -  Injury with Fall? 0 0 - - -  Follow up Falls evaluation completed - - - -     Assessment & Plan  1. Controlled type 2 diabetes mellitus with microalbuminuria, with long-term current use of insulin (HCC)  - POCT HgB A1C  2. Hypertensive heart disease with heart failure (HCC)  - furosemide (LASIX) 40 MG tablet; Take 1 tablet (40 mg total) by mouth 2 (two) times daily as needed.  Dispense: 180 tablet; Refill: 1  3. Pulmonary hypertension ([place])   4. Atrial fibrillation, unspecified type ([place])  On Eliquis   5. Benign essential hypertension  - irbesartan (AVAPRO) 300 MG tablet; Take 1 tablet (300 mg total) by mouth daily.  Dispense: 90 tablet; Refill: 1  6. Morbid obesity ([place])  Discussed with the patient the risk posed by an increased BMI. Discussed importance of portion control, calorie counting and at least 150 minutes of physical activity weekly. Avoid sweet beverages and drink more water.  Eat at least 6 servings of fruit and vegetables daily   7. Controlled type 2 diabetes mellitus with stage 3 chronic kidney disease, with long-term current use of insulin ([place])   8. Obstructive apnea   9. Dyslipidemia associated with type 2 diabetes mellitus (HCC)  - pravastatin (PRAVACHOL) 80 MG tablet; Take 1 tablet (80 mg total) by mouth every evening.  Dispense: 90 tablet; Refill: 1  10. Moderate persistent asthma without complication  - Fluticasone-Salmeterol (ADVAIR DISKUS) 250-50 MCG/DOSE AEPB; Inhale 1 puff into the lungs 2 (two) times daily.  Dispense: 3 each; Refill: 1  11. Controlled gout   12. Thoracic aortic atherosclerosis (HCC)  Continue medications  I discussed the assessment and treatment plan with the patient. The patient was provided an opportunity to ask questions and all were answered. The patient agreed with the plan and demonstrated an understanding of the instructions.  The patient was advised to call back or seek an in-person evaluation if the symptoms worsen or if the condition fails to improve as anticipated.  I provided 25 minutes of non-face-to-face time during this encounter.

## 2019-01-03 ENCOUNTER — Other Ambulatory Visit: Payer: Self-pay | Admitting: Family Medicine

## 2019-01-03 DIAGNOSIS — M109 Gout, unspecified: Secondary | ICD-10-CM

## 2019-01-09 ENCOUNTER — Ambulatory Visit (INDEPENDENT_AMBULATORY_CARE_PROVIDER_SITE_OTHER): Payer: Medicare PPO

## 2019-01-09 ENCOUNTER — Other Ambulatory Visit: Payer: Self-pay

## 2019-01-09 DIAGNOSIS — R809 Proteinuria, unspecified: Secondary | ICD-10-CM

## 2019-01-09 DIAGNOSIS — I110 Hypertensive heart disease with heart failure: Secondary | ICD-10-CM

## 2019-01-09 DIAGNOSIS — I272 Pulmonary hypertension, unspecified: Secondary | ICD-10-CM

## 2019-01-09 DIAGNOSIS — Z794 Long term (current) use of insulin: Secondary | ICD-10-CM

## 2019-01-09 DIAGNOSIS — E1129 Type 2 diabetes mellitus with other diabetic kidney complication: Secondary | ICD-10-CM | POA: Diagnosis not present

## 2019-01-09 NOTE — Chronic Care Management (AMB) (Signed)
Chronic Care Management   Follow Up Note   01/09/2019 Name: Amber Reyes MRN: [id number] DOB: 07/05/1946  Referred by: Steele Sizer, MD Reason for referral : Chronic Care Management (follow up weight loss/DM)   Subjective: " I just had my blood work and my A1C was 6.5" "I think I am doing good managing my diet and health"    Objective:  BP Readings from Last 3 Encounters:  01/01/19 (!) 142/68  10/17/18 (!) 160/70  09/25/18 123/60   Assessment: Amber Reyes a 73year old female who sees Dr. Laure Kidney primary care.Dr. Lenox Ahr the CCM team to consult the patient for assistance with chronic disease management related toHTN, Pulmonary HTN, DM, Hyperlipidemia, and CKD. Patient also needs assistance with medication afordibility. Referral was placed1/03/2019.Amber Reyes agreed to [facility] and met with the CCM Team1/30/2020to establishes health goals. Today the [facility] CM followed up with [name] via telephone to follow progression towards goalsand to address any question, concerns, or educational needs related to COVID-19.  Review of patient status, including review of consultants reports, relevant laboratory and other test results, and collaboration with appropriate care team members and the patient's provider was performed as part of comprehensive patient evaluation and provision of chronic care management services.    Goals Addressed            This Visit's Progress   . COMPLETED: "I don't weigh myself every day. Do I need to?" (pt-stated)       Amber Reyes has not picked up a scale to date. She attempted to order from [facility], but because she has a PPO plan and not HMO, she is only allowed 30.00/quarter. Scales cost 40.00. She is utilizing other symptoms in the CHF action plan yellow zone to guide her symptom reporting. She will look for scales at box store like [employer] or [employer] once covid 19 restrictions are lifted.   Current  Barriers:  Marland Kitchen Knowledge deficit related to basic heart failure pathophysiology and self care management . Lack of scale in home   Nurse Case Manager Clinical Goal(s):   Over the next 60 days, patient will order digital scales from [facility] OTC benefit booklet  Over the next 30 days, patient will verbalize understanding of Heart Failure Action Plan and when to call doctor (symptoms other than weight gain)  Over the next 30 days, patient will take all Heart Failure mediations as prescribed  Interventions:  . Assessed for HF plan of care knowledge and adherence . Reinforced importance of adherence to low sodium diet . Reviewed Heart Failure Action Plan . Assessed for scales in home . Discussed importance of daily weight . Reviewed role of diuretics in prevention of fluid overload  Patient Self Care Activities:  . Take Heart Failure Medications as prescribed . Order digital scales from [place] . Weigh daily and record (notify MD with 3 lb weight gain over night or 5 lb in a week) . Follow CHF Action Plan . Adhere to low sodium diet  Please see past updates related to this goal by clicking on the "Past Updates" button in the selected goal     . COMPLETED: "I really want to loose this weight" (pt-stated)       Amber Reyes continues to do well managing her DM and overall health. She does not have a scale in her home but she assesses her weight by the way her clothes fit. Today she was offered referral to [facility] nutrition but declined  as she wants to try to loose weight on her own. She is trying to be active and walk daily, take her medications, limit portion sizes. This is reflective in her most recent A1C which was 6.5. She understands that if she decides she would like to attend a weight loss/nutrition class, RN CM will be happy to assist with referral.  Current Barriers:  Marland Kitchen Knowledge deficit related to how to loose weight   Nurse Case Manager Clinical Goal(s):    Over the next 30 days, patient will continue to reduce caloric intake and increase daily activity to facilitate continued weight loss.   Interventions:  . Provided emotional support and reassurance . Reinforced how excess weight contributes to chronic diseases . Reviewed options for weight loss including referral to [facility] nutrition- patient declined . Assessed for scales in the home . Provided written information on healthy weight loss stratagies  Patient Self Care Activities:  . Take medications as prescribed . Follow all action   *initial goal documentation         Amber Reyes has met all care management goals. Review of patient status, including review of consultants reports, relevant laboratory and other test results, and collaboration with appropriate care team members and the patient's provider was performed as part of comprehensive patient evaluation and provision of chronic care management services.  The care management team is available to Amber Reyes at any time to assist with care management needs should they arise. Ms. Round has been given contact information and instructions to contact the care management team with any questions or should new care management needs arise.    Tiaria Biby E. Rollene Rotunda, RN, BSN Nurse Care Coordinator [facility] / [facility] Care Management  [phone]

## 2019-01-09 NOTE — Patient Instructions (Signed)
Thank you allowing the Chronic Care Management Team to be a part of your care! It was a pleasure speaking with you today!  Congratulations! You have met all case management goals! You may call the case management team at any time should you have a question or if you have new case management needs. We are happy to help you! We will let your doctor know that you have met your goals.   CCM (Chronic Care Management) Team   Trish Fountain RN, BSN Nurse Care Coordinator  [phone]  Ruben Reason PharmD  Clinical Pharmacist  [phone]   Elliot Gurney, LCSW Clinical Social Worker [phone]  Goals Addressed            This Visit's Progress   . COMPLETED: "I don't weigh myself every day. Do I need to?" (pt-stated)       Current Barriers:  Marland Kitchen Knowledge deficit related to basic heart failure pathophysiology and self care management . Lack of scale in home   Nurse Case Manager Clinical Goal(s):   Over the next 60 days, patient will order digital scales from [facility] OTC benefit booklet  Over the next 30 days, patient will verbalize understanding of Heart Failure Action Plan and when to call doctor (symptoms other than weight gain)  Over the next 30 days, patient will take all Heart Failure mediations as prescribed  Interventions:  . Assessed for HF plan of care knowledge and adherence . Reinforced importance of adherence to low sodium diet . Reviewed Heart Failure Action Plan . Assessed for scales in home . Discussed importance of daily weight . Reviewed role of diuretics in prevention of fluid overload  Patient Self Care Activities:  . Take Heart Failure Medications as prescribed . Order digital scales from [facility] . Weigh daily and record (notify MD with 3 lb weight gain over night or 5 lb in a week) . Follow CHF Action Plan . Adhere to low sodium diet  Please see past updates related to this goal by clicking on the "Past Updates" button in  the selected goal     . COMPLETED: "I really want to loose this weight" (pt-stated)       Current Barriers:  Marland Kitchen Knowledge deficit related to how to loose weight   Nurse Case Manager Clinical Goal(s):   Over the next 30 days, patient will continue to reduce caloric intake and increase daily activity to facilitate continued weight loss.   Interventions:  . Provided emotional support and reassurance . Reinforced how excess weight contributes to chronic diseases . Reviewed options for weight loss including referral to [facility] nutrition- patient declined . Assessed for scales in the home . Provided written information on healthy weight loss stratagies  Patient Self Care Activities:  . Take medications as prescribed . Follow all action   *initial goal documentation        SYMPTOMS OF A STROKE   You have any symptoms of stroke. "BE FAST" is an easy way to remember the main warning signs: ? B - Balance. Signs are dizziness, sudden trouble walking, or loss of balance. ? E - Eyes. Signs are trouble seeing or a sudden change in how you see. ? F - Face. Signs are sudden weakness or loss of feeling of the face, or the face or eyelid drooping on one side. ? A - Arms. Signs are weakness or loss of feeling in an arm. This happens suddenly and usually on one side of the body. ?  S - Speech. Signs are sudden trouble speaking, slurred speech, or trouble understanding what people say. ? T - Time. Time to call emergency services. Write down what time symptoms started.  You have other signs of stroke, such as: ? A sudden, very bad headache with no known cause. ? Feeling sick to your stomach (nausea). ? Throwing up (vomiting). ? Jerky movements you cannot control (seizure).  SYMPTOMS OF A HEART ATTACK  What are the signs or symptoms? Symptoms of this condition include:  Chest pain. It may feel like: ? Crushing or squeezing. ? Tightness, pressure, fullness, or heaviness.  Pain in the arm,  neck, jaw, back, or upper body.  Shortness of breath.  Heartburn.  Indigestion.  Nausea.  Cold sweats.  Feeling tired.  Sudden lightheadedness.

## 2019-02-05 ENCOUNTER — Other Ambulatory Visit: Payer: Self-pay | Admitting: Family Medicine

## 2019-02-05 DIAGNOSIS — E1129 Type 2 diabetes mellitus with other diabetic kidney complication: Secondary | ICD-10-CM

## 2019-02-05 MED ORDER — TRULICITY 1.5 MG/0.5ML ~~LOC~~ SOAJ: 1.5000 mg | SUBCUTANEOUS | 3 refills | Status: DC

## 2019-02-09 ENCOUNTER — Other Ambulatory Visit: Payer: Self-pay | Admitting: Family Medicine

## 2019-02-09 MED ORDER — METFORMIN HCL ER 500 MG PO TB24: 1000.0000 mg | ORAL_TABLET | Freq: Every day | ORAL | 0 refills | Status: DC

## 2019-02-13 ENCOUNTER — Ambulatory Visit (INDEPENDENT_AMBULATORY_CARE_PROVIDER_SITE_OTHER): Payer: Medicare PPO | Admitting: Pharmacist

## 2019-02-13 DIAGNOSIS — Z794 Long term (current) use of insulin: Secondary | ICD-10-CM | POA: Diagnosis not present

## 2019-02-13 DIAGNOSIS — E1129 Type 2 diabetes mellitus with other diabetic kidney complication: Secondary | ICD-10-CM | POA: Diagnosis not present

## 2019-02-13 DIAGNOSIS — R809 Proteinuria, unspecified: Secondary | ICD-10-CM

## 2019-02-13 DIAGNOSIS — I4891 Unspecified atrial fibrillation: Secondary | ICD-10-CM | POA: Diagnosis not present

## 2019-02-13 DIAGNOSIS — I272 Pulmonary hypertension, unspecified: Secondary | ICD-10-CM

## 2019-02-13 NOTE — Chronic Care Management (AMB) (Signed)
  Chronic Care Management   Follow Up Note   02/13/2019 Name: BLESS LISENBY MRN: [id number] DOB: Nov 24, 1945   Subjective: Amber Reyes is a 73 y.o. year old female who is a primary care patient of Steele Sizer, MD. Lurene Shadow contacted pharmacist today stating she is now in the Medicare "donut hole" and needs additional assistnace with her medications, most urgently her Humalog 75/25 KwikPen mix.   Assessment: #Humalog: Can be added to [employer]. Pharmacist contacted [employer] [facility] [employer] (fulfilling pharmacy). Standard refill request form to be submitted with Humalog prescription. Filled out by Dr. Steele Sizer, faxed and will be uploaded under media tab.   #Eliquis: Previously submitted to BMS to assess how much money Mr and Mrs [place]ah[place] needed to spend to reach 3% of out of pocket to meet BMS requirements. Can update application with more recent statements from pharmacy or insurance claims.   #Advair: Shelaine states we got a denial previously. I will look back through the chart and see what was applied for in winter of 2020 and what other avenues we can try next.   Goals Addressed            This Visit's Progress   . I'm now in the doughnut hole (pt-stated)       Current Barriers:  . financial  Pharmacist Clinical Goal(s): Over the next 14 days, [name] will provide the necessary supplementary documents (proof of out of pocket prescription expenditure, proof of household income) needed for medication assistance applications to CCM pharmacist.   Interventions: . CCM pharmacist will apply for medication assistance program for Eliquis made by BMS and prescribed by Dr. Fletcher Anon; Advair made by [place] and prescribed by Dr. Ancil Boozer . Corrected Lilly Cares assistance to include Humalog 75/25 Kwikpens  Patient Self Care Activities:  Marland Kitchen Gather necessary documents needed to apply for medication assistance  Initial goal documentation          Telephone follow up appointment with care management team member scheduled for: 1 week with PharmD  Ruben Reason, PharmD Clinical Pharmacist [facility] [phone]

## 2019-02-26 ENCOUNTER — Telehealth: Payer: Self-pay | Admitting: Family Medicine

## 2019-02-26 NOTE — Telephone Encounter (Signed)
Had one sample available. Patient will be here soon to pick it up.

## 2019-02-26 NOTE — Telephone Encounter (Signed)
°  Relation to pt: self  Call back number: [phone]    Reason for call:    Patient inquiring if there any Insulin Lispro Prot & Lispro (HUMALOG MIX 75/25 KWIKPEN) (75-25) 100 UNIT/ML Kwikpen samples in the office, patient ran out yesterday and states she's been speaking "Almyra Free" from the practice and pharmacy advise patient is currently in the donut hole, please advise

## 2019-02-28 ENCOUNTER — Other Ambulatory Visit: Payer: Self-pay | Admitting: Family Medicine

## 2019-02-28 DIAGNOSIS — R809 Proteinuria, unspecified: Secondary | ICD-10-CM

## 2019-02-28 DIAGNOSIS — E1129 Type 2 diabetes mellitus with other diabetic kidney complication: Secondary | ICD-10-CM

## 2019-03-12 ENCOUNTER — Telehealth: Payer: Self-pay | Admitting: Gastroenterology

## 2019-03-12 NOTE — Telephone Encounter (Signed)
Patient is scheduled for a colonoscopy with Dr Vicente Males in August. She would like to know where to go & needs a covid test.

## 2019-03-13 ENCOUNTER — Other Ambulatory Visit: Payer: Self-pay

## 2019-03-13 ENCOUNTER — Other Ambulatory Visit: Payer: Self-pay | Admitting: Family Medicine

## 2019-03-13 DIAGNOSIS — E1129 Type 2 diabetes mellitus with other diabetic kidney complication: Secondary | ICD-10-CM

## 2019-03-13 DIAGNOSIS — R809 Proteinuria, unspecified: Secondary | ICD-10-CM

## 2019-03-13 MED ORDER — TRULICITY 1.5 MG/0.5ML ~~LOC~~ SOAJ: 1.5000 mg | SUBCUTANEOUS | 3 refills | Status: DC

## 2019-03-13 MED ORDER — NA SULFATE-K SULFATE-MG SULF 17.5-3.13-1.6 GM/177ML PO SOLN: 1.0000 | Freq: Once | ORAL | 0 refills | Status: AC

## 2019-03-13 NOTE — Telephone Encounter (Signed)
Patient has been advised to have her COVID test on 04/09/19 at [facility] on [facility] between the hours of 08:30 am and 12:30pm.  Thanks [place]

## 2019-03-23 ENCOUNTER — Other Ambulatory Visit: Payer: Self-pay | Admitting: Family Medicine

## 2019-03-23 DIAGNOSIS — R809 Proteinuria, unspecified: Secondary | ICD-10-CM

## 2019-03-23 DIAGNOSIS — E1129 Type 2 diabetes mellitus with other diabetic kidney complication: Secondary | ICD-10-CM

## 2019-03-23 NOTE — Telephone Encounter (Signed)
Request for diabetes medication. Humalog   Last office visit pertaining to diabetes: 01/01/2019   Lab Results  Component Value Date   HGBA1C 6.5 01/01/2019      Follow up on 05/08/2019

## 2019-04-09 ENCOUNTER — Other Ambulatory Visit: Payer: Self-pay

## 2019-04-09 ENCOUNTER — Other Ambulatory Visit
Admission: RE | Admit: 2019-04-09 | Discharge: 2019-04-09 | Disposition: A | Discharge status: Home / Self Care | Payer: Medicare PPO | Source: Ambulatory Visit | Attending: Gastroenterology | Admitting: Gastroenterology

## 2019-04-09 ENCOUNTER — Ambulatory Visit: Payer: Self-pay

## 2019-04-09 DIAGNOSIS — Z20828 Contact with and (suspected) exposure to other viral communicable diseases: Secondary | ICD-10-CM | POA: Diagnosis not present

## 2019-04-09 DIAGNOSIS — Z01812 Encounter for preprocedural laboratory examination: Secondary | ICD-10-CM | POA: Insufficient documentation

## 2019-04-09 DIAGNOSIS — K635 Polyp of colon: Secondary | ICD-10-CM | POA: Insufficient documentation

## 2019-04-09 DIAGNOSIS — E1129 Type 2 diabetes mellitus with other diabetic kidney complication: Secondary | ICD-10-CM

## 2019-04-09 DIAGNOSIS — R809 Proteinuria, unspecified: Secondary | ICD-10-CM

## 2019-04-09 LAB — SARS CORONAVIRUS 2 (TAT 6-24 HRS): SARS Coronavirus 2: NEGATIVE

## 2019-04-09 NOTE — Chronic Care Management (AMB) (Signed)
  Chronic Care Management   Follow Up Note   04/09/2019 Name: Amber Reyes MRN: [id number] DOB: 1946-08-15  Referred by: Steele Sizer, MD Reason for referral : Chronic Care Management (returning patient call)   Subjective: "I got my letter from [facility] saying I could get my insulin but they needed a prescription from Dr. Ancil Boozer"   Objective:  Assessment: Amber Reyes is a 73 year old female patient of Dr. Steele Sizer, who was referred to [facility] Team for med assistance and management of her chronic conditions.  Review of patient status, including review of consultants reports, relevant laboratory and other test results, and collaboration with appropriate care team members and the patient's provider was performed as part of comprehensive patient evaluation and provision of chronic care management services.    Goals Addressed            This Visit's Progress   . I'm now in the doughnut hole (pt-stated)       Current Barriers:  . financial  Pharmacist Clinical Goal(s): Over the next 14 days, Amber Reyes will provide the necessary supplementary documents (proof of out of pocket prescription expenditure, proof of household income) needed for medication assistance applications to CCM pharmacist.   Interventions: . CCM pharmacist will apply for medication assistance program for Eliquis made by BMS and prescribed by Dr. Fletcher Anon; Advair made by [place] and prescribed by Dr. Ancil Boozer . Corrected Lilly Cares assistance to include Humalog 75/25 Kwikpens . CCM RN CM collaborated with [facility] Pharmacist to report patients need for insulin prescription to be faxed/called in to [facility]  Patient Self Care Activities:  Marland Kitchen Gather necessary documents needed to apply for medication assistance  Initial goal documentation         Telephone follow up appointment with care management team member scheduled for: within 24 hours with clinic pharmacist    Eleaner Dibartolo E. Rollene Rotunda, RN, BSN Nurse Care  Coordinator [facility] Care Management  [phone]

## 2019-04-09 NOTE — Patient Instructions (Signed)
  Thank you allowing the Chronic Care Management Team to be a part of your care! It was a pleasure speaking with you today!   CCM (Chronic Care Management) Team   Trish Fountain RN, BSN Nurse Care Coordinator  [phone]  Ruben Reason PharmD  Clinical Pharmacist  [phone]   Elliot Gurney, LCSW Clinical Social Worker [phone]  Goals Addressed            This Visit's Progress   . I'm now in the doughnut hole (pt-stated)       Current Barriers:  . financial  Pharmacist Clinical Goal(s): Over the next 14 days, [name] will provide the necessary supplementary documents (proof of out of pocket prescription expenditure, proof of household income) needed for medication assistance applications to CCM pharmacist.   Interventions: . CCM pharmacist will apply for medication assistance program for Eliquis made by BMS and prescribed by Dr. Fletcher Anon; Advair made by [place] and prescribed by Dr. Ancil Boozer . Corrected Lilly Cares assistance to include Humalog 75/25 Kwikpens . CCM RN CM collaborated with [facility] Pharmacist to report patients need for insulin prescription to be faxed/called in to [facility]  Patient Self Care Activities:  Marland Kitchen Gather necessary documents needed to apply for medication assistance  Initial goal documentation        The patient verbalized understanding of instructions provided today and declined a print copy of patient instruction materials.   Telephone follow up appointment with care management team member scheduled for: within 24 hours from [place] have any symptoms of stroke. "BE FAST" is an easy way to remember the main warning signs: ? B - Balance. Signs are dizziness, sudden trouble walking, or loss of balance. ? E - Eyes. Signs are trouble seeing or a sudden change in how you see. ? F - Face. Signs are sudden weakness or loss of feeling of the face, or the face or eyelid drooping on one side. ? A - Arms.  Signs are weakness or loss of feeling in an arm. This happens suddenly and usually on one side of the body. ? S - Speech. Signs are sudden trouble speaking, slurred speech, or trouble understanding what people say. ? T - Time. Time to call emergency services. Write down what time symptoms started.  You have other signs of stroke, such as: ? A sudden, very bad headache with no known cause. ? Feeling sick to your stomach (nausea). ? Throwing up (vomiting). ? Jerky movements you cannot control (seizure).  SYMPTOMS OF A HEART ATTACK  What are the signs or symptoms? Symptoms of this condition include:  Chest pain. It may feel like: ? Crushing or squeezing. ? Tightness, pressure, fullness, or heaviness.  Pain in the arm, neck, jaw, back, or upper body.  Shortness of breath.  Heartburn.  Indigestion.  Nausea.  Cold sweats.  Feeling tired.  Sudden lightheadedness.

## 2019-04-10 ENCOUNTER — Ambulatory Visit (INDEPENDENT_AMBULATORY_CARE_PROVIDER_SITE_OTHER): Payer: Medicare PPO | Admitting: Pharmacist

## 2019-04-10 DIAGNOSIS — E1129 Type 2 diabetes mellitus with other diabetic kidney complication: Secondary | ICD-10-CM

## 2019-04-10 DIAGNOSIS — Z794 Long term (current) use of insulin: Secondary | ICD-10-CM | POA: Diagnosis not present

## 2019-04-10 DIAGNOSIS — R809 Proteinuria, unspecified: Secondary | ICD-10-CM

## 2019-04-11 ENCOUNTER — Telehealth: Payer: Self-pay | Admitting: Gastroenterology

## 2019-04-11 ENCOUNTER — Telehealth: Payer: Self-pay | Admitting: Cardiovascular Disease

## 2019-04-11 ENCOUNTER — Other Ambulatory Visit: Payer: Self-pay

## 2019-04-11 NOTE — Telephone Encounter (Signed)
° °  [facility] Pre-operative Risk Assessment    Request for surgical clearance:  1. What type of surgery is being performed? colonoscopy   2. When is this surgery scheduled? 04/13/19  3. What type of clearance is required (medical clearance vs. Pharmacy clearance to hold med vs. Both)? both  4. Are there any medications that need to be held prior to surgery and how long? Advise regarding eliquis and for length of time held prior to procedure  5. Practice name and name of physician performing surgery? [facility]  6. What is your office phone number [phone]   7.   What is your office fax number [phone]  8.   Anesthesia type (None, local, MAC, general) ? Not noted   Marykay Lex 04/11/2019, 4:49 PM  _________________________________________________________________   (provider comments below)

## 2019-04-11 NOTE — Telephone Encounter (Signed)
Patient has contacted the office in regards to stopping her blood thinner.  She said last time Dr. Fletcher Anon advised her to stop it 2-3 days before her procedure, so she has stopped it now.  Her previous blood thinner request sent in Feb Dr. Fletcher Anon advised to stop 2 days before.  A new blood thinner request has been sent to Dr. Tyrell Antonio office through [place].  Thanks [employer]

## 2019-04-11 NOTE — Telephone Encounter (Signed)
Pt left vm for Sharyn Lull regarding her procedure on 04/13/19

## 2019-04-12 NOTE — Telephone Encounter (Signed)
Patient with diagnosis of afib on Eliquis for anticoagulation.    Procedure:  colonoscopy  Date of procedure: 04/13/2019  CHADS2-VASc score of  5 (CHF, HTN, AGE, DM2, female)  CrCl 68 ml/min  Per office protocol, patient can hold Elqiuis for 1 day prior to procedure.

## 2019-04-12 NOTE — Telephone Encounter (Signed)
   Primary Cardiologist: No primary care provider on file.  Chart reviewed as part of pre-operative protocol coverage. Given past medical history and time since last visit, based on ACC/AHA guidelines, KELLYANN ORDWAY would be at acceptable risk for the planned procedure without further cardiovascular testing.   Per pharmacy:  Patient with diagnosis of afib on Eliquis for anticoagulation.    Procedure: colonoscopy Date of procedure: 04/13/2019  CHADS2-VASc score of  5 (CHF, HTN, AGE, DM2, female)  CrCl 68 ml/min  Per office protocol, patient can hold Elqiuis for 1 day prior to procedure.   I will route this recommendation to the requesting party via Epic fax function and remove from pre-op pool.  Please call with questions.  Kathyrn Drown, NP 04/12/2019, 12:23 PM

## 2019-04-13 ENCOUNTER — Ambulatory Visit: Payer: Medicare PPO | Admitting: Anesthesiology

## 2019-04-13 ENCOUNTER — Other Ambulatory Visit: Payer: Self-pay

## 2019-04-13 ENCOUNTER — Ambulatory Visit
Admission: RE | Admit: 2019-04-13 | Discharge: 2019-04-13 | Disposition: A | Discharge status: Home / Self Care | Payer: Medicare PPO | Attending: Gastroenterology | Admitting: Gastroenterology

## 2019-04-13 ENCOUNTER — Encounter
Admission: RE | Disposition: A | Discharge status: Home / Self Care | Payer: Self-pay | Source: Home / Self Care | Attending: Gastroenterology

## 2019-04-13 DIAGNOSIS — Z794 Long term (current) use of insulin: Secondary | ICD-10-CM | POA: Diagnosis not present

## 2019-04-13 DIAGNOSIS — M109 Gout, unspecified: Secondary | ICD-10-CM | POA: Insufficient documentation

## 2019-04-13 DIAGNOSIS — N183 Chronic kidney disease, stage 3 (moderate): Secondary | ICD-10-CM | POA: Diagnosis not present

## 2019-04-13 DIAGNOSIS — I872 Venous insufficiency (chronic) (peripheral): Secondary | ICD-10-CM | POA: Insufficient documentation

## 2019-04-13 DIAGNOSIS — D124 Benign neoplasm of descending colon: Secondary | ICD-10-CM | POA: Diagnosis not present

## 2019-04-13 DIAGNOSIS — K648 Other hemorrhoids: Secondary | ICD-10-CM | POA: Diagnosis not present

## 2019-04-13 DIAGNOSIS — E785 Hyperlipidemia, unspecified: Secondary | ICD-10-CM | POA: Insufficient documentation

## 2019-04-13 DIAGNOSIS — E559 Vitamin D deficiency, unspecified: Secondary | ICD-10-CM | POA: Insufficient documentation

## 2019-04-13 DIAGNOSIS — F419 Anxiety disorder, unspecified: Secondary | ICD-10-CM | POA: Insufficient documentation

## 2019-04-13 DIAGNOSIS — E1122 Type 2 diabetes mellitus with diabetic chronic kidney disease: Secondary | ICD-10-CM | POA: Diagnosis not present

## 2019-04-13 DIAGNOSIS — Z1211 Encounter for screening for malignant neoplasm of colon: Secondary | ICD-10-CM | POA: Diagnosis not present

## 2019-04-13 DIAGNOSIS — D122 Benign neoplasm of ascending colon: Secondary | ICD-10-CM | POA: Diagnosis not present

## 2019-04-13 DIAGNOSIS — I5032 Chronic diastolic (congestive) heart failure: Secondary | ICD-10-CM | POA: Diagnosis not present

## 2019-04-13 DIAGNOSIS — Z7901 Long term (current) use of anticoagulants: Secondary | ICD-10-CM | POA: Diagnosis not present

## 2019-04-13 DIAGNOSIS — Z7951 Long term (current) use of inhaled steroids: Secondary | ICD-10-CM | POA: Diagnosis not present

## 2019-04-13 DIAGNOSIS — D631 Anemia in chronic kidney disease: Secondary | ICD-10-CM | POA: Diagnosis not present

## 2019-04-13 DIAGNOSIS — K635 Polyp of colon: Secondary | ICD-10-CM

## 2019-04-13 DIAGNOSIS — Z79899 Other long term (current) drug therapy: Secondary | ICD-10-CM | POA: Insufficient documentation

## 2019-04-13 DIAGNOSIS — I130 Hypertensive heart and chronic kidney disease with heart failure and stage 1 through stage 4 chronic kidney disease, or unspecified chronic kidney disease: Secondary | ICD-10-CM | POA: Diagnosis not present

## 2019-04-13 DIAGNOSIS — G2581 Restless legs syndrome: Secondary | ICD-10-CM | POA: Insufficient documentation

## 2019-04-13 DIAGNOSIS — K640 First degree hemorrhoids: Secondary | ICD-10-CM | POA: Insufficient documentation

## 2019-04-13 DIAGNOSIS — Z8719 Personal history of other diseases of the digestive system: Secondary | ICD-10-CM | POA: Diagnosis not present

## 2019-04-13 DIAGNOSIS — Z86010 Personal history of colonic polyps: Secondary | ICD-10-CM

## 2019-04-13 DIAGNOSIS — D126 Benign neoplasm of colon, unspecified: Secondary | ICD-10-CM | POA: Diagnosis not present

## 2019-04-13 HISTORY — PX: COLONOSCOPY WITH PROPOFOL: SHX5780

## 2019-04-13 LAB — GLUCOSE, CAPILLARY: Glucose-Capillary: 149 mg/dL — ABNORMAL HIGH (ref 70–99)

## 2019-04-13 SURGERY — COLONOSCOPY WITH PROPOFOL
Anesthesia: General

## 2019-04-13 MED ORDER — PROPOFOL 10 MG/ML IV BOLUS
INTRAVENOUS | Status: AC
  Filled 2019-04-13: qty 20

## 2019-04-13 MED ORDER — PROPOFOL 500 MG/50ML IV EMUL
INTRAVENOUS | Status: DC | PRN
  Administered 2019-04-13: 125 ug/kg/min via INTRAVENOUS

## 2019-04-13 MED ORDER — PROPOFOL 10 MG/ML IV BOLUS
INTRAVENOUS | Status: DC | PRN
  Administered 2019-04-13: 30 mg via INTRAVENOUS
  Administered 2019-04-13: 20 mg via INTRAVENOUS

## 2019-04-13 MED ORDER — SODIUM CHLORIDE 0.9 % IV SOLN
INTRAVENOUS | Status: DC
  Administered 2019-04-13: 08:00:00 via INTRAVENOUS

## 2019-04-13 MED ORDER — PROPOFOL 500 MG/50ML IV EMUL
INTRAVENOUS | Status: AC
  Filled 2019-04-13: qty 50

## 2019-04-13 NOTE — Anesthesia Procedure Notes (Signed)
Date/Time: 04/13/2019 8:29 AM Performed by: Nelda Marseille, CRNA Pre-anesthesia Checklist: Patient identified, Emergency Drugs available, Suction available, Patient being monitored and Timeout performed Oxygen Delivery Method: Simple face mask

## 2019-04-13 NOTE — Anesthesia Postprocedure Evaluation (Signed)
Anesthesia Post Note  Patient: Amber Reyes  Procedure(s) Performed: COLONOSCOPY WITH PROPOFOL (N/A )  Patient location during evaluation: PACU Anesthesia Type: General Level of consciousness: awake and alert Pain management: pain level controlled Vital Signs Assessment: post-procedure vital signs reviewed and stable Respiratory status: spontaneous breathing, nonlabored ventilation and respiratory function stable Cardiovascular status: blood pressure returned to baseline and stable Postop Assessment: no apparent nausea or vomiting Anesthetic complications: no     Last Vitals:  Vitals:   04/13/19 0858 04/13/19 0908  BP: (!) 149/76 (!) 165/84  Pulse: 79 70  Resp: 14 19  Temp:    SpO2: 100% 100%    Last Pain:  Vitals:   04/13/19 0908  TempSrc:   PainSc: 0-No pain                 Durenda Hurt

## 2019-04-13 NOTE — Chronic Care Management (AMB) (Signed)
  Chronic Care Management   Follow Up Note   04/13/2019 Name: Amber Reyes MRN: [id number] DOB: May 13, 1946   Subjective: Amber Reyes is a 73 y.o. year old female who is a primary care patient of Steele Sizer, MD. Amber Reyes contacted pharmacist today stating she is now in the Medicare "donut hole" and needs additional assistnace with her medications, most urgently her Humalog 75/25 KwikPen mix.   Assessment: #Humalog: Called [facility] to give verbal rx  Goals Addressed            This Visit's Progress   . I'm now in the doughnut hole (pt-stated)       Current Barriers:  . financial  Pharmacist Clinical Goal(s): Over the next 14 days, Amber Reyes will provide the necessary supplementary documents (proof of out of pocket prescription expenditure, proof of household income) needed for medication assistance applications to CCM pharmacist.   Interventions: . CCM pharmacist will apply for medication assistance program for Eliquis made by BMS and prescribed by Dr. Fletcher Anon; Advair made by [place] and prescribed by Dr. Ancil Boozer . Corrected Lilly Cares assistance to include Humalog 75/25 Kwikpens . CCM RN CM collaborated with [facility] Pharmacist to report patients need for insulin prescription to be faxed/called in to [place] . Updated 8/18: incoming call from patient regarding a letter she received over the weekend that [facility] does not have her prescription for Humalog 75/25  Patient Self Care Activities:  Marland Kitchen Gather necessary documents needed to apply for medication assistance  Please see past updates related to this goal by clicking on the "Past Updates" button in the selected goal          Telephone follow up appointment with care management team member scheduled for: 1 week with PharmD  Amber Reyes, PharmD Clinical Pharmacist [facility] [phone]

## 2019-04-13 NOTE — Op Note (Addendum)
[facility] Gastroenterology Patient Name: Amber Reyes Procedure Date: 04/13/2019 7:48 AM MRN: [id number] Account #: [id number] Date of Birth: [birth year] Admit Type: Outpatient Age: 73 Room: [facility] ENDO ROOM 4 Gender: Female Note Status: Finalized Procedure:            Colonoscopy Indications:          High risk colon cancer surveillance: Personal history                        of adenoma (10 mm or greater in size) Providers:            Jonathon Bellows MD, MD Medicines:            Monitored Anesthesia Care Complications:        No immediate complications. Procedure:            Pre-Anesthesia Assessment:                       - Prior to the procedure, a History and Physical was                        performed, and patient medications, allergies and                        sensitivities were reviewed. The patient's tolerance of                        previous anesthesia was reviewed.                       - The risks and benefits of the procedure and the                        sedation options and risks were discussed with the                        patient. All questions were answered and informed                        consent was obtained.                       - ASA Grade Assessment: III - A patient with severe                        systemic disease.                       After obtaining informed consent, the colonoscope was                        passed under direct vision. Throughout the procedure,                        the patient's blood pressure, pulse, and oxygen                        saturations were monitored continuously. The                        Colonoscope was introduced through the anus and  advanced to the the cecum, identified by the                        appendiceal orifice. The colonoscopy was performed with                        ease. The patient tolerated the procedure well. The                        quality of the  bowel preparation was excellent. Findings:      The perianal and digital rectal examinations were normal.      Three sessile polyps were found in the ascending colon. The polyps were       4 to 6 mm in size. These polyps were removed with a cold snare.       Resection and retrieval were complete.      A 3 mm polyp was found in the ascending colon. The polyp was sessile.       The polyp was removed with a cold biopsy forceps. Resection and       retrieval were complete.      A 6 mm polyp was found in the descending colon. The polyp was sessile.       The polyp was removed with a cold snare. Resection and retrieval were       complete. To prevent bleeding after the polypectomy, one hemostatic clip       was successfully placed. There was no bleeding during, or at the end, of       the procedure.      Non-bleeding internal hemorrhoids were found during retroflexion. The       hemorrhoids were medium-sized and Grade I (internal hemorrhoids that do       not prolapse).      The exam was otherwise without abnormality on direct and retroflexion       views. Impression:           - Three 4 to 6 mm polyps in the ascending colon,                        removed with a cold snare. Resected and retrieved.                       - One 3 mm polyp in the ascending colon, removed with a                        cold biopsy forceps. Resected and retrieved.                       - One 6 mm polyp in the descending colon, removed with                        a cold snare. Resected and retrieved.                       - Non-bleeding internal hemorrhoids.                       - The examination was otherwise normal on direct and  retroflexion views. Recommendation:       - Discharge patient to home (with escort).                       - Resume previous diet.                       - Continue present medications.                       - Await pathology results.                       - Repeat  colonoscopy in 1 year for surveillance. Procedure Code(s):    --- Professional ---                       [phone], Colonoscopy, flexible; with removal of tumor(s),                        polyp(s), or other lesion(s) by snare technique                       45380, 43, Colonoscopy, flexible; with biopsy, single                        or multiple Diagnosis Code(s):    --- Professional ---                       K63.5, Polyp of colon                       Z86.010, Personal history of colonic polyps                       K64.0, First degree hemorrhoids CPT copyright 2019 American Medical Association. All rights reserved. The codes documented in this report are preliminary and upon coder review may  be revised to meet current compliance requirements. Jonathon Bellows, MD Jonathon Bellows MD, MD 04/13/2019 8:46:31 AM This report has been signed electronically. Number of Addenda: 0 Note Initiated On: 04/13/2019 7:48 AM Scope Withdrawal Time: 0 hours 15 minutes 27 seconds  Total Procedure Duration: 0 hours 21 minutes 55 seconds  Estimated Blood Loss: Estimated blood loss: none.      [facility]

## 2019-04-13 NOTE — Anesthesia Preprocedure Evaluation (Signed)
Anesthesia Evaluation  Patient identified by MRN, date of birth, ID band Patient awake    Reviewed: Allergy & Precautions, H&P , NPO status , Patient's Chart, lab work & pertinent test results  Airway Mallampati: II  TM Distance: >3 FB Neck ROM: full    Dental  (+) Teeth Intact   Pulmonary shortness of breath and with exertion, asthma , sleep apnea ,           Cardiovascular hypertension, +CHF (preserved EF)       Neuro/Psych PSYCHIATRIC DISORDERS Anxiety negative neurological ROS     GI/Hepatic negative GI ROS, Neg liver ROS,   Endo/Other  diabetesMorbid obesity (super morbid obesity, BMI 55)  Renal/GU CRFRenal disease  negative genitourinary   Musculoskeletal   Abdominal   Peds  Hematology  (+) Blood dyscrasia, anemia ,   Anesthesia Other Findings Past Medical History: No date: Allergic rhinitis, cause unspecified No date: Anxiety No date: Chronic diastolic CHF (congestive heart failure) (HCC) No date: CKD (chronic kidney disease), stage III (HCC) No date: Edema No date: Essential hypertension, benign No date: Gout, unspecified No date: Heart murmur     Comment:  as child No date: Hyperlipidemia No date: Lipoma of unspecified site No date: Other and unspecified disc disorder of lumbar region No date: Other general symptoms(780.99) No date: PAC (premature atrial contraction) No date: Renal insufficiency No date: Restless legs syndrome (RLS) No date: Sebaceous cyst 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 No date: 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 complication, uncontrolled No date: Unspecified asthma(493.90) No date: Unspecified disease of pericardium No date: Unspecified sleep apnea No date: Unspecified vitamin D deficiency No date: Vaginitis and vulvovaginitis, unspecified No date: Venous insufficiency  Past Surgical History: 2002: CARDIAC CATHETERIZATION     Comment:   [facility] 09/25/2018: COLONOSCOPY WITH PROPOFOL; N/A     Comment:  Procedure: COLONOSCOPY WITH PROPOFOL;  Surgeon: Jonathon Bellows, MD;  Location: [facility] ENDOSCOPY;  Service:               Gastroenterology;  Laterality: N/A; No date: PERICARDIUM SURGERY  BMI    Body Mass Index: 54.95 kg/m      Reproductive/Obstetrics negative OB ROS                             Anesthesia Physical Anesthesia Plan  ASA: III  Anesthesia Plan: General   Post-op Pain Management:    Induction:   PONV Risk Score and Plan: Propofol infusion and TIVA  Airway Management Planned: Natural Airway and Nasal Cannula  Additional Equipment:   Intra-op Plan:   Post-operative Plan:   Informed Consent: I have reviewed the patients History and Physical, chart, labs and discussed the procedure including the risks, benefits and alternatives for the proposed anesthesia with the patient or authorized representative who has indicated his/her understanding and acceptance.     Dental Advisory Given  Plan Discussed with: Anesthesiologist, CRNA and Surgeon  Anesthesia Plan Comments:         Anesthesia Quick Evaluation

## 2019-04-13 NOTE — Transfer of Care (Signed)
Immediate Anesthesia Transfer of Care Note  Patient: Amber Reyes  Procedure(s) Performed: COLONOSCOPY WITH PROPOFOL (N/A )  Patient Location: PACU  Anesthesia Type:General  Level of Consciousness: awake, alert  and oriented  Airway & Oxygen Therapy: Patient Spontanous Breathing and Patient connected to face mask oxygen  Post-op Assessment: Report given to RN and Post -op Vital signs reviewed and stable  Post vital signs: Reviewed and stable  Last Vitals:  Vitals Value Taken Time  BP    Temp    Pulse    Resp    SpO2      Last Pain:  Vitals:   04/13/19 0745  TempSrc: Tympanic         Complications: No apparent anesthesia complications

## 2019-04-13 NOTE — Anesthesia Post-op Follow-up Note (Signed)
Anesthesia QCDR form completed.        

## 2019-04-13 NOTE — H&P (Signed)
Jonathon Bellows, MD [address], [place], [place], [place], [address] [address], [place], [place], [place], [address] Phone: [phone]  Fax: [phone]  Primary Care Physician:  Steele Sizer, MD   Pre-Procedure History & Physical: HPI:  Amber Reyes is a 73 y.o. female is here for an colonoscopy.   Past Medical History:  Diagnosis Date  . Allergic rhinitis, cause unspecified   . Anxiety   . Chronic diastolic CHF (congestive heart failure) ([place])   . CKD (chronic kidney disease), stage III ([place])   . Edema   . Essential hypertension, benign   . Gout, unspecified   . Heart murmur    as child  . Hyperlipidemia   . Lipoma of unspecified site   . Other and unspecified disc disorder of lumbar region   . Other general symptoms(780.99)   . PAC (premature atrial contraction)   . Renal insufficiency   . Restless legs syndrome (RLS)   . Sebaceous cyst   . Super obesity   . Type II or unspecified type diabetes mellitus without mention of complication, uncontrolled   . Unspecified asthma(493.90)   . Unspecified disease of pericardium   . Unspecified sleep apnea   . Unspecified vitamin D deficiency   . Vaginitis and vulvovaginitis, unspecified   . Venous insufficiency     Past Surgical History:  Procedure Laterality Date  . CARDIAC CATHETERIZATION  2002   [facility]  . COLONOSCOPY WITH PROPOFOL N/A 09/25/2018   Procedure: COLONOSCOPY WITH PROPOFOL;  Surgeon: Jonathon Bellows, MD;  Location: [facility] ENDOSCOPY;  Service: Gastroenterology;  Laterality: N/A;  . PERICARDIUM SURGERY      Prior to Admission medications   Medication Sig Start Date End Date Taking? Authorizing Provider  albuterol (PROVENTIL HFA;VENTOLIN HFA) 108 (90 BASE) MCG/ACT inhaler Inhale 2 puffs into the lungs every 6 (six) hours as needed for wheezing or shortness of breath.   Yes [provider]  allopurinol (ZYLOPRIM) 100 MG tablet TAKE 1 TABLET TWICE DAILY 01/03/19  Yes Sowles, Drue Stager, MD  apixaban  (ELIQUIS) 5 MG TABS tablet Take 1 tablet (5 mg total) by mouth 2 (two) times daily. 11/10/18  Yes Wellington Hampshire, MD  Cholecalciferol (VITAMIN D) 2000 UNITS tablet Take 2,000 Units by mouth daily.   Yes [provider]  colchicine (COLCRYS) 0.6 MG tablet Take 1-2 tablets (0.6-1.2 mg total) by mouth daily as needed. 11/25/17  Yes Sowles, Drue Stager, MD  diltiazem (CARDIZEM CD) 300 MG 24 hr capsule Take 1 capsule (300 mg total) by mouth daily. 01/01/19  Yes Sowles, Drue Stager, MD  Dulaglutide (TRULICITY) 1.5 IW/5.8KD SOPN Inject 1.5 mg into the skin once a week. 03/13/19  Yes Sowles, Drue Stager, MD  fluconazole (DIFLUCAN) 150 MG tablet TAKE ONE TABLET EVERY OTHER DAY AS NEEDED FOR  YEAST  INFECTION 09/22/18  Yes Sowles, Drue Stager, MD  fluticasone (FLONASE) 50 MCG/ACT nasal spray USE 2 SPRAYS IN EACH NOSTRIL EVERY DAY 11/02/18  Yes Sowles, Drue Stager, MD  Fluticasone-Salmeterol (ADVAIR DISKUS) 250-50 MCG/DOSE AEPB Inhale 1 puff into the lungs 2 (two) times daily. 01/01/19  Yes Sowles, Drue Stager, MD  furosemide (LASIX) 40 MG tablet Take 1 tablet (40 mg total) by mouth 2 (two) times daily as needed. 01/01/19  Yes Sowles, Drue Stager, MD  irbesartan (AVAPRO) 300 MG tablet Take 1 tablet (300 mg total) by mouth daily. 01/01/19  Yes Sowles, Drue Stager, MD  pravastatin (PRAVACHOL) 80 MG tablet Take 1 tablet (80 mg total) by mouth every evening. 01/01/19  Yes Steele Sizer, MD  Blood Glucose Calibration (TRUE METRIX LEVEL 1) Low SOLN 1 each by In Vitro route once a week. 12/12/17   Steele Sizer, MD  Blood Glucose Monitoring Suppl (TRUE METRIX AIR GLUCOSE METER) w/Device KIT Inject 1 each into the skin 4 (four) times daily as needed. Check fsbs 4 times daily E11.22 11/01/18   Steele Sizer, MD  Elastic Bandages & Supports (MEDICAL COMPRESSION STOCKINGS) [place] 2 each by Does not apply route daily. 04/20/16   Steele Sizer, MD  HUMALOG MIX 75/25 KWIKPEN (75-25) 100 UNIT/ML Kwikpen INJECT 38 TO 60 UNITS SUBCUTANEOUSLY TWICE DAILY  03/23/19   Ancil Boozer, Drue Stager, MD  Insulin Pen Needle (NOVOFINE) 30G X 8 MM MISC Inject 10 each into the skin daily. 11/25/17   Steele Sizer, MD  metFORMIN (GLUCOPHAGE-XR) 500 MG 24 hr tablet Take 2 tablets (1,000 mg total) by mouth daily with breakfast. Patient not taking: Reported on 04/13/2019 02/09/19   Steele Sizer, MD  TRUE METRIX BLOOD GLUCOSE TEST test strip USE  AS  INSTRUCTED 05/18/18   Steele Sizer, MD  TRUEPLUS LANCETS 33G MISC 1 each by Does not apply route 3 (three) times daily. 12/12/17   Steele Sizer, MD    Allergies as of 10/04/2018  . (No Known Allergies)    Family History  Problem Relation Age of Onset  . Heart attack Mother   . Hypertension Mother   . Breast cancer Neg Hx     Social History   Socioeconomic History  . Marital status: Married    Spouse name: Not on file  . Number of children: 3  . Years of education: Not on file  . Highest education level: Associate degree: academic program  Occupational History  . Occupation: retired  Scientific laboratory technician  . Financial resource strain: Not hard at all  . Food insecurity    Worry: Never true    Inability: Never true  . Transportation needs    Medical: No    Non-medical: No  Tobacco Use  . Smoking status: Never Smoker  . Smokeless tobacco: Never Used  . Tobacco comment: n/a  Substance and Sexual Activity  . Alcohol use: No    Alcohol/week: 0.0 standard drinks  . Drug use: No  . Sexual activity: Not Currently  Lifestyle  . Physical activity    Days per week: 0 days    Minutes per session: Not on file  . Stress: Only a little  Relationships  . Social connections    Talks on phone: More than three times a week    Gets together: Once a week    Attends religious service: More than 4 times per year    Active member of club or organization: Yes    Attends meetings of clubs or organizations: More than 4 times per year    Relationship status: Married  . Intimate partner violence    Fear of current or ex  partner: No    Emotionally abused: No    Physically abused: No    Forced sexual activity: No  Other Topics Concern  . Not on file  Social History Narrative  . Not on file    Review of Systems: See HPI, otherwise negative ROS  Physical Exam: BP (!) 176/70   Pulse 73   Temp (!) 97.2 F (36.2 C) (Tympanic)   Resp 17   Ht 5' 10" (1.778 m)   Wt (!) 173.7 kg   SpO2 100%   BMI 54.95 kg/m  General:   Alert,  pleasant and  cooperative in NAD Head:  Normocephalic and atraumatic. Neck:  Supple; no masses or thyromegaly. Lungs:  Clear throughout to auscultation, normal respiratory effort.    Heart:  +S1, +S2, Regular rate and rhythm, No edema. Abdomen:  Soft, nontender and nondistended. Normal bowel sounds, without guarding, and without rebound.   Neurologic:  Alert and  oriented x4;  grossly normal neurologically.  Impression/Plan: Amber Reyes is here for an colonoscopy to be performed for surveillance due to prior history of colon polyps   Risks, benefits, limitations, and alternatives regarding  colonoscopy have been reviewed with the patient.  Questions have been answered.  All parties agreeable.   Jonathon Bellows, MD  04/13/2019, 8:14 AM

## 2019-04-16 ENCOUNTER — Encounter: Payer: Self-pay | Admitting: Gastroenterology

## 2019-04-16 LAB — SURGICAL PATHOLOGY

## 2019-04-18 ENCOUNTER — Other Ambulatory Visit: Payer: Self-pay | Admitting: Family Medicine

## 2019-04-18 DIAGNOSIS — E1129 Type 2 diabetes mellitus with other diabetic kidney complication: Secondary | ICD-10-CM

## 2019-04-18 DIAGNOSIS — Z794 Long term (current) use of insulin: Secondary | ICD-10-CM

## 2019-04-24 ENCOUNTER — Telehealth: Payer: Self-pay | Admitting: Cardiovascular Disease

## 2019-04-24 NOTE — Telephone Encounter (Signed)
Patient calling the office for samples of medication:   1.  What medication and dosage are you requesting samples for? Eliquis 5mg  bid  2.  Are you currently out of this medication? Have 5 days worth

## 2019-04-25 NOTE — Telephone Encounter (Signed)
Left a message on voice mail for the patient to contact our office regarding Eliquis samples.

## 2019-04-25 NOTE — Telephone Encounter (Signed)
Patient notified that Dr. Fletcher Anon mentioned if the Eliquis was too expensive, he could switch her to Warfarin. The patient does not want to switch at this time and would like enough samples to get her through until she gets paid. The patient also stated, she was told by the patient assistance foundation once she has paid so much for the Eliquis, she could possibly be approved for patient assistance.   Medication Samples have been provided to the patient.  Drug name: Eliquis       Strength: 5 mg         Qty: 28*  LOT: [id number]  Exp.Date: 11/2020      Dolores Lory 10:41 AM 04/25/2019

## 2019-05-01 ENCOUNTER — Encounter: Payer: Self-pay | Admitting: Gastroenterology

## 2019-05-02 ENCOUNTER — Ambulatory Visit: Payer: Self-pay | Admitting: Pharmacist

## 2019-05-02 DIAGNOSIS — E1129 Type 2 diabetes mellitus with other diabetic kidney complication: Secondary | ICD-10-CM

## 2019-05-04 ENCOUNTER — Ambulatory Visit (INDEPENDENT_AMBULATORY_CARE_PROVIDER_SITE_OTHER): Payer: Medicare PPO | Admitting: Pharmacist

## 2019-05-04 DIAGNOSIS — Z794 Long term (current) use of insulin: Secondary | ICD-10-CM

## 2019-05-04 DIAGNOSIS — E1129 Type 2 diabetes mellitus with other diabetic kidney complication: Secondary | ICD-10-CM | POA: Diagnosis not present

## 2019-05-04 DIAGNOSIS — R809 Proteinuria, unspecified: Secondary | ICD-10-CM

## 2019-05-04 NOTE — Patient Instructions (Signed)
Goals Addressed            This Visit's Progress   . I'm now in the doughnut hole (pt-stated)       Current Barriers:  . financial  Pharmacist Clinical Goal(s): Over the next 14 days, [name] will provide the necessary supplementary documents (proof of out of pocket prescription expenditure, proof of household income) needed for medication assistance applications to CCM pharmacist.   Interventions: . CCM pharmacist will apply for medication assistance program for Eliquis made by [facility] and prescribed by Dr. Fletcher Anon; Advair made by [place] and prescribed by Dr. Ancil Boozer . Corrected [employer] assistance to include Humalog 75/25 Kwikpens . CCM RN CM collaborated with [facility] Pharmacist to report patients need for insulin prescription to be faxed/called in to [place] . Updated 8/18: incoming call from patient regarding a letter she received over the weekend that [facility] does not have her prescription for Humalog 75/25 . Updated: confirm [facility] has prescription . Updated 9/11: patient has confirmed shipment with [facility] of Humalog  Patient Self Care Activities:  Marland Kitchen Gather necessary documents needed to apply for medication assistance  Please see past updates related to this goal by clicking on the "Past Updates" button in the selected goal

## 2019-05-04 NOTE — Chronic Care Management (AMB) (Signed)
  Chronic Care Management   Follow Up Note   05/04/2019- Late Entry Name: Amber Reyes MRN: [id number] DOB: Aug 23, 1946   Subjective: Amber Reyes is a 73 y.o. year old female who is a primary care patient of Steele Sizer, MD. Incoming telephone call from Amber Reyes, HIPAA identifiers verified.   Assessment: #Humalog: Patient has confirmed shipment with [facility] for Humalog delivery  Goals Addressed            This Visit's Progress   . I'm now in the doughnut hole (pt-stated)       Current Barriers:  . financial  Pharmacist Clinical Goal(s): Over the next 14 days, Amber Reyes will provide the necessary supplementary documents (proof of out of pocket prescription expenditure, proof of household income) needed for medication assistance applications to CCM pharmacist.   Interventions: . CCM pharmacist will apply for medication assistance program for Eliquis made by [facility] and prescribed by Dr. Fletcher Anon; Advair made by [place] and prescribed by Dr. Ancil Boozer . Corrected [employer] assistance to include Humalog 75/25 Kwikpens . CCM RN CM collaborated with [facility] Pharmacist to report patients need for insulin prescription to be faxed/called in to [place] . Updated 8/18: incoming call from patient regarding a letter she received over the weekend that [facility] does not have her prescription for Humalog 75/25 . Updated: confirm [facility] has prescription . Updated 9/11: patient has confirmed shipment with [facility] of Humalog  Patient Self Care Activities:  Marland Kitchen Gather necessary documents needed to apply for medication assistance  Please see past updates related to this goal by clicking on the "Past Updates" button in the selected goal          Telephone follow up appointment with care management team member scheduled for: 30 days with PharmD for DM     Ruben Reason, PharmD Clinical Pharmacist [facility] [phone]

## 2019-05-04 NOTE — Chronic Care Management (AMB) (Signed)
  Chronic Care Management   Follow Up Note   05/04/2019- Late Entry Name: Amber Reyes MRN: [id number] DOB: 1945-12-01   Subjective: Amber Reyes is a 73 y.o. year old female who is a primary care patient of Steele Sizer, MD. Amber Reyes contacted pharmacist today stating she is now in the Medicare "donut hole" and needs additional assistnace with her medications, most urgently her Humalog 75/25 KwikPen mix.   Assessment: #Humalog: Called [employer] to verify Humalog prescription as patient still hadn't received contact from [employer].   Goals Addressed            This Visit's Progress   . I'm now in the doughnut hole (pt-stated)       Current Barriers:  . financial  Pharmacist Clinical Goal(s): Over the next 14 days, Amber Reyes will provide the necessary supplementary documents (proof of out of pocket prescription expenditure, proof of household income) needed for medication assistance applications to CCM pharmacist.   Interventions: . CCM pharmacist will apply for medication assistance program for Eliquis made by [facility] and prescribed by Dr. Fletcher Anon; Advair made by [place] and prescribed by Dr. Ancil Boozer . Corrected [employer] C[employer] assistance to include Humalog 75/25 Kwikpens . CCM RN CM collaborated with [facility] Pharmacist to report patients need for insulin prescription to be faxed/called in to [place] . Updated 8/18: incoming call from patient regarding a letter she received over the weekend that [facility] does not have her prescription for Humalog 75/25 . Updated: confirm [facility] has prescription  Patient Self Care Activities:  Marland Kitchen Gather necessary documents needed to apply for medication assistance  Please see past updates related to this goal by clicking on the "Past Updates" button in the selected goal          Telephone follow up appointment with care management team member scheduled for: 1 week with PharmD  Ruben Reason, PharmD Clinical  Pharmacist [facility] [phone]

## 2019-05-04 NOTE — Patient Instructions (Signed)
Goals Addressed            This Visit's Progress   . I'm now in the doughnut hole (pt-stated)       Current Barriers:  . financial  Pharmacist Clinical Goal(s): Over the next 14 days, [name] will provide the necessary supplementary documents (proof of out of pocket prescription expenditure, proof of household income) needed for medication assistance applications to CCM pharmacist.   Interventions: . CCM pharmacist will apply for medication assistance program for Eliquis made by [facility] and prescribed by Dr. Fletcher Anon; Advair made by [place] and prescribed by Dr. Ancil Boozer . Corrected [employer] assistance to include Humalog 75/25 Kwikpens . CCM RN CM collaborated with [facility] Pharmacist to report patients need for insulin prescription to be faxed/called in to [place] . Updated 8/18: incoming call from patient regarding a letter she received over the weekend that [facility] does not have her prescription for Humalog 75/25 . Updated: confirm [facility] has prescription  Patient Self Care Activities:  Marland Kitchen Gather necessary documents needed to apply for medication assistance  Please see past updates related to this goal by clicking on the "Past Updates" button in the selected goal

## 2019-05-08 ENCOUNTER — Other Ambulatory Visit: Payer: Self-pay

## 2019-05-08 ENCOUNTER — Encounter: Payer: Self-pay | Admitting: Family Medicine

## 2019-05-08 ENCOUNTER — Ambulatory Visit (INDEPENDENT_AMBULATORY_CARE_PROVIDER_SITE_OTHER): Payer: Medicare PPO | Admitting: Family Medicine

## 2019-05-08 VITALS — BP 131/66 | HR 59

## 2019-05-08 DIAGNOSIS — I4891 Unspecified atrial fibrillation: Secondary | ICD-10-CM

## 2019-05-08 DIAGNOSIS — M109 Gout, unspecified: Secondary | ICD-10-CM

## 2019-05-08 DIAGNOSIS — D649 Anemia, unspecified: Secondary | ICD-10-CM

## 2019-05-08 DIAGNOSIS — I10 Essential (primary) hypertension: Secondary | ICD-10-CM

## 2019-05-08 DIAGNOSIS — I700 Atherosclerosis of aorta: Secondary | ICD-10-CM | POA: Diagnosis not present

## 2019-05-08 DIAGNOSIS — I129 Hypertensive chronic kidney disease with stage 1 through stage 4 chronic kidney disease, or unspecified chronic kidney disease: Secondary | ICD-10-CM | POA: Diagnosis not present

## 2019-05-08 DIAGNOSIS — I110 Hypertensive heart disease with heart failure: Secondary | ICD-10-CM

## 2019-05-08 DIAGNOSIS — E1169 Type 2 diabetes mellitus with other specified complication: Secondary | ICD-10-CM | POA: Diagnosis not present

## 2019-05-08 DIAGNOSIS — Z794 Long term (current) use of insulin: Secondary | ICD-10-CM

## 2019-05-08 DIAGNOSIS — N183 Chronic kidney disease, stage 3 unspecified: Secondary | ICD-10-CM

## 2019-05-08 DIAGNOSIS — J4540 Moderate persistent asthma, uncomplicated: Secondary | ICD-10-CM

## 2019-05-08 DIAGNOSIS — R809 Proteinuria, unspecified: Secondary | ICD-10-CM

## 2019-05-08 DIAGNOSIS — E1129 Type 2 diabetes mellitus with other diabetic kidney complication: Secondary | ICD-10-CM | POA: Diagnosis not present

## 2019-05-08 DIAGNOSIS — E785 Hyperlipidemia, unspecified: Secondary | ICD-10-CM

## 2019-05-08 DIAGNOSIS — G8929 Other chronic pain: Secondary | ICD-10-CM

## 2019-05-08 DIAGNOSIS — M25562 Pain in left knee: Secondary | ICD-10-CM

## 2019-05-08 MED ORDER — FUROSEMIDE 40 MG PO TABS: 40.0000 mg | ORAL_TABLET | Freq: Two times a day (BID) | ORAL | 1 refills | Status: DC | PRN

## 2019-05-08 MED ORDER — DILTIAZEM HCL ER COATED BEADS 300 MG PO CP24: 300.0000 mg | ORAL_CAPSULE | Freq: Every day | ORAL | 1 refills | Status: DC

## 2019-05-08 MED ORDER — INSULIN LISPRO PROT & LISPRO (75-25 MIX) 100 UNIT/ML KWIKPEN: 38.0000 [IU] | PEN_INJECTOR | Freq: Every day | SUBCUTANEOUS | 0 refills | Status: DC

## 2019-05-08 MED ORDER — ALLOPURINOL 100 MG PO TABS: 100.0000 mg | ORAL_TABLET | Freq: Two times a day (BID) | ORAL | 1 refills | Status: DC

## 2019-05-08 MED ORDER — PRAVASTATIN SODIUM 80 MG PO TABS: 80.0000 mg | ORAL_TABLET | Freq: Every evening | ORAL | 1 refills | Status: DC

## 2019-05-08 MED ORDER — IRBESARTAN 300 MG PO TABS: 300.0000 mg | ORAL_TABLET | Freq: Every day | ORAL | 1 refills | Status: DC

## 2019-05-08 MED ORDER — FLUTICASONE-SALMETEROL 250-50 MCG/DOSE IN AEPB: 1.0000 | INHALATION_SPRAY | Freq: Two times a day (BID) | RESPIRATORY_TRACT | 1 refills | Status: DC

## 2019-05-08 NOTE — Progress Notes (Signed)
Name: Amber Reyes   MRN: [id number]    DOB: August 29, 1945   Date:05/08/2019       Progress Note  Subjective  Chief Complaint  Chief Complaint  Patient presents with  . Hypertension  . Hyperlipidemia  . Diabetes    Blood sugar was 86 this morning  . Anxiety    I connected with  [name]  on 05/08/19 at  7:40 AM EDT by a video enabled telemedicine application and verified that I am speaking with the correct person using two identifiers.  I discussed the limitations of evaluation and management by telemedicine and the availability of in person appointments. The patient expressed understanding and agreed to proceed. Staff also discussed with the patient that there may be a patient responsible charge related to this service. Patient Location: at home  Provider Location: [facility]   HPI    DMII controlled: She has been compliant with medication, she was on Humalog 75/17 Oct 2016, also started on Trulicity in 4268 and [id number] been at [place] hgbA1C was 6.5% Taking Avapro for nephropathy, still has some mild microalbuminuria, last level 45.No polyphagia, polydipsia or polyuria.Sheis now getting Trulicity through the assistance program . Fasting in the 86-90's , highest 127 , she is down to  50 units in am and 20 units at night,  HTN/CHF: taking medications and denies side effects of medications, bp is at goal, she denies chest painno recentpalpitation, found to have Afib on EKG done by Dr. Fletcher Anon - she was not wearing CPAP consistently at the timeShe has orthopnea, SOB is stable, and edema has been stable. Echo done 11/2019LV function is normaland unable to assess diastolic dysfunction, severe leftAtrium dilation , mild elevation of Pulmonary pressure, She is currently on Eliquis for Afib Stable   Hyperlipidemia: taking Pravastatin, we will continue current medication,reviewed labs done 11/2017, LDL at goal.No myalgia. She will come back for  labs later this week   RLS: symptoms are still present, described as soreness on both legs at night, improves when she rubs her legs. She was having cramps after taking lasix, but is currently taking lasix every other day and has been doing better.Not as severe now or as often  CKIII: [facility], last labs reviewed still stage III CKI with mild drop of GFR , no itching , good urine output. Explained she needs to return for labs.   OSA: she has been wearing CPAP machineevery night now, better compliance sinceshe gotmachine adjusted by Dr. Felicie Morn. She states her machine is very old and would like a new one.   Morbid Obesity: unable to check her weight today, hopefully it will help her weight loss with decrease in insulin and better diet. We will check her weight when she comes in for labs if possible   Asthma Moderate persistent/Chronic bronchitis: one flare back in02/2018. She is compliant and uses Advair twice daily as prescribed, no side effects. No wheezing or SOBat this time, she states worse during the Summer with humidity and pollen. She only saw Dr. Felicie Morn once states stable on current regiment   Afib: on Cardizem CD. Eliquis and off aspirin. She was given Eliquis back in 2015 but did not start because of cost, but is on samples now and trying to get forms filled out at Dr. Tyrell Antonio office to be able to afford medication..Seen by Dr. Fletcher Anon in 2015 and again in 2017 after admission for CHF.Last Echo was done 08/04/2018 60-65%, EKG  06/2018 showed afib, compliant now but trying she applied for assistance program but has to meet deductible first, cost now is  $400 copay, she will try to re-apply   Atherosclerosis of aorta and echo showed mild dilation of ascending aorta, continue statin and eliquis. Recheck labs this week.   Knee pain : she has pain that goes from the left outer hip to the left inner knee. She uses a cane, discussed referral  to Ortho   Patient Active Problem List   Diagnosis Date Noted  . History of colonic polyps   . Polyp of colon   . Chronic bronchitis ([place]) 08/30/2018  . Thoracic aortic atherosclerosis ([place]) 12/30/2016  . Controlled type 2 diabetes mellitus with stage 3 chronic kidney disease ([place]) 12/23/2016  . Venous insufficiency 05/21/2016  . Hypertensive heart disease 05/21/2016  . Anemia in chronic kidney disease 12/22/2015  . Pulmonary hypertension ([place]) 12/18/2015  . 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 Anxiety 04/12/2015  . Chronic kidney disease (CKD), stage III (moderate) ([place]) 04/12/2015  . Edema extremities 04/12/2015  . 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 Asthma, moderate persistent 04/12/2015  . Morbid obesity, unspecified obesity type ([place]) 04/12/2015  . Obstructive apnea 04/12/2015  . Restless leg 04/12/2015  . Allergic rhinitis 04/12/2015  . Vitamin D deficiency 04/12/2015  . Controlled gout 04/12/2015  . Premature atrial contractions 11/29/2013  . Atrial fibrillation ([place]) 11/09/2013  . Benign essential hypertension   . Hyperlipidemia     Past Surgical History:  Procedure Laterality Date  . CARDIAC CATHETERIZATION  2002   [facility]  . COLONOSCOPY WITH PROPOFOL N/A 09/25/2018   Procedure: COLONOSCOPY WITH PROPOFOL;  Surgeon: Jonathon Bellows, MD;  Location: [facility] ENDOSCOPY;  Service: Gastroenterology;  Laterality: N/A;  . COLONOSCOPY WITH PROPOFOL N/A 04/13/2019   Procedure: COLONOSCOPY WITH PROPOFOL;  Surgeon: Jonathon Bellows, MD;  Location: [facility] ENDOSCOPY;  Service: Gastroenterology;  Laterality: N/A;  . PERICARDIUM SURGERY      Family History  Problem Relation Age of Onset  . Heart attack Mother   . Hypertension Mother   . Breast cancer Neg Hx     Social History   Socioeconomic History  . Marital status: Married    Spouse name: Not on file  . Number of children: 3  . Years of education: Not on file  . Highest education level: Associate degree: academic program  Occupational  History  . Occupation: retired  Scientific laboratory technician  . Financial resource strain: Not hard at all  . Food insecurity    Worry: Never true    Inability: Never true  . Transportation needs    Medical: No    Non-medical: No  Tobacco Use  . Smoking status: Never Smoker  . Smokeless tobacco: Never Used  . Tobacco comment: n/a  Substance and Sexual Activity  . Alcohol use: No    Alcohol/week: 0.0 standard drinks  . Drug use: No  . Sexual activity: Not Currently  Lifestyle  . Physical activity    Days per week: 0 days    Minutes per session: Not on file  . Stress: Only a little  Relationships  . Social connections    Talks on phone: More than three times a week    Gets together: Once a week    Attends religious service: More than 4 times per year    Active member of club or organization: Yes    Attends meetings of clubs or organizations: More than 4 times per year    Relationship  status: Married  . Intimate partner violence    Fear of current or ex partner: No    Emotionally abused: No    Physically abused: No    Forced sexual activity: No  Other Topics Concern  . Not on file  Social History Narrative  . Not on file     Current Outpatient Medications:  .  albuterol (PROVENTIL HFA;VENTOLIN HFA) 108 (90 BASE) MCG/ACT inhaler, Inhale 2 puffs into the lungs every 6 (six) hours as needed for wheezing or shortness of breath., Disp: , Rfl:  .  allopurinol (ZYLOPRIM) 100 MG tablet, TAKE 1 TABLET TWICE DAILY, Disp: 180 tablet, Rfl: 1 .  apixaban (ELIQUIS) 5 MG TABS tablet, Take 1 tablet (5 mg total) by mouth 2 (two) times daily., Disp: 180 tablet, Rfl: 3 .  Blood Glucose Calibration (TRUE METRIX LEVEL 1) Low SOLN, 1 each by In Vitro route once a week., Disp: 1 each, Rfl: 1 .  Blood Glucose Monitoring Suppl (TRUE METRIX AIR GLUCOSE METER) w/Device KIT, Inject 1 each into the skin 4 (four) times daily as needed. Check fsbs 4 times daily E11.22, Disp: 1 kit, Rfl: 0 .  Cholecalciferol (VITAMIN  D) 2000 UNITS tablet, Take 2,000 Units by mouth daily., Disp: , Rfl:  .  colchicine (COLCRYS) 0.6 MG tablet, Take 1-2 tablets (0.6-1.2 mg total) by mouth daily as needed., Disp: 30 tablet, Rfl: 0 .  diltiazem (CARDIZEM CD) 300 MG 24 hr capsule, Take 1 capsule (300 mg total) by mouth daily., Disp: 90 capsule, Rfl: 1 .  Dulaglutide (TRULICITY) 1.5 OE/4.2PN SOPN, Inject 1.5 mg into the skin once a week., Disp: 9 mL, Rfl: 3 .  Elastic Bandages & Supports (MEDICAL COMPRESSION STOCKINGS) MISC, 2 each by Does not apply route daily., Disp: 2 each, Rfl: 2 .  fluconazole (DIFLUCAN) 150 MG tablet, TAKE ONE TABLET EVERY OTHER DAY AS NEEDED FOR  YEAST  INFECTION, Disp: 4 tablet, Rfl: 1 .  fluticasone (FLONASE) 50 MCG/ACT nasal spray, USE 2 SPRAYS IN EACH NOSTRIL EVERY DAY, Disp: 48 g, Rfl: 2 .  Fluticasone-Salmeterol (ADVAIR DISKUS) 250-50 MCG/DOSE AEPB, Inhale 1 puff into the lungs 2 (two) times daily., Disp: 3 each, Rfl: 1 .  furosemide (LASIX) 40 MG tablet, Take 1 tablet (40 mg total) by mouth 2 (two) times daily as needed., Disp: 180 tablet, Rfl: 1 .  HUMALOG MIX 75/25 KWIKPEN (75-25) 100 UNIT/ML Kwikpen, INJECT 38 TO 60 UNITS SUBCUTANEOUSLY TWICE DAILY, Disp: 15 mL, Rfl: 0 .  Insulin Pen Needle (NOVOFINE) 30G X 8 MM MISC, Inject 10 each into the skin daily., Disp: 100 each, Rfl: 1 .  irbesartan (AVAPRO) 300 MG tablet, Take 1 tablet (300 mg total) by mouth daily., Disp: 90 tablet, Rfl: 1 .  pravastatin (PRAVACHOL) 80 MG tablet, Take 1 tablet (80 mg total) by mouth every evening., Disp: 90 tablet, Rfl: 1 .  TRUE METRIX BLOOD GLUCOSE TEST test strip, USE  AS  INSTRUCTED, Disp: 300 each, Rfl: 1 .  TRUEPLUS LANCETS 33G MISC, 1 each by Does not apply route 3 (three) times daily., Disp: 300 each, Rfl: 2 .  metFORMIN (GLUCOPHAGE-XR) 500 MG 24 hr tablet, Take 2 tablets (1,000 mg total) by mouth daily with breakfast. (Patient not taking: Reported on 05/08/2019), Disp: 180 tablet, Rfl: 0  No Known Allergies  I  personally reviewed active problem list, medication list, allergies, family history, social history, health maintenance with the patient/caregiver today.   ROS  Constitutional: Negative for fever or weight change.  Respiratory: Negative for cough , positive for intermittent  shortness of breath.   Cardiovascular: Negative for chest pain or palpitations.  Gastrointestinal: Negative for abdominal pain, no bowel changes.  Musculoskeletal: Positive  for gait problem but no   joint swelling.  Skin: Negative for rash.  Neurological: Negative for dizziness or headache.  No other specific complaints in a complete review of systems (except as listed in HPI above).  Objective  Virtual encounter,vitals from home   Today's Vitals   05/08/19 0731  BP: 131/66  Pulse: (!) 59  PainSc: 4    There is no height or weight on file to calculate BMI.  Physical Exam  Awake, alert and oriented  PHQ2/9: Depression screen [facility] 2/9 05/08/2019 01/01/2019 04/25/2018 08/24/2017 03/25/2017  Decreased Interest 0 0 0 0 0  Down, Depressed, Hopeless 0 0 0 0 0  PHQ - 2 Score 0 0 0 0 0  Altered sleeping 0 0 - - -  Tired, decreased energy 0 0 - - -  Change in appetite 0 0 - - -  Feeling bad or failure about yourself  0 0 - - -  Trouble concentrating 0 0 - - -  Moving slowly or fidgety/restless 0 0 - - -  Suicidal thoughts 0 0 - - -  PHQ-9 Score 0 0 - - -  Difficult doing work/chores - Not difficult at all - - -   PHQ-2/9 Result is negative.    Fall Risk: Fall Risk  05/08/2019 01/01/2019 08/30/2018 11/25/2017 08/24/2017  Falls in the past year? 0 0 0 No No  Number falls in past yr: 0 0 0 - -  Injury with Fall? 0 0 0 - -  Follow up - Falls evaluation completed - - -     Assessment & Plan  1. Controlled type 2 diabetes mellitus with microalbuminuria, with long-term current use of insulin (HCC)  - Hemoglobin A1c - COMPLETE METABOLIC PANEL WITH GFR - Microalbumin / creatinine urine ratio - Insulin Lispro Prot &  Lispro (HUMALOG MIX 75/25 KWIKPEN) (75-25) 100 UNIT/ML Kwikpen; Inject 38-60 Units into the skin daily.  Dispense: 15 mL; Refill: 0  2. Benign essential hypertension  - COMPLETE METABOLIC PANEL WITH GFR - CBC with Differential/Platelet - irbesartan (AVAPRO) 300 MG tablet; Take 1 tablet (300 mg total) by mouth daily.  Dispense: 90 tablet; Refill: 1  3. Hypertensive heart disease with heart failure (HCC)  - furosemide (LASIX) 40 MG tablet; Take 1 tablet (40 mg total) by mouth 2 (two) times daily as needed.  Dispense: 180 tablet; Refill: 1  4. Atrial fibrillation, unspecified type (HCC)  Continue Eliquis   5. Thoracic aortic atherosclerosis ([place])  Recheck labs   6. Chronic kidney disease (CKD), stage III (moderate) (HCC)  - COMPLETE METABOLIC PANEL WITH GFR - Parathyroid hormone, intact (no Ca) - VITAMIN D 25 Hydroxy (Vit-D Deficiency, Fractures)  7. Dyslipidemia associated with type 2 diabetes mellitus (HCC)  - Lipid panel - pravastatin (PRAVACHOL) 80 MG tablet; Take 1 tablet (80 mg total) by mouth every evening.  Dispense: 90 tablet; Refill: 1  8. Anemia, unspecified type  - CBC with Differential/Platelet - Iron, TIBC and Ferritin Panel  9. Controlled gout  - allopurinol (ZYLOPRIM) 100 MG tablet; Take 1 tablet (100 mg total) by mouth 2 (two) times daily.  Dispense: 180 tablet; Refill: 1  10. Moderate persistent asthma without complication  - Fluticasone-Salmeterol (ADVAIR DISKUS) 250-50 MCG/DOSE AEPB; Inhale 1 puff into the lungs 2 (two) times daily.  Dispense: 3 each; Refill: 1  11. Chronic pain of left knee  Discussed referral to Ortho   I discussed the assessment and treatment plan with the patient. The patient was provided an opportunity to ask questions and all were answered. The patient agreed with the plan and demonstrated an understanding of the instructions.  The patient was advised to call back or seek an in-person evaluation if the symptoms worsen or if the  condition fails to improve as anticipated.  I provided 25  minutes of non-face-to-face time during this encounter.

## 2019-05-10 ENCOUNTER — Other Ambulatory Visit: Payer: Self-pay | Admitting: Family Medicine

## 2019-05-10 DIAGNOSIS — Z794 Long term (current) use of insulin: Secondary | ICD-10-CM

## 2019-05-10 DIAGNOSIS — E1129 Type 2 diabetes mellitus with other diabetic kidney complication: Secondary | ICD-10-CM

## 2019-05-14 ENCOUNTER — Other Ambulatory Visit: Payer: Self-pay

## 2019-05-14 ENCOUNTER — Ambulatory Visit (INDEPENDENT_AMBULATORY_CARE_PROVIDER_SITE_OTHER): Payer: Medicare PPO

## 2019-05-14 VITALS — Wt 391.0 lb

## 2019-05-14 DIAGNOSIS — E785 Hyperlipidemia, unspecified: Secondary | ICD-10-CM | POA: Diagnosis not present

## 2019-05-14 DIAGNOSIS — R809 Proteinuria, unspecified: Secondary | ICD-10-CM | POA: Diagnosis not present

## 2019-05-14 DIAGNOSIS — Z23 Encounter for immunization: Secondary | ICD-10-CM | POA: Diagnosis not present

## 2019-05-14 DIAGNOSIS — N183 Chronic kidney disease, stage 3 (moderate): Secondary | ICD-10-CM | POA: Diagnosis not present

## 2019-05-14 DIAGNOSIS — I10 Essential (primary) hypertension: Secondary | ICD-10-CM | POA: Diagnosis not present

## 2019-05-14 DIAGNOSIS — Z794 Long term (current) use of insulin: Secondary | ICD-10-CM | POA: Diagnosis not present

## 2019-05-14 DIAGNOSIS — E1129 Type 2 diabetes mellitus with other diabetic kidney complication: Secondary | ICD-10-CM | POA: Diagnosis not present

## 2019-05-14 DIAGNOSIS — E1169 Type 2 diabetes mellitus with other specified complication: Secondary | ICD-10-CM | POA: Diagnosis not present

## 2019-05-14 DIAGNOSIS — D649 Anemia, unspecified: Secondary | ICD-10-CM | POA: Diagnosis not present

## 2019-05-15 LAB — VITAMIN D 25 HYDROXY (VIT D DEFICIENCY, FRACTURES): Vit D, 25-Hydroxy: 48 ng/mL (ref 30–100)

## 2019-05-15 LAB — CBC WITH DIFFERENTIAL/PLATELET
Absolute Monocytes: 632 cells/uL (ref 200–950)
Basophils Absolute: 56 cells/uL (ref 0–200)
Basophils Relative: 0.6 %
Eosinophils Absolute: 251 cells/uL (ref 15–500)
Eosinophils Relative: 2.7 %
HCT: 31.9 % — ABNORMAL LOW (ref 35.0–45.0)
Hemoglobin: 10.2 g/dL — ABNORMAL LOW (ref 11.7–15.5)
Lymphs Abs: 1609 cells/uL (ref 850–3900)
MCH: 28.7 pg (ref 27.0–33.0)
MCHC: 32 g/dL (ref 32.0–36.0)
MCV: 89.6 fL (ref 80.0–100.0)
MPV: 11.9 fL (ref 7.5–12.5)
Monocytes Relative: 6.8 %
Neutro Abs: 6752 cells/uL (ref 1500–7800)
Neutrophils Relative %: 72.6 %
Platelets: 227 10*3/uL (ref 140–400)
RBC: 3.56 10*6/uL — ABNORMAL LOW (ref 3.80–5.10)
RDW: 14.5 % (ref 11.0–15.0)
Total Lymphocyte: 17.3 %
WBC: 9.3 10*3/uL (ref 3.8–10.8)

## 2019-05-15 LAB — COMPLETE METABOLIC PANEL WITH GFR
AG Ratio: 1.4 (calc) (ref 1.0–2.5)
ALT: 5 U/L — ABNORMAL LOW (ref 6–29)
AST: 11 U/L (ref 10–35)
Albumin: 3.8 g/dL (ref 3.6–5.1)
Alkaline phosphatase (APISO): 88 U/L (ref 37–153)
BUN/Creatinine Ratio: 24 (calc) — ABNORMAL HIGH (ref 6–22)
BUN: 33 mg/dL — ABNORMAL HIGH (ref 7–25)
CO2: 28 mmol/L (ref 20–32)
Calcium: 8.8 mg/dL (ref 8.6–10.4)
Chloride: 105 mmol/L (ref 98–110)
Creat: 1.35 mg/dL — ABNORMAL HIGH (ref 0.60–0.93)
GFR, Est African American: 46 mL/min/{1.73_m2} — ABNORMAL LOW (ref >=60)
GFR, Est Non African American: 39 mL/min/{1.73_m2} — ABNORMAL LOW (ref >=60)
Globulin: 2.8 g/dL (calc) (ref 1.9–3.7)
Glucose, Bld: 147 mg/dL — ABNORMAL HIGH (ref 65–99)
Potassium: 4.7 mmol/L (ref 3.5–5.3)
Sodium: 141 mmol/L (ref 135–146)
Total Bilirubin: 0.5 mg/dL (ref 0.2–1.2)
Total Protein: 6.6 g/dL (ref 6.1–8.1)

## 2019-05-15 LAB — LIPID PANEL
Cholesterol: 117 mg/dL (ref <200)
HDL: 45 mg/dL — ABNORMAL LOW (ref >=50)
LDL Cholesterol (Calc): 56 mg/dL (calc)
Non-HDL Cholesterol (Calc): 72 mg/dL (calc) (ref <130)
Total CHOL/HDL Ratio: 2.6 (calc) (ref <5.0)
Triglycerides: 76 mg/dL (ref <150)

## 2019-05-15 LAB — IRON,TIBC AND FERRITIN PANEL
%SAT: 17 % (calc) (ref 16–45)
Ferritin: 131 ng/mL (ref 16–288)
Iron: 49 ug/dL (ref 45–160)
TIBC: 285 mcg/dL (calc) (ref 250–450)

## 2019-05-15 LAB — MICROALBUMIN / CREATININE URINE RATIO
Creatinine, Urine: 69 mg/dL (ref 20–275)
Microalb Creat Ratio: 78 mcg/mg creat — ABNORMAL HIGH (ref <30)
Microalb, Ur: 5.4 mg/dL

## 2019-05-15 LAB — PARATHYROID HORMONE, INTACT (NO CA): PTH: 97 pg/mL — ABNORMAL HIGH (ref 14–64)

## 2019-05-15 LAB — HEMOGLOBIN A1C
Hgb A1c MFr Bld: 6.8 % of total Hgb — ABNORMAL HIGH (ref <5.7)
Mean Plasma Glucose: 148 (calc)
eAG (mmol/L): 8.2 (calc)

## 2019-05-24 DIAGNOSIS — G4733 Obstructive sleep apnea (adult) (pediatric): Secondary | ICD-10-CM | POA: Diagnosis not present

## 2019-06-15 ENCOUNTER — Other Ambulatory Visit: Payer: Self-pay

## 2019-06-15 ENCOUNTER — Ambulatory Visit (INDEPENDENT_AMBULATORY_CARE_PROVIDER_SITE_OTHER): Payer: Medicare PPO

## 2019-06-15 VITALS — BP 135/62 | Ht 70.0 in | Wt 391.0 lb

## 2019-06-15 DIAGNOSIS — Z0000 Encounter for general adult medical examination without abnormal findings: Secondary | ICD-10-CM | POA: Diagnosis not present

## 2019-06-15 NOTE — Patient Instructions (Signed)
Amber Reyes , Thank you for taking time to come for your Medicare Wellness Visit. I appreciate your ongoing commitment to your health goals. Please review the following plan we discussed and let me know if I can assist you in the future.   Screening recommendations/referrals: Colonoscopy: done 04/13/19. Repeat in 2021. Mammogram: done 10/26/18 Bone Density: due Recommended yearly ophthalmology/optometry visit for glaucoma screening and checkup Recommended yearly dental visit for hygiene and checkup  Vaccinations: Influenza vaccine: done 05/14/19 Pneumococcal vaccine: done 06/04/16 Tdap vaccine: done 09/29/12 Shingles vaccine: Shingrix discussed. Please contact your pharmacy for coverage information.   Advanced directives: Advance directive discussed with you today. I have provided a copy for you to complete at home and have notarized. Once this is complete please bring a copy in to our office so we can scan it into your chart.  Conditions/risks identified: Recommend healthy eating and physical activity for desired weight loss.   Next appointment: Please follow up in one year for your Medicare Annual Wellness visit.     Preventive Care 2 Years and Older, Female Preventive care refers to lifestyle choices and visits with your health care provider that can promote health and wellness. What does preventive care include?  A yearly physical exam. This is also called an annual well check.  Dental exams once or twice a year.  Routine eye exams. Ask your health care provider how often you should have your eyes checked.  Personal lifestyle choices, including:  Daily care of your teeth and gums.  Regular physical activity.  Eating a healthy diet.  Avoiding tobacco and drug use.  Limiting alcohol use.  Practicing safe sex.  Taking low-dose aspirin every day.  Taking vitamin and mineral supplements as recommended by your health care provider. What happens during an annual well check?  The services and screenings done by your health care provider during your annual well check will depend on your age, overall health, lifestyle risk factors, and family history of disease. Counseling  Your health care provider may ask you questions about your:  Alcohol use.  Tobacco use.  Drug use.  Emotional well-being.  Home and relationship well-being.  Sexual activity.  Eating habits.  History of falls.  Memory and ability to understand (cognition).  Work and work Statistician.  Reproductive health. Screening  You may have the following tests or measurements:  Height, weight, and BMI.  Blood pressure.  Lipid and cholesterol levels. These may be checked every 5 years, or more frequently if you are over 66 years old.  Skin check.  Lung cancer screening. You may have this screening every year starting at age 27 if you have a 30-pack-year history of smoking and currently smoke or have quit within the past 15 years.  Fecal occult blood test (FOBT) of the stool. You may have this test every year starting at age 59.  Flexible sigmoidoscopy or colonoscopy. You may have a sigmoidoscopy every 5 years or a colonoscopy every 10 years starting at age 79.  Hepatitis C blood test.  Hepatitis B blood test.  Sexually transmitted disease (STD) testing.  Diabetes screening. This is done by checking your blood sugar (glucose) after you have not eaten for a while (fasting). You may have this done every 1-3 years.  Bone density scan. This is done to screen for osteoporosis. You may have this done starting at age 67.  Mammogram. This may be done every 1-2 years. Talk to your health care provider about how often you should have  regular mammograms. Talk with your health care provider about your test results, treatment options, and if necessary, the need for more tests. Vaccines  Your health care provider may recommend certain vaccines, such as:  Influenza vaccine. This is  recommended every year.  Tetanus, diphtheria, and acellular pertussis (Tdap, Td) vaccine. You may need a Td booster every 10 years.  Zoster vaccine. You may need this after age 28.  Pneumococcal 13-valent conjugate (PCV13) vaccine. One dose is recommended after age 55.  Pneumococcal polysaccharide (PPSV23) vaccine. One dose is recommended after age 57. Talk to your health care provider about which screenings and vaccines you need and how often you need them. This information is not intended to replace advice given to you by your health care provider. Make sure you discuss any questions you have with your health care provider. Document Released: 09/05/2015 Document Revised: 04/28/2016 Document Reviewed: 06/10/2015 Elsevier Interactive Patient Education  2017 [place] Prevention in the Home Falls can cause injuries. They can happen to people of all ages. There are many things you can do to make your home safe and to help prevent falls. What can I do on the outside of my home?  Regularly fix the edges of walkways and driveways and fix any cracks.  Remove anything that might make you trip as you walk through a door, such as a raised step or threshold.  Trim any bushes or trees on the path to your home.  Use bright outdoor lighting.  Clear any walking paths of anything that might make someone trip, such as rocks or tools.  Regularly check to see if handrails are loose or broken. Make sure that both sides of any steps have handrails.  Any raised decks and porches should have guardrails on the edges.  Have any leaves, snow, or ice cleared regularly.  Use sand or salt on walking paths during winter.  Clean up any spills in your garage right away. This includes oil or grease spills. What can I do in the bathroom?  Use night lights.  Install grab bars by the toilet and in the tub and shower. Do not use towel bars as grab bars.  Use non-skid mats or decals in the tub or  shower.  If you need to sit down in the shower, use a plastic, non-slip stool.  Keep the floor dry. Clean up any water that spills on the floor as soon as it happens.  Remove soap buildup in the tub or shower regularly.  Attach bath mats securely with double-sided non-slip rug tape.  Do not have throw rugs and other things on the floor that can make you trip. What can I do in the bedroom?  Use night lights.  Make sure that you have a light by your bed that is easy to reach.  Do not use any sheets or blankets that are too big for your bed. They should not hang down onto the floor.  Have a firm chair that has side arms. You can use this for support while you get dressed.  Do not have throw rugs and other things on the floor that can make you trip. What can I do in the kitchen?  Clean up any spills right away.  Avoid walking on wet floors.  Keep items that you use a lot in easy-to-reach places.  If you need to reach something above you, use a strong step stool that has a grab bar.  Keep electrical cords out of the  way.  Do not use floor polish or wax that makes floors slippery. If you must use wax, use non-skid floor wax.  Do not have throw rugs and other things on the floor that can make you trip. What can I do with my stairs?  Do not leave any items on the stairs.  Make sure that there are handrails on both sides of the stairs and use them. Fix handrails that are broken or loose. Make sure that handrails are as long as the stairways.  Check any carpeting to make sure that it is firmly attached to the stairs. Fix any carpet that is loose or worn.  Avoid having throw rugs at the top or bottom of the stairs. If you do have throw rugs, attach them to the floor with carpet tape.  Make sure that you have a light switch at the top of the stairs and the bottom of the stairs. If you do not have them, ask someone to add them for you. What else can I do to help prevent falls?   Wear shoes that:  Do not have high heels.  Have rubber bottoms.  Are comfortable and fit you well.  Are closed at the toe. Do not wear sandals.  If you use a stepladder:  Make sure that it is fully opened. Do not climb a closed stepladder.  Make sure that both sides of the stepladder are locked into place.  Ask someone to hold it for you, if possible.  Clearly mark and make sure that you can see:  Any grab bars or handrails.  First and last steps.  Where the edge of each step is.  Use tools that help you move around (mobility aids) if they are needed. These include:  Canes.  Walkers.  Scooters.  Crutches.  Turn on the lights when you go into a dark area. Replace any light bulbs as soon as they burn out.  Set up your furniture so you have a clear path. Avoid moving your furniture around.  If any of your floors are uneven, fix them.  If there are any pets around you, be aware of where they are.  Review your medicines with your doctor. Some medicines can make you feel dizzy. This can increase your chance of falling. Ask your doctor what other things that you can do to help prevent falls. This information is not intended to replace advice given to you by your health care provider. Make sure you discuss any questions you have with your health care provider. Document Released: 06/05/2009 Document Revised: 01/15/2016 Document Reviewed: 09/13/2014 Elsevier Interactive Patient Education  2017 [employer].

## 2019-06-15 NOTE — Progress Notes (Signed)
Subjective:   Amber Reyes is a 73 y.o. female who presents for an Initial Medicare Annual Wellness Visit.  Virtual Visit via Telephone Note  I connected with Amber Reyes on 06/15/19 at  8:00 AM EDT by telephone and verified that I am speaking with the correct person using two identifiers.  Medicare Annual Wellness visit completed telephonically due to Covid-19 pandemic.   Location: Patient: home Provider: office   I discussed the limitations, risks, security and privacy concerns of performing an evaluation and management service by telephone and the availability of in person appointments. The patient expressed understanding and agreed to proceed.  Some vital signs may be absent or patient reported.   Clemetine Marker, LPN    Review of Systems      Cardiac Risk Factors include: advanced age (>58mn, >>48women);diabetes mellitus;dyslipidemia;hypertension;obesity (BMI >30kg/m2);sedentary lifestyle     Objective:    Today's Vitals   06/15/19 0817  BP: 135/62  Weight: (!) 391 lb (177.4 kg)  Height: _0  (1.778 m)  PainSc: 3    Body mass index is 56.1 kg/m.  Advanced Directives 06/15/2019 04/13/2019 09/25/2018 03/25/2017 12/30/2016 12/23/2016 12/09/2016  Does Patient Have a Medical Advance Directive? _1  No No  Would patient like information on creating a medical advance directive? Yes (MAU/Ambulatory/Procedural Areas - Information given) No - Patient declined - - - - -    Current Medications (verified) Outpatient Encounter Medications as of 06/15/2019  Medication Sig  . albuterol (PROVENTIL HFA;VENTOLIN HFA) 108 (90 BASE) MCG/ACT inhaler Inhale 2 puffs into the lungs every 6 (six) hours as needed for wheezing or shortness of breath.  . allopurinol (ZYLOPRIM) 100 MG tablet Take 1 tablet (100 mg total) by mouth 2 (two) times daily.  .Marland Kitchenapixaban (ELIQUIS) 5 MG TABS tablet Take 1 tablet (5 mg total) by mouth 2 (two) times daily.  . Blood Glucose Calibration  (TRUE METRIX LEVEL 1) Low SOLN 1 each by In Vitro route once a week.  . Blood Glucose Monitoring Suppl (TRUE METRIX AIR GLUCOSE METER) w/Device KIT Inject 1 each into the skin 4 (four) times daily as needed. Check fsbs 4 times daily E11.22  . Cholecalciferol (VITAMIN D) 2000 UNITS tablet Take 2,000 Units by mouth daily.  . colchicine (COLCRYS) 0.6 MG tablet Take 1-2 tablets (0.6-1.2 mg total) by mouth daily as needed.  . diltiazem (CARDIZEM CD) 300 MG 24 hr capsule Take 1 capsule (300 mg total) by mouth daily.  . Dulaglutide (TRULICITY) 1.5 MUM/3.5TISOPN Inject 1.5 mg into the skin once a week.  .Regino SchultzeBandages & Supports (MEDICAL COMPRESSION STOCKINGS) MISC 2 each by Does not apply route daily.  . fluticasone (FLONASE) 50 MCG/ACT nasal spray USE 2 SPRAYS IN EACH NOSTRIL EVERY DAY  . Fluticasone-Salmeterol (ADVAIR DISKUS) 250-50 MCG/DOSE AEPB Inhale 1 puff into the lungs 2 (two) times daily.  . furosemide (LASIX) 40 MG tablet Take 1 tablet (40 mg total) by mouth 2 (two) times daily as needed.  . Insulin Lispro Prot & Lispro (HUMALOG MIX 75/25 KWIKPEN) (75-25) 100 UNIT/ML Kwikpen Inject 38-60 Units into the skin daily.  . Insulin Pen Needle (NOVOFINE) 30G X 8 MM MISC Inject 10 each into the skin daily.  . irbesartan (AVAPRO) 300 MG tablet Take 1 tablet (300 mg total) by mouth daily.  . pravastatin (PRAVACHOL) 80 MG tablet Take 1 tablet (80 mg total) by mouth every evening.  . TRUE METRIX BLOOD GLUCOSE TEST test strip USE  AS  INSTRUCTED  . TRUEPLUS LANCETS 33G MISC 1 each by Does not apply route 3 (three) times daily.  . [DISCONTINUED] fluconazole (DIFLUCAN) 150 MG tablet TAKE ONE TABLET EVERY OTHER DAY AS NEEDED FOR  YEAST  INFECTION   No facility-administered encounter medications on file as of 06/15/2019.     Allergies (verified) Patient has no known allergies.   History: Past Medical History:  Diagnosis Date  . Allergic rhinitis, cause unspecified   . Anxiety   . Chronic diastolic  CHF (congestive heart failure) ([place])   . CKD (chronic kidney disease), stage III   . Edema   . Essential hypertension, benign   . Gout, unspecified   . Heart murmur    as child  . Hyperlipidemia   . Lipoma of unspecified site   . Other and unspecified disc disorder of lumbar region   . Other general symptoms(780.99)   . PAC (premature atrial contraction)   . Renal insufficiency   . Restless legs syndrome (RLS)   . Sebaceous cyst   . Super obesity   . Type II or unspecified type diabetes mellitus without mention of complication, uncontrolled   . Unspecified asthma(493.90)   . Unspecified disease of pericardium   . Unspecified sleep apnea   . Unspecified vitamin D deficiency   . Vaginitis and vulvovaginitis, unspecified   . Venous insufficiency    Past Surgical History:  Procedure Laterality Date  . CARDIAC CATHETERIZATION  2002   [facility]  . COLONOSCOPY WITH PROPOFOL N/A 09/25/2018   Procedure: COLONOSCOPY WITH PROPOFOL;  Surgeon: Jonathon Bellows, MD;  Location[facility] ENDOSCOPY;  Service: Gastroenterology;  Laterality: N/A;  . COLONOSCOPY WITH PROPOFOL N/A 04/13/2019   Procedure: COLONOSCOPY WITH PROPOFOL;  Surgeon: Jonathon Bellows, MD;  Location: [facility] ENDOSCOPY;  Service: Gastroenterology;  Laterality: N/A;  . PERICARDIUM SURGERY     Family History  Problem Relation Age of Onset  . Heart attack Mother   . Hypertension Mother   . Breast cancer Neg Hx    Social History   Socioeconomic History  . Marital status: Married    Spouse name: Not on file  . Number of children: 3  . Years of education: Not on file  . Highest education level: Associate degree: academic program  Occupational History  . Occupation: retired  Scientific laboratory technician  . Financial resource strain: Not hard at all  . Food insecurity    Worry: Never true    Inability: Never true  . Transportation needs    Medical: No    Non-medical: No  Tobacco Use  . Smoking status: Never Smoker  . Smokeless tobacco: Never Used  .  Tobacco comment: n/a  Substance and Sexual Activity  . Alcohol use: No    Alcohol/week: 0.0 standard drinks  . Drug use: No  . Sexual activity: Not Currently  Lifestyle  . Physical activity    Days per week: 0 days    Minutes per session: 0 min  . Stress: Only a little  Relationships  . Social connections    Talks on phone: More than three times a week    Gets together: Once a week    Attends religious service: More than 4 times per year    Active member of club or organization: Yes    Attends meetings of clubs or organizations: More than 4 times per year    Relationship status: Married  Other Topics Concern  . Not on file  Social History Narrative  . Not on  file    Tobacco Counseling Counseling given: Not Answered Comment: n/a   Clinical Intake:  Pre-visit preparation completed: Yes  Pain : 0-10 Pain Score: 3  Pain Type: Chronic pain Pain Location: Back Pain Orientation: Lower Pain Descriptors / Indicators: Aching, Discomfort, Sore Pain Onset: More than a month ago Pain Frequency: Constant     Nutritional Status: BMI > 30  Obese Nutritional Risks: None Diabetes: Yes CBG done?: No Did pt. bring in CBG monitor from home?: No   Nutrition Risk Assessment:  Has the patient had any N/V/D within the last 2 months?  No  Does the patient have any non-healing wounds?  No  Has the patient had any unintentional weight loss or weight gain?  No   Diabetes:  Is the patient diabetic?  Yes  If diabetic, was a CBG obtained today?  No  Did the patient bring in their glucometer from home?  No  How often do you monitor your CBG's? Twice daily.   Financial Strains and Diabetes Management:  Are you having any financial strains with the device, your supplies or your medication? Yes .  Does the patient want to be seen by Chronic Care Management for management of their diabetes?  Yes  - already enrolled Would the patient like to be referred to a Nutritionist or for Diabetic  Management?  No   Diabetic Exams:  Diabetic Eye Exam: Completed 09/02/17 negative retinopathy. Overdue for diabetic eye exam. Pt has been advised about the importance in completing this exam. Pt was scheduled for exam earlier this year but it was postponed due to Covid. Pt aware to call and rescheduled.   Diabetic Foot Exam: Completed 04/25/18. Pt has been advised about the importance in completing this exam. Pt is scheduled for diabetic foot exam on 10/08/19.   How often do you need to have someone help you when you read instructions, pamphlets, or other written materials from your doctor or pharmacy?: 1 - Never  Interpreter Needed?: No  Information entered by :: Clemetine Marker LPN   Activities of Daily Living In your present state of health, do you have any difficulty performing the following activities: 06/15/2019 05/08/2019  Hearing? N N  Comment declines hearing aids -  Vision? N N  Difficulty concentrating or making decisions? N N  Walking or climbing stairs? Y Y  Dressing or bathing? N N  Doing errands, shopping? Amber Reyes  Preparing Food and eating ? N -  Using the Toilet? N -  In the past six months, have you accidently leaked urine? N -  Do you have problems with loss of bowel control? N -  Managing your Medications? N -  Managing your Finances? N -  Housekeeping or managing your Housekeeping? N -  Some recent data might be hidden     Immunizations and Health Maintenance Immunization History  Administered Date(s) Administered  . Fluad Quad(high Dose 65+) 05/14/2019  . Influenza Split 05/16/2007  . Influenza, High Dose Seasonal PF 06/24/2017, 04/25/2018  . Influenza, Seasonal, Injecte, Preservative Fre 05/07/2010, 04/27/2011  . Influenza,inj,Quad PF,6+ Mos 08/02/2014, 04/15/2015, 05/22/2016  . Influenza-Unspecified 08/02/2014  . Pneumococcal Conjugate-13 08/02/2014  . Pneumococcal Polysaccharide-23 04/27/2011, 06/04/2016  . Tdap 09/29/2012  . Zoster 09/29/2012   Health  Maintenance Due  Topic Date Due  . OPHTHALMOLOGY EXAM  09/02/2018  . FOOT EXAM  04/26/2019    Patient Care Team: Steele Sizer, MD as PCP - General (Family Medicine) Wellington Hampshire, MD as Consulting Physician (Cardiology)  Mottl, Andrez Grime, MD as Referring Physician (Nephrology) Regal, Tamala Fothergill, DPM as Consulting Physician (Podiatry) Cathi Roan, [facility] (Pharmacist)  Indicate any recent Medical Services you may have received from other than [facility] providers in the past year (date may be approximate).     Assessment:   This is a routine wellness examination for Amber Reyes.  Hearing/Vision screen  Hearing Screening   _0  _1  _2  _3  _4  _5  _6  _7  _8   Right ear:           Left ear:           Comments: Pt denies hearing difficulty  Vision Screening Comments: Annual vision screenings at [facility] Dr. Michelene Heady   Dietary issues and exercise activities discussed: Current Exercise Habits: The patient does not participate in regular exercise at present, Exercise limited by: orthopedic condition(s);respiratory conditions(s)  Goals    . I'm now in the doughnut hole (pt-stated)     Current Barriers:  . financial  Pharmacist Clinical Goal(s): Over the next 14 days, Amber Reyes will provide the necessary supplementary documents (proof of out of pocket prescription expenditure, proof of household income) needed for medication assistance applications to CCM pharmacist.   Interventions: . CCM pharmacist will apply for medication assistance program for Eliquis made by [facility] and prescribed by Dr. Fletcher Anon; Advair made by [place] and prescribed by Dr. Ancil Boozer . Corrected [employer] assistance to include Humalog 75/25 Kwikpens . CCM RN CM collaborated with [facility] Pharmacist to report patients need for insulin prescription to be faxed/called in to [facility] . Updated 8/18: incoming call from patient regarding a letter she received over the weekend that [facility]  does not have her prescription for Humalog 75/25 . Updated: confirm [facility] has prescription . Updated 9/11: patient has confirmed shipment with [facility] of Humalog  Patient Self Care Activities:  Marland Kitchen Gather necessary documents needed to apply for medication assistance  Please see past updates related to this goal by clicking on the "Past Updates" button in the selected goal        Depression Screen PHQ 2/9 Scores 06/15/2019 05/08/2019 01/01/2019 04/25/2018 08/24/2017 03/25/2017 12/30/2016  PHQ - 2 Score 0 0 0 0 0 0 0  PHQ- 9 Score - 0 0 - - - -    Fall Risk Fall Risk  06/15/2019 05/08/2019 01/01/2019 08/30/2018 11/25/2017  Falls in the past year? 0 0 0 0 No  Number falls in past yr: 0 0 0 0 -  Injury with Fall? 0 0 0 0 -  Follow up Falls prevention discussed - Falls evaluation completed - -   FALL RISK PREVENTION PERTAINING TO THE HOME:  Any stairs in or around the home? Yes  If so, do they handrails? Yes   Home free of loose throw rugs in walkways, pet beds, electrical cords, etc? Yes  Adequate lighting in your home to reduce risk of falls? Yes   ASSISTIVE DEVICES UTILIZED TO PREVENT FALLS:  Life alert? No  Use of a cane, walker or w/c? Yes  Grab bars in the bathroom? Yes  Shower chair or bench in shower? Yes  Elevated toilet seat or a handicapped toilet? Yes   DME ORDERS:  DME order needed?  No   TIMED UP AND GO:  Was the test performed? No . Telephonic visit.    Education: Fall risk prevention has been discussed.  Intervention(s) required? No   Cognitive Function:     6CIT Screen 06/15/2019  What Year? 0 points  What month? 0 points  What time? 0 points  Count back from 20 0 points  Months in reverse 0 points  Repeat phrase 4 points  Total Score 4    Screening Tests Health Maintenance  Topic Date Due  . OPHTHALMOLOGY EXAM  09/02/2018  . FOOT EXAM  04/26/2019  . MAMMOGRAM  10/26/2019  . HEMOGLOBIN A1C  11/11/2019  . COLONOSCOPY  04/12/2020  .  TETANUS/TDAP  09/29/2022  . INFLUENZA VACCINE  Completed  . DEXA SCAN  Completed  . Hepatitis C Screening  Completed  . PNA vac Low Risk Adult  Completed    Qualifies for Shingles Vaccine? Yes  Zostavax completed 2014. Due for Shingrix. Education has been provided regarding the importance of this vaccine. Pt has been advised to call insurance company to determine out of pocket expense. Advised may also receive vaccine at local pharmacy or Health Dept. Verbalized acceptance and understanding.  Tdap: Up to date  Flu Vaccine: Up to date  Pneumococcal Vaccine: Up to date  Cancer Screenings:  Colorectal Screening: Completed 04/13/2019. Repeat every 1 years;   Mammogram: Completed 10/26/18. Repeat every year.  Bone Density: Completed 04/15/15. Results reflect unavailable. Repeat every 2 years. Pt declines repeat screening at this.   Lung Cancer Screening: (Low Dose CT Chest recommended if Age 34-80 years, 30 pack-year currently smoking OR have quit w/in 15years.) does not qualify.   Additional Screening:  Hepatitis C Screening: does qualify; Completed 04/02/13.  Vision Screening: Recommended annual ophthalmology exams for early detection of glaucoma and other disorders of the eye. Is the patient up to date with their annual eye exam?  No  postponed due to Covid Who is the provider or what is the name of the office in which the pt attends annual eye exams? [place] Screening: Recommended annual dental exams for proper oral hygiene  Community Resource Referral:  CRR required this visit?  No      Plan:    I have personally reviewed and addressed the Medicare Annual Wellness questionnaire and have noted the following in the patient's chart:  A. Medical and social history B. Use of alcohol, tobacco or illicit drugs  C. Current medications and supplements D. Functional ability and status E.  Nutritional status F.  Physical activity G. Advance directives H. List of  other physicians I.  Hospitalizations, surgeries, and ER visits in previous 12 months J.  [place] such as hearing and vision if needed, cognitive and depression L. Referrals and appointments   In addition, I have reviewed and discussed with patient certain preventive protocols, quality metrics, and best practice recommendations. A written personalized care plan for preventive services as well as general preventive health recommendations were provided to patient.   Signed,  Clemetine Marker, LPN Nurse Health Advisor   Nurse Notes: pt states financial difficulty with out of pocket cost for eliquis. Staff message sent to Ruben Reason CCM [facility] to follow up as they have already discussed this. She is otherwise doing well and appreciative of visit today.

## 2019-06-19 ENCOUNTER — Ambulatory Visit (INDEPENDENT_AMBULATORY_CARE_PROVIDER_SITE_OTHER): Payer: Medicare PPO | Admitting: Pharmacist

## 2019-06-19 DIAGNOSIS — I4891 Unspecified atrial fibrillation: Secondary | ICD-10-CM | POA: Diagnosis not present

## 2019-06-19 DIAGNOSIS — E1129 Type 2 diabetes mellitus with other diabetic kidney complication: Secondary | ICD-10-CM

## 2019-06-19 DIAGNOSIS — R809 Proteinuria, unspecified: Secondary | ICD-10-CM | POA: Diagnosis not present

## 2019-06-19 DIAGNOSIS — Z794 Long term (current) use of insulin: Secondary | ICD-10-CM | POA: Diagnosis not present

## 2019-06-20 NOTE — Chronic Care Management (AMB) (Signed)
Chronic Care Management   Follow Up Note   06/20/2019 Name: ABEGAIL KLOEPPEL MRN: [id number] DOB: 09/02/1945  Subjective Amber Reyes is a 73 y.o. year old female who is a primary care patient of Steele Sizer, MD. The CCM team was consulted for assistance with chronic disease management and care coordination needs.    Review of patient status, including review of consultants reports, relevant laboratory and other test results, and collaboration with appropriate care team members and the patient's provider was performed as part of comprehensive patient evaluation and provision of chronic care management services.      Outpatient Encounter Medications as of 06/19/2019  Medication Sig  . albuterol (PROVENTIL HFA;VENTOLIN HFA) 108 (90 BASE) MCG/ACT inhaler Inhale 2 puffs into the lungs every 6 (six) hours as needed for wheezing or shortness of breath.  . allopurinol (ZYLOPRIM) 100 MG tablet Take 1 tablet (100 mg total) by mouth 2 (two) times daily.  Marland Kitchen apixaban (ELIQUIS) 5 MG TABS tablet Take 1 tablet (5 mg total) by mouth 2 (two) times daily.  . Blood Glucose Calibration (TRUE METRIX LEVEL 1) Low SOLN 1 each by In Vitro route once a week.  . Blood Glucose Monitoring Suppl (TRUE METRIX AIR GLUCOSE METER) w/Device KIT Inject 1 each into the skin 4 (four) times daily as needed. Check fsbs 4 times daily E11.22  . Cholecalciferol (VITAMIN D) 2000 UNITS tablet Take 2,000 Units by mouth daily.  . colchicine (COLCRYS) 0.6 MG tablet Take 1-2 tablets (0.6-1.2 mg total) by mouth daily as needed.  . diltiazem (CARDIZEM CD) 300 MG 24 hr capsule Take 1 capsule (300 mg total) by mouth daily.  . Dulaglutide (TRULICITY) 1.71 YO/3.7CH SOPN Inject 1.5 mg into the skin once a week.  Regino Schultze Bandages & Supports (MEDICAL COMPRESSION STOCKINGS) MISC 2 each by Does not apply route daily.  . fluticasone (FLONASE) 50 MCG/ACT nasal spray USE 2 SPRAYS IN EACH NOSTRIL EVERY DAY  . Fluticasone-Salmeterol  (ADVAIR DISKUS) 250-50 MCG/DOSE AEPB Inhale 1 puff into the lungs 2 (two) times daily.  . furosemide (LASIX) 40 MG tablet Take 1 tablet (40 mg total) by mouth 2 (two) times daily as needed.  . Insulin Lispro Prot & Lispro (HUMALOG MIX 75/25 KWIKPEN) (75-25) 100 UNIT/ML Kwikpen Inject 38-60 Units into the skin daily.  . Insulin Pen Needle (NOVOFINE) 30G X 8 MM MISC Inject 10 each into the skin daily.  . irbesartan (AVAPRO) 300 MG tablet Take 1 tablet (300 mg total) by mouth daily.  . pravastatin (PRAVACHOL) 80 MG tablet Take 1 tablet (80 mg total) by mouth every evening.  . TRUE METRIX BLOOD GLUCOSE TEST test strip USE  AS  INSTRUCTED  . TRUEPLUS LANCETS 33G MISC 1 each by Does not apply route 3 (three) times daily.   No facility-administered encounter medications on file as of 06/19/2019.      Goals Addressed            This Visit's Progress   . I'm now in the doughnut hole (pt-stated)       Current Barriers:  . financial  Pharmacist Clinical Goal(s): Over the next 14 days, [name] will provide the necessary supplementary documents (proof of out of pocket prescription expenditure, proof of household income) needed for medication assistance applications to CCM pharmacist.   Interventions: . Updated 10/27: patient reports high copay for Eliquis; Dr. Reino Bellis office previously applied for assistance from [facility] but patient had not met out of pocket prescription cost; patient can  provide to date OOP spend and we can update application  o Also can re-apply for [place] for 2021 beginning in November  Patient Self Care Activities:  Marland Kitchen Gather necessary documents needed to apply for medication assistance  Please see past updates related to this goal by clicking on the "Past Updates" button in the selected goal         Plan/Follow up Telephone follow up appointment with care management team member scheduled for: 2 weeks or sooner pending patient turning in necessary supplemental  documents   Ruben Reason, PharmD Clinical Pharmacist [place] [phone]

## 2019-06-24 DIAGNOSIS — G4733 Obstructive sleep apnea (adult) (pediatric): Secondary | ICD-10-CM | POA: Diagnosis not present

## 2019-06-25 ENCOUNTER — Telehealth: Payer: Self-pay | Admitting: Cardiovascular Disease

## 2019-06-25 NOTE — Telephone Encounter (Signed)
Patient calling the office for samples of medication:   1.  What medication and dosage are you requesting samples for? Eliquis   2.  Are you currently out of this medication? Yes, been out since Friday  If no samples, please call [facility] on [address] with a small prescription to use while medication is shipped. Patient says [employer] [facility] will be sending, please review and resubmit if needed.

## 2019-06-25 NOTE — Telephone Encounter (Signed)
Medication Samples have been provided to the patient.  Drug name: Eliquis       Strength: 5mg         Qty: 14 [id number]  Exp.Date: 07/2021  Dosing instructions: one tablet PO BID  Rene Paci McClain 3:28 PM 06/25/2019

## 2019-07-02 ENCOUNTER — Telehealth: Payer: Self-pay | Admitting: Family Medicine

## 2019-07-02 NOTE — Telephone Encounter (Signed)
Copied from [place] [id number]. Topic: General - Other >> Jul 02, 2019 11:31 AM Rayann Heman wrote: Reason for CRM: pt called and stated that he has a form for medical assistance for medication. Pt would like to know if this has been finished and would like a call back from julie. Please advise

## 2019-07-03 ENCOUNTER — Encounter: Payer: Self-pay | Admitting: Pharmacist

## 2019-07-03 ENCOUNTER — Other Ambulatory Visit: Payer: Self-pay

## 2019-07-03 DIAGNOSIS — Z20822 Contact with and (suspected) exposure to covid-19: Secondary | ICD-10-CM

## 2019-07-05 LAB — NOVEL CORONAVIRUS, NAA: SARS-CoV-2, NAA: NOT DETECTED

## 2019-07-06 ENCOUNTER — Telehealth: Payer: Self-pay | Admitting: General Practice

## 2019-07-06 NOTE — Telephone Encounter (Signed)
Negative COVID results given. Patient results "NOT Detected." Caller expressed understanding. ° °

## 2019-07-17 ENCOUNTER — Other Ambulatory Visit: Payer: Self-pay

## 2019-07-17 DIAGNOSIS — E1129 Type 2 diabetes mellitus with other diabetic kidney complication: Secondary | ICD-10-CM

## 2019-07-17 DIAGNOSIS — R809 Proteinuria, unspecified: Secondary | ICD-10-CM

## 2019-07-18 MED ORDER — NOVOFINE 30G X 8 MM MISC: 1.0000 | Freq: Every day | 1 refills | Status: DC

## 2019-07-24 DIAGNOSIS — G4733 Obstructive sleep apnea (adult) (pediatric): Secondary | ICD-10-CM | POA: Diagnosis not present

## 2019-07-25 MED ORDER — NOVOFINE 30G X 8 MM MISC: 1.0000 | Freq: Every day | 1 refills | Status: DC

## 2019-07-25 NOTE — Addendum Note (Signed)
Addended by: Inda Coke on: 07/25/2019 12:59 PM   Modules accepted: Orders

## 2019-07-25 NOTE — Telephone Encounter (Signed)
Patient states she was advised by pharmacy that they never received RX.

## 2019-08-13 ENCOUNTER — Other Ambulatory Visit: Payer: Self-pay | Admitting: Family Medicine

## 2019-08-24 DIAGNOSIS — G4733 Obstructive sleep apnea (adult) (pediatric): Secondary | ICD-10-CM | POA: Diagnosis not present

## 2019-08-28 ENCOUNTER — Ambulatory Visit (INDEPENDENT_AMBULATORY_CARE_PROVIDER_SITE_OTHER): Payer: Medicare PPO | Admitting: Pharmacist

## 2019-08-28 DIAGNOSIS — Z794 Long term (current) use of insulin: Secondary | ICD-10-CM | POA: Diagnosis not present

## 2019-08-28 DIAGNOSIS — E1129 Type 2 diabetes mellitus with other diabetic kidney complication: Secondary | ICD-10-CM | POA: Diagnosis not present

## 2019-08-28 DIAGNOSIS — I110 Hypertensive heart disease with heart failure: Secondary | ICD-10-CM | POA: Diagnosis not present

## 2019-08-28 DIAGNOSIS — M109 Gout, unspecified: Secondary | ICD-10-CM

## 2019-08-28 DIAGNOSIS — I4891 Unspecified atrial fibrillation: Secondary | ICD-10-CM

## 2019-08-28 DIAGNOSIS — R809 Proteinuria, unspecified: Secondary | ICD-10-CM

## 2019-08-29 NOTE — Chronic Care Management (AMB) (Signed)
Chronic Care Management   Follow Up Note   08/29/2019 Name: YACHET MATTSON MRN: [id number] DOB: 1946/07/25  Subjective LATICA HOHMANN is a 74 y.o. year old female who is a primary care patient of Steele Sizer, MD. The CCM clinical pharmacist was consulted for assistance with chronic disease management and care coordination needs.  Successful telephone outreach today regarding 2021 medication assistance, HIPAA identifiers verified.  Review of patient status, including review of consultants reports, relevant laboratory and other test results, and collaboration with appropriate care team members and the patient's provider was performed as part of comprehensive patient evaluation and provision of chronic care management services.    Objective  Outpatient Encounter Medications as of 08/28/2019  Medication Sig  . albuterol (PROVENTIL HFA;VENTOLIN HFA) 108 (90 BASE) MCG/ACT inhaler Inhale 2 puffs into the lungs every 6 (six) hours as needed for wheezing or shortness of breath.  . allopurinol (ZYLOPRIM) 100 MG tablet Take 1 tablet (100 mg total) by mouth 2 (two) times daily.  Marland Kitchen apixaban (ELIQUIS) 5 MG TABS tablet Take 1 tablet (5 mg total) by mouth 2 (two) times daily.  . Blood Glucose Calibration (TRUE METRIX LEVEL 1) Low SOLN 1 each by In Vitro route once a week.  . Blood Glucose Monitoring Suppl (TRUE METRIX AIR GLUCOSE METER) w/Device KIT Inject 1 each into the skin 4 (four) times daily as needed. Check fsbs 4 times daily E11.22  . Cholecalciferol (VITAMIN D) 2000 UNITS tablet Take 2,000 Units by mouth daily.  . colchicine (COLCRYS) 0.6 MG tablet Take 1-2 tablets (0.6-1.2 mg total) by mouth daily as needed.  . diltiazem (CARDIZEM CD) 300 MG 24 hr capsule Take 1 capsule (300 mg total) by mouth daily.  . Dulaglutide (TRULICITY) 1.5 PY/1.9JK SOPN Inject 1.5 mg into the skin once a week.  Regino Schultze Bandages & Supports (MEDICAL COMPRESSION STOCKINGS) MISC 2 each by Does not apply route daily.   . fluticasone (FLONASE) 50 MCG/ACT nasal spray USE 2 SPRAYS IN EACH NOSTRIL EVERY DAY  . Fluticasone-Salmeterol (ADVAIR DISKUS) 250-50 MCG/DOSE AEPB Inhale 1 puff into the lungs 2 (two) times daily.  . furosemide (LASIX) 40 MG tablet Take 1 tablet (40 mg total) by mouth 2 (two) times daily as needed.  . Insulin Lispro Prot & Lispro (HUMALOG MIX 75/25 KWIKPEN) (75-25) 100 UNIT/ML Kwikpen Inject 38-60 Units into the skin daily.  . Insulin Pen Needle (NOVOFINE) 30G X 8 MM MISC Inject 10 each into the skin daily.  . irbesartan (AVAPRO) 300 MG tablet Take 1 tablet (300 mg total) by mouth daily.  . pravastatin (PRAVACHOL) 80 MG tablet Take 1 tablet (80 mg total) by mouth every evening.  . TRUE METRIX BLOOD GLUCOSE TEST test strip USE  AS  INSTRUCTED  . TRUEPLUS LANCETS 33G MISC 1 each by Does not apply route 3 (three) times daily.   No facility-administered encounter medications on file as of 08/28/2019.     Goals Addressed            This Visit's Progress   . COMPLETED: I'm now in the doughnut hole (pt-stated)       Current Barriers:  . financial  Pharmacist Clinical Goal(s): Over the next 14 days, [name] will provide the necessary supplementary documents (proof of out of pocket prescription expenditure, proof of household income) needed for medication assistance applications to CCM pharmacist.   Interventions: . Updated 10/27: patient reports high copay for Eliquis; Dr. Reino Bellis office previously applied for assistance from [employer] but  patient had not met out of pocket prescription cost; patient can provide to date OOP spend and we can update application  o Also can re-apply for [place] for 2021 beginning in November  Patient Self Care Activities:  Marland Kitchen Gather necessary documents needed to apply for medication assistance  Please see past updates related to this goal by clicking on the "Past Updates" button in the selected goal      . Medication Assistance 2021 (pt-stated)        Current Barriers:  . financial  Pharmacist Clinical Goal(s): Over the next 14 days, [name] will provide the necessary supplementary documents (proof of out of pocket prescription expenditure, proof of household income) needed for medication assistance applications to CCM pharmacist.   Interventions: . CCM pharmacist will apply for medication assistance program for Humalog 54/88, Trulicity made by [employer]. . Assessed medication list for other programs: o [place] gout program (allopurinol, colchicine) o Advair (has not met $600 OOP) o Eliquis (has not met 3% OOP)   Patient Self Care Activities:  Marland Kitchen Gather necessary documents needed to apply for medication assistance  Initial goal documentation        Plan Telephone follow up appointment with care management team member scheduled for: 2 weeks or sooner pending completed [employer] application  Ruben Reason, PharmD Clinical Pharmacist [facility] [phone]

## 2019-08-29 NOTE — Patient Instructions (Signed)
Goals Addressed            This Visit's Progress   . COMPLETED: I'm now in the doughnut hole (pt-stated)       Current Barriers:  . financial  Pharmacist Clinical Goal(s): Over the next 14 days, Amber Reyes will provide the necessary supplementary documents (proof of out of pocket prescription expenditure, proof of household income) needed for medication assistance applications to CCM pharmacist.   Interventions: . Updated 10/27: patient reports high copay for Eliquis; Dr. Reino Bellis office previously applied for assistance from [facility] but patient had not met out of pocket prescription cost; patient can provide to date OOP spend and we can update application  o Also can re-apply for [place] for 2021 beginning in November  Patient Self Care Activities:  Marland Kitchen Gather necessary documents needed to apply for medication assistance  Please see past updates related to this goal by clicking on the "Past Updates" button in the selected goal      . Medication Assistance 2021 (pt-stated)       Current Barriers:  . financial  Pharmacist Clinical Goal(s): Over the next 14 days, Amber Reyes will provide the necessary supplementary documents (proof of out of pocket prescription expenditure, proof of household income) needed for medication assistance applications to CCM pharmacist.   Interventions: . CCM pharmacist will apply for medication assistance program for Humalog 11/73, Trulicity made by [employer]. . Assessed medication list for other programs: o [place] gout program (allopurinol, colchicine) o Advair (has not met $600 OOP) o Eliquis (has not met 3% OOP)   Patient Self Care Activities:  Marland Kitchen Gather necessary documents needed to apply for medication assistance  Initial goal documentation

## 2019-08-29 NOTE — Telephone Encounter (Signed)
Received patient's prescription list, but after window to apply for Eliquis assistance closed. Will remain cognizant of OOP spending to qualify for Eliquis assistance in 2021.   Ruben Reason, PharmD Clinical Pharmacist [facility] [phone]

## 2019-09-04 ENCOUNTER — Ambulatory Visit: Payer: Self-pay | Admitting: Pharmacist

## 2019-09-04 DIAGNOSIS — E1129 Type 2 diabetes mellitus with other diabetic kidney complication: Secondary | ICD-10-CM

## 2019-09-04 NOTE — Chronic Care Management (AMB) (Signed)
  Chronic Care Management   Care Coordination Note  09/04/2019 Name: Amber Reyes MRN: [id number] DOB: April 16, 1946  Care Coordination: Ralph Leyden Cares application received for patient's portion   Goals Addressed            This Visit's Progress   . Medication Assistance 2021 (pt-stated)       Current Barriers:  . financial  Pharmacist Clinical Goal(s): Over the next 14 days, Amber Reyes will provide the necessary supplementary documents (proof of out of pocket prescription expenditure, proof of household income) needed for medication assistance applications to CCM pharmacist.   Interventions: . CCM pharmacist will apply for medication assistance program for Humalog 31/54, Trulicity made by [employer]. . Assessed medication list for other programs: o [place] gout program (allopurinol, colchicine) o Advair (has not met $600 OOP) o Eliquis (has not met 3% OOP)  . Updated 1/12: received patient portion of application, Dr. Ancil Boozer to complete her portion of application for Trulicity and Humulin mix   Patient Self Care Activities:  Marland Kitchen Gather necessary documents needed to apply for medication assistance  Please see past updates related to this goal by clicking on the "Past Updates" button in the selected goal          Follow up plan: Telephone follow up appointment with care management team member scheduled for: 2 weeks to follow up on application status  Ruben Reason, PharmD Clinical Pharmacist [place] [phone]

## 2019-09-07 ENCOUNTER — Ambulatory Visit: Payer: Self-pay | Admitting: Pharmacist

## 2019-09-07 DIAGNOSIS — E1129 Type 2 diabetes mellitus with other diabetic kidney complication: Secondary | ICD-10-CM | POA: Diagnosis not present

## 2019-09-07 DIAGNOSIS — Z794 Long term (current) use of insulin: Secondary | ICD-10-CM | POA: Diagnosis not present

## 2019-09-07 DIAGNOSIS — I110 Hypertensive heart disease with heart failure: Secondary | ICD-10-CM | POA: Diagnosis not present

## 2019-09-07 DIAGNOSIS — I4891 Unspecified atrial fibrillation: Secondary | ICD-10-CM | POA: Diagnosis not present

## 2019-09-07 DIAGNOSIS — R809 Proteinuria, unspecified: Secondary | ICD-10-CM | POA: Diagnosis not present

## 2019-09-07 NOTE — Chronic Care Management (AMB) (Signed)
  Chronic Care Management   Care Coordination Note  09/07/2019 Name: Amber Reyes MRN: [id number] DOB: 02-Oct-1945  Care coordination: submitted [employer] application for Humalog 43/15 mix and Trulicity.   Goals Addressed            This Visit's Progress   . Medication Assistance 2021 (pt-stated)       Current Barriers:  . financial  Pharmacist Clinical Goal(s): Over the next 14 days, [name] will provide the necessary supplementary documents (proof of out of pocket prescription expenditure, proof of household income) needed for medication assistance applications to CCM pharmacist.   Interventions: . CCM pharmacist will apply for medication assistance program for Humalog 40/08, Trulicity made by [employer]. . Assessed medication list for other programs: o [place] gout program (allopurinol, colchicine) o Advair (has not met $600 OOP) o Eliquis (has not met 3% OOP)  . Updated 1/12: received patient portion of application, Dr. Ancil Boozer to complete her portion of application for Trulicity and Humulin mix  . Updated 6/76: submitted application to [place], uploaded under media tab  Patient Self Care Activities:  Marland Kitchen Gather necessary documents needed to apply for medication assistance  Please see past updates related to this goal by clicking on the "Past Updates" button in the selected goal          Follow up plan: Telephone follow up appointment with care management team member scheduled for: monthly DM follow up  Ruben Reason, PharmD Clinical Pharmacist [place] [phone]

## 2019-09-07 NOTE — Patient Instructions (Signed)
Goals Addressed            This Visit's Progress   . Medication Assistance 2021 (pt-stated)       Current Barriers:  . financial  Pharmacist Clinical Goal(s): Over the next 14 days, [name] will provide the necessary supplementary documents (proof of out of pocket prescription expenditure, proof of household income) needed for medication assistance applications to CCM pharmacist.   Interventions: . CCM pharmacist will apply for medication assistance program for Humalog 17/91, Trulicity made by [employer]. . Assessed medication list for other programs: o [place] gout program (allopurinol, colchicine) o Advair (has not met $600 OOP) o Eliquis (has not met 3% OOP)  . Updated 1/12: received patient portion of application, Dr. Ancil Boozer to complete her portion of application for Trulicity and Humulin mix  . Updated 5/05: submitted application to [place], uploaded under media tab  Patient Self Care Activities:  Marland Kitchen Gather necessary documents needed to apply for medication assistance  Please see past updates related to this goal by clicking on the "Past Updates" button in the selected goal

## 2019-09-11 ENCOUNTER — Ambulatory Visit: Payer: Self-pay | Admitting: Pharmacist

## 2019-09-11 DIAGNOSIS — E1129 Type 2 diabetes mellitus with other diabetic kidney complication: Secondary | ICD-10-CM

## 2019-09-11 DIAGNOSIS — R809 Proteinuria, unspecified: Secondary | ICD-10-CM

## 2019-09-14 NOTE — Chronic Care Management (AMB) (Signed)
  Chronic Care Management   Care Coordination Note  09/14/2019 Name: Amber Reyes MRN: [id number] DOB: [birth year]  Care Coordination: Ralph Leyden Cares applications  Goals Addressed            This Visit's Progress   . Medication Assistance 2021 (pt-stated)       Current Barriers:  . financial  Pharmacist Clinical Goal(s): Over the next 14 days, [name] will provide the necessary supplementary documents (proof of out of pocket prescription expenditure, proof of household income) needed for medication assistance applications to CCM pharmacist.   Interventions: . CCM pharmacist will apply for medication assistance program for Humalog 83/50, Trulicity made by [employer]. . Assessed medication list for other programs: o [place] gout program (allopurinol, colchicine) o Advair (has not met $600 OOP) o Eliquis (has not met 3% OOP)  . Updated 1/12: received patient portion of application, Dr. Ancil Boozer to complete her portion of application for Trulicity and Humulin mix  . Updated 7/57: Trulicity and Humulin approvals   Patient Self Care Activities:  Marland Kitchen Gather necessary documents needed to apply for medication assistance  Please see past updates related to this goal by clicking on the "Past Updates" button in the selected goal          Follow up plan: The patient has been provided with contact information for the care management team and has been advised to call with any health related questions or concerns.   Ruben Reason, PharmD Clinical Pharmacist [facility] [phone]

## 2019-09-24 DIAGNOSIS — G4733 Obstructive sleep apnea (adult) (pediatric): Secondary | ICD-10-CM | POA: Diagnosis not present

## 2019-09-25 ENCOUNTER — Other Ambulatory Visit: Payer: Self-pay | Admitting: Family Medicine

## 2019-09-25 DIAGNOSIS — G4733 Obstructive sleep apnea (adult) (pediatric): Secondary | ICD-10-CM | POA: Diagnosis not present

## 2019-09-25 DIAGNOSIS — E1129 Type 2 diabetes mellitus with other diabetic kidney complication: Secondary | ICD-10-CM

## 2019-09-25 DIAGNOSIS — M109 Gout, unspecified: Secondary | ICD-10-CM

## 2019-09-25 DIAGNOSIS — I10 Essential (primary) hypertension: Secondary | ICD-10-CM

## 2019-09-25 DIAGNOSIS — E1169 Type 2 diabetes mellitus with other specified complication: Secondary | ICD-10-CM

## 2019-10-04 ENCOUNTER — Telehealth: Payer: Self-pay | Admitting: Cardiovascular Disease

## 2019-10-04 ENCOUNTER — Other Ambulatory Visit: Payer: Self-pay | Admitting: Family Medicine

## 2019-10-04 DIAGNOSIS — Z1231 Encounter for screening mammogram for malignant neoplasm of breast: Secondary | ICD-10-CM

## 2019-10-04 NOTE — Telephone Encounter (Signed)
Patient has been rescheduled for an in office appt with an EKG with Dr. Fletcher Anon on 11/15/19

## 2019-10-04 NOTE — Telephone Encounter (Signed)
Msg route to Dr. Fletcher Anon to advise.

## 2019-10-04 NOTE — Telephone Encounter (Signed)
Patient scheduled for virtual appt and wants to know if it is ok or if she needs in office for an ekg   Please advise

## 2019-10-04 NOTE — Telephone Encounter (Signed)
She needs an office visit with an EKG.

## 2019-10-08 ENCOUNTER — Ambulatory Visit (INDEPENDENT_AMBULATORY_CARE_PROVIDER_SITE_OTHER): Payer: Medicare PPO | Admitting: Family Medicine

## 2019-10-08 ENCOUNTER — Encounter: Payer: Self-pay | Admitting: Family Medicine

## 2019-10-08 ENCOUNTER — Other Ambulatory Visit: Payer: Self-pay

## 2019-10-08 VITALS — BP 130/60 | HR 77 | Temp 96.6°F | Resp 16 | Ht 70.0 in | Wt 387.6 lb

## 2019-10-08 DIAGNOSIS — I700 Atherosclerosis of aorta: Secondary | ICD-10-CM | POA: Diagnosis not present

## 2019-10-08 DIAGNOSIS — Z794 Long term (current) use of insulin: Secondary | ICD-10-CM | POA: Diagnosis not present

## 2019-10-08 DIAGNOSIS — E1129 Type 2 diabetes mellitus with other diabetic kidney complication: Secondary | ICD-10-CM | POA: Diagnosis not present

## 2019-10-08 DIAGNOSIS — I272 Pulmonary hypertension, unspecified: Secondary | ICD-10-CM

## 2019-10-08 DIAGNOSIS — G8929 Other chronic pain: Secondary | ICD-10-CM

## 2019-10-08 DIAGNOSIS — G4733 Obstructive sleep apnea (adult) (pediatric): Secondary | ICD-10-CM | POA: Diagnosis not present

## 2019-10-08 DIAGNOSIS — I10 Essential (primary) hypertension: Secondary | ICD-10-CM | POA: Diagnosis not present

## 2019-10-08 DIAGNOSIS — I110 Hypertensive heart disease with heart failure: Secondary | ICD-10-CM | POA: Diagnosis not present

## 2019-10-08 DIAGNOSIS — I4891 Unspecified atrial fibrillation: Secondary | ICD-10-CM

## 2019-10-08 DIAGNOSIS — E1169 Type 2 diabetes mellitus with other specified complication: Secondary | ICD-10-CM

## 2019-10-08 DIAGNOSIS — M25562 Pain in left knee: Secondary | ICD-10-CM

## 2019-10-08 DIAGNOSIS — M109 Gout, unspecified: Secondary | ICD-10-CM

## 2019-10-08 DIAGNOSIS — R809 Proteinuria, unspecified: Secondary | ICD-10-CM

## 2019-10-08 DIAGNOSIS — E785 Hyperlipidemia, unspecified: Secondary | ICD-10-CM

## 2019-10-08 DIAGNOSIS — J4540 Moderate persistent asthma, uncomplicated: Secondary | ICD-10-CM

## 2019-10-08 LAB — POCT GLYCOSYLATED HEMOGLOBIN (HGB A1C): Hemoglobin A1C: 6.7 % — AB (ref 4.0–5.6)

## 2019-10-08 MED ORDER — FLUTICASONE-SALMETEROL 250-50 MCG/DOSE IN AEPB: 1.0000 | INHALATION_SPRAY | Freq: Two times a day (BID) | RESPIRATORY_TRACT | 1 refills | Status: DC

## 2019-10-08 NOTE — Progress Notes (Signed)
Name: Amber Reyes   MRN: [id number]    DOB: 05-15-1946   Date:10/08/2019       Progress Note  Subjective  Chief Complaint  Chief Complaint  Patient presents with  . Follow-up    5 month follow up    HPI  DMII controlled: She has been compliant with medication,she wason Humalog 75/17 Oct 2016, also started on Trulicity in 5146 and [id number] been at [facility] hgbA1Cwas 6.5% today it was 6.7% Taking Avapro for nephropathy, still has some  microalbuminuria, last level 78 .No polyphagia, polydipsia or polyuria.. Fasting in the 79-120,  she is down to  50units in am and 20units at night.Lipid panel improved   HTN/CHF/Afib: taking medications and denies side effects of medications, bp is at goal, she denies chest painno recentpalpitation, found to have Afib on EKG done by Dr. Fletcher Anon - she was not wearing CPAP consistently at the time, but has been very compliant since. She has orthopnea - uses two pillows , SOB is stable, and edema has been stable. Echo done 11/2019LV function is normaland unable to assess diastolic dysfunction, severe leftAtrium dilation , mild elevation of Pulmonary pressure, She is currently on Eliquis and also cardizem fpr Afob and is rate controlled  Hyperlipidemia: taking Pravastatin, we will continue current medication,reviewed labs done 04/2019 LDL was 56 .No myalgia. No chest pain or palpitation    RLS: symptoms are still present, described as soreness on both legs at night, improves when she rubs her legs. She was having cramps after taking lasix, but is currently taking lasix every other day and has been doing better.Stable at this time  CKIII: [facility], last labs reviewed still stage III CKI with mild drop of GFR , no itching , good urine output, she has a follow up this month.   OSA: she has been wearing CPAP machineevery night now, better compliance sinceshe got a new machine. Wears it every  night.  Morbid Obesity:unable to check her weight today, hopefully it will help her weight loss with decrease in insulin and better diet. We will check her weight when she comes in for labs if possible   Asthma Moderate persistent/Chronic bronchitis: one flare back in02/2018. She is compliant and uses Advair twice daily as prescribed, no side effects. No wheezing or SOBat this time, she states worse during the Summer with humidity and pollen. Stable at this time  Atherosclerosis of aorta and echo showed mild dilation of ascending aorta, continue statin and eliquis. Doing well on medication    Patient Active Problem List   Diagnosis Date Noted  . History of colonic polyps   . Polyp of colon   . Chronic bronchitis ([place]) 08/30/2018  . Thoracic aortic atherosclerosis ([place]) 12/30/2016  . Controlled type 2 diabetes mellitus with stage 3 chronic kidney disease ([place]) 12/23/2016  . Venous insufficiency 05/21/2016  . Hypertensive heart disease 05/21/2016  . Anemia in chronic kidney disease 12/22/2015  . Pulmonary hypertension ([place]) 12/18/2015  . 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 Anxiety 04/12/2015  . Chronic kidney disease (CKD), stage III (moderate) 04/12/2015  . Edema extremities 04/12/2015  . 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 Asthma, moderate persistent 04/12/2015  . Morbid obesity, unspecified obesity type ([place]) 04/12/2015  . Obstructive apnea 04/12/2015  . Restless leg 04/12/2015  . Allergic rhinitis 04/12/2015  . Vitamin D deficiency 04/12/2015  . Controlled gout 04/12/2015  . Premature atrial contractions 11/29/2013  . Atrial fibrillation ([place]) 11/09/2013  .  Benign essential hypertension   . Hyperlipidemia     Past Surgical History:  Procedure Laterality Date  . CARDIAC CATHETERIZATION  2002   [facility]  . COLONOSCOPY WITH PROPOFOL N/A 09/25/2018   Procedure: COLONOSCOPY WITH PROPOFOL;  Surgeon: Jonathon Bellows, MD;  Location[facility] ENDOSCOPY;  Service: Gastroenterology;   Laterality: N/A;  . COLONOSCOPY WITH PROPOFOL N/A 04/13/2019   Procedure: COLONOSCOPY WITH PROPOFOL;  Surgeon: Jonathon Bellows, MD;  Location: [facility] ENDOSCOPY;  Service: Gastroenterology;  Laterality: N/A;  . PERICARDIUM SURGERY      Family History  Problem Relation Age of Onset  . Heart attack Mother   . Hypertension Mother   . Breast cancer Neg Hx     Social History   Tobacco Use  . Smoking status: Never Smoker  . Smokeless tobacco: Never Used  . Tobacco comment: n/a  Substance Use Topics  . Alcohol use: No    Alcohol/week: 0.0 standard drinks  . Drug use: No     Current Outpatient Medications:  .  albuterol (PROVENTIL HFA;VENTOLIN HFA) 108 (90 BASE) MCG/ACT inhaler, Inhale 2 puffs into the lungs every 6 (six) hours as needed for wheezing or shortness of breath., Disp: , Rfl:  .  allopurinol (ZYLOPRIM) 100 MG tablet, TAKE 1 TABLET (100 MG TOTAL) BY MOUTH 2 (TWO) TIMES DAILY., Disp: 180 tablet, Rfl: 1 .  apixaban (ELIQUIS) 5 MG TABS tablet, Take 1 tablet (5 mg total) by mouth 2 (two) times daily., Disp: 180 tablet, Rfl: 3 .  Blood Glucose Calibration (TRUE METRIX LEVEL 1) Low SOLN, 1 each by In Vitro route once a week., Disp: 1 each, Rfl: 1 .  Blood Glucose Monitoring Suppl (TRUE METRIX AIR GLUCOSE METER) w/Device KIT, Inject 1 each into the skin 4 (four) times daily as needed. Check fsbs 4 times daily E11.22, Disp: 1 kit, Rfl: 0 .  Cholecalciferol (VITAMIN D) 2000 UNITS tablet, Take 2,000 Units by mouth daily., Disp: , Rfl:  .  colchicine (COLCRYS) 0.6 MG tablet, Take 1-2 tablets (0.6-1.2 mg total) by mouth daily as needed., Disp: 30 tablet, Rfl: 0 .  diltiazem (CARDIZEM CD) 300 MG 24 hr capsule, TAKE 1 CAPSULE (300 MG TOTAL) BY MOUTH DAILY., Disp: 90 capsule, Rfl: 1 .  DROPLET PEN NEEDLES 30G X 8 MM MISC, USE TWICE DAILY WITH INSULIN, Disp: 200 each, Rfl: 0 .  Dulaglutide (TRULICITY) 1.5 LD/3.5TS SOPN, Inject 1.5 mg into the skin once a week., Disp: 9 mL, Rfl: 3 .  Elastic Bandages  & Supports (MEDICAL COMPRESSION STOCKINGS) MISC, 2 each by Does not apply route daily., Disp: 2 each, Rfl: 2 .  fluticasone (FLONASE) 50 MCG/ACT nasal spray, USE 2 SPRAYS IN EACH NOSTRIL EVERY DAY, Disp: 48 g, Rfl: 2 .  Fluticasone-Salmeterol (ADVAIR DISKUS) 250-50 MCG/DOSE AEPB, Inhale 1 puff into the lungs 2 (two) times daily., Disp: 3 each, Rfl: 1 .  furosemide (LASIX) 40 MG tablet, Take 1 tablet (40 mg total) by mouth 2 (two) times daily as needed., Disp: 180 tablet, Rfl: 1 .  Insulin Lispro Prot & Lispro (HUMALOG MIX 75/25 KWIKPEN) (75-25) 100 UNIT/ML Kwikpen, Inject 38-60 Units into the skin daily., Disp: 15 mL, Rfl: 0 .  irbesartan (AVAPRO) 300 MG tablet, TAKE 1 TABLET (300 MG TOTAL) BY MOUTH DAILY., Disp: 90 tablet, Rfl: 1 .  pravastatin (PRAVACHOL) 80 MG tablet, TAKE 1 TABLET (80 MG TOTAL) BY MOUTH EVERY EVENING., Disp: 90 tablet, Rfl: 1 .  TRUE METRIX BLOOD GLUCOSE TEST test strip, USE  AS  INSTRUCTED, Disp: 300 each, Rfl: 1 .  TRUEPLUS LANCETS 33G MISC, 1 each by Does not apply route 3 (three) times daily., Disp: 300 each, Rfl: 2  No Known Allergies  I personally reviewed active problem list, medication list, allergies, family history, social history, health maintenance with the patient/caregiver today.   ROS  Constitutional: Negative for fever , positive for weight change.  Respiratory: Negative for cough , she has some  shortness of breath with activity    Cardiovascular: Negative for chest pain, occasional  Palpitations with activity .  Gastrointestinal: Negative for abdominal pain, no bowel changes.  Musculoskeletal: Positive  for gait problem but no  joint swelling. Uses a cane  Skin: Negative for rash.  Neurological: Negative for dizziness or headache.  No other specific complaints in a complete review of systems (except as listed in HPI above).  Objective  Vitals:   10/08/19 0756  BP: 130/60  Pulse: 77  Resp: 16  Temp: (!) 96.6 F (35.9 C)  SpO2: 98%  Weight: (!)  387 lb 9.6 oz (175.8 kg)  Height: '5\' 10"'  (1.778 m)    Body mass index is 55.61 kg/m.  Physical Exam  Constitutional: Patient appears well-developed and well-nourished. Obese No distress.  HEENT: head atraumatic, normocephalic, pupils equal and reactive to light Cardiovascular: Normal rate, regular rhythm and normal heart sounds.  No murmur heard. No BLE edema. Pulmonary/Chest: Effort normal and breath sounds normal. No respiratory distress. Abdominal: Soft.  There is no tenderness. Psychiatric: Patient has a normal mood and affect. behavior is normal. Judgment and thought content normal.  Diabetic Foot Exam: Diabetic Foot Exam - Simple   Simple Foot Form Diabetic Foot exam was performed with the following findings: Yes 10/08/2019  8:36 AM  Visual Inspection See comments: Yes Sensation Testing Intact to touch and monofilament testing bilaterally: Yes Pulse Check Posterior Tibialis and Dorsalis pulse intact bilaterally: Yes Comments Dry skin, some callus formation       PHQ2/9: Depression screen [facility] 2/9 10/08/2019 06/15/2019 05/08/2019 01/01/2019 04/25/2018  Decreased Interest 0 0 0 0 0  Down, Depressed, Hopeless 0 0 0 0 0  PHQ - 2 Score 0 0 0 0 0  Altered sleeping 0 - 0 0 -  Tired, decreased energy 0 - 0 0 -  Change in appetite 0 - 0 0 -  Feeling bad or failure about yourself  0 - 0 0 -  Trouble concentrating 0 - 0 0 -  Moving slowly or fidgety/restless 0 - 0 0 -  Suicidal thoughts 0 - 0 0 -  PHQ-9 Score 0 - 0 0 -  Difficult doing work/chores Not difficult at all - - Not difficult at all -    phq 9 is negative  Fall Risk: Fall Risk  10/08/2019 06/15/2019 05/08/2019 01/01/2019 08/30/2018  Falls in the past year? 0 0 0 0 0  Number falls in past yr: 0 0 0 0 0  Injury with Fall? 0 0 0 0 0  Follow up - Falls prevention discussed - Falls evaluation completed -     Functional Status Survey: Is the patient deaf or have difficulty hearing?: No Does the patient have difficulty  seeing, even when wearing glasses/contacts?: No Does the patient have difficulty concentrating, remembering, or making decisions?: No Does the patient have difficulty walking or climbing stairs?: Yes Does the patient have difficulty dressing or bathing?: No Does the patient have difficulty doing errands alone such as visiting a doctor's office or shopping?: No  Assessment & Plan  1. Controlled type 2 diabetes mellitus with microalbuminuria, with long-term current use of insulin (HCC)  - POCT glycosylated hemoglobin (Hb A1C)  2. Morbid obesity ([place])  Discussed with the patient the risk posed by an increased BMI. Discussed importance of portion control, calorie counting and at least 150 minutes of physical activity weekly. Avoid sweet beverages and drink more water. Eat at least 6 servings of fruit and vegetables daily   3. Atrial fibrillation, unspecified type ([place])  Rate controlled  4. Controlled gout   5. Hypertensive heart disease with heart failure ([place])  Keep follow up with Dr. Fletcher Anon  6. Benign essential hypertension   at goal   7. Dyslipidemia associated with type 2 diabetes mellitus ([place])  On statin and LDL   8. Thoracic aortic atherosclerosis (HCC)  Continue statin and Eliquis  9. Obstructive apnea  Continue compliance   10. Chronic pain of left knee  Stable at this time  11. Pulmonary hypertension ([place])   12. Moderate persistent asthma without complication  - Fluticasone-Salmeterol (ADVAIR DISKUS) 250-50 MCG/DOSE AEPB; Inhale 1 puff into the lungs 2 (two) times daily.  Dispense: 3 each; Refill: 1

## 2019-10-22 DIAGNOSIS — G4733 Obstructive sleep apnea (adult) (pediatric): Secondary | ICD-10-CM | POA: Diagnosis not present

## 2019-10-31 ENCOUNTER — Other Ambulatory Visit: Payer: Self-pay | Admitting: Family Medicine

## 2019-10-31 DIAGNOSIS — I110 Hypertensive heart disease with heart failure: Secondary | ICD-10-CM

## 2019-10-31 NOTE — Telephone Encounter (Signed)
Requested Prescriptions  Pending Prescriptions Disp Refills  . furosemide (LASIX) 40 MG tablet [Pharmacy Med Name: FUROSEMIDE 40 MG Tablet] 180 tablet 1    Sig: TAKE 1 TABLET (40 MG TOTAL) BY MOUTH 2 (TWO) TIMES DAILY AS NEEDED.     Cardiovascular:  Diuretics - Loop Failed - 10/31/2019  1:29 PM      Failed - Cr in normal range and within 360 days    Creat  Date Value Ref Range Status  05/14/2019 1.35 (H) 0.60 - 0.93 mg/dL Final    Comment:    For patients >17 years of age, the reference limit for Creatinine is approximately 13% higher for people identified as African-American. .    Creatinine, Urine  Date Value Ref Range Status  05/14/2019 69 20 - 275 mg/dL Final         Passed - K in normal range and within 360 days    Potassium  Date Value Ref Range Status  05/14/2019 4.7 3.5 - 5.3 mmol/L Final  11/09/2013 4.0 3.5 - 5.1 mmol/L Final         Passed - Ca in normal range and within 360 days    Calcium  Date Value Ref Range Status  05/14/2019 8.8 8.6 - 10.4 mg/dL Final   Calcium, Total  Date Value Ref Range Status  11/09/2013 8.5 8.5 - 10.1 mg/dL Final         Passed - Na in normal range and within 360 days    Sodium  Date Value Ref Range Status  05/14/2019 141 135 - 146 mmol/L Final  08/04/2018 141 134 - 144 mmol/L Final  11/09/2013 138 136 - 145 mmol/L Final         Passed - Last BP in normal range    BP Readings from Last 1 Encounters:  10/08/19 130/60         Passed - Valid encounter within last 6 months    Recent Outpatient Visits          3 weeks ago Controlled type 2 diabetes mellitus with microalbuminuria, with long-term current use of insulin [facility])   [facility] [place], Drue Stager, MD   5 months ago Controlled type 2 diabetes mellitus with microalbuminuria, with long-term current use of insulin [facility])   [facility] Steele Sizer, MD   10 months ago Controlled type 2 diabetes mellitus with microalbuminuria, with  long-term current use of insulin [facility])   [facility] [place], Drue Stager, MD   1 year ago Controlled type 2 diabetes mellitus with stage 3 chronic kidney disease, with long-term current use of insulin [facility])   [facility] [place], Drue Stager, MD   1 year ago Controlled type 2 diabetes mellitus with stage 3 chronic kidney disease, with long-term current use of insulin [facility])   [facility] Steele Sizer, MD      Future Appointments            In 2 weeks Fletcher Anon, Mertie Clause, MD [facility]   In 3 months Steele Sizer, MD [facility], [place]   In 7 months  [facility], [facility]

## 2019-11-01 ENCOUNTER — Telehealth: Payer: Medicare PPO | Admitting: Cardiovascular Disease

## 2019-11-02 NOTE — Progress Notes (Signed)
This encounter was created in error - please disregard.

## 2019-11-14 DIAGNOSIS — E559 Vitamin D deficiency, unspecified: Secondary | ICD-10-CM | POA: Diagnosis not present

## 2019-11-14 DIAGNOSIS — N183 Chronic kidney disease, stage 3 unspecified: Secondary | ICD-10-CM | POA: Diagnosis not present

## 2019-11-14 DIAGNOSIS — D631 Anemia in chronic kidney disease: Secondary | ICD-10-CM | POA: Diagnosis not present

## 2019-11-15 ENCOUNTER — Other Ambulatory Visit: Payer: Self-pay

## 2019-11-15 ENCOUNTER — Ambulatory Visit (INDEPENDENT_AMBULATORY_CARE_PROVIDER_SITE_OTHER): Payer: Medicare PPO | Admitting: Cardiovascular Disease

## 2019-11-15 ENCOUNTER — Encounter: Payer: Self-pay | Admitting: Cardiovascular Disease

## 2019-11-15 VITALS — BP 140/62 | HR 71 | Ht 70.0 in | Wt 393.0 lb

## 2019-11-15 DIAGNOSIS — I5032 Chronic diastolic (congestive) heart failure: Secondary | ICD-10-CM

## 2019-11-15 DIAGNOSIS — I4821 Permanent atrial fibrillation: Secondary | ICD-10-CM | POA: Diagnosis not present

## 2019-11-15 DIAGNOSIS — I10 Essential (primary) hypertension: Secondary | ICD-10-CM

## 2019-11-15 NOTE — Progress Notes (Signed)
Cardiology Office Note   Date:  11/15/2019   ID:  Amber Reyes, DOB 1946/02/06, MRN [id number]  PCP:  Steele Sizer, MD  Cardiologist:   Kathlyn Sacramento, MD   Chief Complaint  Patient presents with  . other    12 month follow up. Meds reviewed by the pt. verbally. Pt. c/o shortness of breath with over exertion.       History of Present Illness: Amber Reyes is a 74 y.o. female who presents for a followup visit regarding chronic diastolic heart failure and chronic atrial fibrillation. 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  Atrial fibrillation is being treated with rate control and anticoagulation. Most recent echocardiogram in 2019 showed normal LV systolic function with severely dilated left atrium and mild pulmonary hypertension. She has been doing well from a cardiac standpoint with no chest pain or palpitations.  She has stable exertional dyspnea.  She has not been able to lose much weight.  She uses CPAP regularly.  She has some difficulty affording Eliquis but her children have been helping her with the cost.   Past Medical History:  Diagnosis Date  . Allergic rhinitis, cause unspecified   . Anxiety   . Chronic diastolic CHF (congestive heart failure) ([place])   . CKD (chronic kidney disease), stage III   . Edema   . Essential hypertension, benign   . Gout, unspecified   . Heart murmur    as child  . Hyperlipidemia   . Lipoma of unspecified site   . Other and unspecified disc disorder of lumbar region   . Other general symptoms(780.99)   . PAC (premature atrial contraction)   . Renal insufficiency   . Restless legs syndrome (RLS)   . Sebaceous cyst   . Super obesity   . Type II or unspecified type diabetes mellitus without mention of complication, uncontrolled   . Unspecified asthma(493.90)    . Unspecified disease of pericardium   . Unspecified sleep apnea   . Unspecified vitamin D deficiency   . Vaginitis and vulvovaginitis, unspecified   . Venous insufficiency     Past Surgical History:  Procedure Laterality Date  . CARDIAC CATHETERIZATION  2002   [facility]  . COLONOSCOPY WITH PROPOFOL N/A 09/25/2018   Procedure: COLONOSCOPY WITH PROPOFOL;  Surgeon: Jonathon Bellows, MD;  Location: [facility] ENDOSCOPY;  Service: Gastroenterology;  Laterality: N/A;  . COLONOSCOPY WITH PROPOFOL N/A 04/13/2019   Procedure: COLONOSCOPY WITH PROPOFOL;  Surgeon: Jonathon Bellows, MD;  Location: [facility] ENDOSCOPY;  Service: Gastroenterology;  Laterality: N/A;  . PERICARDIUM SURGERY       Current Outpatient Medications  Medication Sig Dispense Refill  . albuterol (PROVENTIL HFA;VENTOLIN HFA) 108 (90 BASE) MCG/ACT inhaler Inhale 2 puffs into the lungs every 6 (six) hours as needed for wheezing or shortness of breath.    . allopurinol (ZYLOPRIM) 100 MG tablet TAKE 1 TABLET (100 MG TOTAL) BY MOUTH 2 (TWO) TIMES DAILY. 180 tablet 1  . apixaban (ELIQUIS) 5 MG TABS tablet Take 1 tablet (5 mg total) by mouth 2 (two) times daily. 180 tablet 3  . Blood Glucose Calibration (TRUE METRIX LEVEL 1) Low SOLN 1 each by In Vitro route once a week. 1 each 1  . Blood Glucose Monitoring Suppl (TRUE METRIX AIR GLUCOSE METER) w/Device KIT Inject 1 each into the skin 4 (four) times daily as needed. Check fsbs  4 times daily E11.22 1 kit 0  . Cholecalciferol (VITAMIN D) 2000 UNITS tablet Take 2,000 Units by mouth daily.    . colchicine (COLCRYS) 0.6 MG tablet Take 1-2 tablets (0.6-1.2 mg total) by mouth daily as needed. 30 tablet 0  . diltiazem (CARDIZEM CD) 300 MG 24 hr capsule TAKE 1 CAPSULE (300 MG TOTAL) BY MOUTH DAILY. 90 capsule 1  . DROPLET PEN NEEDLES 30G X 8 MM MISC USE TWICE DAILY WITH INSULIN 200 each 0  . Dulaglutide (TRULICITY) 1.5 ER/7.4YC SOPN Inject 1.5 mg into the skin once a week. 9 mL 3  . Elastic Bandages & Supports  (MEDICAL COMPRESSION STOCKINGS) MISC 2 each by Does not apply route daily. 2 each 2  . fluticasone (FLONASE) 50 MCG/ACT nasal spray USE 2 SPRAYS IN EACH NOSTRIL EVERY DAY 48 g 2  . Fluticasone-Salmeterol (ADVAIR DISKUS) 250-50 MCG/DOSE AEPB Inhale 1 puff into the lungs 2 (two) times daily. 3 each 1  . furosemide (LASIX) 40 MG tablet TAKE 1 TABLET (40 MG TOTAL) BY MOUTH 2 (TWO) TIMES DAILY AS NEEDED. 180 tablet 1  . Insulin Lispro Prot & Lispro (HUMALOG MIX 75/25 KWIKPEN) (75-25) 100 UNIT/ML Kwikpen Inject 38-60 Units into the skin daily. 15 mL 0  . irbesartan (AVAPRO) 300 MG tablet TAKE 1 TABLET (300 MG TOTAL) BY MOUTH DAILY. 90 tablet 1  . pravastatin (PRAVACHOL) 80 MG tablet TAKE 1 TABLET (80 MG TOTAL) BY MOUTH EVERY EVENING. 90 tablet 1  . TRUE METRIX BLOOD GLUCOSE TEST test strip USE  AS  INSTRUCTED 300 each 1  . TRUEPLUS LANCETS 33G MISC 1 each by Does not apply route 3 (three) times daily. 300 each 2   No current facility-administered medications for this visit.    Allergies:   Patient has no known allergies.    Social History:  The patient  reports that she has never smoked. She has never used smokeless tobacco. She reports that she does not drink alcohol or use drugs.   Family History:  The patient's family history includes Heart attack in her mother; Hypertension in her mother.    ROS:  Please see the history of present illness.   Otherwise, review of systems are positive for none.   All other systems are reviewed and negative.    PHYSICAL EXAM: VS:  BP 140/62 (BP Location: Left Arm, Patient Position: Sitting, Cuff Size: Large)   Pulse 71   Ht '5\' 10"'  (1.778 m)   Wt (!) 393 lb (178.3 kg)   SpO2 98%   BMI 56.39 kg/m  , BMI Body mass index is 56.39 kg/m. GEN: Well nourished, well developed, in no acute distress  HEENT: normal  Neck: no JVD, carotid bruits, or masses Cardiac: Irregularly irregular; no rubs, or gallops,no edema . 1/6 systolic murmur at the base Respiratory:   clear to auscultation bilaterally, normal work of breathing GI: soft, nontender, nondistended, + BS MS: no deformity or atrophy  Skin: warm and dry, no rash Neuro:  Strength and sensation are intact Psych: euthymic mood, full affect   EKG:  EKG is ordered today. The ekg ordered today demonstrates atrial fibrillation with possible old anterior infarct.   Recent Labs: 05/14/2019: ALT 5; BUN 33; Creat 1.35; Hemoglobin 10.2; Platelets 227; Potassium 4.7; Sodium 141    Lipid Panel    Component Value Date/Time   CHOL 117 05/14/2019 0000   CHOL 139 04/16/2015 1138   TRIG 76 05/14/2019 0000   HDL 45 (L) 05/14/2019 0000  HDL 52 04/16/2015 1138   CHOLHDL 2.6 05/14/2019 0000   VLDL 19 12/23/2016 0941   LDLCALC 56 05/14/2019 0000      Wt Readings from Last 3 Encounters:  11/15/19 (!) 393 lb (178.3 kg)  10/08/19 (!) 387 lb 9.6 oz (175.8 kg)  06/15/19 (!) 391 lb (177.4 kg)       No flowsheet data found.    ASSESSMENT AND PLAN:  1.  Chronic atrial fibrillation:  Chads Vasc score is 5 .  Continue anticoagulation with Eliquis.  I discussed alternatives to anticoagulation including warfarin but she is able to manage for now with Eliquis.  I reviewed most recent labs in September which showed stable chronic kidney disease with creatinine of 1.35.  She also has stable anemia.    2. Chronic diastolic heart failure: She appears to be euvolemic on current dose of furosemide .   She uses furosemide as needed.  3. Essential hypertension: Blood pressure is reasonably controlled  4.  Obstructive sleep apnea: She uses CPAP on a regular basis.   Disposition:   FU with me in 12 months  Signed,  Kathlyn Sacramento, MD  11/15/2019 8:30 AM    [place]

## 2019-11-15 NOTE — Patient Instructions (Addendum)
Medication Instructions:  Your physician recommends that you continue on your current medications as directed. Please refer to the Current Medication list given to you today.  *If you need a refill on your cardiac medications before your next appointment, please call your pharmacy*   Lab Work: None ordered If you have labs (blood work) drawn today and your tests are completely normal, you will receive your results only by: . MyChart Message (if you have MyChart) OR . A paper copy in the mail If you have any lab test that is abnormal or we need to change your treatment, we will call you to review the results.   Testing/Procedures: None ordered   Follow-Up: At [facility], you and your health needs are our priority.  As part of our continuing mission to provide you with exceptional heart care, we have created designated Provider Care Teams.  These Care Teams include your primary Cardiologist (physician) and Advanced Practice Providers (APPs -  Physician Assistants and Nurse Practitioners) who all work together to provide you with the care you need, when you need it.  We recommend signing up for the patient portal called "MyChart".  Sign up information is provided on this After Visit Summary.  MyChart is used to connect with patients for Virtual Visits (Telemedicine).  Patients are able to view lab/test results, encounter notes, upcoming appointments, etc.  Non-urgent messages can be sent to your provider as well.   To learn more about what you can do with MyChart, go to [email or web address].    Your next appointment:   12 month(s)  The format for your next appointment:   In Person  Provider:    You may see Dr. Arida or one of the following Advanced Practice Providers on your designated Care Team:    Christopher Berge, NP  Ryan Dunn, PA-C  Jacquelyn Visser, PA-C    Other Instructions N/A  

## 2019-11-16 ENCOUNTER — Ambulatory Visit
Admission: RE | Admit: 2019-11-16 | Discharge: 2019-11-16 | Disposition: A | Discharge status: Home / Self Care | Payer: Medicare PPO | Source: Ambulatory Visit | Attending: Family Medicine | Admitting: Family Medicine

## 2019-11-16 DIAGNOSIS — Z1231 Encounter for screening mammogram for malignant neoplasm of breast: Secondary | ICD-10-CM | POA: Diagnosis not present

## 2019-11-22 DIAGNOSIS — G4733 Obstructive sleep apnea (adult) (pediatric): Secondary | ICD-10-CM | POA: Diagnosis not present

## 2019-12-03 ENCOUNTER — Other Ambulatory Visit: Payer: Self-pay | Admitting: Family Medicine

## 2019-12-03 DIAGNOSIS — R809 Proteinuria, unspecified: Secondary | ICD-10-CM

## 2019-12-03 DIAGNOSIS — E1129 Type 2 diabetes mellitus with other diabetic kidney complication: Secondary | ICD-10-CM

## 2019-12-22 DIAGNOSIS — G4733 Obstructive sleep apnea (adult) (pediatric): Secondary | ICD-10-CM | POA: Diagnosis not present

## 2019-12-24 ENCOUNTER — Other Ambulatory Visit: Payer: Self-pay | Admitting: Cardiovascular Disease

## 2019-12-24 NOTE — Telephone Encounter (Signed)
Please review for refill, Thanks !  

## 2019-12-24 NOTE — Telephone Encounter (Signed)
Pt's age 74, wt 178.3 kg, SCr 1.35, CrCl 107.59, last ov w/ MA 11/15/19.

## 2020-01-14 ENCOUNTER — Telehealth: Payer: Self-pay | Admitting: Family Medicine

## 2020-01-14 NOTE — Chronic Care Management (AMB) (Signed)
  Chronic Care Management   Note  01/14/2020 Name: LAZETTE ESTALA MRN: [id number] DOB: [birth year]  ARTEMIS KOLLER is a 74 y.o. year old female who is a primary care patient of Steele Sizer, MD. I reached out to [name] by phone today in response to a referral sent by Ms. Lillette Boxer Dieter's health plan.     Ms. Bustamante was given information about Chronic Care Management services today including:  1. CCM service includes personalized support from designated clinical staff supervised by her physician, including individualized plan of care and coordination with other care providers 2. 24/7 contact phone numbers for assistance for urgent and routine care needs. 3. Service will only be billed when office clinical staff spend 20 minutes or more in a month to coordinate care. 4. Only one practitioner may furnish and bill the service in a calendar month. 5. The patient may stop CCM services at any time (effective at the end of the month) by phone call to the office staff. 6. The patient will be responsible for cost sharing (co-pay) of up to 20% of the service fee (after annual deductible is met).  Patient agreed to services and verbal consent obtained.   Follow up plan: Telephone appointment with care management team member scheduled for: 02/12/2020  [facility]  [place], [place] [address] Direct Dial: [email or web address] Website: [email or web address]

## 2020-01-22 DIAGNOSIS — G4733 Obstructive sleep apnea (adult) (pediatric): Secondary | ICD-10-CM | POA: Diagnosis not present

## 2020-02-06 ENCOUNTER — Other Ambulatory Visit: Payer: Self-pay

## 2020-02-06 ENCOUNTER — Ambulatory Visit (INDEPENDENT_AMBULATORY_CARE_PROVIDER_SITE_OTHER): Payer: Medicare PPO | Admitting: Family Medicine

## 2020-02-06 ENCOUNTER — Encounter: Payer: Self-pay | Admitting: Family Medicine

## 2020-02-06 VITALS — BP 135/63

## 2020-02-06 DIAGNOSIS — I110 Hypertensive heart disease with heart failure: Secondary | ICD-10-CM

## 2020-02-06 DIAGNOSIS — I10 Essential (primary) hypertension: Secondary | ICD-10-CM | POA: Diagnosis not present

## 2020-02-06 DIAGNOSIS — G4733 Obstructive sleep apnea (adult) (pediatric): Secondary | ICD-10-CM

## 2020-02-06 DIAGNOSIS — I4891 Unspecified atrial fibrillation: Secondary | ICD-10-CM | POA: Diagnosis not present

## 2020-02-06 DIAGNOSIS — I700 Atherosclerosis of aorta: Secondary | ICD-10-CM

## 2020-02-06 DIAGNOSIS — E1122 Type 2 diabetes mellitus with diabetic chronic kidney disease: Secondary | ICD-10-CM | POA: Insufficient documentation

## 2020-02-06 DIAGNOSIS — I272 Pulmonary hypertension, unspecified: Secondary | ICD-10-CM

## 2020-02-06 DIAGNOSIS — E1169 Type 2 diabetes mellitus with other specified complication: Secondary | ICD-10-CM

## 2020-02-06 DIAGNOSIS — I129 Hypertensive chronic kidney disease with stage 1 through stage 4 chronic kidney disease, or unspecified chronic kidney disease: Secondary | ICD-10-CM

## 2020-02-06 DIAGNOSIS — M109 Gout, unspecified: Secondary | ICD-10-CM | POA: Diagnosis not present

## 2020-02-06 DIAGNOSIS — Z794 Long term (current) use of insulin: Secondary | ICD-10-CM

## 2020-02-06 DIAGNOSIS — R809 Proteinuria, unspecified: Secondary | ICD-10-CM

## 2020-02-06 DIAGNOSIS — E1129 Type 2 diabetes mellitus with other diabetic kidney complication: Secondary | ICD-10-CM

## 2020-02-06 DIAGNOSIS — N183 Chronic kidney disease, stage 3 unspecified: Secondary | ICD-10-CM

## 2020-02-06 DIAGNOSIS — E785 Hyperlipidemia, unspecified: Secondary | ICD-10-CM

## 2020-02-06 DIAGNOSIS — J4540 Moderate persistent asthma, uncomplicated: Secondary | ICD-10-CM

## 2020-02-06 MED ORDER — IRBESARTAN 300 MG PO TABS: 300.0000 mg | ORAL_TABLET | Freq: Every day | ORAL | 1 refills | Status: DC

## 2020-02-06 MED ORDER — DILTIAZEM HCL ER COATED BEADS 300 MG PO CP24: 300.0000 mg | ORAL_CAPSULE | Freq: Every day | ORAL | 1 refills | Status: DC

## 2020-02-06 MED ORDER — ALLOPURINOL 100 MG PO TABS: 100.0000 mg | ORAL_TABLET | Freq: Two times a day (BID) | ORAL | 1 refills | Status: DC

## 2020-02-06 MED ORDER — PRAVASTATIN SODIUM 80 MG PO TABS: 80.0000 mg | ORAL_TABLET | Freq: Every evening | ORAL | 1 refills | Status: DC

## 2020-02-06 MED ORDER — FLUTICASONE-SALMETEROL 250-50 MCG/DOSE IN AEPB: 1.0000 | INHALATION_SPRAY | Freq: Two times a day (BID) | RESPIRATORY_TRACT | 1 refills | Status: DC

## 2020-02-06 NOTE — Progress Notes (Signed)
Name: Amber Reyes   MRN: [id number]    DOB: [birth year]   Date:02/06/2020       Progress Note  Subjective  Chief Complaint  Chief Complaint  Patient presents with  . Diabetes    follow up BS today was 95  . Hyperlipidemia  . Hypertension    I connected with  Amber Reyes on 02/06/20 at  7:40 AM EDT by telephone and verified that I am speaking with the correct person using two identifiers.  I discussed the limitations, risks, security and privacy concerns of performing an evaluation and management service by telephone and the availability of in person appointments. Staff also discussed with the patient that there may be a patient responsible charge related to this service. Patient Location: at home  Provider Location: [facility]   HPI  DMII controlled: She has been compliant with medication,she is on  Humalog 75/25 since  Feb 2018, also started on Trulicity in 6659 and [id number] been at goal.LasthgbA1Cwas6.5% today it was 6.7% Taking Avapro for nephropathy, still has some  microalbuminuria, last level 78, she is on statin for dyslipidemia .No polyphagia, polydipsia or polyuria..Fasting in the 75-115 she is down to50units in am and 20units at night.  HTN/CHF/Afib/Pulmonary hypertension : taking medications and denies side effects of medications, bp is at goal,, usually in the 130's/60-70's she denies chest painno recentpalpitation, found to have Afib on EKG done by Dr. Fletcher Anon - she was not wearing CPAP consistently at the time, but has been very compliant , she wears  CPAP all night  She has orthopnea - uses two pillows , SOB is stable, and edema has been stable. Echo done 11/2019LV function is normaland unable to assess diastolic dysfunction, severe leftAtrium dilation , mild elevation of Pulmonary pressure. She is currently on Eliquis and also cardizem fpr Afib and is rate controlled  Hyperlipidemia: taking Pravastatin, we will continue current  medication,reviewed labs done 04/2019 LDL was 56 .No myalgia. She will come in person on her next visit so we can recheck labs  RLS: symptoms are still present, described as soreness on both legs at night, improves when she rubs her legs. She was having cramps after taking lasix, but is currently taking lasix every other day and has been doing better.  CKIII: [facility], last labs reviewed still stage III CKI with mild drop of GFR , no itching , good urine output, she is not taking potassium because she had hyperkalemia   OSA: she has been wearing CPAP machineevery night now, better compliance sinceshe got a new machine. Wears it every night , all night   Morbid Obesity:unable to check her weight today, hopefully it will help her weight loss with decrease in insulin and better diet.Discussed increasing dose of Trulicity , but she is getting medication through [employer] and we will hold off until new order is due .   Asthma Moderate persistent/Chronic bronchitis: one flare back in02/2018. She is compliant and uses Advair twice daily as prescribed, no side effects. No wheezing or SOBat this time, she states worse during the Summer with humidity, also when pollen count is high, she states doing well, she uses rescue inhaler at most once a week.   Atherosclerosis of aorta and echo showed mild dilation of ascending aorta, continue statin and eliquis.No side effects of medications   Patient Active Problem List   Diagnosis Date Noted  . History of colonic polyps   . Polyp of colon   .  Chronic bronchitis ([place]) 08/30/2018  . Thoracic aortic atherosclerosis ([place]) 12/30/2016  . Controlled type 2 diabetes mellitus with stage 3 chronic kidney disease ([place]) 12/23/2016  . Venous insufficiency 05/21/2016  . Hypertensive heart disease 05/21/2016  . Anemia in chronic kidney disease 12/22/2015  . Pulmonary hypertension ([place]) 12/18/2015  . Xanthelasma  04/15/2015  . Anxiety 04/12/2015  . Chronic kidney disease (CKD), stage III (moderate) 04/12/2015  . Edema extremities 04/12/2015  . 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 Asthma, moderate persistent 04/12/2015  . Morbid obesity, unspecified obesity type ([place]) 04/12/2015  . Obstructive apnea 04/12/2015  . Restless leg 04/12/2015  . Allergic rhinitis 04/12/2015  . Vitamin D deficiency 04/12/2015  . Controlled gout 04/12/2015  . Premature atrial contractions 11/29/2013  . Atrial fibrillation ([place]) 11/09/2013  . Benign essential hypertension   . Hyperlipidemia     Past Surgical History:  Procedure Laterality Date  . CARDIAC CATHETERIZATION  2002   [facility]  . COLONOSCOPY WITH PROPOFOL N/A 09/25/2018   Procedure: COLONOSCOPY WITH PROPOFOL;  Surgeon: Jonathon Bellows, MD;  [facility] ENDOSCOPY;  Service: Gastroenterology;  Laterality: N/A;  . COLONOSCOPY WITH PROPOFOL N/A 04/13/2019   Procedure: COLONOSCOPY WITH PROPOFOL;  Surgeon: Jonathon Bellows, MD;  Location: [facility] ENDOSCOPY;  Service: Gastroenterology;  Laterality: N/A;  . PERICARDIUM SURGERY      Family History  Problem Relation Age of Onset  . Heart attack Mother   . Hypertension Mother   . Breast cancer Neg Hx       Current Outpatient Medications:  .  albuterol (PROVENTIL HFA;VENTOLIN HFA) 108 (90 BASE) MCG/ACT inhaler, Inhale 2 puffs into the lungs every 6 (six) hours as needed for wheezing or shortness of breath., Disp: , Rfl:  .  allopurinol (ZYLOPRIM) 100 MG tablet, TAKE 1 TABLET (100 MG TOTAL) BY MOUTH 2 (TWO) TIMES DAILY., Disp: 180 tablet, Rfl: 1 .  Blood Glucose Calibration (TRUE METRIX LEVEL 1) Low SOLN, 1 each by In Vitro route once a week., Disp: 1 each, Rfl: 1 .  Blood Glucose Monitoring Suppl (TRUE METRIX AIR GLUCOSE METER) w/Device KIT, Inject 1 each into the skin 4 (four) times daily as needed. Check fsbs 4 times daily E11.22, Disp: 1 kit, Rfl: 0 .  Cholecalciferol (VITAMIN D) 2000 UNITS tablet, Take 2,000 Units  by mouth daily., Disp: , Rfl:  .  colchicine (COLCRYS) 0.6 MG tablet, Take 1-2 tablets (0.6-1.2 mg total) by mouth daily as needed., Disp: 30 tablet, Rfl: 0 .  diltiazem (CARDIZEM CD) 300 MG 24 hr capsule, TAKE 1 CAPSULE (300 MG TOTAL) BY MOUTH DAILY., Disp: 90 capsule, Rfl: 1 .  DROPLET PEN NEEDLES 30G X 8 MM MISC, USE TWICE DAILY WITH INSULIN, Disp: 200 each, Rfl: 0 .  Dulaglutide (TRULICITY) 1.5 HQ/7.5FF SOPN, Inject 1.5 mg into the skin once a week., Disp: 9 mL, Rfl: 3 .  ELIQUIS 5 MG TABS tablet, TAKE 1 TABLET TWICE DAILY, Disp: 180 tablet, Rfl: 1 .  fluticasone (FLONASE) 50 MCG/ACT nasal spray, USE 2 SPRAYS IN EACH NOSTRIL EVERY DAY, Disp: 48 g, Rfl: 2 .  Fluticasone-Salmeterol (ADVAIR DISKUS) 250-50 MCG/DOSE AEPB, Inhale 1 puff into the lungs 2 (two) times daily., Disp: 3 each, Rfl: 1 .  furosemide (LASIX) 40 MG tablet, TAKE 1 TABLET (40 MG TOTAL) BY MOUTH 2 (TWO) TIMES DAILY AS NEEDED., Disp: 180 tablet, Rfl: 1 .  Insulin Lispro Prot & Lispro (HUMALOG MIX 75/25 KWIKPEN) (75-25) 100 UNIT/ML Kwikpen, Inject 38-60 Units into the skin daily., Disp:  15 mL, Rfl: 0 .  irbesartan (AVAPRO) 300 MG tablet, TAKE 1 TABLET (300 MG TOTAL) BY MOUTH DAILY., Disp: 90 tablet, Rfl: 1 .  pravastatin (PRAVACHOL) 80 MG tablet, TAKE 1 TABLET (80 MG TOTAL) BY MOUTH EVERY EVENING., Disp: 90 tablet, Rfl: 1 .  Elastic Bandages & Supports (MEDICAL COMPRESSION STOCKINGS) MISC, 2 each by Does not apply route daily. (Patient not taking: Reported on 02/06/2020), Disp: 2 each, Rfl: 2 .  TRUE METRIX BLOOD GLUCOSE TEST test strip, USE  AS  INSTRUCTED (Patient not taking: Reported on 02/06/2020), Disp: 300 each, Rfl: 1 .  TRUEPLUS LANCETS 33G MISC, 1 each by Does not apply route 3 (three) times daily. (Patient not taking: Reported on 02/06/2020), Disp: 300 each, Rfl: 2  No Known Allergies  I personally reviewed active problem list, medication list, allergies, family history, social history with the patient/caregiver  today.   ROS  Ten systems reviewed and is negative except as mentioned in HPI   Objective  Virtual encounter, vitals not obtained.  There is no height or weight on file to calculate BMI.  Physical Exam  Awake/ alert and oriented   PHQ2/9: Depression screen [facility] 2/9 02/06/2020 10/08/2019 06/15/2019 05/08/2019 01/01/2019  Decreased Interest 0 0 0 0 0  Down, Depressed, Hopeless 0 0 0 0 0  PHQ - 2 Score 0 0 0 0 0  Altered sleeping 0 0 - 0 0  Tired, decreased energy 0 0 - 0 0  Change in appetite 0 0 - 0 0  Feeling bad or failure about yourself  0 0 - 0 0  Trouble concentrating 0 0 - 0 0  Moving slowly or fidgety/restless 0 0 - 0 0  Suicidal thoughts 0 0 - 0 0  PHQ-9 Score 0 0 - 0 0  Difficult doing work/chores Not difficult at all Not difficult at all - - Not difficult at all   PHQ-2/9 Result is negative.    Fall Risk: Fall Risk  02/06/2020 10/08/2019 06/15/2019 05/08/2019 01/01/2019  Falls in the past year? 0 0 0 0 0  Number falls in past yr: 0 0 0 0 0  Injury with Fall? 0 0 0 0 0  Follow up Falls evaluation completed - Falls prevention discussed - Falls evaluation completed     Assessment & Plan  1. Controlled type 2 diabetes mellitus with microalbuminuria, with long-term current use of insulin ([place])  Explained that I prefer in person visits with her, next time we will check labs, for now continue current regiment   2. Morbid obesity ([place])  Discussed with the patient the risk posed by an increased BMI. Discussed importance of portion control, calorie counting and at least 150 minutes of physical activity weekly. Avoid sweet beverages and drink more water. Eat at least 6 servings of fruit and vegetables daily   3. Atrial fibrillation, unspecified type (HCC)  - diltiazem (CARDIZEM CD) 300 MG 24 hr capsule; Take 1 capsule (300 mg total) by mouth daily.  Dispense: 90 capsule; Refill: 1  4. Benign essential hypertension  - irbesartan (AVAPRO) 300 MG tablet; Take 1 tablet  (300 mg total) by mouth daily.  Dispense: 90 tablet; Refill: 1 - diltiazem (CARDIZEM CD) 300 MG 24 hr capsule; Take 1 capsule (300 mg total) by mouth daily.  Dispense: 90 capsule; Refill: 1  5. Hypertensive heart disease with heart failure (HCC)  - diltiazem (CARDIZEM CD) 300 MG 24 hr capsule; Take 1 capsule (300 mg total) by mouth daily.  Dispense:  90 capsule; Refill: 1  6. Controlled gout  - allopurinol (ZYLOPRIM) 100 MG tablet; Take 1 tablet (100 mg total) by mouth 2 (two) times daily.  Dispense: 180 tablet; Refill: 1  7. Dyslipidemia associated with type 2 diabetes mellitus (HCC)  - pravastatin (PRAVACHOL) 80 MG tablet; Take 1 tablet (80 mg total) by mouth every evening.  Dispense: 90 tablet; Refill: 1  8. Thoracic aortic atherosclerosis ([place])  On statin and eliquis  9. Obstructive apnea  Compliant with CPAP   10. Moderate persistent asthma without complication  - Fluticasone-Salmeterol (ADVAIR DISKUS) 250-50 MCG/DOSE AEPB; Inhale 1 puff into the lungs 2 (two) times daily.  Dispense: 3 each; Refill: 1  11. Pulmonary hypertension ([place])  On echo 2019  12. Hypertension associated with stage 3 chronic kidney disease due to type 2 diabetes mellitus ([place])  Continue medications   I discussed the assessment and treatment plan with the patient. The patient was provided an opportunity to ask questions and all were answered. The patient agreed with the plan and demonstrated an understanding of the instructions.   The patient was advised to call back or seek an in-person evaluation if the symptoms worsen or if the condition fails to improve as anticipated.  I provided 25 minutes of non-face-to-face time during this encounter.  Loistine Chance, MD

## 2020-02-11 ENCOUNTER — Other Ambulatory Visit: Payer: Self-pay | Admitting: Family Medicine

## 2020-02-11 DIAGNOSIS — R809 Proteinuria, unspecified: Secondary | ICD-10-CM

## 2020-02-11 DIAGNOSIS — E1129 Type 2 diabetes mellitus with other diabetic kidney complication: Secondary | ICD-10-CM

## 2020-02-12 ENCOUNTER — Other Ambulatory Visit: Payer: Self-pay

## 2020-02-12 ENCOUNTER — Ambulatory Visit: Payer: Medicare PPO | Admitting: Pharmacist

## 2020-02-12 DIAGNOSIS — E1122 Type 2 diabetes mellitus with diabetic chronic kidney disease: Secondary | ICD-10-CM

## 2020-02-12 DIAGNOSIS — J4541 Moderate persistent asthma with (acute) exacerbation: Secondary | ICD-10-CM

## 2020-02-12 DIAGNOSIS — N183 Chronic kidney disease, stage 3 unspecified: Secondary | ICD-10-CM

## 2020-02-12 NOTE — Chronic Care Management (AMB) (Signed)
Chronic Care Management Pharmacy  Name: Amber Reyes  MRN: [id number] DOB: 06-24-1946  Chief Complaint/ HPI  Amber Reyes,  74 y.o. , female presents for their Initial CCM visit with the clinical pharmacist via telephone due to COVID-19 Pandemic.  PCP : Steele Sizer, MD  Their chronic conditions include: HTN, Afib, HLD, RLS, CKD3, OSA  Office Visits: 6/16 DM, [place], BP 135/63 Wt 393, A1c 6.7%, micro alb 78, FBG 75-115, BP at goal, now CPAP compliant, Eliquis, Lasix qod  Consult Visit: 3/25 Afib, [place], BP 140/62 P 71 Wt 393 BMI 56.39, 3/24 nephrology, anemia, [place], BP 141/63 P 87, GFR 35-40m/min  Medications: Outpatient Encounter Medications as of 02/12/2020  Medication Sig  . albuterol (PROVENTIL HFA;VENTOLIN HFA) 108 (90 BASE) MCG/ACT inhaler Inhale 2 puffs into the lungs every 6 (six) hours as needed for wheezing or shortness of breath.  . allopurinol (ZYLOPRIM) 100 MG tablet Take 1 tablet (100 mg total) by mouth 2 (two) times daily.  . Blood Glucose Calibration (TRUE METRIX LEVEL 1) Low SOLN 1 each by In Vitro route once a week.  . Blood Glucose Monitoring Suppl (TRUE METRIX AIR GLUCOSE METER) w/Device KIT Inject 1 each into the skin 4 (four) times daily as needed. Check fsbs 4 times daily E11.22  . Cholecalciferol (VITAMIN D) 2000 UNITS tablet Take 2,000 Units by mouth daily.  .Marland Kitchendiltiazem (CARDIZEM CD) 300 MG 24 hr capsule Take 1 capsule (300 mg total) by mouth daily.  . DROPLET PEN NEEDLES 30G X 8 MM MISC USE TWICE DAILY WITH INSULIN  . Dulaglutide (TRULICITY) 1.5 MAS/5.0NLSOPN Inject 1.5 mg into the skin once a week.  .Regino SchultzeBandages & Supports (MEDICAL COMPRESSION STOCKINGS) MISC 2 each by Does not apply route daily.  .Marland KitchenELIQUIS 5 MG TABS tablet TAKE 1 TABLET TWICE DAILY  . fluticasone (FLONASE) 50 MCG/ACT nasal spray USE 2 SPRAYS IN EACH NOSTRIL EVERY DAY  . Fluticasone-Salmeterol (ADVAIR DISKUS) 250-50 MCG/DOSE AEPB Inhale 1 puff into the lungs 2  (two) times daily.  . furosemide (LASIX) 40 MG tablet TAKE 1 TABLET (40 MG TOTAL) BY MOUTH 2 (TWO) TIMES DAILY AS NEEDED.  .Marland KitchenInsulin Lispro Prot & Lispro (HUMALOG MIX 75/25 KWIKPEN) (75-25) 100 UNIT/ML Kwikpen Inject 38-60 Units into the skin daily. (Patient taking differently: Inject 38-60 Units into the skin daily. 50 qam 20 qpm)  . irbesartan (AVAPRO) 300 MG tablet Take 1 tablet (300 mg total) by mouth daily.  . pravastatin (PRAVACHOL) 80 MG tablet Take 1 tablet (80 mg total) by mouth every evening.  . colchicine (COLCRYS) 0.6 MG tablet Take 1-2 tablets (0.6-1.2 mg total) by mouth daily as needed. (Patient not taking: Reported on 02/12/2020)   No facility-administered encounter medications on file as of 02/12/2020.     Financial Resource Strain: High Risk  . Difficulty of Paying Living Expenses: Hard    Current Diagnosis/Assessment:  Goals Addressed            This Visit's Progress   . Chronic Care Management       CARE PLAN ENTRY (see longitudinal plan of care for additional care plan information)  Current Barriers:  . Chronic Disease Management support, education, and care coordination needs related to Hypertension, Hyperlipidemia, Diabetes, and Asthma   Hypertension BP Readings from Last 3 Encounters:  02/06/20 135/63  11/15/19 140/62  10/08/19 130/60   . Pharmacist Clinical Goal(s): o Over the next 90 days, patient will work with PharmD and providers to maintain BP  goal <130/80 . Current regimen:  o Cardizem CR 324m daily o Avapro 3086mdaily o Fatigue with these high doses . Interventions: o Try Cardizem in the morning and Avapro at night . Patient self care activities - Over the next 90 days, patient will: o Check BP weekly, document, and provide at future appointments o Ensure daily salt intake < 2300 mg/day o Call PharmD with any changes to level of fatigue  Hyperlipidemia Lab Results  Component Value Date/Time   LDLCALC 56 05/14/2019 12:00 AM    . Pharmacist Clinical Goal(s): o Over the next 90 days, patient will work with PharmD and providers to maintain LDL goal < 70 . Current regimen:  o Pravastatin 8059maily . Interventions: o Take pravastatin at bedtime for maximum effectiveness . Patient self care activities - Over the next 90 days, patient will: o Take pravastatin at bedtime o Maintain positive lifestyle changes  Diabetes Lab Results  Component Value Date/Time   HGBA1C 6.7 (A) 10/08/2019 08:24 AM   HGBA1C 6.8 (H) 05/14/2019 12:00 AM   HGBA1C 6.5 01/01/2019 07:48 AM   HGBA1C 6.6 (A) 08/30/2018 07:46 AM   HGBA1C 6.6 08/30/2018 07:46 AM   HGBA1C 6.6 (A) 08/30/2018 07:46 AM   HGBA1C 6.6 08/30/2018 07:46 AM   . Pharmacist Clinical Goal(s): o Over the next 90 days, patient will work with PharmD and providers to maintain A1c goal <7% . Current regimen:  o Humalog 75/25 inject 50 units every morning and 20 units every evening o Trulicity inject 1.51.6XWekly . Interventions: o None . Patient self care activities - Over the next 90 days, patient will: o Check blood sugar once daily, document, and provide at future appointments o Contact provider with any episodes of hypoglycemia  Asthma . Pharmacist Clinical Goal(s) o Over the next 90 days, patient will work with PharmD and providers to improve breathing . Current regimen:  o Advair 250/50 mcg 1 puff by mouth twice daily o Ventolin HFA 2 puffs by mouth as needed . Interventions: o Counseled on WixLoraine patient assistance and Trelegy/Breztri not for asthma . Patient self care activities - Over the next 90 days, patient will: o Report to PharmD if rescue inhaler usage increases  Medication management . Pharmacist Clinical Goal(s): o Over the next 89 days, patient will work with PharmD and providers to maintain optimal medication adherence . Current pharmacy: [facility] . Interventions o Comprehensive medication review performed. o Continue current  medication management strategy . Patient self care activities - Over the next 90 days, patient will: o Focus on medication adherence by timing medications for most comfort and benefit o Take medications as prescribed o Report any questions or concerns to PharmD and/or provider(s)  Initial goal documentation       Diabetes   Recent Relevant Labs: Lab Results  Component Value Date/Time   HGBA1C 6.7 (A) 10/08/2019 08:24 AM   HGBA1C 6.8 (H) 05/14/2019 12:00 AM   HGBA1C 6.5 01/01/2019 07:48 AM   HGBA1C 6.6 (A) 08/30/2018 07:46 AM   HGBA1C 6.6 08/30/2018 07:46 AM   HGBA1C 6.6 (A) 08/30/2018 07:46 AM   HGBA1C 6.6 08/30/2018 07:46 AM   MICROALBUR 5.4 05/14/2019 12:00 AM   MICROALBUR 5.7 04/25/2018 09:29 AM   MICROALBUR 20 08/15/2015 08:12 AM     Checking BG: Rarely  Patient is currently controlled on the following medications: Humalog 75/96/04rulicity  Last diabetic Foot exam:  Lab Results  Component Value Date/Time   HMDIABEYEEXA No Retinopathy 09/02/2017  12:00 AM    Last diabetic Eye exam: No results found for: HMDIABFOOTEX   We discussed:  At goal FBG 73 - 115 Qpm 126 Patient assistance  Plan  Continue current medications   Hypertension    Office blood pressures are  BP Readings from Last 3 Encounters:  02/06/20 135/63  11/15/19 140/62  10/08/19 130/60    Patient has failed these meds in the past: NA  Patient checks BP at home infrequently  Patient home BP readings are ranging: NA  We discussed: Take Cardizem qhs and Avapro qam to reduce fatigue  Plan   Continue current medications     Hyperlipidemia   LDL goal < 70  Lipid Panel     Component Value Date/Time   CHOL 117 05/14/2019 0000   CHOL 139 04/16/2015 1138   TRIG 76 05/14/2019 0000   HDL 45 (L) 05/14/2019 0000   HDL 52 04/16/2015 1138   LDLCALC 56 05/14/2019 0000    Hepatic Function Latest Ref Rng & Units 05/14/2019 11/25/2017 12/23/2016  Total Protein 6.1 - 8.1 g/dL 6.6 6.4 6.9    Albumin 3.6 - 5.1 g/dL - - 3.7  AST 10 - 35 U/L '11 13 16  ' ALT 6 - 29 U/L 5(L) 7 8  Alk Phosphatase 33 - 130 U/L - - 90  Total Bilirubin 0.2 - 1.2 mg/dL 0.5 0.3 0.4  Bilirubin, Direct 0.0 - 0.2 mg/dL - 0.0 -     The ASCVD Risk score Mikey Bussing DC Jr., et al., 2013) failed to calculate for the following reasons:   The valid total cholesterol range is 130 to 320 mg/dL   Patient has failed these meds in past: NA Patient is currently controlled on the following medications:  . Pravastatin 21m daily  We discussed:   At goal Denies myalgias  Plan  Take pravastatin at bedtime Continue current medications    Asthma    Eosinophil count:   Lab Results  Component Value Date/Time   EOSPCT 2.7 05/14/2019 12:00 AM   EOSPCT 1.8 11/09/2013 11:37 AM   EOSPCT 4.2 03/16/2007 07:56 AM  %                               Eos (Absolute):  Lab Results  Component Value Date/Time   EOSABS 251 05/14/2019 12:00 AM   EOSABS 0.3 04/16/2015 11:38 AM   EOSABS 0.2 11/09/2013 11:37 AM    Tobacco Status:  Social History   Tobacco Use  Smoking Status Never Smoker  Smokeless Tobacco Never Used  Tobacco Comment   n/a    Patient has failed these meds in past: NS Patient is currently controlled on the following medications: Advair,  Using maintenance inhaler regularly? Yes Frequency of rescue inhaler use:  1-2x per week  We discussed:   Flonase 1 - 2 times weekly, at night Wixela for potential cost savings, patient to look at forums to assess equivalence to Advair Probably not candidate for Trelegy or Breztri due to no COPD  Plan  Continue current medications  Medication Management   [place] Uses pill box? Yes Pt endorses 100% compliance Disadvantaged to [employer]  We discussed:   Long time since last gout  Lasix tiwk  APAP 504mbid [place]  Eliquis PAP requires 3% of gross income spending Switch to Wi[place]waterno  Plan  Continue current  medication management strategy  Follow up: 3 month phone  visit  Milus Height, PharmD, [place], [facility] [phone]

## 2020-02-12 NOTE — Patient Instructions (Addendum)
Visit Information  Goals Addressed            This Visit's Progress   . Chronic Care Management       CARE PLAN ENTRY (see longitudinal plan of care for additional care plan information)  Current Barriers:  . Chronic Disease Management support, education, and care coordination needs related to Hypertension, Hyperlipidemia, Diabetes, and Asthma   Hypertension BP Readings from Last 3 Encounters:  02/06/20 135/63  11/15/19 140/62  10/08/19 130/60   . Pharmacist Clinical Goal(s): o Over the next 90 days, patient will work with PharmD and providers to maintain BP goal <130/80 . Current regimen:  o Cardizem CR 300mg  daily o Avapro 300mg  daily o Fatigue with these high doses . Interventions: o Try Cardizem in the morning and Avapro at night . Patient self care activities - Over the next 90 days, patient will: o Check BP weekly, document, and provide at future appointments o Ensure daily salt intake < 2300 mg/day o Call PharmD with any changes to level of fatigue  Hyperlipidemia Lab Results  Component Value Date/Time   LDLCALC 56 05/14/2019 12:00 AM   . Pharmacist Clinical Goal(s): o Over the next 90 days, patient will work with PharmD and providers to maintain LDL goal < 70 . Current regimen:  o Pravastatin 80mg  daily . Interventions: o Take pravastatin at bedtime for maximum effectiveness . Patient self care activities - Over the next 90 days, patient will: o Take pravastatin at bedtime o Maintain positive lifestyle changes  Diabetes Lab Results  Component Value Date/Time   HGBA1C 6.7 (A) 10/08/2019 08:24 AM   HGBA1C 6.8 (H) 05/14/2019 12:00 AM   HGBA1C 6.5 01/01/2019 07:48 AM   HGBA1C 6.6 (A) 08/30/2018 07:46 AM   HGBA1C 6.6 08/30/2018 07:46 AM   HGBA1C 6.6 (A) 08/30/2018 07:46 AM   HGBA1C 6.6 08/30/2018 07:46 AM   . Pharmacist Clinical Goal(s): o Over the next 90 days, patient will work with PharmD and providers to maintain A1c goal <7% . Current regimen:   o Humalog 75/25 inject 50 units every morning and 20 units every evening o Trulicity inject 1.5mg  weekly . Interventions: o None . Patient self care activities - Over the next 90 days, patient will: o Check blood sugar once daily, document, and provide at future appointments o Contact provider with any episodes of hypoglycemia  Asthma . Pharmacist Clinical Goal(s) o Over the next 90 days, patient will work with PharmD and providers to improve breathing . Current regimen:  o Advair 250/50 mcg 1 puff by mouth twice daily o Ventolin HFA 2 puffs by mouth as needed . Interventions: o Counseled on Amber Reyes on patient assistance and Trelegy/Breztri not for asthma . Patient self care activities - Over the next 90 days, patient will: o Report to PharmD if rescue inhaler usage increases  Medication management . Pharmacist Clinical Goal(s): o Over the next 89 days, patient will work with PharmD and providers to maintain optimal medication adherence . Current pharmacy: [facility] . Interventions o Comprehensive medication review performed. o Continue current medication management strategy . Patient self care activities - Over the next 90 days, patient will: o Focus on medication adherence by timing medications for most comfort and benefit o Take medications as prescribed o Report any questions or concerns to PharmD and/or provider(s)  Initial goal documentation        Amber Reyes was given information about Chronic Care Management services today including:  1. CCM service includes personalized  support from designated clinical staff supervised by her physician, including individualized plan of care and coordination with other care providers 2. 24/7 contact phone numbers for assistance for urgent and routine care needs. 3. Standard insurance, coinsurance, copays and deductibles apply for chronic care management only during months in which we provide at least 20 minutes of these  services. Most insurances cover these services at 100%, however patients may be responsible for any copay, coinsurance and/or deductible if applicable. This service may help you avoid the need for more expensive face-to-face services. 4. Only one practitioner may furnish and bill the service in a calendar month. 5. The patient may stop CCM services at any time (effective at the end of the month) by phone call to the office staff.  Patient agreed to services and verbal consent obtained.   Print copy of patient instructions provided.  Face to Face appointment with pharmacist scheduled for:  3 months  Milus Height, PharmD, [place], [facility] [phone]  [email or web address]">  Asthma, Adult  Asthma is a long-term (chronic) condition that causes recurrent episodes in which the airways become tight and narrow. The airways are the passages that lead from the nose and mouth down into the lungs. Asthma episodes, also called asthma attacks, can cause coughing, wheezing, shortness of breath, and chest pain. The airways can also fill with mucus. During an attack, it can be difficult to breathe. Asthma attacks can range from minor to life threatening. Asthma cannot be cured, but medicines and lifestyle changes can help control it and treat acute attacks. What are the causes? This condition is believed to be caused by inherited (genetic) and environmental factors, but its exact cause is not known. There are many things that can bring on an asthma attack or make asthma symptoms worse (triggers). Asthma triggers are different for each person. Common triggers include:  Mold.  Dust.  Cigarette smoke.  Cockroaches.  Things that can cause allergy symptoms (allergens), such as animal dander or pollen from trees or grass.  Air pollutants such as household cleaners, wood smoke, smog, or Advertising account planner.  Cold air, weather changes,  and winds (which increase molds and pollen in the air).  Strong emotional expressions such as crying or laughing hard.  Stress.  Certain medicines (such as aspirin) or types of medicines (such as beta-blockers).  Sulfites in foods and drinks. Foods and drinks that may contain sulfites include dried fruit, potato chips, and sparkling grape juice.  Infections or inflammatory conditions such as the flu, a cold, or inflammation of the nasal membranes (rhinitis).  Gastroesophageal reflux disease (GERD).  Exercise or strenuous activity. What are the signs or symptoms? Symptoms of this condition may occur right after asthma is triggered or many hours later. Symptoms include:  Wheezing. This can sound like whistling when you breathe.  Excessive nighttime or early morning coughing.  Frequent or severe coughing with a common cold.  Chest tightness.  Shortness of breath.  Tiredness (fatigue) with minimal activity. How is this diagnosed? This condition is diagnosed based on:  Your medical history.  A physical exam.  Tests, which may include: ? Lung function studies and pulmonary studies (spirometry). These tests can evaluate the flow of air in your lungs. ? Allergy tests. ? Imaging tests, such as X-rays. How is this treated? There is no cure for this condition, but treatment can help control your symptoms. Treatment for asthma usually involves:  Identifying and avoiding your asthma triggers.  Using medicines to control  your symptoms. Generally, two types of medicines are used to treat asthma: ? Controller medicines. These help prevent asthma symptoms from occurring. They are usually taken every day. ? Fast-acting reliever or rescue medicines. These quickly relieve asthma symptoms by widening the narrow and tight airways. They are used as needed and provide short-term relief.  Using supplemental oxygen. This may be needed during a severe episode.  Using other medicines, such  as: ? Allergy medicines, such as antihistamines, if your asthma attacks are triggered by allergens. ? Immune medicines (immunomodulators). These are medicines that help control the immune system.  Creating an asthma action plan. An asthma action plan is a written plan for managing and treating your asthma attacks. This plan includes: ? A list of your asthma triggers and how to avoid them. ? Information about when medicines should be taken and when their dosage should be changed. ? Instructions about using a device called a peak flow meter. A peak flow meter measures how well the lungs are working and the severity of your asthma. It helps you monitor your condition. Follow these instructions at home: Controlling your home environment Control your home environment in the following ways to help avoid triggers and prevent asthma attacks:  Change your heating and air conditioning filter regularly.  Limit your use of fireplaces and wood stoves.  Get rid of pests (such as roaches and mice) and their droppings.  Throw away plants if you see mold on them.  Clean floors and dust surfaces regularly. Use unscented cleaning products.  Try to have someone else vacuum for you regularly. Stay out of rooms while they are being vacuumed and for a short while afterward. If you vacuum, use a dust mask from a hardware store, a double-layered or microfilter vacuum cleaner bag, or a vacuum cleaner with a HEPA filter.  Replace carpet with wood, tile, or vinyl flooring. Carpet can trap dander and dust.  Use allergy-proof pillows, mattress covers, and box spring covers.  Keep your bedroom a trigger-free room.  Avoid pets and keep windows closed when allergens are in the air.  Wash beddings every week in hot water and dry them in a dryer.  Use blankets that are made of polyester or cotton.  Clean bathrooms and kitchens with bleach. If possible, have someone repaint the walls in these rooms with  mold-resistant paint. Stay out of the rooms that are being cleaned and painted.  Wash your hands often with soap and water. If soap and water are not available, use hand sanitizer.  Do not allow anyone to smoke in your home. General instructions  Take over-the-counter and prescription medicines only as told by your health care provider. ? Speak with your health care provider if you have questions about how or when to take the medicines. ? Make note if you are requiring more frequent dosages.  Do not use any products that contain nicotine or tobacco, such as cigarettes and e-cigarettes. If you need help quitting, ask your health care provider. Also, avoid being exposed to secondhand smoke.  Use a peak flow meter as told by your health care provider. Record and keep track of the readings.  Understand and use the asthma action plan to help minimize, or stop an asthma attack, without needing to seek medical care.  Make sure you stay up to date on your yearly vaccinations as told by your health care provider. This may include vaccines for the flu and pneumonia.  Avoid outdoor activities when allergen counts are  high and when air quality is low.  Wear a ski mask that covers your nose and mouth during outdoor winter activities. Exercise indoors on cold days if you can.  Warm up before exercising, and take time for a cool-down period after exercise.  Keep all follow-up visits as told by your health care provider. This is important. Where to find more information  For information about asthma, turn to the Centers for Disease Control and Prevention at http://www.clark.net/.htm  For air quality information, turn to AirNow at WeightRating.nl Contact a health care provider if:  You have wheezing, shortness of breath, or a cough even while you are taking medicine to prevent attacks.  The mucus you cough up (sputum) is thicker than usual.  Your sputum changes from clear or white to  yellow, green, gray, or bloody.  Your medicines are causing side effects, such as a rash, itching, swelling, or trouble breathing.  You need to use a reliever medicine more than 2-3 times a week.  Your peak flow reading is still at 50-79% of your personal best after following your action plan for 1 hour.  You have a fever. Get help right away if:  You are getting worse and do not respond to treatment during an asthma attack.  You are short of breath when at rest or when doing very little physical activity.  You have difficulty eating, drinking, or talking.  You have chest pain or tightness.  You develop a fast heartbeat or palpitations.  You have a bluish color to your lips or fingernails.  You are light-headed or dizzy, or you faint.  Your peak flow reading is less than 50% of your personal best.  You feel too tired to breathe normally. Summary  Asthma is a long-term (chronic) condition that causes recurrent episodes in which the airways become tight and narrow. These episodes can cause coughing, wheezing, shortness of breath, and chest pain.  Asthma cannot be cured, but medicines and lifestyle changes can help control it and treat acute attacks.  Make sure you understand how to avoid triggers and how and when to use your medicines.  Asthma attacks can range from minor to life threatening. Get help right away if you have an asthma attack and do not respond to treatment with your usual rescue medicines. This information is not intended to replace advice given to you by your health care provider. Make sure you discuss any questions you have with your health care provider. Document Revised: 10/12/2018 Document Reviewed: 09/13/2016 Elsevier Patient Education  2020 [employer].

## 2020-02-21 DIAGNOSIS — G4733 Obstructive sleep apnea (adult) (pediatric): Secondary | ICD-10-CM | POA: Diagnosis not present

## 2020-02-27 ENCOUNTER — Other Ambulatory Visit: Payer: Self-pay

## 2020-02-27 DIAGNOSIS — R809 Proteinuria, unspecified: Secondary | ICD-10-CM

## 2020-02-27 DIAGNOSIS — E1129 Type 2 diabetes mellitus with other diabetic kidney complication: Secondary | ICD-10-CM

## 2020-02-27 MED ORDER — INSULIN LISPRO PROT & LISPRO (75-25 MIX) 100 UNIT/ML KWIKPEN: 38.0000 [IU] | PEN_INJECTOR | Freq: Every day | SUBCUTANEOUS | 3 refills | Status: DC

## 2020-03-04 ENCOUNTER — Telehealth: Payer: Self-pay | Admitting: Family Medicine

## 2020-03-04 NOTE — Telephone Encounter (Signed)
Pt has called an is very concerned as states the C-Pap machine that Dr S had ordered for her, she states received a letter stating it was being recalled. She does not know why but did not use it last nite and got no rest at all. She would like Dr Chauncey Cruel or her nurse to reach out to let her know what she should do nitely and if another machine needs to be ordered. FU at [phone]  Is a Hardin Negus.

## 2020-03-05 ENCOUNTER — Telehealth: Payer: Self-pay

## 2020-03-05 ENCOUNTER — Other Ambulatory Visit: Payer: Self-pay

## 2020-03-05 NOTE — Telephone Encounter (Signed)
Patient is checking status on message below.

## 2020-03-05 NOTE — Telephone Encounter (Signed)
Spoke with patient, she was concerned because her CPAP machine has a recall according to the manufacturer and she was told by medical supply/maintenance that she needed to contact her PCP for a new prescription so that she could get a new machine.

## 2020-03-06 ENCOUNTER — Telehealth: Payer: Self-pay | Admitting: Cardiovascular Disease

## 2020-03-06 ENCOUNTER — Telehealth: Payer: Self-pay

## 2020-03-06 DIAGNOSIS — Z794 Long term (current) use of insulin: Secondary | ICD-10-CM

## 2020-03-06 DIAGNOSIS — E1129 Type 2 diabetes mellitus with other diabetic kidney complication: Secondary | ICD-10-CM

## 2020-03-06 MED ORDER — TRULICITY 1.5 MG/0.5ML ~~LOC~~ SOAJ: 1.5000 mg | SUBCUTANEOUS | 3 refills | Status: DC

## 2020-03-06 NOTE — Addendum Note (Signed)
Addended by: Lennie Muckle on: 03/06/2020 01:55 PM   Modules accepted: Orders

## 2020-03-06 NOTE — Telephone Encounter (Signed)
Called and spoke with patient. Called and spoke with [facility]. Sent message to Dr.Sowles about patient needing cpap machine. Waiting on response.

## 2020-03-06 NOTE — Telephone Encounter (Signed)
I spoke with patient in regards to issues about recall on her cpap machine. I had to call [facility] to find out exactly what is going on and why has she not received a new device. Patient uses this device nightly and has been unable to sleep without it. H[employer]y Romans said that they don't have any idea or timeframe as to when patient's will get another machine. She said Hardin Negus has not told them what they plan to do about the recall. Dr.Sowles we have a lot of patient's that use [facility] as for there CPAP. What do you advise that patient's do about not being able to use there machines at all. Is there another company that we can use. Please advise.

## 2020-03-06 NOTE — Telephone Encounter (Signed)
Here you go

## 2020-03-06 NOTE — Telephone Encounter (Signed)
Can you please help with this?  Thanks.

## 2020-03-06 NOTE — Telephone Encounter (Signed)
Patient wants advise from Dr. Fletcher Anon re cpap   Current machine was recalled and new machine is not available yet.    Patient wants to know if this is dangerous to be without it today is day 3 no cpap.    Please call.

## 2020-03-06 NOTE — Telephone Encounter (Signed)
Mrs. Taplin uses [facility] and needs a new prescription for her Trulicity to be faxed to [phone]. She said she has reached out numerous times about her prescription and they have not received one yet. Could you please print a prescription so that I can fax it to [place].

## 2020-03-06 NOTE — Telephone Encounter (Signed)
Spoke with both the DME vendor, [employer], and the manufacturer, [employer], on today and was informed by both that a plan has not come out yet as to when a replacement or repair process will be. Once a date has been set [employer] will contact the patient directly.

## 2020-03-06 NOTE — Telephone Encounter (Signed)
Prescription printed and faxed per request.

## 2020-03-06 NOTE — Telephone Encounter (Signed)
Spoke with patient and she called in due to recall on her CPAP machine. She states that the recall mentioned symptoms as well as what to watch for. Reviewed that we do not manage or deal with those machines but it is very important to discuss with her pulmonary doctor. She then inquired if it would be harmful to her heart if not wearing it. Reviewed that not wearing it could cause more workload on the heart and should be avoided if at all possible. Instructed her to keep machine clean, monitor closely, and contact pulmonary for further instruction. She verbalized understanding of our conversation and provided her with number to pulmonary office.

## 2020-03-10 ENCOUNTER — Telehealth: Payer: Self-pay | Admitting: Family Medicine

## 2020-03-10 NOTE — Telephone Encounter (Signed)
Pt called in and stated [facility] told her that we could send in another script to [place] for CPAP  and see if they can get it approved with her ins compy.   Phone number [phone]- laura  She did not have a fax number

## 2020-03-10 NOTE — Telephone Encounter (Signed)
[facility] calling to get clarification on the directions for Insulin Lispro Prot & Lispro (HUMALOG MIX 75/25 KWIKPEN) (75-25) 100 UNIT/ML [employer]

## 2020-03-12 NOTE — Telephone Encounter (Signed)
I can't find your pads.

## 2020-03-12 NOTE — Telephone Encounter (Signed)
Called pharmacy to give clarification on Humalog. He said that medication is not covered under plan. Alternative would be Novolog 70/30. Please advise.

## 2020-03-13 ENCOUNTER — Other Ambulatory Visit: Payer: Self-pay | Admitting: Family Medicine

## 2020-03-13 DIAGNOSIS — E1122 Type 2 diabetes mellitus with diabetic chronic kidney disease: Secondary | ICD-10-CM

## 2020-03-13 DIAGNOSIS — N183 Chronic kidney disease, stage 3 unspecified: Secondary | ICD-10-CM

## 2020-03-13 MED ORDER — NOVOLOG MIX 70/30 FLEXPEN (70-30) 100 UNIT/ML ~~LOC~~ SUPN: 20.0000 [IU] | PEN_INJECTOR | Freq: Two times a day (BID) | SUBCUTANEOUS | 11 refills | Status: DC

## 2020-03-13 NOTE — Telephone Encounter (Signed)
Miel can you get a rx out for the cpap.

## 2020-03-13 NOTE — Telephone Encounter (Signed)
Prescription is in your green folder for signature and fax.

## 2020-03-13 NOTE — Telephone Encounter (Signed)
I'm sorry these has already been handled. [employer] does not cover her Humalog it goes through [employer]. Dr. Roxan Hockey printed a rx for this earlier this week and it has been faxed to [facility].

## 2020-03-17 NOTE — Telephone Encounter (Signed)
RX signed by Dr. Ancil Boozer on today, 03/17/2020, and it has been faxed to [facility] @ [phone] to the attention of Mickel Baas.

## 2020-03-23 DIAGNOSIS — G4733 Obstructive sleep apnea (adult) (pediatric): Secondary | ICD-10-CM | POA: Diagnosis not present

## 2020-03-24 NOTE — Telephone Encounter (Signed)
Pt is calling and would like someone  to go to [email or web address] website and print the application off and she will come to office to complete her portion of application. Pt will also bring her income verification so that we can fax to [facility] patient gets trulicity  and humalog from [facility]. Pt has about one week of trulicity left. Pt put me on hold and she is talking to ted the pharmacist.  I was on hold for over 20 min waiting for the patient to come back on the line

## 2020-03-26 ENCOUNTER — Other Ambulatory Visit: Payer: Self-pay | Admitting: Family Medicine

## 2020-03-26 ENCOUNTER — Telehealth: Payer: Self-pay

## 2020-03-26 DIAGNOSIS — E1122 Type 2 diabetes mellitus with diabetic chronic kidney disease: Secondary | ICD-10-CM

## 2020-03-26 DIAGNOSIS — N183 Chronic kidney disease, stage 3 unspecified: Secondary | ICD-10-CM

## 2020-03-26 MED ORDER — NOVOLOG MIX 70/30 FLEXPEN (70-30) 100 UNIT/ML ~~LOC~~ SUPN: 20.0000 [IU] | PEN_INJECTOR | Freq: Two times a day (BID) | SUBCUTANEOUS | 11 refills | Status: DC

## 2020-03-26 NOTE — Telephone Encounter (Signed)
Copied from [place] [phone]. Topic: General - Other >> Mar 26, 2020  1:38 PM Rainey Pines A wrote: Patient state that she needs order for insulin resent to [facility] at [phone]. Patient stated that [facility] still does not haev information   RX placed on computer on alcove for signature and fax number on the back.

## 2020-03-27 ENCOUNTER — Other Ambulatory Visit: Payer: Self-pay

## 2020-03-27 NOTE — Telephone Encounter (Signed)
Rx has been signed and faxed to [facility].

## 2020-04-03 ENCOUNTER — Telehealth: Payer: Self-pay | Admitting: Family Medicine

## 2020-04-03 NOTE — Telephone Encounter (Signed)
Peter Congo calling from [facility] is calling to verify if the patient is still taking HUMALOG MIX 75/25 KWIKPEN (75-25) 100 UNIT/ML Claiborne Rigg [[id number]] DISCONTINUED Please advise Cb-- [phone]  Anyone can answer.

## 2020-04-04 ENCOUNTER — Other Ambulatory Visit: Payer: Self-pay | Admitting: Family Medicine

## 2020-04-04 DIAGNOSIS — E1129 Type 2 diabetes mellitus with other diabetic kidney complication: Secondary | ICD-10-CM

## 2020-04-04 MED ORDER — INSULIN LISPRO PROT & LISPRO (75-25 MIX) 100 UNIT/ML KWIKPEN: 22.0000 [IU] | PEN_INJECTOR | Freq: Two times a day (BID) | SUBCUTANEOUS | 2 refills | Status: DC

## 2020-04-09 NOTE — Telephone Encounter (Signed)
Pt wants to know if her  Dulaglutide (TRULICITY) 1.5 LU/9.4PT SOPN Was sent to [facility] for approval?

## 2020-04-14 ENCOUNTER — Other Ambulatory Visit: Payer: Self-pay | Admitting: Family Medicine

## 2020-04-14 DIAGNOSIS — E1129 Type 2 diabetes mellitus with other diabetic kidney complication: Secondary | ICD-10-CM

## 2020-04-14 NOTE — Telephone Encounter (Signed)
Requested Prescriptions  Pending Prescriptions Disp Refills   DROPLET PEN NEEDLES 30G X 8 MM MISC [Pharmacy Med Name: DROPLET PEN NEEDLES 30G X5/16" 30G X 8 MM] 200 each 0    Sig: USE TWICE DAILY WITH INSULIN     Endocrinology: Diabetes - Testing Supplies Passed - 04/14/2020 11:28 PM      Passed - Valid encounter within last 12 months    Recent Outpatient Visits          2 months ago Controlled type 2 diabetes mellitus with microalbuminuria, with long-term current use of insulin [facility])   [facility] [place], Drue Stager, MD   6 months ago Controlled type 2 diabetes mellitus with microalbuminuria, with long-term current use of insulin [facility])   [facility] [place], Drue Stager, MD   11 months ago Controlled type 2 diabetes mellitus with microalbuminuria, with long-term current use of insulin [facility])   [facility] [place], Drue Stager, MD   1 year ago Controlled type 2 diabetes mellitus with microalbuminuria, with long-term current use of insulin [facility])   [facility] [place], Drue Stager, MD   1 year ago Controlled type 2 diabetes mellitus with stage 3 chronic kidney disease, with long-term current use of insulin [facility])   [facility] Steele Sizer, MD      Future Appointments            In 4 weeks Ancil Boozer, Drue Stager, MD [facility] [place]   In 2 months  [facility]

## 2020-04-21 ENCOUNTER — Other Ambulatory Visit: Payer: Self-pay

## 2020-04-21 DIAGNOSIS — E1129 Type 2 diabetes mellitus with other diabetic kidney complication: Secondary | ICD-10-CM

## 2020-04-21 DIAGNOSIS — R809 Proteinuria, unspecified: Secondary | ICD-10-CM

## 2020-04-21 MED ORDER — INSULIN LISPRO PROT & LISPRO (75-25 MIX) 100 UNIT/ML KWIKPEN: 22.0000 [IU] | PEN_INJECTOR | Freq: Two times a day (BID) | SUBCUTANEOUS | 1 refills | Status: DC

## 2020-04-21 NOTE — Telephone Encounter (Signed)
They had an old prescription for 36 units. This recent prescription went to [facility] not [facility]. They want a four month supply with refills.

## 2020-04-21 NOTE — Telephone Encounter (Signed)
Copied from [place] [phone]. Topic: General - Other >> Apr 21, 2020  9:55 AM Leward Quan A wrote: Reason for CRM: A rep wit [facility] with the [place] program  called to get clarification on the instructions for Insulin Lispro Prot & Lispro (HUMALOG MIX 75/25 KWIKPEN) (75-25) 100 UNIT/ML [employer] asking for a call back from the nurse at Ph# [phone]   They are requesting a 4 month supply with additional refills

## 2020-04-22 ENCOUNTER — Other Ambulatory Visit: Payer: Self-pay

## 2020-04-22 ENCOUNTER — Telehealth: Payer: Self-pay | Admitting: Cardiovascular Disease

## 2020-04-22 DIAGNOSIS — Z86010 Personal history of colonic polyps: Secondary | ICD-10-CM

## 2020-04-22 NOTE — Telephone Encounter (Signed)
   Primary Cardiologist: Kathlyn Sacramento, MD  Chart reviewed as part of pre-operative protocol coverage. Patient was contacted 04/22/2020 in reference to pre-operative risk assessment for pending surgery as outlined below.  Amber Reyes was last seen on 11/15/19 by Dr. Fletcher Anon. Per chart, h/o chronic diastolic CHF, chronic atrial fib, DM, HTN, CKD, sleep apnea, anemia, pericardial effusion requiring window and morbid obesity. Per our discussion, she denies any new cardiac symptoms. Therefore, based on ACC/AHA guidelines, the patient would be at acceptable risk for the planned procedure without further cardiovascular testing.   The patient was advised that if she develops new symptoms prior to surgery to contact our office to arrange for a follow-up visit, and she verbalized understanding. The performing surgical team should also be aware of patient's history of OSA when choosing sedation.  Will route to pharm for input on Eliquis.  Charlie Pitter, PA-C 04/22/2020, 3:30 PM

## 2020-04-22 NOTE — Telephone Encounter (Signed)
   [facility] Pre-operative Risk Assessment    HEARTCARE STAFF: - Please ensure there is not already an duplicate clearance open for this procedure. - Under Visit Info/Reason for Call, type in Other and utilize the format Clearance MM/DD/YY or Clearance TBD. Do not use dashes or single digits. - If request is for dental extraction, please clarify the # of teeth to be extracted.  Request for surgical clearance:  1. What type of surgery is being performed? colonoscopy   2. When is this surgery scheduled? 05/09/20  3. What type of clearance is required (medical clearance vs. Pharmacy clearance to hold med vs. Both)? pharm  4. Are there any medications that need to be held prior to surgery and how long? Advise on eliquis instructions  5. Practice name and name of physician performing surgery? [facility]  6. What is the office phone number? [phone]   7.   What is the office fax number? [phone]  8.   Anesthesia type (None, local, MAC, general) ? Not noted   Marykay Lex 04/22/2020, 2:02 PM  _________________________________________________________________   (provider comments below)

## 2020-04-23 ENCOUNTER — Telehealth: Payer: Medicare PPO

## 2020-04-23 DIAGNOSIS — G4733 Obstructive sleep apnea (adult) (pediatric): Secondary | ICD-10-CM | POA: Diagnosis not present

## 2020-04-23 NOTE — Telephone Encounter (Signed)
Patient with diagnosis of afib on Eliquis for anticoagulation.    Procedure: colonoscopy  Date of procedure: 05/09/20  CHADS2-VASc score of  5 (CHF, HTN, AGE, DM2, female)  CrCl 66 ml/mi  Per office protocol, patient can hold Eliquis for 1 day prior to procedure.

## 2020-04-23 NOTE — Telephone Encounter (Signed)
   Primary Cardiologist: Kathlyn [place], MD  Chart reviewed as part of pre-operative protocol coverage. Given past medical history and time since last visit as well as conversation with patient, based on ACC/AHA guidelines, [name] would be at acceptable risk for the planned procedure without further cardiovascular testing.   The patient was advised that if she develops new symptoms prior to surgery to contact our office to arrange for a follow-up visit, and she verbalized understanding. The performing surgical team should also be aware of patient's history of OSA when choosing sedation.  Per office protocol, patient can hold Eliquis for 1 day prior to procedure.    I will route this recommendation to the requesting party via Epic fax function and remove from pre-op pool.  Please call with questions.  Charlie Pitter, PA-C 04/23/2020, 8:16 AM

## 2020-04-24 ENCOUNTER — Telehealth: Payer: Self-pay

## 2020-04-24 NOTE — Telephone Encounter (Signed)
Patient has been advised per Dr. Fletcher Anon to stop Eliquis 1 day prior to colonoscopy. Pt verbalized understanding.  Thanks,  [place], [place]

## 2020-05-05 ENCOUNTER — Telehealth: Payer: Self-pay | Admitting: *Deleted

## 2020-05-05 NOTE — Chronic Care Management (AMB) (Signed)
  Chronic Care Management   Note  05/05/2020 Name: Amber Reyes MRN: [id number] DOB: Feb 14, 1946  Amber Reyes is a 74 y.o. year old female who is a primary care patient of Steele Sizer, MD and is actively engaged with the care management team. I reached out to [name] by phone today to assist with re-scheduling a follow up visit with the Pharmacist.  Follow up plan: A telephone outreach attempt made patient could not talk. Ardis Lawley took down call back information and stated she would return call. The care management team will reach out to the patient again over the next 7 days. If patient returns call to provider office, please advise to call [place] [facility]

## 2020-05-07 ENCOUNTER — Telehealth: Payer: Self-pay

## 2020-05-07 NOTE — Chronic Care Management (AMB) (Signed)
Contacted patient to check on status of paperwork for patient assistant program for Eliquis and Advair. During our conversation, the issue with her CPAP was brought up.  She is still not using it, having not heard anything more regarding a filter for the line until Doren Custard can send a replacement machine. She is complaining of a weird taste in her mouth and very dry feeling in her mouth and throat that causes her to wake up during the night to sip water repeatedly. Patient has an appointment with Dr Ancil Boozer on Monday 05/12/2020 and would like to address this is detail with her then.   Also of note, patient took her last dulaglutide shot today. Contacted [employer] to find out status of PAP. They are awaiting provider portion of application. It was faxed 05/01/2020 at 5:01 with confirmation. Re-faxed application. [employer] did mark the application as an urgent need.

## 2020-05-09 NOTE — Progress Notes (Signed)
Name: Amber Reyes   MRN: [id number]    DOB: [birth year]   Date:05/12/2020       Progress Note  Subjective  Chief Complaint  Chief Complaint  Patient presents with   Diabetes   Hypertension   Hyperlipidemia    HPI   DMII controlled: She has been compliant with medication,she is on  Humalog 75/25 since  Feb 2018, also started on Trulicity in 3536 [id number]%5.0% today is 6.9% Taking Avapro for nephropathy, still has some microalbuminuria, last level 78, she is on statin for dyslipidemia.No polyphagia, polydipsia or polyuria..Fasting in the79-113 . She had one episode of hypoglycemic episode because she had a light breakfast, but usually no problems.   HTN/CHF/Afib/Pulmonary hypertension : taking medications and denies side effects of medications, bp is at goal, usually in the 130's/70's she denies chest painno recentpalpitation, found to have Afib on EKG done by Dr. Fletcher Anon, she states unable to use her CPAP lately . She has orthopnea- uses two pillows, SOB is stable, and edema has been stable. Echo done 11/2019LV function is normaland unable to assess diastolic dysfunction, severe leftatrium dilation , mild elevation of Pulmonary pressure. She is currently on Eliquisand also cardizem for  Afib and is rate controlled. We will recheck labs today   Hyperlipidemia: taking Pravastatin, we will continue current medication,reviewed labs done09/2020LDL was 56, she is fasting and will have repeat labs today .No myalgia.  RLS: symptoms are still present, described as soreness on both legs at night, improves when she rubs her legs. She was having cramps after taking lasix, but is currently taking lasix every other day and has been doing better.  CKIII: [facility], last labs reviewed still stage III CKI with mild drop of GFR , no itching , good urine output, she is not taking potassium because she had hyperkalemia , she has  secondary hyperparathyroidism and we will recheck levels today   OSA: she was wearing CPAP machineevery night however since June her machine has been recalled and she has been unable to get a replacement of the part, she is waking feeling tired. She has contacted her insurance, [employer] and machine hotline without any help. Discussed referring her back to pulmonologist and find out if she can break her contract with [employer]  Morbid Obesity:unable to check her weight today, hopefully it will help her weight loss with decrease in insulin and better diet.She was getting Trulicity through the [employer], the applications expired but we will send a new application today. Weight has been stable.   Asthma Moderate persistent/Chronic bronchitis: . She is compliant and uses Advair twice daily as prescribed, no side effects. No wheezing , but she has a morning cough and sob only with activity , she states worse during the Summer with humidity, also when pollen count is high, she states doing well, she uses rescue inhaler at most once a week.   Atherosclerosis of aorta and echo showed mild dilation of ascending aorta, continue statin and eliquis.We will recheck labs today    Patient Active Problem List   Diagnosis Date Noted   Hypertension associated with stage 3 chronic kidney disease due to type 2 diabetes mellitus ([place]) 02/06/2020   History of colonic polyps    Polyp of colon    Thoracic aortic atherosclerosis ([place]) 12/30/2016   Controlled type 2 diabetes mellitus with stage 3 chronic kidney disease ([place]) 12/23/2016   Venous insufficiency 05/21/2016   Hypertensive heart disease 05/21/2016   Anemia  in chronic kidney disease 12/22/2015   Pulmonary hypertension ([place]) 12/18/2015   Xanthelasma 04/15/2015   Anxiety 04/12/2015   Chronic kidney disease (CKD), stage III (moderate) 04/12/2015   Edema extremities 04/12/2015   Disc disorder of lumbar region 04/12/2015   Asthma,  moderate persistent 04/12/2015   Morbid obesity, unspecified obesity type ([place]) 04/12/2015   Obstructive apnea 04/12/2015   Restless leg 04/12/2015   Allergic rhinitis 04/12/2015   Vitamin D deficiency 04/12/2015   Controlled gout 04/12/2015   Premature atrial contractions 11/29/2013   Atrial fibrillation ([place]) 11/09/2013   Benign essential hypertension    Hyperlipidemia     Past Surgical History:  Procedure Laterality Date   CARDIAC CATHETERIZATION  2002   [facility]   COLONOSCOPY WITH PROPOFOL N/A 09/25/2018   Procedure: COLONOSCOPY WITH PROPOFOL;  Surgeon: Jonathon Bellows, MD;  Location: [facility] ENDOSCOPY;  Service: Gastroenterology;  Laterality: N/A;   COLONOSCOPY WITH PROPOFOL N/A 04/13/2019   Procedure: COLONOSCOPY WITH PROPOFOL;  Surgeon: Jonathon Bellows, MD;  Location: [facility] ENDOSCOPY;  Service: Gastroenterology;  Laterality: N/A;   PERICARDIUM SURGERY      Family History  Problem Relation Age of Onset   Heart attack Mother    Hypertension Mother    Breast cancer Neg Hx     Social History   Tobacco Use   Smoking status: Never Smoker   Smokeless tobacco: Never Used   Tobacco comment: n/a  Substance Use Topics   Alcohol use: No    Alcohol/week: 0.0 standard drinks     Current Outpatient Medications:    albuterol (PROVENTIL HFA;VENTOLIN HFA) 108 (90 BASE) MCG/ACT inhaler, Inhale 2 puffs into the lungs every 6 (six) hours as needed for wheezing or shortness of breath., Disp: , Rfl:    allopurinol (ZYLOPRIM) 100 MG tablet, Take 1 tablet (100 mg total) by mouth 2 (two) times daily., Disp: 180 tablet, Rfl: 1   Blood Glucose Calibration (TRUE METRIX LEVEL 1) Low SOLN, 1 each by In Vitro route once a week., Disp: 1 each, Rfl: 1   Blood Glucose Monitoring Suppl (TRUE METRIX AIR GLUCOSE METER) w/Device KIT, Inject 1 each into the skin 4 (four) times daily as needed. Check fsbs 4 times daily E11.22, Disp: 1 kit, Rfl: 0   Cholecalciferol (VITAMIN D) 2000 UNITS tablet,  Take 2,000 Units by mouth daily., Disp: , Rfl:    diltiazem (CARDIZEM CD) 300 MG 24 hr capsule, Take 1 capsule (300 mg total) by mouth daily., Disp: 90 capsule, Rfl: 1   DROPLET PEN NEEDLES 30G X 8 MM MISC, USE TWICE DAILY WITH INSULIN, Disp: 200 each, Rfl: 3   Elastic Bandages & Supports (MEDICAL COMPRESSION STOCKINGS) MISC, 2 each by Does not apply route daily., Disp: 2 each, Rfl: 2   ELIQUIS 5 MG TABS tablet, TAKE 1 TABLET TWICE DAILY, Disp: 180 tablet, Rfl: 1   fluticasone (FLONASE) 50 MCG/ACT nasal spray, USE 2 SPRAYS IN EACH NOSTRIL EVERY DAY, Disp: 48 g, Rfl: 2   Fluticasone-Salmeterol (ADVAIR DISKUS) 250-50 MCG/DOSE AEPB, Inhale 1 puff into the lungs 2 (two) times daily., Disp: 3 each, Rfl: 1   furosemide (LASIX) 40 MG tablet, TAKE 1 TABLET (40 MG TOTAL) BY MOUTH 2 (TWO) TIMES DAILY AS NEEDED., Disp: 180 tablet, Rfl: 1   Insulin Lispro Prot & Lispro (HUMALOG MIX 75/25 KWIKPEN) (75-25) 100 UNIT/ML Kwikpen, Inject 22-50 Units into the skin 2 (two) times daily with a meal. 50 units before breakfast and 22 before dinner, Disp: 45 mL, Rfl: 1  irbesartan (AVAPRO) 300 MG tablet, Take 1 tablet (300 mg total) by mouth daily., Disp: 90 tablet, Rfl: 1   pravastatin (PRAVACHOL) 80 MG tablet, Take 1 tablet (80 mg total) by mouth every evening., Disp: 90 tablet, Rfl: 1   colchicine (COLCRYS) 0.6 MG tablet, Take 1-2 tablets (0.6-1.2 mg total) by mouth daily as needed. (Patient not taking: Reported on 02/12/2020), Disp: 30 tablet, Rfl: 0   Dulaglutide (TRULICITY) 1.5 DE/0.8XK SOPN, Inject 0.5 mLs (1.5 mg total) into the skin once a week. (Patient not taking: Reported on 05/12/2020), Disp: 9 mL, Rfl: 3  No Known Allergies  I personally reviewed active problem list, medication list, allergies, family history, social history, health maintenance with the patient/caregiver today.   ROS  Constitutional: Negative for fever or weight change.  Respiratory: Negative for cough and shortness of breath.    Cardiovascular: Negative for chest pain or palpitations.  Gastrointestinal: Negative for abdominal pain, no bowel changes.  Musculoskeletal: Negative for gait problem or joint swelling.  Skin: Negative for rash.  Neurological: Negative for dizziness or headache.  No other specific complaints in a complete review of systems (except as listed in HPI above).   Objective  Vitals:   05/12/20 0808  BP: 132/74  Pulse: 71  Resp: 16  Temp: 98.1 F (36.7 C)  SpO2: 99%  Weight: (!) 387 lb 11.2 oz (175.9 kg)  Height: '5\' 10"'  (1.778 m)    Body mass index is 55.63 kg/m.  Physical Exam  Constitutional: Patient appears well-developed and well-nourished. Obese  No distress.  HEENT: head atraumatic, normocephalic, pupils equal and reactive to light, neck supple Cardiovascular: Normal rate, regular rhythm and normal heart sounds.  No murmur heard. No BLE edema. Pulmonary/Chest: Effort normal and breath sounds normal. No respiratory distress. Abdominal: Soft.  There is no tenderness. Psychiatric: Patient has a normal mood and affect. behavior is normal. Judgment and thought content normal.  PHQ2/9: Depression screen [facility] 2/9 05/12/2020 02/06/2020 10/08/2019 06/15/2019 05/08/2019  Decreased Interest 0 0 0 0 0  Down, Depressed, Hopeless 0 0 0 0 0  PHQ - 2 Score 0 0 0 0 0  Altered sleeping 0 0 0 - 0  Tired, decreased energy 2 0 0 - 0  Change in appetite 0 0 0 - 0  Feeling bad or failure about yourself  0 0 0 - 0  Trouble concentrating 0 0 0 - 0  Moving slowly or fidgety/restless 0 0 0 - 0  Suicidal thoughts 0 0 0 - 0  PHQ-9 Score 2 0 0 - 0  Difficult doing work/chores Not difficult at all Not difficult at all Not difficult at all - -  Some recent data might be hidden    phq 9 is negative   Fall Risk: Fall Risk  05/12/2020 02/06/2020 10/08/2019 06/15/2019 05/08/2019  Falls in the past year? 0 0 0 0 0  Number falls in past yr: 0 0 0 0 0  Injury with Fall? 0 0 0 0 0  Follow up - Falls  evaluation completed - Falls prevention discussed -     Functional Status Survey: Is the patient deaf or have difficulty hearing?: No Does the patient have difficulty seeing, even when wearing glasses/contacts?: No Does the patient have difficulty concentrating, remembering, or making decisions?: No Does the patient have difficulty walking or climbing stairs?: Yes Does the patient have difficulty dressing or bathing?: No Does the patient have difficulty doing errands alone such as visiting a doctor's office or shopping?:  No    Assessment & Plan  1. Controlled type 2 diabetes mellitus with microalbuminuria, with long-term current use of insulin (HCC)  - POCT HgB A1C - POCT UA - Microalbumin - Dulaglutide (TRULICITY) 1.5 OP/0.2HI SOPN; Inject 1.5 mg into the skin once a week.  Dispense: 6 mL; Refill: 3 - Insulin Lispro Prot & Lispro (HUMALOG MIX 75/25 KWIKPEN) (75-25) 100 UNIT/ML Kwikpen; Inject 22-50 Units into the skin 2 (two) times daily with a meal. 50 units before breakfast and 22 before dinner  Dispense: 45 mL; Refill: 3  2. Morbid obesity ([place])  Discussed with the patient the risk posed by an increased BMI. Discussed importance of portion control, calorie counting and at least 150 minutes of physical activity weekly. Avoid sweet beverages and drink more water. Eat at least 6 servings of fruit and vegetables daily   3. Benign essential hypertension  - COMPLETE METABOLIC PANEL WITH GFR  4. Dyslipidemia associated with type 2 diabetes mellitus ([place])  - Lipid panel  5. Medication monitoring encounter  - Lipid panel - COMPLETE METABOLIC PANEL WITH GFR  6. Need for immunization against influenza  - Flu Vaccine QUAD High Dose(Fluad)  7. Atrial fibrillation, unspecified type (HCC)  - apixaban (ELIQUIS) 5 MG TABS tablet; Take 1 tablet (5 mg total) by mouth 2 (two) times daily.  Dispense: 180 tablet; Refill: 3  8. Controlled gout   9. Hypertensive heart disease with  heart failure ([place])  At goal   10. Pulmonary hypertension ([place])   11. Obstructive apnea  We will send her to pulmonologist to help her with cpap recall  12. Moderate persistent asthma without complication  - Fluticasone-Salmeterol (ADVAIR DISKUS) 250-50 MCG/DOSE AEPB; Inhale 1 puff into the lungs 2 (two) times daily.  Dispense: 3 each; Refill: 1  13. Thoracic aortic atherosclerosis ([place])   14. Stage 3a chronic kidney disease  - COMPLETE METABOLIC PANEL WITH GFR - VITAMIN D 25 Hydroxy (Vit-D Deficiency, Fractures) - CBC with Differential/Platelet - Parathyroid hormone, intact (no Ca)  15. Secondary hyperparathyroidism of renal origin [facility])  [place] labs today

## 2020-05-12 ENCOUNTER — Ambulatory Visit (INDEPENDENT_AMBULATORY_CARE_PROVIDER_SITE_OTHER): Payer: Medicare PPO | Admitting: Family Medicine

## 2020-05-12 ENCOUNTER — Other Ambulatory Visit: Payer: Self-pay

## 2020-05-12 ENCOUNTER — Encounter: Payer: Self-pay | Admitting: Family Medicine

## 2020-05-12 VITALS — BP 132/74 | HR 71 | Temp 98.1°F | Resp 16 | Ht 70.0 in | Wt 387.7 lb

## 2020-05-12 DIAGNOSIS — Z794 Long term (current) use of insulin: Secondary | ICD-10-CM | POA: Diagnosis not present

## 2020-05-12 DIAGNOSIS — M109 Gout, unspecified: Secondary | ICD-10-CM

## 2020-05-12 DIAGNOSIS — Z23 Encounter for immunization: Secondary | ICD-10-CM | POA: Diagnosis not present

## 2020-05-12 DIAGNOSIS — I4891 Unspecified atrial fibrillation: Secondary | ICD-10-CM

## 2020-05-12 DIAGNOSIS — N2581 Secondary hyperparathyroidism of renal origin: Secondary | ICD-10-CM

## 2020-05-12 DIAGNOSIS — E1169 Type 2 diabetes mellitus with other specified complication: Secondary | ICD-10-CM | POA: Diagnosis not present

## 2020-05-12 DIAGNOSIS — E785 Hyperlipidemia, unspecified: Secondary | ICD-10-CM

## 2020-05-12 DIAGNOSIS — G4733 Obstructive sleep apnea (adult) (pediatric): Secondary | ICD-10-CM

## 2020-05-12 DIAGNOSIS — I10 Essential (primary) hypertension: Secondary | ICD-10-CM

## 2020-05-12 DIAGNOSIS — I110 Hypertensive heart disease with heart failure: Secondary | ICD-10-CM | POA: Diagnosis not present

## 2020-05-12 DIAGNOSIS — Z5181 Encounter for therapeutic drug level monitoring: Secondary | ICD-10-CM | POA: Diagnosis not present

## 2020-05-12 DIAGNOSIS — E1129 Type 2 diabetes mellitus with other diabetic kidney complication: Secondary | ICD-10-CM

## 2020-05-12 DIAGNOSIS — J4540 Moderate persistent asthma, uncomplicated: Secondary | ICD-10-CM

## 2020-05-12 DIAGNOSIS — I272 Pulmonary hypertension, unspecified: Secondary | ICD-10-CM

## 2020-05-12 DIAGNOSIS — N1831 Chronic kidney disease, stage 3a: Secondary | ICD-10-CM | POA: Diagnosis not present

## 2020-05-12 DIAGNOSIS — R809 Proteinuria, unspecified: Secondary | ICD-10-CM | POA: Diagnosis not present

## 2020-05-12 DIAGNOSIS — I700 Atherosclerosis of aorta: Secondary | ICD-10-CM

## 2020-05-12 LAB — POCT GLYCOSYLATED HEMOGLOBIN (HGB A1C): Hemoglobin A1C: 6.9 % — AB (ref 4.0–5.6)

## 2020-05-12 MED ORDER — TRULICITY 1.5 MG/0.5ML ~~LOC~~ SOAJ: 1.5000 mg | SUBCUTANEOUS | 3 refills | Status: DC

## 2020-05-12 MED ORDER — INSULIN LISPRO PROT & LISPRO (75-25 MIX) 100 UNIT/ML KWIKPEN: 22.0000 [IU] | PEN_INJECTOR | Freq: Two times a day (BID) | SUBCUTANEOUS | 3 refills | Status: DC

## 2020-05-12 MED ORDER — APIXABAN 5 MG PO TABS: 5.0000 mg | ORAL_TABLET | Freq: Two times a day (BID) | ORAL | 3 refills | Status: DC

## 2020-05-12 MED ORDER — FLUTICASONE-SALMETEROL 250-50 MCG/DOSE IN AEPB: 1.0000 | INHALATION_SPRAY | Freq: Two times a day (BID) | RESPIRATORY_TRACT | 1 refills | Status: DC

## 2020-05-12 NOTE — Chronic Care Management (AMB) (Signed)
  Care Management   Note  05/12/2020 Name: Amber Reyes MRN: [id number] DOB: [birth year]  Amber Reyes is a 74 y.o. year old female who is a primary care patient of Steele Sizer, MD and is actively engaged with the care management team. I reached out to [name] by phone today to assist with re-scheduling a follow up visit with the Pharmacist.  Follow up plan: Telephone appointment with care management team member scheduled for:05/15/2020  [facility]

## 2020-05-13 LAB — CBC WITH DIFFERENTIAL/PLATELET
Absolute Monocytes: 559 cells/uL (ref 200–950)
Basophils Absolute: 26 cells/uL (ref 0–200)
Basophils Relative: 0.3 %
Eosinophils Absolute: 284 cells/uL (ref 15–500)
Eosinophils Relative: 3.3 %
HCT: 31.7 % — ABNORMAL LOW (ref 35.0–45.0)
Hemoglobin: 10.3 g/dL — ABNORMAL LOW (ref 11.7–15.5)
Lymphs Abs: 1290 cells/uL (ref 850–3900)
MCH: 28.2 pg (ref 27.0–33.0)
MCHC: 32.5 g/dL (ref 32.0–36.0)
MCV: 86.8 fL (ref 80.0–100.0)
MPV: 11.3 fL (ref 7.5–12.5)
Monocytes Relative: 6.5 %
Neutro Abs: 6441 cells/uL (ref 1500–7800)
Neutrophils Relative %: 74.9 %
Platelets: 229 10*3/uL (ref 140–400)
RBC: 3.65 10*6/uL — ABNORMAL LOW (ref 3.80–5.10)
RDW: 14.7 % (ref 11.0–15.0)
Total Lymphocyte: 15 %
WBC: 8.6 10*3/uL (ref 3.8–10.8)

## 2020-05-13 LAB — COMPLETE METABOLIC PANEL WITH GFR
AG Ratio: 1.2 (calc) (ref 1.0–2.5)
ALT: 8 U/L (ref 6–29)
AST: 15 U/L (ref 10–35)
Albumin: 3.7 g/dL (ref 3.6–5.1)
Alkaline phosphatase (APISO): 82 U/L (ref 37–153)
BUN/Creatinine Ratio: 22 (calc) (ref 6–22)
BUN: 28 mg/dL — ABNORMAL HIGH (ref 7–25)
CO2: 29 mmol/L (ref 20–32)
Calcium: 9 mg/dL (ref 8.6–10.4)
Chloride: 101 mmol/L (ref 98–110)
Creat: 1.29 mg/dL — ABNORMAL HIGH (ref 0.60–0.93)
GFR, Est African American: 48 mL/min/{1.73_m2} — ABNORMAL LOW (ref >=60)
GFR, Est Non African American: 41 mL/min/{1.73_m2} — ABNORMAL LOW (ref >=60)
Globulin: 3.1 g/dL (calc) (ref 1.9–3.7)
Glucose, Bld: 136 mg/dL — ABNORMAL HIGH (ref 65–99)
Potassium: 4.2 mmol/L (ref 3.5–5.3)
Sodium: 138 mmol/L (ref 135–146)
Total Bilirubin: 0.5 mg/dL (ref 0.2–1.2)
Total Protein: 6.8 g/dL (ref 6.1–8.1)

## 2020-05-13 LAB — LIPID PANEL
Cholesterol: 115 mg/dL (ref <200)
HDL: 47 mg/dL — ABNORMAL LOW (ref >=50)
LDL Cholesterol (Calc): 52 mg/dL (calc)
Non-HDL Cholesterol (Calc): 68 mg/dL (calc) (ref <130)
Total CHOL/HDL Ratio: 2.4 (calc) (ref <5.0)
Triglycerides: 82 mg/dL (ref <150)

## 2020-05-13 LAB — PARATHYROID HORMONE, INTACT (NO CA): PTH: 87 pg/mL — ABNORMAL HIGH (ref 14–64)

## 2020-05-13 LAB — VITAMIN D 25 HYDROXY (VIT D DEFICIENCY, FRACTURES): Vit D, 25-Hydroxy: 45 ng/mL (ref 30–100)

## 2020-05-14 ENCOUNTER — Other Ambulatory Visit
Admission: RE | Admit: 2020-05-14 | Discharge: 2020-05-14 | Disposition: A | Discharge status: Home / Self Care | Payer: Medicare PPO | Source: Ambulatory Visit | Attending: Gastroenterology | Admitting: Gastroenterology

## 2020-05-14 ENCOUNTER — Other Ambulatory Visit: Payer: Self-pay

## 2020-05-14 DIAGNOSIS — Z01812 Encounter for preprocedural laboratory examination: Secondary | ICD-10-CM | POA: Diagnosis not present

## 2020-05-14 DIAGNOSIS — Z20822 Contact with and (suspected) exposure to covid-19: Secondary | ICD-10-CM | POA: Diagnosis not present

## 2020-05-15 ENCOUNTER — Ambulatory Visit: Payer: Self-pay | Admitting: Pharmacist

## 2020-05-15 ENCOUNTER — Ambulatory Visit: Payer: Self-pay

## 2020-05-15 DIAGNOSIS — Z794 Long term (current) use of insulin: Secondary | ICD-10-CM

## 2020-05-15 DIAGNOSIS — E1122 Type 2 diabetes mellitus with diabetic chronic kidney disease: Secondary | ICD-10-CM

## 2020-05-15 DIAGNOSIS — J4541 Moderate persistent asthma with (acute) exacerbation: Secondary | ICD-10-CM

## 2020-05-15 LAB — SARS CORONAVIRUS 2 (TAT 6-24 HRS): SARS Coronavirus 2: NEGATIVE

## 2020-05-15 NOTE — Chronic Care Management (AMB) (Signed)
Chronic Care Management Pharmacy  Name: Amber Reyes  MRN: [id number] DOB: [birth year]  Chief Complaint/ HPI  [name],  74 y.o. , female presents for their Follow-Up CCM visit with the clinical pharmacist via telephone due to COVID-19 Pandemic.  PCP : Steele Sizer, MD  Their chronic conditions include: HTN, DM, OSA  Office Visits:NA  Consult Visit:NA  Medications: Outpatient Encounter Medications as of 05/15/2020  Medication Sig  . albuterol (PROVENTIL HFA;VENTOLIN HFA) 108 (90 BASE) MCG/ACT inhaler Inhale 2 puffs into the lungs every 6 (six) hours as needed for wheezing or shortness of breath.  . allopurinol (ZYLOPRIM) 100 MG tablet Take 1 tablet (100 mg total) by mouth 2 (two) times daily.  Marland Kitchen apixaban (ELIQUIS) 5 MG TABS tablet Take 1 tablet (5 mg total) by mouth 2 (two) times daily.  . Blood Glucose Calibration (TRUE METRIX LEVEL 1) Low SOLN 1 each by In Vitro route once a week.  . Blood Glucose Monitoring Suppl (TRUE METRIX AIR GLUCOSE METER) w/Device KIT Inject 1 each into the skin 4 (four) times daily as needed. Check fsbs 4 times daily E11.22  . Cholecalciferol (VITAMIN D) 2000 UNITS tablet Take 2,000 Units by mouth daily.  . colchicine (COLCRYS) 0.6 MG tablet Take 1-2 tablets (0.6-1.2 mg total) by mouth daily as needed. (Patient not taking: Reported on 02/12/2020)  . diltiazem (CARDIZEM CD) 300 MG 24 hr capsule Take 1 capsule (300 mg total) by mouth daily.  . DROPLET PEN NEEDLES 30G X 8 MM MISC USE TWICE DAILY WITH INSULIN  . Dulaglutide (TRULICITY) 1.5 YY/4.8GN SOPN Inject 1.5 mg into the skin once a week.  Regino Schultze Bandages & Supports (MEDICAL COMPRESSION STOCKINGS) MISC 2 each by Does not apply route daily.  . fluticasone (FLONASE) 50 MCG/ACT nasal spray USE 2 SPRAYS IN EACH NOSTRIL EVERY DAY  . Fluticasone-Salmeterol (ADVAIR DISKUS) 250-50 MCG/DOSE AEPB Inhale 1 puff into the lungs 2 (two) times daily.  . furosemide (LASIX) 40 MG tablet TAKE 1 TABLET  (40 MG TOTAL) BY MOUTH 2 (TWO) TIMES DAILY AS NEEDED.  Marland Kitchen Insulin Lispro Prot & Lispro (HUMALOG MIX 75/25 KWIKPEN) (75-25) 100 UNIT/ML Kwikpen Inject 22-50 Units into the skin 2 (two) times daily with a meal. 50 units before breakfast and 22 before dinner  . irbesartan (AVAPRO) 300 MG tablet Take 1 tablet (300 mg total) by mouth daily.  . pravastatin (PRAVACHOL) 80 MG tablet Take 1 tablet (80 mg total) by mouth every evening.   No facility-administered encounter medications on file as of 05/15/2020.     Financial Resource Strain: High Risk  . Difficulty of Paying Living Expenses: Hard     Current Diagnosis/Assessment:  Goals Addressed            This Visit's Progress   . Chronic Care Management       CARE PLAN ENTRY (see longitudinal plan of care for additional care plan information)  Current Barriers:  . Chronic Disease Management support, education, and care coordination needs related to Hypertension, Hyperlipidemia, Diabetes, and Asthma   Hypertension BP Readings from Last 3 Encounters:  02/06/20 135/63  11/15/19 140/62  10/08/19 130/60   . Pharmacist Clinical Goal(s): o Over the next 90 days, patient will work with PharmD and providers to maintain BP goal <130/80 . Current regimen:  o Cardizem CR 353m daily o Avapro 3086mdaily . Interventions: o Try Cardizem in the morning and Avapro at night . Patient self care activities - Over the next 90 days,  patient will: o Check BP weekly, document, and provide at future appointments o Ensure daily salt intake < 2300 mg/day o Call PharmD with any changes to level of fatigue o Occasional coughing fits improved by taking BP medication at bedtime  Hyperlipidemia Lab Results  Component Value Date/Time   LDLCALC 56 05/14/2019 12:00 AM   . Pharmacist Clinical Goal(s): o Over the next 90 days, patient will work with PharmD and providers to maintain LDL goal < 70 . Current regimen:  o Pravastatin 31m  daily . Interventions: o Take pravastatin at bedtime for maximum effectiveness . Patient self care activities - Over the next 90 days, patient will: o Take pravastatin at bedtime o Maintain positive lifestyle changes  Diabetes Lab Results  Component Value Date/Time   HGBA1C 6.7 (A) 10/08/2019 08:24 AM   HGBA1C 6.8 (H) 05/14/2019 12:00 AM   HGBA1C 6.5 01/01/2019 07:48 AM   HGBA1C 6.6 (A) 08/30/2018 07:46 AM   HGBA1C 6.6 08/30/2018 07:46 AM   HGBA1C 6.6 (A) 08/30/2018 07:46 AM   HGBA1C 6.6 08/30/2018 07:46 AM   . Pharmacist Clinical Goal(s): o Over the next 90 days, patient will work with PharmD and providers to maintain A1c goal <7% . Current regimen:  o Humalog 75/25 inject 50 units every morning and 20 units every evening o Trulicity inject 11.1HEweekly . Interventions: o None . Patient self care activities - Over the next 90 days, patient will: o Check blood sugar once daily, document, and provide at future appointments o Contact provider with any episodes of hypoglycemia  Asthma . Pharmacist Clinical Goal(s) o Over the next 90 days, patient will work with PharmD and providers to improve breathing . Current regimen:  o Advair 250/50 mcg 1 puff by mouth twice daily o Ventolin HFA 2 puffs by mouth as needed . Interventions: o [place] and Trelegy/Breztri not for asthma . Patient self care activities - Over the next 90 days, patient will: o Report to PharmD if rescue inhaler usage increases  Medication management . Pharmacist Clinical Goal(s): o Over the next 89 days, patient will work with PharmD and providers to maintain optimal medication adherence . Current pharmacy: [facility] . Interventions o Comprehensive medication review performed. o Continue current medication management strategy . Patient self care activities - Over the next 90 days, patient will: o Focus on medication adherence by timing medications for most comfort  and benefit o Take medications as prescribed o Report any questions or concerns to PharmD and/or provider(s)  Initial goal documentation       Hypertension   BP goal is:  <130/80  Office blood pressures are  BP Readings from Last 3 Encounters:  05/12/20 132/74  02/06/20 135/63  11/15/19 140/62   Patient checks BP at home infrequently Patient home BP readings are ranging: NA  Patient has failed these meds in the past: NA Patient is currently controlled on the following medications:  . Diltiazem 306mdaily . Irbesartan 3002maily  We discussed: Had coughing fits. Taking BP med at night helps.  Plan  Continue current medications   COPD / Asthma / Tobacco    Eosinophil count:   Lab Results  Component Value Date/Time   EOSPCT 3.3 05/12/2020 08:59 AM   EOSPCT 1.8 11/09/2013 11:37 AM   EOSPCT 4.2 03/16/2007 07:56 AM  %  Eos (Absolute):  Lab Results  Component Value Date/Time   EOSABS 284 05/12/2020 08:59 AM   EOSABS 0.3 04/16/2015 11:38 AM   EOSABS 0.2 11/09/2013 11:37 AM    Tobacco Status:  Social History   Tobacco Use  Smoking Status Never Smoker  Smokeless Tobacco Never Used  Tobacco Comment   n/a    Patient has failed these meds in past: NA Patient is currently controlled on the following medications: Advair Diskus 250/50mg Using maintenance inhaler regularly? Yes Frequency of rescue inhaler use:  infrequently  We discussed:   No progress on CPAP recall. Bad filter. Headaches, coughing, skin rashes, kidney issues.   Doesn't have a pulmonologist.  Plan  Recommend new CPAP Rx Continue current medications  Medication Management   We discussed:  Patient assistance applications still in progress. Got Trulicity and Humalog 732/95  Plan  Continue current medication management strategy  Follow up: 3 month phone visit  TMilus Height PharmD, [phone]

## 2020-05-16 ENCOUNTER — Ambulatory Visit: Payer: Medicare PPO | Admitting: Certified Registered Nurse Anesthetist

## 2020-05-16 ENCOUNTER — Encounter
Admission: RE | Disposition: A | Discharge status: Home / Self Care | Payer: Self-pay | Source: Home / Self Care | Attending: Gastroenterology

## 2020-05-16 ENCOUNTER — Ambulatory Visit
Admission: RE | Admit: 2020-05-16 | Discharge: 2020-05-16 | Disposition: A | Discharge status: Home / Self Care | Payer: Medicare PPO | Source: Home / Self Care | Attending: Gastroenterology | Admitting: Gastroenterology

## 2020-05-16 DIAGNOSIS — Z86010 Personal history of colonic polyps: Secondary | ICD-10-CM

## 2020-05-16 SURGERY — COLONOSCOPY WITH PROPOFOL
Anesthesia: General

## 2020-05-16 MED ORDER — SODIUM CHLORIDE 0.9 % IV SOLN: INTRAVENOUS | Status: DC

## 2020-05-16 NOTE — Progress Notes (Signed)
Patient did not stop blood thinner

## 2020-05-18 NOTE — Patient Instructions (Signed)
Visit Information  Goals Addressed            This Visit's Progress   . Chronic Care Management       CARE PLAN ENTRY (see longitudinal plan of care for additional care plan information)  Current Barriers:  . Chronic Disease Management support, education, and care coordination needs related to Hypertension, Hyperlipidemia, Diabetes, and Asthma   Hypertension BP Readings from Last 3 Encounters:  02/06/20 135/63  11/15/19 140/62  10/08/19 130/60   . Pharmacist Clinical Goal(s): o Over the next 90 days, patient will work with PharmD and providers to maintain BP goal <130/80 . Current regimen:  o Cardizem CR 300mg  daily o Avapro 300mg  daily . Interventions: o Try Cardizem in the morning and Avapro at night . Patient self care activities - Over the next 90 days, patient will: o Check BP weekly, document, and provide at future appointments o Ensure daily salt intake < 2300 mg/day o Call PharmD with any changes to level of fatigue o Occasional coughing fits improved by taking BP medication at bedtime  Hyperlipidemia Lab Results  Component Value Date/Time   LDLCALC 56 05/14/2019 12:00 AM   . Pharmacist Clinical Goal(s): o Over the next 90 days, patient will work with PharmD and providers to maintain LDL goal < 70 . Current regimen:  o Pravastatin 80mg  daily . Interventions: o Take pravastatin at bedtime for maximum effectiveness . Patient self care activities - Over the next 90 days, patient will: o Take pravastatin at bedtime o Maintain positive lifestyle changes  Diabetes Lab Results  Component Value Date/Time   HGBA1C 6.7 (A) 10/08/2019 08:24 AM   HGBA1C 6.8 (H) 05/14/2019 12:00 AM   HGBA1C 6.5 01/01/2019 07:48 AM   HGBA1C 6.6 (A) 08/30/2018 07:46 AM   HGBA1C 6.6 08/30/2018 07:46 AM   HGBA1C 6.6 (A) 08/30/2018 07:46 AM   HGBA1C 6.6 08/30/2018 07:46 AM   . Pharmacist Clinical Goal(s): o Over the next 90 days, patient will work with PharmD and providers to  maintain A1c goal <7% . Current regimen:  o Humalog 75/25 inject 50 units every morning and 20 units every evening o Trulicity inject 1.5mg  weekly . Interventions: o None . Patient self care activities - Over the next 90 days, patient will: o Check blood sugar once daily, document, and provide at future appointments o Contact provider with any episodes of hypoglycemia  Asthma . Pharmacist Clinical Goal(s) o Over the next 90 days, patient will work with PharmD and providers to improve breathing . Current regimen:  o Advair 250/50 mcg 1 puff by mouth twice daily o Ventolin HFA 2 puffs by mouth as needed . Interventions: o Counseled on [place] on patient assistance and Trelegy/Breztri not for asthma . Patient self care activities - Over the next 90 days, patient will: o Report to PharmD if rescue inhaler usage increases  Medication management . Pharmacist Clinical Goal(s): o Over the next 89 days, patient will work with PharmD and providers to maintain optimal medication adherence . Current pharmacy: [facility] . Interventions o Comprehensive medication review performed. o Continue current medication management strategy . Patient self care activities - Over the next 90 days, patient will: o Focus on medication adherence by timing medications for most comfort and benefit o Take medications as prescribed o Report any questions or concerns to PharmD and/or provider(s)  Initial goal documentation        Print copy of patient instructions provided.   Telephone follow up appointment with  pharmacy team member scheduled for: 3 months  Milus Height, PharmD, [place], [facility] [phone]

## 2020-05-21 ENCOUNTER — Other Ambulatory Visit: Payer: Self-pay

## 2020-05-21 ENCOUNTER — Telehealth: Payer: Self-pay

## 2020-05-21 ENCOUNTER — Telehealth: Payer: Self-pay | Admitting: Gastroenterology

## 2020-05-21 DIAGNOSIS — Z86010 Personal history of colonic polyps: Secondary | ICD-10-CM

## 2020-05-21 DIAGNOSIS — G4733 Obstructive sleep apnea (adult) (pediatric): Secondary | ICD-10-CM

## 2020-05-21 NOTE — Progress Notes (Signed)
Patient procedure has been reschedule and instructions have been sent via my chart and mail.

## 2020-05-21 NOTE — Progress Notes (Signed)
Patient is just under the needed out of pocket expense for Eliquis and Advair. She needs more Advair which will put her over the out of pocket requirement for patient assistance. She will provide me with an updated out of pocket prescription expense report from pharmacy once she makes her order.  Patient received her replacement CPAP machine yesterday.   Humalog 37/62 and Trulicity shipments from [facility] are being received as expected.

## 2020-05-21 NOTE — Progress Notes (Signed)
Per Dr. Vicente Males he would like for patient to stop the Eliquis more than 1 day before procedure.

## 2020-05-21 NOTE — Telephone Encounter (Signed)
Patient calling to resch her colonoscopy with Dr. Anna from 9.24.21. Pt had not stopped a medication so they could not do procedure. Pt already received new prep kit, just needs to resch. Pt had screening with nurse. Please call pt back to resch. 

## 2020-05-22 NOTE — Progress Notes (Signed)
Hold Eliquis 2 days before the procedure.

## 2020-05-23 DIAGNOSIS — G4733 Obstructive sleep apnea (adult) (pediatric): Secondary | ICD-10-CM | POA: Diagnosis not present

## 2020-05-27 ENCOUNTER — Telehealth: Payer: Self-pay

## 2020-05-27 NOTE — Telephone Encounter (Signed)
Copied from [place] [id number]. Topic: General - Other >> May 27, 2020  1:49 PM Mcneil, Ja-Kwan wrote: Reason for CRM: Pt stated she received hr new cpap machine and she would like to know if she needs to keep the appt with pulmonary. Pt requests call back. Cb# [phone]

## 2020-05-28 NOTE — Telephone Encounter (Signed)
Patient called.  Patient aware.  

## 2020-05-29 ENCOUNTER — Ambulatory Visit: Payer: Medicare PPO | Admitting: Pulmonary Disease

## 2020-05-30 ENCOUNTER — Other Ambulatory Visit: Payer: Self-pay | Admitting: Family Medicine

## 2020-06-13 ENCOUNTER — Telehealth: Payer: Self-pay | Admitting: Family Medicine

## 2020-06-13 NOTE — Telephone Encounter (Signed)
Pt called stating that her insurance is requiring her to have a PA for her Advair inhaler.Pt states that she is out of this medication. Please advise.       [place], [place]  [place] [address]  Phone: [phone] Fax: [phone]  Hours: Not open 24 hours

## 2020-06-17 NOTE — Telephone Encounter (Signed)
Patient checking on the status of PA and would like a follow up call today stating she's completely out.

## 2020-06-19 ENCOUNTER — Ambulatory Visit (INDEPENDENT_AMBULATORY_CARE_PROVIDER_SITE_OTHER): Payer: Medicare PPO

## 2020-06-19 DIAGNOSIS — Z0000 Encounter for general adult medical examination without abnormal findings: Secondary | ICD-10-CM | POA: Diagnosis not present

## 2020-06-19 NOTE — Progress Notes (Signed)
Subjective:   Amber Reyes is a 74 y.o. female who presents for Medicare Annual (Subsequent) preventive examination.  Virtual Visit via Telephone Note  I connected with  Amber Reyes on 06/19/20 at  8:00 AM EDT by telephone and verified that I am speaking with the correct person using two identifiers.  Medicare Annual Wellness visit completed telephonically due to Covid-19 pandemic.   Location: Patient: home Provider: [place]   I discussed the limitations, risks, security and privacy concerns of performing an evaluation and management service by telephone and the availability of in person appointments. The patient expressed understanding and agreed to proceed.  Unable to perform video visit due to video visit attempted and failed and/or patient does not have video capability.   Some vital signs may be absent or patient reported.   Amber Marker, LPN    Review of Systems     Cardiac Risk Factors include: advanced age (>14mn, >>10women);diabetes mellitus;dyslipidemia;hypertension;obesity (BMI >30kg/m2);sedentary lifestyle     Objective:    There were no vitals filed for this visit. There is no height or weight on file to calculate BMI.  Advanced Directives 06/19/2020 06/15/2019 04/13/2019 09/25/2018 03/25/2017 12/30/2016 12/23/2016  Does Patient Have a Medical Advance Directive? No No No No No No No  Would patient like information on creating a medical advance directive? Yes (MAU/Ambulatory/Procedural Areas - Information given) Yes (MAU/Ambulatory/Procedural Areas - Information given) No - Patient declined - - - -    Current Medications (verified) Outpatient Encounter Medications as of 06/19/2020  Medication Sig  . albuterol (PROVENTIL HFA;VENTOLIN HFA) 108 (90 BASE) MCG/ACT inhaler Inhale 2 puffs into the lungs every 6 (six) hours as needed for wheezing or shortness of breath.  . allopurinol (ZYLOPRIM) 100 MG tablet Take 1 tablet (100 mg total) by mouth 2 (two) times  daily.  .Marland Kitchenapixaban (ELIQUIS) 5 MG TABS tablet Take 1 tablet (5 mg total) by mouth 2 (two) times daily.  . Blood Glucose Calibration (TRUE METRIX LEVEL 1) Low SOLN 1 each by In Vitro route once a week.  . Blood Glucose Monitoring Suppl (TRUE METRIX AIR GLUCOSE METER) w/Device KIT Inject 1 each into the skin 4 (four) times daily as needed. Check fsbs 4 times daily E11.22  . Cholecalciferol (VITAMIN D) 2000 UNITS tablet Take 2,000 Units by mouth daily.  .Marland Kitchendiltiazem (CARDIZEM CD) 300 MG 24 hr capsule Take 1 capsule (300 mg total) by mouth daily.  . DROPLET PEN NEEDLES 30G X 8 MM MISC USE TWICE DAILY WITH INSULIN  . Dulaglutide (TRULICITY) 1.5 MZP/9.1TASOPN Inject 1.5 mg into the skin once a week.  .Amber SchultzeBandages & Supports (MEDICAL COMPRESSION STOCKINGS) MISC 2 each by Does not apply route daily.  . fluticasone (FLONASE) 50 MCG/ACT nasal spray USE 2 SPRAYS IN EACH NOSTRIL EVERY DAY  . furosemide (LASIX) 40 MG tablet TAKE 1 TABLET (40 MG TOTAL) BY MOUTH 2 (TWO) TIMES DAILY AS NEEDED.  .Marland KitchenInsulin Lispro Prot & Lispro (HUMALOG MIX 75/25 KWIKPEN) (75-25) 100 UNIT/ML Kwikpen Inject 22-50 Units into the skin 2 (two) times daily with a meal. 50 units before breakfast and 22 before dinner  . irbesartan (AVAPRO) 300 MG tablet Take 1 tablet (300 mg total) by mouth daily.  . pravastatin (PRAVACHOL) 80 MG tablet Take 1 tablet (80 mg total) by mouth every evening.  . TRUE METRIX BLOOD GLUCOSE TEST test strip CHECK BLOOD SUGAR FOUR TIMES DAILY  . colchicine (COLCRYS) 0.6 MG tablet Take 1-2 tablets (0.6-1.2  mg total) by mouth daily as needed. (Patient not taking: Reported on 02/12/2020)  . Fluticasone-Salmeterol (ADVAIR DISKUS) 250-50 MCG/DOSE AEPB Inhale 1 puff into the lungs 2 (two) times daily. (Patient not taking: Reported on 06/19/2020)   No facility-administered encounter medications on file as of 06/19/2020.    Allergies (verified) Patient has no known allergies.   History: Past Medical History:    Diagnosis Date  . Allergic rhinitis, cause unspecified   . Anxiety   . Chronic diastolic CHF (congestive heart failure) ([place])   . CKD (chronic kidney disease), stage III ([place])   . Edema   . Essential hypertension, benign   . Gout, unspecified   . Heart murmur    as child  . Hyperlipidemia   . Lipoma of unspecified site   . Other and unspecified disc disorder of lumbar region   . Other general symptoms(780.99)   . PAC (premature atrial contraction)   . Renal insufficiency   . Restless legs syndrome (RLS)   . Sebaceous cyst   . Super obesity   . Type II or unspecified type diabetes mellitus without mention of complication, uncontrolled   . Unspecified asthma(493.90)   . Unspecified disease of pericardium   . Unspecified sleep apnea   . Unspecified vitamin D deficiency   . Vaginitis and vulvovaginitis, unspecified   . Venous insufficiency    Past Surgical History:  Procedure Laterality Date  . CARDIAC CATHETERIZATION  2002   [facility]  . COLONOSCOPY WITH PROPOFOL N/A 09/25/2018   Procedure: COLONOSCOPY WITH PROPOFOL;  Surgeon: Jonathon Bellows, MD;  [facility] ENDOSCOPY;  Service: Gastroenterology;  Laterality: N/A;  . COLONOSCOPY WITH PROPOFOL N/A 04/13/2019   Procedure: COLONOSCOPY WITH PROPOFOL;  Surgeon: Jonathon Bellows, MD;  Location: [facility] ENDOSCOPY;  Service: Gastroenterology;  Laterality: N/A;  . PERICARDIUM SURGERY     Family History  Problem Relation Age of Onset  . Heart attack Mother   . Hypertension Mother   . Breast cancer Neg Hx    Social History   Socioeconomic History  . Marital status: Married    Spouse name: Not on file  . Number of children: 3  . Years of education: Not on file  . Highest education level: Associate degree: academic program  Occupational History  . Occupation: retired  Tobacco Use  . Smoking status: Never Smoker  . Smokeless tobacco: Never Used  . Tobacco comment: n/a  Vaping Use  . Vaping Use: Never used  Substance and Sexual Activity   . Alcohol use: No    Alcohol/week: 0.0 standard drinks  . Drug use: No  . Sexual activity: Not Currently  Other Topics Concern  . Not on file  Social History Narrative  . Not on file   Social Determinants of Health   Financial Resource Strain: High Risk  . Difficulty of Paying Living Expenses: Hard  Food Insecurity: No Food Insecurity  . Worried About Charity fundraiser in the Last Year: Never true  . Ran Out of Food in the Last Year: Never true  Transportation Needs: No Transportation Needs  . Lack of Transportation (Medical): No  . Lack of Transportation (Non-Medical): No  Physical Activity: Inactive  . Days of Exercise per Week: 0 days  . Minutes of Exercise per Session: 0 min  Stress: No Stress Concern Present  . Feeling of Stress : Only a little  Social Connections: Socially Integrated  . Frequency of Communication with Friends and Family: More than three times a week  .  Frequency of Social Gatherings with Friends and Family: Once a week  . Attends Religious Services: More than 4 times per year  . Active Member of Clubs or Organizations: Yes  . Attends Archivist Meetings: More than 4 times per year  . Marital Status: Married    Tobacco Counseling Counseling given: Not Answered Comment: n/a   Clinical Intake:  Pre-visit preparation completed: Yes  Pain : No/denies pain     Nutritional Status: BMI > 30  Obese Nutritional Risks: None Diabetes: Yes CBG done?: No Did pt. bring in CBG monitor from home?: No  How often do you need to have someone help you when you read instructions, pamphlets, or other written materials from your doctor or pharmacy?: 1 - Never  Nutrition Risk Assessment:  Has the patient had any N/V/D within the last 2 months?  No  Does the patient have any non-healing wounds?  No  Has the patient had any unintentional weight loss or weight gain?  No   Diabetes:  Is the patient diabetic?  Yes  If diabetic, was a CBG obtained  today?  No  Did the patient bring in their glucometer from home?  No  How often do you monitor your CBG's? daily.   Financial Strains and Diabetes Management:  Are you having any financial strains with the device, your supplies or your medication? No .  Does the patient want to be seen by Chronic Care Management for management of their diabetes?  Yes  - already enrolled Would the patient like to be referred to a Nutritionist or for Diabetic Management?  No   Diabetic Exams:  Diabetic Eye Exam: Completed 09/02/17 negative retinopathy. Appt scheduled for 08/18/20.  Diabetic Foot Exam: Completed 10/08/19.  Interpreter Needed?: No  Information entered by :: Amber Marker LPN   Activities of Daily Living In your present state of health, do you have any difficulty performing the following activities: 06/19/2020 05/12/2020  Hearing? N N  Comment declines hearing aids -  Vision? N N  Difficulty concentrating or making decisions? N N  Walking or climbing stairs? N Y  Dressing or bathing? N N  Doing errands, shopping? N N  Preparing Food and eating ? N -  Using the Toilet? N -  In the past six months, have you accidently leaked urine? N -  Do you have problems with loss of bowel control? N -  Managing your Medications? N -  Managing your Finances? N -  Housekeeping or managing your Housekeeping? N -  Some recent data might be hidden    Patient Care Team: Steele Sizer, MD as PCP - General (Family Medicine) Wellington Hampshire, MD as PCP - Cardiology (Cardiology) Wellington Hampshire, MD as Consulting Physician (Cardiology) Mottl, Andrez Grime, MD as Referring Physician (Nephrology) Paulla Dolly Tamala Fothergill, DPM as Consulting Physician (Podiatry) Verdia Kuba, [facility] (Pharmacist)  Indicate any recent Medical Services you may have received from other than [facility] providers in the past year (date may be approximate).     Assessment:   This is a routine wellness examination for [place].  Hearing/Vision  screen  Hearing Screening   '125Hz'  '250Hz'  '500Hz'  '1000Hz'  '2000Hz'  '3000Hz'  '4000Hz'  '6000Hz'  '8000Hz'   Right ear:           Left ear:           Comments: Pt denies hearing difficulty  Vision Screening Comments: [facility] screen[facility] Dr[facility] Michelene Heady   Dietary issues and exercise activities  discussed: Current Exercise Habits: The patient does not participate in regular exercise at present, Exercise limited by: orthopedic condition(s)  Goals    .  Chronic Care Management      CARE PLAN ENTRY (see longitudinal plan of care for additional care plan information)  Current Barriers:  . Chronic Disease Management support, education, and care coordination needs related to Hypertension, Hyperlipidemia, Diabetes, and Asthma   Hypertension BP Readings from Last 3 Encounters:  02/06/20 135/63  11/15/19 140/62  10/08/19 130/60   . Pharmacist Clinical Goal(s): o Over the next 90 days, patient will work with PharmD and providers to maintain BP goal <130/80 . Current regimen:  o Cardizem CR 331m daily o Avapro 3069mdaily . Interventions: o Try Cardizem in the morning and Avapro at night . Patient self care activities - Over the next 90 days, patient will: o Check BP weekly, document, and provide at future appointments o Ensure daily salt intake < 2300 mg/day o Call PharmD with any changes to level of fatigue o Occasional coughing fits improved by taking BP medication at bedtime  Hyperlipidemia Lab Results  Component Value Date/Time   LDLCALC 56 05/14/2019 12:00 AM   . Pharmacist Clinical Goal(s): o Over the next 90 days, patient will work with PharmD and providers to maintain LDL goal < 70 . Current regimen:  o Pravastatin 8065maily . Interventions: o Take pravastatin at bedtime for maximum effectiveness . Patient self care activities - Over the next 90 days, patient will: o Take pravastatin at bedtime o Maintain positive lifestyle changes  Diabetes Lab Results    Component Value Date/Time   HGBA1C 6.7 (A) 10/08/2019 08:24 AM   HGBA1C 6.8 (H) 05/14/2019 12:00 AM   HGBA1C 6.5 01/01/2019 07:48 AM   HGBA1C 6.6 (A) 08/30/2018 07:46 AM   HGBA1C 6.6 08/30/2018 07:46 AM   HGBA1C 6.6 (A) 08/30/2018 07:46 AM   HGBA1C 6.6 08/30/2018 07:46 AM   . Pharmacist Clinical Goal(s): o Over the next 90 days, patient will work with PharmD and providers to maintain A1c goal <7% . Current regimen:  o Humalog 75/25 inject 50 units every morning and 20 units every evening o Trulicity inject 1.55.4YHekly . Interventions: o None . Patient self care activities - Over the next 90 days, patient will: o Check blood sugar once daily, document, and provide at future appointments o Contact provider with any episodes of hypoglycemia  Asthma . Pharmacist Clinical Goal(s) o Over the next 90 days, patient will work with PharmD and providers to improve breathing . Current regimen:  o Advair 250/50 mcg 1 puff by mouth twice daily o Ventolin HFA 2 puffs by mouth as needed . Interventions: o Counseled on [employer] patient assistance and Trelegy/Breztri not for asthma . Patient self care activities - Over the next 90 days, patient will: o Report to PharmD if rescue inhaler usage increases  Medication management . Pharmacist Clinical Goal(s): o Over the next 89 days, patient will work with PharmD and providers to maintain optimal medication adherence . Current pharmacy: [facility] . Interventions o Comprehensive medication review performed. o Continue current medication management strategy . Patient self care activities - Over the next 90 days, patient will: o Focus on medication adherence by timing medications for most comfort and benefit o Take medications as prescribed o Report any questions or concerns to PharmD and/or provider(s)  Initial goal documentation     .  Medication Assistance 2021 (pt-stated)      Current  Barriers:  . financial  Pharmacist  Clinical Goal(s): Over the next 14 days, [name] will provide the necessary supplementary documents (proof of out of pocket prescription expenditure, proof of household income) needed for medication assistance applications to CCM pharmacist.   Interventions: . CCM pharmacist will apply for medication assistance program for Humalog 26/33, Trulicity made by [employer]. . Assessed medication list for other programs: o [place] gout program (allopurinol, colchicine) o Advair (has not met $600 OOP) o Eliquis (has not met 3% OOP)  . Updated 1/12: received patient portion of application, Dr. Ancil Boozer to complete her portion of application for Trulicity and Humulin mix  . Updated 3/54: Trulicity and Humulin approvals   Patient Self Care Activities:  Marland Kitchen Gather necessary documents needed to apply for medication assistance  Please see past updates related to this goal by clicking on the "Past Updates" button in the selected goal        Depression Screen PHQ 2/9 Scores 06/19/2020 05/12/2020 02/06/2020 10/08/2019 06/15/2019 05/08/2019 01/01/2019  PHQ - 2 Score 0 0 0 0 0 0 0  PHQ- 9 Score - 2 0 0 - 0 0    Fall Risk Fall Risk  06/19/2020 05/12/2020 02/06/2020 10/08/2019 06/15/2019  Falls in the past year? 0 0 0 0 0  Number falls in past yr: 0 0 0 0 0  Injury with Fall? 0 0 0 0 0  Risk for fall due to : No Fall Risks - - - -  Follow up Falls prevention discussed - Falls evaluation completed - Falls prevention discussed    Any stairs in or around the home? Yes  If so, are there any without handrails? No  Home free of loose throw rugs in walkways, pet beds, electrical cords, etc? Yes  Adequate lighting in your home to reduce risk of falls? Yes   ASSISTIVE DEVICES UTILIZED TO PREVENT FALLS:  Life alert? No  Use of a cane, walker or w/c? Yes  Grab bars in the bathroom? Yes  Shower chair or bench in shower? Yes Elevated toilet seat or a handicapped toilet? Yes   TIMED UP AND GO:  Was the  test performed? No . Telephonic visit  Cognitive Function: 6CIT deferred for 2021 AWV; pt states no memory issues.      6CIT Screen 06/15/2019  What Year? 0 points  What month? 0 points  What time? 0 points  Count back from 20 0 points  Months in reverse 0 points  Repeat phrase 4 points  Total Score 4    Immunizations Immunization History  Administered Date(s) Administered  . Fluad Quad(high Dose 65+) 05/14/2019, 05/12/2020  . Influenza Split 05/16/2007  . Influenza, High Dose Seasonal PF 06/24/2017, 04/25/2018  . Influenza, Seasonal, Injecte, Preservative Fre 05/07/2010, 04/27/2011  . Influenza,inj,Quad PF,6+ Mos 08/02/2014, 04/15/2015, 05/22/2016  . Influenza-Unspecified 08/02/2014  . Moderna SARS-COVID-2 Vaccination 09/28/2019, 10/26/2019  . Pneumococcal Conjugate-13 08/02/2014  . Pneumococcal Polysaccharide-23 04/27/2011, 06/04/2016  . Tdap 09/29/2012  . Zoster 09/29/2012    TDAP status: Up to date   Flu Vaccine status: Up to date   Pneumococcal vaccine status: Up to date   Covid-19 vaccine status: Completed vaccines  Qualifies for Shingles Vaccine? Yes   Zostavax completed Yes   Shingrix Completed?: No.    Education has been provided regarding the importance of this vaccine. Patient has been advised to call insurance company to determine out of pocket expense if they have not yet received this vaccine. Advised may also receive vaccine  at local pharmacy or Health Dept. Verbalized acceptance and understanding.  Screening Tests Health Maintenance  Topic Date Due  . OPHTHALMOLOGY EXAM  09/02/2018  . COLONOSCOPY  04/12/2020  . FOOT EXAM  10/07/2020  . HEMOGLOBIN A1C  11/09/2020  . MAMMOGRAM  11/15/2020  . TETANUS/TDAP  09/29/2022  . INFLUENZA VACCINE  Completed  . DEXA SCAN  Completed  . COVID-19 Vaccine  Completed  . Hepatitis C Screening  Completed  . PNA vac Low Risk Adult  Completed    Health Maintenance  Health Maintenance Due  Topic Date Due  .  OPHTHALMOLOGY EXAM  09/02/2018  . COLONOSCOPY  04/12/2020    Colorectal cancer screening: Completed 04/13/19. Repeat every 1 years. Pt scheduled for repeat screening colonoscopy on 06/27/20.  Mammogram status: Completed 11/16/19. Repeat every year    Bone density screening: not eligible due to over weight limit  Lung Cancer Screening: (Low Dose CT Chest recommended if Age 37-80 years, 30 pack-year currently smoking OR have quit w/in 15years.) does not qualify.   Additional Screening:  Hepatitis C Screening: does qualify; Completed 04/02/13  Vision Screening: Recommended annual ophthalmology exams for early detection of glaucoma and other disorders of the eye. Is the patient up to date with their annual eye exam?  No  - scheduled for 08/18/20 Who is the provider or what is the name of the office in which the patient attends annual eye exams? [facility] Dr. George Ina  Dental Screening: Recommended annual dental exams for proper oral hygiene  Community Resource Referral / Chronic Care Management: CRR required this visit?  No   CCM required this visit?  No      Plan:     I have personally reviewed and noted the following in the patient's chart:   . Medical and social history . Use of alcohol, tobacco or illicit drugs  . Current medications and supplements . Functional ability and status . Nutritional status . Physical activity . Advanced directives . List of other physicians . Hospitalizations, surgeries, and ER visits in previous 12 months . Vitals . Screenings to include cognitive, depression, and falls . Referrals and appointments  In addition, I have reviewed and discussed with patient certain preventive protocols, quality metrics, and best practice recommendations. A written personalized care plan for preventive services as well as general preventive health recommendations were provided to patient.     Amber Marker, LPN   [id number]   Nurse Notes: pt states  she is awaiting prior authorization for advair. CMA notified for follow up as patient has been out of medication for the last 5 days.

## 2020-06-19 NOTE — Patient Instructions (Signed)
Amber Reyes , Thank you for taking time to come for your Medicare Wellness Visit. I appreciate your ongoing commitment to your health goals. Please review the following plan we discussed and let me know if I can assist you in the future.   Screening recommendations/referrals: Colonoscopy: done 04/13/19. Scheduled for 06/27/20 Mammogram: done 11/16/19 Bone Density: n/a Recommended yearly ophthalmology/optometry visit for glaucoma screening and checkup Recommended yearly dental visit for hygiene and checkup  Vaccinations: Influenza vaccine: done 05/12/20 Pneumococcal vaccine: done 06/04/16 Tdap vaccine: done 09/29/12 Shingles vaccine: Shingrix discussed. Please contact your pharmacy for coverage information.  Covid-19: done 09/28/19 & 10/26/19  Advanced directives: Advance directive discussed with you today. I have provided a copy for you to complete at home and have notarized. Once this is complete please bring a copy in to our office so we can scan it into your chart.  Conditions/risks identified: Recommend increasing physical activity as tolerated  Next appointment: Follow up in one year for your annual wellness visit    Preventive Care 65 Years and Older, Female Preventive care refers to lifestyle choices and visits with your health care provider that can promote health and wellness. What does preventive care include?  A yearly physical exam. This is also called an annual well check.  Dental exams once or twice a year.  Routine eye exams. Ask your health care provider how often you should have your eyes checked.  Personal lifestyle choices, including:  Daily care of your teeth and gums.  Regular physical activity.  Eating a healthy diet.  Avoiding tobacco and drug use.  Limiting alcohol use.  Practicing safe sex.  Taking low-dose aspirin every day.  Taking vitamin and mineral supplements as recommended by your health care provider. What happens during an annual well  check? The services and screenings done by your health care provider during your annual well check will depend on your age, overall health, lifestyle risk factors, and family history of disease. Counseling  Your health care provider may ask you questions about your:  Alcohol use.  Tobacco use.  Drug use.  Emotional well-being.  Home and relationship well-being.  Sexual activity.  Eating habits.  History of falls.  Memory and ability to understand (cognition).  Work and work Statistician.  Reproductive health. Screening  You may have the following tests or measurements:  Height, weight, and BMI.  Blood pressure.  Lipid and cholesterol levels. These may be checked every 5 years, or more frequently if you are over 16 years old.  Skin check.  Lung cancer screening. You may have this screening every year starting at age 27 if you have a 30-pack-year history of smoking and currently smoke or have quit within the past 15 years.  Fecal occult blood test (FOBT) of the stool. You may have this test every year starting at age 89.  Flexible sigmoidoscopy or colonoscopy. You may have a sigmoidoscopy every 5 years or a colonoscopy every 10 years starting at age 33.  Hepatitis C blood test.  Hepatitis B blood test.  Sexually transmitted disease (STD) testing.  Diabetes screening. This is done by checking your blood sugar (glucose) after you have not eaten for a while (fasting). You may have this done every 1-3 years.  Bone density scan. This is done to screen for osteoporosis. You may have this done starting at age 63.  Mammogram. This may be done every 1-2 years. Talk to your health care provider about how often you should have regular mammograms. Talk  with your health care provider about your test results, treatment options, and if necessary, the need for more tests. Vaccines  Your health care provider may recommend certain vaccines, such as:  Influenza vaccine. This is  recommended every year.  Tetanus, diphtheria, and acellular pertussis (Tdap, Td) vaccine. You may need a Td booster every 10 years.  Zoster vaccine. You may need this after age 75.  Pneumococcal 13-valent conjugate (PCV13) vaccine. One dose is recommended after age 15.  Pneumococcal polysaccharide (PPSV23) vaccine. One dose is recommended after age 57. Talk to your health care provider about which screenings and vaccines you need and how often you need them. This information is not intended to replace advice given to you by your health care provider. Make sure you discuss any questions you have with your health care provider. Document Released: 09/05/2015 Document Revised: 04/28/2016 Document Reviewed: 06/10/2015 Elsevier Interactive Patient Education  2017 [place] Prevention in the Home Falls can cause injuries. They can happen to people of all ages. There are many things you can do to make your home safe and to help prevent falls. What can I do on the outside of my home?  Regularly fix the edges of walkways and driveways and fix any cracks.  Remove anything that might make you trip as you walk through a door, such as a raised step or threshold.  Trim any bushes or trees on the path to your home.  Use bright outdoor lighting.  Clear any walking paths of anything that might make someone trip, such as rocks or tools.  Regularly check to see if handrails are loose or broken. Make sure that both sides of any steps have handrails.  Any raised decks and porches should have guardrails on the edges.  Have any leaves, snow, or ice cleared regularly.  Use sand or salt on walking paths during winter.  Clean up any spills in your garage right away. This includes oil or grease spills. What can I do in the bathroom?  Use night lights.  Install grab bars by the toilet and in the tub and shower. Do not use towel bars as grab bars.  Use non-skid mats or decals in the tub or  shower.  If you need to sit down in the shower, use a plastic, non-slip stool.  Keep the floor dry. Clean up any water that spills on the floor as soon as it happens.  Remove soap buildup in the tub or shower regularly.  Attach bath mats securely with double-sided non-slip rug tape.  Do not have throw rugs and other things on the floor that can make you trip. What can I do in the bedroom?  Use night lights.  Make sure that you have a light by your bed that is easy to reach.  Do not use any sheets or blankets that are too big for your bed. They should not hang down onto the floor.  Have a firm chair that has side arms. You can use this for support while you get dressed.  Do not have throw rugs and other things on the floor that can make you trip. What can I do in the kitchen?  Clean up any spills right away.  Avoid walking on wet floors.  Keep items that you use a lot in easy-to-reach places.  If you need to reach something above you, use a strong step stool that has a grab bar.  Keep electrical cords out of the way.  Do  not use floor polish or wax that makes floors slippery. If you must use wax, use non-skid floor wax.  Do not have throw rugs and other things on the floor that can make you trip. What can I do with my stairs?  Do not leave any items on the stairs.  Make sure that there are handrails on both sides of the stairs and use them. Fix handrails that are broken or loose. Make sure that handrails are as long as the stairways.  Check any carpeting to make sure that it is firmly attached to the stairs. Fix any carpet that is loose or worn.  Avoid having throw rugs at the top or bottom of the stairs. If you do have throw rugs, attach them to the floor with carpet tape.  Make sure that you have a light switch at the top of the stairs and the bottom of the stairs. If you do not have them, ask someone to add them for you. What else can I do to help prevent  falls?  Wear shoes that:  Do not have high heels.  Have rubber bottoms.  Are comfortable and fit you well.  Are closed at the toe. Do not wear sandals.  If you use a stepladder:  Make sure that it is fully opened. Do not climb a closed stepladder.  Make sure that both sides of the stepladder are locked into place.  Ask someone to hold it for you, if possible.  Clearly mark and make sure that you can see:  Any grab bars or handrails.  First and last steps.  Where the edge of each step is.  Use tools that help you move around (mobility aids) if they are needed. These include:  Canes.  Walkers.  Scooters.  Crutches.  Turn on the lights when you go into a dark area. Replace any light bulbs as soon as they burn out.  Set up your furniture so you have a clear path. Avoid moving your furniture around.  If any of your floors are uneven, fix them.  If there are any pets around you, be aware of where they are.  Review your medicines with your doctor. Some medicines can make you feel dizzy. This can increase your chance of falling. Ask your doctor what other things that you can do to help prevent falls. This information is not intended to replace advice given to you by your health care provider. Make sure you discuss any questions you have with your health care provider. Document Released: 06/05/2009 Document Revised: 01/15/2016 Document Reviewed: 09/13/2014 Elsevier Interactive Patient Education  2017 [employer].

## 2020-06-23 NOTE — Progress Notes (Unsigned)
Prior Authorization for Advair completed. Spoke with Juanda Crumble from [employer] [phone] ref #[id number]. Lost connection 20 minutes into call the first time. Had to call back and restart process.

## 2020-06-25 ENCOUNTER — Other Ambulatory Visit: Payer: Self-pay

## 2020-06-25 ENCOUNTER — Other Ambulatory Visit
Admission: RE | Admit: 2020-06-25 | Discharge: 2020-06-25 | Disposition: A | Discharge status: Home / Self Care | Payer: Medicare PPO | Source: Ambulatory Visit | Attending: Gastroenterology | Admitting: Gastroenterology

## 2020-06-25 DIAGNOSIS — Z20822 Contact with and (suspected) exposure to covid-19: Secondary | ICD-10-CM | POA: Diagnosis not present

## 2020-06-25 DIAGNOSIS — Z01812 Encounter for preprocedural laboratory examination: Secondary | ICD-10-CM | POA: Diagnosis not present

## 2020-06-25 LAB — SARS CORONAVIRUS 2 (TAT 6-24 HRS): SARS Coronavirus 2: NEGATIVE

## 2020-06-27 ENCOUNTER — Other Ambulatory Visit: Payer: Self-pay

## 2020-06-27 ENCOUNTER — Ambulatory Visit: Payer: Medicare PPO | Admitting: Anesthesiology

## 2020-06-27 ENCOUNTER — Encounter
Admission: RE | Disposition: A | Discharge status: Home / Self Care | Payer: Self-pay | Source: Home / Self Care | Attending: Gastroenterology

## 2020-06-27 ENCOUNTER — Ambulatory Visit
Admission: RE | Admit: 2020-06-27 | Discharge: 2020-06-27 | Disposition: A | Discharge status: Home / Self Care | Payer: Medicare PPO | Attending: Gastroenterology | Admitting: Gastroenterology

## 2020-06-27 DIAGNOSIS — Z7951 Long term (current) use of inhaled steroids: Secondary | ICD-10-CM | POA: Diagnosis not present

## 2020-06-27 DIAGNOSIS — I5032 Chronic diastolic (congestive) heart failure: Secondary | ICD-10-CM | POA: Insufficient documentation

## 2020-06-27 DIAGNOSIS — E785 Hyperlipidemia, unspecified: Secondary | ICD-10-CM | POA: Diagnosis not present

## 2020-06-27 DIAGNOSIS — N183 Chronic kidney disease, stage 3 unspecified: Secondary | ICD-10-CM | POA: Diagnosis not present

## 2020-06-27 DIAGNOSIS — E1122 Type 2 diabetes mellitus with diabetic chronic kidney disease: Secondary | ICD-10-CM | POA: Insufficient documentation

## 2020-06-27 DIAGNOSIS — Z79899 Other long term (current) drug therapy: Secondary | ICD-10-CM | POA: Insufficient documentation

## 2020-06-27 DIAGNOSIS — I130 Hypertensive heart and chronic kidney disease with heart failure and stage 1 through stage 4 chronic kidney disease, or unspecified chronic kidney disease: Secondary | ICD-10-CM | POA: Insufficient documentation

## 2020-06-27 DIAGNOSIS — K635 Polyp of colon: Secondary | ICD-10-CM

## 2020-06-27 DIAGNOSIS — Z86010 Personal history of colonic polyps: Secondary | ICD-10-CM | POA: Diagnosis not present

## 2020-06-27 DIAGNOSIS — Z794 Long term (current) use of insulin: Secondary | ICD-10-CM | POA: Diagnosis not present

## 2020-06-27 DIAGNOSIS — Z7901 Long term (current) use of anticoagulants: Secondary | ICD-10-CM | POA: Diagnosis not present

## 2020-06-27 DIAGNOSIS — D122 Benign neoplasm of ascending colon: Secondary | ICD-10-CM | POA: Diagnosis not present

## 2020-06-27 DIAGNOSIS — Z6841 Body Mass Index (BMI) 40.0 and over, adult: Secondary | ICD-10-CM | POA: Insufficient documentation

## 2020-06-27 DIAGNOSIS — Z8249 Family history of ischemic heart disease and other diseases of the circulatory system: Secondary | ICD-10-CM | POA: Insufficient documentation

## 2020-06-27 DIAGNOSIS — D120 Benign neoplasm of cecum: Secondary | ICD-10-CM | POA: Insufficient documentation

## 2020-06-27 DIAGNOSIS — Z1211 Encounter for screening for malignant neoplasm of colon: Secondary | ICD-10-CM | POA: Diagnosis not present

## 2020-06-27 HISTORY — PX: COLONOSCOPY WITH PROPOFOL: SHX5780

## 2020-06-27 LAB — GLUCOSE, CAPILLARY: Glucose-Capillary: 98 mg/dL (ref 70–99)

## 2020-06-27 SURGERY — COLONOSCOPY WITH PROPOFOL
Anesthesia: General

## 2020-06-27 MED ORDER — PROPOFOL 500 MG/50ML IV EMUL
INTRAVENOUS | Status: DC | PRN
  Administered 2020-06-27: 50 ug/kg/min via INTRAVENOUS

## 2020-06-27 MED ORDER — LIDOCAINE HCL (CARDIAC) PF 100 MG/5ML IV SOSY
PREFILLED_SYRINGE | INTRAVENOUS | Status: DC | PRN
  Administered 2020-06-27: 80 mg via INTRAVENOUS

## 2020-06-27 MED ORDER — PROPOFOL 10 MG/ML IV BOLUS
INTRAVENOUS | Status: DC | PRN
  Administered 2020-06-27: 50 mg via INTRAVENOUS

## 2020-06-27 MED ORDER — SODIUM CHLORIDE 0.9 % IV SOLN
INTRAVENOUS | Status: DC
  Administered 2020-06-27: 20 mL/h via INTRAVENOUS

## 2020-06-27 NOTE — Anesthesia Preprocedure Evaluation (Signed)
Anesthesia Evaluation  Patient identified by MRN, date of birth, ID band Patient awake    Reviewed: Allergy & Precautions, NPO status , Patient's Chart, lab work & pertinent test results  History of Anesthesia Complications Negative for: history of anesthetic complications  Airway Mallampati: II  TM Distance: >3 FB Neck ROM: Full    Dental no notable dental hx.    Pulmonary asthma , sleep apnea and Continuous Positive Airway Pressure Ventilation ,    breath sounds clear to auscultation- rhonchi (-) wheezing      Cardiovascular hypertension, Pt. on medications +CHF  (-) CAD, (-) Past MI, (-) Cardiac Stents and (-) CABG  Rhythm:Regular Rate:Normal - Systolic murmurs and - Diastolic murmurs Echo 53/74/82: - Left ventricle: The cavity size was normal. Systolic function was  normal. The estimated ejection fraction was in the range of 60%  to 65%. Wall motion was normal; there were no regional wall  motion abnormalities. The study is not technically sufficient to  allow evaluation of LV diastolic function.  - Ascending aorta: The ascending aorta was mildly dilated.  - Mitral valve: There was mild regurgitation.  - Left atrium: The atrium was severely dilated.  - Right ventricle: Systolic function was normal.  - Pulmonary arteries: Systolic pressure was mildly elevated. PA  peak pressure: 42 mm Hg (S).    Neuro/Psych neg Seizures Anxiety negative neurological ROS     GI/Hepatic negative GI ROS, Neg liver ROS,   Endo/Other  diabetes, Insulin Dependent  Renal/GU Renal disease     Musculoskeletal negative musculoskeletal ROS (+)   Abdominal (+) + obese,   Peds  Hematology  (+) anemia ,   Anesthesia Other Findings Past Medical History: No date: Allergic rhinitis, cause unspecified No date: Anxiety No date: Chronic diastolic CHF (congestive heart failure) (HCC) No date: CKD (chronic kidney disease), stage III  (HCC) No date: Edema No date: Essential hypertension, benign No date: Gout, unspecified No date: Heart murmur     Comment:  as child No date: Hyperlipidemia No date: Lipoma of unspecified site No date: Other and unspecified disc disorder of lumbar region No date: Other general symptoms(780.99) No date: PAC (premature atrial contraction) No date: Renal insufficiency No date: Restless legs syndrome (RLS) No date: Sebaceous cyst 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 No date: 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 complication, uncontrolled No date: Unspecified asthma(493.90) No date: Unspecified disease of pericardium No date: Unspecified sleep apnea No date: Unspecified vitamin D deficiency No date: Vaginitis and vulvovaginitis, unspecified No date: Venous insufficiency   Reproductive/Obstetrics                             Anesthesia Physical Anesthesia Plan  ASA: III  Anesthesia Plan: General   Post-op Pain Management:    Induction: Intravenous  PONV Risk Score and Plan: 2 and Propofol infusion  Airway Management Planned: Natural Airway and Nasal CPAP  Additional Equipment:   Intra-op Plan:   Post-operative Plan:   Informed Consent: I have reviewed the patients History and Physical, chart, labs and discussed the procedure including the risks, benefits and alternatives for the proposed anesthesia with the patient or authorized representative who has indicated his/her understanding and acceptance.     Dental advisory given  Plan Discussed with: CRNA and Anesthesiologist  Anesthesia Plan Comments:         Anesthesia Quick Evaluation

## 2020-06-27 NOTE — Transfer of Care (Signed)
Immediate Anesthesia Transfer of Care Note  Patient: Amber Reyes  Procedure(s) Performed: COLONOSCOPY WITH PROPOFOL (N/A )  Patient Location: PACU and Endoscopy Unit  Anesthesia Type:General  Level of Consciousness: awake, oriented, drowsy and patient cooperative  Airway & Oxygen Therapy: Patient Spontanous Breathing  Post-op Assessment: Report given to RN, Post -op Vital signs reviewed and stable and Patient moving all extremities  Post vital signs: Reviewed and stable  Last Vitals:  Vitals Value Taken Time  BP 154/61 06/27/20 0925  Temp    Pulse 69 06/27/20 0926  Resp 18 06/27/20 0926  SpO2 100 % 06/27/20 0926  Vitals shown include unvalidated device data.  Last Pain:  Vitals:   06/27/20 0900  TempSrc: Temporal  PainSc:          Complications: No complications documented.

## 2020-06-27 NOTE — H&P (Signed)
Jonathon Bellows, MD [address], [place], [place], [place], [address] [address], [place], [place], [place], [address] Phone: [phone]  Fax: [phone]  Primary Care Physician:  Steele Sizer, MD   Pre-Procedure History & Physical: HPI:  Amber Reyes is a 74 y.o. female is here for an colonoscopy.   Past Medical History:  Diagnosis Date  . Allergic rhinitis, cause unspecified   . Anxiety   . Chronic diastolic CHF (congestive heart failure) ([facility])   . CKD (chronic kidney disease), stage III ([employer])   . Edema   . Essential hypertension, benign   . Gout, unspecified   . Heart murmur    as child  . Hyperlipidemia   . Lipoma of unspecified site   . Other and unspecified disc disorder of lumbar region   . Other general symptoms(780.99)   . PAC (premature atrial contraction)   . Renal insufficiency   . Restless legs syndrome (RLS)   . Sebaceous cyst   . Super obesity   . Type II or unspecified type diabetes mellitus without mention of complication, uncontrolled   . Unspecified asthma(493.90)   . Unspecified disease of pericardium   . Unspecified sleep apnea   . Unspecified vitamin D deficiency   . Vaginitis and vulvovaginitis, unspecified   . Venous insufficiency     Past Surgical History:  Procedure Laterality Date  . CARDIAC CATHETERIZATION  2002   [facility]  . COLONOSCOPY WITH PROPOFOL N/A 09/25/2018   Procedure: COLONOSCOPY WITH PROPOFOL;  Surgeon: Jonathon Bellows, MD;  Location: [facility] ENDOSCOPY;  Service: Gastroenterology;  Laterality: N/A;  . COLONOSCOPY WITH PROPOFOL N/A 04/13/2019   Procedure: COLONOSCOPY WITH PROPOFOL;  Surgeon: Jonathon Bellows, MD;  Location: [facility] ENDOSCOPY;  Service: Gastroenterology;  Laterality: N/A;  . PERICARDIUM SURGERY      Prior to Admission medications   Medication Sig Start Date End Date Taking? Authorizing Provider  albuterol (PROVENTIL HFA;VENTOLIN HFA) 108 (90 BASE) MCG/ACT inhaler Inhale 2 puffs into the lungs every 6 (six) hours as  needed for wheezing or shortness of breath.   Yes [provider]  allopurinol (ZYLOPRIM) 100 MG tablet Take 1 tablet (100 mg total) by mouth 2 (two) times daily. 02/06/20  Yes Sowles, Drue Stager, MD  apixaban (ELIQUIS) 5 MG TABS tablet Take 1 tablet (5 mg total) by mouth 2 (two) times daily. 05/12/20  Yes Sowles, Drue Stager, MD  Blood Glucose Calibration (TRUE METRIX LEVEL 1) Low SOLN 1 each by In Vitro route once a week. 12/12/17  Yes Sowles, Drue Stager, MD  Blood Glucose Monitoring Suppl (TRUE METRIX AIR GLUCOSE METER) w/Device KIT Inject 1 each into the skin 4 (four) times daily as needed. Check fsbs 4 times daily E11.22 11/01/18  Yes Sowles, Drue Stager, MD  Cholecalciferol (VITAMIN D) 2000 UNITS tablet Take 2,000 Units by mouth daily.   Yes [provider]  colchicine (COLCRYS) 0.6 MG tablet Take 1-2 tablets (0.6-1.2 mg total) by mouth daily as needed. 11/25/17  Yes Sowles, Drue Stager, MD  diltiazem (CARDIZEM CD) 300 MG 24 hr capsule Take 1 capsule (300 mg total) by mouth daily. 02/06/20  Yes Sowles, Drue Stager, MD  DROPLET PEN NEEDLES 30G X 8 MM MISC USE TWICE DAILY WITH INSULIN 04/14/20  Yes Sowles, Drue Stager, MD  Dulaglutide (TRULICITY) 1.5 MW/4.1LK SOPN Inject 1.5 mg into the skin once a week. 05/12/20  Yes Steele Sizer, MD  Elastic Bandages & Supports (MEDICAL COMPRESSION STOCKINGS) [place] 2 each by Does not apply route daily. 04/20/16  Yes Steele Sizer, MD  fluticasone (FLONASE) 50 MCG/ACT nasal spray USE 2 SPRAYS IN EACH NOSTRIL EVERY DAY 08/13/19  Yes Sowles, Drue Stager, MD  furosemide (LASIX) 40 MG tablet TAKE 1 TABLET (40 MG TOTAL) BY MOUTH 2 (TWO) TIMES DAILY AS NEEDED. 10/31/19  Yes Sowles, Drue Stager, MD  Insulin Lispro Prot & Lispro (HUMALOG MIX 75/25 KWIKPEN) (75-25) 100 UNIT/ML Kwikpen Inject 22-50 Units into the skin 2 (two) times daily with a meal. 50 units before breakfast and 22 before dinner 05/12/20  Yes Sowles, Drue Stager, MD  irbesartan (AVAPRO) 300 MG tablet Take 1 tablet (300 mg total) by  mouth daily. 02/06/20  Yes Sowles, Drue Stager, MD  pravastatin (PRAVACHOL) 80 MG tablet Take 1 tablet (80 mg total) by mouth every evening. 02/06/20  Yes Sowles, Drue Stager, MD  TRUE METRIX BLOOD GLUCOSE TEST test strip CHECK BLOOD SUGAR FOUR TIMES DAILY 05/30/20  Yes Sowles, Drue Stager, MD  Fluticasone-Salmeterol (ADVAIR DISKUS) 250-50 MCG/DOSE AEPB Inhale 1 puff into the lungs 2 (two) times daily. Patient not taking: Reported on 06/19/2020 05/12/20   Steele Sizer, MD    Allergies as of 05/22/2020  . (No Known Allergies)    Family History  Problem Relation Age of Onset  . Heart attack Mother   . Hypertension Mother   . Breast cancer Neg Hx     Social History   Socioeconomic History  . Marital status: Married    Spouse name: Not on file  . Number of children: 3  . Years of education: Not on file  . Highest education level: Associate degree: academic program  Occupational History  . Occupation: retired  Tobacco Use  . Smoking status: Never Smoker  . Smokeless tobacco: Never Used  . Tobacco comment: n/a  Vaping Use  . Vaping Use: Never used  Substance and Sexual Activity  . Alcohol use: No    Alcohol/week: 0.0 standard drinks  . Drug use: No  . Sexual activity: Not Currently  Other Topics Concern  . Not on file  Social History Narrative  . Not on file   Social Determinants of Health   Financial Resource Strain: High Risk  . Difficulty of Paying Living Expenses: Hard  Food Insecurity: No Food Insecurity  . Worried About Charity fundraiser in the Last Year: Never true  . Ran Out of Food in the Last Year: Never true  Transportation Needs: No Transportation Needs  . Lack of Transportation (Medical): No  . Lack of Transportation (Non-Medical): No  Physical Activity: Inactive  . Days of Exercise per Week: 0 days  . Minutes of Exercise per Session: 0 min  Stress: No Stress Concern Present  . Feeling of Stress : Only a little  Social Connections: Socially Integrated  .  Frequency of Communication with Friends and Family: More than three times a week  . Frequency of Social Gatherings with Friends and Family: Once a week  . Attends Religious Services: More than 4 times per year  . Active Member of Clubs or Organizations: Yes  . Attends Archivist Meetings: More than 4 times per year  . Marital Status: Married  Human resources officer Violence: Not At Risk  . Fear of Current or Ex-Partner: No  . Emotionally Abused: No  . Physically Abused: No  . Sexually Abused: No    Review of Systems: See HPI, otherwise negative ROS  Physical Exam: BP (!) 189/85   Pulse 70   Temp (!) 96.6 F (35.9 C) (Temporal)   Resp 20   Ht '5\' 10"'  (1.778  m)   Wt (!) 173.7 kg   SpO2 100%   BMI 54.95 kg/m  General:   Alert,  pleasant and cooperative in NAD Head:  Normocephalic and atraumatic. Neck:  Supple; no masses or thyromegaly. Lungs:  Clear throughout to auscultation, normal respiratory effort.    Heart:  +S1, +S2, Regular rate and rhythm, No edema. Abdomen:  Soft, nontender and nondistended. Normal bowel sounds, without guarding, and without rebound.   Neurologic:  Alert and  oriented x4;  grossly normal neurologically.  Impression/Plan: Amber Reyes is here for an colonoscopy to be performed for surveillance due to prior history of colon polyps   Risks, benefits, limitations, and alternatives regarding  colonoscopy have been reviewed with the patient.  Questions have been answered.  All parties agreeable.   Jonathon Bellows, MD  06/27/2020, 8:42 AM

## 2020-06-27 NOTE — Anesthesia Postprocedure Evaluation (Signed)
Anesthesia Post Note  Patient: Amber Reyes  Procedure(s) Performed: COLONOSCOPY WITH PROPOFOL (N/A )  Patient location during evaluation: Endoscopy Anesthesia Type: General Level of consciousness: awake and alert and oriented Pain management: pain level controlled Vital Signs Assessment: post-procedure vital signs reviewed and stable Respiratory status: spontaneous breathing, nonlabored ventilation and respiratory function stable Cardiovascular status: blood pressure returned to baseline and stable Postop Assessment: no signs of nausea or vomiting Anesthetic complications: no   No complications documented.   Last Vitals:  Vitals:   06/27/20 0940 06/27/20 0950  BP: 135/74 (!) 161/59  Pulse: 64 65  Resp: (!) 22 17  Temp:    SpO2: 99% 99%    Last Pain:  Vitals:   06/27/20 0920  TempSrc: Temporal  PainSc:                  Doralene Glanz

## 2020-06-27 NOTE — Op Note (Signed)
[facility] Gastroenterology Patient Name: Amber Reyes Procedure Date: 06/27/2020 8:54 AM MRN: [id number] Account #: [id number] Date of Birth: 03-Jun-1946 Admit Type: Outpatient Age: 74 Room: [facility] ENDO ROOM 4 Gender: Female Note Status: Finalized Procedure:             Colonoscopy Indications:           Surveillance: Personal history of piecemeal removal of                         adenoma on last colonoscopy (less than 1 year ago) Providers:             Jonathon Bellows MD, MD Medicines:             Monitored Anesthesia Care Complications:         No immediate complications. Procedure:             Pre-Anesthesia Assessment:                        - Prior to the procedure, a History and Physical was                         performed, and patient medications, allergies and                         sensitivities were reviewed. The patient's tolerance                         of previous anesthesia was reviewed.                        - The risks and benefits of the procedure and the                         sedation options and risks were discussed with the                         patient. All questions were answered and informed                         consent was obtained.                        - ASA Grade Assessment: II - A patient with mild                         systemic disease.                        After obtaining informed consent, the colonoscope was                         passed under direct vision. Throughout the procedure,                         the patient's blood pressure, pulse, and oxygen                         saturations were monitored continuously. The  Colonoscope was introduced through the anus and                         advanced to the the cecum, identified by the                         appendiceal orifice. The colonoscopy was performed                         with ease. The patient tolerated the procedure well.                          The quality of the bowel preparation was excellent. Findings:      The perianal and digital rectal examinations were normal.      Two sessile polyps were found in the ascending colon. The polyps were 4       to 5 mm in size. These polyps were removed with a cold snare. Resection       and retrieval were complete.      A 3 mm polyp was found in the cecum. The polyp was sessile. The polyp       was removed with a cold biopsy forceps. Resection and retrieval were       complete.      The exam was otherwise without abnormality on direct and retroflexion       views. Impression:            - Two 4 to 5 mm polyps in the ascending colon, removed                         with a cold snare. Resected and retrieved.                        - One 3 mm polyp in the cecum, removed with a cold                         biopsy forceps. Resected and retrieved.                        - The examination was otherwise normal on direct and                         retroflexion views. Recommendation:        - Discharge patient to home (with escort).                        - Resume previous diet.                        - Continue present medications.                        - Await pathology results.                        - Repeat colonoscopy in 3 years for surveillance. Procedure Code(s):     --- Professional ---                        [id number], Colonoscopy,  flexible; with removal of                         tumor(s), polyp(s), or other lesion(s) by snare                         technique                        45380, 59, Colonoscopy, flexible; with biopsy, single                         or multiple Diagnosis Code(s):     --- Professional ---                        K63.5, Polyp of colon                        Z09, Encounter for follow-up examination after                         completed treatment for conditions other than                         malignant neoplasm                        Z86.010,  Personal history of colonic polyps CPT copyright 2019 American Medical Association. All rights reserved. The codes documented in this report are preliminary and upon coder review may  be revised to meet current compliance requirements. Jonathon Bellows, MD Jonathon Bellows MD, MD 06/27/2020 9:23:54 AM This report has been signed electronically. Number of Addenda: 0 Note Initiated On: 06/27/2020 8:54 AM Scope Withdrawal Time: 0 hours 15 minutes 57 seconds  Total Procedure Duration: 0 hours 18 minutes 41 seconds  Estimated Blood Loss:  Estimated blood loss: none.      [facility]

## 2020-06-30 ENCOUNTER — Encounter: Payer: Self-pay | Admitting: Gastroenterology

## 2020-06-30 LAB — SURGICAL PATHOLOGY

## 2020-07-07 ENCOUNTER — Telehealth: Payer: Self-pay

## 2020-07-07 NOTE — Progress Notes (Signed)
Chronic Care Management Pharmacy Assistant   Name: Amber Reyes  MRN: [id number] DOB: Dec 15, 1945  Reason for Encounter: Disease State  Patient Questions:  1.  Have you seen any other providers since your last visit? Yes, Steele Sizer, Jonathon Bellows,   2.  Any changes in your medicines or health? No   [name],  74 y.o. , female presents for their Follow-Up CCM visit with the clinical pharmacist via telephone.  PCP : Steele Sizer, MD  Allergies:  No Known Allergies  Medications: Outpatient Encounter Medications as of 07/07/2020  Medication Sig Note  . albuterol (PROVENTIL HFA;VENTOLIN HFA) 108 (90 BASE) MCG/ACT inhaler Inhale 2 puffs into the lungs every 6 (six) hours as needed for wheezing or shortness of breath.   . allopurinol (ZYLOPRIM) 100 MG tablet Take 1 tablet (100 mg total) by mouth 2 (two) times daily.   Marland Kitchen apixaban (ELIQUIS) 5 MG TABS tablet Take 1 tablet (5 mg total) by mouth 2 (two) times daily.   . Blood Glucose Calibration (TRUE METRIX LEVEL 1) Low SOLN 1 each by In Vitro route once a week.   . Blood Glucose Monitoring Suppl (TRUE METRIX AIR GLUCOSE METER) w/Device KIT Inject 1 each into the skin 4 (four) times daily as needed. Check fsbs 4 times daily E11.22   . Cholecalciferol (VITAMIN D) 2000 UNITS tablet Take 2,000 Units by mouth daily.   . colchicine (COLCRYS) 0.6 MG tablet Take 1-2 tablets (0.6-1.2 mg total) by mouth daily as needed.   . diltiazem (CARDIZEM CD) 300 MG 24 hr capsule Take 1 capsule (300 mg total) by mouth daily.   . DROPLET PEN NEEDLES 30G X 8 MM MISC USE TWICE DAILY WITH INSULIN   . Dulaglutide (TRULICITY) 1.5 VU/0.2BX SOPN Inject 1.5 mg into the skin once a week.   Regino Schultze Bandages & Supports (MEDICAL COMPRESSION STOCKINGS) MISC 2 each by Does not apply route daily.   . fluticasone (FLONASE) 50 MCG/ACT nasal spray USE 2 SPRAYS IN EACH NOSTRIL EVERY DAY   . Fluticasone-Salmeterol (ADVAIR DISKUS) 250-50 MCG/DOSE AEPB Inhale 1  puff into the lungs 2 (two) times daily. (Patient not taking: Reported on 06/19/2020) 06/19/2020: Awaiting prior authorization  . furosemide (LASIX) 40 MG tablet TAKE 1 TABLET (40 MG TOTAL) BY MOUTH 2 (TWO) TIMES DAILY AS NEEDED.   Marland Kitchen Insulin Lispro Prot & Lispro (HUMALOG MIX 75/25 KWIKPEN) (75-25) 100 UNIT/ML Kwikpen Inject 22-50 Units into the skin 2 (two) times daily with a meal. 50 units before breakfast and 22 before dinner   . irbesartan (AVAPRO) 300 MG tablet Take 1 tablet (300 mg total) by mouth daily.   . pravastatin (PRAVACHOL) 80 MG tablet Take 1 tablet (80 mg total) by mouth every evening.   . TRUE METRIX BLOOD GLUCOSE TEST test strip CHECK BLOOD SUGAR FOUR TIMES DAILY    No facility-administered encounter medications on file as of 07/07/2020.    Current Diagnosis: Patient Active Problem List   Diagnosis Date Noted  . 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 History of colonic polyps   . Polyp of colon   . Thoracic aortic atherosclerosis ([place]) 12/30/2016  . Controlled type 2 diabetes mellitus with stage 3 chronic kidney disease ([place]) 12/23/2016  . Venous insufficiency 05/21/2016  . Hypertensive heart disease 05/21/2016  . Anemia in chronic kidney disease 12/22/2015  . Pulmonary hypertension ([place]) 12/18/2015  . 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 Anxiety 04/12/2015  .  Chronic kidney disease (CKD), stage III (moderate) ([place]) 04/12/2015  . Edema extremities 04/12/2015  . 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 Asthma, moderate persistent 04/12/2015  . Morbid obesity, unspecified obesity type ([place]) 04/12/2015  . Obstructive apnea 04/12/2015  . Restless leg 04/12/2015  . Allergic rhinitis 04/12/2015  . Vitamin D deficiency 04/12/2015  . Controlled gout 04/12/2015  . Premature atrial contractions 11/29/2013  . Atrial fibrillation ([place]) 11/09/2013  . Benign essential hypertension   . Hyperlipidemia     Goals  Addressed   None     Follow-Up:  Pharmacist Review  .Marland Kitchen Recent Relevant Labs: Lab Results  Component Value Date/Time   HGBA1C 6.9 (A) 05/12/2020 08:38 AM   HGBA1C 6.7 (A) 10/08/2019 08:24 AM   HGBA1C 6.8 (H) 05/14/2019 12:00 AM   HGBA1C 6.5 01/01/2019 07:48 AM   HGBA1C 6.6 08/30/2018 07:46 AM   HGBA1C 6.6 (A) 08/30/2018 07:46 AM   HGBA1C 6.6 08/30/2018 07:46 AM   MICROALBUR 5.4 05/14/2019 12:00 AM   MICROALBUR 5.7 04/25/2018 09:29 AM   MICROALBUR 20 08/15/2015 08:12 AM    Kidney Function Lab Results  Component Value Date/Time   CREATININE 1.29 (H) 05/12/2020 08:59 AM   CREATININE 1.35 (H) 05/14/2019 12:00 AM   GFRNONAA 41 (L) 05/12/2020 08:59 AM   GFRAA 48 (L) 05/12/2020 08:59 AM    . Current antihyperglycemic regimen:  pravastatin (PRAVACHOL) 80 MG tablet  irbesartan (AVAPRO) 300 MG tablet  DROPLET PEN NEEDLES 30G X 8 MM MISC  Dulaglutide (TRULICITY) 1.5 UJ/8.1XB SOPN  Insulin Lispro Prot & Lispro (HUMALOG MIX 75/25 KWIKPEN) (75-25) 100 UNIT/ML Kwikpen   . What recent interventions/DTPs have been made to improve glycemic control:  o No new interventions/DTPS . Have there been any recent hospitalizations or ED visits since last visit with CPP? No . Patient denies hypoglycemic symptoms, including None . Patient denies hyperglycemic symptoms, including none . How often are you checking your blood sugar? once daily and twice daily . What are your blood sugars ranging?  o Fasting: 109 o Before meals: 128 o After meals: 172 o Bedtime:  Blood glucose testing is not done on regular basis but most days she checks it two to three times. Does not check prior to bed. . During the week, how often does your blood glucose drop below 70? Never . Are you checking your feet daily/regularly? Yes  Adherence Review: Is the patient currently on a STATIN medication? Yes Is the patient currently on ACE/ARB medication? Yes Does the patient have >5 day gap between last estimated fill  dates? No Medication is received via PAP.  Denies any lapse of coverage.     [employer]ollie [employer]

## 2020-07-25 DIAGNOSIS — G4733 Obstructive sleep apnea (adult) (pediatric): Secondary | ICD-10-CM | POA: Diagnosis not present

## 2020-07-28 ENCOUNTER — Other Ambulatory Visit: Payer: Self-pay | Admitting: Family Medicine

## 2020-08-06 ENCOUNTER — Other Ambulatory Visit: Payer: Self-pay | Admitting: Family Medicine

## 2020-08-06 DIAGNOSIS — E785 Hyperlipidemia, unspecified: Secondary | ICD-10-CM

## 2020-08-06 DIAGNOSIS — I4891 Unspecified atrial fibrillation: Secondary | ICD-10-CM

## 2020-08-06 DIAGNOSIS — E1169 Type 2 diabetes mellitus with other specified complication: Secondary | ICD-10-CM

## 2020-08-06 DIAGNOSIS — M109 Gout, unspecified: Secondary | ICD-10-CM

## 2020-08-06 DIAGNOSIS — I110 Hypertensive heart disease with heart failure: Secondary | ICD-10-CM

## 2020-08-06 DIAGNOSIS — I10 Essential (primary) hypertension: Secondary | ICD-10-CM

## 2020-08-13 ENCOUNTER — Other Ambulatory Visit: Payer: Self-pay | Admitting: Family Medicine

## 2020-08-13 DIAGNOSIS — I10 Essential (primary) hypertension: Secondary | ICD-10-CM

## 2020-08-18 ENCOUNTER — Telehealth: Payer: Self-pay

## 2020-08-18 DIAGNOSIS — E119 Type 2 diabetes mellitus without complications: Secondary | ICD-10-CM | POA: Diagnosis not present

## 2020-08-18 LAB — HM DIABETES EYE EXAM

## 2020-08-18 NOTE — Progress Notes (Signed)
Pt calling in regards to her Eliquis PAP. Explained that application renewal was needed and she must first hit the $600 out of pocket expense prior to renewal. Detail conversation regarding how to keep track of expenses for patient assistance needs. Will also look for additional resources for medication.  Cost is $390 every 90 days, after pt meets deductible.

## 2020-08-22 ENCOUNTER — Other Ambulatory Visit: Payer: Self-pay | Admitting: Cardiovascular Disease

## 2020-08-22 DIAGNOSIS — I4891 Unspecified atrial fibrillation: Secondary | ICD-10-CM

## 2020-08-25 NOTE — Telephone Encounter (Signed)
Refill request

## 2020-08-25 NOTE — Telephone Encounter (Signed)
Pt's age 75, wt 173.7 kg, SCr 1.29, CrCl 108.09, last ov w/ MA 11/15/19.

## 2020-09-02 ENCOUNTER — Telehealth: Payer: Self-pay

## 2020-09-02 NOTE — Chronic Care Management (AMB) (Signed)
09/02/2020  Called patient to remind her of appointment with Daron Offer  On 09/03/2020 @ 8:30 AM   Patient is aware to have all Medications and supplements available at time of appointment.  Daron Offer, CPP notified  Judithann Sheen, [facility] Clinical Pharmacist Assistant [phone]

## 2020-09-03 ENCOUNTER — Telehealth: Payer: Self-pay

## 2020-09-03 ENCOUNTER — Ambulatory Visit: Payer: Medicare PPO

## 2020-09-03 DIAGNOSIS — N183 Chronic kidney disease, stage 3 unspecified: Secondary | ICD-10-CM

## 2020-09-03 DIAGNOSIS — E1122 Type 2 diabetes mellitus with diabetic chronic kidney disease: Secondary | ICD-10-CM

## 2020-09-03 DIAGNOSIS — E1169 Type 2 diabetes mellitus with other specified complication: Secondary | ICD-10-CM

## 2020-09-03 DIAGNOSIS — E785 Hyperlipidemia, unspecified: Secondary | ICD-10-CM

## 2020-09-03 NOTE — Chronic Care Management (AMB) (Signed)
Chronic Care Management Pharmacy Assistant   Name: Amber Reyes  MRN: [id number] DOB: 02-Feb-1946  Reason for Encounter: Patient Assistance Coordination  PCP : Steele Sizer, MD   09/03/2020- Patient assistance renewal forms filled out for Trulicity and Humalog with [employer] Patient assistance program and [employer] with [place] patient assistance program. Patient aware that forms have been filled out and needing her signature and income documentation. Patient aware once she has completed to bring forms and income documentation to Dr Ancil Boozer office to be faxed to assistance program to start the process in receiving her medication. Patient is very thankful for the help. [name], CPP notified.   Allergies:  No Known Allergies  Medications: Outpatient Encounter Medications as of 09/03/2020  Medication Sig  . albuterol (PROVENTIL HFA;VENTOLIN HFA) 108 (90 BASE) MCG/ACT inhaler Inhale 2 puffs into the lungs every 6 (six) hours as needed for wheezing or shortness of breath.  . allopurinol (ZYLOPRIM) 100 MG tablet TAKE 1 TABLET TWICE DAILY  . Blood Glucose Calibration (TRUE METRIX LEVEL 1) Low SOLN 1 each by In Vitro route once a week.  . Blood Glucose Monitoring Suppl (TRUE METRIX AIR GLUCOSE METER) w/Device KIT Inject 1 each into the skin 4 (four) times daily as needed. Check fsbs 4 times daily E11.22  . Cholecalciferol (VITAMIN D) 2000 UNITS tablet Take 2,000 Units by mouth daily.  . colchicine (COLCRYS) 0.6 MG tablet Take 1-2 tablets (0.6-1.2 mg total) by mouth daily as needed. (Patient not taking: Reported on 09/03/2020)  . diltiazem (CARDIZEM CD) 300 MG 24 hr capsule TAKE 1 CAPSULE EVERY DAY  . DROPLET PEN NEEDLES 30G X 8 MM MISC USE TWICE DAILY WITH INSULIN  . Dulaglutide (TRULICITY) 1.5 XY/5.8PF SOPN Inject 1.5 mg into the skin once a week.  Regino Schultze Bandages & Supports (MEDICAL COMPRESSION STOCKINGS) MISC 2 each by Does not apply route daily.  Marland Kitchen ELIQUIS 5 MG TABS tablet TAKE 1  TABLET TWICE DAILY  . fluticasone (FLONASE) 50 MCG/ACT nasal spray USE 2 SPRAYS IN EACH NOSTRIL EVERY DAY  . Fluticasone-Salmeterol (ADVAIR DISKUS) 250-50 MCG/DOSE AEPB Inhale 1 puff into the lungs 2 (two) times daily.  . furosemide (LASIX) 40 MG tablet TAKE 1 TABLET (40 MG TOTAL) BY MOUTH 2 (TWO) TIMES DAILY AS NEEDED.  Marland Kitchen Insulin Lispro Prot & Lispro (HUMALOG MIX 75/25 KWIKPEN) (75-25) 100 UNIT/ML Kwikpen Inject 22-50 Units into the skin 2 (two) times daily with a meal. 50 units before breakfast and 22 before dinner  . irbesartan (AVAPRO) 300 MG tablet TAKE 1 TABLET EVERY DAY  . pravastatin (PRAVACHOL) 80 MG tablet TAKE 1 TABLET (80 MG TOTAL) BY MOUTH EVERY EVENING.  . TRUE METRIX BLOOD GLUCOSE TEST test strip CHECK BLOOD SUGAR FOUR TIMES DAILY   No facility-administered encounter medications on file as of 09/03/2020.    Current Diagnosis: Patient Active Problem List   Diagnosis Date Noted  . 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 History of colonic polyps   . Polyp of colon   . Thoracic aortic atherosclerosis ([place]) 12/30/2016  . Controlled type 2 diabetes mellitus with stage 3 chronic kidney disease ([place]) 12/23/2016  . Venous insufficiency 05/21/2016  . Hypertensive heart disease 05/21/2016  . Anemia in chronic kidney disease 12/22/2015  . Pulmonary hypertension ([place]) 12/18/2015  . 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 Anxiety 04/12/2015  . Chronic kidney disease (CKD), stage III (moderate) ([place]) 04/12/2015  . Edema extremities 04/12/2015  .  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 Asthma, moderate persistent 04/12/2015  . Morbid obesity, unspecified obesity type ([place]) 04/12/2015  . Obstructive apnea 04/12/2015  . Restless leg 04/12/2015  . Allergic rhinitis 04/12/2015  . Vitamin D deficiency 04/12/2015  . Controlled gout 04/12/2015  . Premature atrial contractions 11/29/2013  . Atrial fibrillation ([place]) 11/09/2013   . Benign essential hypertension   . Hyperlipidemia      Follow-Up:  Patient [place], [place] Pharmacist Assistant [phone]

## 2020-09-03 NOTE — Chronic Care Management (AMB) (Signed)
Chronic Care Management Pharmacy  Name: Amber Reyes  MRN: [id number] DOB: October 03, 1945  Chief Complaint/ HPI  Amber Reyes,  75 y.o. , female presents for their Follow-Up CCM visit with the clinical pharmacist via telephone due to COVID-19 Pandemic.  PCP : Steele Sizer, MD   Patient Care Team: Steele Sizer, MD as PCP - General (Family Medicine) Wellington Hampshire, MD as PCP - Cardiology (Cardiology) Wellington Hampshire, MD as Consulting Physician (Cardiology) Mottl, Andrez Grime, MD as Referring Physician (Nephrology) Paulla Dolly Tamala Fothergill, DPM as Consulting Physician (Podiatry) Germaine Pomfret, [facility] as Pharmacist (Pharmacist)  Their chronic conditions include: Hypertension, Hyperlipidemia, Diabetes, Atrial Fibrillation, Asthma, Chronic Kidney Disease, Gout, and Allergic Rhinitis  Office Visits: 06/19/20: Patient presented to Clemetine Marker, LPN for AWV.  9/47/65: Patient presented to Dr. Ancil Boozer for follow-up. A1c stable at 6.9%. No medication changes made.    Consult Visit: 06/27/20: Colonoscopy 05/16/20: Colonoscopy  Medications: Outpatient Encounter Medications as of 09/03/2020  Medication Sig  . allopurinol (ZYLOPRIM) 100 MG tablet TAKE 1 TABLET TWICE DAILY  . Cholecalciferol (VITAMIN D) 2000 UNITS tablet Take 2,000 Units by mouth daily.  Marland Kitchen diltiazem (CARDIZEM CD) 300 MG 24 hr capsule TAKE 1 CAPSULE EVERY DAY  . Dulaglutide (TRULICITY) 1.5 YY/5.0PT SOPN Inject 1.5 mg into the skin once a week.  Marland Kitchen ELIQUIS 5 MG TABS tablet TAKE 1 TABLET TWICE DAILY  . fluticasone (FLONASE) 50 MCG/ACT nasal spray USE 2 SPRAYS IN EACH NOSTRIL EVERY DAY  . Fluticasone-Salmeterol (ADVAIR DISKUS) 250-50 MCG/DOSE AEPB Inhale 1 puff into the lungs 2 (two) times daily.  . furosemide (LASIX) 40 MG tablet TAKE 1 TABLET (40 MG TOTAL) BY MOUTH 2 (TWO) TIMES DAILY AS NEEDED.  Marland Kitchen Insulin Lispro Prot & Lispro (HUMALOG MIX 75/25 KWIKPEN) (75-25) 100 UNIT/ML Kwikpen Inject 22-50 Units into the skin 2 (two)  times daily with a meal. 50 units before breakfast and 22 before dinner  . irbesartan (AVAPRO) 300 MG tablet TAKE 1 TABLET EVERY DAY  . pravastatin (PRAVACHOL) 80 MG tablet TAKE 1 TABLET (80 MG TOTAL) BY MOUTH EVERY EVENING.  Marland Kitchen albuterol (PROVENTIL HFA;VENTOLIN HFA) 108 (90 BASE) MCG/ACT inhaler Inhale 2 puffs into the lungs every 6 (six) hours as needed for wheezing or shortness of breath.  . Blood Glucose Calibration (TRUE METRIX LEVEL 1) Low SOLN 1 each by In Vitro route once a week.  . Blood Glucose Monitoring Suppl (TRUE METRIX AIR GLUCOSE METER) w/Device KIT Inject 1 each into the skin 4 (four) times daily as needed. Check fsbs 4 times daily E11.22  . colchicine (COLCRYS) 0.6 MG tablet Take 1-2 tablets (0.6-1.2 mg total) by mouth daily as needed. (Patient not taking: Reported on 09/03/2020)  . DROPLET PEN NEEDLES 30G X 8 MM MISC USE TWICE DAILY WITH INSULIN  . Elastic Bandages & Supports (MEDICAL COMPRESSION STOCKINGS) MISC 2 each by Does not apply route daily.  . TRUE METRIX BLOOD GLUCOSE TEST test strip CHECK BLOOD SUGAR FOUR TIMES DAILY   No facility-administered encounter medications on file as of 09/03/2020.   Financial Resource Strain: Medium Risk  . Difficulty of Paying Living Expenses: Somewhat hard     Current Diagnosis/Assessment:  Goals Addressed            This Visit's Progress   . Chronic Care Management       CARE PLAN ENTRY (see longitudinal plan of care for additional care plan information)  Current Barriers:  . Chronic Disease Management support, education, and care  coordination needs related to Hypertension, Hyperlipidemia, Diabetes, Atrial Fibrillation, Asthma, Chronic Kidney Disease, Gout, and Allergic Rhinitis   Hypertension BP Readings from Last 3 Encounters:  06/27/20 (!) 161/59  05/12/20 132/74  02/06/20 135/63   . Pharmacist Clinical Goal(s): o Over the next 90 days, patient will work with PharmD and providers to maintain BP goal <130/80 . Current  regimen:  o Diltiazem CR 356m daily o Irbesartan 3071mdaily . Interventions: o None . Patient self care activities - Over the next 90 days, patient will: o Check BP weekly, document, and provide at future appointments o Ensure daily salt intake < 2300 mg/day  Hyperlipidemia Lab Results  Component Value Date/Time   LDLCALC 52 05/12/2020 08:59 AM   . Pharmacist Clinical Goal(s): o Over the next 90 days, patient will work with PharmD and providers to maintain LDL goal < 70 . Current regimen:  o Pravastatin 8077maily . Interventions: o Take pravastatin at bedtime for maximum effectiveness . Patient self care activities - Over the next 90 days, patient will: o Take pravastatin at bedtime o Maintain positive lifestyle changes  Diabetes Lab Results  Component Value Date/Time   HGBA1C 6.9 (A) 05/12/2020 08:38 AM   HGBA1C 6.7 (A) 10/08/2019 08:24 AM   HGBA1C 6.8 (H) 05/14/2019 12:00 AM   HGBA1C 6.5 01/01/2019 07:48 AM   HGBA1C 6.6 08/30/2018 07:46 AM   HGBA1C 6.6 (A) 08/30/2018 07:46 AM   HGBA1C 6.6 08/30/2018 07:46 AM   . Pharmacist Clinical Goal(s): o Over the next 90 days, patient will work with PharmD and providers to maintain A1c goal <7% . Current regimen:  o Humalog 75/25 inject 50 units every morning and 20 units every evening o Trulicity inject 1.50.0TMekly . Interventions: o Will start patient assistance for Trulicity and Humalog  . Patient self care activities - Over the next 90 days, patient will: o Check blood sugar once daily, document, and provide at future appointments o Contact provider with any episodes of hypoglycemia  Asthma . Pharmacist Clinical Goal(s) o Over the next 90 days, patient will work with PharmD and providers to improve breathing . Current regimen:  o Advair 250/50 mcg 1 puff by mouth twice daily o Ventolin HFA 2 puffs by mouth as needed . Interventions: o Will start patient assistance for Advair . Patient self care activities - Over  the next 90 days, patient will: o Report to PharmD if rescue inhaler usage increases  Medication management . Pharmacist Clinical Goal(s): o Over the next 89 days, patient will work with PharmD and providers to maintain optimal medication adherence . Current pharmacy: [facility] . Interventions o Comprehensive medication review performed. o Continue current medication management strategy . Patient self care activities - Over the next 90 days, patient will: o Focus on medication adherence by timing medications for most comfort and benefit o Take medications as prescribed o Report any questions or concerns to PharmD and/or provider(s)      Hypertension   BP goal is:  <130/80  Office blood pressures are  BP Readings from Last 3 Encounters:  06/27/20 (!) 161/59  05/12/20 132/74  02/06/20 135/63   Patient checks BP at home infrequently Patient home BP readings are ranging: 130/66  Patient has failed these meds in the past: NA Patient is currently controlled on the following medications:  . Diltiazem CD 300m56mily . Furosemide 40 mg twice daily as needed (uses every other day)  . Irbesartan 300mg16mly  We discussed: Had coughing fits. Taking  BP med at night helps.  Plan  Continue current medications   AFIB   Patient is currently rate controlled.  Patient has failed these meds in past: NA Patient is currently controlled on the following medications: . Diltiazem CD 332m daily . Eliquis 5 mg twice daily   We discussed:  Denies unusual bruising/bleeding. Eliquis PAP will be restarted once patient reaches $600 out of pocket spending requirement.   Plan  Continue current medications  Hyperlipidemia   LDL goal < 70  Last lipids Lab Results  Component Value Date   CHOL 115 05/12/2020   HDL 47 (L) 05/12/2020   LDLCALC 52 05/12/2020   TRIG 82 05/12/2020   CHOLHDL 2.4 05/12/2020   Hepatic Function Latest Ref Rng & Units 05/12/2020 05/14/2019 11/25/2017  Total Protein  6.1 - 8.1 g/dL 6.8 6.6 6.4  Albumin 3.6 - 5.1 g/dL - - -  AST 10 - 35 U/L _0 ALT 6 - 29 U/L 8 5(L) 7  Alk Phosphatase 33 - 130 U/L - - -  Total Bilirubin 0.2 - 1.2 mg/dL 0.5 0.5 0.3  Bilirubin, Direct 0.0 - 0.2 mg/dL - - 0.0     The ASCVD Risk score (GMillbury, et al., 2013) failed to calculate for the following reasons:   The valid total cholesterol range is 130 to 320 mg/dL   Patient has failed these meds in past: NA Patient is currently controlled on the following medications:  . Pravastatin 80 mg daily   We discussed:  diet and exercise extensively  Plan  Continue current medications  Diabetes   A1c goal <7%  Recent Relevant Labs: Lab Results  Component Value Date/Time   HGBA1C 6.9 (A) 05/12/2020 08:38 AM   HGBA1C 6.7 (A) 10/08/2019 08:24 AM   HGBA1C 6.8 (H) 05/14/2019 12:00 AM   HGBA1C 6.5 01/01/2019 07:48 AM   HGBA1C 6.6 08/30/2018 07:46 AM   HGBA1C 6.6 (A) 08/30/2018 07:46 AM   HGBA1C 6.6 08/30/2018 07:46 AM   MICROALBUR 5.4 05/14/2019 12:00 AM   MICROALBUR 5.7 04/25/2018 09:29 AM   MICROALBUR 20 08/15/2015 08:12 AM    Last diabetic Eye exam:  Lab Results  Component Value Date/Time   HMDIABEYEEXA No Retinopathy 08/18/2020 12:00 AM    Last diabetic Foot exam: No results found for: HMDIABFOOTEX   Checking BG: Daily  Recent FBG Readings: 82, 94, Highest in recall 130  Recent pre-meal BG readings: NA Recent 2hr PP BG readings:  NA Recent HS BG readings: NA  Patient has failed these meds in past: NA Patient is currently controlled on the following medications: . Trulicity 1.5 mg weekly  . Humalog 75-25 50 units AM, 20 units PM   We discussed: diet and exercise extensively. Patient denies recent hypoglycemia. Patient will need her patient assistance renewed for her Trulicity and Humalog for 2022.   Counseled patient on the importance of checking blood sugars prior to administering insulin to minimize risk of hypoglycemia   Plan  Continue  current medications Will start on PAP renewal for 2022 Increase monitoring to twice daily (before breakfast and before evening meal)  Asthma     Eosinophil count:   Lab Results  Component Value Date/Time   EOSPCT 3.3 05/12/2020 08:59 AM   EOSPCT 1.8 11/09/2013 11:37 AM   EOSPCT 4.2 03/16/2007 07:56 AM  %  Eos (Absolute):  Lab Results  Component Value Date/Time   EOSABS 284 05/12/2020 08:59 AM   EOSABS 0.3 04/16/2015 11:38 AM   EOSABS 0.2 11/09/2013 11:37 AM    Tobacco Status:  Social History   Tobacco Use  Smoking Status Never Smoker  Smokeless Tobacco Never Used  Tobacco Comment   n/a    Patient has failed these meds in past: NA Patient is currently controlled on the following medications:   . Albuterol HFA 108 mcg/act 2 puff q6hr PRN  . Advair Diskus 250-50 mcg/dose 1 puff twice daily  . Flonase 50 mcg/act 2 spray daily    Using maintenance inhaler regularly? Yes Frequency of rescue inhaler use:  infrequently  We discussed:  Reports adherence to her maintenance inhaler daily. She also does report some cost concerns with her advair.   Plan  Continue current medications  Will start PAP  Gout   Uric Acid, Serum  Date Value Ref Range Status  04/20/2016 5.2 2.5 - 7.0 mg/dL Final    Comment:    Therapeutic target for gout patients: <6.0 mg/dL   Uric Acid  Date Value Ref Range Status  08/15/2015 5.0 2.5 - 7.1 mg/dL Final    Comment:               Therapeutic target for gout patients: <6.0  04/16/2015 9.2 (H) 2.5 - 7.1 mg/dL Final    Comment:               Therapeutic target for gout patients: <6.0     Goal Uric Acid < 6 mg/dL   Medications that may increase uric acid levels: Loop Diuretics   Last gout flare: >1 year   Patient has failed these meds in past: NA Patient is currently controlled on the following medications:  . Allopurinol 100 mg twice daily  . Colchicine 0.6 mg PRN (Expired)   We discussed:  Counseled  patient on low purine diet plan. Counseled patient to reduce consumption of high-fructose corn syrup, sweetened soft drinks, fruit juices, meat, and seafood. Counseled patient to avoid alcohol consumption.   Plan  Continue current medications   Vaccines   Reviewed and discussed patient's vaccination history.    Immunization History  Administered Date(s) Administered  . Fluad Quad(high Dose 65+) 05/14/2019, 05/12/2020  . Influenza Split 05/16/2007  . Influenza, High Dose Seasonal PF 06/24/2017, 04/25/2018  . Influenza, Seasonal, Injecte, Preservative Fre 05/07/2010, 04/27/2011  . Influenza,inj,Quad PF,6+ Mos 08/02/2014, 04/15/2015, 05/22/2016  . Influenza-Unspecified 08/02/2014  . Moderna Sars-Covid-2 Vaccination 09/28/2019, 10/26/2019, 07/02/2020  . Pneumococcal Conjugate-13 08/02/2014  . Pneumococcal Polysaccharide-23 04/27/2011, 06/04/2016  . Tdap 09/29/2012  . Zoster 09/29/2012    Medication Management   Plan  Continue current medication management strategy  Follow up: 3 month phone visit  [facility] [phone]

## 2020-09-03 NOTE — Patient Instructions (Signed)
Visit Information It was great speaking with you today!  Please let me know if you have any questions about our visit. Goals Addressed            This Visit's Progress   . Chronic Care Management       CARE PLAN ENTRY (see longitudinal plan of care for additional care plan information)  Current Barriers:  . Chronic Disease Management support, education, and care coordination needs related to Hypertension, Hyperlipidemia, Diabetes, Atrial Fibrillation, Asthma, Chronic Kidney Disease, Gout, and Allergic Rhinitis   Hypertension BP Readings from Last 3 Encounters:  06/27/20 (!) 161/59  05/12/20 132/74  02/06/20 135/63   . Pharmacist Clinical Goal(s): o Over the next 90 days, patient will work with PharmD and providers to maintain BP goal <130/80 . Current regimen:  o Diltiazem CR 300mg  daily o Irbesartan 300mg  daily . Interventions: o None . Patient self care activities - Over the next 90 days, patient will: o Check BP weekly, document, and provide at future appointments o Ensure daily salt intake < 2300 mg/day  Hyperlipidemia Lab Results  Component Value Date/Time   LDLCALC 52 05/12/2020 08:59 AM   . Pharmacist Clinical Goal(s): o Over the next 90 days, patient will work with PharmD and providers to maintain LDL goal < 70 . Current regimen:  o Pravastatin 80mg  daily . Interventions: o Take pravastatin at bedtime for maximum effectiveness . Patient self care activities - Over the next 90 days, patient will: o Take pravastatin at bedtime o Maintain positive lifestyle changes  Diabetes Lab Results  Component Value Date/Time   HGBA1C 6.9 (A) 05/12/2020 08:38 AM   HGBA1C 6.7 (A) 10/08/2019 08:24 AM   HGBA1C 6.8 (H) 05/14/2019 12:00 AM   HGBA1C 6.5 01/01/2019 07:48 AM   HGBA1C 6.6 08/30/2018 07:46 AM   HGBA1C 6.6 (A) 08/30/2018 07:46 AM   HGBA1C 6.6 08/30/2018 07:46 AM   . Pharmacist Clinical Goal(s): o Over the next 90 days, patient will work with PharmD and  providers to maintain A1c goal <7% . Current regimen:  o Humalog 75/25 inject 50 units every morning and 20 units every evening o Trulicity inject 1.5mg  weekly . Interventions: o Will start patient assistance for Trulicity and Humalog  . Patient self care activities - Over the next 90 days, patient will: o Check blood sugar once daily, document, and provide at future appointments o Contact provider with any episodes of hypoglycemia  Asthma . Pharmacist Clinical Goal(s) o Over the next 90 days, patient will work with PharmD and providers to improve breathing . Current regimen:  o Advair 250/50 mcg 1 puff by mouth twice daily o Ventolin HFA 2 puffs by mouth as needed . Interventions: o Will start patient assistance for Advair . Patient self care activities - Over the next 90 days, patient will: o Report to PharmD if rescue inhaler usage increases  Medication management . Pharmacist Clinical Goal(s): o Over the next 89 days, patient will work with PharmD and providers to maintain optimal medication adherence . Current pharmacy: [facility] . Interventions o Comprehensive medication review performed. o Continue current medication management strategy . Patient self care activities - Over the next 90 days, patient will: o Focus on medication adherence by timing medications for most comfort and benefit o Take medications as prescribed o Report any questions or concerns to PharmD and/or provider(s)       The patient verbalized understanding of instructions, educational materials, and care plan provided today and declined offer to receive  copy of patient instructions, educational materials, and care plan.   Telephone follow up appointment with pharmacy team member scheduled for: 03/04/21 at 8:30 AM  [facility] [phone]

## 2020-09-21 IMAGING — MG DIGITAL SCREENING BILAT W/ TOMO W/ CAD
8 of 18 series · 8 of 40 positions shown · non-contrast
Comparison: Previous exam(s).

ACR Breast Density Category a: The breast tissue is almost entirely
fatty.

CLINICAL DATA: Screening.

EXAM:
DIGITAL SCREENING BILATERAL MAMMOGRAM WITH TOMO AND CAD

[R CC synth-2D (1 of 2)]
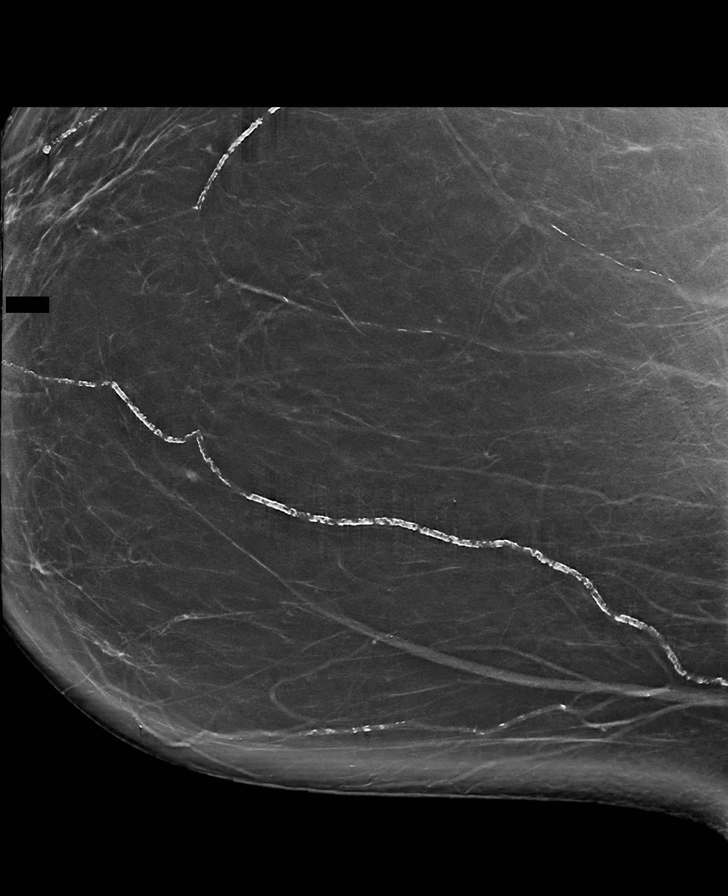

[L MLO synth-2D (1 of 3)]
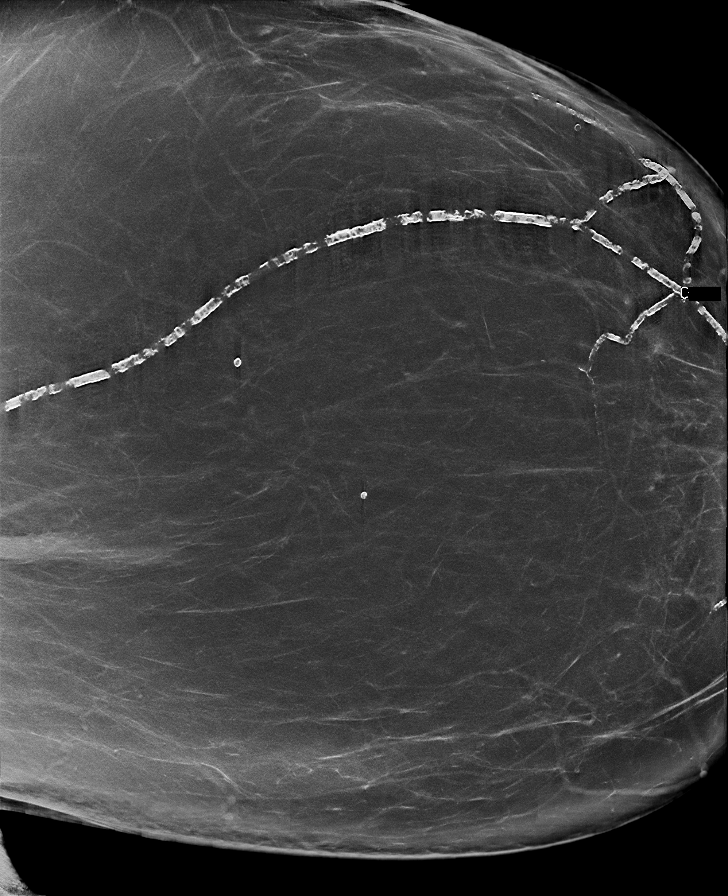

[R CC synth-2D (2 of 2)]
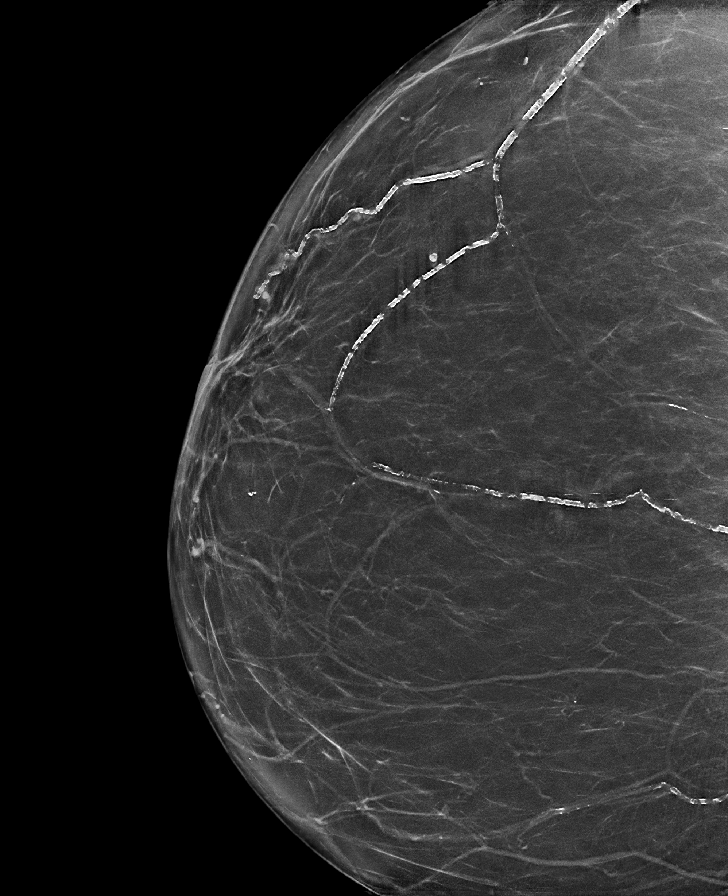

[L MLO synth-2D (2 of 3)]
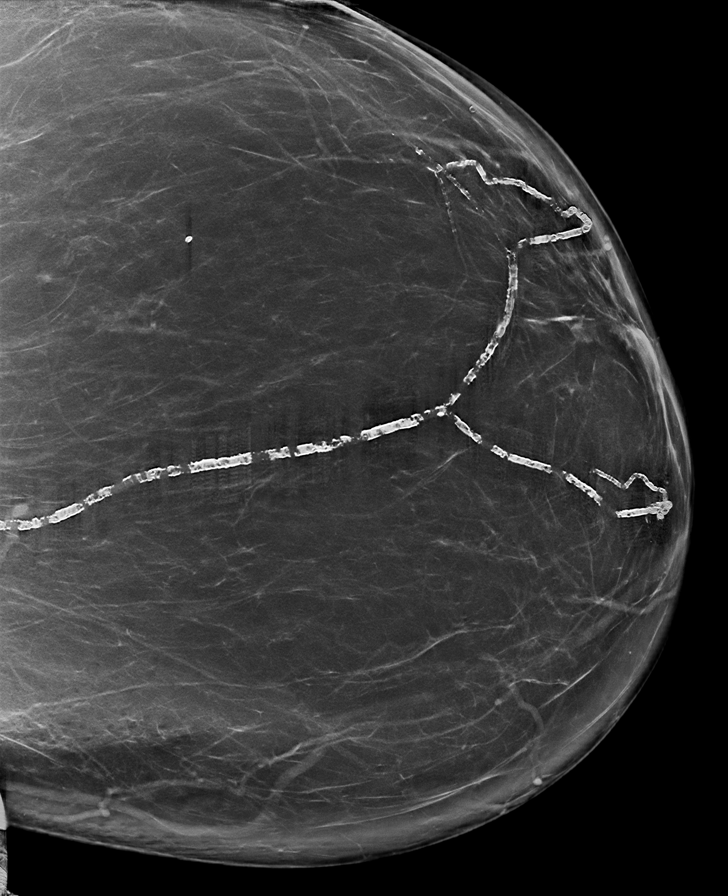

[R MLO synth-2D (1 of 3)]
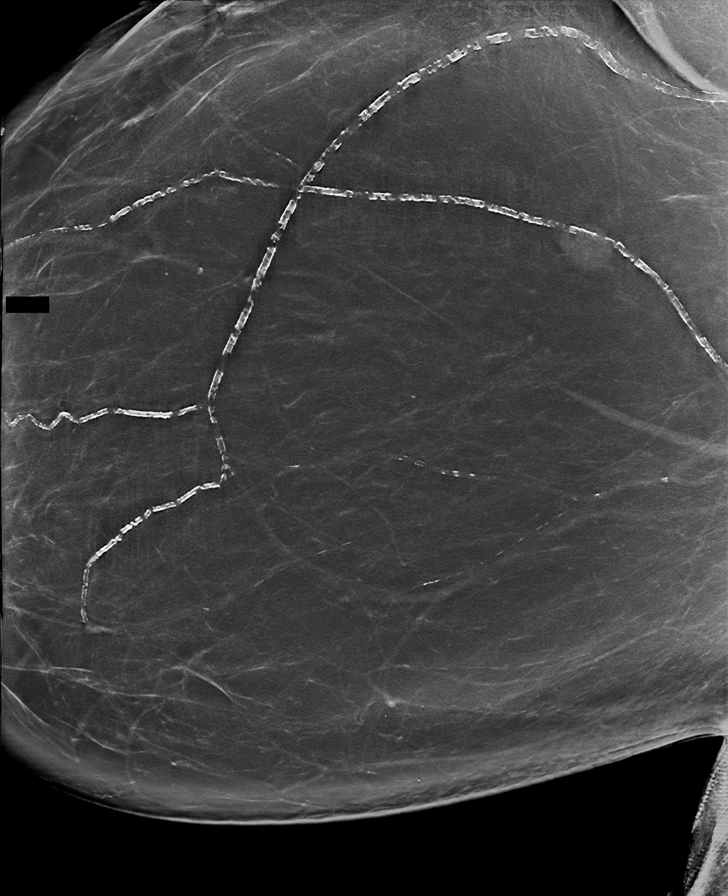

[R MLO synth-2D (2 of 3)]
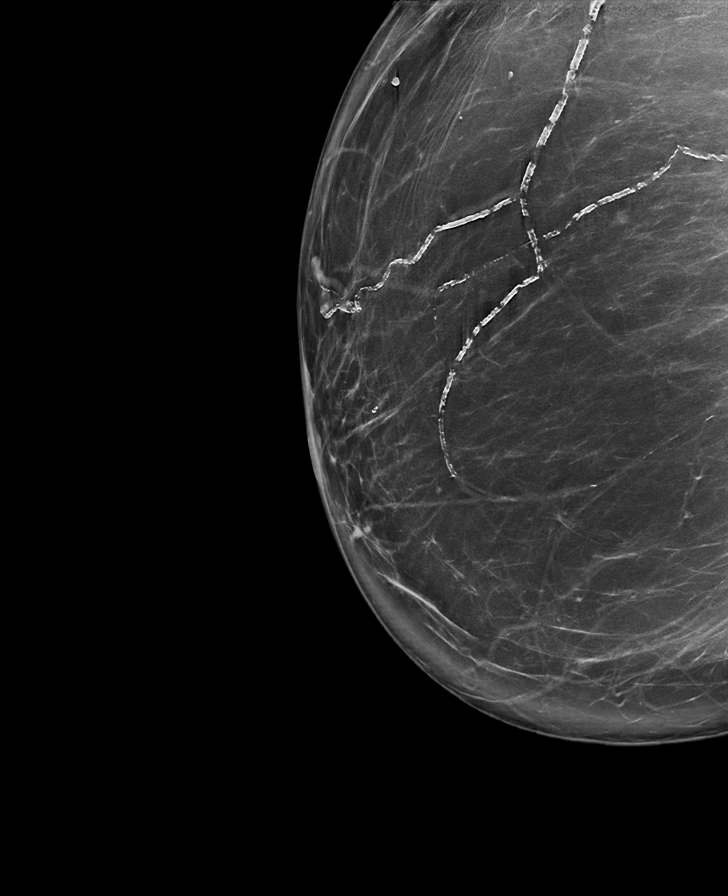

[R MLO synth-2D (3 of 3)]
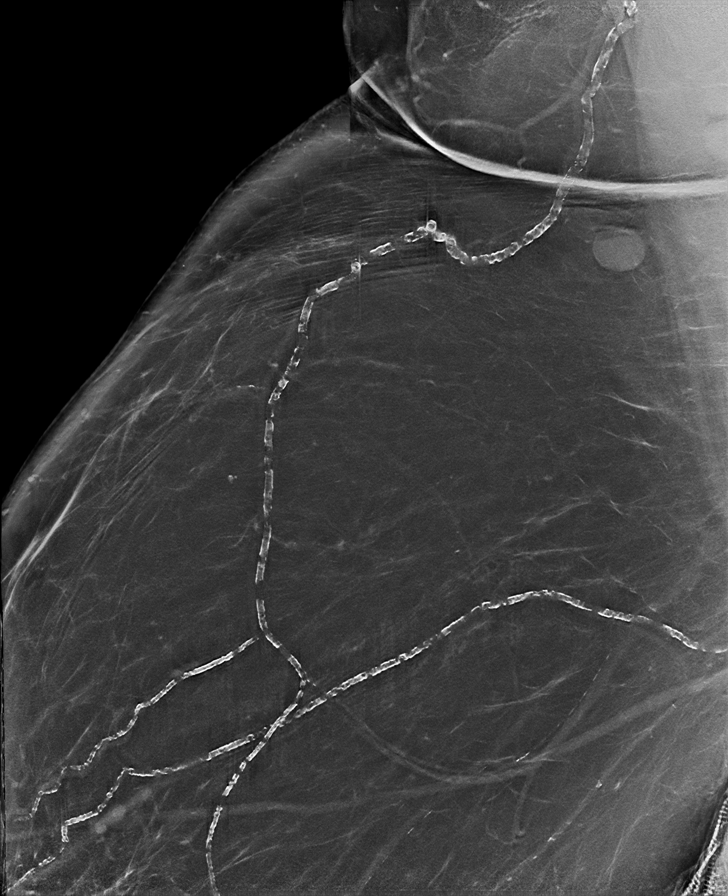

[L MLO synth-2D (3 of 3)]
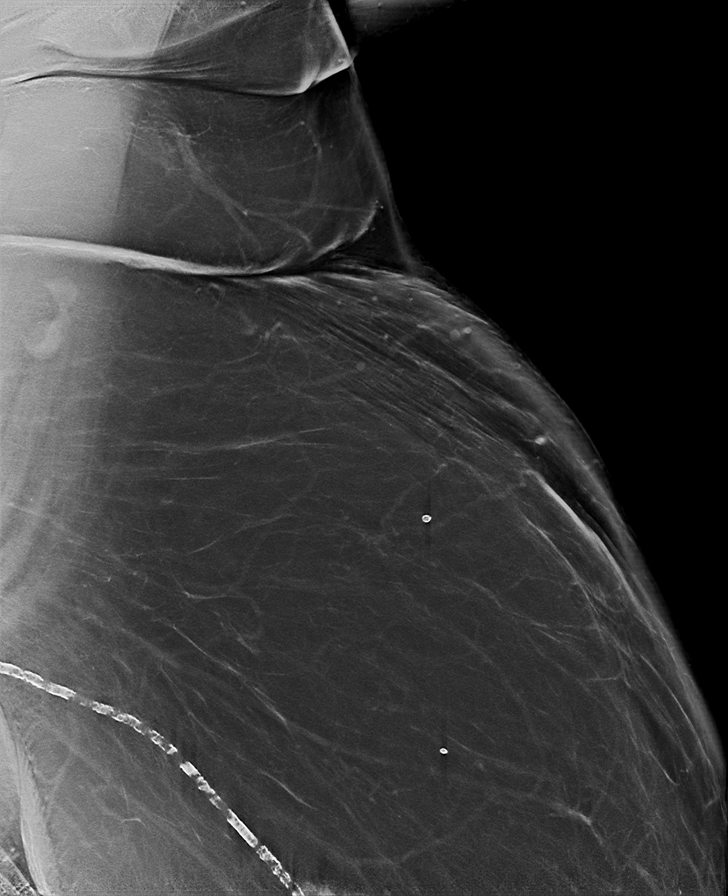

[8 of 40 positions shown; findings below may reference images not displayed]

FINDINGS: There are no findings suspicious for malignancy. Images were
processed with CAD.
IMPRESSION: No mammographic evidence of malignancy. A result letter of this
screening mammogram will be mailed directly to the patient.

RECOMMENDATION:
Screening mammogram in one year. (Code:[id number]-[id number])

BI-RADS CATEGORY  1: Negative.

## 2020-09-23 ENCOUNTER — Ambulatory Visit (INDEPENDENT_AMBULATORY_CARE_PROVIDER_SITE_OTHER): Payer: Medicare PPO

## 2020-09-23 DIAGNOSIS — N183 Chronic kidney disease, stage 3 unspecified: Secondary | ICD-10-CM

## 2020-09-23 DIAGNOSIS — E1122 Type 2 diabetes mellitus with diabetic chronic kidney disease: Secondary | ICD-10-CM

## 2020-09-23 NOTE — Chronic Care Management (AMB) (Signed)
[employer] Patient Assistance form for Trulicity and Humalog 02/77 completed by patient and faxed for review on 09/23/20  [facility] [phone]

## 2020-09-25 ENCOUNTER — Other Ambulatory Visit: Payer: Self-pay

## 2020-09-25 DIAGNOSIS — J4540 Moderate persistent asthma, uncomplicated: Secondary | ICD-10-CM

## 2020-09-25 MED ORDER — FLUTICASONE-SALMETEROL 250-50 MCG/DOSE IN AEPB: 1.0000 | INHALATION_SPRAY | Freq: Two times a day (BID) | RESPIRATORY_TRACT | 1 refills | Status: DC

## 2020-09-27 ENCOUNTER — Other Ambulatory Visit: Payer: Self-pay | Admitting: Family Medicine

## 2020-09-27 DIAGNOSIS — J4540 Moderate persistent asthma, uncomplicated: Secondary | ICD-10-CM

## 2020-09-27 NOTE — Telephone Encounter (Signed)
Refill sent to local pharmacy. Previously sent to mail order pharmacy.

## 2020-10-01 ENCOUNTER — Telehealth: Payer: Self-pay

## 2020-10-01 NOTE — Telephone Encounter (Signed)
Pt wanted to inform you that she has been approved by Patient [place]. [employer] got her Approved for 87yr starting on yesterday. They are going to try to contact Dr Ancil Boozer to ask about the DX for the asthma.

## 2020-10-02 ENCOUNTER — Telehealth: Payer: Self-pay

## 2020-10-02 NOTE — Chronic Care Management (AMB) (Signed)
Chronic Care Management Pharmacy Assistant   Name: Amber Reyes  MRN: [id number] DOB: 10/08/1945  Reason for Encounter: Patient Assistance Coordination  PCP : Amber Sizer, MD  10/02/2020- Patient assistance forms for Eliquis started for patient with [employer] patient assistance foundation. Called patient to inform and set up a time for her to come by the office to sign. Also inquired if she returned the Advair patient assistance forms with [employer] to the office. Patient stated that Advair was approved until with her [facility] assistance program. Patient was informed by an advocate that they are waiting on a signature for prescription with diagnosis and they can ship medication on Tuesday. Amber Reyes, CPP notified.   Allergies:  No Known Allergies  Medications: Outpatient Encounter Medications as of 10/02/2020  Medication Sig   albuterol (PROVENTIL HFA;VENTOLIN HFA) 108 (90 BASE) MCG/ACT inhaler Inhale 2 puffs into the lungs every 6 (six) hours as needed for wheezing or shortness of breath.   allopurinol (ZYLOPRIM) 100 MG tablet TAKE 1 TABLET TWICE DAILY   Blood Glucose Calibration (TRUE METRIX LEVEL 1) Low SOLN 1 each by In Vitro route once a week.   Blood Glucose Monitoring Suppl (TRUE METRIX AIR GLUCOSE METER) w/Device KIT Inject 1 each into the skin 4 (four) times daily as needed. Check fsbs 4 times daily E11.22   Cholecalciferol (VITAMIN D) 2000 UNITS tablet Take 2,000 Units by mouth daily.   colchicine (COLCRYS) 0.6 MG tablet Take 1-2 tablets (0.6-1.2 mg total) by mouth daily as needed. (Patient not taking: Reported on 09/03/2020)   diltiazem (CARDIZEM CD) 300 MG 24 hr capsule TAKE 1 CAPSULE EVERY DAY   DROPLET PEN NEEDLES 30G X 8 MM MISC USE TWICE DAILY WITH INSULIN   Dulaglutide (TRULICITY) 1.5 EY/8.1KG SOPN Inject 1.5 mg into the skin once a week.   Elastic Bandages & Supports (MEDICAL COMPRESSION STOCKINGS) MISC 2 each by Does not apply route daily.    ELIQUIS 5 MG TABS tablet TAKE 1 TABLET TWICE DAILY   fluticasone (FLONASE) 50 MCG/ACT nasal spray USE 2 SPRAYS IN EACH NOSTRIL EVERY DAY   Fluticasone-Salmeterol (ADVAIR) 250-50 MCG/DOSE AEPB INHALE 1 DOSE BY MOUTH TWICE DAILY   furosemide (LASIX) 40 MG tablet TAKE 1 TABLET (40 MG TOTAL) BY MOUTH 2 (TWO) TIMES DAILY AS NEEDED.   Insulin Lispro Prot & Lispro (HUMALOG MIX 75/25 KWIKPEN) (75-25) 100 UNIT/ML Kwikpen Inject 22-50 Units into the skin 2 (two) times daily with a meal. 50 units before breakfast and 22 before dinner   irbesartan (AVAPRO) 300 MG tablet TAKE 1 TABLET EVERY DAY   pravastatin (PRAVACHOL) 80 MG tablet TAKE 1 TABLET (80 MG TOTAL) BY MOUTH EVERY EVENING.   TRUE METRIX BLOOD GLUCOSE TEST test strip CHECK BLOOD SUGAR FOUR TIMES DAILY   No facility-administered encounter medications on file as of 10/02/2020.    Current Diagnosis: Patient Active Problem List   Diagnosis Date Noted   Hypertension associated with stage 3 chronic kidney disease due to type 2 diabetes mellitus ([place]) 02/06/2020   History of colonic polyps    Polyp of colon    Thoracic aortic atherosclerosis ([place]) 12/30/2016   Controlled type 2 diabetes mellitus with stage 3 chronic kidney disease ([place]) 12/23/2016   Venous insufficiency 05/21/2016   Hypertensive heart disease 05/21/2016   Anemia in chronic kidney disease 12/22/2015   Pulmonary hypertension ([place]) 12/18/2015   Xanthelasma 04/15/2015   Anxiety 04/12/2015   Chronic kidney disease (CKD), stage III (moderate) ([place]) 04/12/2015  Edema extremities 04/12/2015   Disc disorder of lumbar region 04/12/2015   Asthma, moderate persistent 04/12/2015   Morbid obesity, unspecified obesity type ([place]) 04/12/2015   Obstructive apnea 04/12/2015   Restless leg 04/12/2015   Allergic rhinitis 04/12/2015   Vitamin D deficiency 04/12/2015   Controlled gout 04/12/2015   Premature atrial contractions 11/29/2013   Atrial fibrillation ([place])  11/09/2013   Benign essential hypertension    Hyperlipidemia     Follow-Up:  Patient Assistance Coordination   10/16/2020- Patient assistance forms for Advair was completed by Amber Reyes but missing information. Amber Reyes, CPP aware, corrected form and faxed. Called patient to make aware, also informed that Eliquis paperwork will be mailed to her but she will need to take to her Cardiology office for Amber Reyes to sign due to him prescribing and monitoring the Eliquis. Patient notified.  Amber Reyes, [place] Pharmacist Assistant [phone]

## 2020-10-02 NOTE — Telephone Encounter (Signed)
Follow up on [facility] request

## 2020-10-02 NOTE — Telephone Encounter (Signed)
Pt notified Copied from [place] [phone]. Topic: General - Other >> Oct 02, 2020  9:10 AM Pawlus, Brayton Layman A wrote: Reason for CRM: PT called in and wanted to speak with someone directly in the office and stated it was time sensitive regarding her prescription for Fluticasone-Salmeterol (ADVAIR) 250-50 MCG/DOSE AEPB. Please advise.

## 2020-10-02 NOTE — Telephone Encounter (Signed)
Pt.notified

## 2020-10-13 ENCOUNTER — Other Ambulatory Visit: Payer: Self-pay | Admitting: Family Medicine

## 2020-10-13 NOTE — Telephone Encounter (Signed)
Requested Prescriptions  Pending Prescriptions Disp Refills  . TRUE METRIX BLOOD GLUCOSE TEST test strip [Pharmacy Med Name: TRUE METRIX SELF MONITORING BLOOD GLUCOSE STRIPS   Strip] 400 strip 0    Sig: CHECK BLOOD SUGAR FOUR TIMES DAILY     Endocrinology: Diabetes - Testing Supplies Passed - 10/13/2020  1:51 PM      Passed - Valid encounter within last 12 months    Recent Outpatient Visits          5 months ago Controlled type 2 diabetes mellitus with microalbuminuria, with long-term current use of insulin [facility])   [facility] [place], Drue Stager, MD   8 months ago Controlled type 2 diabetes mellitus with microalbuminuria, with long-term current use of insulin [facility])   [facility] [place], Drue Stager, MD   1 year ago Controlled type 2 diabetes mellitus with microalbuminuria, with long-term current use of insulin [facility])   [facility] [place], Drue Stager, MD   1 year ago Controlled type 2 diabetes mellitus with microalbuminuria, with long-term current use of insulin [facility])   [facility] [place], Drue Stager, MD   1 year ago Controlled type 2 diabetes mellitus with microalbuminuria, with long-term current use of insulin [facility])   [facility] Steele Sizer, MD      Future Appointments            In 1 month Ancil Boozer, Drue Stager, MD [facility], [place]   In 1 month Arida, Mertie Clause, MD [facility] [place]   In 8 months  [facility], [facility]

## 2020-10-15 ENCOUNTER — Ambulatory Visit: Payer: Self-pay

## 2020-10-15 ENCOUNTER — Other Ambulatory Visit: Payer: Self-pay | Admitting: Family Medicine

## 2020-10-15 DIAGNOSIS — Z794 Long term (current) use of insulin: Secondary | ICD-10-CM

## 2020-10-15 DIAGNOSIS — E1122 Type 2 diabetes mellitus with diabetic chronic kidney disease: Secondary | ICD-10-CM | POA: Diagnosis not present

## 2020-10-15 DIAGNOSIS — N183 Chronic kidney disease, stage 3 unspecified: Secondary | ICD-10-CM

## 2020-10-15 DIAGNOSIS — Z1231 Encounter for screening mammogram for malignant neoplasm of breast: Secondary | ICD-10-CM

## 2020-10-15 NOTE — Telephone Encounter (Signed)
Patient called and says she has questions about how much insulin should she be taking, should she be on a sliding scale for the insulin dosages? She says on Sunday early morning around 0522, she woke up feeling hot, sweaty, blurred vision, head feeling woozy. Her blood sugar was 87, she drank OJ and at 0556 blood sugar 123. She went back to sleep. She says she woke up at 1108 still with her head feeling foggy and the blood sugar was 215, she ate breakfast, took her 50 units insulin. At 1218, blood sugar 341, she took her weekly dose of Trulicity. Blood sugar 120 at 1550, blood sugar 90 at 1730-she says she did not take her night insulin of 20 units. On Monday at 0730-blood sugar 101-did not take insulin, she felt lightheaded. At 1638 blood sugar 120, 1838-146, 2130-130 (took 10 units insulin). Symptoms resolved after blood sugar went up. On Tuesday at 0850-90 (no insulin taken). Symptoms-blurry vision, fuzzy head. Ate bowl cereal for breakfast, lunch cheese sandwich/small gatorade, dinner-chicken pot pie from [facility]. At 1235 blood sugar 100, BP 151/63, P 63; reports head still foggy, but better. At [place] blood sugar 113, 2025-112(did not take night dose insulin). Today at 0935 blood sugar 127 BP 122/63, P 63; 1050-232 (took 25 units insulin), no symptoms at this time. Today she ate breakfast 1 bowl cereal and 1 boiled egg. She's asking how much more insulin should she take today. I advised I will send this to the office and someone will call with Dr. Ancil Boozer recommendation, she verbalized understanding.  Reason for Disposition . [1] Caller has NON-URGENT medicine question about med that PCP prescribed AND [2] triager unable to answer question  Answer Assessment - Initial Assessment Questions 1. NAME of MEDICATION: "What medicine are you calling about?"     Novolin insulin  2. QUESTION: "What is your question?" (e.g., medication refill, side effect)     How much do I need to be taking, do I need a sliding  scale? 3. PRESCRIBING HCP: "Who prescribed it?" Reason: if prescribed by specialist, call should be referred to that group.     Dr. Ancil Boozer 4. SYMPTOMS: "Do you have any symptoms?"     Yes, when the blood sugar was lower, but not now 5. SEVERITY: If symptoms are present, ask "Are they mild, moderate or severe?"     Mild symptoms 6. PREGNANCY:  "Is there any chance that you are pregnant?" "When was your last menstrual period?"     No  Protocols used: MEDICATION QUESTION CALL-A-AH

## 2020-10-15 NOTE — Progress Notes (Addendum)
Spoke with patient regarding recent blood sugar readings. Patient reports she feels like she doesn't have as much appetite as she used to. Her normal diabetes regimen is:  Trulicity 1.5 mg weekly  Humalog 75/25 50 units before breakfast, 20 units before dinner  Patient has been self-adjusting her insulin dose based on her readings.   Recommended the follwing plan to the patient after consultation with Dr. Ancil Boozer:  -Continue Trulicity 1.5 mg weekly  -Decrease Humalog to 40 units before breakfast, 18 units before supper.  -Instructed patient that if she is having a small or low-carbohydarte meal, she should take take 20 units if Humalog before breakfast and 10 units of Humalog before dinner. -Counseled patient to be sure to inject her insulin 0-15 minutes BEFORE eating to minimize risk of hypoglycemia   Instructed patient to check blood sugars before breakfast, before dinner and whenever feeling symptoms of hypoglycemia. Scheduled patient for CPP follow-up on 10/29/2020 at 10:30 AM.   Patient verbalized her understanding and agreement of the above plan, and was able to repeat it back to me correctly.   [facility] [phone]

## 2020-10-21 ENCOUNTER — Telehealth: Payer: Self-pay | Admitting: Cardiovascular Disease

## 2020-10-21 NOTE — Telephone Encounter (Signed)
Originals returned to patient

## 2020-10-21 NOTE — Telephone Encounter (Signed)
Patient had volunteer drop off Amber Reyes paperwork  Placed in nurse box

## 2020-10-22 NOTE — Telephone Encounter (Signed)
Placed on Dr. Arida's desk to be signed. 

## 2020-10-27 NOTE — Telephone Encounter (Signed)
Patient assistance application and documentation faxed to [place]. Fax confirmation received.

## 2020-10-28 ENCOUNTER — Telehealth: Payer: Self-pay

## 2020-10-28 NOTE — Progress Notes (Signed)
Spoke to patient to confirmed patient telephone appointment on 10/29/2020 for CCM at 10:30 am with [name] the Clinical pharmacist.   Patient Verbalized understanding, and denies any side effects from current medications.  [place] Pharmacist Assistant [phone]

## 2020-10-29 ENCOUNTER — Ambulatory Visit (INDEPENDENT_AMBULATORY_CARE_PROVIDER_SITE_OTHER): Payer: Medicare PPO

## 2020-10-29 ENCOUNTER — Telehealth: Payer: Self-pay

## 2020-10-29 DIAGNOSIS — E785 Hyperlipidemia, unspecified: Secondary | ICD-10-CM

## 2020-10-29 DIAGNOSIS — Z794 Long term (current) use of insulin: Secondary | ICD-10-CM | POA: Diagnosis not present

## 2020-10-29 DIAGNOSIS — E1159 Type 2 diabetes mellitus with other circulatory complications: Secondary | ICD-10-CM | POA: Diagnosis not present

## 2020-10-29 DIAGNOSIS — E1169 Type 2 diabetes mellitus with other specified complication: Secondary | ICD-10-CM | POA: Diagnosis not present

## 2020-10-29 DIAGNOSIS — N183 Chronic kidney disease, stage 3 unspecified: Secondary | ICD-10-CM | POA: Diagnosis not present

## 2020-10-29 DIAGNOSIS — E1122 Type 2 diabetes mellitus with diabetic chronic kidney disease: Secondary | ICD-10-CM | POA: Diagnosis not present

## 2020-10-29 DIAGNOSIS — I152 Hypertension secondary to endocrine disorders: Secondary | ICD-10-CM | POA: Diagnosis not present

## 2020-10-29 NOTE — Progress Notes (Signed)
° ° °  Chronic Care Management °Pharmacy Assistant  ° °Name: Najai L Urias  MRN: [id number] DOB: 10/03/1945 ° ° °Reason for Encounter: Medication Review /Patient assistance for Trulicity/Humalog  °  ° °Medications: °Outpatient Encounter Medications as of 10/29/2020  °Medication Sig Note  °• albuterol (PROVENTIL HFA;VENTOLIN HFA) 108 (90 BASE) MCG/ACT inhaler Inhale 2 puffs into the lungs every 6 (six) hours as needed for wheezing or shortness of breath.   °• allopurinol (ZYLOPRIM) 100 MG tablet TAKE 1 TABLET TWICE DAILY   °• Blood Glucose Calibration (TRUE METRIX LEVEL 1) Low SOLN 1 each by In Vitro route once a week.   °• Blood Glucose Monitoring Suppl (TRUE METRIX AIR GLUCOSE METER) w/Device KIT Inject 1 each into the skin 4 (four) times daily as needed. Check fsbs 4 times daily E11.22   °• Cholecalciferol (VITAMIN D) 2000 UNITS tablet Take 2,000 Units by mouth daily.   °• colchicine (COLCRYS) 0.6 MG tablet Take 1-2 tablets (0.6-1.2 mg total) by mouth daily as needed. (Patient not taking: Reported on 09/03/2020)   °• diltiazem (CARDIZEM CD) 300 MG 24 hr capsule TAKE 1 CAPSULE EVERY DAY   °• DROPLET PEN NEEDLES 30G X 8 MM MISC USE TWICE DAILY WITH INSULIN   °• Dulaglutide (TRULICITY) 1.5 MG/0.5ML SOPN Inject 1.5 mg into the skin once a week.   °• Elastic Bandages & Supports (MEDICAL COMPRESSION STOCKINGS) MISC 2 each by Does not apply route daily.   °• ELIQUIS 5 MG TABS tablet TAKE 1 TABLET TWICE DAILY   °• fluticasone (FLONASE) 50 MCG/ACT nasal spray USE 2 SPRAYS IN EACH NOSTRIL EVERY DAY   °• Fluticasone-Salmeterol (ADVAIR) 250-50 MCG/DOSE AEPB INHALE 1 DOSE BY MOUTH TWICE DAILY   °• furosemide (LASIX) 40 MG tablet TAKE 1 TABLET (40 MG TOTAL) BY MOUTH 2 (TWO) TIMES DAILY AS NEEDED.   °• Insulin Lispro Prot & Lispro (HUMALOG MIX 75/25 KWIKPEN) (75-25) 100 UNIT/ML Kwikpen Inject 22-50 Units into the skin 2 (two) times daily with a meal. 50 units before breakfast and 22 before dinner 10/29/2020: Decreased to 10 units  before breakfast and 10 units before dinner on 10/29/2020  °• irbesartan (AVAPRO) 300 MG tablet TAKE 1 TABLET EVERY DAY   °• pravastatin (PRAVACHOL) 80 MG tablet TAKE 1 TABLET (80 MG TOTAL) BY MOUTH EVERY EVENING.   °• TRUE METRIX BLOOD GLUCOSE TEST test strip CHECK BLOOD SUGAR FOUR TIMES DAILY   ° °No facility-administered encounter medications on file as of 10/29/2020.  ° °Spoke with [employer] for a update on patient application for Trulicity and Humalog. ° °Per [employer] patient was approved for Trulicity on October 24 2020 through Jul 26 2021. Per [employer] ,the application for Humalog needs to be fix on page 5 with the max dose,and needs a sliding scale for patient. Notified Clinical Pharmacist.  ° °Bessie Kellihan,CPA °Clinical Pharmacist Assistant °[phone]  ° °

## 2020-10-29 NOTE — Patient Instructions (Signed)
Visit Information It was great speaking with you today!  Please let me know if you have any questions about our visit.  Goals Addressed            This Visit's Progress   . Monitor and Manage My Blood Sugar-Diabetes Type 2       Timeframe:  Long-Range Goal Priority:  High Start Date:  10/29/2020                           Expected End Date:  05/01/2021                     Follow Up Date 12/17/2020    -check blood sugar three times daily: Before breakfast, before supper, and at bedtime - check blood sugar if I feel it is too high or too low - enter blood sugar readings and medication or insulin into daily log    Why is this important?    Checking your blood sugar at home helps to keep it from getting very high or very low.   Writing the results in a diary or log helps the doctor know how to care for you.   Your blood sugar log should have the time, date and the results.   Also, write down the amount of insulin or other medicine that you take.   Other information, like what you ate, exercise done and how you were feeling, will also be helpful.     Notes:        Patient Care Plan: General Pharmacy (Adult)    Problem Identified: Hypertension, Hyperlipidemia, Diabetes, Atrial Fibrillation, Asthma, Chronic Kidney Disease and Gout   Priority: High    Long-Range Goal: Patient-Specific Goal   Start Date: 10/29/2020  Expected End Date: 05/01/2021  This Visit's Progress: On track  Priority: High  Note:   Current Barriers:  . Unable to independently afford treatment regimen . Suboptimal therapeutic regimen for diabetes  Pharmacist Clinical Goal(s):  Marland Kitchen Over the next 90 days, patient will maintain control of diabetes as evidenced by A1c less than 8%  through collaboration with PharmD and provider.   Interventions: . 1:1 collaboration with Steele Sizer, MD regarding development and update of comprehensive plan of care as evidenced by provider attestation and  co-signature . Inter-disciplinary care team collaboration (see longitudinal plan of care) . Comprehensive medication review performed; medication list updated in electronic medical record  Hypertension (BP goal <140/90) -Controlled -Current treatment: . Diltiazem CD 300 mg daily  . Furosemide 40 mg twice daily as needed (uses every other day)  . Irbersartan 300 mg daily  -Medications previously tried: NA  -Current home readings: NA -Current dietary habits: Since starting Trulicity, patient's diet has been much lessened. She has been eating less, trying to follow a low-carb, low-sodium diet.  -Current exercise habits: Patient is not follow any specific exercise regimen. -Denies hypotensive/hypertensive symptoms -Educated on Daily salt intake goal < 2300 mg; Exercise goal of 150 minutes per week; -Counseled to monitor BP at home weekly, document, and provide log at future appointments -Recommended to continue current medication  Hyperlipidemia: (LDL goal < 70) -Controlled -Current treatment: . Pravastatin 80 mg daily  -Medications previously tried: NA  -Educated on Importance of limiting foods high in cholesterol; -Recommended to continue current medication  Diabetes (A1c goal <8%) -Controlled -Current medications: . Trulicity 1.5 mg weekly  . Humalog 75-25 20 units before breakfast, 10 units before supper  -Medications  previously tried: NA  - Patient found that her previous doses of insulin were causing her to have significant lows given her appetite changes, so she has been giving reduced insulin amounts as discussed with CPP or skipping her insulin entirely. -Current home glucose readings: Blood sugar (units of insulin given)  Fasting Pre-Dinner Bedtime  9-Mar 126 (20)    8-Mar 119 (20) 108 (10)   7-Mar 145 (20) 138 (18)   6-Mar 134 (40) 278 (0) 122  5-Mar 123 (20) 120 (10)   4-Mar 90 (20) 120 (10)   3-Mar 102 (20) 53 (0)   1-Mar 123 (40) 73 (0)   28-Feb 111 (20) 92 (0)  109  27-Feb 126 (20) 103 (0)   26-Feb 107 (20) 103 (0)   25-Feb 99 (20) 118 (0)   24-Feb 106 (20) 103 (18) 90       Average 116 117 107   -Reports hypoglycemic symptoms, which she treated with fasting acting carbs or fast food one time. -Educated on A1c and blood sugar goals; Prevention and management of hypoglycemic episodes; -Counseled to check feet daily and get yearly eye exams -Recommended decreasing Humalog 75/25 to 10 units before breakfast, 10 units before supper Recommend starting Farxiga 10 mg daily at next follow-up for CKD protection  Atrial Fibrillation (Goal: prevent stroke and major bleeding) -Controlled -CHADSVASC: 5 -Current treatment: . Rate control: Diltiazem CD 300 mg daily  . Anticoagulation: Eliquis  -Medications previously tried: NA - Eliquis PAP submitted by Dr. Tyrell Antonio office on 10/27/20. -Home BP and HR readings: NA  -Counseled on avoidance of NSAIDs due to increased bleeding risk with anticoagulants; -Recommended to continue current medication  Asthma (Goal: control symptoms and prevent exacerbations) -Controlled -Current treatment  . Proventil HFA 2 puffs every 6 hours as needed . Advair 1 puff twice daily  -Medications previously tried: NA  -Exacerbations requiring treatment in last 6 months: None -Patient reports consistent use of maintenance inhaler -Frequency of rescue inhaler use: NA -Counseled on When to use rescue inhaler -Recommended to continue current medication   Patient Goals/Self-Care Activities . Over the next 90 days, patient will:  - check glucose 3 times daily, before breakfast, supper, and bedtime, document, and provide at future appointments check blood pressure weekly, document, and provide at future appointments  Follow Up Plan: Telephone follow up appointment with care management team member scheduled for: 12/17/2020 at 9:30 AM    Patient agreed to services and verbal consent obtained.   The patient verbalized  understanding of instructions, educational materials, and care plan provided today and declined offer to receive copy of patient instructions, educational materials, and care plan.   [facility] [phone]

## 2020-10-29 NOTE — Progress Notes (Signed)
Chronic Care Management Pharmacy Note  10/29/2020 Name:  Amber Reyes MRN:  [id number] DOB:  Jul 09, 1946  Subjective: Amber Reyes is an 75 y.o. year old female who is a primary patient of Steele Sizer, MD.  The CCM team was consulted for assistance with disease management and care coordination needs.    Engaged with patient by telephone for follow up visit in response to provider referral for pharmacy case management and/or care coordination services.   Consent to Services:  The patient was given information about Chronic Care Management services, agreed to services, and gave verbal consent prior to initiation of services.  Please see initial visit note for detailed documentation.   Patient Care Team: Steele Sizer, MD as PCP - General (Family Medicine) Wellington Hampshire, MD as PCP - Cardiology (Cardiology) Wellington Hampshire, MD as Consulting Physician (Cardiology) Mottl, Andrez Grime, MD as Referring Physician (Nephrology) Paulla Dolly Tamala Fothergill, DPM as Consulting Physician (Podiatry) Germaine Pomfret, [facility] as Pharmacist (Pharmacist)  Recent office visits: 06/19/20: Patient presented to Clemetine Marker, LPN for AWV.  8/41/66: Patient presented to Dr. Ancil Boozer for follow-up. A1c stable at 6.9%. No medication changes made.  Recent consult visits: None in previous 6 months  Hospital visits: None in previous 6 months  Objective:  Lab Results  Component Value Date   CREATININE 1.29 (H) 05/12/2020   BUN 28 (H) 05/12/2020   GFRNONAA 41 (L) 05/12/2020   GFRAA 48 (L) 05/12/2020   NA 138 05/12/2020   K 4.2 05/12/2020   CALCIUM 9.0 05/12/2020   CO2 29 05/12/2020    Lab Results  Component Value Date/Time   HGBA1C 6.9 (A) 05/12/2020 08:38 AM   HGBA1C 6.7 (A) 10/08/2019 08:24 AM   HGBA1C 6.8 (H) 05/14/2019 12:00 AM   HGBA1C 6.5 01/01/2019 07:48 AM   HGBA1C 6.6 08/30/2018 07:46 AM   HGBA1C 6.6 (A) 08/30/2018 07:46 AM   HGBA1C 6.6 08/30/2018 07:46 AM   MICROALBUR 5.4 05/14/2019  12:00 AM   MICROALBUR 5.7 04/25/2018 09:29 AM   MICROALBUR 20 08/15/2015 08:12 AM    Last diabetic Eye exam:  Lab Results  Component Value Date/Time   HMDIABEYEEXA No Retinopathy 08/18/2020 12:00 AM    Last diabetic Foot exam: No results found for: HMDIABFOOTEX   Lab Results  Component Value Date   CHOL 115 05/12/2020   HDL 47 (L) 05/12/2020   LDLCALC 52 05/12/2020   TRIG 82 05/12/2020   CHOLHDL 2.4 05/12/2020    Hepatic Function Latest Ref Rng & Units 05/12/2020 05/14/2019 11/25/2017  Total Protein 6.1 - 8.1 g/dL 6.8 6.6 6.4  Albumin 3.6 - 5.1 g/dL - - -  AST 10 - 35 U/L '15 11 13  ' ALT 6 - 29 U/L 8 5(L) 7  Alk Phosphatase 33 - 130 U/L - - -  Total Bilirubin 0.2 - 1.2 mg/dL 0.5 0.5 0.3  Bilirubin, Direct 0.0 - 0.2 mg/dL - - 0.0    No results found for: TSH, FREET4  CBC Latest Ref Rng & Units 05/12/2020 05/14/2019 08/04/2018  WBC 3.8 - 10.8 Thousand/uL 8.6 9.3 8.8  Hemoglobin 11.7 - 15.5 g/dL 10.3(L) 10.2(L) 10.2(L)  Hematocrit 35.0 - 45.0 % 31.7(L) 31.9(L) 30.3(L)  Platelets 140 - 400 Thousand/uL 229 227 243    Lab Results  Component Value Date/Time   VD25OH 45 05/12/2020 08:59 AM   VD25OH 48 05/14/2019 12:00 AM    Clinical ASCVD: Yes  The ASCVD Risk score Mikey Bussing DC Jr., et al., 2013) failed to  calculate for the following reasons:   The valid total cholesterol range is 130 to 320 mg/dL    Depression screen [facility] 2/9 06/19/2020 05/12/2020 02/06/2020  Decreased Interest 0 0 0  Down, Depressed, Hopeless 0 0 0  PHQ - 2 Score 0 0 0  Altered sleeping - 0 0  Tired, decreased energy - 2 0  Change in appetite - 0 0  Feeling bad or failure about yourself  - 0 0  Trouble concentrating - 0 0  Moving slowly or fidgety/restless - 0 0  Suicidal thoughts - 0 0  PHQ-9 Score - 2 0  Difficult doing work/chores - Not difficult at all Not difficult at all  Some recent data might be hidden    Social History   Tobacco Use  Smoking Status Never Smoker  Smokeless Tobacco Never Used   Tobacco Comment   n/a   BP Readings from Last 3 Encounters:  06/27/20 (!) 161/59  05/12/20 132/74  02/06/20 135/63   Pulse Readings from Last 3 Encounters:  06/27/20 65  05/12/20 71  11/15/19 71   Wt Readings from Last 3 Encounters:  06/27/20 (!) 383 lb (173.7 kg)  05/12/20 (!) 387 lb 11.2 oz (175.9 kg)  11/15/19 (!) 393 lb (178.3 kg)    Assessment/Interventions: Review of patient past medical history, allergies, medications, health status, including review of consultants reports, laboratory and other test data, was performed as part of comprehensive evaluation and provision of chronic care management services.   SDOH:  (Social Determinants of Health) assessments and interventions performed: Yes SDOH Interventions   Flowsheet Row Most Recent Value  SDOH Interventions   Financial Strain Interventions Other (Comment)  [PAP]      CCM Care Plan  No Known Allergies  Medications Reviewed Today    Reviewed by Germaine Pomfret, [place] (Pharmacist) on 10/29/20 at 1126  Med List Status: <None>  Medication Order Taking? Sig Documenting Provider Last Dose Status Informant  albuterol (PROVENTIL HFA;VENTOLIN HFA) 108 (90 BASE) MCG/ACT inhaler [id number]  Inhale 2 puffs into the lungs every 6 (six) hours as needed for wheezing or shortness of breath. [provider]  Active Self  allopurinol (ZYLOPRIM) 100 MG tablet [id number]  TAKE 1 TABLET TWICE DAILY Sowles, Drue Stager, MD  Active   Blood Glucose Calibration (TRUE METRIX LEVEL 1) Low SOLN [id number]  1 each by In Vitro route once a week. Steele Sizer, MD  Active   Blood Glucose Monitoring Suppl (TRUE METRIX AIR GLUCOSE METER) w/Device KIT [id number]  Inject 1 each into the skin 4 (four) times daily as needed. Check fsbs 4 times daily E11.22 Steele Sizer, MD  Active   Cholecalciferol (VITAMIN D) 2000 UNITS tablet [id number]  Take 2,000 Units by mouth daily. [provider]  Active Self  colchicine (COLCRYS) 0.6 MG tablet  [id number]  Take 1-2 tablets (0.6-1.2 mg total) by mouth daily as needed.  Patient not taking: Reported on 09/03/2020   Steele Sizer, MD  Active   diltiazem (CARDIZEM CD) 300 MG 24 hr capsule [id number]  TAKE 1 CAPSULE EVERY DAY Sowles, Drue Stager, MD  Active   DROPLET PEN NEEDLES 30G X 8 MM MISC [id number]  USE TWICE DAILY WITH INSULIN Ancil Boozer, Drue Stager, MD  Active   Dulaglutide (TRULICITY) 1.5 QZ/0.0PQ SOPN [id number]  Inject 1.5 mg into the skin once a week. Steele Sizer, MD  Active   Elastic Bandages & Supports ([place]) [place] [id number]  2 each by Does not apply route daily. Steele Sizer, MD  Active Self  ELIQUIS 5 MG TABS tablet [id number]  TAKE 1 TABLET TWICE DAILY Wellington Hampshire, MD  Active   fluticasone (FLONASE) 50 MCG/ACT nasal spray [id number]  USE 2 SPRAYS IN EACH NOSTRIL EVERY Eloise Harman, MD  Active   Fluticasone-Salmeterol (ADVAIR) 250-50 MCG/DOSE AEPB [id number]  INHALE 1 DOSE BY MOUTH TWICE DAILY Ancil Boozer, Drue Stager, MD  Active   furosemide (LASIX) 40 MG tablet [id number]  TAKE 1 TABLET (40 MG TOTAL) BY MOUTH 2 (TWO) TIMES DAILY AS NEEDED. Steele Sizer, MD  Active   Insulin Lispro Prot & Lispro (HUMALOG MIX 75/25 KWIKPEN) (75-25) 100 UNIT/ML Claiborne Rigg [id number] Yes Inject 22-50 Units into the skin 2 (two) times daily with a meal. 50 units before breakfast and 22 before dinner Steele Sizer, MD Taking Active            Med Note ([place] A   Wed Oct 29, 2020 11:25 AM) Decreased to 10 units before breakfast and 10 units before dinner on 10/29/2020  irbesartan (AVAPRO) 300 MG tablet [id number]  TAKE 1 TABLET EVERY DAY Steele Sizer, MD  Active   pravastatin (PRAVACHOL) 80 MG tablet [id number]  TAKE 1 TABLET (80 MG TOTAL) BY MOUTH EVERY EVENING. Steele Sizer, MD  Active   TRUE METRIX BLOOD GLUCOSE TEST test strip [id number]  CHECK BLOOD SUGAR FOUR TIMES DAILY Steele Sizer, MD  Active           Patient Active Problem List   Diagnosis Date Noted   . 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 History of colonic polyps   . Polyp of colon   . Thoracic aortic atherosclerosis ([place]) 12/30/2016  . Controlled type 2 diabetes mellitus with stage 3 chronic kidney disease ([place]) 12/23/2016  . Venous insufficiency 05/21/2016  . Hypertensive heart disease 05/21/2016  . Anemia in chronic kidney disease 12/22/2015  . Pulmonary hypertension ([place]) 12/18/2015  . 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 Anxiety 04/12/2015  . Chronic kidney disease (CKD), stage III (moderate) ([place]) 04/12/2015  . Edema extremities 04/12/2015  . 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 Asthma, moderate persistent 04/12/2015  . Morbid obesity, unspecified obesity type ([place]) 04/12/2015  . Obstructive apnea 04/12/2015  . Restless leg 04/12/2015  . Allergic rhinitis 04/12/2015  . Vitamin D deficiency 04/12/2015  . Controlled gout 04/12/2015  . Premature atrial contractions 11/29/2013  . Atrial fibrillation ([place]) 11/09/2013  . Benign essential hypertension   . Hyperlipidemia     Immunization History  Administered Date(s) Administered  . Fluad Quad(high Dose 65+) 05/14/2019, 05/12/2020  . Influenza Split 05/16/2007  . Influenza, High Dose Seasonal PF 06/24/2017, 04/25/2018  . Influenza, Seasonal, Injecte, Preservative Fre 05/07/2010, 04/27/2011  . Influenza,inj,Quad PF,6+ Mos 08/02/2014, 04/15/2015, 05/22/2016  . Influenza-Unspecified 08/02/2014  . Moderna Sars-Covid-2 Vaccination 09/28/2019, 10/26/2019, 07/02/2020  . Pneumococcal Conjugate-13 08/02/2014  . Pneumococcal Polysaccharide-23 04/27/2011, 06/04/2016  . Tdap 09/29/2012  . Zoster 09/29/2012    Conditions to be addressed/monitored:  Hypertension, Hyperlipidemia, Diabetes, Atrial Fibrillation, Asthma, Chronic Kidney Disease and Gout  Care Plan : General Pharmacy (Adult)  Updates made by Germaine Pomfret, RPH since 10/29/2020 12:00 AM     Problem: Hypertension, Hyperlipidemia, Diabetes, Atrial Fibrillation, Asthma, Chronic Kidney Disease and Gout   Priority: High    Long-Range Goal: Patient-Specific Goal   Start Date: 10/29/2020  Expected End Date: 05/01/2021  This Visit's Progress: On track  Priority: High  Note:   Current Barriers:  .  Unable to independently afford treatment regimen . Suboptimal therapeutic regimen for diabetes  Pharmacist Clinical Goal(s):  Marland Kitchen Over the next 90 days, patient will maintain control of diabetes as evidenced by A1c less than 8%  through collaboration with PharmD and provider.   Interventions: . 1:1 collaboration with Steele Sizer, MD regarding development and update of comprehensive plan of care as evidenced by provider attestation and co-signature . Inter-disciplinary care team collaboration (see longitudinal plan of care) . Comprehensive medication review performed; medication list updated in electronic medical record  Hypertension (BP goal <140/90) -Controlled -Current treatment: . Diltiazem CD 300 mg daily  . Furosemide 40 mg twice daily as needed (uses every other day)  . Irbersartan 300 mg daily  -Medications previously tried: NA  -Current home readings: NA -Current dietary habits: Since starting Trulicity, patient's diet has been much lessened. She has been eating less, trying to follow a low-carb, low-sodium diet.  -Current exercise habits: Patient is not follow any specific exercise regimen. -Denies hypotensive/hypertensive symptoms -Educated on Daily salt intake goal < 2300 mg; Exercise goal of 150 minutes per week; -Counseled to monitor BP at home weekly, document, and provide log at future appointments -Recommended to continue current medication  Hyperlipidemia: (LDL goal < 70) -Controlled -Current treatment: . Pravastatin 80 mg daily  -Medications previously tried: NA  -Educated on Importance of limiting foods high in cholesterol; -Recommended to continue current  medication  Diabetes (A1c goal <8%) -Controlled -Current medications: . Trulicity 1.5 mg weekly  . Humalog 75-25 20 units before breakfast, 10 units before supper  -Medications previously tried: NA  - Patient found that her previous doses of insulin were causing her to have significant lows given her appetite changes, so she has been giving reduced insulin amounts as discussed with CPP or skipping her insulin entirely. -Current home glucose readings: Blood sugar (units of insulin given)  Fasting Pre-Dinner Bedtime  9-Mar 126 (20)    8-Mar 119 (20) 108 (10)   7-Mar 145 (20) 138 (18)   6-Mar 134 (40) 278 (0) 122  5-Mar 123 (20) 120 (10)   4-Mar 90 (20) 120 (10)   3-Mar 102 (20) 53 (0)   1-Mar 123 (40) 73 (0)   28-Feb 111 (20) 92 (0) 109  27-Feb 126 (20) 103 (0)   26-Feb 107 (20) 103 (0)   25-Feb 99 (20) 118 (0)   24-Feb 106 (20) 103 (18) 90       Average 116 117 107   -Reports hypoglycemic symptoms, which she treated with fasting acting carbs or fast food one time. -Educated on A1c and blood sugar goals; Prevention and management of hypoglycemic episodes; -Counseled to check feet daily and get yearly eye exams -Recommended decreasing Humalog 75/25 to 10 units before breakfast, 10 units before supper Recommend starting Farxiga 10 mg daily at next follow-up for CKD protection  Atrial Fibrillation (Goal: prevent stroke and major bleeding) -Controlled -CHADSVASC: 5 -Current treatment: . Rate control: Diltiazem CD 300 mg daily  . Anticoagulation: Eliquis  -Medications previously tried: NA - Eliquis PAP submitted by Dr. Tyrell Antonio office on 10/27/20. -Home BP and HR readings: NA  -Counseled on avoidance of NSAIDs due to increased bleeding risk with anticoagulants; -Recommended to continue current medication  Asthma (Goal: control symptoms and prevent exacerbations) -Controlled -Current treatment  . Proventil HFA 2 puffs every 6 hours as needed . Advair 1 puff twice daily   -Medications previously tried: NA  -Exacerbations requiring treatment in last 6 months: None -Patient reports  consistent use of maintenance inhaler -Frequency of rescue inhaler use: NA -Counseled on When to use rescue inhaler -Recommended to continue current medication   Patient Goals/Self-Care Activities . Over the next 90 days, patient will:  - check glucose 3 times daily, before breakfast, supper, and bedtime, document, and provide at future appointments check blood pressure weekly, document, and provide at future appointments  Follow Up Plan: Telephone follow up appointment with care management team member scheduled for: 12/17/2020 at 9:30 AM      Medication Assistance: Application for Trulicity, Humalog, Eliquis  medication assistance program. in process.  Anticipated assistance start date 11/19/2020.  See plan of care for additional detail.  Patient's preferred pharmacy is:  [facility], [place] [address] Phone: [phone] Fax: [phone]  [facility] [address], [place] - [place] [address] [place] [address] Phone: [phone] Fax: [phone]  [facility] [place], [place] - [address] [place] [address] Phone: [phone] Fax: [phone]  Uses pill box? Yes Pt endorses 100% compliance  We discussed: Current pharmacy is preferred with insurance plan and patient is satisfied with pharmacy services Patient decided to: Continue current medication management strategy  Care Plan and Follow Up Patient Decision:  Patient agrees to Care Plan and Follow-up.  Plan: Telephone follow up appointment with care management team member scheduled for:  12/17/2020 at 9:30 AM  [facility] [phone]

## 2020-11-07 DIAGNOSIS — N183 Chronic kidney disease, stage 3 unspecified: Secondary | ICD-10-CM | POA: Diagnosis not present

## 2020-11-07 DIAGNOSIS — E559 Vitamin D deficiency, unspecified: Secondary | ICD-10-CM | POA: Diagnosis not present

## 2020-11-07 DIAGNOSIS — D631 Anemia in chronic kidney disease: Secondary | ICD-10-CM | POA: Diagnosis not present

## 2020-11-10 ENCOUNTER — Ambulatory Visit: Payer: Medicare PPO | Admitting: Family Medicine

## 2020-11-12 DIAGNOSIS — N183 Chronic kidney disease, stage 3 unspecified: Secondary | ICD-10-CM | POA: Diagnosis not present

## 2020-11-12 DIAGNOSIS — D631 Anemia in chronic kidney disease: Secondary | ICD-10-CM | POA: Diagnosis not present

## 2020-11-12 DIAGNOSIS — E559 Vitamin D deficiency, unspecified: Secondary | ICD-10-CM | POA: Diagnosis not present

## 2020-11-17 ENCOUNTER — Other Ambulatory Visit: Payer: Self-pay

## 2020-11-17 ENCOUNTER — Ambulatory Visit
Admission: RE | Admit: 2020-11-17 | Discharge: 2020-11-17 | Disposition: A | Discharge status: Home / Self Care | Payer: Medicare PPO | Source: Ambulatory Visit | Attending: Family Medicine | Admitting: Family Medicine

## 2020-11-17 DIAGNOSIS — Z1231 Encounter for screening mammogram for malignant neoplasm of breast: Secondary | ICD-10-CM

## 2020-11-18 NOTE — Progress Notes (Signed)
Name: Amber Reyes   MRN: [id number]    DOB: 14-May-1946   Date:11/19/2020       Progress Note  Subjective  Chief Complaint  Follow up   HPI DMII controlled: She has been compliant with medication,she is on  Humalog 75/25 since  Feb 2018, also started on Trulicity in 2505 [id number]%9.0% ,6.9% and today it is 6.8 % Taking Avapro for nephropathy, still has some microalbuminuria, last level 78, she is on statin for dyslipidemia.No polyphagia, polydipsia or polyuria. She contacted [place] recently because her glucose was dropping in the middle of the night around 60's. Dose was adjusted from 50 unit in am and 22 unit in pm, she is now taking 10 units BID and fasting has been in the low 100-120, at night glucose has been in the 90-low 100's. Continue current regiment   HTN/CHF/Afib/Pulmonary hypertension : taking medications and denies side effects of medications, bp today was slightly up but she forgot to take medications this morning.  Usually in the 130's/70's she denies chest painno recentpalpitation, found to have Afib on EKG done by Dr. Fletcher Anon, she states unable to use her CPAP lately . She has orthopnea- uses two pillows, SOB is stable, and edema has been stable. Echo done 11/2019LV function is normaland unable to assess diastolic dysfunction, severe leftatrium dilation , mild elevation of Pulmonary pressure. She is currently on Eliquisand also cardizem for  Afib and is rate controlled. We will recheck labs today   Hyperlipidemia: taking Pravastatin, we will continue current medication,reviewed labs done09/2021LDL was 52 ,continue current medications.   RLS: symptoms are still present, described as soreness on both legs at night, improves when she rubs her legs. She was having cramps after taking lasix, but is currently taking lasix every other day and has been doing better.Unchanged   CKIII: [facility], last labs reviewed still stage III  CKI , GFR stable, last level done 10/2020 was 51 , no itching , good urine output, she is not taking potassium because she had hyperkalemia , she has secondary hyperparathyroidism , she has labs monitored by [facility]   OSA: she was wearing CPAP machineevery night, she finally got a new machine, wakes up feeling rested.   Morbid Obesity:she gets Trulicity through [employer] and she is losing weight, down 14 lbs since September 2021, she is trying to avoid carbohydrates, eating more vegetables  Asthma Moderate persistent/Chronic bronchitis: . She is compliant and uses Advair twice daily as prescribed, no side effects. No wheezing , morning cough not as frequent now that she has been using a new CPAP machine, but still has  sob only with activity that is stable   Atherosclerosis of aorta and echo showed mild dilation of ascending aorta, continue statin and eliquis.Reviewed labs with patient   Chronic back pain: she cannot stand or walk for a long time because increases the pain, dull pain, no radiculitis. She states Tylenol controls symptoms   Anemia: Hbg down to 9.9, we will check iron studies before replacing it with iron  Patient Active Problem List   Diagnosis Date Noted  . Hypertension associated with stage 3 chronic kidney disease due to type 2 diabetes mellitus ([place]) 02/06/2020  . History of colonic polyps   . Polyp of colon   . Thoracic aortic atherosclerosis ([place]) 12/30/2016  . Controlled type 2 diabetes mellitus with stage 3 chronic kidney disease ([place]) 12/23/2016  . Venous insufficiency 05/21/2016  . Hypertensive heart disease 05/21/2016  .  Anemia in chronic kidney disease 12/22/2015  . Pulmonary hypertension (HCC) 12/18/2015  . 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 Anxiety 04/12/2015  . Chronic kidney disease (CKD), stage III (moderate) (HCC) 04/12/2015  . Edema extremities 04/12/2015  . 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 Asthma, moderate persistent 04/12/2015  . Morbid  obesity, unspecified obesity type (HCC) 04/12/2015  . Obstructive apnea 04/12/2015  . Restless leg 04/12/2015  . Allergic rhinitis 04/12/2015  . Vitamin D deficiency 04/12/2015  . Controlled gout 04/12/2015  . Premature atrial contractions 11/29/2013  . Atrial fibrillation (HCC) 11/09/2013  . Benign essential hypertension   . Hyperlipidemia     Past Surgical History:  Procedure Laterality Date  . CARDIAC CATHETERIZATION  2002   [facility]  . COLONOSCOPY WITH PROPOFOL N/A 09/25/2018   Procedure: COLONOSCOPY WITH PROPOFOL;  Surgeon: Anna, Kiran, MD;  Location: [facility] ENDOSCOPY;  Service: Gastroenterology;  Laterality: N/A;  . COLONOSCOPY WITH PROPOFOL N/A 04/13/2019   Procedure: COLONOSCOPY WITH PROPOFOL;  Surgeon: Anna, Kiran, MD;  Location: [facility] ENDOSCOPY;  Service: Gastroenterology;  Laterality: N/A;  . COLONOSCOPY WITH PROPOFOL N/A 06/27/2020   Procedure: COLONOSCOPY WITH PROPOFOL;  Surgeon: Anna, Kiran, MD;  Location: [facility] ENDOSCOPY;  Service: Gastroenterology;  Laterality: N/A;  . PERICARDIUM SURGERY      Family History  Problem Relation Age of Onset  . Heart attack Mother   . Hypertension Mother   . Breast cancer Neg Hx     Social History   Tobacco Use  . Smoking status: Never Smoker  . Smokeless tobacco: Never Used  . Tobacco comment: n/a  Substance Use Topics  . Alcohol use: No    Alcohol/week: 0.0 standard drinks     Current Outpatient Medications:  .  albuterol (PROVENTIL HFA;VENTOLIN HFA) 108 (90 BASE) MCG/ACT inhaler, Inhale 2 puffs into the lungs every 6 (six) hours as needed for wheezing or shortness of breath., Disp: , Rfl:  .  allopurinol (ZYLOPRIM) 100 MG tablet, TAKE 1 TABLET TWICE DAILY, Disp: 180 tablet, Rfl: 1 .  Blood Glucose Calibration (TRUE METRIX LEVEL 1) Low SOLN, 1 each by In Vitro route once a week., Disp: 1 each, Rfl: 1 .  Blood Glucose Monitoring Suppl (TRUE METRIX AIR GLUCOSE METER) w/Device KIT, Inject 1 each into the skin 4 (four) times daily as  needed. Check fsbs 4 times daily E11.22, Disp: 1 kit, Rfl: 0 .  Cholecalciferol (VITAMIN D) 2000 UNITS tablet, Take 2,000 Units by mouth daily., Disp: , Rfl:  .  diltiazem (CARDIZEM CD) 300 MG 24 hr capsule, TAKE 1 CAPSULE EVERY DAY, Disp: 90 capsule, Rfl: 1 .  DROPLET PEN NEEDLES 30G X 8 MM MISC, USE TWICE DAILY WITH INSULIN, Disp: 200 each, Rfl: 3 .  Dulaglutide (TRULICITY) 1.5 MG/0.5ML SOPN, Inject 1.5 mg into the skin once a week., Disp: 6 mL, Rfl: 3 .  Elastic Bandages & Supports (MEDICAL COMPRESSION STOCKINGS) MISC, 2 each by Does not apply route daily., Disp: 2 each, Rfl: 2 .  ELIQUIS 5 MG TABS tablet, TAKE 1 TABLET TWICE DAILY, Disp: 180 tablet, Rfl: 3 .  fluticasone (FLONASE) 50 MCG/ACT nasal spray, USE 2 SPRAYS IN EACH NOSTRIL EVERY DAY, Disp: 48 g, Rfl: 2 .  Fluticasone-Salmeterol (ADVAIR) 250-50 MCG/DOSE AEPB, INHALE 1 DOSE BY MOUTH TWICE DAILY, Disp: 3 each, Rfl: 1 .  furosemide (LASIX) 40 MG tablet, TAKE 1 TABLET (40 MG TOTAL) BY MOUTH 2 (TWO) TIMES DAILY AS NEEDED., Disp: 180 tablet, Rfl: 1 .  irbesartan (AVAPRO) 300 MG tablet,   TAKE 1 TABLET EVERY DAY, Disp: 90 tablet, Rfl: 1 .  pravastatin (PRAVACHOL) 80 MG tablet, TAKE 1 TABLET (80 MG TOTAL) BY MOUTH EVERY EVENING., Disp: 90 tablet, Rfl: 1 .  TRUE METRIX BLOOD GLUCOSE TEST test strip, CHECK BLOOD SUGAR FOUR TIMES DAILY, Disp: 400 strip, Rfl: 0 .  colchicine (COLCRYS) 0.6 MG tablet, Take 1-2 tablets (0.6-1.2 mg total) by mouth daily as needed. (Patient not taking: Reported on 11/19/2020), Disp: 30 tablet, Rfl: 0 .  Insulin Lispro Prot & Lispro (HUMALOG MIX 75/25 KWIKPEN) (75-25) 100 UNIT/ML Kwikpen, Inject 10 Units into the skin 2 (two) times daily with a meal., Disp: 45 mL, Rfl: 0  No Known Allergies  I personally reviewed active problem list, medication list, allergies, family history, social history, health maintenance with the patient/caregiver today.   ROS  Constitutional: Negative for fever, positive for  weight change.   Respiratory: positive  for cough and shortness of breath.   Cardiovascular: Negative for chest pain or palpitations.  Gastrointestinal: Negative for abdominal pain, no bowel changes.  Musculoskeletal: positive for gait problem or joint swelling.  Skin: Negative for rash.  Neurological: Negative for dizziness or headache.  No other specific complaints in a complete review of systems (except as listed in HPI above).  Objective  Vitals:   11/19/20 0755  BP: (!) 144/70  Pulse: 86  Resp: 16  Temp: 97.6 F (36.4 C)  TempSrc: Oral  SpO2: 99%  Weight: (!) 374 lb (169.6 kg)  Height: 5' 10" (1.778 m)    Body mass index is 53.66 kg/m.  Physical Exam  Constitutional: Patient appears well-developed and well-nourished. Obese  No distress.  HEENT: head atraumatic, normocephalic, pupils equal and reactive to light, neck supple Cardiovascular: Normal rate, irregular rhythm and normal heart sounds.  No murmur heard. No BLE edema. Pulmonary/Chest: Effort normal and breath sounds normal. No respiratory distress. Abdominal: Soft.  There is no tenderness. Psychiatric: Patient has a normal mood and affect. behavior is normal. Judgment and thought content normal.  Diabetic Foot Exam: Diabetic Foot Exam - Simple   Simple Foot Form Diabetic Foot exam was performed with the following findings: Yes 11/19/2020  8:23 AM  Visual Inspection See comments: Yes Sensation Testing Intact to touch and monofilament testing bilaterally: Yes Pulse Check Posterior Tibialis and Dorsalis pulse intact bilaterally: Yes Comments Very dry skin, some callus formation       PHQ2/9: Depression screen [facility] 2/9 11/19/2020 06/19/2020 05/12/2020 02/06/2020 10/08/2019  Decreased Interest 0 0 0 0 0  Down, Depressed, Hopeless 0 0 0 0 0  PHQ - 2 Score 0 0 0 0 0  Altered sleeping - - 0 0 0  Tired, decreased energy - - 2 0 0  Change in appetite - - 0 0 0  Feeling bad or failure about yourself  - - 0 0 0  Trouble  concentrating - - 0 0 0  Moving slowly or fidgety/restless - - 0 0 0  Suicidal thoughts - - 0 0 0  PHQ-9 Score - - 2 0 0  Difficult doing work/chores - - Not difficult at all Not difficult at all Not difficult at all  Some recent data might be hidden    phq 9 is negative   Fall Risk: Fall Risk  11/19/2020 06/19/2020 05/12/2020 02/06/2020 10/08/2019  Falls in the past year? 0 0 0 0 0  Number falls in past yr: 0 0 0 0 0  Injury with Fall? 0 0 0 0 0  Risk for fall due to : - No Fall Risks - - -  Follow up - Falls prevention discussed - Falls evaluation completed -     Functional Status Survey: Is the patient deaf or have difficulty hearing?: No Does the patient have difficulty seeing, even when wearing glasses/contacts?: No Does the patient have difficulty concentrating, remembering, or making decisions?: No Does the patient have difficulty walking or climbing stairs?: Yes Does the patient have difficulty dressing or bathing?: Yes Does the patient have difficulty doing errands alone such as visiting a doctor's office or shopping?: No    Assessment & Plan  1. Controlled type 2 diabetes mellitus with stage 3 chronic kidney disease, with long-term current use of insulin (HCC)  - HM Diabetes Foot Exam - POCT HgB A1C  2. Moderate persistent asthma without complication  Doing well on Advair   3. Obstructive apnea   4. Morbid obesity ([place])  Discussed with the patient the risk posed by an increased BMI. Discussed importance of portion control, calorie counting and at least 150 minutes of physical activity weekly. Avoid sweet beverages and drink more water. Eat at least 6 servings of fruit and vegetables daily   5. Hyperlipidemia associated with type 2 diabetes mellitus ([place])  Continue statin therapy   6. Hypertension associated with stage 3 chronic kidney disease due to type 2 diabetes mellitus ([place])   7. Moderate persistent asthma with acute exacerbation   8. Hypertension  associated with type 2 diabetes mellitus ([place])   9. Atrial fibrillation, unspecified type ([place])  Rate controlled   10. Secondary hyperparathyroidism of renal origin ([place])   11. Pulmonary hypertension ([place])   12. Stage 3a chronic kidney disease [facility])  Sees nephrologist at [place]. Hypertensive heart disease with heart failure ([place])  Under the care of Dr. Fletcher Anon  14. Thoracic aortic atherosclerosis (HCC)  On statin therapy and Eliquis  15. Controlled gout  Doing well, no recent episodes   16. Chronic pain of left knee  Using topical medication prn   17. Controlled type 2 diabetes mellitus with microalbuminuria, with long-term current use of insulin (HCC)  - Insulin Lispro Prot & Lispro (HUMALOG MIX 75/25 KWIKPEN) (75-25) 100 UNIT/ML Kwikpen; Inject 10 Units into the skin 2 (two) times daily with a meal.  Dispense: 45 mL; Refill: 0  18. Anemia, unspecified type  - CBC with Differential/Platelet - Iron, TIBC and Ferritin Panel

## 2020-11-19 ENCOUNTER — Other Ambulatory Visit: Payer: Self-pay

## 2020-11-19 ENCOUNTER — Ambulatory Visit: Payer: Medicare PPO | Admitting: Family Medicine

## 2020-11-19 ENCOUNTER — Encounter: Payer: Self-pay | Admitting: Family Medicine

## 2020-11-19 VITALS — BP 144/70 | HR 86 | Temp 97.6°F | Resp 16 | Ht 70.0 in | Wt 374.0 lb

## 2020-11-19 DIAGNOSIS — J4540 Moderate persistent asthma, uncomplicated: Secondary | ICD-10-CM | POA: Diagnosis not present

## 2020-11-19 DIAGNOSIS — J4541 Moderate persistent asthma with (acute) exacerbation: Secondary | ICD-10-CM | POA: Diagnosis not present

## 2020-11-19 DIAGNOSIS — I272 Pulmonary hypertension, unspecified: Secondary | ICD-10-CM

## 2020-11-19 DIAGNOSIS — I152 Hypertension secondary to endocrine disorders: Secondary | ICD-10-CM

## 2020-11-19 DIAGNOSIS — I4891 Unspecified atrial fibrillation: Secondary | ICD-10-CM

## 2020-11-19 DIAGNOSIS — E1169 Type 2 diabetes mellitus with other specified complication: Secondary | ICD-10-CM

## 2020-11-19 DIAGNOSIS — N2581 Secondary hyperparathyroidism of renal origin: Secondary | ICD-10-CM | POA: Diagnosis not present

## 2020-11-19 DIAGNOSIS — Z794 Long term (current) use of insulin: Secondary | ICD-10-CM

## 2020-11-19 DIAGNOSIS — G4733 Obstructive sleep apnea (adult) (pediatric): Secondary | ICD-10-CM

## 2020-11-19 DIAGNOSIS — E1159 Type 2 diabetes mellitus with other circulatory complications: Secondary | ICD-10-CM

## 2020-11-19 DIAGNOSIS — N1831 Chronic kidney disease, stage 3a: Secondary | ICD-10-CM

## 2020-11-19 DIAGNOSIS — G8929 Other chronic pain: Secondary | ICD-10-CM

## 2020-11-19 DIAGNOSIS — E1129 Type 2 diabetes mellitus with other diabetic kidney complication: Secondary | ICD-10-CM

## 2020-11-19 DIAGNOSIS — E1122 Type 2 diabetes mellitus with diabetic chronic kidney disease: Secondary | ICD-10-CM | POA: Diagnosis not present

## 2020-11-19 DIAGNOSIS — N183 Chronic kidney disease, stage 3 unspecified: Secondary | ICD-10-CM | POA: Diagnosis not present

## 2020-11-19 DIAGNOSIS — D649 Anemia, unspecified: Secondary | ICD-10-CM | POA: Diagnosis not present

## 2020-11-19 DIAGNOSIS — E785 Hyperlipidemia, unspecified: Secondary | ICD-10-CM

## 2020-11-19 DIAGNOSIS — I110 Hypertensive heart disease with heart failure: Secondary | ICD-10-CM

## 2020-11-19 DIAGNOSIS — M25562 Pain in left knee: Secondary | ICD-10-CM

## 2020-11-19 DIAGNOSIS — R809 Proteinuria, unspecified: Secondary | ICD-10-CM

## 2020-11-19 DIAGNOSIS — I129 Hypertensive chronic kidney disease with stage 1 through stage 4 chronic kidney disease, or unspecified chronic kidney disease: Secondary | ICD-10-CM

## 2020-11-19 DIAGNOSIS — I700 Atherosclerosis of aorta: Secondary | ICD-10-CM

## 2020-11-19 DIAGNOSIS — M109 Gout, unspecified: Secondary | ICD-10-CM

## 2020-11-19 LAB — POCT GLYCOSYLATED HEMOGLOBIN (HGB A1C): Hemoglobin A1C: 6.8 % — AB (ref 4.0–5.6)

## 2020-11-19 MED ORDER — INSULIN LISPRO PROT & LISPRO (75-25 MIX) 100 UNIT/ML KWIKPEN
10.0000 [IU] | PEN_INJECTOR | Freq: Two times a day (BID) | SUBCUTANEOUS | 0 refills | Status: DC
Start: 2020-11-19 — End: 2022-01-06

## 2020-11-20 ENCOUNTER — Encounter: Payer: Self-pay | Admitting: Cardiovascular Disease

## 2020-11-20 ENCOUNTER — Ambulatory Visit (INDEPENDENT_AMBULATORY_CARE_PROVIDER_SITE_OTHER): Payer: Medicare PPO | Admitting: Cardiovascular Disease

## 2020-11-20 VITALS — BP 150/58 | HR 71 | Ht 70.0 in | Wt 375.0 lb

## 2020-11-20 DIAGNOSIS — G4733 Obstructive sleep apnea (adult) (pediatric): Secondary | ICD-10-CM | POA: Diagnosis not present

## 2020-11-20 DIAGNOSIS — I10 Essential (primary) hypertension: Secondary | ICD-10-CM | POA: Diagnosis not present

## 2020-11-20 DIAGNOSIS — Z9989 Dependence on other enabling machines and devices: Secondary | ICD-10-CM

## 2020-11-20 DIAGNOSIS — I5032 Chronic diastolic (congestive) heart failure: Secondary | ICD-10-CM | POA: Diagnosis not present

## 2020-11-20 DIAGNOSIS — I482 Chronic atrial fibrillation, unspecified: Secondary | ICD-10-CM

## 2020-11-20 LAB — CBC WITH DIFFERENTIAL/PLATELET
Absolute Monocytes: 509 cells/uL (ref 200–950)
Basophils Absolute: 53 cells/uL (ref 0–200)
Basophils Relative: 0.7 %
Eosinophils Absolute: 243 cells/uL (ref 15–500)
Eosinophils Relative: 3.2 %
HCT: 31.9 % — ABNORMAL LOW (ref 35.0–45.0)
Hemoglobin: 10.2 g/dL — ABNORMAL LOW (ref 11.7–15.5)
Lymphs Abs: 1315 cells/uL (ref 850–3900)
MCH: 27.5 pg (ref 27.0–33.0)
MCHC: 32 g/dL (ref 32.0–36.0)
MCV: 86 fL (ref 80.0–100.0)
MPV: 11.7 fL (ref 7.5–12.5)
Monocytes Relative: 6.7 %
Neutro Abs: 5480 cells/uL (ref 1500–7800)
Neutrophils Relative %: 72.1 %
Platelets: 225 10*3/uL (ref 140–400)
RBC: 3.71 10*6/uL — ABNORMAL LOW (ref 3.80–5.10)
RDW: 15.8 % — ABNORMAL HIGH (ref 11.0–15.0)
Total Lymphocyte: 17.3 %
WBC: 7.6 10*3/uL (ref 3.8–10.8)

## 2020-11-20 LAB — IRON,TIBC AND FERRITIN PANEL
%SAT: 15 % (calc) — ABNORMAL LOW (ref 16–45)
Ferritin: 105 ng/mL (ref 16–288)
Iron: 42 ug/dL — ABNORMAL LOW (ref 45–160)
TIBC: 275 mcg/dL (calc) (ref 250–450)

## 2020-11-20 NOTE — Progress Notes (Signed)
Cardiology Office Note   Date:  11/20/2020   ID:  Amber Reyes, DOB [birth year], MRN [id number]  PCP:  Steele Sizer, MD  Cardiologist:   Kathlyn Sacramento, MD   Chief Complaint  Patient presents with  . Follow-up    12 month F/U      History of Present Illness: Amber Reyes is a 75 y.o. female who presents for a followup visit regarding chronic diastolic heart failure and chronic atrial fibrillation. 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  Atrial fibrillation is being treated with rate control and anticoagulation. Most recent echocardiogram in 2019 showed normal LV systolic function with severely dilated left atrium and mild pulmonary hypertension. She has been doing reasonably well overall with no chest pain or worsening dyspnea.  She has stable bilateral leg edema.  She takes furosemide usually every other day.  She has chronic low back pain which limits her activities. She continues to have some financial difficulty affording Eliquis.   Past Medical History:  Diagnosis Date  . Allergic rhinitis, cause unspecified   . Anxiety   . Chronic diastolic CHF (congestive heart failure) ([place])   . CKD (chronic kidney disease), stage III ([place])   . Edema   . Essential hypertension, benign   . Gout, unspecified   . Heart murmur    as child  . Hyperlipidemia   . Lipoma of unspecified site   . Other and unspecified disc disorder of lumbar region   . Other general symptoms(780.99)   . PAC (premature atrial contraction)   . Renal insufficiency   . Restless legs syndrome (RLS)   . Sebaceous cyst   . Super obesity   . Type II or unspecified type diabetes mellitus without mention of complication, uncontrolled   . Unspecified asthma(493.90)   . Unspecified disease of pericardium   . Unspecified sleep apnea   .  Unspecified vitamin D deficiency   . Vaginitis and vulvovaginitis, unspecified   . Venous insufficiency     Past Surgical History:  Procedure Laterality Date  . CARDIAC CATHETERIZATION  2002   [facility]  . COLONOSCOPY WITH PROPOFOL N/A 09/25/2018   Procedure: COLONOSCOPY WITH PROPOFOL;  Surgeon: Jonathon Bellows, MD;  Location[facility] ENDOSCOPY;  Service: Gastroenterology;  Laterality: N/A;  . COLONOSCOPY WITH PROPOFOL N/A 04/13/2019   Procedure: COLONOSCOPY WITH PROPOFOL;  Surgeon: Jonathon Bellows, MD;  [facility] ENDOSCOPY;  Service: Gastroenterology;  Laterality: N/A;  . COLONOSCOPY WITH PROPOFOL N/A 06/27/2020   Procedure: COLONOSCOPY WITH PROPOFOL;  Surgeon: Jonathon Bellows, MD;  Location: [facility] ENDOSCOPY;  Service: Gastroenterology;  Laterality: N/A;  . PERICARDIUM SURGERY       Current Outpatient Medications  Medication Sig Dispense Refill  . albuterol (PROVENTIL HFA;VENTOLIN HFA) 108 (90 BASE) MCG/ACT inhaler Inhale 2 puffs into the lungs every 6 (six) hours as needed for wheezing or shortness of breath.    . allopurinol (ZYLOPRIM) 100 MG tablet TAKE 1 TABLET TWICE DAILY 180 tablet 1  . Blood Glucose Calibration (TRUE METRIX LEVEL 1) Low SOLN 1 each by In Vitro route once a week. 1 each 1  . Blood Glucose Monitoring Suppl (TRUE METRIX AIR GLUCOSE METER) w/Device KIT Inject 1 each into the skin 4 (four) times daily as needed. Check fsbs 4 times daily E11.22 1 kit 0  . Cholecalciferol (VITAMIN D) 2000 UNITS tablet Take 2,000 Units by mouth daily.    Marland Kitchen  colchicine (COLCRYS) 0.6 MG tablet Take 1-2 tablets (0.6-1.2 mg total) by mouth daily as needed. 30 tablet 0  . diltiazem (CARDIZEM CD) 300 MG 24 hr capsule TAKE 1 CAPSULE EVERY DAY 90 capsule 1  . DROPLET PEN NEEDLES 30G X 8 MM MISC USE TWICE DAILY WITH INSULIN 200 each 3  . Dulaglutide (TRULICITY) 1.5 AC/1.6SA SOPN Inject 1.5 mg into the skin once a week. 6 mL 3  . Elastic Bandages & Supports (MEDICAL COMPRESSION STOCKINGS) MISC 2 each by Does not  apply route daily. 2 each 2  . ELIQUIS 5 MG TABS tablet TAKE 1 TABLET TWICE DAILY 180 tablet 3  . fluticasone (FLONASE) 50 MCG/ACT nasal spray USE 2 SPRAYS IN EACH NOSTRIL EVERY DAY 48 g 2  . Fluticasone-Salmeterol (ADVAIR) 250-50 MCG/DOSE AEPB INHALE 1 DOSE BY MOUTH TWICE DAILY 3 each 1  . furosemide (LASIX) 40 MG tablet TAKE 1 TABLET (40 MG TOTAL) BY MOUTH 2 (TWO) TIMES DAILY AS NEEDED. 180 tablet 1  . Insulin Lispro Prot & Lispro (HUMALOG MIX 75/25 KWIKPEN) (75-25) 100 UNIT/ML Kwikpen Inject 10 Units into the skin 2 (two) times daily with a meal. 45 mL 0  . irbesartan (AVAPRO) 300 MG tablet TAKE 1 TABLET EVERY DAY 90 tablet 1  . pravastatin (PRAVACHOL) 80 MG tablet TAKE 1 TABLET (80 MG TOTAL) BY MOUTH EVERY EVENING. 90 tablet 1  . TRUE METRIX BLOOD GLUCOSE TEST test strip CHECK BLOOD SUGAR FOUR TIMES DAILY 400 strip 0   No current facility-administered medications for this visit.    Allergies:   Patient has no known allergies.    Social History:  The patient  reports that she has never smoked. She has never used smokeless tobacco. She reports that she does not drink alcohol and does not use drugs.   Family History:  The patient's family history includes Heart attack in her mother; Hypertension in her mother.    ROS:  Please see the history of present illness.   Otherwise, review of systems are positive for none.   All other systems are reviewed and negative.    PHYSICAL EXAM: VS:  BP (!) 150/58 (BP Location: Left Arm, Patient Position: Sitting, Cuff Size: Large)   Pulse 71   Ht '5\' 10"'  (1.778 m)   Wt (!) 375 lb (170.1 kg)   SpO2 98%   BMI 53.81 kg/m  , BMI Body mass index is 53.81 kg/m. GEN: Well nourished, well developed, in no acute distress  HEENT: normal  Neck: no JVD, carotid bruits, or masses Cardiac: Irregularly irregular; no rubs, or gallops.  No murmurs today.  Mild bilateral leg edema. Respiratory:  clear to auscultation bilaterally, normal work of breathing GI:  soft, nontender, nondistended, + BS MS: no deformity or atrophy  Skin: warm and dry, no rash Neuro:  Strength and sensation are intact Psych: euthymic mood, full affect   EKG:  EKG is ordered today. The ekg ordered today demonstrates atrial fibrillation with possible old inferior infarct and poor R wave progression in the anterior leads.   Recent Labs: 05/12/2020: ALT 8; BUN 28; Creat 1.29; Potassium 4.2; Sodium 138 11/19/2020: Hemoglobin 10.2; Platelets 225    Lipid Panel    Component Value Date/Time   CHOL 115 05/12/2020 0859   CHOL 139 04/16/2015 1138   TRIG 82 05/12/2020 0859   HDL 47 (L) 05/12/2020 0859   HDL 52 04/16/2015 1138   CHOLHDL 2.4 05/12/2020 0859   VLDL 19 12/23/2016 0941   LDLCALC 52  05/12/2020 0859      Wt Readings from Last 3 Encounters:  11/20/20 (!) 375 lb (170.1 kg)  11/19/20 (!) 374 lb (169.6 kg)  06/27/20 (!) 383 lb (173.7 kg)       No flowsheet data found.    ASSESSMENT AND PLAN:  1.  Chronic atrial fibrillation:  Chads Vasc score is 5 .  Continue anticoagulation with Eliquis.  Reviewed her recent labs which showed stable renal function and stable mild anemia.  2. Chronic diastolic heart failure: She appears to be euvolemic on furosemide every other day  3. Essential hypertension: Blood pressure is reasonably controlled  4.  Obstructive sleep apnea: She uses CPAP on a regular basis.   Disposition:   FU with me in 12 months  Signed,  Kathlyn Sacramento, MD  11/20/2020 8:49 AM    [facility]

## 2020-11-20 NOTE — Patient Instructions (Signed)

## 2020-12-11 ENCOUNTER — Telehealth: Payer: Self-pay

## 2020-12-11 DIAGNOSIS — G4733 Obstructive sleep apnea (adult) (pediatric): Secondary | ICD-10-CM | POA: Diagnosis not present

## 2020-12-11 NOTE — Telephone Encounter (Signed)
Copied from [place] [id number]. Topic: General - Other >> Dec 11, 2020 11:36 AM Pawlus, Brayton Layman A wrote: Reason for CRM: Pt wanted to let Dr Ancil Boozer know that [facility] will be faxing over paperwork for her to complete so that she can get her prescriptions filled. FYI

## 2020-12-12 ENCOUNTER — Other Ambulatory Visit: Payer: Self-pay

## 2020-12-12 DIAGNOSIS — J4540 Moderate persistent asthma, uncomplicated: Secondary | ICD-10-CM

## 2020-12-14 MED ORDER — FLUTICASONE-SALMETEROL 250-50 MCG/DOSE IN AEPB
INHALATION_SPRAY | RESPIRATORY_TRACT | 1 refills | Status: DC
Start: 2020-12-14 — End: 2021-05-27

## 2020-12-15 ENCOUNTER — Other Ambulatory Visit: Payer: Self-pay

## 2020-12-15 MED ORDER — FLUTICASONE PROPIONATE 50 MCG/ACT NA SUSP: 2.0000 | Freq: Every day | NASAL | 2 refills | Status: DC

## 2020-12-16 ENCOUNTER — Telehealth: Payer: Self-pay

## 2020-12-16 NOTE — Progress Notes (Signed)
Spoke to patient to confirmed patient telephone appointment on 12/17/2020 for CCM at 9:30 am with [name] the Clinical pharmacist.   Patient Verbalized understanding and denies any side effects from any of her current medications  [place] Pharmacist Assistant [phone]

## 2020-12-17 ENCOUNTER — Ambulatory Visit (INDEPENDENT_AMBULATORY_CARE_PROVIDER_SITE_OTHER): Payer: Medicare PPO

## 2020-12-17 DIAGNOSIS — I152 Hypertension secondary to endocrine disorders: Secondary | ICD-10-CM

## 2020-12-17 DIAGNOSIS — E1169 Type 2 diabetes mellitus with other specified complication: Secondary | ICD-10-CM | POA: Diagnosis not present

## 2020-12-17 DIAGNOSIS — N183 Chronic kidney disease, stage 3 unspecified: Secondary | ICD-10-CM

## 2020-12-17 DIAGNOSIS — E785 Hyperlipidemia, unspecified: Secondary | ICD-10-CM | POA: Diagnosis not present

## 2020-12-17 DIAGNOSIS — E1159 Type 2 diabetes mellitus with other circulatory complications: Secondary | ICD-10-CM | POA: Diagnosis not present

## 2020-12-17 DIAGNOSIS — Z794 Long term (current) use of insulin: Secondary | ICD-10-CM | POA: Diagnosis not present

## 2020-12-17 DIAGNOSIS — E1122 Type 2 diabetes mellitus with diabetic chronic kidney disease: Secondary | ICD-10-CM

## 2020-12-17 NOTE — Patient Instructions (Signed)
Visit Information It was great speaking with you today!  Please let me know if you have any questions about our visit.  Goals Addressed            This Visit's Progress   . Monitor and Manage My Blood Sugar-Diabetes Type 2       Timeframe:  Long-Range Goal Priority:  High Start Date:  10/29/2020                           Expected End Date:  05/01/2021                     Follow Up Date 02/11/2021    -check blood sugar three times daily: Before breakfast, before supper, and at bedtime - check blood sugar if I feel it is too high or too low - enter blood sugar readings and medication or insulin into daily log    Why is this important?    Checking your blood sugar at home helps to keep it from getting very high or very low.   Writing the results in a diary or log helps the doctor know how to care for you.   Your blood sugar log should have the time, date and the results.   Also, write down the amount of insulin or other medicine that you take.   Other information, like what you ate, exercise done and how you were feeling, will also be helpful.     Notes:     . Track and Manage My Blood Pressure-Hypertension       Timeframe:  Long-Range Goal Priority:  High Start Date:  12/17/2020                            Expected End Date:  06/18/2021                    Follow Up Date 01/19/2021    - check blood pressure 3 times per week - check blood pressure daily    Why is this important?    You won't feel high blood pressure, but it can still hurt your blood vessels.   High blood pressure can cause heart or kidney problems. It can also cause a stroke.   Making lifestyle changes like losing a little weight or eating less salt will help.   Checking your blood pressure at home and at different times of the day can help to control blood pressure.   If the doctor prescribes medicine remember to take it the way the doctor ordered.   Call the office if you cannot afford the medicine or  if there are questions about it.     Notes:        Patient Care Plan: General Pharmacy (Adult)    Problem Identified: Hypertension, Hyperlipidemia, Diabetes, Atrial Fibrillation, Asthma, Chronic Kidney Disease and Gout   Priority: High    Long-Range Goal: Patient-Specific Goal   Start Date: 10/29/2020  Expected End Date: 05/01/2021  This Visit's Progress: On track  Recent Progress: On track  Priority: High  Note:   Current Barriers:  . Unable to independently afford treatment regimen . Suboptimal therapeutic regimen for diabetes  Pharmacist Clinical Goal(s):  Marland Kitchen Over the next 90 days, patient will maintain control of diabetes as evidenced by A1c less than 8%  through collaboration with PharmD and provider.   Interventions: . 1:1  collaboration with Steele Sizer, MD regarding development and update of comprehensive plan of care as evidenced by provider attestation and co-signature . Inter-disciplinary care team collaboration (see longitudinal plan of care) . Comprehensive medication review performed; medication list updated in electronic medical record  Hypertension (BP goal <140/90) -Not Ideally Controlled -Current treatment: . Diltiazem CD 300 mg daily  . Furosemide 40 mg twice daily as needed (uses every other day)  . Irbersartan 300 mg daily  -Medications previously tried: NA  -Current home readings:   153/73 HR 79   144/65 HR 65  144/63 HR 61  140/61 HR 65  -Current dietary habits: Since starting Trulicity, patient's diet has been much lessened. She has been eating less, trying to follow a low-carb, low-sodium diet.   -Current exercise habits: Unable to walk long distances.  -Denies hypotensive/hypertensive symptoms -Educated on Daily salt intake goal < 2300 mg; Exercise goal of 150 minutes per week; -Counseled to monitor BP at home 2-3 times weekly, document, and provide log at future appointments -Recommended to continue current medication  Hyperlipidemia: (LDL  goal < 70) -Controlled -Current treatment: . Pravastatin 80 mg daily  -Medications previously tried: NA  -Educated on Importance of limiting foods high in cholesterol; -Recommended to continue current medication  Diabetes (A1c goal <8%) -Controlled -Current medications: . Trulicity 1.5 mg weekly  . Humalog 75-25 20 units before breakfast, 10 units before supper  -Medications previously tried: NA  - Patient found that her previous doses of insulin were causing her to have significant lows given her appetite changes, so she has been giving reduced insulin amounts as discussed with CPP or skipping her insulin entirely. -Current home glucose readings: Blood sugar (units of insulin given)  Breakfast Pre-dinner Bedtime  27-Apr 97    26-Apr 102 125   25-Apr 105 86   24-Apr 140  125  23-Apr 142 136   22-Apr 114 150   20-Apr 146 109   19-Apr 132 163   18-Apr 130 128   17-Apr 155 145   16-Apr 125 105   Average 126 127    -Denies hypoglycemic symptoms. -Educated on A1c and blood sugar goals; Prevention and management of hypoglycemic episodes; -Counseled to check feet daily and get yearly eye exams -Recommend Farxiga 5 mg daily at next follow-up for CKD / heart failure protection  Atrial Fibrillation (Goal: prevent stroke and major bleeding) -Controlled -CHADSVASC: 5 -Current treatment: . Rate control: Diltiazem CD 300 mg daily  . Anticoagulation: Eliquis 5 mg twice daily  -Medications previously tried: NA - Eliquis PAP submitted by Dr. Tyrell Antonio office on 10/27/20. -Home BP and HR readings: NA  -Counseled on avoidance of NSAIDs due to increased bleeding risk with anticoagulants; -Recommended to continue current medication  Asthma (Goal: control symptoms and prevent exacerbations) -Controlled -Current treatment  . Proventil HFA 2 puffs every 6 hours as needed . Advair 1 puff twice daily  -Medications previously tried: NA  -Exacerbations requiring treatment in last 6 months:  None -Patient reports consistent use of maintenance inhaler -Frequency of rescue inhaler use: NA -Counseled on When to use rescue inhaler -Recommended to continue current medication  Patient Goals/Self-Care Activities . Over the next 90 days, patient will:  - check glucose 3 times daily, before breakfast, supper, and bedtime, document, and provide at future appointments check blood pressure 2-3 times weekly, document, and provide at future appointments  Follow Up Plan: Telephone follow up appointment with care management team member scheduled for:  02/11/2021 at 9:00 AM  Patient agreed to services and verbal consent obtained.   The patient verbalized understanding of instructions, educational materials, and care plan provided today and declined offer to receive copy of patient instructions, educational materials, and care plan.   Doristine Section, [facility] [phone]

## 2020-12-17 NOTE — Progress Notes (Signed)
Chronic Care Management Pharmacy Note  12/17/2020 Name:  Amber Reyes MRN:  [id number] DOB:  May 28, 1946  Subjective: Amber Reyes is an 75 y.o. year old female who is a primary patient of Steele Sizer, MD.  The CCM team was consulted for assistance with disease management and care coordination needs.    Engaged with patient by telephone for follow up visit in response to provider referral for pharmacy case management and/or care coordination services.   Consent to Services:  The patient was given information about Chronic Care Management services, agreed to services, and gave verbal consent prior to initiation of services.  Please see initial visit note for detailed documentation.   Patient Care Team: Steele Sizer, MD as PCP - General (Family Medicine) Wellington Hampshire, MD as PCP - Cardiology (Cardiology) Wellington Hampshire, MD as Consulting Physician (Cardiology) Mottl, Andrez Grime, MD as Referring Physician (Nephrology) Paulla Dolly Tamala Fothergill, DPM as Consulting Physician (Podiatry) Germaine Pomfret, [facility] as Pharmacist (Pharmacist)  Recent office visits: 11/19/20: Patient presented to Dr. Ancil Boozer for follow-up. A1c 6.8%. BP 144/70. Humalog 75/25 10 units twice daily  06/19/20: Patient presented to Clemetine Marker, LPN for AWV.   Recent consult visits: 11/20/20: Patient presented to Dr. Fletcher Anon (Cardiology) for follow-up. BP 150/58. No medication changes made.  11/12/20: Patient presented to Sherlynn Stalls, ANP (Nephrology) for follow-up. BP 175/73.   Hospital visits: None in previous 6 months  Objective:  Lab Results  Component Value Date   CREATININE 1.29 (H) 05/12/2020   BUN 28 (H) 05/12/2020   GFRNONAA 41 (L) 05/12/2020   GFRAA 48 (L) 05/12/2020   NA 138 05/12/2020   K 4.2 05/12/2020   CALCIUM 9.0 05/12/2020   CO2 29 05/12/2020    Lab Results  Component Value Date/Time   HGBA1C 6.8 (A) 11/19/2020 08:02 AM   HGBA1C 6.9 (A) 05/12/2020 08:38 AM   HGBA1C 6.8 (H)  05/14/2019 12:00 AM   HGBA1C 6.5 01/01/2019 07:48 AM   HGBA1C 6.6 08/30/2018 07:46 AM   HGBA1C 6.6 (A) 08/30/2018 07:46 AM   HGBA1C 6.6 08/30/2018 07:46 AM   MICROALBUR 5.4 05/14/2019 12:00 AM   MICROALBUR 5.7 04/25/2018 09:29 AM   MICROALBUR 20 08/15/2015 08:12 AM    Last diabetic Eye exam:  Lab Results  Component Value Date/Time   HMDIABEYEEXA No Retinopathy 08/18/2020 12:00 AM    Last diabetic Foot exam: No results found for: HMDIABFOOTEX   Lab Results  Component Value Date   CHOL 115 05/12/2020   HDL 47 (L) 05/12/2020   LDLCALC 52 05/12/2020   TRIG 82 05/12/2020   CHOLHDL 2.4 05/12/2020    Hepatic Function Latest Ref Rng & Units 05/12/2020 05/14/2019 11/25/2017  Total Protein 6.1 - 8.1 g/dL 6.8 6.6 6.4  Albumin 3.6 - 5.1 g/dL - - -  AST 10 - 35 U/L '15 11 13  ' ALT 6 - 29 U/L 8 5(L) 7  Alk Phosphatase 33 - 130 U/L - - -  Total Bilirubin 0.2 - 1.2 mg/dL 0.5 0.5 0.3  Bilirubin, Direct 0.0 - 0.2 mg/dL - - 0.0    No results found for: TSH, FREET4  CBC Latest Ref Rng & Units 11/19/2020 05/12/2020 05/14/2019  WBC 3.8 - 10.8 Thousand/uL 7.6 8.6 9.3  Hemoglobin 11.7 - 15.5 g/dL 10.2(L) 10.3(L) 10.2(L)  Hematocrit 35.0 - 45.0 % 31.9(L) 31.7(L) 31.9(L)  Platelets 140 - 400 Thousand/uL 225 229 227    Lab Results  Component Value Date/Time   VD25OH 45 05/12/2020 08:59  AM   VD25OH 48 05/14/2019 12:00 AM    Clinical ASCVD: Yes  The ASCVD Risk score Mikey Bussing DC Jr., et al., 2013) failed to calculate for the following reasons:   The valid total cholesterol range is 130 to 320 mg/dL    Depression screen [facility] 2/9 11/19/2020 06/19/2020 05/12/2020  Decreased Interest 0 0 0  Down, Depressed, Hopeless 0 0 0  PHQ - 2 Score 0 0 0  Altered sleeping - - 0  Tired, decreased energy - - 2  Change in appetite - - 0  Feeling bad or failure about yourself  - - 0  Trouble concentrating - - 0  Moving slowly or fidgety/restless - - 0  Suicidal thoughts - - 0  PHQ-9 Score - - 2  Difficult doing  work/chores - - Not difficult at all  Some recent data might be hidden    Social History   Tobacco Use  Smoking Status Never Smoker  Smokeless Tobacco Never Used  Tobacco Comment   n/a   BP Readings from Last 3 Encounters:  11/20/20 (!) 150/58  11/19/20 (!) 144/70  06/27/20 (!) 161/59   Pulse Readings from Last 3 Encounters:  11/20/20 71  11/19/20 86  06/27/20 65   Wt Readings from Last 3 Encounters:  11/20/20 (!) 375 lb (170.1 kg)  11/19/20 (!) 374 lb (169.6 kg)  06/27/20 (!) 383 lb (173.7 kg)    Assessment/Interventions: Review of patient past medical history, allergies, medications, health status, including review of consultants reports, laboratory and other test data, was performed as part of comprehensive evaluation and provision of chronic care management services.   SDOH:  (Social Determinants of Health) assessments and interventions performed: Yes SDOH Interventions   Flowsheet Row Most Recent Value  SDOH Interventions   Financial Strain Interventions Other (Comment)  [PAP]      CCM Care Plan  No Known Allergies  Medications Reviewed Today    Reviewed by Raelene Bott, [place] Rocker (Certified Medical Assistant) on 11/20/20 at [phone]  Med List Status: <None>  Medication Order Taking? Sig Documenting Provider Last Dose Status Informant  albuterol (PROVENTIL HFA;VENTOLIN HFA) 108 (90 BASE) MCG/ACT inhaler [id number] Yes Inhale 2 puffs into the lungs every 6 (six) hours as needed for wheezing or shortness of breath. [provider] Taking Active Self  allopurinol (ZYLOPRIM) 100 MG tablet [id number] Yes TAKE 1 TABLET TWICE DAILY Sowles, Drue Stager, MD Taking Active   Blood Glucose Calibration (TRUE METRIX LEVEL 1) Low SOLN [id number] Yes 1 each by In Vitro route once a week. Steele Sizer, MD Taking Active   Blood Glucose Monitoring Suppl (TRUE METRIX AIR GLUCOSE METER) w/Device KIT [id number] Yes Inject 1 each into the skin 4 (four) times daily as needed. Check fsbs  4 times daily E11.22 Steele Sizer, MD Taking Active   Cholecalciferol (VITAMIN D) 2000 UNITS tablet [id number] Yes Take 2,000 Units by mouth daily. [provider] Taking Active Self  colchicine (COLCRYS) 0.6 MG tablet [id number] Yes Take 1-2 tablets (0.6-1.2 mg total) by mouth daily as needed. Steele Sizer, MD Taking Active   diltiazem (CARDIZEM CD) 300 MG 24 hr capsule [id number] Yes TAKE 1 CAPSULE EVERY Eloise Harman, MD Taking Active   DROPLET PEN NEEDLES 30G X 8 MM MISC [id number] Yes USE TWICE DAILY WITH INSULIN Steele Sizer, MD Taking Active   Dulaglutide (TRULICITY) 1.5 TD/4.2AJ SOPN [id number] Yes Inject 1.5 mg into the skin once a week. Steele Sizer, MD Taking Active   Elastic Bandages & Supports (  MEDICAL COMPRESSION STOCKINGS) [place] [id number] Yes 2 each by Does not apply route daily. Steele Sizer, MD Taking Active Self  ELIQUIS 5 MG TABS tablet [id number] Yes TAKE 1 TABLET TWICE DAILY Wellington Hampshire, MD Taking Active   fluticasone (FLONASE) 50 MCG/ACT nasal spray [id number] Yes USE 2 SPRAYS IN EACH NOSTRIL EVERY Eloise Harman, MD Taking Active   Fluticasone-Salmeterol (ADVAIR) 250-50 MCG/DOSE AEPB [id number] Yes INHALE 1 DOSE BY MOUTH TWICE DAILY Sowles, Drue Stager, MD Taking Active   furosemide (LASIX) 40 MG tablet [id number] Yes TAKE 1 TABLET (40 MG TOTAL) BY MOUTH 2 (TWO) TIMES DAILY AS NEEDED. Steele Sizer, MD Taking Active   Insulin Lispro Prot & Lispro (HUMALOG MIX 75/25 KWIKPEN) (75-25) 100 UNIT/ML Claiborne Rigg [id number] Yes Inject 10 Units into the skin 2 (two) times daily with a meal. Steele Sizer, MD Taking Active   irbesartan (AVAPRO) 300 MG tablet [id number] Yes TAKE 1 TABLET EVERY DAY Steele Sizer, MD Taking Active   pravastatin (PRAVACHOL) 80 MG tablet [id number] Yes TAKE 1 TABLET (80 MG TOTAL) BY MOUTH EVERY EVENING. Steele Sizer, MD Taking Active   TRUE METRIX BLOOD GLUCOSE TEST test strip [id number] Yes CHECK BLOOD SUGAR FOUR TIMES DAILY Steele Sizer, MD Taking Active           Patient Active Problem List   Diagnosis Date Noted  . Hypertension associated with stage 3 chronic kidney disease due to type 2 diabetes mellitus ([place]) 02/06/2020  . History of colonic polyps   . Polyp of colon   . Thoracic aortic atherosclerosis ([place]) 12/30/2016  . Controlled type 2 diabetes mellitus with stage 3 chronic kidney disease ([place]) 12/23/2016  . Venous insufficiency 05/21/2016  . Hypertensive heart disease 05/21/2016  . Anemia in chronic kidney disease 12/22/2015  . Pulmonary hypertension ([place]) 12/18/2015  . 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 Anxiety 04/12/2015  . Chronic kidney disease (CKD), stage III (moderate) ([place]) 04/12/2015  . Edema extremities 04/12/2015  . 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 Asthma, moderate persistent 04/12/2015  . Morbid obesity, unspecified obesity type ([place]) 04/12/2015  . Obstructive apnea 04/12/2015  . Restless leg 04/12/2015  . Allergic rhinitis 04/12/2015  . Vitamin D deficiency 04/12/2015  . Controlled gout 04/12/2015  . Premature atrial contractions 11/29/2013  . Atrial fibrillation ([place]) 11/09/2013  . Benign essential hypertension   . Hyperlipidemia     Immunization History  Administered Date(s) Administered  . Fluad Quad(high Dose 65+) 05/14/2019, 05/12/2020  . Influenza Split 05/16/2007  . Influenza, High Dose Seasonal PF 06/24/2017, 04/25/2018  . Influenza, Seasonal, Injecte, Preservative Fre 05/07/2010, 04/27/2011  . Influenza,inj,Quad PF,6+ Mos 08/02/2014, 04/15/2015, 05/22/2016  . Influenza-Unspecified 08/02/2014  . Moderna Sars-Covid-2 Vaccination 09/28/2019, 10/26/2019, 07/02/2020  . Pneumococcal Conjugate-13 08/02/2014  . Pneumococcal Polysaccharide-23 04/27/2011, 06/04/2016  . Tdap 09/29/2012  . Zoster 09/29/2012    Conditions to be addressed/monitored:  Hypertension, Hyperlipidemia, Diabetes, Atrial Fibrillation, Asthma, Chronic Kidney Disease and Gout  Care Plan :  General Pharmacy (Adult)  Updates made by Germaine Pomfret, RPH since 12/17/2020 12:00 AM    Problem: Hypertension, Hyperlipidemia, Diabetes, Atrial Fibrillation, Asthma, Chronic Kidney Disease and Gout   Priority: High    Long-Range Goal: Patient-Specific Goal   Start Date: 10/29/2020  Expected End Date: 05/01/2021  This Visit's Progress: On track  Recent Progress: On track  Priority: High  Note:   Current Barriers:  . Unable to independently afford treatment regimen . Suboptimal therapeutic regimen for diabetes  Pharmacist Clinical Goal(s):  Marland Kitchen Over the  next 90 days, patient will maintain control of diabetes as evidenced by A1c less than 8%  through collaboration with PharmD and provider.   Interventions: . 1:1 collaboration with Steele Sizer, MD regarding development and update of comprehensive plan of care as evidenced by provider attestation and co-signature . Inter-disciplinary care team collaboration (see longitudinal plan of care) . Comprehensive medication review performed; medication list updated in electronic medical record  Hypertension (BP goal <140/90) -Not Ideally Controlled -Current treatment: . Diltiazem CD 300 mg daily  . Furosemide 40 mg twice daily as needed (uses every other day)  . Irbersartan 300 mg daily  -Medications previously tried: NA  -Current home readings:   153/73 HR 79   144/65 HR 65  144/63 HR 61  140/61 HR 65  -Current dietary habits: Since starting Trulicity, patient's diet has been much lessened. She has been eating less, trying to follow a low-carb, low-sodium diet.   -Current exercise habits: Unable to walk long distances.  -Denies hypotensive/hypertensive symptoms -Educated on Daily salt intake goal < 2300 mg; Exercise goal of 150 minutes per week; -Counseled to monitor BP at home 2-3 times weekly, document, and provide log at future appointments -Recommended to continue current medication  Hyperlipidemia: (LDL goal <  70) -Controlled -Current treatment: . Pravastatin 80 mg daily  -Medications previously tried: NA  -Educated on Importance of limiting foods high in cholesterol; -Recommended to continue current medication  Diabetes (A1c goal <8%) -Controlled -Current medications: . Trulicity 1.5 mg weekly  . Humalog 75-25 20 units before breakfast, 10 units before supper  -Medications previously tried: NA  - Patient found that her previous doses of insulin were causing her to have significant lows given her appetite changes, so she has been giving reduced insulin amounts as discussed with CPP or skipping her insulin entirely. -Current home glucose readings: Blood sugar (units of insulin given)  Breakfast Pre-dinner Bedtime  27-Apr 97    26-Apr 102 125   25-Apr 105 86   24-Apr 140  125  23-Apr 142 136   22-Apr 114 150   20-Apr 146 109   19-Apr 132 163   18-Apr 130 128   17-Apr 155 145   16-Apr 125 105   Average 126 127    -Denies hypoglycemic symptoms. -Educated on A1c and blood sugar goals; Prevention and management of hypoglycemic episodes; -Counseled to check feet daily and get yearly eye exams -Recommend Farxiga 5 mg daily at next follow-up for CKD / heart failure protection  Atrial Fibrillation (Goal: prevent stroke and major bleeding) -Controlled -CHADSVASC: 5 -Current treatment: . Rate control: Diltiazem CD 300 mg daily  . Anticoagulation: Eliquis 5 mg twice daily  -Medications previously tried: NA - Eliquis PAP submitted by Dr. Tyrell Antonio office on 10/27/20. -Home BP and HR readings: NA  -Counseled on avoidance of NSAIDs due to increased bleeding risk with anticoagulants; -Recommended to continue current medication  Asthma (Goal: control symptoms and prevent exacerbations) -Controlled -Current treatment  . Proventil HFA 2 puffs every 6 hours as needed . Advair 1 puff twice daily  -Medications previously tried: NA  -Exacerbations requiring treatment in last 6 months:  None -Patient reports consistent use of maintenance inhaler -Frequency of rescue inhaler use: NA -Counseled on When to use rescue inhaler -Recommended to continue current medication  Patient Goals/Self-Care Activities . Over the next 90 days, patient will:  - check glucose 3 times daily, before breakfast, supper, and bedtime, document, and provide at future appointments check blood pressure 2-3 times  weekly, document, and provide at future appointments  Follow Up Plan: Telephone follow up appointment with care management team member scheduled for:  02/11/2021 at 9:00 AM      Medication Assistance: Trulicity, Humalog obtained through [employer] medication assistance program.  Enrollment ends Dec 2022 Eliquis patient assistance is still pending. Patient denied until able to show out of pocket spending report.   Patient's preferred pharmacy is:  [facility], [place] [address] Phone: [phone] Fax: [phone]  [facility] [address] [place] - [place] [address] [place] [address] Phone: [phone] Fax: [phone]  [facility] [place], [place] - [address] [place] [address] Phone: [phone] Fax: [phone]  Uses pill box? Yes Pt endorses 100% compliance  We discussed: Current pharmacy is preferred with insurance plan and patient is satisfied with pharmacy services Patient decided to: Continue current medication management strategy  Care Plan and Follow Up Patient Decision:  Patient agrees to Care Plan and Follow-up.  Plan: Telephone follow up appointment with care management team member scheduled for:  02/11/2021 at 9:00 AM  [facility] [phone]

## 2021-02-10 ENCOUNTER — Telehealth: Payer: Self-pay

## 2021-02-10 NOTE — Progress Notes (Signed)
Spoke to patient to confirmed patient telephone appointment on 02/11/2021 for CCM at 9:00 am with [name] the Clinical pharmacist.   Patient verbalized understanding. Patient aware to have all medications, supplements, blood pressure and blood sugar logs to visit.  Questions: Have you had any recent office visit or specialist visit outside of [place]?No Are there any concerns you would like to discuss during your office visit?No Are you having any problems obtaining your medications? No If patient has any PAP medications ask if they are having any problems getting their PAP medication or refill?None ID   Star Rating Drug: Irbesartan 300 mg last filled on 06/12/2020 for 90 day supply at [facility]. Pravastatin 80 mg last filled on 06/12/2020 for 90 day supply at [facility]. Trulicity 1.5 mg last filled on 08/30/2018 for 84 day supply at [facility].  Any gaps in medications fill history? Irbesartan 300 mg last filled on 06/12/2020 for 90 day supply at [facility]. Pravastatin 80 mg last filled on 06/12/2020 for 90 day supply at [facility]. Trulicity 1.5 mg last filled on 08/30/2018 for 84 day supply at [facility].  [place] Pharmacist Assistant [phone]

## 2021-02-11 ENCOUNTER — Ambulatory Visit (INDEPENDENT_AMBULATORY_CARE_PROVIDER_SITE_OTHER): Payer: Medicare PPO

## 2021-02-11 DIAGNOSIS — E1122 Type 2 diabetes mellitus with diabetic chronic kidney disease: Secondary | ICD-10-CM

## 2021-02-11 DIAGNOSIS — I152 Hypertension secondary to endocrine disorders: Secondary | ICD-10-CM

## 2021-02-11 DIAGNOSIS — Z794 Long term (current) use of insulin: Secondary | ICD-10-CM

## 2021-02-11 DIAGNOSIS — E1159 Type 2 diabetes mellitus with other circulatory complications: Secondary | ICD-10-CM | POA: Diagnosis not present

## 2021-02-11 DIAGNOSIS — N183 Chronic kidney disease, stage 3 unspecified: Secondary | ICD-10-CM | POA: Diagnosis not present

## 2021-02-11 NOTE — Progress Notes (Signed)
Chronic Care Management Pharmacy Note  02/11/2021 Name:  Amber Reyes MRN:  [id number] DOB:  1946/04/22  Summary: Patient is doing well overall. She recently celebrated her birthday and mother's day with her family and was worried her blood sugars might be higher than normal. Blood sugars still well controlled, with one instance of mild hypoglycemia symptoms due to her skipping a meal. Her home blood pressure readings are borderline controlled, with some elevated readings in the mornings prior to taking her AM blood pressure medications.   Recommendations/Changes made from today's visit: -Recommend Farxiga 5 mg daily at next follow-up for CKD / heart failure protection  Plan: PCP follow-up in 1 month CPP follow-up in 2 months   Subjective: Amber Reyes is an 75 y.o. year old female who is a primary patient of Steele Sizer, MD.  The CCM team was consulted for assistance with disease management and care coordination needs.    Engaged with patient by telephone for follow up visit in response to provider referral for pharmacy case management and/or care coordination services.   Consent to Services:  The patient was given information about Chronic Care Management services, agreed to services, and gave verbal consent prior to initiation of services.  Please see initial visit note for detailed documentation.   Patient Care Team: Steele Sizer, MD as PCP - General (Family Medicine) Wellington Hampshire, MD as PCP - Cardiology (Cardiology) Wellington Hampshire, MD as Consulting Physician (Cardiology) Mottl, Andrez Grime, MD as Referring Physician (Nephrology) Paulla Dolly Tamala Fothergill, DPM as Consulting Physician (Podiatry) Germaine Pomfret, [facility] as Pharmacist (Pharmacist)  Recent office visits: 11/19/20: Patient presented to Dr. Ancil Boozer for follow-up. A1c 6.8%. BP 144/70. Humalog 75/25 10 units twice daily  06/19/20: Patient presented to Clemetine Marker, LPN for AWV.   Recent consult  visits: 11/20/20: Patient presented to Dr. Fletcher Anon (Cardiology) for follow-up. BP 150/58. No medication changes made.  11/12/20: Patient presented to Sherlynn Stalls, ANP (Nephrology) for follow-up. BP 175/73.   Hospital visits: None in previous 6 months  Objective:  Lab Results  Component Value Date   CREATININE 1.29 (H) 05/12/2020   BUN 28 (H) 05/12/2020   GFRNONAA 41 (L) 05/12/2020   GFRAA 48 (L) 05/12/2020   NA 138 05/12/2020   K 4.2 05/12/2020   CALCIUM 9.0 05/12/2020   CO2 29 05/12/2020    Lab Results  Component Value Date/Time   HGBA1C 6.8 (A) 11/19/2020 08:02 AM   HGBA1C 6.9 (A) 05/12/2020 08:38 AM   HGBA1C 6.8 (H) 05/14/2019 12:00 AM   HGBA1C 6.5 01/01/2019 07:48 AM   HGBA1C 6.6 08/30/2018 07:46 AM   HGBA1C 6.6 (A) 08/30/2018 07:46 AM   HGBA1C 6.6 08/30/2018 07:46 AM   MICROALBUR 5.4 05/14/2019 12:00 AM   MICROALBUR 5.7 04/25/2018 09:29 AM   MICROALBUR 20 08/15/2015 08:12 AM    Last diabetic Eye exam:  Lab Results  Component Value Date/Time   HMDIABEYEEXA No Retinopathy 08/18/2020 12:00 AM    Last diabetic Foot exam: No results found for: HMDIABFOOTEX   Lab Results  Component Value Date   CHOL 115 05/12/2020   HDL 47 (L) 05/12/2020   LDLCALC 52 05/12/2020   TRIG 82 05/12/2020   CHOLHDL 2.4 05/12/2020    Hepatic Function Latest Ref Rng & Units 05/12/2020 05/14/2019 11/25/2017  Total Protein 6.1 - 8.1 g/dL 6.8 6.6 6.4  Albumin 3.6 - 5.1 g/dL - - -  AST 10 - 35 U/L '15 11 13  ' ALT 6 - 29  U/L 8 5(L) 7  Alk Phosphatase 33 - 130 U/L - - -  Total Bilirubin 0.2 - 1.2 mg/dL 0.5 0.5 0.3  Bilirubin, Direct 0.0 - 0.2 mg/dL - - 0.0    No results found for: TSH, FREET4  CBC Latest Ref Rng & Units 11/19/2020 05/12/2020 05/14/2019  WBC 3.8 - 10.8 Thousand/uL 7.6 8.6 9.3  Hemoglobin 11.7 - 15.5 g/dL 10.2(L) 10.3(L) 10.2(L)  Hematocrit 35.0 - 45.0 % 31.9(L) 31.7(L) 31.9(L)  Platelets 140 - 400 Thousand/uL 225 229 227    Lab Results  Component Value Date/Time   VD25OH 45  05/12/2020 08:59 AM   VD25OH 48 05/14/2019 12:00 AM    Clinical ASCVD: Yes  The ASCVD Risk score Mikey Bussing DC Jr., et al., 2013) failed to calculate for the following reasons:   The valid total cholesterol range is 130 to 320 mg/dL    Depression screen [facility] 2/9 11/19/2020 06/19/2020 05/12/2020  Decreased Interest 0 0 0  Down, Depressed, Hopeless 0 0 0  PHQ - 2 Score 0 0 0  Altered sleeping - - 0  Tired, decreased energy - - 2  Change in appetite - - 0  Feeling bad or failure about yourself  - - 0  Trouble concentrating - - 0  Moving slowly or fidgety/restless - - 0  Suicidal thoughts - - 0  PHQ-9 Score - - 2  Difficult doing work/chores - - Not difficult at all  Some recent data might be hidden    Social History   Tobacco Use  Smoking Status Never  Smokeless Tobacco Never  Tobacco Comments   n/a   BP Readings from Last 3 Encounters:  11/20/20 (!) 150/58  11/19/20 (!) 144/70  06/27/20 (!) 161/59   Pulse Readings from Last 3 Encounters:  11/20/20 71  11/19/20 86  06/27/20 65   Wt Readings from Last 3 Encounters:  11/20/20 (!) 375 lb (170.1 kg)  11/19/20 (!) 374 lb (169.6 kg)  06/27/20 (!) 383 lb (173.7 kg)    Assessment/Interventions: Review of patient past medical history, allergies, medications, health status, including review of consultants reports, laboratory and other test data, was performed as part of comprehensive evaluation and provision of chronic care management services.   SDOH:  (Social Determinants of Health) assessments and interventions performed: Yes SDOH Interventions    Flowsheet Row Most Recent Value  SDOH Interventions   Financial Strain Interventions Other (Comment)  [PAP]        CCM Care Plan  No Known Allergies  Medications Reviewed Today     Reviewed by Germaine Pomfret, [facility] (Pharmacist) on 02/11/21 at 0944  Med List Status: <None>   Medication Order Taking? Sig Documenting Provider Last Dose Status Informant  albuterol  (PROVENTIL HFA;VENTOLIN HFA) 108 (90 BASE) MCG/ACT inhaler [id number]  Inhale 2 puffs into the lungs every 6 (six) hours as needed for wheezing or shortness of breath. [provider]  Active Self  allopurinol (ZYLOPRIM) 100 MG tablet [id number]  TAKE 1 TABLET TWICE DAILY Sowles, Drue Stager, MD  Active   Blood Glucose Calibration (TRUE METRIX LEVEL 1) Low SOLN [id number]  1 each by In Vitro route once a week. Steele Sizer, MD  Active   Blood Glucose Monitoring Suppl (TRUE METRIX AIR GLUCOSE METER) w/Device KIT [id number]  Inject 1 each into the skin 4 (four) times daily as needed. Check fsbs 4 times daily E11.22 Steele Sizer, MD  Active   Cholecalciferol (VITAMIN D) 2000 UNITS tablet [id number]  Take 2,000 Units by  mouth daily. [provider]  Active Self  colchicine (COLCRYS) 0.6 MG tablet [id number]  Take 1-2 tablets (0.6-1.2 mg total) by mouth daily as needed. Steele Sizer, MD  Active   diltiazem (CARDIZEM CD) 300 MG 24 hr capsule [id number]  TAKE 1 CAPSULE EVERY DAY Sowles, Drue Stager, MD  Active   DROPLET PEN NEEDLES 30G X 8 MM MISC [id number]  USE TWICE DAILY WITH INSULIN Ancil Boozer, Drue Stager, MD  Active   Dulaglutide (TRULICITY) 1.5 OB/0.9GG SOPN [id number]  Inject 1.5 mg into the skin once a week. Steele Sizer, MD  Active   Elastic Bandages & Supports (Zap) [place] [id number]  2 each by Does not apply route daily. Steele Sizer, MD  Active Self  ELIQUIS 5 MG TABS tablet [id number]  TAKE 1 TABLET TWICE DAILY Wellington Hampshire, MD  Active   fluticasone (FLONASE) 50 MCG/ACT nasal spray [id number]  Place 2 sprays into both nostrils daily. Steele Sizer, MD  Active   Fluticasone-Salmeterol (ADVAIR) 250-50 MCG/DOSE AEPB [id number]  INHALE 1 DOSE BY MOUTH TWICE DAILY Steele Sizer, MD  Active   furosemide (LASIX) 40 MG tablet [id number]  TAKE 1 TABLET (40 MG TOTAL) BY MOUTH 2 (TWO) TIMES DAILY AS NEEDED. Steele Sizer, MD  Active   Insulin Lispro Prot & Lispro  (HUMALOG MIX 75/25 KWIKPEN) (75-25) 100 UNIT/ML Claiborne Rigg [id number] Yes Inject 10 Units into the skin 2 (two) times daily with a meal. Steele Sizer, MD Taking Active   irbesartan (AVAPRO) 300 MG tablet [id number]  TAKE 1 TABLET EVERY DAY Steele Sizer, MD  Active   pravastatin (PRAVACHOL) 80 MG tablet [id number]  TAKE 1 TABLET (80 MG TOTAL) BY MOUTH EVERY EVENING. 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 ([place]) 02/06/2020   History of colonic polyps    Polyp of colon    Thoracic aortic atherosclerosis ([place]) 12/30/2016   Controlled type 2 diabetes mellitus with stage 3 chronic kidney disease ([place]) 12/23/2016   Venous insufficiency 05/21/2016   Hypertensive heart disease 05/21/2016   Anemia in chronic kidney disease 12/22/2015   Pulmonary hypertension ([place]) 12/18/2015   Xanthelasma 04/15/2015   Anxiety 04/12/2015   Chronic kidney disease (CKD), stage III (moderate) (HCC) 04/12/2015   Edema extremities 04/12/2015   Disc disorder of lumbar region 04/12/2015   Asthma, moderate persistent 04/12/2015   Morbid obesity, unspecified obesity type ([place]) 04/12/2015   Obstructive apnea 04/12/2015   Restless leg 04/12/2015   Allergic rhinitis 04/12/2015   Vitamin D deficiency 04/12/2015   Controlled gout 04/12/2015   Premature atrial contractions 11/29/2013   Atrial fibrillation ([place]) 11/09/2013   Benign essential hypertension    Hyperlipidemia     Immunization History  Administered Date(s) Administered   Fluad Quad(high Dose 65+) 05/14/2019, 05/12/2020   Influenza Split 05/16/2007   Influenza, High Dose Seasonal PF 06/24/2017, 04/25/2018   Influenza, Seasonal, Injecte, Preservative Fre 05/07/2010, 04/27/2011    Influenza,inj,Quad PF,6+ Mos 08/02/2014, 04/15/2015, 05/22/2016   Influenza-Unspecified 08/02/2014   Moderna Sars-Covid-2 Vaccination 09/28/2019, 10/26/2019, 07/02/2020   Pneumococcal Conjugate-13 08/02/2014   Pneumococcal Polysaccharide-23 04/27/2011, 06/04/2016   Tdap 09/29/2012   Zoster, Live 09/29/2012    Conditions to be addressed/monitored:  Hypertension,  Hyperlipidemia, Diabetes, Atrial Fibrillation, Asthma, Chronic Kidney Disease and Gout  Care Plan : General Pharmacy (Adult)  Updates made by Germaine Pomfret, RPH since 02/11/2021 12:00 AM     Problem: Hypertension, Hyperlipidemia, Diabetes, Atrial Fibrillation, Asthma, Chronic Kidney Disease and Gout   Priority: High     Long-Range Goal: Patient-Specific Goal   Start Date: 10/29/2020  Expected End Date: 05/01/2021  Recent Progress: On track  Priority: High  Note:   Current Barriers:  Unable to independently afford treatment regimen Suboptimal therapeutic regimen for diabetes  Pharmacist Clinical Goal(s):  Over the next 90 days, patient will maintain control of diabetes as evidenced by A1c less than 8%  through collaboration with PharmD and provider.   Interventions: 1:1 collaboration with Steele Sizer, MD regarding development and update of comprehensive plan of care as evidenced by provider attestation and co-signature Inter-disciplinary care team collaboration (see longitudinal plan of care) Comprehensive medication review performed; medication list updated in electronic medical record  Hypertension (BP goal <140/90) -Controlled -Current treatment: Diltiazem CD 300 mg daily  Furosemide 40 mg twice daily as needed (uses every other day)  Irbersartan 300 mg daily  -Medications previously tried: NA  -Current home readings (Prior to medication):    135/75 140/78 154/74 139/67 150/62 138/61  -Current dietary habits: She has been eating less, trying to follow a low-carb, low-sodium diet.   -Current exercise  habits: Unable to walk long distances.  -Denies hypotensive/hypertensive symptoms -Recommended to continue current medication  Hyperlipidemia: (LDL goal < 70) -Controlled -Current treatment: Pravastatin 80 mg daily  -Medications previously tried: NA  -Educated on Importance of limiting foods high in cholesterol; -Recommended to continue current medication  Diabetes (A1c goal <8%) -Controlled -Current medications: Trulicity 1.5 mg weekly  Humalog 75-25 10 units before breakfast, 10 units before supper  -Medications previously tried: NA  -Current home glucose readings:  Fasting Pre-Dinner  22-Jun 112   21-Jun 133 127  20-Jun 133 95  19-Jun 140 102  18-Jun 140 101  17-Jun 117   16-Jun 127   15-Jun 120 123  14-Jun 122 88  13-Jun 129   Average 127 106   -Reports hypoglycemic symptoms: Felt nervous. One instance where she had a later meal than normal and was more active.   -Educated on A1c and blood sugar goals; Prevention and management of hypoglycemic episodes; -Counseled to check feet daily and get yearly eye exams -Recommend Farxiga 5 mg daily at next follow-up for CKD / heart failure protection  Atrial Fibrillation (Goal: prevent stroke and major bleeding) -Controlled -CHADSVASC: 5 -Current treatment: Rate control: Diltiazem CD 300 mg daily  Anticoagulation: Eliquis 5 mg twice daily  -Medications previously tried: NA - Eliquis PAP submitted by Dr. Tyrell Antonio office on 10/27/20. -Home BP and HR readings: NA  -Counseled on avoidance of NSAIDs due to increased bleeding risk with anticoagulants; -Recommended to continue current medication  Asthma (Goal: control symptoms and prevent exacerbations) -Controlled -Current treatment  Proventil HFA 2 puffs every 6 hours as needed Advair 1 puff twice daily  -Medications previously tried: NA  -Exacerbations requiring treatment in last 6 months: None -Patient reports consistent use of maintenance inhaler -Frequency of rescue  inhaler use: NA -Counseled on When to use rescue inhaler -Recommended to continue current medication  Patient Goals/Self-Care Activities Over the next 90 days, patient will:  - check glucose 3 times daily, before breakfast, supper, and bedtime, document, and provide at future appointments check blood pressure 2-3 times weekly, document, and provide at  future appointments  Follow Up Plan: Telephone follow up appointment with care management team member scheduled for:  04/15/2021 at 8:15 AM       Medication Assistance:  Trulicity, Humalog obtained through [employer] medication assistance program.  Enrollment ends Dec 2022 Eliquis patient assistance is still pending. Patient denied until able to show out of pocket spending report.   Patient's preferred pharmacy is:  [facility] ([facility] - [employer], [place] [address] Phone: [phone] Fax: [phone]  [facility] [address] [place] - [place] [address] [place] [address] Phone: [phone] Fax: [phone]  [facility] [place], [place] - [address] [place] [address] Phone: [phone] Fax: [phone]  Uses pill box? Yes Pt endorses 100% compliance  We discussed: Current pharmacy is preferred with insurance plan and patient is satisfied with pharmacy services Patient decided to: Continue current medication management strategy  Care Plan and Follow Up Patient Decision:  Patient agrees to Care Plan and Follow-up.  Plan: Telephone follow up appointment with care management team member scheduled for:  04/15/2021 at [facility] [phone]

## 2021-02-11 NOTE — Patient Instructions (Addendum)
Visit Information It was great speaking with you today!  Please let me know if you have any questions about our visit.   Goals Addressed             This Visit's Progress    Monitor and Manage My Blood Sugar-Diabetes Type 2   On track    Timeframe:  Long-Range Goal Priority:  High Start Date:  10/29/2020                           Expected End Date:  05/01/2021                     Follow Up Date 02/11/2021    -check blood sugar three times daily: Before breakfast, before supper, and at bedtime - check blood sugar if I feel it is too high or too low - enter blood sugar readings and medication or insulin into daily log    Why is this important?   Checking your blood sugar at home helps to keep it from getting very high or very low.  Writing the results in a diary or log helps the doctor know how to care for you.  Your blood sugar log should have the time, date and the results.  Also, write down the amount of insulin or other medicine that you take.  Other information, like what you ate, exercise done and how you were feeling, will also be helpful.     Notes:       Track and Manage My Blood Pressure-Hypertension   On track    Timeframe:  Long-Range Goal Priority:  High Start Date:  12/17/2020                            Expected End Date:  06/18/2021                    Follow Up Date 04/15/2021    - check blood pressure 3 times per week    Why is this important?   You won't feel high blood pressure, but it can still hurt your blood vessels.  High blood pressure can cause heart or kidney problems. It can also cause a stroke.  Making lifestyle changes like losing a little weight or eating less salt will help.  Checking your blood pressure at home and at different times of the day can help to control blood pressure.  If the doctor prescribes medicine remember to take it the way the doctor ordered.  Call the office if you cannot afford the medicine or if there are questions about it.      Notes:          Patient Care Plan: General Pharmacy (Adult)     Problem Identified: Hypertension, Hyperlipidemia, Diabetes, Atrial Fibrillation, Asthma, Chronic Kidney Disease and Gout   Priority: High     Long-Range Goal: Patient-Specific Goal   Start Date: 10/29/2020  Expected End Date: 05/01/2021  This Visit's Progress: On track  Recent Progress: On track  Priority: High  Note:   Current Barriers:  Unable to independently afford treatment regimen Suboptimal therapeutic regimen for diabetes  Pharmacist Clinical Goal(s):  Over the next 90 days, patient will maintain control of diabetes as evidenced by A1c less than 8%  through collaboration with PharmD and provider.   Interventions: 1:1 collaboration with Steele Sizer, MD regarding development and update of comprehensive  plan of care as evidenced by provider attestation and co-signature Inter-disciplinary care team collaboration (see longitudinal plan of care) Comprehensive medication review performed; medication list updated in electronic medical record  Hypertension (BP goal <140/90) -Controlled -Current treatment: Diltiazem CD 300 mg daily  Furosemide 40 mg twice daily as needed (uses every other day)  Irbersartan 300 mg daily  -Medications previously tried: NA  -Current home readings (Prior to medication):    135/75 140/78 154/74 139/67 150/62 138/61  -Current dietary habits: She has been eating less, trying to follow a low-carb, low-sodium diet.   -Current exercise habits: Unable to walk long distances.  -Denies hypotensive/hypertensive symptoms -Recommended to continue current medication  Hyperlipidemia: (LDL goal < 70) -Controlled -Current treatment: Pravastatin 80 mg daily  -Medications previously tried: NA  -Educated on Importance of limiting foods high in cholesterol; -Recommended to continue current medication  Diabetes (A1c goal <8%) -Controlled -Current medications: Trulicity 1.5 mg  weekly  Humalog 75-25 10 units before breakfast, 10 units before supper  -Medications previously tried: NA  -Current home glucose readings:  Fasting Pre-Dinner  22-Jun 112   21-Jun 133 127  20-Jun 133 95  19-Jun 140 102  18-Jun 140 101  17-Jun 117   16-Jun 127   15-Jun 120 123  14-Jun 122 88  13-Jun 129   Average 127 106   -Reports hypoglycemic symptoms: Felt nervous. One instance where she had a later meal than normal and was more active.   -Educated on A1c and blood sugar goals; Prevention and management of hypoglycemic episodes; -Counseled to check feet daily and get yearly eye exams -Recommend Farxiga 5 mg daily at next follow-up for CKD / heart failure protection  Atrial Fibrillation (Goal: prevent stroke and major bleeding) -Controlled -CHADSVASC: 5 -Current treatment: Rate control: Diltiazem CD 300 mg daily  Anticoagulation: Eliquis 5 mg twice daily  -Medications previously tried: NA - Eliquis PAP submitted by Dr. Tyrell Antonio office on 10/27/20. -Home BP and HR readings: NA  -Counseled on avoidance of NSAIDs due to increased bleeding risk with anticoagulants; -Recommended to continue current medication  Asthma (Goal: control symptoms and prevent exacerbations) -Controlled -Current treatment  Proventil HFA 2 puffs every 6 hours as needed Advair 1 puff twice daily  -Medications previously tried: NA  -Exacerbations requiring treatment in last 6 months: None -Patient reports consistent use of maintenance inhaler -Frequency of rescue inhaler use: NA -Counseled on When to use rescue inhaler -Recommended to continue current medication  Patient Goals/Self-Care Activities Over the next 90 days, patient will:  - check glucose 3 times daily, before breakfast, supper, and bedtime, document, and provide at future appointments check blood pressure 2-3 times weekly, document, and provide at future appointments  Follow Up Plan: Telephone follow up appointment with care management  team member scheduled for:  04/15/2021 at 8:15 AM      Patient agreed to services and verbal consent obtained.   Patient verbalizes understanding of instructions provided today and agrees to view in [place].   Doristine Section, [facility] [phone]

## 2021-02-19 ENCOUNTER — Telehealth: Payer: Self-pay

## 2021-02-19 NOTE — Telephone Encounter (Signed)
Copied from [place] [phone]. Topic: General - Other >> Feb 19, 2021  3:06 PM Pawlus, Brayton Layman A wrote: Reason for CRM: Pt requested a call back from [facility] regarding her blood sugar.

## 2021-03-04 ENCOUNTER — Telehealth: Payer: Self-pay

## 2021-03-06 NOTE — Progress Notes (Signed)
Name: Amber Reyes   MRN: [id number]    DOB: 02-May-1946   Date:03/09/2021       Progress Note  Subjective  Chief Complaint  Follow Up  HPI  DMII controlled: She has been compliant with medication, she is on  Humalog 75/25 since  Feb 2018, also started on Trulicity in 7846 [id number] was 6.5%  6.7% ,6.9%, 6.8 % today is up to 7 % again  Taking Avapro for microalbuminuria,  she is on statin for dyslipidemia, she has diabetes associated with obesity. FSB at home has been 103-143. No polyphagia, polydipsia or polyuria. She has lost 10 lbs since last visit, she states she is trying to lose weight.    HTN/CHF/Afib/Pulmonary hypertension : taking medications and denies side effects of medications, bp today was slightly up , she brought her bp logs and it is usually under control at home mostly in the 130's-60-70's She denies chest pain no recent palpitation, found to have Afib on EKG done by Dr. Fletcher Anon and takes Cardizem for rate control   She has orthopnea - uses two pillows , SOB is stable.  Echo done 06/2018 LV function is normal and unable to assess diastolic dysfunction, severe left  atrium dilation , mild elevation of Pulmonary pressure. She takes Eliquis and has been compliant with medication   Hyperlipidemia: taking Pravastatin, we will continue current medication , reviewed labs done 04/2020 LDL was at goal at 52   RLS: symptoms are still present, described as soreness on both legs at night, improves when she rubs her legs. She states lower extremity edema has been worse, left worse than right, she states lasix causes a lot of cramping, she told me she has not been taking potassium with lasix. Advised to take it daily and add potassium to see if symptoms will improve    CKIII: she is going to see nephrologist at [facility], last labs reviewed still stage III CKI , GFR stable, last level done 10/2020 was 51 , no itching , good urine output, she is not taking potassium because she had hyperkalemia - but  avoiding lasix, advised to take one potassium daily when she takes lasix  , she has secondary hyperparathyroidism    OSA: she was wearing CPAP machine every night, she finally got a new machine, wakes up feeling rested, but has dry mouth, she has humidifier    Morbid Obesity: she gets Trulicity through [employer] and she is losing weight, down another 10 lbs since last vsiit,  she is trying to avoid carbohydrates, eating more vegetables   Asthma Moderate persistent/Chronic bronchitis: . She is compliant and uses Advair twice daily as prescribed, no side effects. No wheezing , morning cough not as frequent now that she has been using a new CPAP machine, but still has  sob only with activity that is stable . Unchanged    Atherosclerosis of aorta and echo showed mild dilation of ascending aorta, continue statin and eliquis.  Chronic back pain: she cannot stand or walk for a long time because increases the pain, dull pain, no radiculitis. She states Tylenol controls symptoms . Unchanged   Anemia: normal iron studies. Anemia of chronic disease     Patient Active Problem List   Diagnosis Date Noted   Hypertension associated with stage 3 chronic kidney disease due to type 2 diabetes mellitus ([place]) 02/06/2020   History of colonic polyps    Polyp of colon    Thoracic aortic atherosclerosis ([place]) 12/30/2016   Controlled  type 2 diabetes mellitus with stage 3 chronic kidney disease ([place]) 12/23/2016   Venous insufficiency 05/21/2016   Hypertensive heart disease 05/21/2016   Anemia in chronic kidney disease 12/22/2015   Pulmonary hypertension ([place]) 12/18/2015   Xanthelasma 04/15/2015   Anxiety 04/12/2015   Chronic kidney disease (CKD), stage III (moderate) (HCC) 04/12/2015   Edema extremities 04/12/2015   Disc disorder of lumbar region 04/12/2015   Asthma, moderate persistent 04/12/2015   Morbid obesity, unspecified obesity type ([place]) 04/12/2015   Obstructive apnea 04/12/2015   Restless  leg 04/12/2015   Allergic rhinitis 04/12/2015   Vitamin D deficiency 04/12/2015   Controlled gout 04/12/2015   Premature atrial contractions 11/29/2013   Atrial fibrillation ([place]) 11/09/2013   Benign essential hypertension    Hyperlipidemia     Past Surgical History:  Procedure Laterality Date   CARDIAC CATHETERIZATION  2002   [facility]   COLONOSCOPY WITH PROPOFOL N/A 09/25/2018   Procedure: COLONOSCOPY WITH PROPOFOL;  Surgeon: Jonathon Bellows, MD;  Location: [facility] ENDOSCOPY;  Service: Gastroenterology;  Laterality: N/A;   COLONOSCOPY WITH PROPOFOL N/A 04/13/2019   Procedure: COLONOSCOPY WITH PROPOFOL;  Surgeon: Jonathon Bellows, MD;  Location: [facility] ENDOSCOPY;  Service: Gastroenterology;  Laterality: N/A;   COLONOSCOPY WITH PROPOFOL N/A 06/27/2020   Procedure: COLONOSCOPY WITH PROPOFOL;  Surgeon: Jonathon Bellows, MD;  Location: [facility] ENDOSCOPY;  Service: Gastroenterology;  Laterality: N/A;   PERICARDIUM SURGERY      Family History  Problem Relation Age of Onset   Heart attack Mother    Hypertension Mother    Breast cancer Neg Hx     Social History   Tobacco Use   Smoking status: Never   Smokeless tobacco: Never   Tobacco comments:    n/a  Substance Use Topics   Alcohol use: No    Alcohol/week: 0.0 standard drinks     Current Outpatient Medications:    albuterol (PROVENTIL HFA;VENTOLIN HFA) 108 (90 BASE) MCG/ACT inhaler, Inhale 2 puffs into the lungs every 6 (six) hours as needed for wheezing or shortness of breath., Disp: , Rfl:    allopurinol (ZYLOPRIM) 100 MG tablet, TAKE 1 TABLET TWICE DAILY, Disp: 180 tablet, Rfl: 1   Blood Glucose Calibration (TRUE METRIX LEVEL 1) Low SOLN, 1 each by In Vitro route once a week., Disp: 1 each, Rfl: 1   Blood Glucose Monitoring Suppl (TRUE METRIX AIR GLUCOSE METER) w/Device KIT, Inject 1 each into the skin 4 (four) times daily as needed. Check fsbs 4 times daily E11.22, Disp: 1 kit, Rfl: 0   Cholecalciferol (VITAMIN D) 2000 UNITS tablet, Take 2,000 Units  by mouth daily., Disp: , Rfl:    colchicine (COLCRYS) 0.6 MG tablet, Take 1-2 tablets (0.6-1.2 mg total) by mouth daily as needed., Disp: 30 tablet, Rfl: 0   diltiazem (CARDIZEM CD) 300 MG 24 hr capsule, TAKE 1 CAPSULE EVERY DAY, Disp: 90 capsule, Rfl: 1   DROPLET PEN NEEDLES 30G X 8 MM MISC, USE TWICE DAILY WITH INSULIN, Disp: 200 each, Rfl: 3   Dulaglutide (TRULICITY) 1.5 WE/9.9BZ SOPN, Inject 1.5 mg into the skin once a week., Disp: 6 mL, Rfl: 3   Elastic Bandages & Supports (MEDICAL COMPRESSION STOCKINGS) MISC, 2 each by Does not apply route daily., Disp: 2 each, Rfl: 2   ELIQUIS 5 MG TABS tablet, TAKE 1 TABLET TWICE DAILY, Disp: 180 tablet, Rfl: 3   fluticasone (FLONASE) 50 MCG/ACT nasal spray, Place 2 sprays into both nostrils daily., Disp: 48 g, Rfl: 2   Fluticasone-Salmeterol (ADVAIR)  250-50 MCG/DOSE AEPB, INHALE 1 DOSE BY MOUTH TWICE DAILY, Disp: 3 each, Rfl: 1   furosemide (LASIX) 40 MG tablet, TAKE 1 TABLET (40 MG TOTAL) BY MOUTH 2 (TWO) TIMES DAILY AS NEEDED., Disp: 180 tablet, Rfl: 1   Insulin Lispro Prot & Lispro (HUMALOG MIX 75/25 KWIKPEN) (75-25) 100 UNIT/ML Kwikpen, Inject 10 Units into the skin 2 (two) times daily with a meal., Disp: 45 mL, Rfl: 0   irbesartan (AVAPRO) 300 MG tablet, TAKE 1 TABLET EVERY DAY, Disp: 90 tablet, Rfl: 1   pravastatin (PRAVACHOL) 80 MG tablet, TAKE 1 TABLET (80 MG TOTAL) BY MOUTH EVERY EVENING., Disp: 90 tablet, Rfl: 1   TRUE METRIX BLOOD GLUCOSE TEST test strip, CHECK BLOOD SUGAR FOUR TIMES DAILY, Disp: 400 strip, Rfl: 0  No Known Allergies  I personally reviewed active problem list, medication list, allergies, family history, social history, health maintenance, notes from last encounter with the patient/caregiver today.   ROS  Constitutional: Negative for fever , positive for weight change.  Respiratory: Negative for cough but has  shortness of breath/stable .   Cardiovascular: Negative for chest pain or palpitations.  Gastrointestinal:  Negative for abdominal pain, no bowel changes.  Musculoskeletal: positive for gait problem and left ankle  joint swelling.  Skin: Negative for rash.  Neurological: Negative for dizziness or headache.  No other specific complaints in a complete review of systems (except as listed in HPI above).   Objective  Vitals:   03/09/21 0822  BP: (!) 142/68  Pulse: 73  Resp: 16  Temp: 97.8 F (36.6 C)  TempSrc: Oral  SpO2: 97%  Weight: (!) 365 lb (165.6 kg)  Height: _0  (1.778 m)    Body mass index is 52.37 kg/m.  Physical Exam  Constitutional: Patient appears well-developed and well-nourished. Obese  No distress.  HEENT: head atraumatic, normocephalic, pupils equal and reactive to light, neck supple Cardiovascular: Normal rate, regular rhythm and normal heart sounds.  No murmur heard. No BLE edema. Pulmonary/Chest: [place] normal and breath sounds normal. No respiratory distress. Abdominal: Soft.  There is no tenderness. Psychiatric: Patient has a normal mood and affect. behavior is normal. Judgment and thought content normal.   Recent Results (from the past 2160 hour(s))  POCT HgB A1C     Status: Abnormal   Collection Time: 03/09/21  8:03 AM  Result Value Ref Range   Hemoglobin A1C 7.0 (A) 4.0 - 5.6 %   HbA1c POC (<> result, manual entry)     HbA1c, POC (prediabetic range)     HbA1c, POC (controlled diabetic range)      PHQ2/9: Depression screen [facility] 2/9 03/09/2021 11/19/2020 06/19/2020 05/12/2020 02/06/2020  Decreased Interest 0 0 0 0 0  Down, Depressed, Hopeless 0 0 0 0 0  PHQ - 2 Score 0 0 0 0 0  Altered sleeping - - - 0 0  Tired, decreased energy - - - 2 0  Change in appetite - - - 0 0  Feeling bad or failure about yourself  - - - 0 0  Trouble concentrating - - - 0 0  Moving slowly or fidgety/restless - - - 0 0  Suicidal thoughts - - - 0 0  PHQ-9 Score - - - 2 0  Difficult doing work/chores - - - Not difficult at all Not difficult at all  Some recent data might be  hidden    phq 9 is negative   Fall Risk: Fall Risk  03/09/2021 11/19/2020 06/19/2020 05/12/2020 02/06/2020  Falls in the past year? 0 0 0 0 0  Number falls in past yr: 0 0 0 0 0  Injury with Fall? 0 0 0 0 0  Risk for fall due to : - - No Fall Risks - -  Follow up - - Falls prevention discussed - Falls evaluation completed      Functional Status Survey: Is the patient deaf or have difficulty hearing?: No Does the patient have difficulty seeing, even when wearing glasses/contacts?: No Does the patient have difficulty concentrating, remembering, or making decisions?: No Does the patient have difficulty walking or climbing stairs?: Yes Does the patient have difficulty dressing or bathing?: No Does the patient have difficulty doing errands alone such as visiting a doctor's office or shopping?: Yes    Assessment & Plan  1. Controlled type 2 diabetes mellitus with stage 3 chronic kidney disease, with long-term current use of insulin (HCC)  - POCT HgB A1C  2. Benign essential hypertension  - irbesartan (AVAPRO) 300 MG tablet; Take 1 tablet (300 mg total) by mouth daily.  Dispense: 90 tablet; Refill: 1 - diltiazem (CARDIZEM CD) 300 MG 24 hr capsule; Take 1 capsule (300 mg total) by mouth daily.  Dispense: 90 capsule; Refill: 1  3. Dyslipidemia associated with type 2 diabetes mellitus (HCC)  - pravastatin (PRAVACHOL) 80 MG tablet; Take 1 tablet (80 mg total) by mouth every evening.  Dispense: 90 tablet; Refill: 1  4. Atrial fibrillation, unspecified type (HCC)  - diltiazem (CARDIZEM CD) 300 MG 24 hr capsule; Take 1 capsule (300 mg total) by mouth daily.  Dispense: 90 capsule; Refill: 1  5. Hypertensive heart disease with heart failure (HCC)  - diltiazem (CARDIZEM CD) 300 MG 24 hr capsule; Take 1 capsule (300 mg total) by mouth daily.  Dispense: 90 capsule; Refill: 1  6. Controlled gout  - allopurinol (ZYLOPRIM) 100 MG tablet; Take 1 tablet (100 mg total) by mouth 2 (two) times  daily.  Dispense: 180 tablet; Refill: 1  7. Stage 3a chronic kidney disease ([place])

## 2021-03-09 ENCOUNTER — Ambulatory Visit (INDEPENDENT_AMBULATORY_CARE_PROVIDER_SITE_OTHER): Payer: Medicare PPO | Admitting: Family Medicine

## 2021-03-09 ENCOUNTER — Other Ambulatory Visit: Payer: Self-pay

## 2021-03-09 ENCOUNTER — Encounter: Payer: Self-pay | Admitting: Family Medicine

## 2021-03-09 VITALS — BP 142/68 | HR 73 | Temp 97.8°F | Resp 16 | Ht 70.0 in | Wt 365.0 lb

## 2021-03-09 DIAGNOSIS — E785 Hyperlipidemia, unspecified: Secondary | ICD-10-CM

## 2021-03-09 DIAGNOSIS — E1169 Type 2 diabetes mellitus with other specified complication: Secondary | ICD-10-CM | POA: Diagnosis not present

## 2021-03-09 DIAGNOSIS — I10 Essential (primary) hypertension: Secondary | ICD-10-CM

## 2021-03-09 DIAGNOSIS — I272 Pulmonary hypertension, unspecified: Secondary | ICD-10-CM

## 2021-03-09 DIAGNOSIS — I110 Hypertensive heart disease with heart failure: Secondary | ICD-10-CM | POA: Diagnosis not present

## 2021-03-09 DIAGNOSIS — N2581 Secondary hyperparathyroidism of renal origin: Secondary | ICD-10-CM

## 2021-03-09 DIAGNOSIS — G4733 Obstructive sleep apnea (adult) (pediatric): Secondary | ICD-10-CM | POA: Diagnosis not present

## 2021-03-09 DIAGNOSIS — M109 Gout, unspecified: Secondary | ICD-10-CM

## 2021-03-09 DIAGNOSIS — I700 Atherosclerosis of aorta: Secondary | ICD-10-CM

## 2021-03-09 DIAGNOSIS — Z794 Long term (current) use of insulin: Secondary | ICD-10-CM | POA: Diagnosis not present

## 2021-03-09 DIAGNOSIS — N1831 Chronic kidney disease, stage 3a: Secondary | ICD-10-CM

## 2021-03-09 DIAGNOSIS — J4541 Moderate persistent asthma with (acute) exacerbation: Secondary | ICD-10-CM

## 2021-03-09 DIAGNOSIS — N183 Chronic kidney disease, stage 3 unspecified: Secondary | ICD-10-CM | POA: Diagnosis not present

## 2021-03-09 DIAGNOSIS — E1122 Type 2 diabetes mellitus with diabetic chronic kidney disease: Secondary | ICD-10-CM

## 2021-03-09 DIAGNOSIS — I4891 Unspecified atrial fibrillation: Secondary | ICD-10-CM

## 2021-03-09 LAB — POCT GLYCOSYLATED HEMOGLOBIN (HGB A1C): Hemoglobin A1C: 7 % — AB (ref 4.0–5.6)

## 2021-03-09 MED ORDER — PRAVASTATIN SODIUM 80 MG PO TABS: 80.0000 mg | ORAL_TABLET | Freq: Every evening | ORAL | 1 refills | Status: DC

## 2021-03-09 MED ORDER — ALLOPURINOL 100 MG PO TABS
100.0000 mg | ORAL_TABLET | Freq: Two times a day (BID) | ORAL | 1 refills | Status: DC
Start: 2021-03-09 — End: 2021-07-14

## 2021-03-09 MED ORDER — IRBESARTAN 300 MG PO TABS: 300.0000 mg | ORAL_TABLET | Freq: Every day | ORAL | 1 refills | Status: DC

## 2021-03-09 MED ORDER — POTASSIUM CHLORIDE CRYS ER 20 MEQ PO TBCR: 20.0000 meq | EXTENDED_RELEASE_TABLET | Freq: Every day | ORAL | 1 refills | Status: DC

## 2021-03-09 MED ORDER — DILTIAZEM HCL ER COATED BEADS 300 MG PO CP24: 300.0000 mg | ORAL_CAPSULE | Freq: Every day | ORAL | 1 refills | Status: DC

## 2021-03-16 DIAGNOSIS — G4733 Obstructive sleep apnea (adult) (pediatric): Secondary | ICD-10-CM | POA: Diagnosis not present

## 2021-03-17 ENCOUNTER — Ambulatory Visit: Payer: Medicare PPO | Admitting: Family Medicine

## 2021-04-14 ENCOUNTER — Telehealth: Payer: Self-pay

## 2021-04-14 NOTE — Progress Notes (Signed)
    Chronic Care Management Pharmacy Assistant   Name: Amber Reyes  MRN: [id number] DOB: 21-Apr-1946  Patient called to be reminded of her appointment with [name], CPP on 04/15/2021 @ 0815 via telephone.  Patient aware of appointment date, time, and type of appointment (either telephone or in person). Patient aware to have/bring all medications, supplements, blood pressure and/or blood sugar logs to visit.  Questions: Are you having any problems obtaining your medications? The patient reports that she is having a big issue with [facility] right now since they have changed over to a different company. I informed her that I would do a cost analysis for her regarding the difference between [facility] and [facility] just in case she would like to switch. She stated she is also having a hard time with her Eliquis as the old [facility] would work with her on payments but this pharmacy is not so friendly with that option. I spoke to the patient about applying for PAP but she is unsure if she has actually spent 3% of her income in prescriptions since she actually has patient assistance for her insulin. She does report that she finally has gotten all her medications and she doesn't need anything filled at this time.    Cost analysis completed and emailed to [name], CPP for review and to go over with patient tomorrow at her appointment.   If patient has any PAP medications ask if they are having any problems getting their PAP medication or refill? No  Star Rating Drug: Irbesartan 300 mg last filled on 06/12/2020 for a 90-Day supply with [facility] Pravastatin 80 mg last filled on 06/12/2020 for a 90-Day supply with [facility] 1.5 mg last filled on 08/30/2018 for a 84-Day supply with [facility]  Any gaps in medications fill history? No  Care Gaps: Zoster Vaccines-Shingrix [name] Influenza Vaccine (last completed 05/12/2020)  [name], CPA/CMA Clinical Pharmacist Assistant Phone: [phone]

## 2021-04-15 ENCOUNTER — Other Ambulatory Visit: Payer: Self-pay | Admitting: Family Medicine

## 2021-04-15 ENCOUNTER — Ambulatory Visit (INDEPENDENT_AMBULATORY_CARE_PROVIDER_SITE_OTHER): Payer: Medicare PPO

## 2021-04-15 DIAGNOSIS — E1122 Type 2 diabetes mellitus with diabetic chronic kidney disease: Secondary | ICD-10-CM | POA: Diagnosis not present

## 2021-04-15 DIAGNOSIS — I152 Hypertension secondary to endocrine disorders: Secondary | ICD-10-CM | POA: Diagnosis not present

## 2021-04-15 DIAGNOSIS — Z794 Long term (current) use of insulin: Secondary | ICD-10-CM

## 2021-04-15 DIAGNOSIS — R809 Proteinuria, unspecified: Secondary | ICD-10-CM

## 2021-04-15 DIAGNOSIS — E1159 Type 2 diabetes mellitus with other circulatory complications: Secondary | ICD-10-CM | POA: Diagnosis not present

## 2021-04-15 DIAGNOSIS — N183 Chronic kidney disease, stage 3 unspecified: Secondary | ICD-10-CM | POA: Diagnosis not present

## 2021-04-15 DIAGNOSIS — E1129 Type 2 diabetes mellitus with other diabetic kidney complication: Secondary | ICD-10-CM

## 2021-04-15 NOTE — Progress Notes (Signed)
Chronic Care Management Pharmacy Note  04/16/2021 Name:  Amber Reyes MRN:  [id number] DOB:  Aug 16, 1946  Summary: Patient presents for CCM follow-up. Blood pressure have been mildly elevated, but home pulse readings have been borderline bradycardic. Patient denies new symptoms of therapy.   Recommendations/Changes made from today's visit: -Recommend Farxiga 5 mg daily at next follow-up for CKD / heart failure protection -Consider decreasing diltiazem if bradycardia persists.   Plan: CPP follow-up in 2 months  Subjective: Amber Reyes is an 75 y.o. year old female who is a primary patient of Steele Sizer, MD.  The CCM team was consulted for assistance with disease management and care coordination needs.    Engaged with patient by telephone for follow up visit in response to provider referral for pharmacy case management and/or care coordination services.   Consent to Services:  The patient was given information about Chronic Care Management services, agreed to services, and gave verbal consent prior to initiation of services.  Please see initial visit note for detailed documentation.   Patient Care Team: Steele Sizer, MD as PCP - General (Family Medicine) Wellington Hampshire, MD as PCP - Cardiology (Cardiology) Wellington Hampshire, MD as Consulting Physician (Cardiology) Mottl, Andrez Grime, MD as Referring Physician (Nephrology) Paulla Dolly Tamala Fothergill, DPM as Consulting Physician (Podiatry) Germaine Pomfret, [facility] as Pharmacist (Pharmacist)  Recent office visits: 11/19/20: Patient presented to Dr. Ancil Boozer for follow-up. A1c 6.8%. BP 144/70. Humalog 75/25 10 units twice daily  06/19/20: Patient presented to Clemetine Marker, LPN for AWV.   Recent consult visits: 11/20/20: Patient presented to Dr. Fletcher Anon (Cardiology) for follow-up. BP 150/58. No medication changes made.  11/12/20: Patient presented to Sherlynn Stalls, ANP (Nephrology) for follow-up. BP 175/73.   Hospital visits: None in  previous 6 months  Objective:  Lab Results  Component Value Date   CREATININE 1.29 (H) 05/12/2020   BUN 28 (H) 05/12/2020   GFRNONAA 41 (L) 05/12/2020   GFRAA 48 (L) 05/12/2020   NA 138 05/12/2020   K 4.2 05/12/2020   CALCIUM 9.0 05/12/2020   CO2 29 05/12/2020    Lab Results  Component Value Date/Time   HGBA1C 7.0 (A) 03/09/2021 08:03 AM   HGBA1C 6.8 (A) 11/19/2020 08:02 AM   HGBA1C 6.8 (H) 05/14/2019 12:00 AM   HGBA1C 6.5 01/01/2019 07:48 AM   HGBA1C 6.6 08/30/2018 07:46 AM   HGBA1C 6.6 (A) 08/30/2018 07:46 AM   HGBA1C 6.6 08/30/2018 07:46 AM   MICROALBUR 5.4 05/14/2019 12:00 AM   MICROALBUR 5.7 04/25/2018 09:29 AM   MICROALBUR 20 08/15/2015 08:12 AM    Last diabetic Eye exam:  Lab Results  Component Value Date/Time   HMDIABEYEEXA No Retinopathy 08/18/2020 12:00 AM    Last diabetic Foot exam: No results found for: HMDIABFOOTEX   Lab Results  Component Value Date   CHOL 115 05/12/2020   HDL 47 (L) 05/12/2020   LDLCALC 52 05/12/2020   TRIG 82 05/12/2020   CHOLHDL 2.4 05/12/2020    Hepatic Function Latest Ref Rng & Units 05/12/2020 05/14/2019 11/25/2017  Total Protein 6.1 - 8.1 g/dL 6.8 6.6 6.4  Albumin 3.6 - 5.1 g/dL - - -  AST 10 - 35 U/L '15 11 13  ' ALT 6 - 29 U/L 8 5(L) 7  Alk Phosphatase 33 - 130 U/L - - -  Total Bilirubin 0.2 - 1.2 mg/dL 0.5 0.5 0.3  Bilirubin, Direct 0.0 - 0.2 mg/dL - - 0.0    No results found for: TSH,  FREET4  CBC Latest Ref Rng & Units 11/19/2020 05/12/2020 05/14/2019  WBC 3.8 - 10.8 Thousand/uL 7.6 8.6 9.3  Hemoglobin 11.7 - 15.5 g/dL 10.2(L) 10.3(L) 10.2(L)  Hematocrit 35.0 - 45.0 % 31.9(L) 31.7(L) 31.9(L)  Platelets 140 - 400 Thousand/uL 225 229 227    Lab Results  Component Value Date/Time   VD25OH 45 05/12/2020 08:59 AM   VD25OH 48 05/14/2019 12:00 AM    Clinical ASCVD: Yes  The ASCVD Risk score Mikey Bussing DC Jr., et al., 2013) failed to calculate for the following reasons:   The valid total cholesterol range is 130 to 320 mg/dL     Depression screen [place] 2/9 03/09/2021 11/19/2020 06/19/2020  Decreased Interest 0 0 0  Down, Depressed, Hopeless 0 0 0  PHQ - 2 Score 0 0 0  Altered sleeping - - -  Tired, decreased energy - - -  Change in appetite - - -  Feeling bad or failure about yourself  - - -  Trouble concentrating - - -  Moving slowly or fidgety/restless - - -  Suicidal thoughts - - -  PHQ-9 Score - - -  Difficult doing work/chores - - -  Some recent data might be hidden    Social History   Tobacco Use  Smoking Status Never  Smokeless Tobacco Never  Tobacco Comments   n/a   BP Readings from Last 3 Encounters:  03/09/21 (!) 142/68  11/20/20 (!) 150/58  11/19/20 (!) 144/70   Pulse Readings from Last 3 Encounters:  03/09/21 73  11/20/20 71  11/19/20 86   Wt Readings from Last 3 Encounters:  03/09/21 (!) 365 lb (165.6 kg)  11/20/20 (!) 375 lb (170.1 kg)  11/19/20 (!) 374 lb (169.6 kg)    Assessment/Interventions: Review of patient past medical history, allergies, medications, health status, including review of consultants reports, laboratory and other test data, was performed as part of comprehensive evaluation and provision of chronic care management services.   SDOH:  (Social Determinants of Health) assessments and interventions performed: Yes     CCM Care Plan  No Known Allergies  Medications Reviewed Today     Reviewed by Carlene Coria, [place] (Certified Medical Assistant) on 03/09/21 at 0800  Med List Status: <None>   Medication Order Taking? Sig Documenting Provider Last Dose Status Informant  albuterol (PROVENTIL HFA;VENTOLIN HFA) 108 (90 BASE) MCG/ACT inhaler [id number] Yes Inhale 2 puffs into the lungs every 6 (six) hours as needed for wheezing or shortness of breath. [provider] Taking Active Self  allopurinol (ZYLOPRIM) 100 MG tablet [id number] Yes TAKE 1 TABLET TWICE DAILY Sowles, Drue Stager, MD Taking Active   Blood Glucose Calibration (TRUE METRIX LEVEL 1) Low SOLN  [id number] Yes 1 each by In Vitro route once a week. Steele Sizer, MD Taking Active   Blood Glucose Monitoring Suppl (TRUE METRIX AIR GLUCOSE METER) w/Device KIT [id number] Yes Inject 1 each into the skin 4 (four) times daily as needed. Check fsbs 4 times daily E11.22 Steele Sizer, MD Taking Active   Cholecalciferol (VITAMIN D) 2000 UNITS tablet [id number] Yes Take 2,000 Units by mouth daily. [provider] Taking Active Self  colchicine (COLCRYS) 0.6 MG tablet [id number] Yes Take 1-2 tablets (0.6-1.2 mg total) by mouth daily as needed. Steele Sizer, MD Taking Active   diltiazem (CARDIZEM CD) 300 MG 24 hr capsule [id number] Yes TAKE 1 CAPSULE EVERY DAY Steele Sizer, MD Taking Active   DROPLET PEN NEEDLES 30G X 8 MM MISC [id number] Yes USE TWICE DAILY  WITH INSULIN Steele Sizer, MD Taking Active   Dulaglutide (TRULICITY) 1.5 JQ/7.3AL SOPN [id number] Yes Inject 1.5 mg into the skin once a week. Steele Sizer, MD Taking Active   Elastic Bandages & Supports ([place]) [place] [id number] Yes 2 each by Does not apply route daily. Steele Sizer, MD Taking Active Self  ELIQUIS 5 MG TABS tablet [id number] Yes TAKE 1 TABLET TWICE DAILY Wellington Hampshire, MD Taking Active   fluticasone (FLONASE) 50 MCG/ACT nasal spray [id number] Yes Place 2 sprays into both nostrils daily. Steele Sizer, MD Taking Active   Fluticasone-Salmeterol (ADVAIR) 250-50 MCG/DOSE AEPB [id number] Yes INHALE 1 DOSE BY MOUTH TWICE DAILY Ancil Boozer, Drue Stager, MD Taking Active   furosemide (LASIX) 40 MG tablet [id number] Yes TAKE 1 TABLET (40 MG TOTAL) BY MOUTH 2 (TWO) TIMES DAILY AS NEEDED. Steele Sizer, MD Taking Active   Insulin Lispro Prot & Lispro (HUMALOG MIX 75/25 KWIKPEN) (75-25) 100 UNIT/ML Claiborne Rigg [id number] Yes Inject 10 Units into the skin 2 (two) times daily with a meal. Steele Sizer, MD Taking Active   irbesartan (AVAPRO) 300 MG tablet [id number] Yes TAKE 1 TABLET EVERY DAY Steele Sizer, MD  Taking Active   pravastatin (PRAVACHOL) 80 MG tablet [id number] Yes TAKE 1 TABLET (80 MG TOTAL) BY MOUTH EVERY EVENING. Steele Sizer, MD Taking Active   TRUE METRIX BLOOD GLUCOSE TEST test strip [id number] Yes CHECK BLOOD SUGAR FOUR TIMES DAILY Steele Sizer, MD Taking Active             Patient Active Problem List   Diagnosis Date Noted   Hypertension associated with stage 3 chronic kidney disease due to type 2 diabetes mellitus ([place]) 02/06/2020   History of colonic polyps    Polyp of colon    Thoracic aortic atherosclerosis ([place]) 12/30/2016   Controlled type 2 diabetes mellitus with stage 3 chronic kidney disease ([place]) 12/23/2016   Venous insufficiency 05/21/2016   Hypertensive heart disease 05/21/2016   Anemia in chronic kidney disease 12/22/2015   Pulmonary hypertension ([place]) 12/18/2015   Xanthelasma 04/15/2015   Anxiety 04/12/2015   Chronic kidney disease (CKD), stage III (moderate) (HCC) 04/12/2015   Edema extremities 04/12/2015   Disc disorder of lumbar region 04/12/2015   Asthma, moderate persistent 04/12/2015   Morbid obesity, unspecified obesity type ([place]) 04/12/2015   Obstructive apnea 04/12/2015   Restless leg 04/12/2015   Allergic rhinitis 04/12/2015   Vitamin D deficiency 04/12/2015   Controlled gout 04/12/2015   Premature atrial contractions 11/29/2013   Atrial fibrillation ([place]) 11/09/2013   Benign essential hypertension    Hyperlipidemia     Immunization History  Administered Date(s) Administered   Fluad Quad(high Dose 65+) 05/14/2019, 05/12/2020   Influenza Split 05/16/2007   Influenza, High Dose Seasonal PF 06/24/2017, 04/25/2018   Influenza, Seasonal, Injecte, Preservative Fre 05/07/2010, 04/27/2011   Influenza,inj,Quad PF,6+ Mos 08/02/2014, 04/15/2015, 05/22/2016   Influenza-Unspecified 08/02/2014   Moderna Sars-Covid-2 Vaccination 09/28/2019, 10/26/2019, 07/02/2020   Pneumococcal Conjugate-13 08/02/2014   Pneumococcal Polysaccharide-23  04/27/2011, 06/04/2016   Tdap 09/29/2012   Zoster, Live 09/29/2012    Conditions to be addressed/monitored:  Hypertension, Hyperlipidemia, Diabetes, Atrial Fibrillation, Asthma, Chronic Kidney Disease and Gout  Care Plan : General Pharmacy (Adult)  Updates made by Germaine Pomfret, RPH since 04/16/2021 12:00 AM     Problem: Hypertension, Hyperlipidemia, Diabetes, Atrial Fibrillation, Asthma, Chronic Kidney Disease and Gout   Priority: High     Long-Range Goal: Patient-Specific Goal   Start Date: 10/29/2020  Expected End Date: 04/16/2022  This  Visit's Progress: On track  Recent Progress: On track  Priority: High  Note:   Current Barriers:  Unable to independently afford treatment regimen Suboptimal therapeutic regimen for diabetes  Pharmacist Clinical Goal(s):  Over the next 90 days, patient will maintain control of diabetes as evidenced by A1c less than 8%  through collaboration with PharmD and provider.   Interventions: 1:1 collaboration with Steele Sizer, MD regarding development and update of comprehensive plan of care as evidenced by provider attestation and co-signature Inter-disciplinary care team collaboration (see longitudinal plan of care) Comprehensive medication review performed; medication list updated in electronic medical record  Hypertension (BP goal <140/90) -Controlled -Current treatment: Diltiazem CD 300 mg daily (AM)  Furosemide 40 mg twice daily as needed (uses every other day)  Irbersartan 300 mg daily (PM) -Medications previously tried: NA  -Current home readings (Prior to medication):    150/63, Pulse 64   142/64, Pulse 66   130/63, Pulse 58   147/73, Pulse 72   132/57, Pulse 67   138/61, Pulse 56  127/65, Pulse 61   135/61, Pulse 56   -Current dietary habits: She has been eating less, trying to follow a low-carb, low-sodium diet.   -Current exercise habits: Unable to walk long distances.  -Denies hypotensive/hypertensive  symptoms -Recommended to continue current medication  Hyperlipidemia: (LDL goal < 70) -Controlled -Current treatment: Pravastatin 80 mg daily  -Medications previously tried: NA  -Educated on Importance of limiting foods high in cholesterol; -Recommended to continue current medication  Diabetes (A1c goal <8%) -Controlled -Current medications: Trulicity 1.5 mg weekly  Humalog 75-25 10 units before breakfast, 10 units before supper  -Medications previously tried: Metformin  -Current home glucose readings:   Fasting Pre-Dinner After dinner  24-Aug 128    23-Aug 131  148  22-Aug 132 98   21-Aug 126  169  20-Aug 134 137   19-Aug 134  197  18-Aug 125 115   17-Aug 131  213  Average 130 117 182  -Denies  hypoglycemic symptoms -Continue current medications  Atrial Fibrillation (Goal: prevent stroke and major bleeding) -Controlled -CHADSVASC: 5 -Current treatment: Rate control: Diltiazem CD 300 mg daily  Anticoagulation: Eliquis 5 mg twice daily  -Medications previously tried: NA -Home BP and HR readings: NA  -Counseled on avoidance of NSAIDs due to increased bleeding risk with anticoagulants; -Recommended to continue current medication  Asthma (Goal: control symptoms and prevent exacerbations) -Controlled -Current treatment  Proventil HFA 2 puffs every 6 hours as needed Advair 1 puff twice daily  -Medications previously tried: NA  -Exacerbations requiring treatment in last 6 months: None -Patient reports consistent use of maintenance inhaler -Frequency of rescue inhaler use: NA -Counseled on When to use rescue inhaler -Recommended to continue current medication  Patient Goals/Self-Care Activities Over the next 90 days, patient will:  - check glucose 3 times daily, before breakfast, supper, and bedtime, document, and provide at future appointments check blood pressure 2-3 times weekly, document, and provide at future appointments  Follow Up Plan: Telephone follow up  appointment with care management team member scheduled for:  06/10/2021 at 8:30 AM     Medication Assistance:  Trulicity, Humalog obtained through [employer] medication assistance program.  Enrollment ends Dec 2022 Eliquis patient assistance is still pending. Patient denied until able to show out of pocket spending report.   Patient's preferred pharmacy is:  [facility] - [place] [place]  [place] [address] Phone: [phone] Fax: [phone]  [facility] [address], [place] - [place] [address] Phone: [phone] Fax: [phone]  [facility] [place], [place] - [address] [place] [address] Phone: [phone] Fax: [phone]  Uses pill box? Yes Pt endorses 100% compliance  We discussed: Current pharmacy is preferred with insurance plan and patient is satisfied with pharmacy services Patient decided to: Continue current medication management strategy  Care Plan and Follow Up Patient Decision:  Patient agrees to Care Plan and Follow-up.  Plan: Telephone follow up appointment with care management team member scheduled for:  06/10/2021 at 8:30 AM   [facility] [phone]

## 2021-04-21 ENCOUNTER — Telehealth: Payer: Self-pay | Admitting: Cardiovascular Disease

## 2021-04-21 ENCOUNTER — Other Ambulatory Visit: Payer: Self-pay

## 2021-04-21 DIAGNOSIS — I110 Hypertensive heart disease with heart failure: Secondary | ICD-10-CM

## 2021-04-21 DIAGNOSIS — I10 Essential (primary) hypertension: Secondary | ICD-10-CM

## 2021-04-21 DIAGNOSIS — I4891 Unspecified atrial fibrillation: Secondary | ICD-10-CM

## 2021-04-21 MED ORDER — TRUE METRIX BLOOD GLUCOSE TEST VI STRP: ORAL_STRIP | 0 refills | Status: DC

## 2021-04-21 NOTE — Telephone Encounter (Signed)
STAT if HR is under 50 or over 120 (normal HR is 60-100 beats per minute)  What is your heart rate? This morning 49   Do you have a log of your heart rate readings (document readings)?  Yesterday morning 59        8/28 was 52      This Week the highest was 62  Do you have any other symptoms? Just feeling a little tired.

## 2021-04-21 NOTE — Telephone Encounter (Signed)
Spoke with the patient. Patient called to report her HRs. Patient sts that she normally checks her BP/HR/ BS each morning. She has noted that her HRs have been running lower than her usually.  This morning 49 bpm Monday 8/29 morning 59 Sunday 8/28 was 52       This Week the highest was 62. Readings provided were at rest. She reports HR in the 70's when checking after moving around.  Patient reports feeling a little more fatigued. She denies pre-syncope, syncope, lightheadedness.  Advised the patient the readings were not concerning since she is on Diltiazem for rate control. She is concerned about the readings and would like Dr. Fletcher Anon updated. Adv the patient that I will fwd the update to Dr. Fletcher Anon to advise.

## 2021-04-22 DIAGNOSIS — M13812 Other specified arthritis, left shoulder: Secondary | ICD-10-CM | POA: Diagnosis not present

## 2021-04-22 DIAGNOSIS — M19019 Primary osteoarthritis, unspecified shoulder: Secondary | ICD-10-CM | POA: Insufficient documentation

## 2021-04-22 DIAGNOSIS — M25512 Pain in left shoulder: Secondary | ICD-10-CM | POA: Diagnosis not present

## 2021-04-22 DIAGNOSIS — M778 Other enthesopathies, not elsewhere classified: Secondary | ICD-10-CM | POA: Insufficient documentation

## 2021-04-22 DIAGNOSIS — M7592 Shoulder lesion, unspecified, left shoulder: Secondary | ICD-10-CM | POA: Diagnosis not present

## 2021-04-23 MED ORDER — DILTIAZEM HCL ER COATED BEADS 240 MG PO CP24: 240.0000 mg | ORAL_CAPSULE | Freq: Every day | ORAL | 1 refills | Status: DC

## 2021-04-23 NOTE — Telephone Encounter (Signed)
Patient made aware of Dr. Tyrell Antonio response and recommendation. Rx for Diltiazem 240 mg sent to the patients pharmacy. Adv the patient to continue to monitor her BP and HR and to call the office if her HR remains low. Patient verbalized understanding and voiced appreciation for the assistance.

## 2021-04-23 NOTE — Telephone Encounter (Signed)
We could decrease diltiazem extended release to 240 mg once daily.

## 2021-04-24 ENCOUNTER — Other Ambulatory Visit: Payer: Self-pay

## 2021-04-24 DIAGNOSIS — N183 Chronic kidney disease, stage 3 unspecified: Secondary | ICD-10-CM

## 2021-04-24 DIAGNOSIS — E1122 Type 2 diabetes mellitus with diabetic chronic kidney disease: Secondary | ICD-10-CM

## 2021-04-24 MED ORDER — TRUEPLUS LANCETS 33G MISC: 1.0000 | Freq: Three times a day (TID) | 3 refills | Status: DC | PRN

## 2021-04-29 ENCOUNTER — Telehealth: Payer: Self-pay | Admitting: Cardiovascular Disease

## 2021-04-29 DIAGNOSIS — I10 Essential (primary) hypertension: Secondary | ICD-10-CM

## 2021-04-29 DIAGNOSIS — I4891 Unspecified atrial fibrillation: Secondary | ICD-10-CM

## 2021-04-29 DIAGNOSIS — I110 Hypertensive heart disease with heart failure: Secondary | ICD-10-CM

## 2021-04-29 MED ORDER — DILTIAZEM HCL ER COATED BEADS 240 MG PO CP24
240.0000 mg | ORAL_CAPSULE | Freq: Every day | ORAL | 1 refills | Status: DC
Start: 2021-04-29 — End: 2021-10-22

## 2021-04-29 NOTE — Telephone Encounter (Signed)
Requested Prescriptions   Signed Prescriptions Disp Refills   diltiazem (CARDIZEM CD) 240 MG 24 hr capsule 90 capsule 1    Sig: Take 1 capsule (240 mg total) by mouth daily.    Authorizing Provider: Kathlyn Sacramento A    Ordering User: Raelene Bott, Verina Galeno L

## 2021-04-29 NOTE — Telephone Encounter (Signed)
*  STAT* If patient is at the pharmacy, call can be transferred to refill team.   1. Which medications need to be refilled? (please list name of each medication and dose if known)   DILTIAZEM 240 MG PO Q 24 HR   2. Which pharmacy/location (including street and city if local pharmacy) is medication to be sent to? [facility]   3. Do they need a 30 day or 90 day supply? [place]

## 2021-05-27 ENCOUNTER — Other Ambulatory Visit: Payer: Self-pay | Admitting: Family Medicine

## 2021-05-27 DIAGNOSIS — J4540 Moderate persistent asthma, uncomplicated: Secondary | ICD-10-CM

## 2021-06-02 ENCOUNTER — Other Ambulatory Visit: Payer: Self-pay

## 2021-06-02 DIAGNOSIS — E1169 Type 2 diabetes mellitus with other specified complication: Secondary | ICD-10-CM

## 2021-06-02 MED ORDER — PRAVASTATIN SODIUM 80 MG PO TABS: 80.0000 mg | ORAL_TABLET | Freq: Every evening | ORAL | 0 refills | Status: DC

## 2021-06-09 ENCOUNTER — Telehealth: Payer: Self-pay

## 2021-06-09 NOTE — Progress Notes (Signed)
    Chronic Care Management Pharmacy Assistant   Name: Amber Reyes  MRN: [id number] DOB: March 02, 1946  Renewal patient Assistance Application for Trulicity/Humalog/ Confirm scheduled appointment with CPP  Patient is currently on patient assistance for her Trulicity and her Humalog with [employer]. The foundation requires the renewal applications be faxed in prior to 08/22/2021.   Applications for both medications have been started for the patient. Once the patient receives the application she will need to complete it, and return it to the providers office so the CPP can fax it to [employer] at [phone].   Called patient to speak with her about the renewal process, and to see how she would like to receive the applications via mail or pick them up at the PCP's Office. I had to leave a message for the patient. The patient has an appointment with CPP tomorrow so I informed her she could let Alex know what's the best way to ensure she receives the application.   E-mail sent to CPP with the applications for him to print so he can either mail, or leave them at the front desk for the patient to pick up depending on her choice once she speaks to him.    Patient called to reminded of her telephone appointment with [name], CPP on 06/10/2021 @ 0830 via telephone.  No answer, left message of appointment date, time and type of appointment (either telephone or in person). Left message to have all medications, supplements, blood pressure and/or blood sugar logs available during appointment and to return call if need to reschedule.  Star Rating Drug: Pravastatin 80 mg last filled on 06/12/2020 for a 90-Day supply with [facility] Irbesartan 300 mg last filled on 06/12/2020 for a 90-Day supply with [place] 1.5 mg Patient is on PAP with [employer] for this medication  Spoke with [employer] and the representative stated the last  fill for patient's Pravastatin 80 mg was on 04/01/2021, and is in the process of being filled again. The representative advised as far as the Irbesartan the date of fill is correct. They do have a prescription on file for this medication patient just hasn't filled it since last year.   Any gaps in medications fill history? Yes  Care Gaps: Zoster Vaccines COVID-19 Booster 4 Influenza Vaccine Blood Pressure > 140/90 (Last PCP visit it was 142/68 on 03/09/2021)  [name], CPA/CMA Clinical Pharmacist Assistant Phone: [phone]

## 2021-06-10 ENCOUNTER — Ambulatory Visit (INDEPENDENT_AMBULATORY_CARE_PROVIDER_SITE_OTHER): Payer: Medicare PPO

## 2021-06-10 DIAGNOSIS — I129 Hypertensive chronic kidney disease with stage 1 through stage 4 chronic kidney disease, or unspecified chronic kidney disease: Secondary | ICD-10-CM

## 2021-06-10 DIAGNOSIS — N183 Chronic kidney disease, stage 3 unspecified: Secondary | ICD-10-CM

## 2021-06-10 DIAGNOSIS — N1831 Chronic kidney disease, stage 3a: Secondary | ICD-10-CM

## 2021-06-10 DIAGNOSIS — Z794 Long term (current) use of insulin: Secondary | ICD-10-CM

## 2021-06-10 NOTE — Progress Notes (Signed)
Chronic Care Management Pharmacy Note  06/25/2021 Name:  Amber Reyes MRN:  [id number] DOB:  [birth year]  Summary: Patient presents for CCM follow-up. Dr. Fletcher Anon decreased patient's diltiazem, which greatly improved her fatigue and bradycardia. Blood pressure and blood sugar remains well controlled.   Recommendations/Changes made from today's visit: -Recommend Farxiga 10 mg daily at next follow-up for CKD / heart failure protection -Recommend decreasing Insulin to 5 units twice daily    Plan: CPP follow-up in 3 months  Subjective: Amber Reyes is an 75 y.o. year old female who is a primary patient of Steele Sizer, MD.  The CCM team was consulted for assistance with disease management and care coordination needs.    Engaged with patient by telephone for follow up visit in response to provider referral for pharmacy case management and/or care coordination services.   Consent to Services:  The patient was given information about Chronic Care Management services, agreed to services, and gave verbal consent prior to initiation of services.  Please see initial visit note for detailed documentation.   Patient Care Team: Steele Sizer, MD as PCP - General (Family Medicine) Wellington Hampshire, MD as Consulting Physician (Cardiology) Germaine Pomfret, [facility] as Pharmacist (Pharmacist)  Recent office visits: 03/09/21: Patient presented to Dr. Ancil Boozer for follow-up. A1c 7.0%. 11/19/20: Patient presented to Dr. Ancil Boozer for follow-up. A1c 6.8%. BP 144/70. Humalog 75/25 10 units twice daily  06/19/20: Patient presented to Clemetine Marker, LPN for AWV.   Recent consult visits: 04/21/21: Dr. Fletcher Anon decreased Diltiazem to 240 mg daily due to bradycardia.  11/20/20: Patient presented to Dr. Fletcher Anon (Cardiology) for follow-up. BP 150/58. No medication changes made.  11/12/20: Patient presented to Sherlynn Stalls, ANP (Nephrology) for follow-up. BP 175/73.   Hospital visits: None in previous 6  months  Objective:  Lab Results  Component Value Date   CREATININE 1.29 (H) 05/12/2020   BUN 28 (H) 05/12/2020   GFRNONAA 41 (L) 05/12/2020   GFRAA 48 (L) 05/12/2020   NA 138 05/12/2020   K 4.2 05/12/2020   CALCIUM 9.0 05/12/2020   CO2 29 05/12/2020    Lab Results  Component Value Date/Time   HGBA1C 7.0 (A) 03/09/2021 08:03 AM   HGBA1C 6.8 (A) 11/19/2020 08:02 AM   HGBA1C 6.8 (H) 05/14/2019 12:00 AM   HGBA1C 6.5 01/01/2019 07:48 AM   HGBA1C 6.6 08/30/2018 07:46 AM   HGBA1C 6.6 (A) 08/30/2018 07:46 AM   HGBA1C 6.6 08/30/2018 07:46 AM   MICROALBUR 5.4 05/14/2019 12:00 AM   MICROALBUR 5.7 04/25/2018 09:29 AM   MICROALBUR 20 08/15/2015 08:12 AM    Last diabetic Eye exam:  Lab Results  Component Value Date/Time   HMDIABEYEEXA No Retinopathy 08/18/2020 12:00 AM    Last diabetic Foot exam: No results found for: HMDIABFOOTEX   Lab Results  Component Value Date   CHOL 115 05/12/2020   HDL 47 (L) 05/12/2020   LDLCALC 52 05/12/2020   TRIG 82 05/12/2020   CHOLHDL 2.4 05/12/2020    Hepatic Function Latest Ref Rng & Units 05/12/2020 05/14/2019 11/25/2017  Total Protein 6.1 - 8.1 g/dL 6.8 6.6 6.4  Albumin 3.6 - 5.1 g/dL - - -  AST 10 - 35 U/L _0 ALT 6 - 29 U/L 8 5(L) 7  Alk Phosphatase 33 - 130 U/L - - -  Total Bilirubin 0.2 - 1.2 mg/dL 0.5 0.5 0.3  Bilirubin, Direct 0.0 - 0.2 mg/dL - - 0.0    No results found for:  TSH, FREET4  CBC Latest Ref Rng & Units 11/19/2020 05/12/2020 05/14/2019  WBC 3.8 - 10.8 Thousand/uL 7.6 8.6 9.3  Hemoglobin 11.7 - 15.5 g/dL 10.2(L) 10.3(L) 10.2(L)  Hematocrit 35.0 - 45.0 % 31.9(L) 31.7(L) 31.9(L)  Platelets 140 - 400 Thousand/uL 225 229 227    Lab Results  Component Value Date/Time   VD25OH 45 05/12/2020 08:59 AM   VD25OH 48 05/14/2019 12:00 AM    Clinical ASCVD: Yes  The ASCVD Risk score (Arnett DK, et al., 2019) failed to calculate for the following reasons:   The valid total cholesterol range is 130 to 320 mg/dL     Depression screen [facility] 2/9 06/23/2021 03/09/2021 11/19/2020  Decreased Interest 0 0 0  Down, Depressed, Hopeless 0 0 0  PHQ - 2 Score 0 0 0  Altered sleeping - - -  Tired, decreased energy - - -  Change in appetite - - -  Feeling bad or failure about yourself  - - -  Trouble concentrating - - -  Moving slowly or fidgety/restless - - -  Suicidal thoughts - - -  PHQ-9 Score - - -  Difficult doing work/chores - - -  Some recent data might be hidden    Social History   Tobacco Use  Smoking Status Never  Smokeless Tobacco Never  Tobacco Comments   n/a   BP Readings from Last 3 Encounters:  03/09/21 (!) 142/68  11/20/20 (!) 150/58  11/19/20 (!) 144/70   Pulse Readings from Last 3 Encounters:  03/09/21 73  11/20/20 71  11/19/20 86   Wt Readings from Last 3 Encounters:  03/09/21 (!) 365 lb (165.6 kg)  11/20/20 (!) 375 lb (170.1 kg)  11/19/20 (!) 374 lb (169.6 kg)    Assessment/Interventions: Review of patient past medical history, allergies, medications, health status, including review of consultants reports, laboratory and other test data, was performed as part of comprehensive evaluation and provision of chronic care management services.   SDOH:  (Social Determinants of Health) assessments and interventions performed: Yes     CCM Care Plan  No Known Allergies  Medications Reviewed Today     Reviewed by Clemetine Marker, LPN (Licensed Practical Nurse) on 06/23/21 at [phone]  Med List Status: <None>   Medication Order Taking? Sig Documenting Provider Last Dose Status Informant  acetaminophen (TYLENOL) 650 MG CR tablet [id number] Yes Take 650 mg by mouth in the morning and at bedtime. [provider] Taking Active   albuterol (PROVENTIL HFA;VENTOLIN HFA) 108 (90 BASE) MCG/ACT inhaler [id number] Yes Inhale 2 puffs into the lungs every 6 (six) hours as needed for wheezing or shortness of breath. [provider] Taking Active Self  allopurinol (ZYLOPRIM) 100 MG  tablet [id number] Yes Take 1 tablet (100 mg total) by mouth 2 (two) times daily. Steele Sizer, MD Taking Active   Blood Glucose Calibration (TRUE METRIX LEVEL 1) Low SOLN [id number] Yes 1 each by In Vitro route once a week. Steele Sizer, MD Taking Active   Blood Glucose Monitoring Suppl (TRUE METRIX AIR GLUCOSE METER) w/Device KIT [id number] Yes Inject 1 each into the skin 4 (four) times daily as needed. Check fsbs 4 times daily E11.22 Steele Sizer, MD Taking Active   Cholecalciferol (VITAMIN D) 2000 UNITS tablet [id number] Yes Take 2,000 Units by mouth daily. [provider] Taking Active Self  colchicine (COLCRYS) 0.6 MG tablet [id number] Yes Take 1-2 tablets (0.6-1.2 mg total) by mouth daily as needed. Steele Sizer, MD Taking Active   diltiazem (CARDIZEM  CD) 240 MG 24 hr capsule [id number] Yes Take 1 capsule (240 mg total) by mouth daily. Wellington Hampshire, MD Taking Active   DROPLET PEN NEEDLES 30G X 8 MM MISC [id number] Yes USE TWICE DAILY WITH INSULIN Ancil Boozer, Drue Stager, MD Taking Active   Dulaglutide (TRULICITY) 1.5 EZ/6.6QH SOPN [id number] Yes Inject 1.5 mg into the skin once a week. Steele Sizer, MD Taking Active   Elastic Bandages & Supports ([place]) [place] [id number] Yes 2 each by Does not apply route daily. Steele Sizer, MD Taking Active Self  ELIQUIS 5 MG TABS tablet [id number] Yes TAKE 1 TABLET TWICE DAILY Wellington Hampshire, MD Taking Active   fluticasone (FLONASE) 50 MCG/ACT nasal spray [id number] Yes Place 2 sprays into both nostrils daily. Steele Sizer, MD Taking Active   fluticasone-salmeterol (ADVAIR) 250-50 MCG/ACT AEPB [id number] Yes INHALE 1 PUFF BY MOUTH TWICE DAILY Ancil Boozer, Drue Stager, MD Taking Active   furosemide (LASIX) 40 MG tablet [id number] Yes TAKE 1 TABLET (40 MG TOTAL) BY MOUTH 2 (TWO) TIMES DAILY AS NEEDED. Steele Sizer, MD Taking Active   glucose blood (TRUE METRIX BLOOD GLUCOSE TEST) test strip [id number] Yes CHECK BLOOD SUGAR FOUR  TIMES DAILY Steele Sizer, MD Taking Active   Insulin Lispro Prot & Lispro (HUMALOG MIX 75/25 KWIKPEN) (75-25) 100 UNIT/ML Claiborne Rigg [id number] Yes Inject 10 Units into the skin 2 (two) times daily with a meal. Steele Sizer, MD Taking Active   irbesartan (AVAPRO) 300 MG tablet [id number] Yes Take 1 tablet (300 mg total) by mouth daily. Steele Sizer, MD Taking Active   potassium chloride SA (KLOR-CON) 20 MEQ tablet [id number] No Take 1 tablet (20 mEq total) by mouth daily. With furosemide  Patient not taking: Reported on 06/23/2021   Steele Sizer, MD Not Taking Active   pravastatin (PRAVACHOL) 80 MG tablet [id number] Yes Take 1 tablet (80 mg total) by mouth every evening. Steele Sizer, MD Taking Active   traMADol Veatrice Bourbon) 50 MG tablet [id number] Yes Take 1 tablet by mouth as needed. [provider] Taking Active   TRUEplus Lancets 33G [place] [id number] Yes 1 each by Does not apply route 3 (three) times daily as needed. Steele Sizer, MD Taking Active             Patient Active Problem List   Diagnosis Date Noted   Localized, 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/22/2021   Pain in joint of left shoulder 04/22/2021   Tendonitis of shoulder, left 04/22/2021   Hypertension associated with stage 3 chronic kidney disease due to type 2 diabetes mellitus ([place]) 02/06/2020   History of colonic polyps    Polyp of colon    Thoracic aortic atherosclerosis ([place]) 12/30/2016   Controlled type 2 diabetes mellitus with stage 3 chronic kidney disease ([place]) 12/23/2016   Venous insufficiency 05/21/2016   Hypertensive heart disease 05/21/2016   Anemia in chronic kidney disease 12/22/2015   Pulmonary hypertension ([place]) 12/18/2015   Xanthelasma 04/15/2015   Anxiety 04/12/2015   Chronic kidney disease (CKD), stage III (moderate) (HCC) 04/12/2015   Edema extremities 04/12/2015   Disc disorder of lumbar region 04/12/2015   Asthma, moderate persistent 04/12/2015   Morbid obesity,  unspecified obesity type ([place]) 04/12/2015   Obstructive apnea 04/12/2015   Restless leg 04/12/2015   Allergic rhinitis 04/12/2015   Vitamin D deficiency 04/12/2015   Controlled gout 04/12/2015   Premature atrial contractions 11/29/2013   Atrial fibrillation ([place]) 11/09/2013   Benign essential hypertension    Hyperlipidemia     Immunization History  Administered Date(s) Administered   Fluad Quad(high Dose 65+) 05/14/2019, 05/12/2020   Influenza Split 05/16/2007   Influenza, High Dose Seasonal PF 06/24/2017, 04/25/2018   Influenza, Seasonal, Injecte, Preservative Fre 05/07/2010, 04/27/2011   Influenza,inj,Quad PF,6+ Mos 08/02/2014, 04/15/2015, 05/22/2016   Influenza-Unspecified 08/02/2014   Moderna Sars-Covid-2 Vaccination 09/28/2019, 10/26/2019, 07/02/2020   Pneumococcal Conjugate-13 08/02/2014   Pneumococcal Polysaccharide-23 04/27/2011, 06/04/2016   Tdap 09/29/2012   Zoster, Live 09/29/2012    Conditions to be addressed/monitored:  Hypertension, Hyperlipidemia, Diabetes, Atrial Fibrillation, Asthma, Chronic Kidney Disease and Gout  Care Plan : General Pharmacy (Adult)  Updates made by Germaine Pomfret, RPH since 06/25/2021 12:00 AM     Problem: Hypertension, Hyperlipidemia, Diabetes, Atrial Fibrillation, Asthma, Chronic Kidney Disease and Gout   Priority: High     Long-Range Goal: Patient-Specific Goal   Start Date: 10/29/2020  Expected End Date: 04/16/2022  This Visit's Progress: On track  Recent Progress: On track  Priority: High  Note:   Current Barriers:  Unable to independently afford treatment regimen Suboptimal therapeutic regimen for diabetes  Pharmacist Clinical Goal(s):  Over the next 90 days, patient will maintain control of diabetes as evidenced by A1c less than 8%  through collaboration with PharmD and provider.   Interventions: 1:1 collaboration with Steele Sizer, MD regarding development and update of comprehensive plan of care as evidenced by  provider attestation and co-signature Inter-disciplinary care team collaboration (see longitudinal plan of care) Comprehensive medication review performed; medication list updated in electronic medical record  Hypertension (BP goal <140/90) -Controlled -Current treatment: Diltiazem CD 240 mg daily (AM)  Furosemide 40 mg twice daily as needed (uses every other day)  Irbersartan 300 mg daily (PM) -Medications previously tried: NA  -Current home readings (Prior to medication):    138/58, Pulse 54   138/56, Pulse 74   156/73, Pulse 72   134/70, Pulse 74   131/61   135/58   -Current dietary habits: She has been eating less, trying to follow a low-carb, low-sodium diet.   -Current exercise habits: Unable to walk long distances.  -Denies hypotensive/hypertensive symptoms -Stop -Recommended to continue current medication  Hyperlipidemia: (LDL goal < 70) -Controlled -Current treatment: Pravastatin 80 mg daily  -Medications previously tried: NA  -Educated on Importance of limiting foods high in cholesterol; -Recommended to continue current medication  Diabetes (A1c goal <8%) -Controlled -Current medications: Trulicity 1.5 mg weekly  Humalog 75-25 10 units before breakfast, 10 units before supper  -Medications previously tried: Metformin  -Current home glucose readings:  19-Oct 155  18-Oct 134  17-Oct 125  16-Oct 122  15-Oct 131  14-Oct 137  13-Oct 140  12-Oct 130  11-Oct 126  -Some spikes in blood sugar when she eats a later dinner.  -Denies  hypoglycemic symptoms -Consider optimizing Trulicity and starting SGLT2 inhibitor for CKD protection alongside decreasing insulin burden.  -Continue current medications  Atrial Fibrillation (Goal: prevent stroke and major bleeding) -Controlled -CHADSVASC: 5 -Current treatment: Rate control: Diltiazem CD 240 mg daily  Anticoagulation: Eliquis 5 mg twice daily  -Medications previously tried: NA -Home BP and HR readings: NA   -Counseled on avoidance of NSAIDs due to increased bleeding risk with anticoagulants; -Recommended to continue current medication  Asthma (Goal: control symptoms and prevent exacerbations) -Controlled -Current treatment  Proventil HFA 2 puffs every 6 hours as needed Advair 1 puff twice daily  -Medications previously tried: NA  -Exacerbations requiring treatment in last 6 months: None -Patient reports consistent use of maintenance inhaler -Frequency of  rescue inhaler use: NA -Counseled on When to use rescue inhaler -Recommended to continue current medication  Chronic Kidney Disease Stage 3a  -All medications assessed for renal dosing and appropriateness in chronic kidney disease. -Recommended to continue current medication  Patient Goals/Self-Care Activities Over the next 90 days, patient will:  - check glucose 3 times daily, before breakfast, supper, and bedtime, document, and provide at future appointments check blood pressure 2-3 times weekly, document, and provide at future appointments  Follow Up Plan: Telephone follow up appointment with care management team member scheduled for:  09/02/2021 at 8:30 AM     Medication Assistance:  Trulicity, Humalog obtained through [employer] medication assistance program.  Enrollment ends Dec 2022 Eliquis patient assistance is still pending. Patient denied until able to show out of pocket spending report.   Patient's preferred pharmacy is:  [facility] [place], [place] [address] Phone: [phone] Fax: [phone]  [facility] [address], [place] - [place] [address] [place] [address] Phone: [phone] Fax: [phone]  [facility] [place], [place] - [address] [place] [address] Phone: [phone] Fax: [phone]  Uses pill box? Yes Pt endorses 100%  compliance  We discussed: Current pharmacy is preferred with insurance plan and patient is satisfied with pharmacy services Patient decided to: Continue current medication management strategy  Care Plan and Follow Up Patient Decision:  Patient agrees to Care Plan and Follow-up.  Plan: Telephone follow up appointment with care management team member scheduled for:  09/02/2021 at 8:30 AM  [facility] [phone]

## 2021-06-22 DIAGNOSIS — N1831 Chronic kidney disease, stage 3a: Secondary | ICD-10-CM

## 2021-06-22 DIAGNOSIS — N183 Chronic kidney disease, stage 3 unspecified: Secondary | ICD-10-CM

## 2021-06-22 DIAGNOSIS — E1122 Type 2 diabetes mellitus with diabetic chronic kidney disease: Secondary | ICD-10-CM | POA: Diagnosis not present

## 2021-06-22 DIAGNOSIS — I129 Hypertensive chronic kidney disease with stage 1 through stage 4 chronic kidney disease, or unspecified chronic kidney disease: Secondary | ICD-10-CM

## 2021-06-22 DIAGNOSIS — Z794 Long term (current) use of insulin: Secondary | ICD-10-CM | POA: Diagnosis not present

## 2021-06-23 ENCOUNTER — Ambulatory Visit (INDEPENDENT_AMBULATORY_CARE_PROVIDER_SITE_OTHER): Payer: Medicare PPO

## 2021-06-23 DIAGNOSIS — Z0000 Encounter for general adult medical examination without abnormal findings: Secondary | ICD-10-CM

## 2021-06-23 NOTE — Progress Notes (Signed)
Subjective:   Amber Reyes is a 75 y.o. female who presents for Medicare Annual (Subsequent) preventive examination.  Virtual Visit via Telephone Note  I connected with  Amber Reyes on 06/23/21 at  8:00 AM EDT by telephone and verified that I am speaking with the correct person using two identifiers.  Location: Patient: home Provider: [place] Persons participating in the virtual visit: [place]   I discussed the limitations, risks, security and privacy concerns of performing an evaluation and management service by telephone and the availability of in person appointments. The patient expressed understanding and agreed to proceed.  Interactive audio and video telecommunications were attempted between this nurse and patient, however failed, due to patient having technical difficulties OR patient did not have access to video capability.  We continued and completed visit with audio only.  Some vital signs may be absent or patient reported.   Clemetine Marker, LPN   Review of Systems     Cardiac Risk Factors include: advanced age (>51mn, >>56women);diabetes mellitus;dyslipidemia;hypertension;obesity (BMI >30kg/m2)     Objective:    There were no vitals filed for this visit. There is no height or weight on file to calculate BMI.  Advanced Directives 06/23/2021 06/27/2020 06/19/2020 06/15/2019 04/13/2019 09/25/2018 03/25/2017  Does Patient Have a Medical Advance Directive? No No No No No No No  Would patient like information on creating a medical advance directive? Yes (MAU/Ambulatory/Procedural Areas - Information given) - Yes (MAU/Ambulatory/Procedural Areas - Information given) Yes (MAU/Ambulatory/Procedural Areas - Information given) No - Patient declined - -    Current Medications (verified) Outpatient Encounter Medications as of 06/23/2021  Medication Sig   acetaminophen (TYLENOL) 650 MG CR tablet Take 650 mg by mouth in the morning and at bedtime.    albuterol (PROVENTIL HFA;VENTOLIN HFA) 108 (90 BASE) MCG/ACT inhaler Inhale 2 puffs into the lungs every 6 (six) hours as needed for wheezing or shortness of breath.   allopurinol (ZYLOPRIM) 100 MG tablet Take 1 tablet (100 mg total) by mouth 2 (two) times daily.   Blood Glucose Calibration (TRUE METRIX LEVEL 1) Low SOLN 1 each by In Vitro route once a week.   Blood Glucose Monitoring Suppl (TRUE METRIX AIR GLUCOSE METER) w/Device KIT Inject 1 each into the skin 4 (four) times daily as needed. Check fsbs 4 times daily E11.22   Cholecalciferol (VITAMIN D) 2000 UNITS tablet Take 2,000 Units by mouth daily.   colchicine (COLCRYS) 0.6 MG tablet Take 1-2 tablets (0.6-1.2 mg total) by mouth daily as needed.   diltiazem (CARDIZEM CD) 240 MG 24 hr capsule Take 1 capsule (240 mg total) by mouth daily.   DROPLET PEN NEEDLES 30G X 8 MM MISC USE TWICE DAILY WITH INSULIN   Dulaglutide (TRULICITY) 1.5 MKJ/1.7HXSOPN Inject 1.5 mg into the skin once a week.   Elastic Bandages & Supports (MEDICAL COMPRESSION STOCKINGS) MISC 2 each by Does not apply route daily.   ELIQUIS 5 MG TABS tablet TAKE 1 TABLET TWICE DAILY   fluticasone (FLONASE) 50 MCG/ACT nasal spray Place 2 sprays into both nostrils daily.   fluticasone-salmeterol (ADVAIR) 250-50 MCG/ACT AEPB INHALE 1 PUFF BY MOUTH TWICE DAILY   furosemide (LASIX) 40 MG tablet TAKE 1 TABLET (40 MG TOTAL) BY MOUTH 2 (TWO) TIMES DAILY AS NEEDED.   glucose blood (TRUE METRIX BLOOD GLUCOSE TEST) test strip CHECK BLOOD SUGAR FOUR TIMES DAILY   Insulin Lispro Prot & Lispro (HUMALOG MIX 75/25 KWIKPEN) (75-25) 100 UNIT/ML Kwikpen Inject 10 Units  into the skin 2 (two) times daily with a meal.   irbesartan (AVAPRO) 300 MG tablet Take 1 tablet (300 mg total) by mouth daily.   pravastatin (PRAVACHOL) 80 MG tablet Take 1 tablet (80 mg total) by mouth every evening.   traMADol (ULTRAM) 50 MG tablet Take 1 tablet by mouth as needed.   TRUEplus Lancets 33G MISC 1 each by Does not apply  route 3 (three) times daily as needed.   potassium chloride SA (KLOR-CON) 20 MEQ tablet Take 1 tablet (20 mEq total) by mouth daily. With furosemide (Patient not taking: Reported on 06/23/2021)   No facility-administered encounter medications on file as of 06/23/2021.    Allergies (verified) Patient has no known allergies.   History: Past Medical History:  Diagnosis Date   Allergic rhinitis, cause unspecified    Anxiety    Chronic diastolic CHF (congestive heart failure) (HCC)    CKD (chronic kidney disease), stage III (HCC)    Edema    Essential hypertension, benign    Gout, unspecified    Heart murmur    as child   Hyperlipidemia    Lipoma of unspecified site    Other and unspecified disc disorder of lumbar region    Other general symptoms(780.99)    PAC (premature atrial contraction)    Renal insufficiency    Restless legs syndrome (RLS)    Sebaceous cyst    Super obesity    Type II or unspecified type diabetes mellitus without mention of complication, uncontrolled    Unspecified asthma(493.90)    Unspecified disease of pericardium    Unspecified sleep apnea    Unspecified vitamin D deficiency    Vaginitis and vulvovaginitis, unspecified    Venous insufficiency    Past Surgical History:  Procedure Laterality Date   CARDIAC CATHETERIZATION  2002   [facility]   COLONOSCOPY WITH PROPOFOL N/A 09/25/2018   Procedure: COLONOSCOPY WITH PROPOFOL;  Surgeon: Jonathon Bellows, MD;  [facility] ENDOSCOPY;  Service: Gastroenterology;  Laterality: N/A;   COLONOSCOPY WITH PROPOFOL N/A 04/13/2019   Procedure: COLONOSCOPY WITH PROPOFOL;  Surgeon: Jonathon Bellows, MD;  Location: [facility] ENDOSCOPY;  Service: Gastroenterology;  Laterality: N/A;   COLONOSCOPY WITH PROPOFOL N/A 06/27/2020   Procedure: COLONOSCOPY WITH PROPOFOL;  Surgeon: Jonathon Bellows, MD;  Location: [facility] ENDOSCOPY;  Service: Gastroenterology;  Laterality: N/A;   PERICARDIUM SURGERY     Family History  Problem Relation Age of Onset   Heart  attack Mother    Hypertension Mother    Breast cancer Neg Hx    Social History   Socioeconomic History   Marital status: Married    Spouse name: Not on file   Number of children: 3   Years of education: Not on file   Highest education level: Associate degree: academic program  Occupational History   Occupation: retired  Tobacco Use   Smoking status: Never   Smokeless tobacco: Never   Tobacco comments:    n/a  Vaping Use   Vaping Use: Never used  Substance and Sexual Activity   Alcohol use: No    Alcohol/week: 0.0 standard drinks   Drug use: No   Sexual activity: Not Currently  Other Topics Concern   Not on file  Social History Narrative   Not on file   Social Determinants of Health   Financial Resource Strain: Medium Risk   Difficulty of Paying Living Expenses: Somewhat hard  Food Insecurity: No Food Insecurity   Worried About [place] in the Last  Year: Never true   [place] in the Last Year: Never true  Transportation Needs: No Transportation Needs   Lack of Transportation (Medical): No   Lack of Transportation (Non-Medical): No  Physical Activity: Inactive   Days of Exercise per Week: 0 days   Minutes of Exercise per Session: 0 min  Stress: No Stress Concern Present   Feeling of Stress : Not at all  Social Connections: Socially Integrated   Frequency of Communication with Friends and Family: More than three times a week   Frequency of Social Gatherings with Friends and Family: Once a week   Attends Religious Services: More than 4 times per year   Active Member of [employer] or Organizations: Yes   Attends Music therapist: More than 4 times per year   Marital Status: Married    Tobacco Counseling Counseling given: Not Answered Tobacco comments: n/a   Clinical Intake:  Pre-visit preparation completed: Yes  Pain : No/denies pain     Nutritional Risks: None Diabetes: Yes CBG done?: No Did pt. bring in CBG monitor from  home?: No  How often do you need to have someone help you when you read instructions, pamphlets, or other written materials from your doctor or pharmacy?: 1 - Never  Nutrition Risk Assessment:  Has the patient had any N/V/D within the last 2 months?  No  Does the patient have any non-healing wounds?  No  Has the patient had any unintentional weight loss or weight gain?  No   Diabetes:  Is the patient diabetic?  Yes  If diabetic, was a CBG obtained today?  No  Did the patient bring in their glucometer from home?  No  How often do you monitor your CBG's? Twice daily.   Financial Strains and Diabetes Management:  Are you having any financial strains with the device, your supplies or your medication? No .  Does the patient want to be seen by Chronic Care Management for management of their diabetes?  Yes - already enrolled Would the patient like to be referred to a Nutritionist or for Diabetic Management?  No   Diabetic Exams:  Diabetic Eye Exam: Completed 08/18/20 negative retinopathy.   Diabetic Foot Exam: Completed 11/19/20.   Interpreter Needed?: No  Information entered by :: Clemetine Marker LPN   Activities of Daily Living In your present state of health, do you have any difficulty performing the following activities: 06/23/2021 03/09/2021  Hearing? N N  Vision? N N  Difficulty concentrating or making decisions? N N  Walking or climbing stairs? Y Y  Dressing or bathing? N N  Doing errands, shopping? N Y  Conservation officer, nature and eating ? N -  Using the Toilet? N -  In the past six months, have you accidently leaked urine? N -  Do you have problems with loss of bowel control? N -  Managing your Medications? N -  Managing your Finances? N -  Housekeeping or managing your Housekeeping? N -  Some recent data might be hidden    Patient Care Team: Steele Sizer, MD as PCP - General (Family Medicine) Wellington Hampshire, MD as Consulting Physician (Cardiology) Germaine Pomfret,  [facility] as Pharmacist (Pharmacist)  Indicate any recent Medical Services you may have received from other than [facility] providers in the past year (date may be approximate).     Assessment:   This is a routine wellness examination for [place].  Hearing/Vision screen Hearing Screening - Comments:: Pt denies hearing  difficulty Vision Screening - Comments:: [facility] screenings [facility] Dr. Michelene Heady   Dietary issues and exercise activities discussed: Current Exercise Habits: The patient does not participate in regular exercise at present, Exercise limited by: orthopedic condition(s)   Goals Addressed   None    Depression Screen PHQ 2/9 Scores 06/23/2021 03/09/2021 11/19/2020 06/19/2020 05/12/2020 02/06/2020 10/08/2019  PHQ - 2 Score 0 0 0 0 0 0 0  PHQ- 9 Score - - - - 2 0 0    Fall Risk Fall Risk  06/23/2021 03/09/2021 11/19/2020 06/19/2020 05/12/2020  Falls in the past year? 0 0 0 0 0  Number falls in past yr: 0 0 0 0 0  Injury with Fall? 0 0 0 0 0  Risk for fall due to : No Fall Risks - - No Fall Risks -  Follow up Falls prevention discussed - - Falls prevention discussed -    FALL RISK PREVENTION PERTAINING TO THE HOME:  Any stairs in or around the home? Yes  If so, are there any without handrails? No  Home free of loose throw rugs in walkways, pet beds, electrical cords, etc? Yes  Adequate lighting in your home to reduce risk of falls? Yes   ASSISTIVE DEVICES UTILIZED TO PREVENT FALLS:  Life alert? No  Use of a cane, walker or w/c? Yes  Grab bars in the bathroom? Yes  Shower chair or bench in shower? No  Elevated toilet seat or a handicapped toilet? Yes   TIMED UP AND GO:  Was the test performed? No .  Telephonic visit.   Cognitive Function: Normal cognitive status assessed by direct observation by this Nurse Health Advisor. No abnormalities found.       6CIT Screen 06/15/2019  What Year? 0 points  What month? 0 points  What time? 0 points  Count back  from 20 0 points  Months in reverse 0 points  Repeat phrase 4 points  Total Score 4    Immunizations Immunization History  Administered Date(s) Administered   Fluad Quad(high Dose 65+) 05/14/2019, 05/12/2020   Influenza Split 05/16/2007   Influenza, High Dose Seasonal PF 06/24/2017, 04/25/2018   Influenza, Seasonal, Injecte, Preservative Fre 05/07/2010, 04/27/2011   Influenza,inj,Quad PF,6+ Mos 08/02/2014, 04/15/2015, 05/22/2016   Influenza-Unspecified 08/02/2014   Moderna Sars-Covid-2 Vaccination 09/28/2019, 10/26/2019, 07/02/2020   Pneumococcal Conjugate-13 08/02/2014   Pneumococcal Polysaccharide-23 04/27/2011, 06/04/2016   Tdap 09/29/2012   Zoster, Live 09/29/2012    TDAP status: Up to date  Flu Vaccine status: Due, Education has been provided regarding the importance of this vaccine. Advised may receive this vaccine at local pharmacy or Health Dept. Aware to provide a copy of the vaccination record if obtained from local pharmacy or Health Dept. Verbalized acceptance and understanding.  Pneumococcal vaccine status: Up to date  Covid-19 vaccine status: Completed vaccines  Qualifies for Shingles Vaccine? Yes   Zostavax completed Yes   Shingrix Completed?: No.    Education has been provided regarding the importance of this vaccine. Patient has been advised to call insurance company to determine out of pocket expense if they have not yet received this vaccine. Advised may also receive vaccine at local pharmacy or Health Dept. Verbalized acceptance and understanding.  Screening Tests Health Maintenance  Topic Date Due   Zoster Vaccines- Shingrix (1 of 2) Never done   COVID-19 Vaccine (4 - Booster for Moderna series) 08/27/2020   INFLUENZA VACCINE  03/23/2021   OPHTHALMOLOGY EXAM  08/18/2021   HEMOGLOBIN A1C  09/09/2021   MAMMOGRAM  11/17/2021   FOOT EXAM  11/19/2021   TETANUS/TDAP  09/29/2022   COLONOSCOPY (Pts 45-38yr Insurance coverage will need to be confirmed)   06/28/2023   Pneumonia Vaccine 75 Years old  Completed   DEXA SCAN  Completed   Hepatitis C Screening  Completed   HPV VACCINES  Aged Out    Health Maintenance  Health Maintenance Due  Topic Date Due   Zoster Vaccines- Shingrix (1 of 2) Never done   COVID-19 Vaccine (4 - Booster for Moderna series) 08/27/2020   INFLUENZA VACCINE  03/23/2021    Colorectal cancer screening: Type of screening: Colonoscopy. Completed 06/27/20. Repeat every 3 years  Mammogram status: Completed 11/17/20. Repeat every year  Bone density screening: done 2016; pt declines repeat screening at this time  Lung Cancer Screening: (Low Dose CT Chest recommended if Age 73-80years, 30 pack-year currently smoking OR have quit w/in 15years.) does not qualify.   Additional Screening:  Hepatitis C Screening: does qualify; Completed 04/02/13  Vision Screening: Recommended annual ophthalmology exams for early detection of glaucoma and other disorders of the eye. Is the patient up to date with their annual eye exam?  Yes  Who is the provider or what [facility] the [facility]   Dental Screening: Recommended annual dental exams for proper oral hygiene  Community Resource Referral / Chronic Care Management: CRR required this visit?  No   CCM required this visit?  No      Plan:     I have personally reviewed and noted the following in the patient's chart:   Medical and social history Use of alcohol, tobacco or illicit drugs  Current medications and supplements including opioid prescriptions.  Functional ability and status Nutritional status Physical activity Advanced directives List of other physicians Hospitalizations, surgeries, and ER visits in previous 12 months Vitals Screenings to include cognitive, depression, and falls Referrals and appointments  In addition, I have reviewed and discussed with patient certain preventive  protocols, quality metrics, and best practice recommendations. A written personalized care plan for preventive services as well as general preventive health recommendations were provided to patient.     KClemetine Marker LPN   [id number]  Nurse Notes: none

## 2021-06-23 NOTE — Patient Instructions (Signed)
Amber Reyes , Thank you for taking time to come for your Medicare Wellness Visit. I appreciate your ongoing commitment to your health goals. Please review the following plan we discussed and let me know if I can assist you in the future.   Screening recommendations/referrals: Colonoscopy: done 06/27/20. Repeat 06/2023 Mammogram: done 11/17/20 Bone Density: done 2016 Recommended yearly ophthalmology/optometry visit for glaucoma screening and checkup Recommended yearly dental visit for hygiene and checkup  Vaccinations: Influenza vaccine: due Pneumococcal vaccine: done 06/04/16 Tdap vaccine: done 09/29/12 Shingles vaccine: Shingrix discussed. Please contact your pharmacy for coverage information.  Covid-19: done 09/28/19, 10/26/19 & 07/02/20  Advanced directives: Advance directive discussed with you today. I have provided a copy for you to complete at home and have notarized. Once this is complete please bring a copy in to our office so we can scan it into your chart.   Conditions/risks identified: Recommend increasing physical activity as tolerated  Next appointment: Follow up in one year for your annual wellness visit    Preventive Care 65 Years and Older, Female Preventive care refers to lifestyle choices and visits with your health care provider that can promote health and wellness. What does preventive care include? A yearly physical exam. This is also called an annual well check. Dental exams once or twice a year. Routine eye exams. Ask your health care provider how often you should have your eyes checked. Personal lifestyle choices, including: Daily care of your teeth and gums. Regular physical activity. Eating a healthy diet. Avoiding tobacco and drug use. Limiting alcohol use. Practicing safe sex. Taking low-dose aspirin every day. Taking vitamin and mineral supplements as recommended by your health care provider. What happens during an annual well check? The services and  screenings done by your health care provider during your annual well check will depend on your age, overall health, lifestyle risk factors, and family history of disease. Counseling  Your health care provider may ask you questions about your: Alcohol use. Tobacco use. Drug use. Emotional well-being. Home and relationship well-being. Sexual activity. Eating habits. History of falls. Memory and ability to understand (cognition). Work and work Statistician. Reproductive health. Screening  You may have the following tests or measurements: Height, weight, and BMI. Blood pressure. Lipid and cholesterol levels. These may be checked every 5 years, or more frequently if you are over 63 years old. Skin check. Lung cancer screening. You may have this screening every year starting at age 25 if you have a 30-pack-year history of smoking and currently smoke or have quit within the past 15 years. Fecal occult blood test (FOBT) of the stool. You may have this test every year starting at age 84. Flexible sigmoidoscopy or colonoscopy. You may have a sigmoidoscopy every 5 years or a colonoscopy every 10 years starting at age 56. Hepatitis C blood test. Hepatitis B blood test. Sexually transmitted disease (STD) testing. Diabetes screening. This is done by checking your blood sugar (glucose) after you have not eaten for a while (fasting). You may have this done every 1-3 years. Bone density scan. This is done to screen for osteoporosis. You may have this done starting at age 7. Mammogram. This may be done every 1-2 years. Talk to your health care provider about how often you should have regular mammograms. Talk with your health care provider about your test results, treatment options, and if necessary, the need for more tests. Vaccines  Your health care provider may recommend certain vaccines, such as: Influenza vaccine. This is  recommended every year. Tetanus, diphtheria, and acellular pertussis (Tdap,  Td) vaccine. You may need a Td booster every 10 years. Zoster vaccine. You may need this after age 31. Pneumococcal 13-valent conjugate (PCV13) vaccine. One dose is recommended after age 31. Pneumococcal polysaccharide (PPSV23) vaccine. One dose is recommended after age 89. Talk to your health care provider about which screenings and vaccines you need and how often you need them. This information is not intended to replace advice given to you by your health care provider. Make sure you discuss any questions you have with your health care provider. Document Released: 09/05/2015 Document Revised: 04/28/2016 Document Reviewed: 06/10/2015 Elsevier Interactive Patient Education  2017 [place] Prevention in the Home Falls can cause injuries. They can happen to people of all ages. There are many things you can do to make your home safe and to help prevent falls. What can I do on the outside of my home? Regularly fix the edges of walkways and driveways and fix any cracks. Remove anything that might make you trip as you walk through a door, such as a raised step or threshold. Trim any bushes or trees on the path to your home. Use bright outdoor lighting. Clear any walking paths of anything that might make someone trip, such as rocks or tools. Regularly check to see if handrails are loose or broken. Make sure that both sides of any steps have handrails. Any raised decks and porches should have guardrails on the edges. Have any leaves, snow, or ice cleared regularly. Use sand or salt on walking paths during winter. Clean up any spills in your garage right away. This includes oil or grease spills. What can I do in the bathroom? Use night lights. Install grab bars by the toilet and in the tub and shower. Do not use towel bars as grab bars. Use non-skid mats or decals in the tub or shower. If you need to sit down in the shower, use a plastic, non-slip stool. Keep the floor dry. Clean up any  water that spills on the floor as soon as it happens. Remove soap buildup in the tub or shower regularly. Attach bath mats securely with double-sided non-slip rug tape. Do not have throw rugs and other things on the floor that can make you trip. What can I do in the bedroom? Use night lights. Make sure that you have a light by your bed that is easy to reach. Do not use any sheets or blankets that are too big for your bed. They should not hang down onto the floor. Have a firm chair that has side arms. You can use this for support while you get dressed. Do not have throw rugs and other things on the floor that can make you trip. What can I do in the kitchen? Clean up any spills right away. Avoid walking on wet floors. Keep items that you use a lot in easy-to-reach places. If you need to reach something above you, use a strong step stool that has a grab bar. Keep electrical cords out of the way. Do not use floor polish or wax that makes floors slippery. If you must use wax, use non-skid floor wax. Do not have throw rugs and other things on the floor that can make you trip. What can I do with my stairs? Do not leave any items on the stairs. Make sure that there are handrails on both sides of the stairs and use them. Fix handrails that are  broken or loose. Make sure that handrails are as long as the stairways. Check any carpeting to make sure that it is firmly attached to the stairs. Fix any carpet that is loose or worn. Avoid having throw rugs at the top or bottom of the stairs. If you do have throw rugs, attach them to the floor with carpet tape. Make sure that you have a light switch at the top of the stairs and the bottom of the stairs. If you do not have them, ask someone to add them for you. What else can I do to help prevent falls? Wear shoes that: Do not have high heels. Have rubber bottoms. Are comfortable and fit you well. Are closed at the toe. Do not wear sandals. If you use a  stepladder: Make sure that it is fully opened. Do not climb a closed stepladder. Make sure that both sides of the stepladder are locked into place. Ask someone to hold it for you, if possible. Clearly mark and make sure that you can see: Any grab bars or handrails. First and last steps. Where the edge of each step is. Use tools that help you move around (mobility aids) if they are needed. These include: Canes. Walkers. Scooters. Crutches. Turn on the lights when you go into a dark area. Replace any light bulbs as soon as they burn out. Set up your furniture so you have a clear path. Avoid moving your furniture around. If any of your floors are uneven, fix them. If there are any pets around you, be aware of where they are. Review your medicines with your doctor. Some medicines can make you feel dizzy. This can increase your chance of falling. Ask your doctor what other things that you can do to help prevent falls. This information is not intended to replace advice given to you by your health care provider. Make sure you discuss any questions you have with your health care provider. Document Released: 06/05/2009 Document Revised: 01/15/2016 Document Reviewed: 09/13/2014 Elsevier Interactive Patient Education  2017 [employer].

## 2021-06-24 ENCOUNTER — Ambulatory Visit (INDEPENDENT_AMBULATORY_CARE_PROVIDER_SITE_OTHER): Payer: Medicare PPO

## 2021-06-24 DIAGNOSIS — N183 Chronic kidney disease, stage 3 unspecified: Secondary | ICD-10-CM

## 2021-06-24 DIAGNOSIS — Z794 Long term (current) use of insulin: Secondary | ICD-10-CM

## 2021-06-24 NOTE — Progress Notes (Signed)
[employer] Patient Assistance form for Trulicity and Humalog 80/99 completed by patient and faxed for review on 06/24/21  Malva Limes, [facility] [phone]

## 2021-06-25 NOTE — Patient Instructions (Signed)
Visit Information It was great speaking with you today!  Please let me know if you have any questions about our visit.   Goals Addressed             This Visit's Progress    Monitor and Manage My Blood Sugar-Diabetes Type 2   On track    Timeframe:  Long-Range Goal Priority:  High Start Date:  10/29/2020                           Expected End Date:  05/01/2022                     Follow Up within 90 days   -check blood sugar three times daily: Before breakfast, before supper, and at bedtime - check blood sugar if I feel it is too high or too low - enter blood sugar readings and medication or insulin into daily log    Why is this important?   Checking your blood sugar at home helps to keep it from getting very high or very low.  Writing the results in a diary or log helps the doctor know how to care for you.  Your blood sugar log should have the time, date and the results.  Also, write down the amount of insulin or other medicine that you take.  Other information, like what you ate, exercise done and how you were feeling, will also be helpful.     Notes:      Track and Manage My Blood Pressure-Hypertension   On track    Timeframe:  Long-Range Goal Priority:  High Start Date:  12/17/2020                            Expected End Date:  06/18/2022                    Follow Up within 90 days   - check blood pressure 3 times per week    Why is this important?   You won't feel high blood pressure, but it can still hurt your blood vessels.  High blood pressure can cause heart or kidney problems. It can also cause a stroke.  Making lifestyle changes like losing a little weight or eating less salt will help.  Checking your blood pressure at home and at different times of the day can help to control blood pressure.  If the doctor prescribes medicine remember to take it the way the doctor ordered.  Call the office if you cannot afford the medicine or if there are questions about it.      Notes:         Patient Care Plan: General Pharmacy (Adult)     Problem Identified: Hypertension, Hyperlipidemia, Diabetes, Atrial Fibrillation, Asthma, Chronic Kidney Disease and Gout   Priority: High     Long-Range Goal: Patient-Specific Goal   Start Date: 10/29/2020  Expected End Date: 04/16/2022  This Visit's Progress: On track  Recent Progress: On track  Priority: High  Note:   Current Barriers:  Unable to independently afford treatment regimen Suboptimal therapeutic regimen for diabetes  Pharmacist Clinical Goal(s):  Over the next 90 days, patient will maintain control of diabetes as evidenced by A1c less than 8%  through collaboration with PharmD and provider.   Interventions: 1:1 collaboration with Steele Sizer, MD regarding development and update of comprehensive plan of  care as evidenced by provider attestation and co-signature Inter-disciplinary care team collaboration (see longitudinal plan of care) Comprehensive medication review performed; medication list updated in electronic medical record  Hypertension (BP goal <140/90) -Controlled -Current treatment: Diltiazem CD 240 mg daily (AM)  Furosemide 40 mg twice daily as needed (uses every other day)  Irbersartan 300 mg daily (PM) -Medications previously tried: NA  -Current home readings (Prior to medication):    138/58, Pulse 54   138/56, Pulse 74   156/73, Pulse 72   134/70, Pulse 74   131/61   135/58   -Current dietary habits: She has been eating less, trying to follow a low-carb, low-sodium diet.   -Current exercise habits: Unable to walk long distances.  -Denies hypotensive/hypertensive symptoms -Stop -Recommended to continue current medication  Hyperlipidemia: (LDL goal < 70) -Controlled -Current treatment: Pravastatin 80 mg daily  -Medications previously tried: NA  -Educated on Importance of limiting foods high in cholesterol; -Recommended to continue current medication  Diabetes (A1c goal  <8%) -Controlled -Current medications: Trulicity 1.5 mg weekly  Humalog 75-25 10 units before breakfast, 10 units before supper  -Medications previously tried: Metformin  -Current home glucose readings:  19-Oct 155  18-Oct 134  17-Oct 125  16-Oct 122  15-Oct 131  14-Oct 137  13-Oct 140  12-Oct 130  11-Oct 126  -Some spikes in blood sugar when she eats a later dinner.  -Denies  hypoglycemic symptoms -Consider optimizing Trulicity and starting SGLT2 inhibitor for CKD protection alongside decreasing insulin burden.  -Continue current medications  Atrial Fibrillation (Goal: prevent stroke and major bleeding) -Controlled -CHADSVASC: 5 -Current treatment: Rate control: Diltiazem CD 240 mg daily  Anticoagulation: Eliquis 5 mg twice daily  -Medications previously tried: NA -Home BP and HR readings: NA  -Counseled on avoidance of NSAIDs due to increased bleeding risk with anticoagulants; -Recommended to continue current medication  Asthma (Goal: control symptoms and prevent exacerbations) -Controlled -Current treatment  Proventil HFA 2 puffs every 6 hours as needed Advair 1 puff twice daily  -Medications previously tried: NA  -Exacerbations requiring treatment in last 6 months: None -Patient reports consistent use of maintenance inhaler -Frequency of rescue inhaler use: NA -Counseled on When to use rescue inhaler -Recommended to continue current medication  Chronic Kidney Disease Stage 3a  -All medications assessed for renal dosing and appropriateness in chronic kidney disease. -Recommended to continue current medication  Patient Goals/Self-Care Activities Over the next 90 days, patient will:  - check glucose 3 times daily, before breakfast, supper, and bedtime, document, and provide at future appointments check blood pressure 2-3 times weekly, document, and provide at future appointments  Follow Up Plan: Telephone follow up appointment with care management team member  scheduled for:  09/02/2021 at 8:30 AM    Patient agreed to services and verbal consent obtained.   Patient verbalizes understanding of instructions provided today and agrees to view in [place].   Malva Limes, [facility] [phone]

## 2021-07-13 NOTE — Progress Notes (Signed)
Name: Amber Reyes   MRN: [id number]    DOB: 02-20-1946   Date:07/14/2021       Progress Note  Subjective  Chief Complaint  Follow Up  HPI  DMII controlled: She has been compliant with medication, she is on  Humalog 75/25 since  Feb 2018, also started on Trulicity in 4098 [id number] was 6.5%  6.7% ,6.9%, 6.8 % the last two times it was 7 %   Taking Avapro for microalbuminuria,  she is on statin for dyslipidemia, she has diabetes associated with obesity. FSB at home has been between 107-140's, but usually around 120's  No polyphagia, polydipsia or polyuria. She has lost another 11 lbs since last visit, she states she is cutting down on bread and stopped sodas.    HTN/CHF/Afib/Pulmonary hypertension : taking medications and denies side effects of medications, bp today was slightly up , she brought her bp logs and it is usually under control at home has been around 130's-150's She denies chest pain no recent palpitation, found to have Afib on EKG done by Dr. Fletcher Anon and takes Cardizem for rate control   She has orthopnea - uses two pillows , SOB is stable.  Echo done 06/2018 LV function is normal and unable to assess diastolic dysfunction, severe left  atrium dilation , mild elevation of Pulmonary pressure. She takes Eliquis and has been compliant with medication. Symptoms have been stable. Advised her to bring her bp to calibrate with ours.    Hyperlipidemia: taking Pravastatin, we will continue current medication , reviewed labs done 04/2020 LDL was at goal at 52 . We will recheck labs today   RLS: symptoms are still present, described as soreness on both legs at night, improves when she rubs her legs. Unchanged    CKIII: she is going to see nephrologist at [facility], last labs reviewed still stage III CKI , GFR stable, last level done 10/2020 was 51 , no itching , good urine output, she is not taking potassium because she had hyperkalemia - but avoiding lasix, advised to take one potassium daily when she  takes lasix  , she has secondary hyperparathyroidism and is due for labs today    OSA: she was wearing CPAP machine every night, wakes up feeling rested, but has dry mouth, she has humidifier    Morbid Obesity: she gets Trulicity through [employer] and she is losing weight, down another 11 lbs since last vsiit,  she is trying to avoid carbohydrates, eating more vegetables and trying to be more active    Asthma Moderate persistent/Chronic bronchitis: . She is compliant and uses Advair twice daily as prescribed, no side effects. No wheezing , morning cough not as frequent now that she has been using a new CPAP machine, but still has  sob only with activity that is stable . Unchanged    Atherosclerosis of aorta and echo showed mild dilation of ascending aorta, continue statin and eliquis. We will recheck labs today   Chronic back pain: she cannot stand or walk for a long time because increases the pain, dull pain, no radiculitis. She states Tylenol controls symptoms . Stable   Anemia: normal iron studies. Anemia of chronic disease , and is due for labs   Patient Active Problem List   Diagnosis Date Noted   Localized, 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/22/2021   Pain in joint of left shoulder 04/22/2021   Tendonitis of shoulder, left 04/22/2021   Hypertension associated with stage 3 chronic kidney disease  due to type 2 diabetes mellitus ([place]) 02/06/2020   History of colonic polyps    Polyp of colon    Thoracic aortic atherosclerosis ([place]) 12/30/2016   Controlled type 2 diabetes mellitus with stage 3 chronic kidney disease ([place]) 12/23/2016   Venous insufficiency 05/21/2016   Hypertensive heart disease 05/21/2016   Anemia in chronic kidney disease 12/22/2015   Pulmonary hypertension ([place]) 12/18/2015   Xanthelasma 04/15/2015   Anxiety 04/12/2015   Chronic kidney disease (CKD), stage III (moderate) (HCC) 04/12/2015   Edema extremities 04/12/2015   Disc disorder of lumbar  region 04/12/2015   Asthma, moderate persistent 04/12/2015   Morbid obesity, unspecified obesity type ([place]) 04/12/2015   Obstructive apnea 04/12/2015   Restless leg 04/12/2015   Allergic rhinitis 04/12/2015   Vitamin D deficiency 04/12/2015   Controlled gout 04/12/2015   Premature atrial contractions 11/29/2013   Atrial fibrillation ([place]) 11/09/2013   Benign essential hypertension    Hyperlipidemia     Past Surgical History:  Procedure Laterality Date   CARDIAC CATHETERIZATION  2002   [facility]   COLONOSCOPY WITH PROPOFOL N/A 09/25/2018   Procedure: COLONOSCOPY WITH PROPOFOL;  Surgeon: Jonathon Bellows, MD;  Location: [facility] ENDOSCOPY;  Service: Gastroenterology;  Laterality: N/A;   COLONOSCOPY WITH PROPOFOL N/A 04/13/2019   Procedure: COLONOSCOPY WITH PROPOFOL;  Surgeon: Jonathon Bellows, MD;  Location: [facility] ENDOSCOPY;  Service: Gastroenterology;  Laterality: N/A;   COLONOSCOPY WITH PROPOFOL N/A 06/27/2020   Procedure: COLONOSCOPY WITH PROPOFOL;  Surgeon: Jonathon Bellows, MD;  Location: [facility] ENDOSCOPY;  Service: Gastroenterology;  Laterality: N/A;   PERICARDIUM SURGERY      Family History  Problem Relation Age of Onset   Heart attack Mother    Hypertension Mother    Breast cancer Neg Hx     Social History   Tobacco Use   Smoking status: Never   Smokeless tobacco: Never   Tobacco comments:    n/a  Substance Use Topics   Alcohol use: No    Alcohol/week: 0.0 standard drinks     Current Outpatient Medications:    acetaminophen (TYLENOL) 650 MG CR tablet, Take 650 mg by mouth in the morning and at bedtime., Disp: , Rfl:    albuterol (PROVENTIL HFA;VENTOLIN HFA) 108 (90 BASE) MCG/ACT inhaler, Inhale 2 puffs into the lungs every 6 (six) hours as needed for wheezing or shortness of breath., Disp: , Rfl:    Blood Glucose Calibration (TRUE METRIX LEVEL 1) Low SOLN, 1 each by In Vitro route once a week., Disp: 1 each, Rfl: 1   Blood Glucose Monitoring Suppl (TRUE METRIX AIR GLUCOSE METER) w/Device  KIT, Inject 1 each into the skin 4 (four) times daily as needed. Check fsbs 4 times daily E11.22, Disp: 1 kit, Rfl: 0   Cholecalciferol (VITAMIN D) 2000 UNITS tablet, Take 2,000 Units by mouth daily., Disp: , Rfl:    colchicine (COLCRYS) 0.6 MG tablet, Take 1-2 tablets (0.6-1.2 mg total) by mouth daily as needed., Disp: 30 tablet, Rfl: 0   diltiazem (CARDIZEM CD) 240 MG 24 hr capsule, Take 1 capsule (240 mg total) by mouth daily., Disp: 90 capsule, Rfl: 1   DROPLET PEN NEEDLES 30G X 8 MM MISC, USE TWICE DAILY WITH INSULIN, Disp: 200 each, Rfl: 3   Dulaglutide (TRULICITY) 1.5 AF/7.9UX SOPN, Inject 1.5 mg into the skin once a week., Disp: 6 mL, Rfl: 3   Elastic Bandages & Supports (MEDICAL COMPRESSION STOCKINGS) MISC, 2 each by Does not apply route daily., Disp: 2 each, Rfl: 2  ELIQUIS 5 MG TABS tablet, TAKE 1 TABLET TWICE DAILY, Disp: 180 tablet, Rfl: 3   fluticasone (FLONASE) 50 MCG/ACT nasal spray, Place 2 sprays into both nostrils daily., Disp: 48 g, Rfl: 2   furosemide (LASIX) 40 MG tablet, TAKE 1 TABLET (40 MG TOTAL) BY MOUTH 2 (TWO) TIMES DAILY AS NEEDED., Disp: 180 tablet, Rfl: 1   glucose blood (TRUE METRIX BLOOD GLUCOSE TEST) test strip, CHECK BLOOD SUGAR FOUR TIMES DAILY, Disp: 400 strip, Rfl: 0   Insulin Lispro Prot & Lispro (HUMALOG MIX 75/25 KWIKPEN) (75-25) 100 UNIT/ML Kwikpen, Inject 10 Units into the skin 2 (two) times daily with a meal., Disp: 45 mL, Rfl: 0   TRUEplus Lancets 33G MISC, 1 each by Does not apply route 3 (three) times daily as needed., Disp: 100 each, Rfl: 3   allopurinol (ZYLOPRIM) 100 MG tablet, Take 1 tablet (100 mg total) by mouth 2 (two) times daily., Disp: 180 tablet, Rfl: 1   fluticasone-salmeterol (ADVAIR) 250-50 MCG/ACT AEPB, INHALE 1 PUFF BY MOUTH TWICE DAILY, Disp: 180 each, Rfl: 1   irbesartan (AVAPRO) 300 MG tablet, Take 1 tablet (300 mg total) by mouth daily., Disp: 90 tablet, Rfl: 1   potassium chloride SA (KLOR-CON) 20 MEQ tablet, Take 1 tablet (20 mEq  total) by mouth daily. With furosemide, Disp: 90 tablet, Rfl: 1   pravastatin (PRAVACHOL) 80 MG tablet, Take 1 tablet (80 mg total) by mouth every evening., Disp: 90 tablet, Rfl: 1  No Known Allergies  I personally reviewed active problem list, medication list, allergies, family history, social history, health maintenance with the patient/caregiver today.   ROS  Constitutional: Negative for fever positive weight change.  Respiratory: positive for cough and shortness of breath.   Cardiovascular: Negative for chest pain or palpitations.  Gastrointestinal: Negative for abdominal pain, no bowel changes.  Musculoskeletal: positive for gait problem or joint swelling.  Skin: Negative for rash.  Neurological: Negative for dizziness or headache.  No other specific complaints in a complete review of systems (except as listed in HPI above).   Objective  Vitals:   07/14/21 0804 07/14/21 0819  BP: (!) 142/70 138/68  Pulse: 93   Resp: 16   Temp: 98.1 F (36.7 C)   SpO2: 99%   Weight: (!) 354 lb (160.6 kg)   Height: '5\' 10"'  (1.778 m)     Body mass index is 50.79 kg/m.  Physical Exam  Constitutional: Patient appears well-developed and well-nourished. Obese  No distress.  HEENT: head atraumatic, normocephalic, pupils equal and reactive to light, neck supple Cardiovascular: Normal rate, regular rhythm and normal heart sounds.  No murmur heard. Trace  BLE edema. Pulmonary/Chest: Effort normal and breath sounds normal. No respiratory distress. Abdominal: Soft.  There is no tenderness. Psychiatric: Patient has a normal mood and affect. behavior is normal. Judgment and thought content normal.   Recent Results (from the past 2160 hour(s))  POCT HgB A1C     Status: Abnormal   Collection Time: 07/14/21  8:18 AM  Result Value Ref Range   Hemoglobin A1C 7.0 (A) 4.0 - 5.6 %   HbA1c POC (<> result, manual entry)     HbA1c, POC (prediabetic range)     HbA1c, POC (controlled diabetic range)        PHQ2/9: Depression screen [facility] 2/9 07/14/2021 06/23/2021 03/09/2021 11/19/2020 06/19/2020  Decreased Interest 0 0 0 0 0  Down, Depressed, Hopeless 0 0 0 0 0  PHQ - 2 Score 0 0 0 0 0  Altered sleeping 0 - - - -  Tired, decreased energy 0 - - - -  Change in appetite 0 - - - -  Feeling bad or failure about yourself  0 - - - -  Trouble concentrating 0 - - - -  Moving slowly or fidgety/restless 0 - - - -  Suicidal thoughts 0 - - - -  PHQ-9 Score 0 - - - -  Difficult doing work/chores - - - - -  Some recent data might be hidden    phq 9 is negative   Fall Risk: Fall Risk  07/14/2021 06/23/2021 03/09/2021 11/19/2020 06/19/2020  Falls in the past year? 0 0 0 0 0  Number falls in past yr: 0 0 0 0 0  Injury with Fall? 0 0 0 0 0  Risk for fall due to : No Fall Risks No Fall Risks - - No Fall Risks  Follow up Falls prevention discussed Falls prevention discussed - - Falls prevention discussed      Functional Status Survey: Is the patient deaf or have difficulty hearing?: No Does the patient have difficulty seeing, even when wearing glasses/contacts?: No Does the patient have difficulty concentrating, remembering, or making decisions?: No Does the patient have difficulty walking or climbing stairs?: Yes Does the patient have difficulty dressing or bathing?: No Does the patient have difficulty doing errands alone such as visiting a doctor's office or shopping?: No    Assessment & Plan  1. Controlled type 2 diabetes mellitus with stage 3 chronic kidney disease, with long-term current use of insulin (HCC)  - POCT HgB A1C  2. Need for immunization against influenza  - Flu Vaccine QUAD High Dose(Fluad)  3. Dyslipidemia associated with type 2 diabetes mellitus (HCC)  - Lipid panel - COMPLETE METABOLIC PANEL WITH GFR - pravastatin (PRAVACHOL) 80 MG tablet; Take 1 tablet (80 mg total) by mouth every evening.  Dispense: 90 tablet; Refill: 1  4. Stage 3a chronic kidney disease  (HCC)  - Microalbumin / creatinine urine ratio - COMPLETE METABOLIC PANEL WITH GFR - CBC with Differential/Platelet  5. Thoracic aortic atherosclerosis ([place])  On statin therapy   6. Hypertension associated with type 2 diabetes mellitus ([place])   7. Secondary hyperparathyroidism of renal origin ([place])  - Parathyroid hormone, intact (no Ca)  8. Hypertensive heart disease with heart failure ([place])   9. Pulmonary hypertension ([place])   10. Obstructive apnea   11. Morbid obesity ([place])  Discussed with the patient the risk posed by an increased BMI. Discussed importance of portion control, calorie counting and at least 150 minutes of physical activity weekly. Avoid sweet beverages and drink more water. Eat at least 6 servings of fruit and vegetables daily    12. Controlled gout  - allopurinol (ZYLOPRIM) 100 MG tablet; Take 1 tablet (100 mg total) by mouth 2 (two) times daily.  Dispense: 180 tablet; Refill: 1  13. Moderate persistent asthma without complication  - fluticasone-salmeterol (ADVAIR) 250-50 MCG/ACT AEPB; INHALE 1 PUFF BY MOUTH TWICE DAILY  Dispense: 180 each; Refill: 1  14. Benign essential hypertension  - irbesartan (AVAPRO) 300 MG tablet; Take 1 tablet (300 mg total) by mouth daily.  Dispense: 90 tablet; Refill: 1

## 2021-07-14 ENCOUNTER — Ambulatory Visit: Payer: Medicare PPO | Admitting: Family Medicine

## 2021-07-14 ENCOUNTER — Other Ambulatory Visit: Payer: Self-pay

## 2021-07-14 ENCOUNTER — Encounter: Payer: Self-pay | Admitting: Family Medicine

## 2021-07-14 VITALS — BP 138/68 | HR 93 | Temp 98.1°F | Resp 16 | Ht 70.0 in | Wt 354.0 lb

## 2021-07-14 DIAGNOSIS — N1831 Chronic kidney disease, stage 3a: Secondary | ICD-10-CM | POA: Diagnosis not present

## 2021-07-14 DIAGNOSIS — I10 Essential (primary) hypertension: Secondary | ICD-10-CM

## 2021-07-14 DIAGNOSIS — E1122 Type 2 diabetes mellitus with diabetic chronic kidney disease: Secondary | ICD-10-CM

## 2021-07-14 DIAGNOSIS — Z23 Encounter for immunization: Secondary | ICD-10-CM

## 2021-07-14 DIAGNOSIS — I152 Hypertension secondary to endocrine disorders: Secondary | ICD-10-CM

## 2021-07-14 DIAGNOSIS — M109 Gout, unspecified: Secondary | ICD-10-CM

## 2021-07-14 DIAGNOSIS — E1169 Type 2 diabetes mellitus with other specified complication: Secondary | ICD-10-CM | POA: Diagnosis not present

## 2021-07-14 DIAGNOSIS — E1159 Type 2 diabetes mellitus with other circulatory complications: Secondary | ICD-10-CM

## 2021-07-14 DIAGNOSIS — I110 Hypertensive heart disease with heart failure: Secondary | ICD-10-CM | POA: Diagnosis not present

## 2021-07-14 DIAGNOSIS — E785 Hyperlipidemia, unspecified: Secondary | ICD-10-CM | POA: Diagnosis not present

## 2021-07-14 DIAGNOSIS — N2581 Secondary hyperparathyroidism of renal origin: Secondary | ICD-10-CM

## 2021-07-14 DIAGNOSIS — G4733 Obstructive sleep apnea (adult) (pediatric): Secondary | ICD-10-CM

## 2021-07-14 DIAGNOSIS — N183 Chronic kidney disease, stage 3 unspecified: Secondary | ICD-10-CM | POA: Diagnosis not present

## 2021-07-14 DIAGNOSIS — I700 Atherosclerosis of aorta: Secondary | ICD-10-CM | POA: Diagnosis not present

## 2021-07-14 DIAGNOSIS — I272 Pulmonary hypertension, unspecified: Secondary | ICD-10-CM | POA: Diagnosis not present

## 2021-07-14 DIAGNOSIS — Z794 Long term (current) use of insulin: Secondary | ICD-10-CM

## 2021-07-14 DIAGNOSIS — J4540 Moderate persistent asthma, uncomplicated: Secondary | ICD-10-CM

## 2021-07-14 LAB — POCT GLYCOSYLATED HEMOGLOBIN (HGB A1C): Hemoglobin A1C: 7 % — AB (ref 4.0–5.6)

## 2021-07-14 MED ORDER — FLUTICASONE-SALMETEROL 250-50 MCG/ACT IN AEPB: INHALATION_SPRAY | RESPIRATORY_TRACT | 1 refills | Status: DC

## 2021-07-14 MED ORDER — POTASSIUM CHLORIDE CRYS ER 20 MEQ PO TBCR: 20.0000 meq | EXTENDED_RELEASE_TABLET | Freq: Every day | ORAL | 1 refills | Status: DC

## 2021-07-14 MED ORDER — ALLOPURINOL 100 MG PO TABS: 100.0000 mg | ORAL_TABLET | Freq: Two times a day (BID) | ORAL | 1 refills | Status: DC

## 2021-07-14 MED ORDER — PRAVASTATIN SODIUM 80 MG PO TABS: 80.0000 mg | ORAL_TABLET | Freq: Every evening | ORAL | 1 refills | Status: DC

## 2021-07-14 MED ORDER — IRBESARTAN 300 MG PO TABS: 300.0000 mg | ORAL_TABLET | Freq: Every day | ORAL | 1 refills | Status: DC

## 2021-07-15 LAB — COMPLETE METABOLIC PANEL WITH GFR
AG Ratio: 1.4 (calc) (ref 1.0–2.5)
ALT: 6 U/L (ref 6–29)
AST: 13 U/L (ref 10–35)
Albumin: 3.8 g/dL (ref 3.6–5.1)
Alkaline phosphatase (APISO): 80 U/L (ref 37–153)
BUN/Creatinine Ratio: 27 (calc) — ABNORMAL HIGH (ref 6–22)
BUN: 31 mg/dL — ABNORMAL HIGH (ref 7–25)
CO2: 29 mmol/L (ref 20–32)
Calcium: 8.9 mg/dL (ref 8.6–10.4)
Chloride: 102 mmol/L (ref 98–110)
Creat: 1.16 mg/dL — ABNORMAL HIGH (ref 0.60–1.00)
Globulin: 2.8 g/dL (calc) (ref 1.9–3.7)
Glucose, Bld: 155 mg/dL — ABNORMAL HIGH (ref 65–99)
Potassium: 4.8 mmol/L (ref 3.5–5.3)
Sodium: 138 mmol/L (ref 135–146)
Total Bilirubin: 0.5 mg/dL (ref 0.2–1.2)
Total Protein: 6.6 g/dL (ref 6.1–8.1)
eGFR: 50 mL/min/{1.73_m2} — ABNORMAL LOW (ref >=60)

## 2021-07-15 LAB — CBC WITH DIFFERENTIAL/PLATELET
Absolute Monocytes: 570 cells/uL (ref 200–950)
Basophils Absolute: 62 cells/uL (ref 0–200)
Basophils Relative: 0.7 %
Eosinophils Absolute: 187 cells/uL (ref 15–500)
Eosinophils Relative: 2.1 %
HCT: 33.4 % — ABNORMAL LOW (ref 35.0–45.0)
Hemoglobin: 10.8 g/dL — ABNORMAL LOW (ref 11.7–15.5)
Lymphs Abs: 1495 cells/uL (ref 850–3900)
MCH: 29.2 pg (ref 27.0–33.0)
MCHC: 32.3 g/dL (ref 32.0–36.0)
MCV: 90.3 fL (ref 80.0–100.0)
MPV: 10.9 fL (ref 7.5–12.5)
Monocytes Relative: 6.4 %
Neutro Abs: 6586 cells/uL (ref 1500–7800)
Neutrophils Relative %: 74 %
Platelets: 228 10*3/uL (ref 140–400)
RBC: 3.7 10*6/uL — ABNORMAL LOW (ref 3.80–5.10)
RDW: 14.2 % (ref 11.0–15.0)
Total Lymphocyte: 16.8 %
WBC: 8.9 10*3/uL (ref 3.8–10.8)

## 2021-07-15 LAB — LIPID PANEL
Cholesterol: 134 mg/dL (ref <200)
HDL: 51 mg/dL (ref >=50)
LDL Cholesterol (Calc): 67 mg/dL (calc)
Non-HDL Cholesterol (Calc): 83 mg/dL (calc) (ref <130)
Total CHOL/HDL Ratio: 2.6 (calc) (ref <5.0)
Triglycerides: 78 mg/dL (ref <150)

## 2021-07-15 LAB — MICROALBUMIN / CREATININE URINE RATIO
Creatinine, Urine: 57 mg/dL (ref 20–275)
Microalb Creat Ratio: 560 mcg/mg creat — ABNORMAL HIGH (ref <30)
Microalb, Ur: 31.9 mg/dL

## 2021-07-15 LAB — PARATHYROID HORMONE, INTACT (NO CA): PTH: 107 pg/mL — ABNORMAL HIGH (ref 16–77)

## 2021-08-19 DIAGNOSIS — H2513 Age-related nuclear cataract, bilateral: Secondary | ICD-10-CM | POA: Diagnosis not present

## 2021-08-19 LAB — HM DIABETES EYE EXAM

## 2021-08-27 ENCOUNTER — Encounter: Payer: Self-pay | Admitting: Ophthalmology

## 2021-08-27 ENCOUNTER — Other Ambulatory Visit: Payer: Self-pay

## 2021-08-31 ENCOUNTER — Other Ambulatory Visit: Payer: Self-pay | Admitting: *Deleted

## 2021-08-31 DIAGNOSIS — I4891 Unspecified atrial fibrillation: Secondary | ICD-10-CM

## 2021-08-31 MED ORDER — APIXABAN 5 MG PO TABS: 5.0000 mg | ORAL_TABLET | Freq: Two times a day (BID) | ORAL | 1 refills | Status: DC

## 2021-08-31 NOTE — Telephone Encounter (Signed)
Eliquis 5mg  paper refill request received. Patient is 76 years old, weight-160.6kg, Crea-1.16 on 07/14/2021, Diagnosis-Afib, and last seen by Dr. Fletcher Anon on 11/20/2020. Dose is appropriate based on dosing criteria. Will send in refill to requested pharmacy.

## 2021-09-01 ENCOUNTER — Telehealth: Payer: Self-pay

## 2021-09-01 NOTE — Progress Notes (Signed)
° ° °  Chronic Care Management Pharmacy Assistant   Name: Amber Reyes  MRN: [id number] DOB: 09-13-1945  Patient called to be reminded of her telephone appointment with [name], CPP on 09/02/2021 @ 0830  Patient aware of appointment date, time, and type of appointment (either telephone or in person). Patient aware to have/bring all medications, supplements, blood pressure and/or blood sugar logs to visit.  Questions: Are there any concerns you would like to discuss during your office visit? Nothing she can think of  If patient has any PAP medications ask if they are having any problems getting their PAP medication or refill? Patient is currently on Patient Assistance with Trulicity and Humalog 57/26, and her Renewal application for 2035 was faxed on 06/24/2021. I contacted [employer] regarding the patients Renewal Application status and spoke with the representative and was advised that the patient was enrolled for Novolog with no issues, but for her Trulicity there was no prescription sent with the 59/74 application and the prescription sent in October the provider did not date the prescription. So in order for the patient to get the patient to get her Trulicity prescription will need to be faxed ASAP and they will not take a verbal.   Star Rating Drug: Pravastatin 80 mg last filled on 06/12/2020 for a 90-Day supply with [facility] Irbesartan 300 mg last filled on 06/12/2020 for a 90-Day supply with [facility] Trulicity 1.5 mg patient on patient assistance through [employer]   Any gaps in medications fill history?  The Gaps could be incorrect since [employer] is now [place] Gaps: Zoster Vaccines [name] Diabetic Eye Exam    Amber Reyes, [place] Pharmacist Assistant Phone: [phone]

## 2021-09-02 ENCOUNTER — Ambulatory Visit (INDEPENDENT_AMBULATORY_CARE_PROVIDER_SITE_OTHER): Payer: Medicare PPO

## 2021-09-02 DIAGNOSIS — E1122 Type 2 diabetes mellitus with diabetic chronic kidney disease: Secondary | ICD-10-CM

## 2021-09-02 DIAGNOSIS — H2511 Age-related nuclear cataract, right eye: Secondary | ICD-10-CM | POA: Diagnosis not present

## 2021-09-02 DIAGNOSIS — N183 Chronic kidney disease, stage 3 unspecified: Secondary | ICD-10-CM

## 2021-09-02 DIAGNOSIS — N1831 Chronic kidney disease, stage 3a: Secondary | ICD-10-CM

## 2021-09-02 NOTE — Patient Instructions (Signed)
Visit Information It was great speaking with you today!  Please let me know if you have any questions about our visit.   Goals Addressed             This Visit's Progress    Monitor and Manage My Blood Sugar-Diabetes Type 2   On track    Timeframe:  Long-Range Goal Priority:  High Start Date:  10/29/2020                           Expected End Date:  05/01/2022                     Follow Up within 90 days   -check blood sugar three times daily: Before breakfast, before supper, and at bedtime - check blood sugar if I feel it is too high or too low - enter blood sugar readings and medication or insulin into daily log    Why is this important?   Checking your blood sugar at home helps to keep it from getting very high or very low.  Writing the results in a diary or log helps the doctor know how to care for you.  Your blood sugar log should have the time, date and the results.  Also, write down the amount of insulin or other medicine that you take.  Other information, like what you ate, exercise done and how you were feeling, will also be helpful.     Notes:      Track and Manage My Blood Pressure-Hypertension   On track    Timeframe:  Long-Range Goal Priority:  High Start Date:  12/17/2020                            Expected End Date:  06/18/2022                    Follow Up within 90 days   - check blood pressure 3 times per week    Why is this important?   You won't feel high blood pressure, but it can still hurt your blood vessels.  High blood pressure can cause heart or kidney problems. It can also cause a stroke.  Making lifestyle changes like losing a little weight or eating less salt will help.  Checking your blood pressure at home and at different times of the day can help to control blood pressure.  If the doctor prescribes medicine remember to take it the way the doctor ordered.  Call the office if you cannot afford the medicine or if there are questions about it.      Notes:         Patient Care Plan: General Pharmacy (Adult)     Problem Identified: Hypertension, Hyperlipidemia, Diabetes, Atrial Fibrillation, Asthma, Chronic Kidney Disease and Gout   Priority: High     Long-Range Goal: Patient-Specific Goal   Start Date: 10/29/2020  Expected End Date: 04/16/2022  This Visit's Progress: On track  Recent Progress: On track  Priority: High  Note:   Current Barriers:  Unable to independently afford treatment regimen Suboptimal therapeutic regimen for diabetes  Pharmacist Clinical Goal(s):  Over the next 90 days, patient will maintain control of diabetes as evidenced by A1c less than 8%  through collaboration with PharmD and provider.   Interventions: 1:1 collaboration with Steele Sizer, MD regarding development and update of comprehensive plan of  care as evidenced by provider attestation and co-signature Inter-disciplinary care team collaboration (see longitudinal plan of care) Comprehensive medication review performed; medication list updated in electronic medical record  Hypertension (BP goal <140/90) -Uncontrolled -Current treatment: Diltiazem CD 240 mg daily: Appropriate, Effective, Safe, Accessible  Furosemide 40 mg twice daily as needed: Appropriate, Effective, Query Safe, Accessible Irbersartan 300 mg daily: Appropriate, Effective, Safe, Accessible  -Medications previously tried: NA  -Current home readings (Prior to medication):    150/59; Pulse 55. 159/62; pulse 70. 150/66; pulse 62   177/67; 177/62    153/55; 146/59  -Blood pressure machine has not been calibrated recently, patient reports it is 42+ years old. Reviewed monitoring technique; patient to bring into  -Current dietary habits: She has been eating less, trying to follow a low-carb, low-sodium diet.   -Query safe furosemide usage: Patient reports cramping on days when she takes the medication. -Current exercise habits: Unable to walk long distances.  -Denies  hypotensive/hypertensive symptoms -Recommended to continue current medication  Atrial Fibrillation (Goal: prevent stroke and major bleeding) -Controlled -CHADSVASC: 5 -Current treatment: Rate control: Diltiazem CD 240 mg daily  Anticoagulation: Eliquis 5 mg twice daily  -Medications previously tried: NA -Home BP and HR readings: NA  -Counseled on avoidance of NSAIDs due to increased bleeding risk with anticoagulants; -Recommended to continue current medication  Hyperlipidemia: (LDL goal < 70) -Controlled -Current treatment: Pravastatin 80 mg daily  -Medications previously tried: NA  -Educated on Importance of limiting foods high in cholesterol; -Recommended to continue current medication  Diabetes (A1c goal <8%) -Controlled -Current medications: Trulicity 1.5 mg weekly: Appropriate, Effective, Safe, Accessible   Humalog 75-25 10 units before breakfast, 10 units before supper: Appropriate, Effective, Safe, Accessible  -Medications previously tried: Metformin  -Current home glucose readings:   Fasting: 99, 101, 105, 116, 112, 130, 110, 122, 111, 118, 124  -Denies  hypoglycemic symptoms -Patient at severe risk of worsening CKD due to macroalbuminuria. Discussed starting SGLT2- inhibitor with patient, she wanted to discuss with nephrology at her next follow-up. Asked patient ot inform me if she was amenable to starting the medication so we can apply for PAP.  -Continue current medications  Asthma (Goal: control symptoms and prevent exacerbations) -Controlled -Current treatment  Proventil HFA 2 puffs every 6 hours as needed Advair 1 puff twice daily  -Medications previously tried: NA  -Exacerbations requiring treatment in last 6 months: None -Patient reports consistent use of maintenance inhaler -Frequency of rescue inhaler use: NA -Counseled on When to use rescue inhaler -Recommended to continue current medication  Chronic Kidney Disease Stage 3a  -All medications assessed  for renal dosing and appropriateness in chronic kidney disease. -Recommended to continue current medication  Patient Goals/Self-Care Activities Over the next 90 days, patient will:  - check glucose 3 times daily, before breakfast, supper, and bedtime, document, and provide at future appointments check blood pressure 2-3 times weekly, document, and provide at future appointments  Follow Up Plan: Telephone follow up appointment with care management team member scheduled for:  12/02/2021 at 9:15 AM    Patient agreed to services and verbal consent obtained.   Patient verbalizes understanding of instructions provided today and agrees to view in [place].   Malva Limes, [place] Pharmacist Practitioner  [facility] [phone]

## 2021-09-02 NOTE — Progress Notes (Signed)
Chronic Care Management Pharmacy Note  09/02/2021 Name:  Amber Reyes MRN:  [id number] DOB:  Dec 11, 1945  Summary: Patient presents for CCM follow-up. Home blood pressures have been elevated. Patient is candidate for SGLT2-inhibitor given CKD, macroalbuminuria.   Recommendations/Changes made from today's visit: -Recommend Farxiga 10 mg daily at next follow-up for CKD / heart failure protection -Recommend decreasing Insulin to 5 units twice daily    Plan: CPP follow-up in 3 months  Subjective: Amber Reyes is an 76 y.o. year old female who is a primary patient of Steele Sizer, MD.  The CCM team was consulted for assistance with disease management and care coordination needs.    Engaged with patient by telephone for follow up visit in response to provider referral for pharmacy case management and/or care coordination services.   Consent to Services:  The patient was given information about Chronic Care Management services, agreed to services, and gave verbal consent prior to initiation of services.  Please see initial visit note for detailed documentation.   Patient Care Team: Steele Sizer, MD as PCP - General (Family Medicine) Wellington Hampshire, MD as Consulting Physician (Cardiology) Germaine Pomfret, [facility] as Pharmacist (Pharmacist)  Recent office visits: 03/09/21: Patient presented to Dr. Ancil Boozer for follow-up. A1c 7.0%. 11/19/20: Patient presented to Dr. Ancil Boozer for follow-up. A1c 6.8%. BP 144/70. Humalog 75/25 10 units twice daily  06/19/20: Patient presented to Clemetine Marker, LPN for AWV.   Recent consult visits: 04/21/21: Dr. Fletcher Anon decreased Diltiazem to 240 mg daily due to bradycardia.  11/20/20: Patient presented to Dr. Fletcher Anon (Cardiology) for follow-up. BP 150/58. No medication changes made.  11/12/20: Patient presented to Sherlynn Stalls, ANP (Nephrology) for follow-up. BP 175/73.   Hospital visits: None in previous 6 months  Objective:  Lab Results   Component Value Date   CREATININE 1.16 (H) 07/14/2021   BUN 31 (H) 07/14/2021   GFRNONAA 41 (L) 05/12/2020   GFRAA 48 (L) 05/12/2020   NA 138 07/14/2021   K 4.8 07/14/2021   CALCIUM 8.9 07/14/2021   CO2 29 07/14/2021    Lab Results  Component Value Date/Time   HGBA1C 7.0 (A) 07/14/2021 08:18 AM   HGBA1C 7.0 (A) 03/09/2021 08:03 AM   HGBA1C 6.8 (H) 05/14/2019 12:00 AM   HGBA1C 6.5 01/01/2019 07:48 AM   HGBA1C 6.6 08/30/2018 07:46 AM   HGBA1C 6.6 (A) 08/30/2018 07:46 AM   HGBA1C 6.6 08/30/2018 07:46 AM   MICROALBUR 31.9 07/14/2021 09:01 AM   MICROALBUR 5.4 05/14/2019 12:00 AM   MICROALBUR 20 08/15/2015 08:12 AM    Last diabetic Eye exam:  Lab Results  Component Value Date/Time   HMDIABEYEEXA No Retinopathy 08/18/2020 12:00 AM    Last diabetic Foot exam: No results found for: HMDIABFOOTEX   Lab Results  Component Value Date   CHOL 134 07/14/2021   HDL 51 07/14/2021   LDLCALC 67 07/14/2021   TRIG 78 07/14/2021   CHOLHDL 2.6 07/14/2021    Hepatic Function Latest Ref Rng & Units 07/14/2021 05/12/2020 05/14/2019  Total Protein 6.1 - 8.1 g/dL 6.6 6.8 6.6  Albumin 3.6 - 5.1 g/dL - - -  AST 10 - 35 U/L _0 ALT 6 - 29 U/L 6 8 5(L)  Alk Phosphatase 33 - 130 U/L - - -  Total Bilirubin 0.2 - 1.2 mg/dL 0.5 0.5 0.5  Bilirubin, Direct 0.0 - 0.2 mg/dL - - -    No results found for: TSH, FREET4  CBC Latest Ref Rng &  Units 07/14/2021 11/19/2020 05/12/2020  WBC 3.8 - 10.8 Thousand/uL 8.9 7.6 8.6  Hemoglobin 11.7 - 15.5 g/dL 10.8(L) 10.2(L) 10.3(L)  Hematocrit 35.0 - 45.0 % 33.4(L) 31.9(L) 31.7(L)  Platelets 140 - 400 Thousand/uL 228 225 229    Lab Results  Component Value Date/Time   VD25OH 45 05/12/2020 08:59 AM   VD25OH 48 05/14/2019 12:00 AM    Clinical ASCVD: Yes  The 10-year ASCVD risk score (Arnett DK, et al., 2019) is: 24%   Values used to calculate the score:     Age: 76 years     Sex: Female     Is Non-Hispanic African American: Yes     Diabetic: Yes      Tobacco smoker: No     Systolic Blood Pressure: 510 mmHg     Is BP treated: Yes     HDL Cholesterol: 51 mg/dL     Total Cholesterol: 134 mg/dL    Depression screen [facility] 2/9 07/14/2021 06/23/2021 03/09/2021  Decreased Interest 0 0 0  Down, Depressed, Hopeless 0 0 0  PHQ - 2 Score 0 0 0  Altered sleeping 0 - -  Tired, decreased energy 0 - -  Change in appetite 0 - -  Feeling bad or failure about yourself  0 - -  Trouble concentrating 0 - -  Moving slowly or fidgety/restless 0 - -  Suicidal thoughts 0 - -  PHQ-9 Score 0 - -  Difficult doing work/chores - - -  Some recent data might be hidden    Social History   Tobacco Use  Smoking Status Never  Smokeless Tobacco Never  Tobacco Comments   n/a   BP Readings from Last 3 Encounters:  07/14/21 138/68  03/09/21 (!) 142/68  11/20/20 (!) 150/58   Pulse Readings from Last 3 Encounters:  07/14/21 93  03/09/21 73  11/20/20 71   Wt Readings from Last 3 Encounters:  07/14/21 (!) 354 lb (160.6 kg)  03/09/21 (!) 365 lb (165.6 kg)  11/20/20 (!) 375 lb (170.1 kg)    Assessment/Interventions: Review of patient past medical history, allergies, medications, health status, including review of consultants reports, laboratory and other test data, was performed as part of comprehensive evaluation and provision of chronic care management services.   SDOH:  (Social Determinants of Health) assessments and interventions performed: Yes     CCM Care Plan  No Known Allergies  Medications Reviewed Today     Reviewed by Ernie Avena, RN (Registered Nurse) on 08/27/21 at 1135  Med List Status: <None>   Medication Order Taking? Sig Documenting Provider Last Dose Status Informant  acetaminophen (TYLENOL) 650 MG CR tablet [id number]  Take 650 mg by mouth in the morning and at bedtime. [provider]  Active   albuterol (PROVENTIL HFA;VENTOLIN HFA) 108 (90 BASE) MCG/ACT inhaler [id number]  Inhale 2 puffs into the lungs every 6 (six)  hours as needed for wheezing or shortness of breath. [provider]  Active Self  allopurinol (ZYLOPRIM) 100 MG tablet [id number]  Take 1 tablet (100 mg total) by mouth 2 (two) times daily. Steele Sizer, MD  Active   Blood Glucose Calibration (TRUE METRIX LEVEL 1) Low SOLN [id number]  1 each by In Vitro route once a week. Steele Sizer, MD  Active   Blood Glucose Monitoring Suppl (TRUE METRIX AIR GLUCOSE METER) w/Device KIT [id number]  Inject 1 each into the skin 4 (four) times daily as needed. Check fsbs 4 times daily E11.22 Steele Sizer, MD  Active   Cholecalciferol (VITAMIN D) 2000 UNITS tablet [id number]  Take 2,000 Units by mouth daily. [provider]  Active Self  colchicine (COLCRYS) 0.6 MG tablet [id number]  Take 1-2 tablets (0.6-1.2 mg total) by mouth daily as needed. Steele Sizer, MD  Active   diltiazem (CARDIZEM CD) 240 MG 24 hr capsule [id number]  Take 1 capsule (240 mg total) by mouth daily. Wellington Hampshire, MD  Active   DROPLET PEN NEEDLES 30G X 8 MM MISC [id number]  USE TWICE DAILY WITH INSULIN Ancil Boozer, Drue Stager, MD  Active   Dulaglutide (TRULICITY) 1.5 LV/7.4XV SOPN [id number]  Inject 1.5 mg into the skin once a week. Steele Sizer, MD  Active   Elastic Bandages & Supports ([place]) [place] [id number]  2 each by Does not apply route daily. Steele Sizer, MD  Active Self  ELIQUIS 5 MG TABS tablet [id number]  TAKE 1 TABLET TWICE DAILY Wellington Hampshire, MD  Active   fluticasone (FLONASE) 50 MCG/ACT nasal spray [id number]  Place 2 sprays into both nostrils daily. Steele Sizer, MD  Active   fluticasone-salmeterol (ADVAIR) 250-50 MCG/ACT AEPB [id number]  INHALE 1 PUFF BY MOUTH TWICE DAILY Ancil Boozer, Drue Stager, MD  Active   furosemide (LASIX) 40 MG tablet [id number]  TAKE 1 TABLET (40 MG TOTAL) BY MOUTH 2 (TWO) TIMES DAILY AS NEEDED. Steele Sizer, MD  Active   glucose blood (TRUE METRIX BLOOD GLUCOSE TEST) test strip [id number]  CHECK BLOOD SUGAR FOUR  TIMES DAILY Steele Sizer, MD  Active   Insulin Lispro Prot & Lispro (HUMALOG MIX 75/25 KWIKPEN) (75-25) 100 UNIT/ML Claiborne Rigg [id number]  Inject 10 Units into the skin 2 (two) times daily with a meal. Steele Sizer, MD  Active   irbesartan (AVAPRO) 300 MG tablet [id number]  Take 1 tablet (300 mg total) by mouth daily. Steele Sizer, MD  Active   potassium chloride SA (KLOR-CON) 20 MEQ tablet [id number] No Take 1 tablet (20 mEq total) by mouth daily. With furosemide  Patient not taking: Reported on 08/27/2021   Steele Sizer, MD Not Taking Active   pravastatin (PRAVACHOL) 80 MG tablet [id number]  Take 1 tablet (80 mg total) by mouth every evening. Steele Sizer, MD  Active   TRUEplus Lancets 33G [place] [id number]  1 each by Does not apply route 3 (three) times daily as needed. Steele Sizer, MD  Active             Patient Active Problem List   Diagnosis Date Noted   Localized, 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/22/2021   Pain in joint of left shoulder 04/22/2021   Tendonitis of shoulder, left 04/22/2021   Hypertension associated with stage 3 chronic kidney disease due to type 2 diabetes mellitus ([place]) 02/06/2020   History of colonic polyps    Polyp of colon    Thoracic aortic atherosclerosis ([place]) 12/30/2016   Controlled type 2 diabetes mellitus with stage 3 chronic kidney disease ([place]) 12/23/2016   Venous insufficiency 05/21/2016   Hypertensive heart disease 05/21/2016   Anemia in chronic kidney disease 12/22/2015   Pulmonary hypertension ([place]) 12/18/2015   Xanthelasma 04/15/2015   Anxiety 04/12/2015   Chronic kidney disease (CKD), stage III (moderate) (HCC) 04/12/2015   Edema extremities 04/12/2015   Disc disorder of lumbar region 04/12/2015   Asthma, moderate persistent 04/12/2015   Morbid obesity, unspecified obesity type ([place]) 04/12/2015   Obstructive apnea 04/12/2015   Restless leg 04/12/2015   Allergic rhinitis 04/12/2015   Vitamin D deficiency 04/12/2015  Controlled gout 04/12/2015   Premature atrial contractions 11/29/2013   Atrial fibrillation ([place]) 11/09/2013   Benign essential hypertension    Hyperlipidemia     Immunization History  Administered Date(s) Administered   Fluad Quad(high Dose 65+) 05/14/2019, 05/12/2020, 07/14/2021   Influenza Split 05/16/2007   Influenza, High Dose Seasonal PF 06/24/2017, 04/25/2018   Influenza, Seasonal, Injecte, Preservative Fre 05/07/2010, 04/27/2011   Influenza,inj,Quad PF,6+ Mos 08/02/2014, 04/15/2015, 05/22/2016   Influenza-Unspecified 08/02/2014   Moderna Sars-Covid-2 Vaccination 09/28/2019, 10/26/2019, 07/02/2020   Pneumococcal Conjugate-13 08/02/2014   Pneumococcal Polysaccharide-23 04/27/2011, 06/04/2016   Tdap 09/29/2012   Zoster, Live 09/29/2012    Conditions to be addressed/monitored:  Hypertension, Hyperlipidemia, Diabetes, Atrial Fibrillation, Asthma, Chronic Kidney Disease and Gout  Care Plan : General Pharmacy (Adult)  Updates made by Germaine Pomfret, RPH since 09/02/2021 12:00 AM     Problem: Hypertension, Hyperlipidemia, Diabetes, Atrial Fibrillation, Asthma, Chronic Kidney Disease and Gout   Priority: High     Long-Range Goal: Patient-Specific Goal   Start Date: 10/29/2020  Expected End Date: 04/16/2022  This Visit's Progress: On track  Recent Progress: On track  Priority: High  Note:   Current Barriers:  Unable to independently afford treatment regimen Suboptimal therapeutic regimen for diabetes  Pharmacist Clinical Goal(s):  Over the next 90 days, patient will maintain control of diabetes as evidenced by A1c less than 8%  through collaboration with PharmD and provider.   Interventions: 1:1 collaboration with Steele Sizer, MD regarding development and update of comprehensive plan of care as evidenced by provider attestation and co-signature Inter-disciplinary care team collaboration (see longitudinal plan of care) Comprehensive medication review performed;  medication list updated in electronic medical record  Hypertension (BP goal <140/90) -Uncontrolled -Current treatment: Diltiazem CD 240 mg daily: Appropriate, Effective, Safe, Accessible  Furosemide 40 mg twice daily as needed: Appropriate, Effective, Query Safe, Accessible Irbersartan 300 mg daily: Appropriate, Effective, Safe, Accessible  -Medications previously tried: NA  -Current home readings (Prior to medication):    150/59; Pulse 55. 159/62; pulse 70. 150/66; pulse 62   177/67; 177/62    153/55; 146/59  -Blood pressure machine has not been calibrated recently, patient reports it is 59+ years old. Reviewed monitoring technique; patient to bring into  -Current dietary habits: She has been eating less, trying to follow a low-carb, low-sodium diet.   -Query safe furosemide usage: Patient reports cramping on days when she takes the medication. -Current exercise habits: Unable to walk long distances.  -Denies hypotensive/hypertensive symptoms -Recommended to continue current medication  Atrial Fibrillation (Goal: prevent stroke and major bleeding) -Controlled -CHADSVASC: 5 -Current treatment: Rate control: Diltiazem CD 240 mg daily  Anticoagulation: Eliquis 5 mg twice daily  -Medications previously tried: NA -Home BP and HR readings: NA  -Counseled on avoidance of NSAIDs due to increased bleeding risk with anticoagulants; -Recommended to continue current medication  Hyperlipidemia: (LDL goal < 70) -Controlled -Current treatment: Pravastatin 80 mg daily  -Medications previously tried: NA  -Educated on Importance of limiting foods high in cholesterol; -Recommended to continue current medication  Diabetes (A1c goal <8%) -Controlled -Current medications: Trulicity 1.5 mg weekly: Appropriate, Effective, Safe, Accessible   Humalog 75-25 10 units before breakfast, 10 units before supper: Appropriate, Effective, Safe, Accessible  -Medications previously tried: Metformin  -Current  home glucose readings:   Fasting: 99, 101, 105, 116, 112, 130, 110, 122, 111, 118, 124  -Denies  hypoglycemic symptoms -Patient at severe risk of worsening CKD due to macroalbuminuria. Discussed starting SGLT2- inhibitor with  patient, she wanted to discuss with nephrology at her next follow-up. Asked patient ot inform me if she was amenable to starting the medication so we can apply for PAP.  -Continue current medications  Asthma (Goal: control symptoms and prevent exacerbations) -Controlled -Current treatment  Proventil HFA 2 puffs every 6 hours as needed Advair 1 puff twice daily  -Medications previously tried: NA  -Exacerbations requiring treatment in last 6 months: None -Patient reports consistent use of maintenance inhaler -Frequency of rescue inhaler use: NA -Counseled on When to use rescue inhaler -Recommended to continue current medication  Chronic Kidney Disease Stage 3a  -All medications assessed for renal dosing and appropriateness in chronic kidney disease. -Recommended to continue current medication  Patient Goals/Self-Care Activities Over the next 90 days, patient will:  - check glucose 3 times daily, before breakfast, supper, and bedtime, document, and provide at future appointments check blood pressure 2-3 times weekly, document, and provide at future appointments  Follow Up Plan: Telephone follow up appointment with care management team member scheduled for:  12/02/2021 at 9:15 AM      Medication Assistance:  Trulicity, Humalog obtained through [employer] medication assistance program.  Enrollment ends Dec 2022 Eliquis patient assistance is still pending. Patient denied until able to show out of pocket spending report.   Patient's preferred pharmacy is:  [facility], [place] [address] Phone: [phone] Fax: [phone]  [facility] [address] [place] - [place] [address] [place] [address] Phone: [phone] Fax: [phone]  [facility] [place], [place] - [address] [place] [address] Phone: [phone] Fax: [phone]   Uses pill box? Yes Pt endorses 100% compliance  We discussed: Current pharmacy is preferred with insurance plan and patient is satisfied with pharmacy services Patient decided to: Continue current medication management strategy  Care Plan and Follow Up Patient Decision:  Patient agrees to Care Plan and Follow-up.  Plan: Telephone follow up appointment with care management team member scheduled for:  12/02/2021 at [place], [place], [place] Pharmacist Practitioner  [facility] [phone]

## 2021-09-10 ENCOUNTER — Telehealth: Payer: Self-pay

## 2021-09-10 NOTE — Discharge Instructions (Signed)

## 2021-09-10 NOTE — Progress Notes (Signed)
° ° °  Chronic Care Management Pharmacy Assistant   Name: Amber Reyes  MRN: [id number] DOB: [birth year]  Patient Assistance Renewal Application Status  Patient's 4098 renewal application has not been processed for her Trulicity as there was an issue with her prescription. CPP was notified and the prescription was faxed again to [employer] 11/91/4782. I reached out to [employer], and spoke with Angelic who advised that they still have not received the fax. She stated that there were still some faxes in que from the 11 th so she stated she would place me on a brief hold to take a look at those faxes.  Angelic did advise once she went through the faxes she was not able to find the patient's prescription. Message sent to CPP requesting that he refax the prescription to them ASAP.  Medications: Outpatient Encounter Medications as of 09/10/2021  Medication Sig   acetaminophen (TYLENOL) 650 MG CR tablet Take 650 mg by mouth in the morning and at bedtime.   albuterol (PROVENTIL HFA;VENTOLIN HFA) 108 (90 BASE) MCG/ACT inhaler Inhale 2 puffs into the lungs every 6 (six) hours as needed for wheezing or shortness of breath.   allopurinol (ZYLOPRIM) 100 MG tablet Take 1 tablet (100 mg total) by mouth 2 (two) times daily.   apixaban (ELIQUIS) 5 MG TABS tablet Take 1 tablet (5 mg total) by mouth 2 (two) times daily.   Blood Glucose Calibration (TRUE METRIX LEVEL 1) Low SOLN 1 each by In Vitro route once a week.   Blood Glucose Monitoring Suppl (TRUE METRIX AIR GLUCOSE METER) w/Device KIT Inject 1 each into the skin 4 (four) times daily as needed. Check fsbs 4 times daily E11.22   Cholecalciferol (VITAMIN D) 2000 UNITS tablet Take 2,000 Units by mouth daily.   colchicine (COLCRYS) 0.6 MG tablet Take 1-2 tablets (0.6-1.2 mg total) by mouth daily as needed.   diltiazem (CARDIZEM CD) 240 MG 24 hr capsule Take 1 capsule (240 mg total) by mouth daily.   DROPLET PEN NEEDLES 30G X 8 MM MISC USE TWICE DAILY WITH  INSULIN   Dulaglutide (TRULICITY) 1.5 NF/6.2ZH SOPN Inject 1.5 mg into the skin once a week.   Elastic Bandages & Supports (MEDICAL COMPRESSION STOCKINGS) MISC 2 each by Does not apply route daily.   fluticasone (FLONASE) 50 MCG/ACT nasal spray Place 2 sprays into both nostrils daily.   fluticasone-salmeterol (ADVAIR) 250-50 MCG/ACT AEPB INHALE 1 PUFF BY MOUTH TWICE DAILY   furosemide (LASIX) 40 MG tablet TAKE 1 TABLET (40 MG TOTAL) BY MOUTH 2 (TWO) TIMES DAILY AS NEEDED.   glucose blood (TRUE METRIX BLOOD GLUCOSE TEST) test strip CHECK BLOOD SUGAR FOUR TIMES DAILY   Insulin Lispro Prot & Lispro (HUMALOG MIX 75/25 KWIKPEN) (75-25) 100 UNIT/ML Kwikpen Inject 10 Units into the skin 2 (two) times daily with a meal.   irbesartan (AVAPRO) 300 MG tablet Take 1 tablet (300 mg total) by mouth daily.   potassium chloride SA (KLOR-CON) 20 MEQ tablet Take 1 tablet (20 mEq total) by mouth daily. With furosemide (Patient not taking: Reported on 08/27/2021)   pravastatin (PRAVACHOL) 80 MG tablet Take 1 tablet (80 mg total) by mouth every evening.   TRUEplus Lancets 33G MISC 1 each by Does not apply route 3 (three) times daily as needed.   No facility-administered encounter medications on file as of 09/10/2021.    [name], CPA/CMA Clinical Pharmacist Assistant Phone: [phone]

## 2021-09-11 ENCOUNTER — Other Ambulatory Visit: Payer: Self-pay

## 2021-09-11 ENCOUNTER — Telehealth: Payer: Self-pay | Admitting: Cardiovascular Disease

## 2021-09-11 DIAGNOSIS — I4891 Unspecified atrial fibrillation: Secondary | ICD-10-CM

## 2021-09-11 MED ORDER — APIXABAN 5 MG PO TABS: 5.0000 mg | ORAL_TABLET | Freq: Two times a day (BID) | ORAL | 0 refills | Status: DC

## 2021-09-11 MED ORDER — APIXABAN 5 MG PO TABS: 5.0000 mg | ORAL_TABLET | Freq: Two times a day (BID) | ORAL | 5 refills | Status: DC

## 2021-09-11 NOTE — Telephone Encounter (Signed)
° ° °*  STAT* If patient is at the pharmacy, call can be transferred to refill team.   1. Which medications need to be refilled? (please list name of each medication and dose if known) apixaban (ELIQUIS) 5 MG TABS tablet  2. Which pharmacy/location (including street and city if local pharmacy) is medication to be sent to? [place], [place]  3. Do they need a 30 day or 90 day supply? 30 days  Pt is needs 30 days supply while waiting for her refill sent from [facility]

## 2021-09-11 NOTE — Telephone Encounter (Signed)
Patient still needing 30 day supply of Eliquis sent to [facility] on [address] while awaiting mail order Patient has been out for 2 days

## 2021-09-11 NOTE — Telephone Encounter (Signed)
Prescription refill request for Eliquis received. Indication:Afib Last office visit:3/22 Scr:1.1 Age: 76 Weight:160.6  kg  Prescription refilled

## 2021-09-15 ENCOUNTER — Encounter
Admission: RE | Disposition: A | Discharge status: Home / Self Care | Payer: Self-pay | Source: Home / Self Care | Attending: Ophthalmology

## 2021-09-15 ENCOUNTER — Other Ambulatory Visit: Payer: Self-pay

## 2021-09-15 ENCOUNTER — Ambulatory Visit: Payer: Medicare PPO | Admitting: Anesthesiology

## 2021-09-15 ENCOUNTER — Encounter: Payer: Self-pay | Admitting: Ophthalmology

## 2021-09-15 ENCOUNTER — Ambulatory Visit
Admission: RE | Admit: 2021-09-15 | Discharge: 2021-09-15 | Disposition: A | Discharge status: Home / Self Care | Payer: Medicare PPO | Attending: Ophthalmology | Admitting: Ophthalmology

## 2021-09-15 DIAGNOSIS — E1136 Type 2 diabetes mellitus with diabetic cataract: Secondary | ICD-10-CM | POA: Insufficient documentation

## 2021-09-15 DIAGNOSIS — Z6841 Body Mass Index (BMI) 40.0 and over, adult: Secondary | ICD-10-CM | POA: Diagnosis not present

## 2021-09-15 DIAGNOSIS — H2511 Age-related nuclear cataract, right eye: Secondary | ICD-10-CM | POA: Insufficient documentation

## 2021-09-15 DIAGNOSIS — F419 Anxiety disorder, unspecified: Secondary | ICD-10-CM | POA: Insufficient documentation

## 2021-09-15 DIAGNOSIS — N189 Chronic kidney disease, unspecified: Secondary | ICD-10-CM | POA: Insufficient documentation

## 2021-09-15 DIAGNOSIS — I129 Hypertensive chronic kidney disease with stage 1 through stage 4 chronic kidney disease, or unspecified chronic kidney disease: Secondary | ICD-10-CM | POA: Diagnosis not present

## 2021-09-15 DIAGNOSIS — G4730 Sleep apnea, unspecified: Secondary | ICD-10-CM | POA: Insufficient documentation

## 2021-09-15 DIAGNOSIS — E785 Hyperlipidemia, unspecified: Secondary | ICD-10-CM | POA: Insufficient documentation

## 2021-09-15 DIAGNOSIS — J45909 Unspecified asthma, uncomplicated: Secondary | ICD-10-CM | POA: Insufficient documentation

## 2021-09-15 DIAGNOSIS — H25811 Combined forms of age-related cataract, right eye: Secondary | ICD-10-CM | POA: Diagnosis not present

## 2021-09-15 HISTORY — PX: CATARACT EXTRACTION W/PHACO: SHX586

## 2021-09-15 LAB — GLUCOSE, CAPILLARY: Glucose-Capillary: 162 mg/dL — ABNORMAL HIGH (ref 70–99)

## 2021-09-15 SURGERY — PHACOEMULSIFICATION, CATARACT, WITH IOL INSERTION
Anesthesia: Monitor Anesthesia Care | Site: Eye | Laterality: Right

## 2021-09-15 MED ORDER — SIGHTPATH DOSE#1 NA CHONDROIT SULF-NA HYALURON 40-17 MG/ML IO SOLN
INTRAOCULAR | Status: DC | PRN
Start: 2021-09-15 — End: 2021-09-15
  Administered 2021-09-15: 1 mL via INTRAOCULAR

## 2021-09-15 MED ORDER — BRIMONIDINE TARTRATE-TIMOLOL 0.2-0.5 % OP SOLN
OPHTHALMIC | Status: DC | PRN
  Administered 2021-09-15: 1 [drp] via OPHTHALMIC

## 2021-09-15 MED ORDER — ARMC OPHTHALMIC DILATING DROPS
1.0000 "application " | OPHTHALMIC | Status: DC | PRN
  Administered 2021-09-15 (×3): 1 via OPHTHALMIC

## 2021-09-15 MED ORDER — MIDAZOLAM HCL 2 MG/2ML IJ SOLN
INTRAMUSCULAR | Status: DC | PRN
  Administered 2021-09-15 (×2): 1 mg via INTRAVENOUS

## 2021-09-15 MED ORDER — TETRACAINE HCL 0.5 % OP SOLN
1.0000 [drp] | OPHTHALMIC | Status: DC | PRN
  Administered 2021-09-15 (×3): 1 [drp] via OPHTHALMIC

## 2021-09-15 MED ORDER — MOXIFLOXACIN HCL 0.5 % OP SOLN
OPHTHALMIC | Status: DC | PRN
  Administered 2021-09-15: 0.2 mL via OPHTHALMIC

## 2021-09-15 MED ORDER — SIGHTPATH DOSE#1 BSS IO SOLN
INTRAOCULAR | Status: DC | PRN
  Administered 2021-09-15: 1 mL via INTRAMUSCULAR

## 2021-09-15 MED ORDER — FENTANYL CITRATE (PF) 100 MCG/2ML IJ SOLN
INTRAMUSCULAR | Status: DC | PRN
  Administered 2021-09-15: 50 ug via INTRAVENOUS

## 2021-09-15 MED ORDER — SIGHTPATH DOSE#1 BSS IO SOLN
INTRAOCULAR | Status: DC | PRN
  Administered 2021-09-15: 11:00:00 55 mL via OPHTHALMIC

## 2021-09-15 MED ORDER — SIGHTPATH DOSE#1 BSS IO SOLN
INTRAOCULAR | Status: DC | PRN
  Administered 2021-09-15: 15 mL

## 2021-09-15 SURGICAL SUPPLY — 9 items
CATARACT SUITE SIGHTPATH (MISCELLANEOUS) ×3 IMPLANT
FEE CATARACT SUITE SIGHTPATH (MISCELLANEOUS) ×1 IMPLANT
GLOVE SURG ENC TEXT LTX SZ8 (GLOVE) ×3 IMPLANT
GLOVE SURG TRIUMPH 8.0 PF LTX (GLOVE) ×3 IMPLANT
LENS IOL TECNIS EYHANCE 20.0 (Intraocular Lens) ×2 IMPLANT
NDL FILTER BLUNT 18X1 1/2 (NEEDLE) ×1 IMPLANT
NEEDLE FILTER BLUNT 18X 1/2SAF (NEEDLE) ×2
NEEDLE FILTER BLUNT 18X1 1/2 (NEEDLE) ×1 IMPLANT
SYR 3ML LL SCALE MARK (SYRINGE) ×3 IMPLANT

## 2021-09-15 NOTE — Anesthesia Postprocedure Evaluation (Signed)
Anesthesia Post Note  Patient: Amber Reyes  Procedure(s) Performed: CATARACT EXTRACTION PHACO AND INTRAOCULAR LENS PLACEMENT (IOC) RIGHT 10.19 01:01.8 (Right: Eye)     Patient location during evaluation: PACU Anesthesia Type: MAC Level of consciousness: awake Pain management: pain level controlled Vital Signs Assessment: post-procedure vital signs reviewed and stable Respiratory status: respiratory function stable Cardiovascular status: stable Postop Assessment: no apparent nausea or vomiting Anesthetic complications: no   No notable events documented.  Veda Canning

## 2021-09-15 NOTE — Anesthesia Preprocedure Evaluation (Signed)
Anesthesia Evaluation  Patient identified by MRN, date of birth, ID band Patient awake    Reviewed: Allergy & Precautions, NPO status   Airway Mallampati: III  TM Distance: >3 FB     Dental   Pulmonary asthma , sleep apnea ,    Pulmonary exam normal        Cardiovascular hypertension,  Rhythm:Regular Rate:Normal  HLD   Neuro/Psych Anxiety RLS    GI/Hepatic   Endo/Other  diabetesMorbid obesity  Renal/GU Renal InsufficiencyRenal disease     Musculoskeletal  (+) Arthritis ,   Abdominal   Peds  Hematology   Anesthesia Other Findings   Reproductive/Obstetrics                             Anesthesia Physical Anesthesia Plan  ASA: 3  Anesthesia Plan: MAC   Post-op Pain Management: Minimal or no pain anticipated   Induction: Intravenous  PONV Risk Score and Plan: TIVA, Midazolam and Treatment may vary due to age or medical condition  Airway Management Planned: Natural Airway and Nasal Cannula  Additional Equipment:   Intra-op Plan:   Post-operative Plan:   Informed Consent: I have reviewed the patients History and Physical, chart, labs and discussed the procedure including the risks, benefits and alternatives for the proposed anesthesia with the patient or authorized representative who has indicated his/her understanding and acceptance.       Plan Discussed with: CRNA  Anesthesia Plan Comments:         Anesthesia Quick Evaluation

## 2021-09-15 NOTE — H&P (Signed)
[facility]   Primary Care Physician:  Steele Sizer, MD Ophthalmologist: Dr. George Ina  Pre-Procedure History & Physical: HPI:  Amber Reyes is a 76 y.o. female here for cataract surgery.   Past Medical History:  Diagnosis Date   Allergic rhinitis, cause unspecified    Anxiety    Chronic diastolic CHF (congestive heart failure) (HCC)    CKD (chronic kidney disease), stage III (HCC)    Edema    Essential hypertension, benign    Gout, unspecified    Heart murmur    as child   Hyperlipidemia    Lipoma of unspecified site    Other and unspecified disc disorder of lumbar region    Other general symptoms(780.99)    PAC (premature atrial contraction)    Renal insufficiency    Restless legs syndrome (RLS)    Sebaceous cyst    Super obesity    Type II or unspecified type diabetes mellitus without mention of complication, uncontrolled    Unspecified asthma(493.90)    Unspecified disease of pericardium    Unspecified sleep apnea    Unspecified vitamin D deficiency    Vaginitis and vulvovaginitis, unspecified    Venous insufficiency     Past Surgical History:  Procedure Laterality Date   CARDIAC CATHETERIZATION  2002   [facility]   COLONOSCOPY WITH PROPOFOL N/A 09/25/2018   Procedure: COLONOSCOPY WITH PROPOFOL;  Surgeon: Jonathon Bellows, MD;  Location: [facility] ENDOSCOPY;  Service: Gastroenterology;  Laterality: N/A;   COLONOSCOPY WITH PROPOFOL N/A 04/13/2019   Procedure: COLONOSCOPY WITH PROPOFOL;  Surgeon: Jonathon Bellows, MD;  Location: [facility] ENDOSCOPY;  Service: Gastroenterology;  Laterality: N/A;   COLONOSCOPY WITH PROPOFOL N/A 06/27/2020   Procedure: COLONOSCOPY WITH PROPOFOL;  Surgeon: Jonathon Bellows, MD;  Location: [facility] ENDOSCOPY;  Service: Gastroenterology;  Laterality: N/A;   PERICARDIUM SURGERY      Prior to Admission medications   Medication Sig Start Date End Date Taking? Authorizing Provider  acetaminophen (TYLENOL) 650 MG CR tablet Take 650 mg by mouth in the morning  and at bedtime.   Yes [provider]  albuterol (PROVENTIL HFA;VENTOLIN HFA) 108 (90 BASE) MCG/ACT inhaler Inhale 2 puffs into the lungs every 6 (six) hours as needed for wheezing or shortness of breath.   Yes [provider]  allopurinol (ZYLOPRIM) 100 MG tablet Take 1 tablet (100 mg total) by mouth 2 (two) times daily. 07/14/21  Yes Sowles, Drue Stager, MD  apixaban (ELIQUIS) 5 MG TABS tablet Take 1 tablet (5 mg total) by mouth 2 (two) times daily. 09/11/21  Yes Wellington Hampshire, MD  Blood Glucose Calibration (TRUE METRIX LEVEL 1) Low SOLN 1 each by In Vitro route once a week. 12/12/17  Yes Sowles, Drue Stager, MD  Blood Glucose Monitoring Suppl (TRUE METRIX AIR GLUCOSE METER) w/Device KIT Inject 1 each into the skin 4 (four) times daily as needed. Check fsbs 4 times daily E11.22 11/01/18  Yes Sowles, Drue Stager, MD  Cholecalciferol (VITAMIN D) 2000 UNITS tablet Take 2,000 Units by mouth daily.   Yes [provider]  colchicine (COLCRYS) 0.6 MG tablet Take 1-2 tablets (0.6-1.2 mg total) by mouth daily as needed. 11/25/17  Yes Sowles, Drue Stager, MD  diltiazem (CARDIZEM CD) 240 MG 24 hr capsule Take 1 capsule (240 mg total) by mouth daily. 04/29/21  Yes Wellington Hampshire, MD  DROPLET PEN NEEDLES 30G X 8 MM MISC USE TWICE DAILY WITH INSULIN 04/15/21  Yes Sowles, Drue Stager, MD  Dulaglutide (TRULICITY) 1.5 UM/3.5TI SOPN Inject 1.5 mg  into the skin once a week. 05/12/20  Yes Steele Sizer, MD  Elastic Bandages & Supports (MEDICAL COMPRESSION STOCKINGS) [place] 2 each by Does not apply route daily. 04/20/16  Yes Sowles, Drue Stager, MD  fluticasone (FLONASE) 50 MCG/ACT nasal spray Place 2 sprays into both nostrils daily. 12/15/20  Yes Sowles, Drue Stager, MD  fluticasone-salmeterol (ADVAIR) 250-50 MCG/ACT AEPB INHALE 1 PUFF BY MOUTH TWICE DAILY 07/14/21  Yes Sowles, Drue Stager, MD  furosemide (LASIX) 40 MG tablet TAKE 1 TABLET (40 MG TOTAL) BY MOUTH 2 (TWO) TIMES DAILY AS NEEDED. 10/31/19  Yes Sowles, Drue Stager, MD   glucose blood (TRUE METRIX BLOOD GLUCOSE TEST) test strip CHECK BLOOD SUGAR FOUR TIMES DAILY 04/21/21  Yes Sowles, Drue Stager, MD  Insulin Lispro Prot & Lispro (HUMALOG MIX 75/25 KWIKPEN) (75-25) 100 UNIT/ML Kwikpen Inject 10 Units into the skin 2 (two) times daily with a meal. 11/19/20  Yes Sowles, Drue Stager, MD  irbesartan (AVAPRO) 300 MG tablet Take 1 tablet (300 mg total) by mouth daily. 07/14/21  Yes Sowles, Drue Stager, MD  pravastatin (PRAVACHOL) 80 MG tablet Take 1 tablet (80 mg total) by mouth every evening. 07/14/21  Yes Sowles, Drue Stager, MD  TRUEplus Lancets 33G MISC 1 each by Does not apply route 3 (three) times daily as needed. 04/24/21  Yes Sowles, Drue Stager, MD  potassium chloride SA (KLOR-CON) 20 MEQ tablet Take 1 tablet (20 mEq total) by mouth daily. With furosemide Patient not taking: Reported on 08/27/2021 07/14/21   Steele Sizer, MD    Allergies as of 08/20/2021   (No Known Allergies)    Family History  Problem Relation Age of Onset   Heart attack Mother    Hypertension Mother    Breast cancer Neg Hx     Social History   Socioeconomic History   Marital status: Married    Spouse name: Not on file   Number of children: 3   Years of education: Not on file   Highest education level: Associate degree: academic program  Occupational History   Occupation: retired  Tobacco Use   Smoking status: Never   Smokeless tobacco: Never   Tobacco comments:    n/a  Vaping Use   Vaping Use: Never used  Substance and Sexual Activity   Alcohol use: No    Alcohol/week: 0.0 standard drinks   Drug use: No   Sexual activity: Not Currently  Other Topics Concern   Not on file  Social History Narrative   Not on file   Social Determinants of Health   Financial Resource Strain: Medium Risk   Difficulty of Paying Living Expenses: Somewhat hard  Food Insecurity: No Food Insecurity   Worried About Charity fundraiser in the Last Year: Never true   [place] in the Last Year: Never  true  Transportation Needs: No Transportation Needs   Lack of Transportation (Medical): No   Lack of Transportation (Non-Medical): No  Physical Activity: Inactive   Days of Exercise per Week: 0 days   Minutes of Exercise per Session: 0 min  Stress: No Stress Concern Present   Feeling of Stress : Not at all  Social Connections: Socially Integrated   Frequency of Communication with Friends and Family: More than three times a week   Frequency of Social Gatherings with Friends and Family: Once a week   Attends Religious Services: More than 4 times per year   Active Member of [employer] or Organizations: Yes   Attends Archivist Meetings: More than 4 times per  year   Marital Status: Married  Human resources officer Violence: Not At Risk   Fear of Current or Ex-Partner: No   Emotionally Abused: No   Physically Abused: No   Sexually Abused: No    Review of Systems: See HPI, otherwise negative ROS  Physical Exam: BP (!) 174/64    Pulse 68    Temp 97.7 F (36.5 C) (Temporal)    Resp 20    Ht 5' 10" (1.778 m)    Wt (!) 160 kg    SpO2 100%    BMI 50.61 kg/m  General:   Alert, cooperative in NAD Head:  Normocephalic and atraumatic. Respiratory:  Normal work of breathing. Cardiovascular:  RRR  Impression/Plan: Amber Reyes is here for cataract surgery.  Risks, benefits, limitations, and alternatives regarding cataract surgery have been reviewed with the patient.  Questions have been answered.  All parties agreeable.   Birder Robson, MD  09/15/2021, 10:44 AM

## 2021-09-15 NOTE — Op Note (Signed)
PREOPERATIVE DIAGNOSIS:  Nuclear sclerotic cataract of the right eye.   POSTOPERATIVE DIAGNOSIS:  H25.11 Cataract   OPERATIVE PROCEDURE:ORPROCALL@   SURGEON:  Birder Robson, MD.   ANESTHESIA:  Anesthesiologist: Veda Canning, MD CRNA: Mayme Genta, CRNA; Cameron Ali, CRNA  1.      Managed anesthesia care. 2.      0.74ml of Shugarcaine was instilled in the eye following the paracentesis.   COMPLICATIONS:  None.   TECHNIQUE:   Stop and chop   DESCRIPTION OF PROCEDURE:  The patient was examined and consented in the preoperative holding area where the aforementioned topical anesthesia was applied to the right eye and then brought back to the Operating Room where the right eye was prepped and draped in the usual sterile ophthalmic fashion and a lid speculum was placed. A paracentesis was created with the side port blade and the anterior chamber was filled with viscoelastic. A near clear corneal incision was performed with the steel keratome. A continuous curvilinear capsulorrhexis was performed with a cystotome followed by the capsulorrhexis forceps. Hydrodissection and hydrodelineation were carried out with BSS on a blunt cannula. The lens was removed in a stop and chop  technique and the remaining cortical material was removed with the irrigation-aspiration handpiece. The capsular bag was inflated with viscoelastic and the Technis ZCB00  lens was placed in the capsular bag without complication. The remaining viscoelastic was removed from the eye with the irrigation-aspiration handpiece. The wounds were hydrated. The anterior chamber was flushed with BSS and the eye was inflated to physiologic pressure. 0.56ml of Vigamox was placed in the anterior chamber. The wounds were found to be water tight. The eye was dressed with Combigan. The patient was given protective glasses to wear throughout the day and a shield with which to sleep tonight. The patient was also given drops with which to begin a  drop regimen today and will follow-up with me in one day. Implant Name Type Inv. Item Serial No. Manufacturer Lot No. LRB No. Used Action  LENS IOL TECNIS EYHANCE 20.0 - [id number] Intraocular Lens LENS IOL TECNIS EYHANCE 20.0 [id number] [employer]  Right 1 Implanted   Procedure(s): CATARACT EXTRACTION PHACO AND INTRAOCULAR LENS PLACEMENT (IOC) RIGHT 10.19 01:01.8 (Right)  Electronically signed: Birder Robson 09/15/2021 11:09 AM

## 2021-09-15 NOTE — Transfer of Care (Signed)
Immediate Anesthesia Transfer of Care Note  Patient: Amber Reyes  Procedure(s) Performed: CATARACT EXTRACTION PHACO AND INTRAOCULAR LENS PLACEMENT (IOC) RIGHT 10.19 01:01.8 (Right: Eye)  Patient Location: PACU  Anesthesia Type: MAC  Level of Consciousness: awake, alert  and patient cooperative  Airway and Oxygen Therapy: Patient Spontanous Breathing and Patient connected to supplemental oxygen  Post-op Assessment: Post-op Vital signs reviewed, Patient's Cardiovascular Status Stable, Respiratory Function Stable, Patent Airway and No signs of Nausea or vomiting  Post-op Vital Signs: Reviewed and stable  Complications: No notable events documented.

## 2021-09-16 ENCOUNTER — Encounter: Payer: Self-pay | Admitting: Ophthalmology

## 2021-09-16 ENCOUNTER — Other Ambulatory Visit: Payer: Self-pay

## 2021-09-21 DIAGNOSIS — H2512 Age-related nuclear cataract, left eye: Secondary | ICD-10-CM | POA: Diagnosis not present

## 2021-09-22 DIAGNOSIS — I129 Hypertensive chronic kidney disease with stage 1 through stage 4 chronic kidney disease, or unspecified chronic kidney disease: Secondary | ICD-10-CM

## 2021-09-22 DIAGNOSIS — Z794 Long term (current) use of insulin: Secondary | ICD-10-CM | POA: Diagnosis not present

## 2021-09-22 DIAGNOSIS — N1831 Chronic kidney disease, stage 3a: Secondary | ICD-10-CM

## 2021-09-22 DIAGNOSIS — N183 Chronic kidney disease, stage 3 unspecified: Secondary | ICD-10-CM | POA: Diagnosis not present

## 2021-09-22 DIAGNOSIS — E1122 Type 2 diabetes mellitus with diabetic chronic kidney disease: Secondary | ICD-10-CM

## 2021-09-24 ENCOUNTER — Telehealth: Payer: Self-pay

## 2021-09-24 NOTE — Discharge Instructions (Signed)

## 2021-09-24 NOTE — Progress Notes (Signed)
° ° °  Chronic Care Management Pharmacy Assistant   Name: Amber Reyes  MRN: [id number] DOB: 1946-02-12  Patient Assistance Renewal Application Trulicity with [facility]  The patient turned in her 0762 Renewal Application for her Trulicity. There was an issue with the Trulicity prescription that was faxed over, and we had to refax it.   I am reaching out to the foundation to check the status of the patient's Trulicity, and to make sure they did receive the fax this time. I spoke with a representative who confirmed they did receive the updated prescription on 09/02/2021 but they had not processed the prescription at this time. The representative stated she would escalate to her supervisor, and for me to call back on Monday for status.   09/28/2021   I contacted [employer] to check the status of the patient's prescription per the representative they should have an answer for me today. I spoke with a representative who confirmed that they received everything fine and the patient's prescription was sent for processing. The representative asked me to hold while she processes the patient's prescription since the patient is almost out of medication, and during the hold we were disconnected. I had to call back and I was placed again on an extended hold.   I called back, and spoke with a new representative who advised that the prescription was processed. The representative advised that the patient should have her shipment by Monday. I called the patient to give her the update, and encourage her to call me if she has not received the prescription on Monday.    [name], CPA/CMA Clinical Pharmacist Assistant Phone: [phone]

## 2021-09-29 ENCOUNTER — Ambulatory Visit: Payer: Medicare PPO | Admitting: Anesthesiology

## 2021-09-29 ENCOUNTER — Encounter: Payer: Self-pay | Admitting: Ophthalmology

## 2021-09-29 ENCOUNTER — Other Ambulatory Visit: Payer: Self-pay

## 2021-09-29 ENCOUNTER — Encounter
Admission: RE | Disposition: A | Discharge status: Home / Self Care | Payer: Self-pay | Source: Ambulatory Visit | Attending: Ophthalmology

## 2021-09-29 ENCOUNTER — Ambulatory Visit
Admission: RE | Admit: 2021-09-29 | Discharge: 2021-09-29 | Disposition: A | Discharge status: Home / Self Care | Payer: Medicare PPO | Source: Ambulatory Visit | Attending: Ophthalmology | Admitting: Ophthalmology

## 2021-09-29 DIAGNOSIS — I130 Hypertensive heart and chronic kidney disease with heart failure and stage 1 through stage 4 chronic kidney disease, or unspecified chronic kidney disease: Secondary | ICD-10-CM | POA: Insufficient documentation

## 2021-09-29 DIAGNOSIS — N183 Chronic kidney disease, stage 3 unspecified: Secondary | ICD-10-CM | POA: Insufficient documentation

## 2021-09-29 DIAGNOSIS — E1122 Type 2 diabetes mellitus with diabetic chronic kidney disease: Secondary | ICD-10-CM | POA: Insufficient documentation

## 2021-09-29 DIAGNOSIS — H2512 Age-related nuclear cataract, left eye: Secondary | ICD-10-CM | POA: Insufficient documentation

## 2021-09-29 DIAGNOSIS — I5032 Chronic diastolic (congestive) heart failure: Secondary | ICD-10-CM | POA: Diagnosis not present

## 2021-09-29 DIAGNOSIS — J45909 Unspecified asthma, uncomplicated: Secondary | ICD-10-CM | POA: Diagnosis not present

## 2021-09-29 DIAGNOSIS — H25812 Combined forms of age-related cataract, left eye: Secondary | ICD-10-CM | POA: Diagnosis not present

## 2021-09-29 DIAGNOSIS — G4730 Sleep apnea, unspecified: Secondary | ICD-10-CM | POA: Diagnosis not present

## 2021-09-29 HISTORY — PX: CATARACT EXTRACTION W/PHACO: SHX586

## 2021-09-29 LAB — GLUCOSE, CAPILLARY: Glucose-Capillary: 144 mg/dL — ABNORMAL HIGH (ref 70–99)

## 2021-09-29 SURGERY — PHACOEMULSIFICATION, CATARACT, WITH IOL INSERTION
Anesthesia: Monitor Anesthesia Care | Site: Eye | Laterality: Left

## 2021-09-29 MED ORDER — MOXIFLOXACIN HCL 0.5 % OP SOLN
OPHTHALMIC | Status: DC | PRN
  Administered 2021-09-29: 0.2 mL via OPHTHALMIC

## 2021-09-29 MED ORDER — ACETAMINOPHEN 160 MG/5ML PO SOLN: 325.0000 mg | ORAL | Status: DC | PRN

## 2021-09-29 MED ORDER — LACTATED RINGERS IV SOLN: INTRAVENOUS | Status: DC

## 2021-09-29 MED ORDER — TETRACAINE HCL 0.5 % OP SOLN
1.0000 [drp] | OPHTHALMIC | Status: DC | PRN
  Administered 2021-09-29 (×3): 1 [drp] via OPHTHALMIC

## 2021-09-29 MED ORDER — SIGHTPATH DOSE#1 NA CHONDROIT SULF-NA HYALURON 40-17 MG/ML IO SOLN
INTRAOCULAR | Status: DC | PRN
  Administered 2021-09-29: 1 mL via INTRAOCULAR

## 2021-09-29 MED ORDER — ARMC OPHTHALMIC DILATING DROPS
1.0000 "application " | OPHTHALMIC | Status: DC | PRN
  Administered 2021-09-29 (×3): 1 via OPHTHALMIC

## 2021-09-29 MED ORDER — FENTANYL CITRATE (PF) 100 MCG/2ML IJ SOLN
INTRAMUSCULAR | Status: DC | PRN
  Administered 2021-09-29: 50 ug via INTRAVENOUS

## 2021-09-29 MED ORDER — SIGHTPATH DOSE#1 BSS IO SOLN
INTRAOCULAR | Status: DC | PRN
  Administered 2021-09-29: 84 mL via OPHTHALMIC

## 2021-09-29 MED ORDER — ACETAMINOPHEN 325 MG PO TABS: 325.0000 mg | ORAL_TABLET | ORAL | Status: DC | PRN

## 2021-09-29 MED ORDER — SIGHTPATH DOSE#1 BSS IO SOLN
INTRAOCULAR | Status: DC | PRN
  Administered 2021-09-29: 2 mL

## 2021-09-29 MED ORDER — SIGHTPATH DOSE#1 BSS IO SOLN
INTRAOCULAR | Status: DC | PRN
  Administered 2021-09-29: 15 mL via INTRAOCULAR

## 2021-09-29 MED ORDER — MIDAZOLAM HCL 2 MG/2ML IJ SOLN
INTRAMUSCULAR | Status: DC | PRN
  Administered 2021-09-29: 2 mg via INTRAVENOUS

## 2021-09-29 MED ORDER — BRIMONIDINE TARTRATE-TIMOLOL 0.2-0.5 % OP SOLN
OPHTHALMIC | Status: DC | PRN
  Administered 2021-09-29: 1 [drp] via OPHTHALMIC

## 2021-09-29 SURGICAL SUPPLY — 12 items
CANNULA ANT/CHMB 27G (MISCELLANEOUS) IMPLANT
CANNULA ANT/CHMB 27GA (MISCELLANEOUS) ×2 IMPLANT
CATARACT SUITE SIGHTPATH (MISCELLANEOUS) ×2 IMPLANT
FEE CATARACT SUITE SIGHTPATH (MISCELLANEOUS) ×1 IMPLANT
GLOVE SURG ENC TEXT LTX SZ8 (GLOVE) ×2 IMPLANT
GLOVE SURG TRIUMPH 8.0 PF LTX (GLOVE) ×2 IMPLANT
LENS IOL TECNIS EYHANCE 19.5 (Intraocular Lens) ×1 IMPLANT
NDL FILTER BLUNT 18X1 1/2 (NEEDLE) ×1 IMPLANT
NEEDLE FILTER BLUNT 18X 1/2SAF (NEEDLE) ×1
NEEDLE FILTER BLUNT 18X1 1/2 (NEEDLE) ×1 IMPLANT
SYR 3ML LL SCALE MARK (SYRINGE) ×2 IMPLANT
WATER STERILE IRR 250ML POUR (IV SOLUTION) ×2 IMPLANT

## 2021-09-29 NOTE — Anesthesia Postprocedure Evaluation (Signed)
Anesthesia Post Note  Patient: Amber Reyes  Procedure(s) Performed: CATARACT EXTRACTION PHACO AND INTRAOCULAR LENS PLACEMENT (IOC) LEFT DIABETIC 11.40 01:21.2 (Left: Eye)     Patient location during evaluation: PACU Anesthesia Type: MAC Level of consciousness: awake and alert Pain management: pain level controlled Vital Signs Assessment: post-procedure vital signs reviewed and stable Respiratory status: spontaneous breathing, nonlabored ventilation, respiratory function stable and patient connected to nasal cannula oxygen Cardiovascular status: stable and blood pressure returned to baseline Postop Assessment: no apparent nausea or vomiting Anesthetic complications: no   No notable events documented.  Trecia Rogers

## 2021-09-29 NOTE — Op Note (Signed)
PREOPERATIVE DIAGNOSIS:  Nuclear sclerotic cataract of the left eye.   POSTOPERATIVE DIAGNOSIS:  Nuclear sclerotic cataract of the left eye.   OPERATIVE PROCEDURE:ORPROCALL@   SURGEON:  Birder Robson, MD.   ANESTHESIA:  Anesthesiologist: Rochel Brome, MD CRNA: Jeannene Patella, CRNA  1.      Managed anesthesia care. 2.     0.57ml of Shugarcaine was instilled following the paracentesis   COMPLICATIONS:  None.   TECHNIQUE:   Stop and chop   DESCRIPTION OF PROCEDURE:  The patient was examined and consented in the preoperative holding area where the aforementioned topical anesthesia was applied to the left eye and then brought back to the Operating Room where the left eye was prepped and draped in the usual sterile ophthalmic fashion and a lid speculum was placed. A paracentesis was created with the side port blade and the anterior chamber was filled with viscoelastic. A near clear corneal incision was performed with the steel keratome. A continuous curvilinear capsulorrhexis was performed with a cystotome followed by the capsulorrhexis forceps. Hydrodissection and hydrodelineation were carried out with BSS on a blunt cannula. The lens was removed in a stop and chop  technique and the remaining cortical material was removed with the irrigation-aspiration handpiece. The capsular bag was inflated with viscoelastic and the Technis [id number] lens was placed in the capsular bag without complication. The remaining viscoelastic was removed from the eye with the irrigation-aspiration handpiece. The wounds were hydrated. The anterior chamber was flushed with BSS and the eye was inflated to physiologic pressure. 0.41ml Vigamox was placed in the anterior chamber. The wounds were found to be water tight. The eye was dressed with Combigan. The patient was given protective glasses to wear throughout the day and a shield with which to sleep tonight. The patient was also given drops with which to begin a drop  regimen today and will follow-up with me in one day. Implant Name Type Inv. Item Serial No. Manufacturer Lot No. LRB No. Used Action  LENS IOL TECNIS EYHANCE 19.5 - [id number] Intraocular Lens LENS IOL TECNIS EYHANCE 19.5 [id number] [employer]  Left 1 Implanted    Procedure(s): CATARACT EXTRACTION PHACO AND INTRAOCULAR LENS PLACEMENT (IOC) LEFT DIABETIC 11.40 01:21.2 (Left)  Electronically signed: Birder Robson 09/29/2021 7:50 AM

## 2021-09-29 NOTE — Anesthesia Procedure Notes (Signed)
Procedure Name: MAC Date/Time: 09/29/2021 7:31 AM Performed by: Jeannene Patella, CRNA Pre-anesthesia Checklist: Patient identified, Emergency Drugs available, Suction available, Timeout performed and Patient being monitored Patient Re-evaluated:Patient Re-evaluated prior to induction Oxygen Delivery Method: Nasal cannula Placement Confirmation: positive ETCO2

## 2021-09-29 NOTE — Anesthesia Preprocedure Evaluation (Signed)
Anesthesia Evaluation  Patient identified by MRN, date of birth, ID band Patient awake    Reviewed: Allergy & Precautions, H&P , NPO status , Patient's Chart, lab work & pertinent test results, reviewed documented beta blocker date and time   Airway Mallampati: III  TM Distance: >3 FB Neck ROM: full    Dental no notable dental hx.    Pulmonary asthma , sleep apnea ,    Pulmonary exam normal breath sounds clear to auscultation       Cardiovascular Exercise Tolerance: Good hypertension, +CHF  Normal cardiovascular exam Rhythm:regular Rate:Normal  Pulmonary Artery Hypertension   Neuro/Psych negative neurological ROS  negative psych ROS   GI/Hepatic negative GI ROS, Neg liver ROS,   Endo/Other  negative endocrine ROSdiabetes, Type 2  Renal/GU CRFRenal disease  negative genitourinary   Musculoskeletal   Abdominal   Peds  Hematology negative hematology ROS (+)   Anesthesia Other Findings   Reproductive/Obstetrics negative OB ROS                             Anesthesia Physical Anesthesia Plan  ASA: 3  Anesthesia Plan: MAC   Post-op Pain Management:    Induction:   PONV Risk Score and Plan:   Airway Management Planned:   Additional Equipment:   Intra-op Plan:   Post-operative Plan:   Informed Consent: I have reviewed the patients History and Physical, chart, labs and discussed the procedure including the risks, benefits and alternatives for the proposed anesthesia with the patient or authorized representative who has indicated his/her understanding and acceptance.     Dental Advisory Given  Plan Discussed with: CRNA and Anesthesiologist  Anesthesia Plan Comments:         Anesthesia Quick Evaluation

## 2021-09-29 NOTE — H&P (Signed)
[facility]   Primary Care Physician:  Steele Sizer, MD Ophthalmologist: Dr. George Ina  Pre-Procedure History & Physical: HPI:  Amber Reyes is a 76 y.o. female here for cataract surgery.   Past Medical History:  Diagnosis Date   Allergic rhinitis, cause unspecified    Anxiety    Chronic diastolic CHF (congestive heart failure) (HCC)    CKD (chronic kidney disease), stage III (HCC)    Edema    Essential hypertension, benign    Gout, unspecified    Heart murmur    as child   Hyperlipidemia    Lipoma of unspecified site    Other and unspecified disc disorder of lumbar region    Other general symptoms(780.99)    PAC (premature atrial contraction)    Renal insufficiency    Restless legs syndrome (RLS)    Sebaceous cyst    Super obesity    Type II or unspecified type diabetes mellitus without mention of complication, uncontrolled    Unspecified asthma(493.90)    Unspecified disease of pericardium    Unspecified sleep apnea    Unspecified vitamin D deficiency    Vaginitis and vulvovaginitis, unspecified    Venous insufficiency     Past Surgical History:  Procedure Laterality Date   CARDIAC CATHETERIZATION  2002   [facility]   CATARACT EXTRACTION W/PHACO Right 09/15/2021   Procedure: CATARACT EXTRACTION PHACO AND INTRAOCULAR LENS PLACEMENT (IOC) RIGHT 10.19 01:01.8;  Surgeon: Birder Robson, MD;  Location: [place];  Service: Ophthalmology;  Laterality: Right;   COLONOSCOPY WITH PROPOFOL N/A 09/25/2018   Procedure: COLONOSCOPY WITH PROPOFOL;  Surgeon: Jonathon Bellows, MD;  Location: [facility] ENDOSCOPY;  Service: Gastroenterology;  Laterality: N/A;   COLONOSCOPY WITH PROPOFOL N/A 04/13/2019   Procedure: COLONOSCOPY WITH PROPOFOL;  Surgeon: Jonathon Bellows, MD;  Location: [facility] ENDOSCOPY;  Service: Gastroenterology;  Laterality: N/A;   COLONOSCOPY WITH PROPOFOL N/A 06/27/2020   Procedure: COLONOSCOPY WITH PROPOFOL;  Surgeon: Jonathon Bellows, MD;  Location: [facility] ENDOSCOPY;   Service: Gastroenterology;  Laterality: N/A;   PERICARDIUM SURGERY      Prior to Admission medications   Medication Sig Start Date End Date Taking? Authorizing Provider  acetaminophen (TYLENOL) 650 MG CR tablet Take 650 mg by mouth in the morning and at bedtime.   Yes [provider]  albuterol (PROVENTIL HFA;VENTOLIN HFA) 108 (90 BASE) MCG/ACT inhaler Inhale 2 puffs into the lungs every 6 (six) hours as needed for wheezing or shortness of breath.   Yes [provider]  allopurinol (ZYLOPRIM) 100 MG tablet Take 1 tablet (100 mg total) by mouth 2 (two) times daily. 07/14/21  Yes Sowles, Drue Stager, MD  apixaban (ELIQUIS) 5 MG TABS tablet Take 1 tablet (5 mg total) by mouth 2 (two) times daily. 09/11/21  Yes Wellington Hampshire, MD  Cholecalciferol (VITAMIN D) 2000 UNITS tablet Take 2,000 Units by mouth daily.   Yes [provider]  colchicine (COLCRYS) 0.6 MG tablet Take 1-2 tablets (0.6-1.2 mg total) by mouth daily as needed. 11/25/17  Yes Sowles, Drue Stager, MD  diltiazem (CARDIZEM CD) 240 MG 24 hr capsule Take 1 capsule (240 mg total) by mouth daily. 04/29/21  Yes Wellington Hampshire, MD  Dulaglutide (TRULICITY) 1.5 [place]/7.0WC SOPN Inject 1.5 mg into the skin once a week. 05/12/20  Yes Sowles, Drue Stager, MD  fluticasone-salmeterol (ADVAIR) 250-50 MCG/ACT AEPB INHALE 1 PUFF BY MOUTH TWICE DAILY 07/14/21  Yes Sowles, Drue Stager, MD  furosemide (LASIX) 40 MG tablet TAKE 1 TABLET (40 MG TOTAL) BY MOUTH  2 (TWO) TIMES DAILY AS NEEDED. 10/31/19  Yes Sowles, Drue Stager, MD  Insulin Lispro Prot & Lispro (HUMALOG MIX 75/25 KWIKPEN) (75-25) 100 UNIT/ML Kwikpen Inject 10 Units into the skin 2 (two) times daily with a meal. 11/19/20  Yes Sowles, Drue Stager, MD  irbesartan (AVAPRO) 300 MG tablet Take 1 tablet (300 mg total) by mouth daily. 07/14/21  Yes Sowles, Drue Stager, MD  pravastatin (PRAVACHOL) 80 MG tablet Take 1 tablet (80 mg total) by mouth every evening. 07/14/21  Yes Sowles, Drue Stager, MD  Blood Glucose  Calibration (TRUE METRIX LEVEL 1) Low SOLN 1 each by In Vitro route once a week. 12/12/17   Steele Sizer, MD  Blood Glucose Monitoring Suppl (TRUE METRIX AIR GLUCOSE METER) w/Device KIT Inject 1 each into the skin 4 (four) times daily as needed. Check fsbs 4 times daily E11.22 11/01/18   Steele Sizer, MD  DROPLET PEN NEEDLES 30G X 8 MM MISC USE TWICE DAILY WITH INSULIN 04/15/21   Steele Sizer, MD  Elastic Bandages & Supports (MEDICAL COMPRESSION STOCKINGS) MISC 2 each by Does not apply route daily. 04/20/16   Steele Sizer, MD  fluticasone (FLONASE) 50 MCG/ACT nasal spray Place 2 sprays into both nostrils daily. 12/15/20   Steele Sizer, MD  glucose blood (TRUE METRIX BLOOD GLUCOSE TEST) test strip CHECK BLOOD SUGAR FOUR TIMES DAILY 04/21/21   Ancil Boozer, Drue Stager, MD  potassium chloride SA (KLOR-CON) 20 MEQ tablet Take 1 tablet (20 mEq total) by mouth daily. With furosemide Patient not taking: Reported on 08/27/2021 07/14/21   Steele Sizer, MD  TRUEplus Lancets 33G MISC 1 each by Does not apply route 3 (three) times daily as needed. 04/24/21   Steele Sizer, MD    Allergies as of 08/20/2021   (No Known Allergies)    Family History  Problem Relation Age of Onset   Heart attack Mother    Hypertension Mother    Breast cancer Neg Hx     Social History   Socioeconomic History   Marital status: Married    Spouse name: Not on file   Number of children: 3   Years of education: Not on file   Highest education level: Associate degree: academic program  Occupational History   Occupation: retired  Tobacco Use   Smoking status: Never   Smokeless tobacco: Never   Tobacco comments:    n/a  Vaping Use   Vaping Use: Never used  Substance and Sexual Activity   Alcohol use: No    Alcohol/week: 0.0 standard drinks   Drug use: No   Sexual activity: Not Currently  Other Topics Concern   Not on file  Social History Narrative   Not on file   Social Determinants of Health   Financial  Resource Strain: Medium Risk   Difficulty of Paying Living Expenses: Somewhat hard  Food Insecurity: No Food Insecurity   Worried About Charity fundraiser in the Last Year: Never true   Ran Out of Food in the Last Year: Never true  Transportation Needs: No Transportation Needs   Lack of Transportation (Medical): No   Lack of Transportation (Non-Medical): No  Physical Activity: Inactive   Days of Exercise per Week: 0 days   Minutes of Exercise per Session: 0 min  Stress: No Stress Concern Present   Feeling of Stress : Not at all  Social Connections: Socially Integrated   Frequency of Communication with Friends and Family: More than three times a week   Frequency of Social Gatherings with Friends and  Family: Once a week   Attends Religious Services: More than 4 times per year   Active Member of Clubs or Organizations: Yes   Attends Music therapist: More than 4 times per year   Marital Status: Married  Human resources officer Violence: Not At Risk   Fear of Current or Ex-Partner: No   Emotionally Abused: No   Physically Abused: No   Sexually Abused: No    Review of Systems: See HPI, otherwise negative ROS  Physical Exam: BP (!) 160/59    Pulse 68    Temp 97.7 F (36.5 C)    Ht '5\' 10"'  (1.778 m)    Wt (!) 158.8 kg    SpO2 100%    BMI 50.22 kg/m  General:   Alert, cooperative in NAD Head:  Normocephalic and atraumatic. Respiratory:  Normal work of breathing. Cardiovascular:  RRR  Impression/Plan: Amber Reyes is here for cataract surgery.  Risks, benefits, limitations, and alternatives regarding cataract surgery have been reviewed with the patient.  Questions have been answered.  All parties agreeable.   Birder Robson, MD  09/29/2021, 7:18 AM

## 2021-09-29 NOTE — Transfer of Care (Signed)
Immediate Anesthesia Transfer of Care Note  Patient: Amber Reyes  Procedure(s) Performed: CATARACT EXTRACTION PHACO AND INTRAOCULAR LENS PLACEMENT (IOC) LEFT DIABETIC 11.40 01:21.2 (Left: Eye)  Patient Location: PACU  Anesthesia Type: MAC  Level of Consciousness: awake, alert  and patient cooperative  Airway and Oxygen Therapy: Patient Spontanous Breathing and Patient connected to supplemental oxygen  Post-op Assessment: Post-op Vital signs reviewed, Patient's Cardiovascular Status Stable, Respiratory Function Stable, Patent Airway and No signs of Nausea or vomiting  Post-op Vital Signs: Reviewed and stable  Complications: No notable events documented.

## 2021-09-30 ENCOUNTER — Encounter: Payer: Self-pay | Admitting: Ophthalmology

## 2021-10-05 ENCOUNTER — Other Ambulatory Visit: Payer: Self-pay

## 2021-10-09 DIAGNOSIS — E559 Vitamin D deficiency, unspecified: Secondary | ICD-10-CM | POA: Diagnosis not present

## 2021-10-09 DIAGNOSIS — N183 Chronic kidney disease, stage 3 unspecified: Secondary | ICD-10-CM | POA: Diagnosis not present

## 2021-10-09 DIAGNOSIS — D631 Anemia in chronic kidney disease: Secondary | ICD-10-CM | POA: Diagnosis not present

## 2021-10-22 ENCOUNTER — Other Ambulatory Visit: Payer: Self-pay | Admitting: Cardiovascular Disease

## 2021-10-22 DIAGNOSIS — I10 Essential (primary) hypertension: Secondary | ICD-10-CM

## 2021-10-22 DIAGNOSIS — I110 Hypertensive heart disease with heart failure: Secondary | ICD-10-CM

## 2021-10-22 DIAGNOSIS — I4891 Unspecified atrial fibrillation: Secondary | ICD-10-CM

## 2021-10-23 ENCOUNTER — Other Ambulatory Visit: Payer: Self-pay | Admitting: Family Medicine

## 2021-10-23 DIAGNOSIS — Z1231 Encounter for screening mammogram for malignant neoplasm of breast: Secondary | ICD-10-CM

## 2021-10-28 DIAGNOSIS — N183 Chronic kidney disease, stage 3 unspecified: Secondary | ICD-10-CM | POA: Diagnosis not present

## 2021-10-28 DIAGNOSIS — E559 Vitamin D deficiency, unspecified: Secondary | ICD-10-CM | POA: Diagnosis not present

## 2021-10-28 DIAGNOSIS — E1322 Other specified diabetes mellitus with diabetic chronic kidney disease: Secondary | ICD-10-CM | POA: Diagnosis not present

## 2021-10-28 DIAGNOSIS — D631 Anemia in chronic kidney disease: Secondary | ICD-10-CM | POA: Diagnosis not present

## 2021-10-28 DIAGNOSIS — N185 Chronic kidney disease, stage 5: Secondary | ICD-10-CM | POA: Diagnosis not present

## 2021-10-28 NOTE — Progress Notes (Signed)
Cardiology Office Note   Date:  10/29/2021   ID:  Amber BALLENGEE, DOB 1946-06-23, MRN [id number]  PCP:  Steele Sizer, MD  Cardiologist:   Kathlyn Sacramento, MD   Chief Complaint  Patient presents with   Other    12 Month f/u no complaints today. Meds reviewed verbally with pt.      History of Present Illness: Amber Reyes is a 76 y.o. female who presents for a followup visit regarding chronic diastolic heart failure and chronic atrial fibrillation. 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  Atrial fibrillation is being treated with rate control and anticoagulation. Most recent echocardiogram in 2019 showed normal LV systolic function with severely dilated left atrium and mild pulmonary hypertension. She has been doing well no recent chest pain, shortness of breath or palpitations.  She lost 30 pounds since last year.  She had cataract surgery without complications.  No bleeding side effects with anticoagulation.   Past Medical History:  Diagnosis Date   Allergic rhinitis, cause unspecified    Anxiety    Chronic diastolic CHF (congestive heart failure) (HCC)    CKD (chronic kidney disease), stage III (HCC)    Edema    Essential hypertension, benign    Gout, unspecified    Heart murmur    as child   Hyperlipidemia    Lipoma of unspecified site    Other and unspecified disc disorder of lumbar region    Other general symptoms(780.99)    PAC (premature atrial contraction)    Renal insufficiency    Restless legs syndrome (RLS)    Sebaceous cyst    Super obesity    Type II or unspecified type diabetes mellitus without mention of complication, uncontrolled    Unspecified asthma(493.90)    Unspecified disease of pericardium    Unspecified sleep apnea    Unspecified vitamin D deficiency    Vaginitis and  vulvovaginitis, unspecified    Venous insufficiency     Past Surgical History:  Procedure Laterality Date   CARDIAC CATHETERIZATION  2002   [facility]   CATARACT EXTRACTION W/PHACO Right 09/15/2021   Procedure: CATARACT EXTRACTION PHACO AND INTRAOCULAR LENS PLACEMENT (IOC) RIGHT 10.19 01:01.8;  Surgeon: Birder Robson, MD;  Location: [place];  Service: Ophthalmology;  Laterality: Right;   CATARACT EXTRACTION W/PHACO Left 09/29/2021   Procedure: CATARACT EXTRACTION PHACO AND INTRAOCULAR LENS PLACEMENT (IOC) LEFT DIABETIC 11.40 01:21.2;  Surgeon: Birder Robson, MD;  Location: [place];  Service: Ophthalmology;  Laterality: Left;   COLONOSCOPY WITH PROPOFOL N/A 09/25/2018   Procedure: COLONOSCOPY WITH PROPOFOL;  Surgeon: Jonathon Bellows, MD;  Location: [facility] ENDOSCOPY;  Service: Gastroenterology;  Laterality: N/A;   COLONOSCOPY WITH PROPOFOL N/A 04/13/2019   Procedure: COLONOSCOPY WITH PROPOFOL;  Surgeon: Jonathon Bellows, MD;  Location: [facility] ENDOSCOPY;  Service: Gastroenterology;  Laterality: N/A;   COLONOSCOPY WITH PROPOFOL N/A 06/27/2020   Procedure: COLONOSCOPY WITH PROPOFOL;  Surgeon: Jonathon Bellows, MD;  Location: [facility] ENDOSCOPY;  Service: Gastroenterology;  Laterality: N/A;   PERICARDIUM SURGERY       Current Outpatient Medications  Medication Sig Dispense Refill   acetaminophen (TYLENOL) 650 MG CR tablet Take 650 mg by mouth in the morning and at bedtime.     albuterol (PROVENTIL HFA;VENTOLIN HFA) 108 (90 BASE) MCG/ACT inhaler Inhale 2 puffs into the lungs every 6 (six) hours as needed for wheezing or  shortness of breath.     allopurinol (ZYLOPRIM) 100 MG tablet Take 1 tablet (100 mg total) by mouth 2 (two) times daily. 180 tablet 1   apixaban (ELIQUIS) 5 MG TABS tablet Take 1 tablet (5 mg total) by mouth 2 (two) times daily. 60 tablet 0   Blood Glucose Calibration (TRUE METRIX LEVEL 1) Low SOLN 1 each by In Vitro route once a week. 1 each 1   Blood Glucose Monitoring Suppl  (TRUE METRIX AIR GLUCOSE METER) w/Device KIT Inject 1 each into the skin 4 (four) times daily as needed. Check fsbs 4 times daily E11.22 1 kit 0   Cholecalciferol (VITAMIN D) 2000 UNITS tablet Take 2,000 Units by mouth daily.     colchicine (COLCRYS) 0.6 MG tablet Take 1-2 tablets (0.6-1.2 mg total) by mouth daily as needed. 30 tablet 0   diltiazem (CARDIZEM CD) 240 MG 24 hr capsule TAKE 1 CAPSULE EVERY DAY 90 capsule 0   DROPLET PEN NEEDLES 30G X 8 MM MISC USE TWICE DAILY WITH INSULIN 200 each 3   Dulaglutide (TRULICITY) 1.5 CV/8.9FY SOPN Inject 1.5 mg into the skin once a week. 6 mL 3   Elastic Bandages & Supports (MEDICAL COMPRESSION STOCKINGS) MISC 2 each by Does not apply route daily. 2 each 2   fluticasone (FLONASE) 50 MCG/ACT nasal spray Place 2 sprays into both nostrils daily. 48 g 2   fluticasone-salmeterol (ADVAIR) 250-50 MCG/ACT AEPB INHALE 1 PUFF BY MOUTH TWICE DAILY 180 each 1   furosemide (LASIX) 40 MG tablet TAKE 1 TABLET (40 MG TOTAL) BY MOUTH 2 (TWO) TIMES DAILY AS NEEDED. 180 tablet 1   glucose blood (TRUE METRIX BLOOD GLUCOSE TEST) test strip CHECK BLOOD SUGAR FOUR TIMES DAILY 400 strip 0   Insulin Lispro Prot & Lispro (HUMALOG MIX 75/25 KWIKPEN) (75-25) 100 UNIT/ML Kwikpen Inject 10 Units into the skin 2 (two) times daily with a meal. 45 mL 0   irbesartan (AVAPRO) 300 MG tablet Take 1 tablet (300 mg total) by mouth daily. 90 tablet 1   potassium chloride SA (KLOR-CON) 20 MEQ tablet Take 1 tablet (20 mEq total) by mouth daily. With furosemide 90 tablet 1   pravastatin (PRAVACHOL) 80 MG tablet Take 1 tablet (80 mg total) by mouth every evening. 90 tablet 1   TRUEplus Lancets 33G MISC 1 each by Does not apply route 3 (three) times daily as needed. 100 each 3   TYLENOL 325 MG tablet      acyclovir (ZOVIRAX) 400 MG tablet Take 400 mg by mouth 2 (two) times daily. (Patient not taking: Reported on 10/29/2021)     No current facility-administered medications for this visit.     Allergies:   Patient has no known allergies.    Social History:  The patient  reports that she has never smoked. She has never used smokeless tobacco. She reports that she does not drink alcohol and does not use drugs.   Family History:  The patient's family history includes Heart attack in her mother; Hypertension in her mother.    ROS:  Please see the history of present illness.   Otherwise, review of systems are positive for none.   All other systems are reviewed and negative.    PHYSICAL EXAM: VS:  BP 130/60 (BP Location: Right Arm, Patient Position: Sitting, Cuff Size: Large)    Pulse 66    Ht $R'5\' 10"'Fl$  (1.778 m)    Wt (!) 346 lb 4 oz (157.1 kg)    SpO2  98%    BMI 49.68 kg/m  , BMI Body mass index is 49.68 kg/m. GEN: Well nourished, well developed, in no acute distress  HEENT: normal  Neck: no JVD, carotid bruits, or masses Cardiac: Irregularly irregular; no rubs, or gallops.  No murmurs today.  Trace bilateral leg edema. Respiratory:  clear to auscultation bilaterally, normal work of breathing GI: soft, nontender, nondistended, + BS MS: no deformity or atrophy  Skin: warm and dry, no rash Neuro:  Strength and sensation are intact Psych: euthymic mood, full affect   EKG:  EKG is ordered today. The ekg ordered today demonstrates atrial fibrillation with ventricular rate of 66 bpm.  Possible old inferior infarct.   Recent Labs: 07/14/2021: ALT 6; BUN 31; Creat 1.16; Hemoglobin 10.8; Platelets 228; Potassium 4.8; Sodium 138    Lipid Panel    Component Value Date/Time   CHOL 134 07/14/2021 0901   CHOL 139 04/16/2015 1138   TRIG 78 07/14/2021 0901   HDL 51 07/14/2021 0901   HDL 52 04/16/2015 1138   CHOLHDL 2.6 07/14/2021 0901   VLDL 19 12/23/2016 0941   LDLCALC 67 07/14/2021 0901      Wt Readings from Last 3 Encounters:  10/29/21 (!) 346 lb 4 oz (157.1 kg)  09/29/21 (!) 350 lb (158.8 kg)  09/15/21 (!) 352 lb 11.8 oz (160 kg)       No flowsheet data  found.    ASSESSMENT AND PLAN:  1.  Chronic atrial fibrillation:  Chads Vasc score is 5 .  Continue anticoagulation with Eliquis.  I reviewed most recent labs done in February which showed stable creatinine of 1.44.  Ventricular rate is well controlled on current dose of diltiazem.  2. Chronic diastolic heart failure: She uses furosemide as needed and appears to be euvolemic.  3. Essential hypertension: Blood pressure is well controlled on current medications.  4.  Obstructive sleep apnea: She uses CPAP on a regular basis.   Disposition:   FU with me in 12 months  Signed,  Kathlyn Sacramento, MD  10/29/2021 8:31 AM    [place]

## 2021-10-29 ENCOUNTER — Encounter: Payer: Self-pay | Admitting: Cardiovascular Disease

## 2021-10-29 ENCOUNTER — Other Ambulatory Visit: Payer: Self-pay

## 2021-10-29 ENCOUNTER — Ambulatory Visit (INDEPENDENT_AMBULATORY_CARE_PROVIDER_SITE_OTHER): Payer: Medicare PPO | Admitting: Cardiovascular Disease

## 2021-10-29 VITALS — BP 130/60 | HR 66 | Ht 70.0 in | Wt 346.2 lb

## 2021-10-29 DIAGNOSIS — I110 Hypertensive heart disease with heart failure: Secondary | ICD-10-CM | POA: Diagnosis not present

## 2021-10-29 DIAGNOSIS — I10 Essential (primary) hypertension: Secondary | ICD-10-CM | POA: Diagnosis not present

## 2021-10-29 DIAGNOSIS — I4821 Permanent atrial fibrillation: Secondary | ICD-10-CM | POA: Diagnosis not present

## 2021-10-29 NOTE — Patient Instructions (Signed)
Medication Instructions:  ?- Your physician recommends that you continue on your current medications as directed. Please refer to the Current Medication list given to you today. ? ?*If you need a refill on your cardiac medications before your next appointment, please call your pharmacy* ? ? ?Lab Work: ?- none ordered ? ?If you have labs (blood work) drawn today and your tests are completely normal, you will receive your results only by: ?MyChart Message (if you have MyChart) OR ?A paper copy in the mail ?If you have any lab test that is abnormal or we need to change your treatment, we will call you to review the results. ? ? ?Testing/Procedures: ?- none ordered ? ? ?Follow-Up: ?At [facility], you and your health needs are our priority.  As part of our continuing mission to provide you with exceptional heart care, we have created designated Provider Care Teams.  These Care Teams include your primary Cardiologist (physician) and Advanced Practice Providers (APPs -  Physician Assistants and Nurse Practitioners) who all work together to provide you with the care you need, when you need it. ? ?We recommend signing up for the patient portal called "MyChart".  Sign up information is provided on this After Visit Summary.  MyChart is used to connect with patients for Virtual Visits (Telemedicine).  Patients are able to view lab/test results, encounter notes, upcoming appointments, etc.  Non-urgent messages can be sent to your provider as well.   ?To learn more about what you can do with MyChart, go to [email or web address].   ? ?Your next appointment:   ?1 year(s) ? ?The format for your next appointment:   ?In Person ? ?Provider:   ?You may see Kathlyn Sacramento, MD or one of the following Advanced Practice Providers on your designated Care Team:   ?Murray Hodgkins, NP ?Christell Faith, PA-C ?Cadence Kathlen Mody, PA-C  ? ? ?Other Instructions ?N/a ? ?

## 2021-11-10 NOTE — Progress Notes (Signed)
Name: Amber Reyes   MRN: [id number]    DOB: [birth year]   Date:11/11/2021 ? ?     Progress Note ? ?Subjective ? ?Chief Complaint ? ?Follow Up ? ?HPI ? ?DMII controlled: She has been compliant with medication, she is on  Humalog 75/25 since  Feb 2018, also started on Trulicity in 6256 LSLH7D was 6.5%  6.7% ,6.9%, 6.8 % the last two times it was 7 %, we will send her to the lab today. Taking Avapro for microalbuminuria and CKI stage III ,  she is on statin for dyslipidemia, she has diabetes associated with obesity. FSB at home has been between low 100'sl ately.  No polyphagia, polydipsia or polyuria. Weight is stable since last visit. Discussed SGL-2 agonist, she states cannot afford branded medications, we will reach out to [name] ( pharmacist) also would like to increase dose of Trulicity to help her lose more weight and try to decrease dose of insulin.  ?  ?HTN/CHF/Afib/Pulmonary hypertension : taking medications and denies side effects of medications, bp today is at goal, bp has been well controlled at home now 120's-130's most of the time, occasionally goes up to 140's ( she brought a log with her)   She denies chest pain no recent palpitation, found to have Afib on EKG done by Dr. Fletcher Anon and takes Cardizem for rate control   She has orthopnea - uses two pillows , SOB is stable.  Echo done 06/2018 LV function is normal and unable to assess diastolic dysfunction, severe left  atrium dilation , mild elevation of Pulmonary pressure, also has aorta dilation  She takes Eliquis and has been compliant with medication. Last visit with Dr. Fletcher Anon was 10/2021  ?  ?Hyperlipidemia: taking Pravastatin, we will continue current medication , reviewed labs done 11/22 and LDL was at goal at 67.  ? ?RLS: symptoms are still present, described as soreness on both legs at night, improves when she rubs her legs with icy hot.  ?  ?CKIII: she is going to see nephrologist at [facility], last labs reviewed still stage III CKI , GFR stable,  last level done 02/23 GFR dropped to 38 , she denies  pruritis, she has good urine output, she is not taking potassium because she had hyperkalemia - but avoiding lasix, advised to take one potassium daily when she takes lasix  , she has secondary hyperparathyroidism - last level 107  back in 11/22  ?  ?OSA: she was wearing CPAP machine every night, wakes up feeling rested, but has dry mouth, she has humidifier  . Unchanged  ? ?Morbid Obesity: she gets Trulicity through [employer] she initially lost a lot of weight but now is stable,   she is trying to avoid carbohydrates, eating more vegetables , she has not been really physically active  ?  ?Asthma Moderate persistent/Chronic bronchitis: . She is compliant and uses Advair twice daily as prescribed.  No wheezing , morning cough but is dry, she has stable SOB with activity  ?  ?Atherosclerosis of aorta and echo showed mild dilation of ascending aorta, continue statin and eliquis. Unchanged  ? ?Chronic back pain: she cannot stand or walk for a long time because increases the pain, dull pain, no radiculitis. She states Tylenol controls symptoms . Reminded her not to take NSAID's ? ? ?Patient Active Problem List  ? Diagnosis Date Noted  ? Localized, 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/22/2021  ? Pain in joint of left shoulder 04/22/2021  ?  Tendonitis of shoulder, left 04/22/2021  ? Hypertension associated with stage 3 chronic kidney disease due to type 2 diabetes mellitus ([place]) 02/06/2020  ? History of colonic polyps   ? Polyp of colon   ? Thoracic aortic atherosclerosis ([place]) 12/30/2016  ? Controlled type 2 diabetes mellitus with stage 3 chronic kidney disease ([place]) 12/23/2016  ? Venous insufficiency 05/21/2016  ? Hypertensive heart disease 05/21/2016  ? Anemia in chronic kidney disease 12/22/2015  ? Pulmonary hypertension ([place]) 12/18/2015  ? Xanthelasma 04/15/2015  ? Anxiety 04/12/2015  ? Chronic kidney disease (CKD), stage III (moderate) ([place])  04/12/2015  ? Edema extremities 04/12/2015  ? Disc disorder of lumbar region 04/12/2015  ? Asthma, moderate persistent 04/12/2015  ? Morbid obesity, unspecified obesity type ([place]) 04/12/2015  ? Obstructive apnea 04/12/2015  ? Restless leg 04/12/2015  ? Allergic rhinitis 04/12/2015  ? Vitamin D deficiency 04/12/2015  ? Controlled gout 04/12/2015  ? Premature atrial contractions 11/29/2013  ? Atrial fibrillation ([place]) 11/09/2013  ? Benign essential hypertension   ? Hyperlipidemia   ? ? ?Past Surgical History:  ?Procedure Laterality Date  ? CARDIAC CATHETERIZATION  2002  ? [facility]  ? CATARACT EXTRACTION W/PHACO Right 09/15/2021  ? Procedure: CATARACT EXTRACTION PHACO AND INTRAOCULAR LENS PLACEMENT (IOC) RIGHT 10.19 01:01.8;  Surgeon: Birder Robson, MD;  Location: [facility];  Service: Ophthalmology;  Laterality: Right;  ? CATARACT EXTRACTION W/PHACO Left 09/29/2021  ? Procedure: CATARACT EXTRACTION PHACO AND INTRAOCULAR LENS PLACEMENT (IOC) LEFT DIABETIC 11.40 01:21.2;  Surgeon: Birder Robson, MD;  Location: [facility];  Service: Ophthalmology;  Laterality: Left;  ? COLONOSCOPY WITH PROPOFOL N/A 09/25/2018  ? Procedure: COLONOSCOPY WITH PROPOFOL;  Surgeon: Jonathon Bellows, MD;  Location[facility] ENDOSCOPY;  Service: Gastroenterology;  Laterality: N/A;  ? COLONOSCOPY WITH PROPOFOL N/A 04/13/2019  ? Procedure: COLONOSCOPY WITH PROPOFOL;  Surgeon: Jonathon Bellows, MD;  Location: [facility] ENDOSCOPY;  Service: Gastroenterology;  Laterality: N/A;  ? COLONOSCOPY WITH PROPOFOL N/A 06/27/2020  ? Procedure: COLONOSCOPY WITH PROPOFOL;  Surgeon: Jonathon Bellows, MD;  Location: [facility] ENDOSCOPY;  Service: Gastroenterology;  Laterality: N/A;  ? PERICARDIUM SURGERY    ? ? ?Family History  ?Problem Relation Age of Onset  ? Heart attack Mother   ? Hypertension Mother   ? Breast cancer Neg Hx   ? ? ?Social History  ? ?Tobacco Use  ? Smoking status: Never  ? Smokeless tobacco: Never  ? Tobacco comments:  ?  n/a  ?Substance Use Topics  ?  Alcohol use: No  ?  Alcohol/week: 0.0 standard drinks  ? ? ? ?Current Outpatient Medications:  ?  acetaminophen (TYLENOL) 650 MG CR tablet, Take 650 mg by mouth in the morning and at bedtime., Disp: , Rfl:  ?  albuterol (PROVENTIL HFA;VENTOLIN HFA) 108 (90 BASE) MCG/ACT inhaler, Inhale 2 puffs into the lungs every 6 (six) hours as needed for wheezing or shortness of breath., Disp: , Rfl:  ?  apixaban (ELIQUIS) 5 MG TABS tablet, Take 1 tablet (5 mg total) by mouth 2 (two) times daily., Disp: 60 tablet, Rfl: 0 ?  Blood Glucose Calibration (TRUE METRIX LEVEL 1) Low SOLN, 1 each by In Vitro route once a week., Disp: 1 each, Rfl: 1 ?  Blood Glucose Monitoring Suppl (TRUE METRIX AIR GLUCOSE METER) w/Device KIT, Inject 1 each into the skin 4 (four) times daily as needed. Check fsbs 4 times daily E11.22, Disp: 1 kit, Rfl: 0 ?  Cholecalciferol (VITAMIN D) 2000 UNITS tablet, Take 2,000 Units by mouth daily.,  Disp: , Rfl:  ?  colchicine (COLCRYS) 0.6 MG tablet, Take 1-2 tablets (0.6-1.2 mg total) by mouth daily as needed., Disp: 30 tablet, Rfl: 0 ?  diltiazem (CARDIZEM CD) 240 MG 24 hr capsule, TAKE 1 CAPSULE EVERY DAY, Disp: 90 capsule, Rfl: 0 ?  DROPLET PEN NEEDLES 30G X 8 MM MISC, USE TWICE DAILY WITH INSULIN, Disp: 200 each, Rfl: 3 ?  Dulaglutide (TRULICITY) 1.5 ON/6.2XB SOPN, Inject 1.5 mg into the skin once a week., Disp: 6 mL, Rfl: 3 ?  Elastic Bandages & Supports (MEDICAL COMPRESSION STOCKINGS) MISC, 2 each by Does not apply route daily., Disp: 2 each, Rfl: 2 ?  fluticasone (FLONASE) 50 MCG/ACT nasal spray, Place 2 sprays into both nostrils daily., Disp: 48 g, Rfl: 2 ?  furosemide (LASIX) 40 MG tablet, TAKE 1 TABLET (40 MG TOTAL) BY MOUTH 2 (TWO) TIMES DAILY AS NEEDED., Disp: 180 tablet, Rfl: 1 ?  glucose blood (TRUE METRIX BLOOD GLUCOSE TEST) test strip, CHECK BLOOD SUGAR FOUR TIMES DAILY, Disp: 400 strip, Rfl: 0 ?  Insulin Lispro Prot & Lispro (HUMALOG MIX 75/25 KWIKPEN) (75-25) 100 UNIT/ML Kwikpen, Inject 10 Units  into the skin 2 (two) times daily with a meal., Disp: 45 mL, Rfl: 0 ?  potassium chloride SA (KLOR-CON) 20 MEQ tablet, Take 1 tablet (20 mEq total) by mouth daily. With furosemide, Disp: 90 tablet, Rfl: 1 ?  TRUE

## 2021-11-11 ENCOUNTER — Ambulatory Visit (INDEPENDENT_AMBULATORY_CARE_PROVIDER_SITE_OTHER): Payer: Medicare PPO | Admitting: Family Medicine

## 2021-11-11 ENCOUNTER — Encounter: Payer: Self-pay | Admitting: Family Medicine

## 2021-11-11 VITALS — BP 138/82 | HR 91 | Resp 16 | Ht 70.0 in | Wt 353.0 lb

## 2021-11-11 DIAGNOSIS — E1169 Type 2 diabetes mellitus with other specified complication: Secondary | ICD-10-CM

## 2021-11-11 DIAGNOSIS — I482 Chronic atrial fibrillation, unspecified: Secondary | ICD-10-CM

## 2021-11-11 DIAGNOSIS — I700 Atherosclerosis of aorta: Secondary | ICD-10-CM

## 2021-11-11 DIAGNOSIS — E785 Hyperlipidemia, unspecified: Secondary | ICD-10-CM

## 2021-11-11 DIAGNOSIS — I272 Pulmonary hypertension, unspecified: Secondary | ICD-10-CM

## 2021-11-11 DIAGNOSIS — E1122 Type 2 diabetes mellitus with diabetic chronic kidney disease: Secondary | ICD-10-CM | POA: Diagnosis not present

## 2021-11-11 DIAGNOSIS — N1831 Chronic kidney disease, stage 3a: Secondary | ICD-10-CM | POA: Diagnosis not present

## 2021-11-11 DIAGNOSIS — N2581 Secondary hyperparathyroidism of renal origin: Secondary | ICD-10-CM

## 2021-11-11 DIAGNOSIS — J4540 Moderate persistent asthma, uncomplicated: Secondary | ICD-10-CM

## 2021-11-11 DIAGNOSIS — Z794 Long term (current) use of insulin: Secondary | ICD-10-CM | POA: Diagnosis not present

## 2021-11-11 DIAGNOSIS — I77819 Aortic ectasia, unspecified site: Secondary | ICD-10-CM | POA: Diagnosis not present

## 2021-11-11 DIAGNOSIS — I10 Essential (primary) hypertension: Secondary | ICD-10-CM

## 2021-11-11 DIAGNOSIS — N183 Chronic kidney disease, stage 3 unspecified: Secondary | ICD-10-CM

## 2021-11-11 DIAGNOSIS — I110 Hypertensive heart disease with heart failure: Secondary | ICD-10-CM

## 2021-11-11 DIAGNOSIS — D631 Anemia in chronic kidney disease: Secondary | ICD-10-CM

## 2021-11-11 DIAGNOSIS — Z23 Encounter for immunization: Secondary | ICD-10-CM

## 2021-11-11 DIAGNOSIS — G4733 Obstructive sleep apnea (adult) (pediatric): Secondary | ICD-10-CM

## 2021-11-11 DIAGNOSIS — N1832 Chronic kidney disease, stage 3b: Secondary | ICD-10-CM

## 2021-11-11 DIAGNOSIS — M109 Gout, unspecified: Secondary | ICD-10-CM

## 2021-11-11 MED ORDER — IRBESARTAN 300 MG PO TABS: 300.0000 mg | ORAL_TABLET | Freq: Every day | ORAL | 1 refills | Status: DC

## 2021-11-11 MED ORDER — FLUTICASONE-SALMETEROL 250-50 MCG/ACT IN AEPB: INHALATION_SPRAY | RESPIRATORY_TRACT | 1 refills | Status: DC

## 2021-11-11 MED ORDER — SHINGRIX 50 MCG/0.5ML IM SUSR: 0.5000 mL | Freq: Once | INTRAMUSCULAR | 1 refills | Status: AC

## 2021-11-11 MED ORDER — ALLOPURINOL 100 MG PO TABS: 100.0000 mg | ORAL_TABLET | Freq: Two times a day (BID) | ORAL | 1 refills | Status: DC

## 2021-11-11 MED ORDER — PRAVASTATIN SODIUM 80 MG PO TABS: 80.0000 mg | ORAL_TABLET | Freq: Every evening | ORAL | 1 refills | Status: DC

## 2021-11-12 LAB — COMPLETE METABOLIC PANEL WITH GFR
AG Ratio: 1.3 (calc) (ref 1.0–2.5)
ALT: 6 U/L (ref 6–29)
AST: 15 U/L (ref 10–35)
Albumin: 3.8 g/dL (ref 3.6–5.1)
Alkaline phosphatase (APISO): 83 U/L (ref 37–153)
BUN/Creatinine Ratio: 29 (calc) — ABNORMAL HIGH (ref 6–22)
BUN: 38 mg/dL — ABNORMAL HIGH (ref 7–25)
CO2: 25 mmol/L (ref 20–32)
Calcium: 8.9 mg/dL (ref 8.6–10.4)
Chloride: 105 mmol/L (ref 98–110)
Creat: 1.31 mg/dL — ABNORMAL HIGH (ref 0.60–1.00)
Globulin: 2.9 g/dL (calc) (ref 1.9–3.7)
Glucose, Bld: 164 mg/dL — ABNORMAL HIGH (ref 65–99)
Potassium: 5 mmol/L (ref 3.5–5.3)
Sodium: 139 mmol/L (ref 135–146)
Total Bilirubin: 0.4 mg/dL (ref 0.2–1.2)
Total Protein: 6.7 g/dL (ref 6.1–8.1)
eGFR: 43 mL/min/{1.73_m2} — ABNORMAL LOW (ref >=60)

## 2021-11-12 LAB — HEMOGLOBIN A1C
Hgb A1c MFr Bld: 7.6 % of total Hgb — ABNORMAL HIGH (ref <5.7)
Mean Plasma Glucose: 171 mg/dL
eAG (mmol/L): 9.5 mmol/L

## 2021-12-01 ENCOUNTER — Ambulatory Visit
Admission: RE | Admit: 2021-12-01 | Discharge: 2021-12-01 | Disposition: A | Discharge status: Home / Self Care | Payer: Medicare PPO | Source: Ambulatory Visit | Attending: Family Medicine | Admitting: Family Medicine

## 2021-12-01 ENCOUNTER — Telehealth: Payer: Self-pay

## 2021-12-01 DIAGNOSIS — Z1231 Encounter for screening mammogram for malignant neoplasm of breast: Secondary | ICD-10-CM | POA: Diagnosis not present

## 2021-12-01 NOTE — Progress Notes (Signed)
? ? ?  Chronic Care Management ?Pharmacy Assistant  ? ?Name: Amber Reyes  MRN: [id number] DOB: 1946-04-06 ? ?Patient called to be reminded of her telephone appointment with [name], CPP on 12/02/2021 @ 0915 ? ?No answer, left message of appointment date, time and type of appointment (either telephone or in person). Left message to have all medications, supplements, blood pressure and/or blood sugar logs available during appointment and to return call if need to reschedule. ? ?Star Rating Drug: ?Irbesartan 300 mg last filled on 11/22/2021 for a 90-Day supply with [facility] ?Pravastatin 80 mg last filled on 11/03/2021 for a 90-Day supply with [facility] ?Trulicity 1.5 mg patient receives this medication through [employer] Patient Assistance Program ? ?Any gaps in medications fill history? None ? ?Care Gaps: ?Zoster Vaccines ?[name] ?Diabetic Foot Exam ?Mammogram ? ?[name], CPA/CMA ?Clinical Pharmacist Assistant ?Phone: [phone]  ? ?

## 2021-12-02 ENCOUNTER — Ambulatory Visit (INDEPENDENT_AMBULATORY_CARE_PROVIDER_SITE_OTHER): Payer: Medicare PPO

## 2021-12-02 DIAGNOSIS — E785 Hyperlipidemia, unspecified: Secondary | ICD-10-CM

## 2021-12-02 DIAGNOSIS — N183 Chronic kidney disease, stage 3 unspecified: Secondary | ICD-10-CM

## 2021-12-02 NOTE — Progress Notes (Signed)
? ?Chronic Care Management ?Pharmacy Note ? ?12/18/2021 ?Name:  Amber Reyes MRN:  [id number] DOB:  Sep 14, 1945 ? ?Summary: ?Patient presents for CCM follow-up. Home blood pressures have been elevated. Patient is candidate for SGLT2-inhibitor given CKD, macroalbuminuria.  ? ?Recommendations/Changes made from today's visit: ?-Recommend Farxiga 10 mg daily at next follow-up for CKD / heart failure protection ?-Recommend decreasing Insulin to 5 units twice daily   ? ?Plan: ?CPP follow-up in 3 months ? ?Subjective: ?Amber Reyes is an 76 y.o. year old female who is a primary patient of Sowles, Drue Stager, MD.  The CCM team was consulted for assistance with disease management and care coordination needs.   ? ?Engaged with patient by telephone for follow up visit in response to provider referral for pharmacy case management and/or care coordination services.  ? ?Consent to Services:  ?The patient was given information about Chronic Care Management services, agreed to services, and gave verbal consent prior to initiation of services.  Please see initial visit note for detailed documentation.  ? ?Patient Care Team: ?Steele Sizer, MD as PCP - General (Family Medicine) ?Wellington Hampshire, MD as Consulting Physician (Cardiology) ?Germaine Pomfret, [facility] as Pharmacist (Pharmacist) ? ?Recent office visits: ?03/09/21: Patient presented to Dr. Ancil Boozer for follow-up. A1c 7.0%. ?11/19/20: Patient presented to Dr. Ancil Boozer for follow-up. A1c 6.8%. BP 144/70. Humalog 75/25 10 units twice daily  ?06/19/20: Patient presented to Clemetine Marker, LPN for AWV.  ? ?Recent consult visits: ?04/21/21: Dr. Fletcher Anon decreased Diltiazem to 240 mg daily due to bradycardia.  ?11/20/20: Patient presented to Dr. Fletcher Anon (Cardiology) for follow-up. BP 150/58. No medication changes made.  ?11/12/20: Patient presented to Sherlynn Stalls, ANP (Nephrology) for follow-up. BP 175/73.  ? ?Hospital visits: ?None in previous 6 months ? ?Objective: ? ?Lab Results   ?Component Value Date  ? CREATININE 1.31 (H) 11/11/2021  ? BUN 38 (H) 11/11/2021  ? GFRNONAA 41 (L) 05/12/2020  ? GFRAA 48 (L) 05/12/2020  ? NA 139 11/11/2021  ? K 5.0 11/11/2021  ? CALCIUM 8.9 11/11/2021  ? CO2 25 11/11/2021  ? ? ?Lab Results  ?Component Value Date/Time  ? HGBA1C 7.6 (H) 11/11/2021 09:37 AM  ? HGBA1C 7.0 (A) 07/14/2021 08:18 AM  ? HGBA1C 7.0 (A) 03/09/2021 08:03 AM  ? HGBA1C 6.8 (H) 05/14/2019 12:00 AM  ? HGBA1C 6.5 01/01/2019 07:48 AM  ? HGBA1C 6.6 08/30/2018 07:46 AM  ? HGBA1C 6.6 (A) 08/30/2018 07:46 AM  ? HGBA1C 6.6 08/30/2018 07:46 AM  ? MICROALBUR 31.9 07/14/2021 09:01 AM  ? MICROALBUR 5.4 05/14/2019 12:00 AM  ? MICROALBUR 20 08/15/2015 08:12 AM  ?  ?Last diabetic Eye exam:  ?Lab Results  ?Component Value Date/Time  ? HMDIABEYEEXA No Retinopathy 08/19/2021 12:00 AM  ?  ?Last diabetic Foot exam: No results found for: HMDIABFOOTEX  ? ?Lab Results  ?Component Value Date  ? CHOL 134 07/14/2021  ? HDL 51 07/14/2021  ? [place] 67 07/14/2021  ? TRIG 78 07/14/2021  ? CHOLHDL 2.6 07/14/2021  ? ? ? ?  Latest Ref Rng & Units 11/11/2021  ?  9:37 AM 07/14/2021  ?  9:01 AM 05/12/2020  ?  8:59 AM  ?Hepatic Function  ?Total Protein 6.1 - 8.1 g/dL 6.7   6.6   6.8    ?AST 10 - 35 U/L '15   13   15    ' ?ALT 6 - 29 U/L '6   6   8    ' ?Total Bilirubin 0.2 - 1.2 mg/dL 0.4  0.5   0.5    ? ? ?No results found for: TSH, FREET4 ? ? ?  Latest Ref Rng & Units 07/14/2021  ?  9:01 AM 11/19/2020  ?  8:26 AM 05/12/2020  ?  8:59 AM  ?CBC  ?WBC 3.8 - 10.8 Thousand/uL 8.9   7.6   8.6    ?Hemoglobin 11.7 - 15.5 g/dL 10.8   10.2   10.3    ?Hematocrit 35.0 - 45.0 % 33.4   31.9   31.7    ?Platelets 140 - 400 Thousand/uL 228   225   229    ? ? ?Lab Results  ?Component Value Date/Time  ? VD25OH 45 05/12/2020 08:59 AM  ? VD25OH 48 05/14/2019 12:00 AM  ? ? ?Clinical ASCVD: Yes  ?The 10-year ASCVD risk score (Arnett DK, et al., 2019) is: 24% ?  Values used to calculate the score: ?    Age: 77 years ?    Sex: Female ?    Is Non-Hispanic  African American: Yes ?    Diabetic: Yes ?    Tobacco smoker: No ?    Systolic Blood Pressure: 578 mmHg ?    Is BP treated: Yes ?    HDL Cholesterol: 51 mg/dL ?    Total Cholesterol: 134 mg/dL   ? ? ?  11/11/2021  ?  8:16 AM 07/14/2021  ?  8:04 AM 06/23/2021  ?  8:24 AM  ?Depression screen PHQ 2/9  ?Decreased Interest 0 0 0  ?Down, Depressed, Hopeless 0 0 0  ?PHQ - 2 Score 0 0 0  ?Altered sleeping 0 0   ?Tired, decreased energy 0 0   ?Change in appetite 0 0   ?Feeling bad or failure about yourself  0 0   ?Trouble concentrating 0 0   ?Moving slowly or fidgety/restless 0 0   ?Suicidal thoughts 0 0   ?PHQ-9 Score 0 0   ?  ?Social History  ? ?Tobacco Use  ?Smoking Status Never  ?Smokeless Tobacco Never  ?Tobacco Comments  ? n/a  ? ?BP Readings from Last 3 Encounters:  ?11/11/21 138/82  ?10/29/21 130/60  ?09/29/21 116/65  ? ?Pulse Readings from Last 3 Encounters:  ?11/11/21 91  ?10/29/21 66  ?09/29/21 (!) 50  ? ?Wt Readings from Last 3 Encounters:  ?11/11/21 (!) 353 lb (160.1 kg)  ?10/29/21 (!) 346 lb 4 oz (157.1 kg)  ?09/29/21 (!) 350 lb (158.8 kg)  ? ? ?Assessment/Interventions: Review of patient past medical history, allergies, medications, health status, including review of consultants reports, laboratory and other test data, was performed as part of comprehensive evaluation and provision of chronic care management services.  ? ?SDOH:  (Social Determinants of Health) assessments and interventions performed: Yes ? ? ? ? ?[place] ? ?No Known Allergies ? ?Medications Reviewed Today   ? ? Reviewed by Steele Sizer, MD (Physician) on 11/11/21 at 0900  Med List Status: <None>  ? ?Medication Order Taking? Sig Documenting Provider Last Dose Status Informant  ?acetaminophen (TYLENOL) 650 MG CR tablet [id number] Yes Take 650 mg by mouth in the morning and at bedtime. [provider] Taking Active   ?Discontinued 11/11/21 0900 (Completed Course)   ?albuterol (PROVENTIL HFA;VENTOLIN HFA) 108 (90 BASE) MCG/ACT  inhaler [id number] Yes Inhale 2 puffs into the lungs every 6 (six) hours as needed for wheezing or shortness of breath. [provider] Taking Active Self  ?allopurinol (ZYLOPRIM) 100 MG tablet [id number] Yes Take 1 tablet (100 mg total)  by mouth 2 (two) times daily. Steele Sizer, MD Taking Active   ?apixaban (ELIQUIS) 5 MG TABS tablet [id number] Yes Take 1 tablet (5 mg total) by mouth 2 (two) times daily. Wellington Hampshire, MD Taking Active   ?Blood Glucose Calibration (TRUE METRIX LEVEL 1) Low SOLN [id number] Yes 1 each by In Vitro route once a week. Steele Sizer, MD Taking Active   ?Blood Glucose Monitoring Suppl (TRUE METRIX AIR GLUCOSE METER) w/Device KIT [id number] Yes Inject 1 each into the skin 4 (four) times daily as needed. Check fsbs 4 times daily E11.22 Steele Sizer, MD Taking Active   ?Cholecalciferol (VITAMIN D) 2000 UNITS tablet [id number] Yes Take 2,000 Units by mouth daily. [provider] Taking Active Self  ?colchicine (COLCRYS) 0.6 MG tablet [id number] Yes Take 1-2 tablets (0.6-1.2 mg total) by mouth daily as needed. Steele Sizer, MD Taking Active   ?diltiazem (CARDIZEM CD) 240 MG 24 hr capsule [id number] Yes TAKE 1 CAPSULE EVERY DAY Wellington Hampshire, MD Taking Active   ?DROPLET PEN NEEDLES 30G X 8 MM MISC [id number] Yes USE TWICE DAILY WITH INSULIN Steele Sizer, MD Taking Active   ?Dulaglutide (TRULICITY) 1.5 VQ/2.5ZD SOPN [id number] Yes Inject 1.5 mg into the skin once a week. Steele Sizer, MD Taking Active   ?Elastic Bandages & Supports ([place]) [place] [id number] Yes 2 each by Does not apply route daily. Steele Sizer, MD Taking Active Self  ?fluticasone (FLONASE) 50 MCG/ACT nasal spray [id number] Yes Place 2 sprays into both nostrils daily. Steele Sizer, MD Taking Active   ?fluticasone-salmeterol (ADVAIR) 250-50 MCG/ACT AEPB [id number] Yes INHALE 1 PUFF BY MOUTH TWICE DAILY Sowles, Drue Stager, MD Taking Active   ?furosemide (LASIX) 40 MG tablet  [id number] Yes TAKE 1 TABLET (40 MG TOTAL) BY MOUTH 2 (TWO) TIMES DAILY AS NEEDED. Steele Sizer, MD Taking Active   ?glucose blood (TRUE METRIX BLOOD GLUCOSE TEST) test strip [id number] Yes CHECK BLOOD SUGAR FOUR TIMES

## 2021-12-18 NOTE — Patient Instructions (Signed)
Visit Information ?It was great speaking with you today!  Please let me know if you have any questions about our visit. ? ? Goals Addressed   ? ?  ?  ?  ?  ? This Visit's Progress  ?  Monitor and Manage My Blood Sugar-Diabetes Type 2   On track  ?  Timeframe:  Long-Range Goal ?Priority:  High ?Start Date:  10/29/2020                           ?Expected End Date:  05/01/2022                    ? ?Follow Up within 90 days ?  ?-check blood sugar three times daily: Before breakfast, before supper, and at bedtime ?- check blood sugar if I feel it is too high or too low ?- enter blood sugar readings and medication or insulin into daily log  ?  ?Why is this important?   ?Checking your blood sugar at home helps to keep it from getting very high or very low.  ?Writing the results in a diary or log helps the doctor know how to care for you.  ?Your blood sugar log should have the time, date and the results.  ?Also, write down the amount of insulin or other medicine that you take.  ?Other information, like what you ate, exercise done and how you were feeling, will also be helpful.   ?  ?Notes:  ?  ?  Track and Manage My Blood Pressure-Hypertension   On track  ?  Timeframe:  Long-Range Goal ?Priority:  High ?Start Date:  12/17/2020                            ?Expected End Date:  06/18/2022                   ? ?Follow Up within 90 days ?  ?- check blood pressure 3 times per week  ?  ?Why is this important?   ?You won't feel high blood pressure, but it can still hurt your blood vessels.  ?High blood pressure can cause heart or kidney problems. It can also cause a stroke.  ?Making lifestyle changes like losing a little weight or eating less salt will help.  ?Checking your blood pressure at home and at different times of the day can help to control blood pressure.  ?If the doctor prescribes medicine remember to take it the way the doctor ordered.  ?Call the office if you cannot afford the medicine or if there are questions about it.   ?   ?Notes:  ?  ? ?  ? ? ?Patient Care Plan: General Pharmacy (Adult)  ?  ? ?Problem Identified: Hypertension, Hyperlipidemia, Diabetes, Atrial Fibrillation, Asthma, Chronic Kidney Disease and Gout   ?Priority: High  ?  ? ?Long-Range Goal: Patient-Specific Goal   ?Start Date: 10/29/2020  ?Expected End Date: 04/16/2022  ?This Visit's Progress: On track  ?Recent Progress: On track  ?Priority: High  ?Note:   ?Current Barriers:  ?Unable to independently afford treatment regimen ?Suboptimal therapeutic regimen for diabetes ? ?Pharmacist Clinical Goal(s):  ?Over the next 90 days, patient will maintain control of diabetes as evidenced by A1c less than 8%  through collaboration with PharmD and provider.  ? ?Interventions: ?1:1 collaboration with Steele Sizer, MD regarding development and update of comprehensive plan of  care as evidenced by provider attestation and co-signature ?Inter-disciplinary care team collaboration (see longitudinal plan of care) ?Comprehensive medication review performed; medication list updated in electronic medical record ? ?Hypertension (BP goal <130/80) ?-Controlled ?-Current treatment: ?Diltiazem CD 240 mg daily: Appropriate, Effective, Safe, Accessible  ?Furosemide 40 mg twice daily as needed: Appropriate, Effective, Query Safe ?Irbersartan 300 mg daily: Appropriate, Effective, Safe, Accessible  ?-Medications previously tried: NA  ?-Current home readings:   ? 136/58 ?127/50 ?129/71  ?118/53 ?142/66 ?-Current dietary habits: She has been eating less, trying to follow a low-carb, low-sodium diet.   ?-Query safe furosemide usage: Patient reports cramping on days when she takes the medication. ?-Current exercise habits: Unable to walk long distances.  ?-Denies hypotensive/hypertensive symptoms ?-Recommended to continue current medication ? ?Atrial Fibrillation (Goal: prevent stroke and major bleeding) ?-Controlled ?-CHADSVASC: 5 ?-Current treatment: ?Rate control: Diltiazem CD 240 mg daily   ?Anticoagulation: Eliquis 5 mg twice daily  ?-Medications previously tried: NA ?-Home BP and HR readings: NA  ?-Counseled on avoidance of NSAIDs due to increased bleeding risk with anticoagulants; ?-Recommended to continue current medication ? ?Hyperlipidemia: (LDL goal < 70) ?-Controlled ?-Current treatment: ?Pravastatin 80 mg daily  ?-Medications previously tried: NA  ?-Educated on Importance of limiting foods high in cholesterol; ?-Recommended to continue current medication ? ?Diabetes (A1c goal <8%) ?-Controlled ?-Current medications: ?Trulicity 1.5 mg weekly: Appropriate, Effective, Safe, Accessible   ?Humalog 75-25 10 units before breakfast, 10 units before supper: Appropriate, Effective, Safe, Accessible  ?-Medications previously tried: Metformin  ?-Current home glucose readings:  ? Fasting: 117,122, 125, 104, 109, 115, 95  ?-Denies  hypoglycemic symptoms ?-Patient at severe risk of worsening CKD due to macroalbuminuria. Discussed starting SGLT2- inhibitor with patient, she wanted to discuss with nephrology at her next follow-up. Asked patient ot inform me if she was amenable to starting the medication so we can apply for PAP.  ?-Continue current medications ? ?Asthma (Goal: control symptoms and prevent exacerbations) ?-Controlled ?-Current treatment  ?Proventil HFA 2 puffs every 6 hours as needed ?Advair 1 puff twice daily  ?-Medications previously tried: NA  ?-Exacerbations requiring treatment in last 6 months: None ?-Patient reports consistent use of maintenance inhaler ?-Frequency of rescue inhaler use: NA ?-Counseled on When to use rescue inhaler ?-Recommended to continue current medication ? ?Chronic Kidney Disease Stage 3a  ?-All medications assessed for renal dosing and appropriateness in chronic kidney disease. ?-Recommended to continue current medication ? ?Patient Goals/Self-Care Activities ?Over the next 90 days, patient will:  ?- check glucose 3 times daily, before breakfast, supper, and  bedtime, document, and provide at future appointments ?check blood pressure 2-3 times weekly, document, and provide at future appointments ? ?Follow Up Plan: Telephone follow up appointment with care management team member scheduled for:  03/16/2022 at 8:00 AM ?  ? ? ? ?Patient agreed to services and verbal consent obtained.  ? ?Patient verbalizes understanding of instructions and care plan provided today and agrees to view in [place]. Active MyChart status confirmed with patient.   ? ?Doristine Section, BCACP, CPP ?Clinical Pharmacist Practitioner  ?[facility] ?[phone] ? ?  ?

## 2021-12-20 DIAGNOSIS — E1122 Type 2 diabetes mellitus with diabetic chronic kidney disease: Secondary | ICD-10-CM | POA: Diagnosis not present

## 2021-12-20 DIAGNOSIS — N183 Chronic kidney disease, stage 3 unspecified: Secondary | ICD-10-CM | POA: Diagnosis not present

## 2021-12-20 DIAGNOSIS — Z794 Long term (current) use of insulin: Secondary | ICD-10-CM | POA: Diagnosis not present

## 2021-12-20 DIAGNOSIS — E785 Hyperlipidemia, unspecified: Secondary | ICD-10-CM

## 2022-01-05 ENCOUNTER — Telehealth: Payer: Self-pay

## 2022-01-05 NOTE — Progress Notes (Signed)
    Chronic Care Management Pharmacy Assistant   Name: Amber Reyes  MRN: [id number] DOB: [birth year]  Patient called to be reminded of her telephone appointment with [name], CPP on 01/06/2022 @ 0830.  Patient aware of appointment date, time, and type of appointment (either telephone or in person). Patient aware to have/bring all medications, supplements, blood pressure and/or blood sugar logs to visit.  Star Rating Drug: Irbesartan 300 mg last filled on 11/22/2021 for a 90-Day supply with [facility] Pravastatin 80 mg last filled on 11/03/2021 for a 90-Day supply with [facility] Trulicity 1.5 mg patient receives this medication through [facility]  Any gaps in medications fill history? No  Care Gaps: Zoster Vaccines [name] Diabetic Foot Exam  [name], CPA/CMA Clinical Pharmacist Assistant Phone: [phone]

## 2022-01-06 ENCOUNTER — Ambulatory Visit (INDEPENDENT_AMBULATORY_CARE_PROVIDER_SITE_OTHER): Payer: Medicare PPO

## 2022-01-06 ENCOUNTER — Telehealth: Payer: Self-pay

## 2022-01-06 DIAGNOSIS — N1832 Chronic kidney disease, stage 3b: Secondary | ICD-10-CM

## 2022-01-06 DIAGNOSIS — N183 Chronic kidney disease, stage 3 unspecified: Secondary | ICD-10-CM

## 2022-01-06 DIAGNOSIS — I10 Essential (primary) hypertension: Secondary | ICD-10-CM

## 2022-01-06 MED ORDER — DAPAGLIFLOZIN PROPANEDIOL 10 MG PO TABS: 10.0000 mg | ORAL_TABLET | Freq: Every day | ORAL | 0 refills | Status: DC

## 2022-01-06 MED ORDER — INSULIN LISPRO PROT & LISPRO (75-25 MIX) 100 UNIT/ML KWIKPEN: 6.0000 [IU] | PEN_INJECTOR | Freq: Two times a day (BID) | SUBCUTANEOUS | 0 refills | Status: DC

## 2022-01-06 NOTE — Progress Notes (Signed)
Chronic Care Management Pharmacy Assistant   Name: GENESSA BEMAN  MRN: [id number] DOB: 09-11-1945  Medication instructions per CPP  I recevied a task from [name], CPP regarding medication changes he would like for the patient to make.  Per CPP he would like her to:  Panhia Karl November 17, 2045, can you please let her know that Dr. Ancil Boozer and I were in agreement on the following plan:   -DECREASE Humalog 75/25 to 6 units twice daily before meals.   -START Farxiga 10 mg daily before breakfast. I can give her a voucher to take to the pharmacy for 30-days of the medication. Would she like to pick the voucher up at the clinic, or she can print it out herself at [email or web address]    Additionally, I would like her to stop by the clinic in two weeks to recheck her kidney function after starting the medication.   You can also start PAP for her Wilder Glade to see if she will be covered, doesn't have to be today though.    I spoke with the patient and informed her to decrease her Novolog 75/25 to 6 units daily before meals, and to start Farxiga 10 mg daily. The patient is agreeable to pick up the voucher for a 30-Day supply at the front desk of the clinic as CPP will print and leave it for her there. Patient also instructed 2 weeks after starting the Farxiga she is to go get her labs checked for her Kidney function as the order has been placed.   Patient is also agreeable to PAP being started online for the Farxiga 10 mg to be received through the AZ&ME patient assistance program.   Patient would like the 30-Day prescription sent to [facility] on [address], [place] [id number] CPP has been notified.   Patient Assistance Application for Wilder Glade started online at the [employer] website. The patient has been approved and her enrollment started today 01/06/2022 through 08/22/2022. Once prescription has been sent to the pharmacy for AZ&ME it will take about 14  business days for the patient to get her first shipment.  CPP notified and task created for him to send prescription over to AZ[employer]ME Medications: Outpatient Encounter Medications as of 01/06/2022  Medication Sig   acetaminophen (TYLENOL) 650 MG CR tablet Take 650 mg by mouth in the morning and at bedtime.   albuterol (PROVENTIL HFA;VENTOLIN HFA) 108 (90 BASE) MCG/ACT inhaler Inhale 2 puffs into the lungs every 6 (six) hours as needed for wheezing or shortness of breath.   allopurinol (ZYLOPRIM) 100 MG tablet Take 1 tablet (100 mg total) by mouth 2 (two) times daily.   apixaban (ELIQUIS) 5 MG TABS tablet Take 1 tablet (5 mg total) by mouth 2 (two) times daily.   Blood Glucose Calibration (TRUE METRIX LEVEL 1) Low SOLN 1 each by In Vitro route once a week.   Blood Glucose Monitoring Suppl (TRUE METRIX AIR GLUCOSE METER) w/Device KIT Inject 1 each into the skin 4 (four) times daily as needed. Check fsbs 4 times daily E11.22   Cholecalciferol (VITAMIN D) 2000 UNITS tablet Take 2,000 Units by mouth daily.   colchicine (COLCRYS) 0.6 MG tablet Take 1-2 tablets (0.6-1.2 mg total) by mouth daily as needed.   dapagliflozin propanediol (FARXIGA) 10 MG TABS tablet Take 1 tablet (10 mg total) by mouth daily before breakfast.   diltiazem (CARDIZEM CD) 240 MG 24 hr capsule TAKE 1 CAPSULE EVERY DAY   DROPLET PEN NEEDLES  30G X 8 MM MISC USE TWICE DAILY WITH INSULIN   Dulaglutide (TRULICITY) 1.5 QU/4.1HO SOPN Inject 1.5 mg into the skin once a week.   Elastic Bandages & Supports (MEDICAL COMPRESSION STOCKINGS) MISC 2 each by Does not apply route daily.   fluticasone (FLONASE) 50 MCG/ACT nasal spray Place 2 sprays into both nostrils daily.   fluticasone-salmeterol (ADVAIR) 250-50 MCG/ACT AEPB INHALE 1 PUFF BY MOUTH TWICE DAILY   furosemide (LASIX) 40 MG tablet TAKE 1 TABLET (40 MG TOTAL) BY MOUTH 2 (TWO) TIMES DAILY AS NEEDED.   glucose blood (TRUE METRIX BLOOD GLUCOSE TEST) test strip CHECK BLOOD SUGAR FOUR TIMES  DAILY   Insulin Lispro Prot & Lispro (HUMALOG MIX 75/25 KWIKPEN) (75-25) 100 UNIT/ML Kwikpen Inject 6 Units into the skin 2 (two) times daily with a meal.   irbesartan (AVAPRO) 300 MG tablet Take 1 tablet (300 mg total) by mouth daily.   potassium chloride SA (KLOR-CON) 20 MEQ tablet Take 1 tablet (20 mEq total) by mouth daily. With furosemide   pravastatin (PRAVACHOL) 80 MG tablet Take 1 tablet (80 mg total) by mouth every evening.   TRUEplus Lancets 33G MISC 1 each by Does not apply route 3 (three) times daily as needed.   TYLENOL 325 MG tablet    No facility-administered encounter medications on file as of 01/06/2022.    [name], CPA/CMA Clinical Pharmacist Assistant Phone: [phone]

## 2022-01-06 NOTE — Assessment & Plan Note (Signed)
-  DECREASE Humalog 75/25 to 6 units twice daily with meals. ?-START Farxiga 10 mg daily. ?-Recheck BMP in 2 weeks. ?

## 2022-01-06 NOTE — Patient Instructions (Signed)
Visit Information ?It was great speaking with you today!  Please let me know if you have any questions about our visit. ? ? Goals Addressed   ? ?  ?  ?  ?  ? This Visit's Progress  ?  Monitor and Manage My Blood Sugar-Diabetes Type 2   On track  ?  Timeframe:  Long-Range Goal ?Priority:  High ?Start Date:  10/29/2020                           ?Expected End Date:  05/01/2022                    ? ?Follow Up within 90 days ?  ?-check blood sugar three times daily: Before breakfast, before supper, and at bedtime ?- check blood sugar if I feel it is too high or too low ?- enter blood sugar readings and medication or insulin into daily log  ?  ?Why is this important?   ?Checking your blood sugar at home helps to keep it from getting very high or very low.  ?Writing the results in a diary or log helps the doctor know how to care for you.  ?Your blood sugar log should have the time, date and the results.  ?Also, write down the amount of insulin or other medicine that you take.  ?Other information, like what you ate, exercise done and how you were feeling, will also be helpful.   ?  ?Notes:  ?  ?  Track and Manage My Blood Pressure-Hypertension   On track  ?  Timeframe:  Long-Range Goal ?Priority:  High ?Start Date:  12/17/2020                            ?Expected End Date:  06/18/2022                   ? ?Follow Up within 90 days ?  ?- check blood pressure 3 times per week  ?  ?Why is this important?   ?You won't feel high blood pressure, but it can still hurt your blood vessels.  ?High blood pressure can cause heart or kidney problems. It can also cause a stroke.  ?Making lifestyle changes like losing a little weight or eating less salt will help.  ?Checking your blood pressure at home and at different times of the day can help to control blood pressure.  ?If the doctor prescribes medicine remember to take it the way the doctor ordered.  ?Call the office if you cannot afford the medicine or if there are questions about it.   ?   ?Notes:  ?  ? ?  ? ? ?Patient Care Plan: General Pharmacy (Adult)  ?  ? ?Problem Identified: Hypertension, Hyperlipidemia, Diabetes, Atrial Fibrillation, Asthma, Chronic Kidney Disease and Gout   ?Priority: High  ?  ? ?Long-Range Goal: Patient-Specific Goal   ?Start Date: 10/29/2020  ?Expected End Date: 04/16/2022  ?This Visit's Progress: On track  ?Recent Progress: On track  ?Priority: High  ?Note:   ?Current Barriers:  ?Unable to independently afford treatment regimen ?Suboptimal therapeutic regimen for diabetes ? ?Pharmacist Clinical Goal(s):  ?Over the next 90 days, patient will maintain control of diabetes as evidenced by A1c less than 8%  through collaboration with PharmD and provider.  ? ?Interventions: ?1:1 collaboration with Steele Sizer, MD regarding development and update of comprehensive plan of  care as evidenced by provider attestation and co-signature ?Inter-disciplinary care team collaboration (see longitudinal plan of care) ?Comprehensive medication review performed; medication list updated in electronic medical record ? ?Hypertension (BP goal <130/80) ?-Controlled ?-Current treatment: ?Diltiazem CD 240 mg daily: Appropriate, Effective, Safe, Accessible  ?Furosemide 40 mg twice daily as needed: Appropriate, Effective, Query Safe ?Irbersartan 300 mg daily: Appropriate, Effective, Safe, Accessible  ?-Medications previously tried: NA  ?-Current home readings:   ? 133/59 ? 136/49 ? 144/59 ? 139/66 ?-Current dietary habits: She has been eating less, trying to follow a low-carb, low-sodium diet.   ?-Query safe furosemide usage: Patient reports cramping on days when she takes the medication. ?-Current exercise habits: Unable to walk long distances.  ?-Denies hypotensive/hypertensive symptoms ?-Recommended to continue current medication ? ?Atrial Fibrillation (Goal: prevent stroke and major bleeding) ?-Controlled:  ?-CHADSVASC: 5 ?-Current treatment: ?Rate control: Diltiazem CD 240 mg daily   ?Anticoagulation: Eliquis 5 mg twice daily  ?-Medications previously tried: NA ?-Home BP and HR readings: NA  ?-Counseled on avoidance of NSAIDs due to increased bleeding risk with anticoagulants; ?-Recommended to continue current medication ? ?Hyperlipidemia: (LDL goal < 70) ?-Controlled ?-Current treatment: ?Pravastatin 80 mg daily  ?-Medications previously tried: NA  ?-Educated on Importance of limiting foods high in cholesterol; ?-Recommended to continue current medication ? ?Diabetes (A1c goal <8%) ?-Not ideally controlled ?-Current medications: ?Trulicity 1.5 mg weekly: Appropriate, Effective, Safe, Accessible   ?Humalog 75-25 10 units before breakfast, 10 units before supper: Appropriate, Effective, Safe, Accessible  ?-Medications previously tried: Metformin  ?-Current home glucose readings:  ? Fasting   ?17-May 116  ?16-May 110  ?15-May 100  ?14-May 118  ?13-May 120  ?12-May 118  ?11-May 101  ?10-May 94  ?9-May 104  ?8-May 113  ?Average 109  ? ?-Denies  hypoglycemic symptoms ?-Patient at severe risk of worsening CKD due to macroalbuminuria. Patient was amenable to starting SGLT-2 inhibitor to minimize risk.  ?After discussion with PCP will plan on following changes:  ?-DECREASE Humalog 75/25 to 6 units twice daily with meals  ?-START Farxiga 10 mg daily. Will provide patient with 30-day voucher and start on PAP.  ?-Recheck BMP in 2 weeks. ? ?Asthma (Goal: control symptoms and prevent exacerbations) ?-Controlled ?-Current treatment  ?Proventil HFA 2 puffs every 6 hours as needed ?Advair 1 puff twice daily  ?-Medications previously tried: NA  ?-Exacerbations requiring treatment in last 6 months: None ?-Patient reports consistent use of maintenance inhaler ?-Frequency of rescue inhaler use: NA ?-Counseled on When to use rescue inhaler ?-Recommended to continue current medication ? ?Chronic Kidney Disease Stage 3a  ?-All medications assessed for renal dosing and appropriateness in chronic kidney  disease. ?-Recommended to continue current medication ? ?Patient Goals/Self-Care Activities ?Over the next 90 days, patient will:  ?- check glucose 3 times daily, before breakfast, supper, and bedtime, document, and provide at future appointments ?check blood pressure 2-3 times weekly, document, and provide at future appointments ? ?Follow Up Plan: Telephone follow up appointment with care management team member scheduled for:  02/10/2022 at 9:15 AM ?  ? ?Patient agreed to services and verbal consent obtained.  ? ?Patient verbalizes understanding of instructions and care plan provided today and agrees to view in [place]. Active MyChart status and patient understanding of how to access instructions and care plan via MyChart confirmed with patient.    ? ?Doristine Section, BCACP, CPP ?Clinical Pharmacist Practitioner  ?[facility] ?[phone]  ?

## 2022-01-06 NOTE — Addendum Note (Signed)
Addended by: Daron Offer A on: 01/06/2022 02:09 PM ? ? Modules accepted: Orders ? ?

## 2022-01-06 NOTE — Progress Notes (Signed)
? ?Chronic Care Management ?Pharmacy Note ? ?01/06/2022 ?Name:  Amber Reyes MRN:  [id number] DOB:  October 10, 1945 ? ?Summary: ?Patient presents for CCM follow-up. Home blood pressures have been elevated. Patient is candidate for SGLT2-inhibitor given CKD, macroalbuminuria.  ? ?Recommendations/Changes made from today's visit: ?-DECREASE Humalog 75/25 to 6 units twice daily with meals  ?-START Farxiga 10 mg daily. Will provide patient with 30-day voucher and start on PAP.  ?-Recheck BMP in 2 weeks. ?-Recommend rechecking Microalbumin at PCP follow-up in July.  ? ?Plan: ?HC DM assessment in 2 weeks to assess tolerability ensure PAP was approved.  ?CPP follow-up in 1 month ? ?Subjective: ?Amber Reyes is an 76 y.o. year old female who is a primary patient of Sowles, Drue Stager, MD.  The CCM team was consulted for assistance with disease management and care coordination needs.   ? ?Engaged with patient by telephone for follow up visit in response to provider referral for pharmacy case management and/or care coordination services.  ? ?Consent to Services:  ?The patient was given information about Chronic Care Management services, agreed to services, and gave verbal consent prior to initiation of services.  Please see initial visit note for detailed documentation.  ? ?Patient Care Team: ?Steele Sizer, MD as PCP - General (Family Medicine) ?Wellington Hampshire, MD as Consulting Physician (Cardiology) ?Germaine Pomfret, [facility] as Pharmacist (Pharmacist) ? ?Recent office visits: ?11/11/21: Patient presented to Dr. Ancil Boozer for follow-up.  ? ?Recent consult visits: ?10/28/21: Patient presented to Sherlynn Stalls, ANP (Nephrology).  ? ?Hospital visits: ?None in previous 6 months ? ?Objective: ? ?Lab Results  ?Component Value Date  ? CREATININE 1.31 (H) 11/11/2021  ? BUN 38 (H) 11/11/2021  ? GFRNONAA 41 (L) 05/12/2020  ? GFRAA 48 (L) 05/12/2020  ? NA 139 11/11/2021  ? K 5.0 11/11/2021  ? CALCIUM 8.9 11/11/2021  ? CO2 25 11/11/2021   ? ? ?Lab Results  ?Component Value Date/Time  ? HGBA1C 7.6 (H) 11/11/2021 09:37 AM  ? HGBA1C 7.0 (A) 07/14/2021 08:18 AM  ? HGBA1C 7.0 (A) 03/09/2021 08:03 AM  ? HGBA1C 6.8 (H) 05/14/2019 12:00 AM  ? HGBA1C 6.5 01/01/2019 07:48 AM  ? HGBA1C 6.6 08/30/2018 07:46 AM  ? HGBA1C 6.6 (A) 08/30/2018 07:46 AM  ? HGBA1C 6.6 08/30/2018 07:46 AM  ? MICROALBUR 31.9 07/14/2021 09:01 AM  ? MICROALBUR 5.4 05/14/2019 12:00 AM  ? MICROALBUR 20 08/15/2015 08:12 AM  ?  ?Last diabetic Eye exam:  ?Lab Results  ?Component Value Date/Time  ? HMDIABEYEEXA No Retinopathy 08/19/2021 12:00 AM  ?  ?Last diabetic Foot exam: No results found for: HMDIABFOOTEX  ? ?Lab Results  ?Component Value Date  ? CHOL 134 07/14/2021  ? HDL 51 07/14/2021  ? [place] 67 07/14/2021  ? TRIG 78 07/14/2021  ? CHOLHDL 2.6 07/14/2021  ? ? ? ?  Latest Ref Rng & Units 11/11/2021  ?  9:37 AM 07/14/2021  ?  9:01 AM 05/12/2020  ?  8:59 AM  ?Hepatic Function  ?Total Protein 6.1 - 8.1 g/dL 6.7   6.6   6.8    ?AST 10 - 35 U/L _0 ?ALT 6 - 29 U/L _1 ?Total Bilirubin 0.2 - 1.2 mg/dL 0.4   0.5   0.5    ? ? ?No results found for: TSH, FREET4 ? ? ?  Latest Ref Rng & Units 07/14/2021  ?  9:01 AM  11/19/2020  ?  8:26 AM 05/12/2020  ?  8:59 AM  ?CBC  ?WBC 3.8 - 10.8 Thousand/uL 8.9   7.6   8.6    ?Hemoglobin 11.7 - 15.5 g/dL 10.8   10.2   10.3    ?Hematocrit 35.0 - 45.0 % 33.4   31.9   31.7    ?Platelets 140 - 400 Thousand/uL 228   225   229    ? ? ?Lab Results  ?Component Value Date/Time  ? VD25OH 45 05/12/2020 08:59 AM  ? VD25OH 48 05/14/2019 12:00 AM  ? ? ?Clinical ASCVD: Yes  ?The 10-year ASCVD risk score (Arnett DK, et al., 2019) is: 24% ?  Values used to calculate the score: ?    Age: 76 years ?    Sex: Female ?    Is Non-Hispanic African American: Yes ?    Diabetic: Yes ?    Tobacco smoker: No ?    Systolic Blood Pressure: 462 mmHg ?    Is BP treated: Yes ?    HDL Cholesterol: 51 mg/dL ?    Total Cholesterol: 134 mg/dL   ? ? ?  11/11/2021  ?  8:16 AM  07/14/2021  ?  8:04 AM 06/23/2021  ?  8:24 AM  ?Depression screen PHQ 2/9  ?Decreased Interest 0 0 0  ?Down, Depressed, Hopeless 0 0 0  ?PHQ - 2 Score 0 0 0  ?Altered sleeping 0 0   ?Tired, decreased energy 0 0   ?Change in appetite 0 0   ?Feeling bad or failure about yourself  0 0   ?Trouble concentrating 0 0   ?Moving slowly or fidgety/restless 0 0   ?Suicidal thoughts 0 0   ?PHQ-9 Score 0 0   ?  ?Social History  ? ?Tobacco Use  ?Smoking Status Never  ?Smokeless Tobacco Never  ?Tobacco Comments  ? n/a  ? ?BP Readings from Last 3 Encounters:  ?11/11/21 138/82  ?10/29/21 130/60  ?09/29/21 116/65  ? ?Pulse Readings from Last 3 Encounters:  ?11/11/21 91  ?10/29/21 66  ?09/29/21 (!) 50  ? ?Wt Readings from Last 3 Encounters:  ?11/11/21 (!) 353 lb (160.1 kg)  ?10/29/21 (!) 346 lb 4 oz (157.1 kg)  ?09/29/21 (!) 350 lb (158.8 kg)  ? ? ?Assessment/Interventions: Review of patient past medical history, allergies, medications, health status, including review of consultants reports, laboratory and other test data, was performed as part of comprehensive evaluation and provision of chronic care management services.  ? ?SDOH:  (Social Determinants of Health) assessments and interventions performed: Yes ?SDOH Interventions   ? ?Flowsheet Row Most Recent Value  ?SDOH Interventions   ?Financial Strain Interventions Other (Comment)  [PAP]  ? ?  ? ? ? ? ?[place] ? ?No Known Allergies ? ?Medications Reviewed Today   ? ? Reviewed by Steele Sizer, MD (Physician) on 11/11/21 at 0900  Med List Status: <None>  ? ?Medication Order Taking? Sig Documenting Provider Last Dose Status Informant  ?acetaminophen (TYLENOL) 650 MG CR tablet [id number] Yes Take 650 mg by mouth in the morning and at bedtime. [provider] Taking Active   ?Discontinued 11/11/21 0900 (Completed Course)   ?albuterol (PROVENTIL HFA;VENTOLIN HFA) 108 (90 BASE) MCG/ACT inhaler [id number] Yes Inhale 2 puffs into the lungs every 6 (six) hours as needed for  wheezing or shortness of breath. [provider] Taking Active Self  ?allopurinol (ZYLOPRIM) 100 MG tablet [id number] Yes Take 1 tablet (100 mg total) by mouth 2 (two)  times daily. Steele Sizer, MD Taking Active   ?apixaban (ELIQUIS) 5 MG TABS tablet [id number] Yes Take 1 tablet (5 mg total) by mouth 2 (two) times daily. Wellington Hampshire, MD Taking Active   ?Blood Glucose Calibration (TRUE METRIX LEVEL 1) Low SOLN [id number] Yes 1 each by In Vitro route once a week. Steele Sizer, MD Taking Active   ?Blood Glucose Monitoring Suppl (TRUE METRIX AIR GLUCOSE METER) w/Device KIT [id number] Yes Inject 1 each into the skin 4 (four) times daily as needed. Check fsbs 4 times daily E11.22 Steele Sizer, MD Taking Active   ?Cholecalciferol (VITAMIN D) 2000 UNITS tablet [id number] Yes Take 2,000 Units by mouth daily. [provider] Taking Active Self  ?colchicine (COLCRYS) 0.6 MG tablet [id number] Yes Take 1-2 tablets (0.6-1.2 mg total) by mouth daily as needed. Steele Sizer, MD Taking Active   ?diltiazem (CARDIZEM CD) 240 MG 24 hr capsule [id number] Yes TAKE 1 CAPSULE EVERY DAY Wellington Hampshire, MD Taking Active   ?DROPLET PEN NEEDLES 30G X 8 MM MISC [id number] Yes USE TWICE DAILY WITH INSULIN Steele Sizer, MD Taking Active   ?Dulaglutide (TRULICITY) 1.5 WX/4.6MK SOPN [id number] Yes Inject 1.5 mg into the skin once a week. Steele Sizer, MD Taking Active   ?Elastic Bandages & Supports ([place]) [place] [id number] Yes 2 each by Does not apply route daily. Steele Sizer, MD Taking Active Self  ?fluticasone (FLONASE) 50 MCG/ACT nasal spray [id number] Yes Place 2 sprays into both nostrils daily. Steele Sizer, MD Taking Active   ?fluticasone-salmeterol (ADVAIR) 250-50 MCG/ACT AEPB [id number] Yes INHALE 1 PUFF BY MOUTH TWICE DAILY Sowles, Drue Stager, MD Taking Active   ?furosemide (LASIX) 40 MG tablet [id number] Yes TAKE 1 TABLET (40 MG TOTAL) BY MOUTH 2 (TWO) TIMES DAILY AS NEEDED.  Steele Sizer, MD Taking Active   ?glucose blood (TRUE METRIX BLOOD GLUCOSE TEST) test strip [id number] Yes CHECK BLOOD SUGAR FOUR TIMES DAILY Steele Sizer, MD Taking Active   ?Insulin Lispro Prot & Lispro (HUMAL

## 2022-01-07 ENCOUNTER — Other Ambulatory Visit: Payer: Self-pay | Admitting: Family Medicine

## 2022-01-07 DIAGNOSIS — E1122 Type 2 diabetes mellitus with diabetic chronic kidney disease: Secondary | ICD-10-CM

## 2022-01-13 MED ORDER — DAPAGLIFLOZIN PROPANEDIOL 10 MG PO TABS: 10.0000 mg | ORAL_TABLET | Freq: Every day | ORAL | 3 refills | Status: DC

## 2022-01-13 NOTE — Addendum Note (Signed)
Addended by: Daron Offer A on: 01/13/2022 09:38 AM   Modules accepted: Orders

## 2022-01-20 ENCOUNTER — Telehealth: Payer: Self-pay

## 2022-01-20 DIAGNOSIS — E1122 Type 2 diabetes mellitus with diabetic chronic kidney disease: Secondary | ICD-10-CM | POA: Diagnosis not present

## 2022-01-20 DIAGNOSIS — Z794 Long term (current) use of insulin: Secondary | ICD-10-CM

## 2022-01-20 DIAGNOSIS — I129 Hypertensive chronic kidney disease with stage 1 through stage 4 chronic kidney disease, or unspecified chronic kidney disease: Secondary | ICD-10-CM

## 2022-01-20 DIAGNOSIS — N1831 Chronic kidney disease, stage 3a: Secondary | ICD-10-CM | POA: Diagnosis not present

## 2022-01-20 DIAGNOSIS — E785 Hyperlipidemia, unspecified: Secondary | ICD-10-CM

## 2022-01-20 DIAGNOSIS — I4891 Unspecified atrial fibrillation: Secondary | ICD-10-CM

## 2022-01-20 DIAGNOSIS — J45909 Unspecified asthma, uncomplicated: Secondary | ICD-10-CM | POA: Diagnosis not present

## 2022-01-20 NOTE — Progress Notes (Signed)
    Chronic Care Management Pharmacy Assistant   Name: Amber Reyes  MRN: [id number] DOB: 12/03/1945  Patient Assistance Application for Wilder Glade  I contacted AZ[employer]ME and spoke with [place] the representative who advised that the patient's medication was shipped on 01/19/2022 and she should receive it in the next few business days.  Medications: Outpatient Encounter Medications as of 01/20/2022  Medication Sig   acetaminophen (TYLENOL) 650 MG CR tablet Take 650 mg by mouth in the morning and at bedtime.   albuterol (PROVENTIL HFA;VENTOLIN HFA) 108 (90 BASE) MCG/ACT inhaler Inhale 2 puffs into the lungs every 6 (six) hours as needed for wheezing or shortness of breath.   allopurinol (ZYLOPRIM) 100 MG tablet Take 1 tablet (100 mg total) by mouth 2 (two) times daily.   apixaban (ELIQUIS) 5 MG TABS tablet Take 1 tablet (5 mg total) by mouth 2 (two) times daily.   Blood Glucose Calibration (TRUE METRIX LEVEL 1) Low SOLN 1 each by In Vitro route once a week.   Blood Glucose Monitoring Suppl (TRUE METRIX AIR GLUCOSE METER) w/Device KIT Inject 1 each into the skin 4 (four) times daily as needed. Check fsbs 4 times daily E11.22   Cholecalciferol (VITAMIN D) 2000 UNITS tablet Take 2,000 Units by mouth daily.   colchicine (COLCRYS) 0.6 MG tablet Take 1-2 tablets (0.6-1.2 mg total) by mouth daily as needed.   dapagliflozin propanediol (FARXIGA) 10 MG TABS tablet Take 1 tablet (10 mg total) by mouth daily before breakfast. Via AZ&ME Patient Assistance   diltiazem (CARDIZEM CD) 240 MG 24 hr capsule TAKE 1 CAPSULE EVERY DAY   DROPLET PEN NEEDLES 30G X 8 MM MISC USE TWICE DAILY WITH INSULIN   Dulaglutide (TRULICITY) 1.5 CV/0.1TH SOPN Inject 1.5 mg into the skin once a week.   Elastic Bandages & Supports (MEDICAL COMPRESSION STOCKINGS) MISC 2 each by Does not apply route daily.   fluticasone (FLONASE) 50 MCG/ACT nasal spray Place 2 sprays into both nostrils daily.   fluticasone-salmeterol (ADVAIR) 250-50  MCG/ACT AEPB INHALE 1 PUFF BY MOUTH TWICE DAILY   furosemide (LASIX) 40 MG tablet TAKE 1 TABLET (40 MG TOTAL) BY MOUTH 2 (TWO) TIMES DAILY AS NEEDED.   Insulin Lispro Prot & Lispro (HUMALOG MIX 75/25 KWIKPEN) (75-25) 100 UNIT/ML Kwikpen Inject 6 Units into the skin 2 (two) times daily with a meal.   irbesartan (AVAPRO) 300 MG tablet Take 1 tablet (300 mg total) by mouth daily.   potassium chloride SA (KLOR-CON) 20 MEQ tablet Take 1 tablet (20 mEq total) by mouth daily. With furosemide   pravastatin (PRAVACHOL) 80 MG tablet Take 1 tablet (80 mg total) by mouth every evening.   TRUE METRIX BLOOD GLUCOSE TEST test strip CHECK BLOOD SUGAR FOUR TIMES DAILY   TRUEplus Lancets 33G MISC TEST BLOOD SUGAR THREE TIMES DAILY AS NEEDED   TYLENOL 325 MG tablet    No facility-administered encounter medications on file as of 01/20/2022.   [name], CPA/CMA Clinical Pharmacist Assistant Phone: [phone]

## 2022-01-25 ENCOUNTER — Telehealth: Payer: Self-pay

## 2022-01-25 NOTE — Progress Notes (Signed)
Chronic Care Management Pharmacy Assistant   Name: CORTANA VANDERFORD  MRN: [id number] DOB: May 29, 1946  Reason for Encounter: Diabetes Disease State Call  Recent office visits:  None ID  Recent consult visits:  None ID  Hospital visits:  None in previous 6 months  Medications: Outpatient Encounter Medications as of 01/25/2022  Medication Sig   acetaminophen (TYLENOL) 650 MG CR tablet Take 650 mg by mouth in the morning and at bedtime.   albuterol (PROVENTIL HFA;VENTOLIN HFA) 108 (90 BASE) MCG/ACT inhaler Inhale 2 puffs into the lungs every 6 (six) hours as needed for wheezing or shortness of breath.   allopurinol (ZYLOPRIM) 100 MG tablet Take 1 tablet (100 mg total) by mouth 2 (two) times daily.   apixaban (ELIQUIS) 5 MG TABS tablet Take 1 tablet (5 mg total) by mouth 2 (two) times daily.   Blood Glucose Calibration (TRUE METRIX LEVEL 1) Low SOLN 1 each by In Vitro route once a week.   Blood Glucose Monitoring Suppl (TRUE METRIX AIR GLUCOSE METER) w/Device KIT Inject 1 each into the skin 4 (four) times daily as needed. Check fsbs 4 times daily E11.22   Cholecalciferol (VITAMIN D) 2000 UNITS tablet Take 2,000 Units by mouth daily.   colchicine (COLCRYS) 0.6 MG tablet Take 1-2 tablets (0.6-1.2 mg total) by mouth daily as needed.   dapagliflozin propanediol (FARXIGA) 10 MG TABS tablet Take 1 tablet (10 mg total) by mouth daily before breakfast. Via [place] Patient Assistance   diltiazem (CARDIZEM CD) 240 MG 24 hr capsule TAKE 1 CAPSULE EVERY DAY   DROPLET PEN NEEDLES 30G X 8 MM MISC USE TWICE DAILY WITH INSULIN   Dulaglutide (TRULICITY) 1.5 NV/9.74YO SOPN Inject 1.5 mg into the skin once a week.   Elastic Bandages & Supports (MEDICAL COMPRESSION STOCKINGS) MISC 2 each by Does not apply route daily.   fluticasone (FLONASE) 50 MCG/ACT nasal spray Place 2 sprays into both nostrils daily.   fluticasone-salmeterol (ADVAIR) 250-50 MCG/ACT AEPB INHALE 1 PUFF BY MOUTH TWICE DAILY   furosemide  (LASIX) 40 MG tablet TAKE 1 TABLET (40 MG TOTAL) BY MOUTH 2 (TWO) TIMES DAILY AS NEEDED.   Insulin Lispro Prot & Lispro (HUMALOG MIX 75/25 KWIKPEN) (75-25) 100 UNIT/ML Kwikpen Inject 6 Units into the skin 2 (two) times daily with a meal.   irbesartan (AVAPRO) 300 MG tablet Take 1 tablet (300 mg total) by mouth daily.   potassium chloride SA (KLOR-CON) 20 MEQ tablet Take 1 tablet (20 mEq total) by mouth daily. With furosemide   pravastatin (PRAVACHOL) 80 MG tablet Take 1 tablet (80 mg total) by mouth every evening.   TRUE METRIX BLOOD GLUCOSE TEST test strip CHECK BLOOD SUGAR FOUR TIMES DAILY   TRUEplus Lancets 33G MISC TEST BLOOD SUGAR THREE TIMES DAILY AS NEEDED   TYLENOL 325 MG tablet    No facility-administered encounter medications on file as of 01/25/2022.   Care Gaps: Zoster Vaccines [name] Diabetic Foot Exam  Star Rating Drugs: Irbesartan 300 mg last filled on 11/22/2021 for a 90-Day supply with [place] Pravastatin 80 mg last filled on 11/03/2021 for a 90-Day supply with [place] Trulicity 1.5 mg patient receives this medication through [employer] Patient Assistance Program  Recent Relevant Labs: Lab Results  Component Value Date/Time   HGBA1C 7.6 (H) 11/11/2021 09:37 AM   HGBA1C 7.0 (A) 07/14/2021 08:18 AM   HGBA1C 7.0 (A) 03/09/2021 08:03 AM   HGBA1C 6.8 (H) 05/14/2019 12:00 AM   HGBA1C  6.5 01/01/2019 07:48 AM   HGBA1C 6.6 08/30/2018 07:46 AM   HGBA1C 6.6 (A) 08/30/2018 07:46 AM   HGBA1C 6.6 08/30/2018 07:46 AM   MICROALBUR 31.9 07/14/2021 09:01 AM   MICROALBUR 5.4 05/14/2019 12:00 AM   MICROALBUR 20 08/15/2015 08:12 AM    Kidney Function Lab Results  Component Value Date/Time   CREATININE 1.31 (H) 11/11/2021 09:37 AM   CREATININE 1.16 (H) 07/14/2021 09:01 AM   GFRNONAA 41 (L) 05/12/2020 08:59 AM   GFRAA 48 (L) 05/12/2020 08:59 AM    Current antihyperglycemic regimen:  Humalog 75/25 6 units twice daily before a  meal Farxiga 10 mg daily before breakfast Trulicity 1.5 mg weekly  What recent interventions/DTPs have been made to improve glycemic control:  Patient started Farxiga 10 mg  daily on 01/18/2022  Have there been any recent hospitalizations or ED visits since last visit with CPP? No  Patient denies hypoglycemic symptoms, including Pale, Sweaty, Shaky, Hungry, Nervous/irritable, and Vision changes Patient reports hyperglycemic symptoms, including fatigue  How often are you checking your blood sugar? once daily  What are your blood sugars ranging?  Fasting: 120-139  During the week, how often does your blood glucose drop below 70? Never  Adherence Review: Is the patient currently on a STATIN medication? Yes Is the patient currently on ACE/ARB medication? Yes Does the patient have >5 day gap between last estimated fill dates? No  I spoke with patient, and she reports that she started the [place] on 01/18/2022. Per patient she is experiencing some fatigue since starting this medication. Patient stated it also appears that her blood sugars have increased since decreasing her Humalog to 6 units daily. Patient advised she will check her blood sugars on 02/01/2022 for her kidney function. Patient is concerned that her blood sugars are high now than they were before decreasing insulin and starting the [place].   I advised patient today makes 1 week she has been on the [place] but I will inform CPP of her concerns and advise if there is anything he would like for her to change prior to her follow-up with him on 06/21. I advised patient to continue taking 6 units twice daily prior to her meals and her [place] once daily before her meal and if CPP would like to change anything I will call her back. Patient verbalized understanding. I also provided my direct number for the patient to call if her numbers continue to increase prior to her follow-up or if she continues to feel bad regarding her low energy.  Patient verbalized understanding to all.  [name], CPA/CMA Clinical Pharmacist Assistant Phone: [phone]

## 2022-02-01 DIAGNOSIS — N183 Chronic kidney disease, stage 3 unspecified: Secondary | ICD-10-CM | POA: Diagnosis not present

## 2022-02-01 DIAGNOSIS — E1122 Type 2 diabetes mellitus with diabetic chronic kidney disease: Secondary | ICD-10-CM | POA: Diagnosis not present

## 2022-02-01 DIAGNOSIS — Z794 Long term (current) use of insulin: Secondary | ICD-10-CM | POA: Diagnosis not present

## 2022-02-01 LAB — BASIC METABOLIC PANEL
BUN/Creatinine Ratio: 22 (calc) (ref 6–22)
BUN: 31 mg/dL — ABNORMAL HIGH (ref 7–25)
CO2: 24 mmol/L (ref 20–32)
Calcium: 8.8 mg/dL (ref 8.6–10.4)
Chloride: 107 mmol/L (ref 98–110)
Creat: 1.44 mg/dL — ABNORMAL HIGH (ref 0.60–1.00)
Glucose, Bld: 180 mg/dL — ABNORMAL HIGH (ref 65–99)
Potassium: 4.3 mmol/L (ref 3.5–5.3)
Sodium: 140 mmol/L (ref 135–146)

## 2022-02-09 ENCOUNTER — Telehealth: Payer: Self-pay

## 2022-02-09 NOTE — Progress Notes (Cosign Needed)
    Chronic Care Management Pharmacy Assistant   Name: Amber Reyes  MRN: [id number] DOB: October 16, 1945  Patient called to be reminded of her telephone appointment with [name], CPP on 02/10/2022 @ 0915.   Patient aware of appointment date, time, and type of appointment (either telephone or in person). Patient aware to have/bring all medications, supplements, blood pressure and/or blood sugar logs to visit.   Star Rating Drug: Irbesartan 300 mg last filled on 11/22/2021 for a 90-Day supply with [place] Pravastatin 80 mg last filled on 01/10/2022 for a 90-Day supply with [place] Trulicity 1.5 mg patient receives this medication through [place] Program   Any gaps in medications fill history? No   Care Gaps: Zoster Vaccines [name] Diabetic Foot Exam  [name], CPA/CMA Clinical Pharmacist Assistant Phone: [phone]

## 2022-02-10 ENCOUNTER — Ambulatory Visit (INDEPENDENT_AMBULATORY_CARE_PROVIDER_SITE_OTHER): Payer: Medicare PPO

## 2022-02-10 DIAGNOSIS — N183 Chronic kidney disease, stage 3 unspecified: Secondary | ICD-10-CM

## 2022-02-10 DIAGNOSIS — I119 Hypertensive heart disease without heart failure: Secondary | ICD-10-CM

## 2022-02-10 NOTE — Progress Notes (Signed)
Chronic Care Management Pharmacy Note  02/10/2022 Name:  Amber Reyes MRN:  [id number] DOB:  1945-09-18  Summary: Patient presents for CCM follow-up.  -Tolerating Farxiga well, does report some lightheadedness which has been gradually improving. Counseled patient on diuretic effect of Farxiga and to ensure adequate hydration.   -Moderate elevation in blood pressure, but patient reports increased stress in setting of caring for husband, who was hospitalized.   Recommendations/Changes made from today's visit: -INCREASE Humalog to 6 units before breakfast, 8 units before supper.   -Recommend rechecking Microalbumin at PCP follow-up in July.   Plan:  CPP follow-up in 1 month  Subjective: Amber Reyes is an 76 y.o. year old female who is a primary patient of Steele Sizer, MD.  The CCM team was consulted for assistance with disease management and care coordination needs.    Engaged with patient by telephone for follow up visit in response to provider referral for pharmacy case management and/or care coordination services.   Consent to Services:  The patient was given information about Chronic Care Management services, agreed to services, and gave verbal consent prior to initiation of services.  Please see initial visit note for detailed documentation.   Patient Care Team: Steele Sizer, MD as PCP - General (Family Medicine) Wellington Hampshire, MD as Consulting Physician (Cardiology) Germaine Pomfret, [facility] as Pharmacist (Pharmacist)  Recent office visits: 11/11/21: Patient presented to Dr. Ancil Boozer for follow-up.   Recent consult visits: 10/28/21: Patient presented to Sherlynn Stalls, ANP (Nephrology).   Hospital visits: None in previous 6 months  Objective:  Lab Results  Component Value Date   CREATININE 1.44 (H) 02/01/2022   BUN 31 (H) 02/01/2022   GFRNONAA 41 (L) 05/12/2020   GFRAA 48 (L) 05/12/2020   NA 140 02/01/2022   K 4.3 02/01/2022   CALCIUM 8.8  02/01/2022   CO2 24 02/01/2022    Lab Results  Component Value Date/Time   HGBA1C 7.6 (H) 11/11/2021 09:37 AM   HGBA1C 7.0 (A) 07/14/2021 08:18 AM   HGBA1C 7.0 (A) 03/09/2021 08:03 AM   HGBA1C 6.8 (H) 05/14/2019 12:00 AM   HGBA1C 6.5 01/01/2019 07:48 AM   HGBA1C 6.6 08/30/2018 07:46 AM   HGBA1C 6.6 (A) 08/30/2018 07:46 AM   HGBA1C 6.6 08/30/2018 07:46 AM   MICROALBUR 31.9 07/14/2021 09:01 AM   MICROALBUR 5.4 05/14/2019 12:00 AM   MICROALBUR 20 08/15/2015 08:12 AM    Last diabetic Eye exam:  Lab Results  Component Value Date/Time   HMDIABEYEEXA No Retinopathy 08/19/2021 12:00 AM    Last diabetic Foot exam: No results found for: "HMDIABFOOTEX"   Lab Results  Component Value Date   CHOL 134 07/14/2021   HDL 51 07/14/2021   LDLCALC 67 07/14/2021   TRIG 78 07/14/2021   CHOLHDL 2.6 07/14/2021       Latest Ref Rng & Units 11/11/2021    9:37 AM 07/14/2021    9:01 AM 05/12/2020    8:59 AM  Hepatic Function  Total Protein 6.1 - 8.1 g/dL 6.7  6.6  6.8   AST 10 - 35 U/L _0 ALT 6 - 29 U/L _1 Total Bilirubin 0.2 - 1.2 mg/dL 0.4  0.5  0.5     No results found for: "TSH", "FREET4"     Latest Ref Rng & Units 07/14/2021    9:01 AM 11/19/2020    8:26 AM 05/12/2020    8:59 AM  CBC  WBC 3.8 - 10.8 Thousand/uL 8.9  7.6  8.6   Hemoglobin 11.7 - 15.5 g/dL 10.8  10.2  10.3   Hematocrit 35.0 - 45.0 % 33.4  31.9  31.7   Platelets 140 - 400 Thousand/uL 228  225  229     Lab Results  Component Value Date/Time   VD25OH 45 05/12/2020 08:59 AM   VD25OH 48 05/14/2019 12:00 AM    Clinical ASCVD: Yes  The 10-year ASCVD risk score (Arnett DK, et al., 2019) is: 24.8%   Values used to calculate the score:     Age: 76 years     Sex: Female     Is Non-Hispanic African American: Yes     Diabetic: Yes     Tobacco smoker: No     Systolic Blood Pressure: 127 mmHg     Is BP treated: Yes     HDL Cholesterol: 51 mg/dL     Total Cholesterol: 134 mg/dL       11/11/2021     8:16 AM 07/14/2021    8:04 AM 06/23/2021    8:24 AM  Depression screen PHQ 2/9  Decreased Interest 0 0 0  Down, Depressed, Hopeless 0 0 0  PHQ - 2 Score 0 0 0  Altered sleeping 0 0   Tired, decreased energy 0 0   Change in appetite 0 0   Feeling bad or failure about yourself  0 0   Trouble concentrating 0 0   Moving slowly or fidgety/restless 0 0   Suicidal thoughts 0 0   PHQ-9 Score 0 0     Social History   Tobacco Use  Smoking Status Never  Smokeless Tobacco Never  Tobacco Comments   n/a   BP Readings from Last 3 Encounters:  11/11/21 138/82  10/29/21 130/60  09/29/21 116/65   Pulse Readings from Last 3 Encounters:  11/11/21 91  10/29/21 66  09/29/21 (!) 50   Wt Readings from Last 3 Encounters:  11/11/21 (!) 353 lb (160.1 kg)  10/29/21 (!) 346 lb 4 oz (157.1 kg)  09/29/21 (!) 350 lb (158.8 kg)    Assessment/Interventions: Review of patient past medical history, allergies, medications, health status, including review of consultants reports, laboratory and other test data, was performed as part of comprehensive evaluation and provision of chronic care management services.   SDOH:  (Social Determinants of Health) assessments and interventions performed: Yes      CCM Care Plan  No Known Allergies  Medications Reviewed Today     Reviewed by Steele Sizer, MD (Physician) on 11/11/21 at 0900  Med List Status: <None>   Medication Order Taking? Sig Documenting Provider Last Dose Status Informant  acetaminophen (TYLENOL) 650 MG CR tablet [id number] Yes Take 650 mg by mouth in the morning and at bedtime. [provider] Taking Active   Discontinued 11/11/21 0900 (Completed Course)   albuterol (PROVENTIL HFA;VENTOLIN HFA) 108 (90 BASE) MCG/ACT inhaler [id number] Yes Inhale 2 puffs into the lungs every 6 (six) hours as needed for wheezing or shortness of breath. [provider] Taking Active Self  allopurinol (ZYLOPRIM) 100 MG tablet [id number] Yes Take  1 tablet (100 mg total) by mouth 2 (two) times daily. Steele Sizer, MD Taking Active   apixaban (ELIQUIS) 5 MG TABS tablet [id number] Yes Take 1 tablet (5 mg total) by mouth 2 (two) times daily. Wellington Hampshire, MD Taking Active   Blood Glucose Calibration (TRUE METRIX LEVEL 1) Low SOLN [id number] Yes 1 each  by In Vitro route once a week. Steele Sizer, MD Taking Active   Blood Glucose Monitoring Suppl (TRUE METRIX AIR GLUCOSE METER) w/Device KIT [id number] Yes Inject 1 each into the skin 4 (four) times daily as needed. Check fsbs 4 times daily E11.22 Steele Sizer, MD Taking Active   Cholecalciferol (VITAMIN D) 2000 UNITS tablet [id number] Yes Take 2,000 Units by mouth daily. [provider] Taking Active Self  colchicine (COLCRYS) 0.6 MG tablet [id number] Yes Take 1-2 tablets (0.6-1.2 mg total) by mouth daily as needed. Steele Sizer, MD Taking Active   diltiazem [facility] CD) 240 MG 24 hr capsule [id number] Yes TAKE 1 CAPSULE EVERY DAY Wellington Hampshire, MD Taking Active   DROPLET PEN NEEDLES 30G X 8 MM MISC [id number] Yes USE TWICE DAILY WITH INSULIN Ancil Boozer, Drue Stager, MD Taking Active   Dulaglutide (TRULICITY) 1.5 WI/0.9BD SOPN [id number] Yes Inject 1.5 mg into the skin once a week. Steele Sizer, MD Taking Active   Elastic Bandages & Supports ([place]) [place] [id number] Yes 2 each by Does not apply route daily. Steele Sizer, MD Taking Active Self  fluticasone (FLONASE) 50 MCG/ACT nasal spray [id number] Yes Place 2 sprays into both nostrils daily. Steele Sizer, MD Taking Active   fluticasone-salmeterol (ADVAIR) 250-50 MCG/ACT AEPB [id number] Yes INHALE 1 PUFF BY MOUTH TWICE DAILY Ancil Boozer, Drue Stager, MD Taking Active   furosemide (LASIX) 40 MG tablet [id number] Yes TAKE 1 TABLET (40 MG TOTAL) BY MOUTH 2 (TWO) TIMES DAILY AS NEEDED. Steele Sizer, MD Taking Active   glucose blood (TRUE METRIX BLOOD GLUCOSE TEST) test strip [id number] Yes CHECK BLOOD SUGAR FOUR TIMES  DAILY Steele Sizer, MD Taking Active   Insulin Lispro Prot & Lispro (HUMALOG MIX 75/25 KWIKPEN) (75-25) 100 UNIT/ML Claiborne Rigg [id number] Yes Inject 10 Units into the skin 2 (two) times daily with a meal. Steele Sizer, MD Taking Active   irbesartan (AVAPRO) 300 MG tablet [id number] Yes Take 1 tablet (300 mg total) by mouth daily. Steele Sizer, MD Taking Active   potassium chloride SA (KLOR-CON) 20 MEQ tablet [id number] Yes Take 1 tablet (20 mEq total) by mouth daily. With furosemide Steele Sizer, MD Taking Active   pravastatin (PRAVACHOL) 80 MG tablet [id number] Yes Take 1 tablet (80 mg total) by mouth every evening. Steele Sizer, MD Taking Active   TRUEplus Lancets 33G [place] [id number] Yes 1 each by Does not apply route 3 (three) times daily as needed. Steele Sizer, MD Taking Active   TYLENOL 325 MG tablet [id number] Yes  [provider] Taking Active             Patient Active Problem List   Diagnosis Date Noted   Localized, 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/22/2021   Pain in joint of left shoulder 04/22/2021   Tendonitis of shoulder, left 04/22/2021   Hypertension associated with stage 3 chronic kidney disease due to type 2 diabetes mellitus ([place]) 02/06/2020   History of colonic polyps    Polyp of colon    Thoracic aortic atherosclerosis ([place]) 12/30/2016   Controlled type 2 diabetes mellitus with stage 3 chronic kidney disease ([place]) 12/23/2016   Venous insufficiency 05/21/2016   Hypertensive heart disease 05/21/2016   Anemia in chronic kidney disease 12/22/2015   Pulmonary hypertension ([place]) 12/18/2015   Xanthelasma 04/15/2015   Anxiety 04/12/2015   Chronic kidney disease (CKD), stage III (moderate) (HCC) 04/12/2015   Edema extremities 04/12/2015   Disc disorder of lumbar region 04/12/2015   Asthma, moderate persistent 04/12/2015   Morbid obesity,  unspecified obesity type ([place]) 04/12/2015   Obstructive apnea 04/12/2015   Restless leg 04/12/2015    Allergic rhinitis 04/12/2015   Vitamin D deficiency 04/12/2015   Controlled gout 04/12/2015   Premature atrial contractions 11/29/2013   Atrial fibrillation ([place]) 11/09/2013   Benign essential hypertension    Hyperlipidemia     Immunization History  Administered Date(s) Administered   Fluad Quad(high Dose 65+) 05/14/2019, 05/12/2020, 07/14/2021   Influenza Split 05/16/2007   Influenza, High Dose Seasonal PF 06/24/2017, 04/25/2018   Influenza, Seasonal, Injecte, Preservative Fre 05/07/2010, 04/27/2011   Influenza,inj,Quad PF,6+ Mos 08/02/2014, 04/15/2015, 05/22/2016   Influenza-Unspecified 08/02/2014   Moderna Sars-Covid-2 Vaccination 09/28/2019, 10/26/2019, 07/02/2020   Pneumococcal Conjugate-13 08/02/2014   Pneumococcal Polysaccharide-23 04/27/2011, 06/04/2016   Tdap 09/29/2012   Zoster, Live 09/29/2012    Conditions to be addressed/monitored:  Hypertension, Hyperlipidemia, Diabetes, Atrial Fibrillation, Asthma, Chronic Kidney Disease and Gout  Care Plan : General Pharmacy (Adult)  Updates made by Germaine Pomfret, RPH since 02/10/2022 12:00 AM     Problem: Hypertension, Hyperlipidemia, Diabetes, Atrial Fibrillation, Asthma, Chronic Kidney Disease and Gout   Priority: High     Long-Range Goal: Patient-Specific Goal   Start Date: 10/29/2020  Expected End Date: 04/16/2022  This Visit's Progress: On track  Recent Progress: On track  Priority: High  Note:   Current Barriers:  Unable to independently afford treatment regimen Suboptimal therapeutic regimen for diabetes  Pharmacist Clinical Goal(s):  Over the next 90 days, patient will maintain control of diabetes as evidenced by A1c less than 8%  through collaboration with PharmD and provider.   Interventions: 1:1 collaboration with Steele Sizer, MD regarding development and update of comprehensive plan of care as evidenced by provider attestation and co-signature Inter-disciplinary care team collaboration (see  longitudinal plan of care) Comprehensive medication review performed; medication list updated in electronic medical record  Hypertension (BP goal <130/80) -Controlled -Current treatment: Diltiazem CD 240 mg daily: Appropriate, Effective, Safe, Accessible  Furosemide 40 mg twice daily as needed: Appropriate, Effective, Query Safe Irbersartan 300 mg daily: Appropriate, Effective, Safe, Accessible  -Medications previously tried: NA  -Current home readings:    148/63, 72  144/66, 55   149/70, 68   149/68, 60   133,81, 67   140/70, 65   134/54, 65   151/63, 68   142/62, 68   155/66, 64   -Increased stress in setting of caring for husband, who was hospitalized.  -Current dietary habits: She has been eating less, trying to follow a low-carb, low-sodium diet.   -Current exercise habits: Unable to walk long distances.  -Denies hypotensive/hypertensive symptoms -Recommended to continue current medication  Atrial Fibrillation (Goal: prevent stroke and major bleeding) -Controlled:  -CHADSVASC: 5 -Current treatment: Rate control: Diltiazem CD 240 mg daily  Anticoagulation: Eliquis 5 mg twice daily  -Medications previously tried: NA -Home BP and HR readings: NA  -Counseled on avoidance of NSAIDs due to increased bleeding risk with anticoagulants; -Recommended to continue current medication  Hyperlipidemia: (LDL goal < 70) -Controlled -Current treatment: Pravastatin 80 mg daily  -Medications previously tried: NA  -Educated on Importance of limiting foods high in cholesterol; -Recommended to continue current medication  Diabetes (A1c goal <8%) -Not ideally controlled -Current medications: Farxiga 10 mg daily  Trulicity 1.5 mg weekly: Appropriate, Effective, Safe, Accessible   Humalog 75-25 6 units before breakfast, 6 units before supper: Appropriate, Effective, Safe, Accessible  -Medications previously tried: Metformin  -Current home glucose readings:   Breakfast  21-Jun 145  19-Jun 152  18-Jun 154  17-Jun 137  16-Jun 138  15-Jun 144  14-Jun 142  13-Jun 129  12-Jun 142  11-Jun 129  Average 141   -Denies hypoglycemic symptoms -Tolerating Farxiga well, does report some lightheadedness which has been gradually improving. Counseled patient on diuretic effect of Farxiga and to ensure adequate hydration.  -INCREASE Humalog to 6 units before breakfast, 8 units before supper.  -Recommend rechecking microalbumin at PCP follow-up.   Asthma (Goal: control symptoms and prevent exacerbations) -Controlled -Current treatment  Proventil HFA 2 puffs every 6 hours as needed Advair 1 puff twice daily  -Medications previously tried: NA  -Exacerbations requiring treatment in last 6 months: None -Patient reports consistent use of maintenance inhaler -Frequency of rescue inhaler use: NA -Counseled on When to use rescue inhaler -Recommended to continue current medication  Chronic Kidney Disease Stage 3a  -All medications assessed for renal dosing and appropriateness in chronic kidney disease. -Recommended to continue current medication  Patient Goals/Self-Care Activities Over the next 90 days, patient will:  - check glucose 3 times daily, before breakfast, supper, and bedtime, document, and provide at future appointments check blood pressure 2-3 times weekly, document, and provide at future appointments  Follow Up Plan: Telephone follow up appointment with care management team member scheduled for:  03/31/2022 at 9:15 AM     Medication Assistance:  Trulicity, Humalog obtained through [employer] medication assistance program.  Enrollment ends Dec 2023 [place] Glade obtained through [place] medication assistance program.  Patient's preferred pharmacy is:  [facility] [place], [place] - [place] [address] [place] [address] Phone: [phone] Fax: [place], [place]. [place] [address] Phone: [phone] Fax: [phone]   Uses pill box? Yes Pt endorses 100% compliance  We discussed: Current pharmacy is preferred with insurance plan and patient is satisfied with pharmacy services Patient decided to: Continue current medication management strategy  Care Plan and Follow Up Patient Decision:  Patient agrees to Care Plan and Follow-up.  Plan: Telephone follow up appointment with care management team member scheduled for:  03/31/2022 at [place], [place], [place] Pharmacist Practitioner  [facility] [phone]

## 2022-02-10 NOTE — Patient Instructions (Signed)
Visit Information It was great speaking with you today!  Please let me know if you have any questions about our visit.   Goals Addressed             This Visit's Progress    Monitor and Manage My Blood Sugar-Diabetes Type 2   On track    Timeframe:  Long-Range Goal Priority:  High Start Date:  10/29/2020                           Expected End Date:  05/01/2022                     Follow Up within 90 days   -check blood sugar three times daily: Before breakfast, before supper, and at bedtime - check blood sugar if I feel it is too high or too low - enter blood sugar readings and medication or insulin into daily log    Why is this important?   Checking your blood sugar at home helps to keep it from getting very high or very low.  Writing the results in a diary or log helps the doctor know how to care for you.  Your blood sugar log should have the time, date and the results.  Also, write down the amount of insulin or other medicine that you take.  Other information, like what you ate, exercise done and how you were feeling, will also be helpful.     Notes:      Track and Manage My Blood Pressure-Hypertension   On track    Timeframe:  Long-Range Goal Priority:  High Start Date:  12/17/2020                            Expected End Date:  06/18/2022                    Follow Up within 90 days   - check blood pressure 3 times per week    Why is this important?   You won't feel high blood pressure, but it can still hurt your blood vessels.  High blood pressure can cause heart or kidney problems. It can also cause a stroke.  Making lifestyle changes like losing a little weight or eating less salt will help.  Checking your blood pressure at home and at different times of the day can help to control blood pressure.  If the doctor prescribes medicine remember to take it the way the doctor ordered.  Call the office if you cannot afford the medicine or if there are questions about it.      Notes:         Patient Care Plan: General Pharmacy (Adult)     Problem Identified: Hypertension, Hyperlipidemia, Diabetes, Atrial Fibrillation, Asthma, Chronic Kidney Disease and Gout   Priority: High     Long-Range Goal: Patient-Specific Goal   Start Date: 10/29/2020  Expected End Date: 04/16/2022  This Visit's Progress: On track  Recent Progress: On track  Priority: High  Note:   Current Barriers:  Unable to independently afford treatment regimen Suboptimal therapeutic regimen for diabetes  Pharmacist Clinical Goal(s):  Over the next 90 days, patient will maintain control of diabetes as evidenced by A1c less than 8%  through collaboration with PharmD and provider.   Interventions: 1:1 collaboration with Steele Sizer, MD regarding development and update of comprehensive plan of  care as evidenced by provider attestation and co-signature Inter-disciplinary care team collaboration (see longitudinal plan of care) Comprehensive medication review performed; medication list updated in electronic medical record  Hypertension (BP goal <130/80) -Controlled -Current treatment: Diltiazem CD 240 mg daily: Appropriate, Effective, Safe, Accessible  Furosemide 40 mg twice daily as needed: Appropriate, Effective, Query Safe Irbersartan 300 mg daily: Appropriate, Effective, Safe, Accessible  -Medications previously tried: NA  -Current home readings:    148/63, 72  144/66, 55   149/70, 68   149/68, 60   133,81, 67   140/70, 65   134/54, 65   151/63, 68   142/62, 68   155/66, 64   -Increased stress in setting of caring for husband, who was hospitalized.  -Current dietary habits: She has been eating less, trying to follow a low-carb, low-sodium diet.   -Current exercise habits: Unable to walk long distances.  -Denies hypotensive/hypertensive symptoms -Recommended to continue current medication  Atrial Fibrillation (Goal: prevent stroke and major bleeding) -Controlled:   -CHADSVASC: 5 -Current treatment: Rate control: Diltiazem CD 240 mg daily  Anticoagulation: Eliquis 5 mg twice daily  -Medications previously tried: NA -Home BP and HR readings: NA  -Counseled on avoidance of NSAIDs due to increased bleeding risk with anticoagulants; -Recommended to continue current medication  Hyperlipidemia: (LDL goal < 70) -Controlled -Current treatment: Pravastatin 80 mg daily  -Medications previously tried: NA  -Educated on Importance of limiting foods high in cholesterol; -Recommended to continue current medication  Diabetes (A1c goal <8%) -Not ideally controlled -Current medications: Farxiga 10 mg daily  Trulicity 1.5 mg weekly: Appropriate, Effective, Safe, Accessible   Humalog 75-25 6 units before breakfast, 6 units before supper: Appropriate, Effective, Safe, Accessible  -Medications previously tried: Metformin  -Current home glucose readings:   Breakfast  21-Jun 145  19-Jun 152  18-Jun 154  17-Jun 137  16-Jun 138  15-Jun 144  14-Jun 142  13-Jun 129  12-Jun 142  11-Jun 129  Average 141   -Denies hypoglycemic symptoms -Tolerating Farxiga well, does report some lightheadedness which has been gradually improving. Counseled patient on diuretic effect of Farxiga and to ensure adequate hydration.  -INCREASE Humalog to 6 units before breakfast, 8 units before supper.  -Recommend rechecking microalbumin at PCP follow-up.   Asthma (Goal: control symptoms and prevent exacerbations) -Controlled -Current treatment  Proventil HFA 2 puffs every 6 hours as needed Advair 1 puff twice daily  -Medications previously tried: NA  -Exacerbations requiring treatment in last 6 months: None -Patient reports consistent use of maintenance inhaler -Frequency of rescue inhaler use: NA -Counseled on When to use rescue inhaler -Recommended to continue current medication  Chronic Kidney Disease Stage 3a  -All medications assessed for renal dosing and  appropriateness in chronic kidney disease. -Recommended to continue current medication  Patient Goals/Self-Care Activities Over the next 90 days, patient will:  - check glucose 3 times daily, before breakfast, supper, and bedtime, document, and provide at future appointments check blood pressure 2-3 times weekly, document, and provide at future appointments  Follow Up Plan: Telephone follow up appointment with care management team member scheduled for:  03/31/2022 at 9:15 AM    Patient agreed to services and verbal consent obtained.   Patient verbalizes understanding of instructions and care plan provided today and agrees to view in [place]. Active MyChart status and patient understanding of how to access instructions and care plan via MyChart confirmed with patient.     Malva Limes, CPP Clinical Pharmacist Practitioner  [facility] [phone]

## 2022-02-10 NOTE — Assessment & Plan Note (Signed)
-  INCREASE Humalog to 6 units before breakfast, 8 units before supper.  -Recommend rechecking microalbumin at PCP follow-up.

## 2022-02-15 ENCOUNTER — Other Ambulatory Visit: Payer: Self-pay

## 2022-02-15 DIAGNOSIS — E1122 Type 2 diabetes mellitus with diabetic chronic kidney disease: Secondary | ICD-10-CM

## 2022-02-15 MED ORDER — BD SWAB SINGLE USE REGULAR PADS: 1.0000 | MEDICATED_PAD | 3 refills | Status: DC

## 2022-02-19 ENCOUNTER — Ambulatory Visit: Payer: Self-pay | Admitting: *Deleted

## 2022-02-19 ENCOUNTER — Encounter: Payer: Self-pay | Admitting: Internal Medicine

## 2022-02-19 ENCOUNTER — Ambulatory Visit (INDEPENDENT_AMBULATORY_CARE_PROVIDER_SITE_OTHER): Payer: Medicare PPO | Admitting: Internal Medicine

## 2022-02-19 VITALS — BP 160/83 | HR 102 | Temp 97.6°F | Resp 16 | Ht 70.0 in | Wt 351.2 lb

## 2022-02-19 DIAGNOSIS — E1159 Type 2 diabetes mellitus with other circulatory complications: Secondary | ICD-10-CM

## 2022-02-19 DIAGNOSIS — I152 Hypertension secondary to endocrine disorders: Secondary | ICD-10-CM

## 2022-02-19 DIAGNOSIS — E8779 Other fluid overload: Secondary | ICD-10-CM | POA: Diagnosis not present

## 2022-02-19 DIAGNOSIS — E1122 Type 2 diabetes mellitus with diabetic chronic kidney disease: Secondary | ICD-10-CM | POA: Diagnosis not present

## 2022-02-19 DIAGNOSIS — R0602 Shortness of breath: Secondary | ICD-10-CM | POA: Diagnosis not present

## 2022-02-19 DIAGNOSIS — I4891 Unspecified atrial fibrillation: Secondary | ICD-10-CM | POA: Diagnosis not present

## 2022-02-19 DIAGNOSIS — I129 Hypertensive chronic kidney disease with stage 1 through stage 4 chronic kidney disease, or unspecified chronic kidney disease: Secondary | ICD-10-CM

## 2022-02-19 DIAGNOSIS — J45909 Unspecified asthma, uncomplicated: Secondary | ICD-10-CM | POA: Diagnosis not present

## 2022-02-19 DIAGNOSIS — Z794 Long term (current) use of insulin: Secondary | ICD-10-CM

## 2022-02-19 DIAGNOSIS — N1831 Chronic kidney disease, stage 3a: Secondary | ICD-10-CM | POA: Diagnosis not present

## 2022-02-19 DIAGNOSIS — T7840XA Allergy, unspecified, initial encounter: Secondary | ICD-10-CM | POA: Diagnosis not present

## 2022-02-19 DIAGNOSIS — Z7984 Long term (current) use of oral hypoglycemic drugs: Secondary | ICD-10-CM

## 2022-02-19 MED ORDER — BENADRYL ITCH STOPPING 1-0.1 % EX CREA: TOPICAL_CREAM | Freq: Three times a day (TID) | CUTANEOUS | 0 refills | Status: DC | PRN

## 2022-02-19 MED ORDER — DIPHENHYDRAMINE HCL 25 MG PO TABS: 25.0000 mg | ORAL_TABLET | Freq: Four times a day (QID) | ORAL | 0 refills | Status: DC | PRN

## 2022-02-19 MED ORDER — LORATADINE 10 MG PO TABS: 10.0000 mg | ORAL_TABLET | Freq: Every day | ORAL | 11 refills | Status: DC

## 2022-02-19 NOTE — Telephone Encounter (Signed)
  Chief Complaint: rash, some swelling, SOB when walking Symptoms: for one week Frequency: one week Pertinent Negatives: Patient denies fever Disposition: '[]'$ ED /'[]'$ Urgent Care (no appt availability in office) / '[x]'$ Appointment(In office/virtual)/ '[]'$  [place] [facility]/ '[]'$ Home Care/ '[]'$ Refused Recommended Disposition /'[]'$ [facility] [facility]/ '[]'$  Follow-up with PCP Additional Notes: Office has appt this afternoon at 340. Pt lives very close to office. Had symptoms for a week but wants to have it checked before the weekend.   Reason for Disposition  [1] Purple or blood-colored rash (spots or dots) AND [2] no fever AND [3] sounds well to triager  Protocols used: Rash - Widespread On Drugs-A-AH

## 2022-02-19 NOTE — Patient Instructions (Addendum)
It was great seeing you today!  Plan discussed at today's visit: -Stop Wilder Glade  -Can do over the counter Benadryl cream for the itching, can also do oral Benadryl, can also do Allegra, Claritin or Zyrtec  -Take Lasix 40 mg today, tomorrow and next day, then do daily weights, if you gain >2 pounds in 24 hours take another Lasix 40 mg.  -If blood pressure high >200/100 and you have symptoms, so dizziness, headaches, chest pain then go to the ER   Follow up in: already scheduled in July   Take care and let us know if you have any questions or concerns prior to your next visit.  Dr. Rosana Berger

## 2022-02-19 NOTE — Progress Notes (Signed)
Acute Office Visit  Subjective:     Patient ID: Amber Reyes, female    DOB: [birth year], 76 y.o.   MRN: [id number]  Chief Complaint  Patient presents with   Medication Reaction    To farxiga: rash, itching,  swelling and SOB since starting med    HPI Patient is in today for reaction to [place].   Was started on medication end of May. Started experiencing rash on arms and around neck that is itchy, swelling in legs and shortness of breath. She has been short of breath for some time now, at least 2 months on and off. Worse since starting the Farxiga. Denies cough, wheezing but is very limited in activities. Cannot walk in the grocery store and needs to use and electric wheelchair to get around.   Does have a history of asthma, OSA and A.fib. Sees Cardiology, last seen on 10/29/2021.  Has had an echo on 08/04/2018, which showed a normal EF of 60 to 65% at that time.  She does have a CPAP which she is compliant with, however she recently got a new machine that sometimes she cannot tolerate throughout the entire night.  Her last sleep study was several years ago.  She is on Eliquis 5 mg daily for her A-fib and is also on Cardizem 240 mg.  She also has Lasix 40 mg twice daily to take as needed for swelling.  She did take a tablet yesterday but none today so far.  She does have an albuterol inhaler to use as needed for asthma.  For her diabetes, she is currently on Humalog 75/25 units - recently reduced to 6 units in the morning and 8 at night after the Wilder Glade was started.  Fasting blood sugar ranging from 120-150 since being on this regimen.  She denies hypoglycemic events.  Last A1c 7.6% in 3/23.    Review of Systems  Constitutional:  Negative for chills and fever.  Respiratory:  Positive for shortness of breath. Negative for cough and wheezing.   Cardiovascular:  Positive for leg swelling. Negative for chest pain.  Skin:  Positive for rash.        Objective:    BP (!) 188/62    Pulse (!) 102   Temp 97.6 F (36.4 C)   Resp 16   Ht '5\' 10"'$  (1.778 m)   Wt (!) 351 lb 3.2 oz (159.3 kg)   SpO2 98%   BMI 50.39 kg/m  BP Readings from Last 3 Encounters:  11/11/21 138/82  10/29/21 130/60  09/29/21 116/65   Wt Readings from Last 3 Encounters:  02/19/22 (!) 351 lb 3.2 oz (159.3 kg)  11/11/21 (!) 353 lb (160.1 kg)  10/29/21 (!) 346 lb 4 oz (157.1 kg)      Physical Exam Constitutional:      Appearance: Normal appearance. She is obese.  HENT:     Head: Normocephalic and atraumatic.  Eyes:     Conjunctiva/sclera: Conjunctivae normal.  Cardiovascular:     Rate and Rhythm: Normal rate and regular rhythm.  Pulmonary:     Effort: Pulmonary effort is normal.     Breath sounds: Normal breath sounds. No wheezing, rhonchi or rales.  Musculoskeletal:     Right lower leg: Edema present.     Left lower leg: Edema present.     Comments: 3+ pitting edema bilateral lower extremities  Skin:    General: Skin is warm and dry.     Comments: Fine erythematous itching rash on  bilateral forearms and neck.   Neurological:     General: No focal deficit present.     Mental Status: She is alert. Mental status is at baseline.  Psychiatric:        Mood and Affect: Mood normal.        Behavior: Behavior normal.     No results found for any visits on 02/19/22.      Assessment & Plan:   1. Shortness of breath/Other hypervolemia: Fluid overloaded on exam, 3+ pitting edema. Also has CKD, last creatinine 1.31, GFR 43 on labs in March. Takes Lasix 40 mg as needed. Will increase Lasix to 40 mg for 3 days, then do daily weights. If gained more than 2 pounds in 24 hours, will take another 40 mg dose. Fluid restrict. Recommend discussing with her Cardiologist about getting updated echo.   2. Allergic reaction, initial encounter: Uncertain this was a true reaction but will discontinue [place]. Used Benadryl cream as needed for rash, can also take oral Benadryl and Claritin for itching.    - diphenhydrAMINE-zinc acetate (BENADRYL ITCH STOPPING) cream; Apply topically 3 (three) times daily as needed for itching.  Dispense: 28.3 g; Refill: 0 - loratadine (CLARITIN) 10 MG tablet; Take 1 tablet (10 mg total) by mouth daily.  Dispense: 30 tablet; Refill: 11 - diphenhydrAMINE (BENADRYL ALLERGY) 25 MG tablet; Take 1 tablet (25 mg total) by mouth every 6 (six) hours as needed.  Dispense: 30 tablet; Refill: 0  3. Hypertension associated with type 2 diabetes mellitus ([place]): Blood pressure high in the office today, high on recheck. Increase diuretic, continue to monitor at home. Stop Farxiga, continue Humalog 75/25 units 6 mg units in morning and 8 units at night. Follow up here in 3 weeks.   Return for already scheduled .  Teodora Medici, DO

## 2022-02-21 ENCOUNTER — Other Ambulatory Visit: Payer: Self-pay | Admitting: Family Medicine

## 2022-02-21 DIAGNOSIS — E1122 Type 2 diabetes mellitus with diabetic chronic kidney disease: Secondary | ICD-10-CM

## 2022-02-22 ENCOUNTER — Other Ambulatory Visit: Payer: Self-pay | Admitting: Family Medicine

## 2022-02-22 ENCOUNTER — Encounter: Payer: Self-pay | Admitting: Family Medicine

## 2022-02-25 ENCOUNTER — Encounter: Payer: Self-pay | Admitting: Internal Medicine

## 2022-02-25 ENCOUNTER — Ambulatory Visit: Payer: Medicare PPO | Admitting: Internal Medicine

## 2022-02-25 ENCOUNTER — Ambulatory Visit: Payer: Self-pay

## 2022-02-25 ENCOUNTER — Telehealth: Payer: Self-pay

## 2022-02-25 VITALS — BP 172/72 | HR 100 | Temp 97.8°F | Resp 20 | Ht 70.0 in | Wt 354.5 lb

## 2022-02-25 DIAGNOSIS — R0602 Shortness of breath: Secondary | ICD-10-CM

## 2022-02-25 DIAGNOSIS — I119 Hypertensive heart disease without heart failure: Secondary | ICD-10-CM

## 2022-02-25 DIAGNOSIS — Z794 Long term (current) use of insulin: Secondary | ICD-10-CM

## 2022-02-25 DIAGNOSIS — N1832 Chronic kidney disease, stage 3b: Secondary | ICD-10-CM | POA: Diagnosis not present

## 2022-02-25 DIAGNOSIS — E1165 Type 2 diabetes mellitus with hyperglycemia: Secondary | ICD-10-CM

## 2022-02-25 NOTE — Telephone Encounter (Signed)
Appt made for 02-25-2022

## 2022-02-25 NOTE — Telephone Encounter (Signed)
  Chief Complaint: medication assistance Symptoms: BS running higher than normal, 150-160s, dizziness and lightheaded in the mornings Frequency: since 02/19/22 Pertinent Negatives: NA Disposition: '[]'$ ED /'[]'$ Urgent Care (no appt availability in office) / '[]'$ Appointment(In office/virtual)/ '[]'$  [place]/ '[]'$ Home Care/ '[]'$ Refused Recommended Disposition /'[]'$ [facility]/ '[x]'$  Follow-up with PCP Additional Notes: pt called, was asking about did she need to increase insulin dosage since her BS are running higher than normal since the [place] was dc'd. I advised pt to continue with the 6 units for now and I would have someone fu with her regarding this. Pt said she is feeling better but still having lightheadedness during the mornings.   Summary: medication questions with side effects   Pt called asking for a nurse to call her back regarding her meds and some side effects she has been having.  She said she spoke with a nurse Monday.  Blood sugar is a little higher this morning.   CB#  [phone]      Reason for Disposition  [1] Caller has URGENT medicine question about med that PCP or specialist prescribed AND [2] triager unable to answer question  Answer Assessment - Initial Assessment Questions 1. NAME of MEDICATION: "What medicine are you calling about?"     Wilder Glade was dc'd 2. QUESTION: "What is your question?" (e.g., double dose of medicine, side effect)     Wanting to know if she needs to increase  3. PRESCRIBING HCP: "Who prescribed it?" Reason: if prescribed by specialist, call should be referred to that group.     [place] 4. SYMPTOMS: "Do you have any symptoms?"     Dizziness and just feeling off 5. SEVERITY: If symptoms are present, ask "Are they mild, moderate or severe?"     mild  Protocols used: Medication Question Call-A-AH

## 2022-02-25 NOTE — Progress Notes (Signed)
Chronic Care Management Pharmacy Assistant   Name: Amber Reyes  MRN: [id number] DOB: 25-Aug-1945  Reason for Encounter: Medication Management  Patient called to inform that she had a serious reaction to the [place]. Per patient she was experiencing shortness of breath, swelling, and a rash. Patient saw PCP on 02/19/2022 and was provided medication to assist her with the rash, swelling and the Wilder Glade has been discontinued.  The patient reached out to me this morning as since discontinuing the [place] her blood sugar numbers have been elevated. The patient stated her fasting blood sugar highest has been 170 and this morning her high was 161. I reached out to CPP and he instructed me to inform the patient to resume her 20 units of Humalog twice daily for breakfast and dinner. Patient has taken 6 units this morning and I instructed her to take and additional 4 units to equal the 10 and take 10 units tonight.  The patient has a PCP follow-up today regarding her high numbers as well. I advised the patient per CPP to keep this appointment and call me afterwards with any changes other than resuming the 10 units twice daily that PCP makes. I also instructed the patient that I would be following up with her in 1 weeks. Patient also advised that I will be calling [employer] to unenroll her from the program so they will not send her any additional [place]. Patient verbalized understanding to all.    Medications: Outpatient Encounter Medications as of 02/25/2022  Medication Sig   acetaminophen (TYLENOL) 650 MG CR tablet Take 650 mg by mouth in the morning and at bedtime.   albuterol (PROVENTIL HFA;VENTOLIN HFA) 108 (90 BASE) MCG/ACT inhaler Inhale 2 puffs into the lungs every 6 (six) hours as needed for wheezing or shortness of breath.   Alcohol Swabs (B-D SINGLE USE SWABS REGULAR) PADS 1 Box by Does not apply route every 30 (thirty) days.   allopurinol (ZYLOPRIM) 100 MG tablet Take 1 tablet (100 mg  total) by mouth 2 (two) times daily.   apixaban (ELIQUIS) 5 MG TABS tablet Take 1 tablet (5 mg total) by mouth 2 (two) times daily.   Blood Glucose Calibration (TRUE METRIX LEVEL 1) Low SOLN 1 each by In Vitro route once a week.   Blood Glucose Monitoring Suppl (TRUE METRIX AIR GLUCOSE METER) w/Device KIT Inject 1 each into the skin 4 (four) times daily as needed. Check fsbs 4 times daily E11.22   Cholecalciferol (VITAMIN D) 2000 UNITS tablet Take 2,000 Units by mouth daily.   colchicine (COLCRYS) 0.6 MG tablet Take 1-2 tablets (0.6-1.2 mg total) by mouth daily as needed.   dapagliflozin propanediol (FARXIGA) 10 MG TABS tablet Take 1 tablet (10 mg total) by mouth daily before breakfast. Via [employer] Patient Assistance   diltiazem (CARDIZEM CD) 240 MG 24 hr capsule TAKE 1 CAPSULE EVERY DAY   diphenhydrAMINE (BENADRYL ALLERGY) 25 MG tablet Take 1 tablet (25 mg total) by mouth every 6 (six) hours as needed.   diphenhydrAMINE-zinc acetate (BENADRYL ITCH STOPPING) cream Apply topically 3 (three) times daily as needed for itching.   DROPLET PEN NEEDLES 30G X 8 MM MISC USE TWICE DAILY WITH INSULIN   Dulaglutide (TRULICITY) 1.5 WU/9.8JX SOPN Inject 1.5 mg into the skin once a week.   Elastic Bandages & Supports (MEDICAL COMPRESSION STOCKINGS) MISC 2 each by Does not apply route daily.   fluticasone (FLONASE) 50 MCG/ACT nasal spray Place 2 sprays into both nostrils daily.  fluticasone-salmeterol (ADVAIR) 250-50 MCG/ACT AEPB INHALE 1 PUFF BY MOUTH TWICE DAILY   furosemide (LASIX) 40 MG tablet TAKE 1 TABLET (40 MG TOTAL) BY MOUTH 2 (TWO) TIMES DAILY AS NEEDED.   Insulin Lispro Prot & Lispro (HUMALOG MIX 75/25 KWIKPEN) (75-25) 100 UNIT/ML Kwikpen Inject 6 Units into the skin 2 (two) times daily with a meal.   irbesartan (AVAPRO) 300 MG tablet Take 1 tablet (300 mg total) by mouth daily.   loratadine (CLARITIN) 10 MG tablet Take 1 tablet (10 mg total) by mouth daily.   potassium chloride SA (KLOR-CON) 20 MEQ  tablet Take 1 tablet (20 mEq total) by mouth daily. With furosemide   pravastatin (PRAVACHOL) 80 MG tablet Take 1 tablet (80 mg total) by mouth every evening.   TRUE METRIX BLOOD GLUCOSE TEST test strip CHECK BLOOD SUGAR FOUR TIMES DAILY   TRUEplus Lancets 33G MISC TEST BLOOD SUGAR THREE TIMES DAILY AS NEEDED   TYLENOL 325 MG tablet    No facility-administered encounter medications on file as of 02/25/2022.    [name], CPA/CMA Clinical Pharmacist Assistant Phone: [phone]

## 2022-02-25 NOTE — Progress Notes (Signed)
Acute Office Visit  Subjective:     Patient ID: Amber Reyes, female    DOB: [birth year], 76 y.o.   MRN: [id number]  Chief Complaint  Patient presents with   Diabetes    Sugars running high since stopped farxiga due to reaction.  She spoke to pharmacist alex who told her to increase humalog to 10 units am and pm   Hypertension   Dizziness    This am    HPI Patient is in today for blood sugar high since stopping [place].   Diabetes, Type 2: -Last A1c 7.6% 3/23 -Medications: Humalog 75/25 units - recently reduced to 6 units in the morning and 8 at night when she was put on the Farxiga but as this was discontinued last week after having an allergic reaction (rash). Her insulin was then increased to 10 units in the morning and 10 at night (what it had been previously) but she hasn't started this yet, did take 10 units this morning. Also on Trulicity 1.5 mg.  -Patient is compliant with the above medications and reports no side effects.  -Checking BG at home: fasting 125-131 over the weekend but then this week 156-175. No lows.   Hypertension/A.fib/OSA/BLE Edema: -Medications: Eliquis 5 mg and Cardizem 240 mg, Irbesartan 300 mg, Lasix increased to 40 mg daily for 3 days (usually takes as needed for swelling) -Patient is compliant with above medications and reports no side effects. -Checking BP at home (average): 155/83 this morning  -Denies CP, vision changes. Shortness of breath has been better over the weekend, edema a little bit better too  -Sees Cardiology, last seen on 10/29/2021.  Has had an echo on 08/04/2018, which showed a normal EF of 60 to 65% at that time. -Compliant with CPAP.  Review of Systems  Constitutional:  Negative for chills and fever.  Eyes:  Negative for blurred vision.  Respiratory:  Positive for shortness of breath. Negative for cough and wheezing.   Cardiovascular:  Positive for leg swelling. Negative for chest pain and palpitations.  Neurological:   Negative for headaches.       Objective:    BP (!) 172/72   Pulse 100   Temp 97.8 F (36.6 C)   Resp 20   Ht '5\' 10"'$  (1.778 m)   Wt (!) 354 lb 8 oz (160.8 kg)   SpO2 98%   BMI 50.87 kg/m  BP Readings from Last 3 Encounters:  02/25/22 (!) 172/72  02/19/22 (!) 160/83  11/11/21 138/82   Wt Readings from Last 3 Encounters:  02/25/22 (!) 354 lb 8 oz (160.8 kg)  02/19/22 (!) 351 lb 3.2 oz (159.3 kg)  11/11/21 (!) 353 lb (160.1 kg)     Physical Exam Constitutional:      Appearance: Normal appearance. She is obese.  HENT:     Head: Normocephalic and atraumatic.  Eyes:     Conjunctiva/sclera: Conjunctivae normal.  Cardiovascular:     Rate and Rhythm: Normal rate and regular rhythm.  Pulmonary:     Effort: Pulmonary effort is normal.     Breath sounds: Normal breath sounds. No wheezing, rhonchi or rales.  Musculoskeletal:     Comments: 2+ pitting edema in BLE  Skin:    General: Skin is warm and dry.  Neurological:     General: No focal deficit present.     Mental Status: She is alert. Mental status is at baseline.  Psychiatric:        Mood and Affect: Mood normal.  Behavior: Behavior normal.     No results found for any visits on 02/25/22.      Assessment & Plan:   1. 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 Has been taking Lasix 40 mg daily. Will check kidney function and BNP today, concern for worsening heart failure. Start Lasix 40 mg daily for now until labs back. Recommend she call Cardiology and set up a repeat echo. Blood pressure high here today, high on recheck as well but she was crying and upset. Lasix will help with blood pressure but needs to be checking daily. Follow up scheduled here on 03/16/22. Restrict fluid <2 L and sodium < 2 grams daily.   - Basic Metabolic Panel (BMET) - B Nat Peptide  2.  Type 2 diabetes mellitus with hyperglycemia, with long-term current use of insulin ([place]): Sugars had been controlled but starting to increase after discontinuing [place]. Agree with Humalog 75/25 10 units BID, she will take another 10 units today. Continue Trulicity.    Return for already scheduled .  Teodora Medici, DO

## 2022-02-25 NOTE — Patient Instructions (Addendum)
It was great seeing you today!  Plan discussed at today's visit: -Blood work ordered today, results will be uploaded to MyChart.  -Take Lasix 40 mg daily -Restrict all fluids < 2 L daily and sodium < 2 grams a day -Call Cardiology about repeating echo  -Continue Humalog 75/25 10 units twice daily and Trulicity  -Continue to check sugars and blood pressure daily at home   Follow up in: 03/16/22  Take care and let us know if you have any questions or concerns prior to your next visit.  Dr. Rosana Berger

## 2022-02-26 ENCOUNTER — Encounter: Payer: Self-pay | Admitting: Physician Assistant

## 2022-02-26 ENCOUNTER — Ambulatory Visit: Payer: Medicare PPO | Admitting: Cardiology

## 2022-02-26 ENCOUNTER — Telehealth: Payer: Self-pay | Admitting: Cardiovascular Disease

## 2022-02-26 VITALS — BP 170/78 | HR 77 | Ht 70.0 in | Wt 350.4 lb

## 2022-02-26 DIAGNOSIS — I4821 Permanent atrial fibrillation: Secondary | ICD-10-CM

## 2022-02-26 DIAGNOSIS — I10 Essential (primary) hypertension: Secondary | ICD-10-CM | POA: Diagnosis not present

## 2022-02-26 DIAGNOSIS — I5032 Chronic diastolic (congestive) heart failure: Secondary | ICD-10-CM

## 2022-02-26 DIAGNOSIS — E785 Hyperlipidemia, unspecified: Secondary | ICD-10-CM

## 2022-02-26 DIAGNOSIS — R0602 Shortness of breath: Secondary | ICD-10-CM | POA: Diagnosis not present

## 2022-02-26 LAB — BASIC METABOLIC PANEL
BUN/Creatinine Ratio: 24 (calc) — ABNORMAL HIGH (ref 6–22)
BUN: 31 mg/dL — ABNORMAL HIGH (ref 7–25)
CO2: 26 mmol/L (ref 20–32)
Calcium: 8.9 mg/dL (ref 8.6–10.4)
Chloride: 106 mmol/L (ref 98–110)
Creat: 1.27 mg/dL — ABNORMAL HIGH (ref 0.60–1.00)
Glucose, Bld: 159 mg/dL — ABNORMAL HIGH (ref 65–99)
Potassium: 4.3 mmol/L (ref 3.5–5.3)
Sodium: 140 mmol/L (ref 135–146)

## 2022-02-26 LAB — BRAIN NATRIURETIC PEPTIDE: Brain Natriuretic Peptide: 61 pg/mL (ref <100)

## 2022-02-26 NOTE — Progress Notes (Signed)
Cardiology Clinic Note   Patient Name: Amber Reyes Date of Encounter: 02/26/2022  Primary Care Provider:  Steele Sizer, MD Primary Cardiologist:  Kathlyn Sacramento, MD  Patient Profile    76 year old female with past medical history of chronic diastolic heart failure, chronic atrial fibrillation, essential hypertension, type 2 diabetes, chronic kidney disease, sleep apnea, history of pericardial effusion years ago requiring pericardial window, and morbid obesity here following up on shortness of breath.  Of note she was recently started on Farxiga by her PCP and had a questionable allergic reaction to medication had to be stopped.  Past Medical History    Past Medical History:  Diagnosis Date   Allergic rhinitis, cause unspecified    Anxiety    Chronic diastolic CHF (congestive heart failure) (HCC)    CKD (chronic kidney disease), stage III (HCC)    Edema    Essential hypertension, benign    Gout, unspecified    Heart murmur    as child   Hyperlipidemia    Lipoma of unspecified site    Other and unspecified disc disorder of lumbar region    Other general symptoms(780.99)    PAC (premature atrial contraction)    Renal insufficiency    Restless legs syndrome (RLS)    Sebaceous cyst    Super obesity    Type II or unspecified type diabetes mellitus without mention of complication, uncontrolled    Unspecified asthma(493.90)    Unspecified disease of pericardium    Unspecified sleep apnea    Unspecified vitamin D deficiency    Vaginitis and vulvovaginitis, unspecified    Venous insufficiency    Past Surgical History:  Procedure Laterality Date   CARDIAC CATHETERIZATION  2002   [facility]   CATARACT EXTRACTION W/PHACO Right 09/15/2021   Procedure: CATARACT EXTRACTION PHACO AND INTRAOCULAR LENS PLACEMENT (IOC) RIGHT 10.19 01:01.8;  Surgeon: Birder Robson, MD;  Location: [place];  Service: Ophthalmology;  Laterality: Right;   CATARACT EXTRACTION W/PHACO  Left 09/29/2021   Procedure: CATARACT EXTRACTION PHACO AND INTRAOCULAR LENS PLACEMENT (IOC) LEFT DIABETIC 11.40 01:21.2;  Surgeon: Birder Robson, MD;  Location: [place];  Service: Ophthalmology;  Laterality: Left;   COLONOSCOPY WITH PROPOFOL N/A 09/25/2018   Procedure: COLONOSCOPY WITH PROPOFOL;  Surgeon: Jonathon Bellows, MD;  Location: [facility] ENDOSCOPY;  Service: Gastroenterology;  Laterality: N/A;   COLONOSCOPY WITH PROPOFOL N/A 04/13/2019   Procedure: COLONOSCOPY WITH PROPOFOL;  Surgeon: Jonathon Bellows, MD;  Location: [facility] ENDOSCOPY;  Service: Gastroenterology;  Laterality: N/A;   COLONOSCOPY WITH PROPOFOL N/A 06/27/2020   Procedure: COLONOSCOPY WITH PROPOFOL;  Surgeon: Jonathon Bellows, MD;  Location: [facility] ENDOSCOPY;  Service: Gastroenterology;  Laterality: N/A;   PERICARDIUM SURGERY      Allergies  No Known Allergies  History of Present Illness    76 year old female with the previously mentioned complex medical history including chronic diastolic congestive heart failure, chronic atrial fibrillation, DMII, hypertension, chronic kidney disease, sleep apnea, h/o pericardial effusion several years ago requiring pericardial window, and mordid obesity.  Patient's cardiac history started when she was hospitalized in 2000 to [facility] for pericardial effusion which required drainage.  She remembered having a cardiac catheterization at that point time was told she had no significant coronary artery disease.  She did have atrial fibrillation and was started on chronic anticoagulant of Eliquis and has tolerated it well without any bleeding.  In 2015 she started following with Dr. Inocencio Homes for complaints of substernal chest tightness which lasted almost  all the day with rest worse with physical activities she also has chronic exertional dyspnea with minimal activities.  At that time in 2015 she had an echocardiogram that revealed an EF of 6065% without wall motion abnormalities she had a mild  MR and trivial pericardial effusion at that time that was identified. In 04/2016 she was admitted for shortness of breath and worsening edema. She was she was diuresed and started on Lasix 40 mg twice daily for new onset diastolic congestive heart failure.  Echocardiogram was completed that showed LVEF 55% without wall motion abnormalities, grade 1 diastolic dysfunction, left atrium was mildly dilated.  The patient was last seen in the office on 10/29/2021 by Dr. Mariea Clonts and was doing well with no changes need to be made at that time. She is back in clinic today with worsening shortness of breath and peripheral edema.  She is also having allergic reaction to [place] and had to take Benadryl and loratadine.  She states he has been off the medication for approximately a week at this point time the rash is improved shortness of breath is slowly improving and edema slowly improving but she is back on her furosemide 40 mg daily at this time.  She denies any chest pain, chest pressure or palpitations.  Denies any recent hospitalizations or visits to the emergency department. Home Medications    Current Outpatient Medications  Medication Sig Dispense Refill   acetaminophen (TYLENOL) 650 MG CR tablet Take 650 mg by mouth in the morning and at bedtime.     albuterol (PROVENTIL HFA;VENTOLIN HFA) 108 (90 BASE) MCG/ACT inhaler Inhale 2 puffs into the lungs every 6 (six) hours as needed for wheezing or shortness of breath.     Alcohol Swabs (B-D SINGLE USE SWABS REGULAR) PADS 1 Box by Does not apply route every 30 (thirty) days. 100 each 3   allopurinol (ZYLOPRIM) 100 MG tablet Take 1 tablet (100 mg total) by mouth 2 (two) times daily. 180 tablet 1   apixaban (ELIQUIS) 5 MG TABS tablet Take 1 tablet (5 mg total) by mouth 2 (two) times daily. 60 tablet 0   Blood Glucose Calibration (TRUE METRIX LEVEL 1) Low SOLN 1 each by In Vitro route once a week. 1 each 1   Blood Glucose Monitoring Suppl (TRUE METRIX AIR GLUCOSE METER)  w/Device KIT Inject 1 each into the skin 4 (four) times daily as needed. Check fsbs 4 times daily E11.22 1 kit 0   Cholecalciferol (VITAMIN D) 2000 UNITS tablet Take 2,000 Units by mouth daily.     colchicine (COLCRYS) 0.6 MG tablet Take 1-2 tablets (0.6-1.2 mg total) by mouth daily as needed. 30 tablet 0   diltiazem (CARDIZEM CD) 240 MG 24 hr capsule TAKE 1 CAPSULE EVERY DAY 90 capsule 0   diphenhydrAMINE (BENADRYL ALLERGY) 25 MG tablet Take 1 tablet (25 mg total) by mouth every 6 (six) hours as needed. 30 tablet 0   diphenhydrAMINE-zinc acetate (BENADRYL ITCH STOPPING) cream Apply topically 3 (three) times daily as needed for itching. 28.3 g 0   DROPLET PEN NEEDLES 30G X 8 MM MISC USE TWICE DAILY WITH INSULIN 200 each 3   Dulaglutide (TRULICITY) 1.5 YK/9.9IP SOPN Inject 1.5 mg into the skin once a week. 6 mL 3   Elastic Bandages & Supports (MEDICAL COMPRESSION STOCKINGS) MISC 2 each by Does not apply route daily. 2 each 2   fluticasone (FLONASE) 50 MCG/ACT nasal spray Place 2 sprays into both nostrils daily. 48 g 2  fluticasone-salmeterol (ADVAIR) 250-50 MCG/ACT AEPB INHALE 1 PUFF BY MOUTH TWICE DAILY 180 each 1   furosemide (LASIX) 40 MG tablet TAKE 1 TABLET (40 MG TOTAL) BY MOUTH 2 (TWO) TIMES DAILY AS NEEDED. 180 tablet 1   Insulin Lispro Prot & Lispro (HUMALOG MIX 75/25 KWIKPEN) (75-25) 100 UNIT/ML Kwikpen Inject 6 Units into the skin 2 (two) times daily with a meal. (Patient taking differently: Inject 10 Units into the skin 2 (two) times daily with a meal.) 12 mL 0   irbesartan (AVAPRO) 300 MG tablet Take 1 tablet (300 mg total) by mouth daily. 90 tablet 1   loratadine (CLARITIN) 10 MG tablet Take 1 tablet (10 mg total) by mouth daily. 30 tablet 11   potassium chloride SA (KLOR-CON) 20 MEQ tablet Take 1 tablet (20 mEq total) by mouth daily. With furosemide 90 tablet 1   pravastatin (PRAVACHOL) 80 MG tablet Take 1 tablet (80 mg total) by mouth every evening. 90 tablet 1   TRUE METRIX BLOOD  GLUCOSE TEST test strip CHECK BLOOD SUGAR FOUR TIMES DAILY 400 strip 0   TRUEplus Lancets 33G MISC TEST BLOOD SUGAR THREE TIMES DAILY AS NEEDED 100 each 3   TYLENOL 325 MG tablet      No current facility-administered medications for this visit.     Family History    Family History  Problem Relation Age of Onset   Heart attack Mother    Hypertension Mother    Breast cancer Neg Hx    She indicated that her mother is deceased. She indicated that her father is deceased. She indicated that her daughter is alive. She indicated that the status of her neg hx is unknown.  Social History    Social History   Socioeconomic History   Marital status: Married    Spouse name: Not on file   Number of children: 3   Years of education: Not on file   Highest education level: Associate degree: academic program  Occupational History   Occupation: retired  Tobacco Use   Smoking status: Never   Smokeless tobacco: Never   Tobacco comments:    n/a  Vaping Use   Vaping Use: Never used  Substance and Sexual Activity   Alcohol use: No    Alcohol/week: 0.0 standard drinks of alcohol   Drug use: No   Sexual activity: Not Currently  Other Topics Concern   Not on file  Social History Narrative   Not on file   Social Determinants of Health   Financial Resource Strain: Low Risk  (01/06/2022)   Overall Financial Resource Strain (CARDIA)    Difficulty of Paying Living Expenses: Not hard at all  Food Insecurity: No Food Insecurity (06/23/2021)   Hunger Vital Sign    Worried About Running Out of Food in the Last Year: Never true    [place] in the Last Year: Never true  Transportation Needs: No Transportation Needs (06/23/2021)   PRAPARE - Hydrologist (Medical): No    Lack of Transportation (Non-Medical): No  Physical Activity: Inactive (06/23/2021)   Exercise Vital Sign    Days of Exercise per Week: 0 days    Minutes of Exercise per Session: 0 min  Stress: No  Stress Concern Present (06/23/2021)   [place]    Feeling of Stress : Not at all  Social Connections: [place] (06/23/2021)   Social Connection and Isolation Panel [NHANES]  Frequency of Communication with Friends and Family: More than three times a week    Frequency of Social Gatherings with Friends and Family: Once a week    Attends Religious Services: More than 4 times per year    Active Member of [employer] or Organizations: Yes    Attends Music therapist: More than 4 times per year    Marital Status: Married  Human resources officer Violence: Not At Risk (06/23/2021)   Humiliation, Afraid, Rape, and Kick questionnaire    Fear of Current or Ex-Partner: No    Emotionally Abused: No    Physically Abused: No    Sexually Abused: No     Review of Systems    General:  No chills, fever, night sweats or weight changes.  Cardiovascular:  No chest pain, orthopnea, palpitations, paroxysmal nocturnal dyspnea, endorses dyspnea on exertion, edema Dermatological: No rash, lesions/masses Respiratory: No cough, endorses dyspnea Urologic: No hematuria, dysuria Abdominal:   No nausea, vomiting, diarrhea, bright red blood per rectum, melena, or hematemesis Neurologic:  No visual changes, wkns, changes in mental status. All other systems reviewed and are otherwise negative except as noted above.    Physical Exam    VS:  BP (!) 170/78 (BP Location: Left Arm, Patient Position: Sitting, Cuff Size: Large)   Pulse 77   Ht _0  (1.778 m)   Wt (!) 350 lb 6 oz (158.9 kg)   SpO2 98%   BMI 50.27 kg/m  , BMI Body mass index is 50.27 kg/m.     GEN: Well nourished, well developed, in no acute distress. HEENT: normal. Neck: Supple, no JVD, carotid bruits, or masses. Cardiac: RRR, no murmurs, rubs, or gallops. No clubbing, cyanosis, 2+ edema to the bilateral lower extremities.  Radials/DP/PT 2+ and equal bilaterally.   Respiratory:  Respirations regular and unlabored, clear to auscultation bilaterally. GI: Soft, nontender, nondistended, BS + x 4. MS: no deformity or atrophy. Skin: warm and dry, no rash. Neuro:  Strength and sensation are intact. Psych: Normal affect.  Accessory Clinical Findings    ECG personally reviewed by me today-rate controlled atrial fibrillation 70s and 80s with a left axis deviation- No acute changes  Lab Results  Component Value Date   WBC 8.9 07/14/2021   HGB 10.8 (L) 07/14/2021   HCT 33.4 (L) 07/14/2021   MCV 90.3 07/14/2021   PLT 228 07/14/2021   Lab Results  Component Value Date   CREATININE 1.27 (H) 02/25/2022   BUN 31 (H) 02/25/2022   NA 140 02/25/2022   K 4.3 02/25/2022   CL 106 02/25/2022   CO2 26 02/25/2022   Lab Results  Component Value Date   ALT 6 11/11/2021   AST 15 11/11/2021   ALKPHOS 90 12/23/2016   BILITOT 0.4 11/11/2021   Lab Results  Component Value Date   CHOL 134 07/14/2021   HDL 51 07/14/2021   LDLCALC 67 07/14/2021   TRIG 78 07/14/2021   CHOLHDL 2.6 07/14/2021    Lab Results  Component Value Date   HGBA1C 7.6 (H) 11/11/2021    Assessment & Plan   1.  Chronic diastolic congestive heart failure with increased shortness of breath and peripheral edema after an allergic reaction to [place].  Patient stated that Wilder Glade was started and stopped by her primary care provider.  She has had worsening shortness of breath and dyspnea on exertion since initiating of the medication.  BMP on 02/25/2022 was 61.  Last echocardiogram was completed 20 07/2018 with an EF  of 60 to 65% with mild MR.  She has been scheduled for an echocardiogram for shortness of breath. She is also been advised to continue her furosemide and irbesartan.  2.  Chronic persistent atrial fibrillation that is rate controlled today in the 70s and 80s she is continued on diltiazem 1 240 mg daily and Eliquis 5 mg twice daily  3.  Essential hypertension blood pressure slightly  elevated today at 174/78 as patient states she is extremely anxious about her appointment today as she is concerned there may be something detrimental going on with her heart with the reaction that she had with from CPR.  She has been advised to continue with her irbesartan 300 mg daily Lasix 40 mg twice daily as well as her potassium replacement since she has taken diuretic therapy as well as her diltiazem  4.  Hyperlipidemia with LDL 67 last checked on 07/14/2021 she is to continue with the pravastatin at this time is followed by PCP  Disposition follow-up with Dr. Fletcher Anon in 2 months after echocardiogram is completed to reevaluate symptoms.  Dolores Ewing, NP 02/26/2022, 3:40 PM

## 2022-02-26 NOTE — Telephone Encounter (Signed)
Pt states she went to see her PCP yesterday because she had an allergic reaction to farxiga, she states that she also saw her for SOB and swelling. Her PCP recommended that she see her cardiologist. She states that she does have some SOB when walking still. Pt made an appt with Dunn, PA today 3:10pm.

## 2022-02-26 NOTE — Patient Instructions (Signed)
Medication Instructions:  No changes at this time.   *If you need a refill on your cardiac medications before your next appointment, please call your pharmacy*   Lab Work: None  If you have labs (blood work) drawn today and your tests are completely normal, you will receive your results only by: [place] (if you have MyChart) OR A paper copy in the mail If you have any lab test that is abnormal or we need to change your treatment, we will call you to review the results.   Testing/Procedures: Your physician has requested that you have an echocardiogram. Echocardiography is a painless test that uses sound waves to create images of your heart. It provides your doctor with information about the size and shape of your heart and how well your heart's chambers and valves are working. This procedure takes approximately one hour. There are no restrictions for this procedure.    Follow-Up: At [facility], you and your health needs are our priority.  As part of our continuing mission to provide you with exceptional heart care, we have created designated Provider Care Teams.  These Care Teams include your primary Cardiologist (physician) and Advanced Practice Providers (APPs -  Physician Assistants and Nurse Practitioners) who all work together to provide you with the care you need, when you need it.  Your next appointment:   2 month(s)  The format for your next appointment:   In Person  Provider:   Kathlyn Sacramento, MD      Important Information About Sugar

## 2022-02-26 NOTE — Telephone Encounter (Signed)
Noted  

## 2022-03-02 ENCOUNTER — Other Ambulatory Visit: Payer: Self-pay | Admitting: Cardiovascular Disease

## 2022-03-02 DIAGNOSIS — I4891 Unspecified atrial fibrillation: Secondary | ICD-10-CM

## 2022-03-04 ENCOUNTER — Telehealth: Payer: Self-pay

## 2022-03-04 DIAGNOSIS — N183 Chronic kidney disease, stage 3 unspecified: Secondary | ICD-10-CM

## 2022-03-04 NOTE — Progress Notes (Signed)
Chronic Care Management Pharmacy Assistant   Name: Amber Reyes  MRN: [id number] DOB: 1945-10-31  Reason for Encounter: Follow-up since discontinuation of Farxiga  Patient contacted me a week ago regarding a reaction that she was having since starting the Farxiga. Patient developed a rash, shortness of breath, and swelling. Patient was instructed by PCP to stop the [place] and  by CPP to increase her Humalog back to 10 units twice daily.  I contacted the patient today to follow-up to see how her blood sugars have been since stopping the [place] and increasing her Humalog.  Per patient she is doing much better. Her shortness of breath has improved, her kidney function has improved, her swelling has improved and the rash has gone since being discontinued from the [place]. Patient also reports that she has started back taking Furosemide 40 mg daily.  Patient's blood sugars have been improving:  Fasting: 07/13  118  07/12  161 07/11  154 07/10  132 07/09  139  Patient would like to know that when her blood sugars are elevated in the mornings can she increase her Humalog by 1 or 2 units. I advised patient that I would send a message to the CPP today as he is in training so if I don't contact her back today I will contact her back tomorrow.   Patient also advised that her blood pressure is doing better. She does report having white coat syndrome and states that her blood pressure is always elevated more when she has provider appointments than when she takes it at home. Her reading this morning was 138/62 and heart rate 67. Patient has a follow-up appointment with PCP on 07/25 and she would like to bring her home blood pressure machine in to make sure it is calibrated correctly. Patient has no other concerns or issues at this time   Medications: Outpatient Encounter Medications as of 03/04/2022  Medication Sig   acetaminophen (TYLENOL) 650 MG CR tablet Take 650 mg by mouth in the  morning and at bedtime.   albuterol (PROVENTIL HFA;VENTOLIN HFA) 108 (90 BASE) MCG/ACT inhaler Inhale 2 puffs into the lungs every 6 (six) hours as needed for wheezing or shortness of breath.   Alcohol Swabs (B-D SINGLE USE SWABS REGULAR) PADS 1 Box by Does not apply route every 30 (thirty) days.   allopurinol (ZYLOPRIM) 100 MG tablet Take 1 tablet (100 mg total) by mouth 2 (two) times daily.   Blood Glucose Calibration (TRUE METRIX LEVEL 1) Low SOLN 1 each by In Vitro route once a week.   Blood Glucose Monitoring Suppl (TRUE METRIX AIR GLUCOSE METER) w/Device KIT Inject 1 each into the skin 4 (four) times daily as needed. Check fsbs 4 times daily E11.22   Cholecalciferol (VITAMIN D) 2000 UNITS tablet Take 2,000 Units by mouth daily.   colchicine (COLCRYS) 0.6 MG tablet Take 1-2 tablets (0.6-1.2 mg total) by mouth daily as needed.   diltiazem (CARDIZEM CD) 240 MG 24 hr capsule TAKE 1 CAPSULE EVERY DAY   diphenhydrAMINE (BENADRYL ALLERGY) 25 MG tablet Take 1 tablet (25 mg total) by mouth every 6 (six) hours as needed.   diphenhydrAMINE-zinc acetate (BENADRYL ITCH STOPPING) cream Apply topically 3 (three) times daily as needed for itching.   DROPLET PEN NEEDLES 30G X 8 MM MISC USE TWICE DAILY WITH INSULIN   Dulaglutide (TRULICITY) 1.5 YK/9.9IP SOPN Inject 1.5 mg into the skin once a week.   Elastic Bandages & Supports (MEDICAL COMPRESSION STOCKINGS) MISC  2 each by Does not apply route daily.   ELIQUIS 5 MG TABS tablet TAKE 1 TABLET TWICE DAILY   fluticasone (FLONASE) 50 MCG/ACT nasal spray Place 2 sprays into both nostrils daily.   fluticasone-salmeterol (ADVAIR) 250-50 MCG/ACT AEPB INHALE 1 PUFF BY MOUTH TWICE DAILY   furosemide (LASIX) 40 MG tablet TAKE 1 TABLET (40 MG TOTAL) BY MOUTH 2 (TWO) TIMES DAILY AS NEEDED.   Insulin Lispro Prot & Lispro (HUMALOG MIX 75/25 KWIKPEN) (75-25) 100 UNIT/ML Kwikpen Inject 6 Units into the skin 2 (two) times daily with a meal. (Patient taking differently: Inject  10 Units into the skin 2 (two) times daily with a meal.)   irbesartan (AVAPRO) 300 MG tablet Take 1 tablet (300 mg total) by mouth daily.   loratadine (CLARITIN) 10 MG tablet Take 1 tablet (10 mg total) by mouth daily.   potassium chloride SA (KLOR-CON) 20 MEQ tablet Take 1 tablet (20 mEq total) by mouth daily. With furosemide   pravastatin (PRAVACHOL) 80 MG tablet Take 1 tablet (80 mg total) by mouth every evening.   TRUE METRIX BLOOD GLUCOSE TEST test strip CHECK BLOOD SUGAR FOUR TIMES DAILY   TRUEplus Lancets 33G MISC TEST BLOOD SUGAR THREE TIMES DAILY AS NEEDED   TYLENOL 325 MG tablet    No facility-administered encounter medications on file as of 03/04/2022.    [name], CPA/CMA Clinical Pharmacist Assistant Phone: [phone]

## 2022-03-05 ENCOUNTER — Telehealth: Payer: Self-pay

## 2022-03-05 MED ORDER — INSULIN LISPRO PROT & LISPRO (75-25 MIX) 100 UNIT/ML KWIKPEN: 10.0000 [IU] | PEN_INJECTOR | Freq: Two times a day (BID) | SUBCUTANEOUS | Status: DC

## 2022-03-05 NOTE — Addendum Note (Signed)
Addended by: Daron Offer A on: 03/05/2022 02:31 PM   Modules accepted: Orders

## 2022-03-05 NOTE — Progress Notes (Signed)
Chronic Care Management Pharmacy Assistant   Name: Amber Reyes  MRN: [id number] DOB: 01-Jul-1946  Reason for Encounter: Medication Coordination for Increase in Insulin.  I spoke with the patient on yesterday and she wanted to know that if her blood sugar numbers are elevated in the mornings could she increase her Humalog.  Message was sent to [name], CPP and per his recommendation patient should:   "Recommend continuing with Humalog 10 units twice daily before breakfast and supper. However if her morning blood sugar is greater than 150 then she should give 2 extra units (12 units total) before breakfast."\  I spoke to the patient and advised her that if her fasting blood sugar in the morning is above 150 that per CPP she can give 12 units of Humalog before breakfast. Patient verbalized understanding. Per patient her fasting blood sugar this morning was 130. Patient encouraged to give me a call if something changes or if her blood sugars are not improving.   Medications: Outpatient Encounter Medications as of 03/05/2022  Medication Sig   acetaminophen (TYLENOL) 650 MG CR tablet Take 650 mg by mouth in the morning and at bedtime.   albuterol (PROVENTIL HFA;VENTOLIN HFA) 108 (90 BASE) MCG/ACT inhaler Inhale 2 puffs into the lungs every 6 (six) hours as needed for wheezing or shortness of breath.   Alcohol Swabs (B-D SINGLE USE SWABS REGULAR) PADS 1 Box by Does not apply route every 30 (thirty) days.   allopurinol (ZYLOPRIM) 100 MG tablet Take 1 tablet (100 mg total) by mouth 2 (two) times daily.   Blood Glucose Calibration (TRUE METRIX LEVEL 1) Low SOLN 1 each by In Vitro route once a week.   Blood Glucose Monitoring Suppl (TRUE METRIX AIR GLUCOSE METER) w/Device KIT Inject 1 each into the skin 4 (four) times daily as needed. Check fsbs 4 times daily E11.22   Cholecalciferol (VITAMIN D) 2000 UNITS tablet Take 2,000 Units by mouth daily.   colchicine (COLCRYS) 0.6 MG tablet Take  1-2 tablets (0.6-1.2 mg total) by mouth daily as needed.   diltiazem (CARDIZEM CD) 240 MG 24 hr capsule TAKE 1 CAPSULE EVERY DAY   diphenhydrAMINE (BENADRYL ALLERGY) 25 MG tablet Take 1 tablet (25 mg total) by mouth every 6 (six) hours as needed.   diphenhydrAMINE-zinc acetate (BENADRYL ITCH STOPPING) cream Apply topically 3 (three) times daily as needed for itching.   DROPLET PEN NEEDLES 30G X 8 MM MISC USE TWICE DAILY WITH INSULIN   Dulaglutide (TRULICITY) 1.5 DT/2.6ZT SOPN Inject 1.5 mg into the skin once a week.   Elastic Bandages & Supports (MEDICAL COMPRESSION STOCKINGS) MISC 2 each by Does not apply route daily.   ELIQUIS 5 MG TABS tablet TAKE 1 TABLET TWICE DAILY   fluticasone (FLONASE) 50 MCG/ACT nasal spray Place 2 sprays into both nostrils daily.   fluticasone-salmeterol (ADVAIR) 250-50 MCG/ACT AEPB INHALE 1 PUFF BY MOUTH TWICE DAILY   furosemide (LASIX) 40 MG tablet TAKE 1 TABLET (40 MG TOTAL) BY MOUTH 2 (TWO) TIMES DAILY AS NEEDED.   Insulin Lispro Prot & Lispro (HUMALOG MIX 75/25 KWIKPEN) (75-25) 100 UNIT/ML Kwikpen Inject 10 Units into the skin 2 (two) times daily with a meal.   irbesartan (AVAPRO) 300 MG tablet Take 1 tablet (300 mg total) by mouth daily.   loratadine (CLARITIN) 10 MG tablet Take 1 tablet (10 mg total) by mouth daily.   potassium chloride SA (KLOR-CON) 20 MEQ tablet Take 1 tablet (20 mEq total) by mouth daily. With  furosemide   pravastatin (PRAVACHOL) 80 MG tablet Take 1 tablet (80 mg total) by mouth every evening.   TRUE METRIX BLOOD GLUCOSE TEST test strip CHECK BLOOD SUGAR FOUR TIMES DAILY   TRUEplus Lancets 33G MISC TEST BLOOD SUGAR THREE TIMES DAILY AS NEEDED   TYLENOL 325 MG tablet    No facility-administered encounter medications on file as of 03/05/2022.    [name], CPA/CMA Clinical Pharmacist Assistant Phone: [phone]

## 2022-03-15 NOTE — Progress Notes (Unsigned)
Name: Amber Reyes   MRN: [id number]    DOB: 02/05/1946   Date:03/16/2022       Progress Note  Subjective  Chief Complaint  Follow Up  HPI  DMII controlled: She has been compliant with medication, she is on  Humalog 75/25 since  Feb 2018, also started on Trulicity in 3748 OLMB8M was 6.5%  6.7% ,6.9%, 6.8 % , 7 %, 7%  7.6 % and added Farxiga however she developed itching right away and SOB, got progressively worse and associated with hives and dizziness, she came in and medication was stopped also sent to follow up with Dr. Fletcher Anon. Taking Avapro for microalbuminuria and CKI stage III ,  she is on statin for dyslipidemia, she has diabetes associated with obesity. FSB at home was around 130-150's recently insulin adjusted by [name] 10 units BID but can take 12 units in am if fasting above 150 .   No polyphagia, polydipsia or polyuria.   HTN/CHF/Afib/Pulmonary hypertension/dilated aorta  : taking medications and denies side effects of medications, bp today is at goal in our office, at home bp has been 130's/50-60's She denies chest pain no recent palpitation, found to have Afib on EKG done by Dr. Fletcher Anon and takes Cardizem for rate control   She has orthopnea - uses two pillows , SOB is stable,back in May when she tried [place] developed increase in SOB - Dr. Rosana Berger told her to follow up with cardiologist and she will have an Echo early August .  Echo done 06/2018 LV function is normal and unable to assess diastolic dysfunction, severe left  atrium dilation , mild elevation of Pulmonary pressure, also has aorta dilation  She takes Eliquis and has been compliant with medication.   Hyperlipidemia: taking Pravastatin, we will continue current medication , reviewed labs done 11/22 and LDL was at goal at 67.   RLS: symptoms are still present, described as soreness on both legs at night, improves when she rubs her legs with icy hot. Unchanged    CKIII: she is going to see nephrologist at [facility], last  labs reviewed still stage III CKI , GFR stable, GFR has been stable between high 30 to 50's  denies  pruritis, she has good urine output, she is not taking potassium because she had hyperkalemia - but avoiding lasix, advised to take one potassium daily when she takes lasix  , she has secondary hyperparathyroidism - last level 107  back in 11/22 - recheck yearly    OSA: she was wearing CPAP machine every night, wakes up feeling rested, but has dry mouth, she has humidifier. She needs a new hose   Morbid Obesity: she gets Trulicity through [employer] she initially lost a lot of weight but now is stable,   she is trying to avoid carbohydrates, eating more vegetables , she has not been really physically active    Asthma Moderate persistent/Chronic bronchitis: . She is compliant and uses Advair twice daily as prescribed.  No wheezing , morning cough but is dry, her SOB is back to baseline  Atherosclerosis of aorta and echo showed mild dilation of ascending aorta, continue statin and eliquis.No side effects   Chronic back pain: she cannot stand or walk for a long time because increases the pain, dull pain, no radiculitis. She states Tylenol controls symptoms . Reminded her not to take NSAID's due to low kidney function   Patient Active Problem List   Diagnosis Date Noted   Localized, primary osteoarthritis of  shoulder region 04/22/2021   Pain in joint of left shoulder 04/22/2021   Tendonitis of shoulder, left 04/22/2021   Hypertension associated with stage 3 chronic kidney disease due to type 2 diabetes mellitus ([place]) 02/06/2020   History of colonic polyps    Polyp of colon    Thoracic aortic atherosclerosis ([place]) 12/30/2016   Controlled type 2 diabetes mellitus with stage 3 chronic kidney disease ([place]) 12/23/2016   Venous insufficiency 05/21/2016   Hypertensive heart disease 05/21/2016   Anemia in chronic kidney disease 12/22/2015   Pulmonary hypertension ([place]) 12/18/2015   Xanthelasma  04/15/2015   Anxiety 04/12/2015   Chronic kidney disease (CKD), stage III (moderate) (HCC) 04/12/2015   Edema extremities 04/12/2015   Disc disorder of lumbar region 04/12/2015   Asthma, moderate persistent 04/12/2015   Morbid obesity, unspecified obesity type ([place]) 04/12/2015   Obstructive apnea 04/12/2015   Restless leg 04/12/2015   Allergic rhinitis 04/12/2015   Vitamin D deficiency 04/12/2015   Controlled gout 04/12/2015   Premature atrial contractions 11/29/2013   Atrial fibrillation ([place]) 11/09/2013   Benign essential hypertension    Hyperlipidemia     Past Surgical History:  Procedure Laterality Date   CARDIAC CATHETERIZATION  2002   [facility]   CATARACT EXTRACTION W/PHACO Right 09/15/2021   Procedure: CATARACT EXTRACTION PHACO AND INTRAOCULAR LENS PLACEMENT (IOC) RIGHT 10.19 01:01.8;  Surgeon: Birder Robson, MD;  Location: [facility];  Service: Ophthalmology;  Laterality: Right;   CATARACT EXTRACTION W/PHACO Left 09/29/2021   Procedure: CATARACT EXTRACTION PHACO AND INTRAOCULAR LENS PLACEMENT (IOC) LEFT DIABETIC 11.40 01:21.2;  Surgeon: Birder Robson, MD;  Location: [place];  Service: Ophthalmology;  Laterality: Left;   COLONOSCOPY WITH PROPOFOL N/A 09/25/2018   Procedure: COLONOSCOPY WITH PROPOFOL;  Surgeon: Jonathon Bellows, MD;  Location[facility] END[facility]COPY;  Service: Gastroenterology;  Laterality: N/A;   COLONOSCOPY WITH PROPOFOL N/A 04/13/2019   Procedure: COLONOSCOPY WITH PROPOFOL;  Surgeon: Jonathon Bellows, MD;  Location: [facility] ENDOSCOPY;  Service: Gastroenterology;  Laterality: N/A;   COLONOSCOPY WITH PROPOFOL N/A 06/27/2020   Procedure: COLONOSCOPY WITH PROPOFOL;  Surgeon: Jonathon Bellows, MD;  Location: [facility] ENDOSCOPY;  Service: Gastroenterology;  Laterality: N/A;   PERICARDIUM SURGERY      Family History  Problem Relation Age of Onset   Heart attack Mother    Hypertension Mother    Breast cancer Neg Hx     Social History   Tobacco Use   Smoking status:  Never   Smokeless tobacco: Never   Tobacco comments:    n/a  Substance Use Topics   Alcohol use: No    Alcohol/week: 0.0 standard drinks of alcohol     Current Outpatient Medications:    acetaminophen (TYLENOL) 650 MG CR tablet, Take 650 mg by mouth in the morning and at bedtime., Disp: , Rfl:    albuterol (PROVENTIL HFA;VENTOLIN HFA) 108 (90 BASE) MCG/ACT inhaler, Inhale 2 puffs into the lungs every 6 (six) hours as needed for wheezing or shortness of breath., Disp: , Rfl:    Alcohol Swabs (B-D SINGLE USE SWABS REGULAR) PADS, 1 Box by Does not apply route every 30 (thirty) days., Disp: 100 each, Rfl: 3   allopurinol (ZYLOPRIM) 100 MG tablet, Take 1 tablet (100 mg total) by mouth 2 (two) times daily., Disp: 180 tablet, Rfl: 1   Blood Glucose Calibration (TRUE METRIX LEVEL 1) Low SOLN, 1 each by In Vitro route once a week., Disp: 1 each, Rfl: 1   Blood Glucose Monitoring Suppl (TRUE METRIX AIR  GLUCOSE METER) w/Device KIT, Inject 1 each into the skin 4 (four) times daily as needed. Check fsbs 4 times daily E11.22, Disp: 1 kit, Rfl: 0   Cholecalciferol (VITAMIN D) 2000 UNITS tablet, Take 2,000 Units by mouth daily., Disp: , Rfl:    colchicine (COLCRYS) 0.6 MG tablet, Take 1-2 tablets (0.6-1.2 mg total) by mouth daily as needed., Disp: 30 tablet, Rfl: 0   diltiazem (CARDIZEM CD) 240 MG 24 hr capsule, TAKE 1 CAPSULE EVERY DAY, Disp: 90 capsule, Rfl: 0   diphenhydrAMINE (BENADRYL ALLERGY) 25 MG tablet, Take 1 tablet (25 mg total) by mouth every 6 (six) hours as needed., Disp: 30 tablet, Rfl: 0   diphenhydrAMINE-zinc acetate (BENADRYL ITCH STOPPING) cream, Apply topically 3 (three) times daily as needed for itching., Disp: 28.3 g, Rfl: 0   DROPLET PEN NEEDLES 30G X 8 MM MISC, USE TWICE DAILY WITH INSULIN, Disp: 200 each, Rfl: 3   Dulaglutide (TRULICITY) 3 QX/4.5WT SOPN, Inject 3 mg as directed once a week., Disp: 6 mL, Rfl: 3   Elastic Bandages & Supports (MEDICAL COMPRESSION STOCKINGS) MISC, 2 each  by Does not apply route daily., Disp: 2 each, Rfl: 2   ELIQUIS 5 MG TABS tablet, TAKE 1 TABLET TWICE DAILY, Disp: 180 tablet, Rfl: 11   fluticasone (FLONASE) 50 MCG/ACT nasal spray, Place 2 sprays into both nostrils daily., Disp: 48 g, Rfl: 2   fluticasone-salmeterol (ADVAIR) 250-50 MCG/ACT AEPB, INHALE 1 PUFF BY MOUTH TWICE DAILY, Disp: 180 each, Rfl: 1   furosemide (LASIX) 40 MG tablet, TAKE 1 TABLET (40 MG TOTAL) BY MOUTH 2 (TWO) TIMES DAILY AS NEEDED., Disp: 180 tablet, Rfl: 1   Insulin Lispro Prot & Lispro (HUMALOG MIX 75/25 KWIKPEN) (75-25) 100 UNIT/ML Kwikpen, Inject 10 Units into the skin 2 (two) times daily with a meal., Disp: , Rfl:    irbesartan (AVAPRO) 300 MG tablet, Take 1 tablet (300 mg total) by mouth daily., Disp: 90 tablet, Rfl: 1   loratadine (CLARITIN) 10 MG tablet, Take 1 tablet (10 mg total) by mouth daily., Disp: 30 tablet, Rfl: 11   potassium chloride SA (KLOR-CON) 20 MEQ tablet, Take 1 tablet (20 mEq total) by mouth daily. With furosemide, Disp: 90 tablet, Rfl: 1   pravastatin (PRAVACHOL) 80 MG tablet, Take 1 tablet (80 mg total) by mouth every evening., Disp: 90 tablet, Rfl: 1   TRUE METRIX BLOOD GLUCOSE TEST test strip, CHECK BLOOD SUGAR FOUR TIMES DAILY, Disp: 400 strip, Rfl: 0   TRUEplus Lancets 33G MISC, TEST BLOOD SUGAR THREE TIMES DAILY AS NEEDED, Disp: 100 each, Rfl: 3   TYLENOL 325 MG tablet, , Disp: , Rfl:   Allergies  Allergen Reactions   Farxiga [Dapagliflozin] Hives    I personally reviewed active problem list, medication list, allergies, family history, social history, health maintenance with the patient/caregiver today.   ROS  Constitutional: Negative for fever , positive for mild weight change.  Respiratory: Negative for cough and shortness of breath.   Cardiovascular: Negative for chest pain or palpitations.  Gastrointestinal: Negative for abdominal pain, no bowel changes.  Musculoskeletal: Negative for gait problem or joint swelling.  Skin:  Negative for rash.  Neurological: Negative for dizziness or headache.  No other specific complaints in a complete review of systems (except as listed in HPI above).   Objective  Vitals:   03/16/22 0805  BP: 132/74  Pulse: 92  Resp: 16  SpO2: 99%  Weight: (!) 347 lb (157.4 kg)  Height: '5\' 10"'  (1.778  m)    Body mass index is 49.79 kg/m.  Physical Exam  Constitutional: Patient appears well-developed and well-nourished. Obese  No distress.  HEENT: head atraumatic, normocephalic, pupils equal and reactive to light, neck supple Cardiovascular: Normal rate, regular rhythm and normal heart sounds.  No murmur heard. Trace  BLE edema. Pulmonary/Chest: Effort normal and breath sounds normal. No respiratory distress. Abdominal: Soft.  There is no tenderness. Psychiatric: Patient has a normal mood and affect. behavior is normal. Judgment and thought content normal.   Recent Results (from the past 2160 hour(s))  Basic Metabolic Panel (BMET)     Status: Abnormal   Collection Time: 02/01/22  8:05 AM  Result Value Ref Range   Glucose, Bld 180 (H) 65 - 99 mg/dL    Comment: .            Fasting reference interval . For someone without known diabetes, a glucose value >125 mg/dL indicates that they may have diabetes and this should be confirmed with a follow-up test. .    BUN 31 (H) 7 - 25 mg/dL   Creat 1.44 (H) 0.60 - 1.00 mg/dL   BUN/Creatinine Ratio 22 6 - 22 (calc)   Sodium 140 135 - 146 mmol/L   Potassium 4.3 3.5 - 5.3 mmol/L   Chloride 107 98 - 110 mmol/L   CO2 24 20 - 32 mmol/L   Calcium 8.8 8.6 - 10.4 mg/dL  Basic Metabolic Panel (BMET)     Status: Abnormal   Collection Time: 02/25/22  2:36 PM  Result Value Ref Range   Glucose, Bld 159 (H) 65 - 99 mg/dL    Comment: .            Fasting reference interval . For someone without known diabetes, a glucose value >125 mg/dL indicates that they may have diabetes and this should be confirmed with a follow-up test. .    BUN  31 (H) 7 - 25 mg/dL   Creat 1.27 (H) 0.60 - 1.00 mg/dL   BUN/Creatinine Ratio 24 (H) 6 - 22 (calc)   Sodium 140 135 - 146 mmol/L   Potassium 4.3 3.5 - 5.3 mmol/L   Chloride 106 98 - 110 mmol/L   CO2 26 20 - 32 mmol/L   Calcium 8.9 8.6 - 10.4 mg/dL  B Nat Peptide     Status: None   Collection Time: 02/25/22  2:36 PM  Result Value Ref Range   Brain Natriuretic Peptide 61 <100 pg/mL    Comment: . BNP levels increase with age in the general population with the highest values seen in individuals greater than 11 years of age. Reference: J. Am. [place] Ar. Cardiol. 2002; 10:258-527. Marland Kitchen   POCT HgB A1C     Status: Abnormal   Collection Time: 03/16/22  8:06 AM  Result Value Ref Range   Hemoglobin A1C 7.4 (A) 4.0 - 5.6 %   HbA1c POC (<> result, manual entry)     HbA1c, POC (prediabetic range)     HbA1c, POC (controlled diabetic range)      Diabetic Foot Exam: Diabetic Foot Exam - Simple   Simple Foot Form Visual Inspection No deformities, no ulcerations, no other skin breakdown bilaterally: Yes Sensation Testing Intact to touch and monofilament testing bilaterally: Yes Pulse Check Posterior Tibialis and Dorsalis pulse intact bilaterally: Yes Comments      PHQ2/9:    03/16/2022    8:05 AM 02/25/2022    1:49 PM 02/19/2022    3:51 PM 11/11/2021  8:16 AM 07/14/2021    8:04 AM  Depression screen PHQ 2/9  Decreased Interest 0 0 0 0 0  Down, Depressed, Hopeless 0 0 0 0 0  PHQ - 2 Score 0 0 0 0 0  Altered sleeping 0 0 0 0 0  Tired, decreased energy 0 0 0 0 0  Change in appetite 0 0 0 0 0  Feeling bad or failure about yourself  0 0 0 0 0  Trouble concentrating 0 0 0 0 0  Moving slowly or fidgety/restless 0 0 0 0 0  Suicidal thoughts 0 0 0 0 0  PHQ-9 Score 0 0 0 0 0  Difficult doing work/chores  Not difficult at all Not difficult at all      phq 9 is negative   Fall Risk:    03/16/2022    8:05 AM 02/25/2022    1:49 PM 02/19/2022    3:47 PM 11/11/2021    8:16 AM 07/14/2021     8:03 AM  Fall Risk   Falls in the past year? 0 0 0 0 0  Number falls in past yr: 0 0 0 0 0  Injury with Fall? 0 0 0 0 0  Risk for fall due to : No Fall Risks   No Fall Risks No Fall Risks  Follow up Falls prevention discussed   Falls prevention discussed Falls prevention discussed      Functional Status Survey: Is the patient deaf or have difficulty hearing?: No Does the patient have difficulty seeing, even when wearing glasses/contacts?: No Does the patient have difficulty concentrating, remembering, or making decisions?: No Does the patient have difficulty walking or climbing stairs?: Yes Does the patient have difficulty dressing or bathing?: No Does the patient have difficulty doing errands alone such as visiting a doctor's office or shopping?: No    Assessment & Plan  1. Controlled type 2 diabetes mellitus with stage 3 chronic kidney disease, with long-term current use of insulin (HCC)  - POCT HgB A1C - HM Diabetes Foot Exam - Dulaglutide (TRULICITY) 3 MW/1.0UV SOPN; Inject 3 mg as directed once a week.  Dispense: 6 mL; Refill: 3  2. Controlled type 2 diabetes mellitus with microalbuminuria, with long-term current use of insulin (HCC)  - Dulaglutide (TRULICITY) 3 OZ/3.6UY SOPN; Inject 3 mg as directed once a week.  Dispense: 6 mL; Refill: 3  3. Dilation of aorta (HCC)  She will have repeat echo soon  4. Morbid obesity ([place])  Discussed with the patient the risk posed by an increased BMI. Discussed importance of portion control, calorie counting and at least 150 minutes of physical activity weekly. Avoid sweet beverages and drink more water. Eat at least 6 servings of fruit and vegetables daily    5. Anemia in stage 3b chronic kidney disease ([place])   6. Secondary hyperparathyroidism of renal origin ([place])   7. Pulmonary hypertension ([place])   8. Dyslipidemia associated with type 2 diabetes mellitus (HCC)  - pravastatin (PRAVACHOL) 80 MG tablet; Take 1 tablet (80 mg  total) by mouth every evening.  Dispense: 90 tablet; Refill: 1  9. Chronic atrial fibrillation (HCC)  Rate controlled  10. Thoracic aortic atherosclerosis ([place])   11. Obstructive apnea   12. Hypertensive heart disease without heart failure   13. Moderate persistent asthma without complication  - fluticasone-salmeterol (ADVAIR) 250-50 MCG/ACT AEPB; INHALE 1 PUFF BY MOUTH TWICE DAILY  Dispense: 180 each; Refill: 1  14. Controlled gout  - allopurinol (ZYLOPRIM) 100 MG  tablet; Take 1 tablet (100 mg total) by mouth 2 (two) times daily.  Dispense: 180 tablet; Refill: 1  15. Benign essential hypertension  - irbesartan (AVAPRO) 300 MG tablet; Take 1 tablet (300 mg total) by mouth daily.  Dispense: 90 tablet; Refill: 1

## 2022-03-16 ENCOUNTER — Ambulatory Visit (INDEPENDENT_AMBULATORY_CARE_PROVIDER_SITE_OTHER): Payer: Medicare PPO | Admitting: Family Medicine

## 2022-03-16 ENCOUNTER — Encounter: Payer: Self-pay | Admitting: Family Medicine

## 2022-03-16 VITALS — BP 132/74 | HR 92 | Resp 16 | Ht 70.0 in | Wt 347.0 lb

## 2022-03-16 DIAGNOSIS — N183 Chronic kidney disease, stage 3 unspecified: Secondary | ICD-10-CM | POA: Diagnosis not present

## 2022-03-16 DIAGNOSIS — E1122 Type 2 diabetes mellitus with diabetic chronic kidney disease: Secondary | ICD-10-CM | POA: Diagnosis not present

## 2022-03-16 DIAGNOSIS — E1169 Type 2 diabetes mellitus with other specified complication: Secondary | ICD-10-CM | POA: Diagnosis not present

## 2022-03-16 DIAGNOSIS — G4733 Obstructive sleep apnea (adult) (pediatric): Secondary | ICD-10-CM

## 2022-03-16 DIAGNOSIS — N1832 Chronic kidney disease, stage 3b: Secondary | ICD-10-CM | POA: Diagnosis not present

## 2022-03-16 DIAGNOSIS — E785 Hyperlipidemia, unspecified: Secondary | ICD-10-CM

## 2022-03-16 DIAGNOSIS — I77819 Aortic ectasia, unspecified site: Secondary | ICD-10-CM

## 2022-03-16 DIAGNOSIS — E1129 Type 2 diabetes mellitus with other diabetic kidney complication: Secondary | ICD-10-CM

## 2022-03-16 DIAGNOSIS — N2581 Secondary hyperparathyroidism of renal origin: Secondary | ICD-10-CM

## 2022-03-16 DIAGNOSIS — Z794 Long term (current) use of insulin: Secondary | ICD-10-CM | POA: Diagnosis not present

## 2022-03-16 DIAGNOSIS — I119 Hypertensive heart disease without heart failure: Secondary | ICD-10-CM

## 2022-03-16 DIAGNOSIS — I10 Essential (primary) hypertension: Secondary | ICD-10-CM

## 2022-03-16 DIAGNOSIS — I482 Chronic atrial fibrillation, unspecified: Secondary | ICD-10-CM | POA: Diagnosis not present

## 2022-03-16 DIAGNOSIS — R809 Proteinuria, unspecified: Secondary | ICD-10-CM

## 2022-03-16 DIAGNOSIS — J4540 Moderate persistent asthma, uncomplicated: Secondary | ICD-10-CM

## 2022-03-16 DIAGNOSIS — D631 Anemia in chronic kidney disease: Secondary | ICD-10-CM

## 2022-03-16 DIAGNOSIS — M109 Gout, unspecified: Secondary | ICD-10-CM

## 2022-03-16 DIAGNOSIS — I272 Pulmonary hypertension, unspecified: Secondary | ICD-10-CM

## 2022-03-16 DIAGNOSIS — I700 Atherosclerosis of aorta: Secondary | ICD-10-CM

## 2022-03-16 LAB — POCT GLYCOSYLATED HEMOGLOBIN (HGB A1C): Hemoglobin A1C: 7.4 % — AB (ref 4.0–5.6)

## 2022-03-16 MED ORDER — FLUTICASONE-SALMETEROL 250-50 MCG/ACT IN AEPB: INHALATION_SPRAY | RESPIRATORY_TRACT | 1 refills | Status: DC

## 2022-03-16 MED ORDER — IRBESARTAN 300 MG PO TABS: 300.0000 mg | ORAL_TABLET | Freq: Every day | ORAL | 1 refills | Status: DC

## 2022-03-16 MED ORDER — ALLOPURINOL 100 MG PO TABS: 100.0000 mg | ORAL_TABLET | Freq: Two times a day (BID) | ORAL | 1 refills | Status: DC

## 2022-03-16 MED ORDER — TRULICITY 3 MG/0.5ML ~~LOC~~ SOAJ: 3.0000 mg | SUBCUTANEOUS | 3 refills | Status: DC

## 2022-03-16 MED ORDER — PRAVASTATIN SODIUM 80 MG PO TABS: 80.0000 mg | ORAL_TABLET | Freq: Every evening | ORAL | 1 refills | Status: DC

## 2022-03-30 ENCOUNTER — Ambulatory Visit (INDEPENDENT_AMBULATORY_CARE_PROVIDER_SITE_OTHER): Payer: Medicare PPO

## 2022-03-30 ENCOUNTER — Telehealth: Payer: Self-pay | Admitting: Cardiovascular Disease

## 2022-03-30 ENCOUNTER — Telehealth: Payer: Self-pay

## 2022-03-30 DIAGNOSIS — R0602 Shortness of breath: Secondary | ICD-10-CM | POA: Diagnosis not present

## 2022-03-30 LAB — ECHOCARDIOGRAM COMPLETE
AR max vel: 2.43 cm2
AV Area VTI: 2.69 cm2
AV Area mean vel: 2.69 cm2
AV Mean grad: 5 mmHg
AV Peak grad: 8.5 mmHg
Ao pk vel: 1.46 m/s
Area-P 1/2: 3.6 cm2
S' Lateral: 3.2 cm

## 2022-03-30 NOTE — Telephone Encounter (Signed)
Patient dropped a PAF to be filled out, also requesting a copy of medication list & insurance card to be attached once completed. Forms placed in box.

## 2022-03-30 NOTE — Progress Notes (Signed)
    Chronic Care Management Pharmacy Assistant   Name: Amber Reyes  MRN: [id number] DOB: 1946/03/04  Patient called to be reminded of her telephone appointment with [name], CPP on 03/31/2022 @ 0915.  Patient aware of appointment date, time, and type of appointment (either telephone or in person). Patient aware to have/bring all medications, supplements, blood pressure and/or blood sugar logs to visit.  Star Rating Drug: Irbesartan 300 mg last filled on 02/09/2022 for a 90-Day supply with [name] 80 mg last filled on 01/10/2022 for a 90-Day supply with [name] 1.5 mg patient receives this medication through [name]  Any gaps in medications fill history? No  Care Gaps: Z[place] Vaccines [name] Influenza Vaccine  [name], CPA/CMA Clinical Pharmacist Assistant Phone: [phone]

## 2022-03-31 ENCOUNTER — Other Ambulatory Visit: Payer: Self-pay | Admitting: Cardiovascular Disease

## 2022-03-31 ENCOUNTER — Ambulatory Visit (INDEPENDENT_AMBULATORY_CARE_PROVIDER_SITE_OTHER): Payer: Medicare PPO

## 2022-03-31 DIAGNOSIS — I10 Essential (primary) hypertension: Secondary | ICD-10-CM

## 2022-03-31 DIAGNOSIS — E1122 Type 2 diabetes mellitus with diabetic chronic kidney disease: Secondary | ICD-10-CM

## 2022-03-31 DIAGNOSIS — I4891 Unspecified atrial fibrillation: Secondary | ICD-10-CM

## 2022-03-31 DIAGNOSIS — I110 Hypertensive heart disease with heart failure: Secondary | ICD-10-CM

## 2022-03-31 NOTE — Progress Notes (Signed)
Chronic Care Management Pharmacy Note  03/31/2022 Name:  Amber Reyes MRN:  [id number] DOB:  [birth year]  Summary: Patient presents for CCM follow-up. Home blood pressure and blood sugar remain well controlled  Patient currently finishing up supply of Trulicity 1.5 mg pens prior to starting Trulicity 3 mg weekly.  Recommendations/Changes made from today's visit: Continue current medications  Plan:  CPP follow-up in 3 months  Subjective: Amber Reyes is an 76 y.o. year old female who is a primary patient of Steele Sizer, MD.  The CCM team was consulted for assistance with disease management and care coordination needs.    Engaged with patient by telephone for follow up visit in response to provider referral for pharmacy case management and/or care coordination services.   Consent to Services:  The patient was given information about Chronic Care Management services, agreed to services, and gave verbal consent prior to initiation of services.  Please see initial visit note for detailed documentation.   Patient Care Team: Steele Sizer, MD as PCP - General (Family Medicine) Wellington Hampshire, MD as PCP - Cardiology (Cardiology) Wellington Hampshire, MD as Consulting Physician (Cardiology) Germaine Pomfret, [facility] as Pharmacist (Pharmacist)  Recent office visits: 03/16/22: Patient presented to Dr. Ancil Boozer for follow-up. A1c 7.4%. Trulicity 3 mg weekly  11/30/79: Patient presented to Dr. Rosana Berger. Wilder Glade stopped.   Recent consult visits: 02/26/22: Patient presented to [facility] (cardiology). BP 170/78.  10/28/21: Patient presented to [facility], ANP (Nephrology).   Hospital visits: None in previous 6 months  Objective:  Lab Results  Component Value Date   CREATININE 1.27 (H) 02/25/2022   BUN 31 (H) 02/25/2022   GFRNONAA 41 (L) 05/12/2020   GFRAA 48 (L) 05/12/2020   NA 140 02/25/2022   K 4.3 02/25/2022   CALCIUM 8.9 02/25/2022   CO2 26 02/25/2022    Lab  Results  Component Value Date/Time   HGBA1C 7.4 (A) 03/16/2022 08:06 AM   HGBA1C 7.6 (H) 11/11/2021 09:37 AM   HGBA1C 7.0 (A) 07/14/2021 08:18 AM   HGBA1C 6.8 (H) 05/14/2019 12:00 AM   HGBA1C 6.5 01/01/2019 07:48 AM   HGBA1C 6.6 08/30/2018 07:46 AM   HGBA1C 6.6 (A) 08/30/2018 07:46 AM   HGBA1C 6.6 08/30/2018 07:46 AM   MICROALBUR 31.9 07/14/2021 09:01 AM   MICROALBUR 5.4 05/14/2019 12:00 AM   MICROALBUR 20 08/15/2015 08:12 AM    Last diabetic Eye exam:  Lab Results  Component Value Date/Time   HMDIABEYEEXA No Retinopathy 08/19/2021 12:00 AM    Last diabetic Foot exam: No results found for: "HMDIABFOOTEX"   Lab Results  Component Value Date   CHOL 134 07/14/2021   HDL 51 07/14/2021   LDLCALC 67 07/14/2021   TRIG 78 07/14/2021   CHOLHDL 2.6 07/14/2021       Latest Ref Rng & Units 11/11/2021    9:37 AM 07/14/2021    9:01 AM 05/12/2020    8:59 AM  Hepatic Function  Total Protein 6.1 - 8.1 g/dL 6.7  6.6  6.8   AST 10 - 35 U/L '15  13  15   ' ALT 6 - 29 U/L '6  6  8   ' Total Bilirubin 0.2 - 1.2 mg/dL 0.4  0.5  0.5     No results found for: "TSH", "FREET4"     Latest Ref Rng & Units 07/14/2021    9:01 AM 11/19/2020    8:26 AM 05/12/2020    8:59 AM  CBC  WBC 3.8 -  10.8 Thousand/uL 8.9  7.6  8.6   Hemoglobin 11.7 - 15.5 g/dL 10.8  10.2  10.3   Hematocrit 35.0 - 45.0 % 33.4  31.9  31.7   Platelets 140 - 400 Thousand/uL 228  225  229     Lab Results  Component Value Date/Time   VD25OH 45 05/12/2020 08:59 AM   VD25OH 48 05/14/2019 12:00 AM    Clinical ASCVD: Yes  The 10-year ASCVD risk score (Arnett DK, et al., 2019) is: 23.3%   Values used to calculate the score:     Age: 60 years     Sex: Female     Is Non-Hispanic African American: Yes     Diabetic: Yes     Tobacco smoker: No     Systolic Blood Pressure: 540 mmHg     Is BP treated: Yes     HDL Cholesterol: 51 mg/dL     Total Cholesterol: 134 mg/dL       03/16/2022    8:05 AM 02/25/2022    1:49 PM 02/19/2022     3:51 PM  Depression screen PHQ 2/9  Decreased Interest 0 0 0  Down, Depressed, Hopeless 0 0 0  PHQ - 2 Score 0 0 0  Altered sleeping 0 0 0  Tired, decreased energy 0 0 0  Change in appetite 0 0 0  Feeling bad or failure about yourself  0 0 0  Trouble concentrating 0 0 0  Moving slowly or fidgety/restless 0 0 0  Suicidal thoughts 0 0 0  PHQ-9 Score 0 0 0  Difficult doing work/chores  Not difficult at all Not difficult at all    Social History   Tobacco Use  Smoking Status Never  Smokeless Tobacco Never  Tobacco Comments   n/a   BP Readings from Last 3 Encounters:  03/16/22 132/74  02/26/22 (!) 170/78  02/25/22 (!) 172/72   Pulse Readings from Last 3 Encounters:  03/16/22 92  02/26/22 77  02/25/22 100   Wt Readings from Last 3 Encounters:  03/16/22 (!) 347 lb (157.4 kg)  02/26/22 (!) 350 lb 6 oz (158.9 kg)  02/25/22 (!) 354 lb 8 oz (160.8 kg)    Assessment/Interventions: Review of patient past medical history, allergies, medications, health status, including review of consultants reports, laboratory and other test data, was performed as part of comprehensive evaluation and provision of chronic care management services.   SDOH:  (Social Determinants of Health) assessments and interventions performed: Yes      CCM Care Plan  Allergies  Allergen Reactions   Farxiga [Dapagliflozin] Hives    Medications Reviewed Today     Reviewed by Steele Sizer, MD (Physician) on 03/16/22 at [phone]  Med List Status: <None>   Medication Order Taking? Sig Documenting Provider Last Dose Status Informant  acetaminophen (TYLENOL) 650 MG CR tablet [id number] Yes Take 650 mg by mouth in the morning and at bedtime. [provider] Taking Active   albuterol (PROVENTIL HFA;VENTOLIN HFA) 108 (90 BASE) MCG/ACT inhaler [id number] Yes Inhale 2 puffs into the lungs every 6 (six) hours as needed for wheezing or shortness of breath. [provider] Taking Active Self  Alcohol  Swabs (B-D SINGLE USE SWABS REGULAR) PADS [id number] Yes 1 Box by Does not apply route every 30 (thirty) days. Steele Sizer, MD Taking Active   allopurinol (ZYLOPRIM) 100 MG tablet [id number] Yes Take 1 tablet (100 mg total) by mouth 2 (two) times daily. Steele Sizer, MD Taking Active   Blood  Glucose Calibration (TRUE METRIX LEVEL 1) Low SOLN [id number] Yes 1 each by In Vitro route once a week. Steele Sizer, MD Taking Active   Blood Glucose Monitoring Suppl (TRUE METRIX AIR GLUCOSE METER) w/Device KIT [id number] Yes Inject 1 each into the skin 4 (four) times daily as needed. Check fsbs 4 times daily E11.22 Steele Sizer, MD Taking Active   Cholecalciferol (VITAMIN D) 2000 UNITS tablet [id number] Yes Take 2,000 Units by mouth daily. [provider] Taking Active Self  colchicine (COLCRYS) 0.6 MG tablet [id number] Yes Take 1-2 tablets (0.6-1.2 mg total) by mouth daily as needed. Steele Sizer, MD Taking Active   diltiazem [facility] CD) 240 MG 24 hr capsule [id number] Yes TAKE 1 CAPSULE EVERY DAY Wellington Hampshire, MD Taking Active   Discontinued 03/16/22 [phone] (Completed Course)   Discontinued 03/16/22 0833 (Completed Course)   DROPLET PEN NEEDLES 30G X 8 MM MISC [id number] Yes USE TWICE DAILY WITH INSULIN Steele Sizer, MD Taking Active   Discontinued 03/16/22 0817 (Change in therapy)   Dulaglutide (TRULICITY) 3 PY/0.9XI SOPN [id number] Yes Inject 3 mg as directed once a week. Steele Sizer, MD  Active   Elastic Bandages & Supports ([place]) [place] [id number] Yes 2 each by Does not apply route daily. Steele Sizer, MD Taking Active Self  ELIQUIS 5 MG TABS tablet [id number] Yes TAKE 1 TABLET TWICE DAILY Minus Breeding, MD Taking Active   fluticasone [facility]) 50 MCG/ACT nasal spray [id number] Yes Place 2 sprays into both nostrils daily. Steele Sizer, MD Taking Active   fluticasone-salmeterol (ADVAIR) 250-50 MCG/ACT AEPB [id number] Yes INHALE 1 PUFF BY MOUTH TWICE  DAILY Ancil Boozer, Drue Stager, MD Taking Active   furosemide (LASIX) 40 MG tablet [id number] Yes TAKE 1 TABLET (40 MG TOTAL) BY MOUTH 2 (TWO) TIMES DAILY AS NEEDED. Steele Sizer, MD Taking Active   Insulin Lispro Prot & Lispro (HUMALOG MIX 75/25 KWIKPEN) (75-25) 100 UNIT/ML Claiborne Rigg [id number] Yes Inject 10 Units into the skin 2 (two) times daily with a meal. Ancil Boozer, Drue Stager, MD Taking Active   irbesartan (AVAPRO) 300 MG tablet [id number] Yes Take 1 tablet (300 mg total) by mouth daily. Steele Sizer, MD Taking Active   loratadine (CLARITIN) 10 MG tablet [id number] Yes Take 1 tablet (10 mg total) by mouth daily. Teodora Medici, DO Taking Active   potassium chloride SA (KLOR-CON) 20 MEQ tablet [id number] Yes Take 1 tablet (20 mEq total) by mouth daily. With furosemide Steele Sizer, MD Taking Active   pravastatin (PRAVACHOL) 80 MG tablet [id number] Yes Take 1 tablet (80 mg total) by mouth every evening. Steele Sizer, MD Taking Active   TRUE METRIX BLOOD GLUCOSE TEST test strip [id number] Yes CHECK BLOOD SUGAR FOUR TIMES DAILY Steele Sizer, MD Taking Active   TRUEplus Lancets 33G MISC [id number] Yes TEST BLOOD SUGAR THREE TIMES DAILY AS NEEDED Steele Sizer, MD Taking Active   TYLENOL 325 MG tablet [id number] Yes  [provider] Taking Active             Patient Active Problem List   Diagnosis Date Noted   Localized, 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/22/2021   Pain in joint of left shoulder 04/22/2021   Tendonitis of shoulder, left 04/22/2021   Hypertension associated with stage 3 chronic kidney disease due to type 2 diabetes mellitus ([place]) 02/06/2020   History of colonic polyps    Polyp of colon    Thoracic aortic atherosclerosis ([place]) 12/30/2016   Controlled type 2 diabetes mellitus with stage 3 chronic kidney disease ([place])  12/23/2016   Venous insufficiency 05/21/2016   Hypertensive heart disease 05/21/2016   Anemia in chronic kidney disease 12/22/2015   Pulmonary  hypertension ([place]) 12/18/2015   Xanthelasma 04/15/2015   Anxiety 04/12/2015   Chronic kidney disease (CKD), stage III (moderate) (HCC) 04/12/2015   Edema extremities 04/12/2015   Disc disorder of lumbar region 04/12/2015   Asthma, moderate persistent 04/12/2015   Morbid obesity, unspecified obesity type ([place]) 04/12/2015   Obstructive apnea 04/12/2015   Restless leg 04/12/2015   Allergic rhinitis 04/12/2015   Vitamin D deficiency 04/12/2015   Controlled gout 04/12/2015   Premature atrial contractions 11/29/2013   Atrial fibrillation ([place]) 11/09/2013   Benign essential hypertension    Hyperlipidemia     Immunization History  Administered Date(s) Administered   Fluad Quad(high Dose 65+) 05/14/2019, 05/12/2020, 07/14/2021   Influenza Split 05/16/2007   Influenza, High Dose Seasonal PF 06/24/2017, 04/25/2018   Influenza, Seasonal, Injecte, Preservative Fre 05/07/2010, 04/27/2011   Influenza,inj,Quad PF,6+ Mos 08/02/2014, 04/15/2015, 05/22/2016   Influenza-Unspecified 08/02/2014   Moderna Sars-Covid-2 Vaccination 09/28/2019, 10/26/2019, 07/02/2020   Pneumococcal Conjugate-13 08/02/2014   Pneumococcal Polysaccharide-23 04/27/2011, 06/04/2016   Tdap 09/29/2012   Zoster, Live 09/29/2012    Conditions to be addressed/monitored:  Hypertension, Hyperlipidemia, Diabetes, Atrial Fibrillation, Asthma, Chronic Kidney Disease and Gout  Care Plan : General Pharmacy (Adult)  Updates made by Germaine Pomfret, RPH since 03/31/2022 12:00 AM     Problem: Hypertension, Hyperlipidemia, Diabetes, Atrial Fibrillation, Asthma, Chronic Kidney Disease and Gout   Priority: High     Long-Range Goal: Patient-Specific Goal   Start Date: 10/29/2020  Expected End Date: 04/01/2023  This Visit's Progress: On track  Recent Progress: On track  Priority: High  Note:   Current Barriers:  Unable to independently afford treatment regimen  Pharmacist Clinical Goal(s):  Over the next 90 days, patient will  maintain control of diabetes as evidenced by A1c less than 8%  through collaboration with PharmD and provider.   Interventions: 1:1 collaboration with Steele Sizer, MD regarding development and update of comprehensive plan of care as evidenced by provider attestation and co-signature Inter-disciplinary care team collaboration (see longitudinal plan of care) Comprehensive medication review performed; medication list updated in electronic medical record  Hypertension (BP goal <130/80) -Controlled -Current treatment: Diltiazem CD 240 mg daily: Appropriate, Effective, Safe, Accessible  Furosemide 40 mg twice daily as needed: Appropriate, Effective, Query Safe Irbersartan 300 mg daily: Appropriate, Effective, Safe, Accessible  -Medications previously tried: NA  -Current home readings:    137/57, HR 62   136/60, HR 71   150/60, HR 63  -Current dietary habits: She has been eating less, trying to follow a low-carb, low-sodium diet.   -Current exercise habits: Unable to walk long distances.  -Denies hypotensive/hypertensive symptoms -Recommended to continue current medication  Atrial Fibrillation (Goal: prevent stroke and major bleeding) -Controlled:  -CHADSVASC: 5 -Current treatment: Rate control: Diltiazem CD 240 mg daily  Anticoagulation: Eliquis 5 mg twice daily  -Medications previously tried: NA -Home BP and HR readings: NA  -Counseled on avoidance of NSAIDs due to increased bleeding risk with anticoagulants; -Recommended to continue current medication  Hyperlipidemia: (LDL goal < 70) -Controlled -Current treatment: Pravastatin 80 mg daily  -Medications previously tried: NA  -Educated on Importance of limiting foods high in cholesterol; -Recommended to continue current medication  Diabetes (A1c goal <8%) -Not ideally controlled -Current medications: Trulicity 1.5 mg weekly: Appropriate, Effective, Safe, Accessible   Has received 3 mg, finishing up supply of 1.5 mg  pens. Humalog 75-25 10 units twice daily before meals: Appropriate, Effective, Safe, Accessible  Gives extra 2 units if blood sugar greater than 150 -Medications previously tried: Metformin, Farxiga (Hives) -Current home glucose readings: 118, 128, 135, 146, 148, 114, 124, 132, 123 -Denies hypoglycemic symptoms Continue current medications  Asthma (Goal: control symptoms and prevent exacerbations) -Controlled -Current treatment  Proventil HFA 2 puffs every 6 hours as needed Advair 1 puff twice daily  -Medications previously tried: NA  -Exacerbations requiring treatment in last 6 months: None -Patient reports consistent use of maintenance inhaler -Frequency of rescue inhaler use: NA -Counseled on When to use rescue inhaler -Recommended to continue current medication  Chronic Kidney Disease Stage 3a  -All medications assessed for renal dosing and appropriateness in chronic kidney disease. -Recommended to continue current medication  Patient Goals/Self-Care Activities Over the next 90 days, patient will:  - check glucose 3 times daily, before breakfast, supper, and bedtime, document, and provide at future appointments check blood pressure 2-3 times weekly, document, and provide at future appointments  Follow Up Plan: Telephone follow up appointment with care management team member scheduled for:  07/07/2022 at 9:15 AM      Medication Assistance:  Trulicity, Humalog obtained through [employer] medication assistance program.  Enrollment ends Dec 2023  Patient's preferred pharmacy is:  [facility] [place], [place] [address] [place] [address] Phone: [phone] Fax: [place], [place] [address] [place] [address] Phone: [phone] Fax: [place], [place] [address] Phone: [phone] Fax: [phone]   Uses pill box? Yes Pt endorses 100% compliance  We discussed: Current pharmacy is preferred with insurance plan and patient is satisfied with pharmacy services Patient decided to: Continue current medication management strategy  Care Plan and Follow Up Patient Decision:  Patient agrees to Care Plan and Follow-up.  Plan: Telephone follow up appointment with care management team member scheduled for:  07/07/2022 at [place], [place], [place] Pharmacist Practitioner  [facility] [phone]

## 2022-03-31 NOTE — Patient Instructions (Signed)
Visit Information It was great speaking with you today!  Please let me know if you have any questions about our visit.   Goals Addressed             This Visit's Progress    Monitor and Manage My Blood Sugar-Diabetes Type 2   On track    Timeframe:  Long-Range Goal Priority:  High Start Date:  10/29/2020                           Expected End Date:  05/01/2022                     Follow Up within 90 days   -check blood sugar three times daily: Before breakfast, before supper, and at bedtime - check blood sugar if I feel it is too high or too low - enter blood sugar readings and medication or insulin into daily log    Why is this important?   Checking your blood sugar at home helps to keep it from getting very high or very low.  Writing the results in a diary or log helps the doctor know how to care for you.  Your blood sugar log should have the time, date and the results.  Also, write down the amount of insulin or other medicine that you take.  Other information, like what you ate, exercise done and how you were feeling, will also be helpful.     Notes:      Track and Manage My Blood Pressure-Hypertension   On track    Timeframe:  Long-Range Goal Priority:  High Start Date:  12/17/2020                            Expected End Date:  06/18/2022                    Follow Up within 90 days   - check blood pressure 3 times per week    Why is this important?   You won't feel high blood pressure, but it can still hurt your blood vessels.  High blood pressure can cause heart or kidney problems. It can also cause a stroke.  Making lifestyle changes like losing a little weight or eating less salt will help.  Checking your blood pressure at home and at different times of the day can help to control blood pressure.  If the doctor prescribes medicine remember to take it the way the doctor ordered.  Call the office if you cannot afford the medicine or if there are questions about it.      Notes:         Patient Care Plan: General Pharmacy (Adult)     Problem Identified: Hypertension, Hyperlipidemia, Diabetes, Atrial Fibrillation, Asthma, Chronic Kidney Disease and Gout   Priority: High     Long-Range Goal: Patient-Specific Goal   Start Date: 10/29/2020  Expected End Date: 04/01/2023  This Visit's Progress: On track  Recent Progress: On track  Priority: High  Note:   Current Barriers:  Unable to independently afford treatment regimen  Pharmacist Clinical Goal(s):  Over the next 90 days, patient will maintain control of diabetes as evidenced by A1c less than 8%  through collaboration with PharmD and provider.   Interventions: 1:1 collaboration with Steele Sizer, MD regarding development and update of comprehensive plan of care as evidenced by provider  attestation and co-signature Inter-disciplinary care team collaboration (see longitudinal plan of care) Comprehensive medication review performed; medication list updated in electronic medical record  Hypertension (BP goal <130/80) -Controlled -Current treatment: Diltiazem CD 240 mg daily: Appropriate, Effective, Safe, Accessible  Furosemide 40 mg twice daily as needed: Appropriate, Effective, Query Safe Irbersartan 300 mg daily: Appropriate, Effective, Safe, Accessible  -Medications previously tried: NA  -Current home readings:    137/57, HR 62   136/60, HR 71   150/60, HR 63  -Current dietary habits: She has been eating less, trying to follow a low-carb, low-sodium diet.   -Current exercise habits: Unable to walk long distances.  -Denies hypotensive/hypertensive symptoms -Recommended to continue current medication  Atrial Fibrillation (Goal: prevent stroke and major bleeding) -Controlled:  -CHADSVASC: 5 -Current treatment: Rate control: Diltiazem CD 240 mg daily  Anticoagulation: Eliquis 5 mg twice daily  -Medications previously tried: NA -Home BP and HR readings: NA  -Counseled on avoidance of  NSAIDs due to increased bleeding risk with anticoagulants; -Recommended to continue current medication  Hyperlipidemia: (LDL goal < 70) -Controlled -Current treatment: Pravastatin 80 mg daily  -Medications previously tried: NA  -Educated on Importance of limiting foods high in cholesterol; -Recommended to continue current medication  Diabetes (A1c goal <8%) -Not ideally controlled -Current medications: Trulicity 1.5 mg weekly: Appropriate, Effective, Safe, Accessible   Has received 3 mg, finishing up supply of 1.5 mg pens. Humalog 75-25 10 units twice daily before meals: Appropriate, Effective, Safe, Accessible  Gives extra 2 units if blood sugar greater than 150 -Medications previously tried: Metformin, Farxiga (Hives) -Current home glucose readings: 118, 128, 135, 146, 148, 114, 124, 132, 123 -Denies hypoglycemic symptoms Continue current medications  Asthma (Goal: control symptoms and prevent exacerbations) -Controlled -Current treatment  Proventil HFA 2 puffs every 6 hours as needed Advair 1 puff twice daily  -Medications previously tried: NA  -Exacerbations requiring treatment in last 6 months: None -Patient reports consistent use of maintenance inhaler -Frequency of rescue inhaler use: NA -Counseled on When to use rescue inhaler -Recommended to continue current medication  Chronic Kidney Disease Stage 3a  -All medications assessed for renal dosing and appropriateness in chronic kidney disease. -Recommended to continue current medication  Patient Goals/Self-Care Activities Over the next 90 days, patient will:  - check glucose 3 times daily, before breakfast, supper, and bedtime, document, and provide at future appointments check blood pressure 2-3 times weekly, document, and provide at future appointments  Follow Up Plan: Telephone follow up appointment with care management team member scheduled for:  07/07/2022 at 9:15 AM    Patient agreed to services and verbal  consent obtained.   Patient verbalizes understanding of instructions and care plan provided today and agrees to view in [place]. Active MyChart status and patient understanding of how to access instructions and care plan via MyChart confirmed with patient.     Malva Limes, [place] Pharmacist Practitioner  [facility] [phone]

## 2022-03-31 NOTE — Telephone Encounter (Signed)
PAF for Eliquis placed on Dr. Tyrell Antonio desk to be signed.

## 2022-04-02 NOTE — Telephone Encounter (Signed)
PAF for Eliquis faxed to [employer]. Fax confirmation received. PAF filed in Programme researcher, broadcasting/film/video.

## 2022-04-08 ENCOUNTER — Other Ambulatory Visit: Payer: Self-pay | Admitting: *Deleted

## 2022-04-08 DIAGNOSIS — I4891 Unspecified atrial fibrillation: Secondary | ICD-10-CM

## 2022-04-08 MED ORDER — APIXABAN 5 MG PO TABS: 5.0000 mg | ORAL_TABLET | Freq: Two times a day (BID) | ORAL | 2 refills | Status: DC

## 2022-04-08 NOTE — Telephone Encounter (Signed)
Eliquis '5mg'$  refill request received. Patient is 76 years old, weight-157.4kg, Crea-1.27 on 02/25/2022, Diagnosis-Afib, and last seen by Dr. Fletcher Anon on 10/29/2021. Dose is appropriate based on dosing criteria. Will send in refill to requested pharmacy.

## 2022-04-21 DIAGNOSIS — H43813 Vitreous degeneration, bilateral: Secondary | ICD-10-CM | POA: Diagnosis not present

## 2022-04-22 DIAGNOSIS — E1122 Type 2 diabetes mellitus with diabetic chronic kidney disease: Secondary | ICD-10-CM

## 2022-04-22 DIAGNOSIS — I129 Hypertensive chronic kidney disease with stage 1 through stage 4 chronic kidney disease, or unspecified chronic kidney disease: Secondary | ICD-10-CM | POA: Diagnosis not present

## 2022-04-22 DIAGNOSIS — E785 Hyperlipidemia, unspecified: Secondary | ICD-10-CM

## 2022-04-22 DIAGNOSIS — N1831 Chronic kidney disease, stage 3a: Secondary | ICD-10-CM

## 2022-04-22 DIAGNOSIS — I4891 Unspecified atrial fibrillation: Secondary | ICD-10-CM

## 2022-04-22 DIAGNOSIS — Z794 Long term (current) use of insulin: Secondary | ICD-10-CM

## 2022-04-22 DIAGNOSIS — J45909 Unspecified asthma, uncomplicated: Secondary | ICD-10-CM | POA: Diagnosis not present

## 2022-05-03 ENCOUNTER — Other Ambulatory Visit: Payer: Self-pay | Admitting: Family Medicine

## 2022-05-03 DIAGNOSIS — E1122 Type 2 diabetes mellitus with diabetic chronic kidney disease: Secondary | ICD-10-CM

## 2022-05-05 NOTE — Progress Notes (Signed)
Cardiology Office Note   Date:  05/06/2022   ID:  Amber Reyes, DOB July 23, 1946, MRN [id number]  PCP:  Steele Sizer, MD  Cardiologist:   Kathlyn [facility], MD   Chief Complaint  Patient presents with   Other    2 month f/u no complaints today. Meds reviewed verbally with pt.      History of Present Illness: Amber Reyes is a 76 y.o. female who presents for a followup visit regarding chronic diastolic heart failure and chronic atrial fibrillation. 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  Atrial fibrillation is being treated with rate control and anticoagulation.  She was seen in our office in July after worsening of shortness of breath after she was treated with [place] and had allergic reaction with a rash.  Her labs showed normal BNP.  She took some extra doses of Lasix and subsequently improved.  She feels back to baseline.  She takes furosemide on average of twice weekly now.  She denies chest pain.   Past Medical History:  Diagnosis Date   Allergic rhinitis, cause unspecified    Anxiety    Chronic diastolic CHF (congestive heart failure) (HCC)    CKD (chronic kidney disease), stage III (HCC)    Edema    Essential hypertension, benign    Gout, unspecified    Heart murmur    as child   Hyperlipidemia    Lipoma of unspecified site    Other and unspecified disc disorder of lumbar region    Other general symptoms(780.99)    PAC (premature atrial contraction)    Renal insufficiency    Restless legs syndrome (RLS)    Sebaceous cyst    Super obesity    Type II or unspecified type diabetes mellitus without mention of complication, uncontrolled    Unspecified asthma(493.90)    Unspecified disease of pericardium    Unspecified sleep apnea    Unspecified vitamin D deficiency     Vaginitis and vulvovaginitis, unspecified    Venous insufficiency     Past Surgical History:  Procedure Laterality Date   CARDIAC CATHETERIZATION  2002   [facility]   CATARACT EXTRACTION W/PHACO Right 09/15/2021   Procedure: CATARACT EXTRACTION PHACO AND INTRAOCULAR LENS PLACEMENT (IOC) RIGHT 10.19 01:01.8;  Surgeon: Birder Robson, MD;  Location: [facility];  Service: Ophthalmology;  Laterality: Right;   CATARACT EXTRACTION W/PHACO Left 09/29/2021   Procedure: CATARACT EXTRACTION PHACO AND INTRAOCULAR LENS PLACEMENT (IOC) LEFT DIABETIC 11.40 01:21.2;  Surgeon: Birder Robson, MD;  Location: [facility];  Service: Ophthalmology;  Laterality: Left;   COLONOSCOPY WITH PROPOFOL N/A 09/25/2018   Procedure: COLONOSCOPY WITH PROPOFOL;  Surgeon: Jonathon Bellows, MD;  Location[facility] ENDOSCOPY;  Service: Gastroenterology;  Laterality: N/A;   COLONOSCOPY WITH PROPOFOL N/A 04/13/2019   Procedure: COLONOSCOPY WITH PROPOFOL;  Surgeon: Jonathon Bellows, MD;  Location[facility] ENDOSCOPY;  Service: Gastroenterology;  Laterality: N/A;   COLONOSCOPY WITH PROPOFOL N/A 06/27/2020   Procedure: COLONOSCOPY WITH PROPOFOL;  Surgeon: Jonathon Bellows, MD;  Location: [facility] ENDOSCOPY;  Service: Gastroenterology;  Laterality: N/A;   PERICARDIUM SURGERY       Current Outpatient Medications  Medication Sig Dispense Refill   acetaminophen (TYLENOL) 650 MG CR tablet Take 650 mg by mouth in the morning and at bedtime.     albuterol (PROVENTIL HFA;VENTOLIN HFA) 108 (90 BASE) MCG/ACT inhaler Inhale 2  puffs into the lungs every 6 (six) hours as needed for wheezing or shortness of breath.     Alcohol Swabs (DROPSAFE ALCOHOL PREP) 70 % PADS USE AS DIRECTED 300 each 3   allopurinol (ZYLOPRIM) 100 MG tablet Take 1 tablet (100 mg total) by mouth 2 (two) times daily. 180 tablet 1   apixaban (ELIQUIS) 5 MG TABS tablet Take 1 tablet (5 mg total) by mouth 2 (two) times daily. 180 tablet 2   Blood Glucose Calibration (TRUE METRIX LEVEL 1)  Low SOLN 1 each by In Vitro route once a week. 1 each 1   Blood Glucose Monitoring Suppl (TRUE METRIX AIR GLUCOSE METER) w/Device KIT Inject 1 each into the skin 4 (four) times daily as needed. Check fsbs 4 times daily E11.22 1 kit 0   Cholecalciferol (VITAMIN D) 2000 UNITS tablet Take 2,000 Units by mouth daily.     colchicine (COLCRYS) 0.6 MG tablet Take 1-2 tablets (0.6-1.2 mg total) by mouth daily as needed. 30 tablet 0   DROPLET PEN NEEDLES 30G X 8 MM MISC USE TWICE DAILY WITH INSULIN 200 each 3   Dulaglutide (TRULICITY) 3 WG/9.5AO SOPN Inject 3 mg as directed once a week. 6 mL 3   Elastic Bandages & Supports (MEDICAL COMPRESSION STOCKINGS) MISC 2 each by Does not apply route daily. 2 each 2   fluticasone (FLONASE) 50 MCG/ACT nasal spray Place 2 sprays into both nostrils daily. 48 g 2   fluticasone-salmeterol (ADVAIR) 250-50 MCG/ACT AEPB INHALE 1 PUFF BY MOUTH TWICE DAILY 180 each 1   furosemide (LASIX) 40 MG tablet TAKE 1 TABLET (40 MG TOTAL) BY MOUTH 2 (TWO) TIMES DAILY AS NEEDED. 180 tablet 1   Insulin Lispro Prot & Lispro (HUMALOG MIX 75/25 KWIKPEN) (75-25) 100 UNIT/ML Kwikpen Inject 10 Units into the skin 2 (two) times daily with a meal.     irbesartan (AVAPRO) 300 MG tablet Take 1 tablet (300 mg total) by mouth daily. 90 tablet 1   loratadine (CLARITIN) 10 MG tablet Take 1 tablet (10 mg total) by mouth daily. 30 tablet 11   potassium chloride SA (KLOR-CON) 20 MEQ tablet Take 1 tablet (20 mEq total) by mouth daily. With furosemide 90 tablet 1   pravastatin (PRAVACHOL) 80 MG tablet Take 1 tablet (80 mg total) by mouth every evening. 90 tablet 1   TRUE METRIX BLOOD GLUCOSE TEST test strip CHECK BLOOD SUGAR FOUR TIMES DAILY 400 strip 0   TRUEplus Lancets 33G MISC TEST BLOOD SUGAR THREE TIMES DAILY AS NEEDED 100 each 3   TYLENOL 325 MG tablet      diltiazem (CARDIZEM CD) 240 MG 24 hr capsule Take 1 capsule (240 mg total) by mouth daily. 90 capsule 3   No current facility-administered  medications for this visit.    Allergies:   Farxiga [dapagliflozin]    Social History:  The patient  reports that she has never smoked. She has never used smokeless tobacco. She reports that she does not drink alcohol and does not use drugs.   Family History:  The patient's family history includes Heart attack in her mother; Hypertension in her mother.    ROS:  Please see the history of present illness.   Otherwise, review of systems are positive for none.   All other systems are reviewed and negative.    PHYSICAL EXAM: VS:  BP 128/70 (BP Location: Left Arm, Patient Position: Sitting, Cuff Size: Large)   Pulse 70   Ht '5\' 10"'  (1.778 m)  Wt (!) 354 lb 4 oz (160.7 kg)   SpO2 99%   BMI 50.83 kg/m  , BMI Body mass index is 50.83 kg/m. GEN: Well nourished, well developed, in no acute distress  HEENT: normal  Neck: no JVD, carotid bruits, or masses Cardiac: Irregularly irregular; no rubs, or gallops.  1/ 6 systolic murmur in the aortic area.  Trace bilateral leg edema. Respiratory:  clear to auscultation bilaterally, normal work of breathing GI: soft, nontender, nondistended, + BS MS: no deformity or atrophy  Skin: warm and dry, no rash Neuro:  Strength and sensation are intact Psych: euthymic mood, full affect   EKG:  EKG is ordered today. The ekg ordered today demonstrates atrial fibrillation with ventricular rate of 70 bpm.  Poor R wave progression in the inferior leads.    Recent Labs: 07/14/2021: Hemoglobin 10.8; Platelets 228 11/11/2021: ALT 6 02/25/2022: Brain Natriuretic Peptide 61; BUN 31; Creat 1.27; Potassium 4.3; Sodium 140    Lipid Panel    Component Value Date/Time   CHOL 134 07/14/2021 0901   CHOL 139 04/16/2015 1138   TRIG 78 07/14/2021 0901   HDL 51 07/14/2021 0901   HDL 52 04/16/2015 1138   CHOLHDL 2.6 07/14/2021 0901   VLDL 19 12/23/2016 0941   LDLCALC 67 07/14/2021 0901      Wt Readings from Last 3 Encounters:  05/06/22 (!) 354 lb 4 oz (160.7  kg)  03/16/22 (!) 347 lb (157.4 kg)  02/26/22 (!) 350 lb 6 oz (158.9 kg)           No data to display            ASSESSMENT AND PLAN:  1.  Chronic atrial fibrillation:  Chads Vasc score is 5 .  Continue anticoagulation with Eliquis.  Most recent labs in July showed a creatinine of 1.27 which is stable.  Ventricular rate is controlled on diltiazem.  2. Chronic diastolic heart failure: She uses furosemide on average of 3 times per week and she currently appears to be euvolemic.  3. Essential hypertension: Blood pressure is well controlled on current medications.  4.  Obstructive sleep apnea: She uses CPAP on a regular basis.   Disposition:   FU with me in 6 months  Signed,  Kathlyn [facility], MD  05/06/2022 10:42 AM    [place]

## 2022-05-06 ENCOUNTER — Ambulatory Visit: Payer: Medicare PPO | Attending: Cardiovascular Disease | Admitting: Cardiovascular Disease

## 2022-05-06 ENCOUNTER — Encounter: Payer: Self-pay | Admitting: Cardiovascular Disease

## 2022-05-06 VITALS — BP 128/70 | HR 70 | Ht 70.0 in | Wt 354.2 lb

## 2022-05-06 DIAGNOSIS — I5032 Chronic diastolic (congestive) heart failure: Secondary | ICD-10-CM

## 2022-05-06 DIAGNOSIS — I10 Essential (primary) hypertension: Secondary | ICD-10-CM

## 2022-05-06 DIAGNOSIS — Z9989 Dependence on other enabling machines and devices: Secondary | ICD-10-CM | POA: Diagnosis not present

## 2022-05-06 DIAGNOSIS — I482 Chronic atrial fibrillation, unspecified: Secondary | ICD-10-CM | POA: Diagnosis not present

## 2022-05-06 DIAGNOSIS — G4733 Obstructive sleep apnea (adult) (pediatric): Secondary | ICD-10-CM

## 2022-05-06 MED ORDER — DILTIAZEM HCL ER COATED BEADS 240 MG PO CP24: 240.0000 mg | ORAL_CAPSULE | Freq: Every day | ORAL | 3 refills | Status: DC

## 2022-05-06 NOTE — Patient Instructions (Signed)
Medication Instructions:  Your physician recommends that you continue on your current medications as directed. Please refer to the Current Medication list given to you today.  *If you need a refill on your cardiac medications before your next appointment, please call your pharmacy*   Lab Work: None ordered  If you have labs (blood work) drawn today and your tests are completely normal, you will receive your results only by: MyChart Message (if you have MyChart) OR A paper copy in the mail If you have any lab test that is abnormal or we need to change your treatment, we will call you to review the results.   Testing/Procedures: None ordered   Follow-Up: At [facility], you and your health needs are our priority.  As part of our continuing mission to provide you with exceptional heart care, we have created designated Provider Care Teams.  These Care Teams include your primary Cardiologist (physician) and Advanced Practice Providers (APPs -  Physician Assistants and Nurse Practitioners) who all work together to provide you with the care you need, when you need it.  We recommend signing up for the patient portal called "MyChart".  Sign up information is provided on this After Visit Summary.  MyChart is used to connect with patients for Virtual Visits (Telemedicine).  Patients are able to view lab/test results, encounter notes, upcoming appointments, etc.  Non-urgent messages can be sent to your provider as well.   To learn more about what you can do with MyChart, go to [email or web address].    Your next appointment:   6 month(s)  The format for your next appointment:   In Person  Provider:   You may see Muhammad Arida, MD or one of the following Advanced Practice Providers on your designated Care Team:   Christopher Berge, NP Ryan Dunn, PA-C Cadence Furth, PA-C Sheri Hammock, NP   Other Instructions N/A  Important Information About Sugar       

## 2022-05-26 ENCOUNTER — Other Ambulatory Visit: Payer: Self-pay | Admitting: Family Medicine

## 2022-05-26 DIAGNOSIS — E1129 Type 2 diabetes mellitus with other diabetic kidney complication: Secondary | ICD-10-CM

## 2022-05-28 ENCOUNTER — Telehealth: Payer: Self-pay

## 2022-05-28 NOTE — Progress Notes (Signed)
Chronic Care Management Pharmacy Assistant   Name: Amber Reyes  MRN: [id number] DOB: [birth year]  Reason for Encounter: 2024 Patient Assistance Application Renewal   Patient is currently receiving patient assistance for her Trulicity and Humalog 24/23 through the [employer] Patient Assistance Program. The program requires all Medicare patient's to renew their applications starting 53/61/4431.  Renewal Applications started, and will be mailed to the patient's home address for her to complete. Once completed by the patient she will be instructed to return the application along with her proof of income to [place] at [facility].  Applications e-mailed to PTM so she can mail the application to the patient's home address. Patient can contact me directly @ [phone] if she has any questions.   Medications: Outpatient Encounter Medications as of 05/28/2022  Medication Sig   acetaminophen (TYLENOL) 650 MG CR tablet Take 650 mg by mouth in the morning and at bedtime.   albuterol (PROVENTIL HFA;VENTOLIN HFA) 108 (90 BASE) MCG/ACT inhaler Inhale 2 puffs into the lungs every 6 (six) hours as needed for wheezing or shortness of breath.   Alcohol Swabs (DROPSAFE ALCOHOL PREP) 70 % PADS USE AS DIRECTED   allopurinol (ZYLOPRIM) 100 MG tablet Take 1 tablet (100 mg total) by mouth 2 (two) times daily.   apixaban (ELIQUIS) 5 MG TABS tablet Take 1 tablet (5 mg total) by mouth 2 (two) times daily.   Blood Glucose Calibration (TRUE METRIX LEVEL 1) Low SOLN 1 each by In Vitro route once a week.   Blood Glucose Monitoring Suppl (TRUE METRIX AIR GLUCOSE METER) w/Device KIT Inject 1 each into the skin 4 (four) times daily as needed. Check fsbs 4 times daily E11.22   Cholecalciferol (VITAMIN D) 2000 UNITS tablet Take 2,000 Units by mouth daily.   colchicine (COLCRYS) 0.6 MG tablet Take 1-2 tablets (0.6-1.2 mg total) by mouth daily as needed.   diltiazem (CARDIZEM CD) 240 MG 24 hr  capsule Take 1 capsule (240 mg total) by mouth daily.   DROPLET PEN NEEDLES 30G X 8 MM MISC USE TWICE DAILY WITH INSULIN   Dulaglutide (TRULICITY) 3 JK/9.3OI SOPN Inject 3 mg as directed once a week.   Elastic Bandages & Supports (MEDICAL COMPRESSION STOCKINGS) MISC 2 each by Does not apply route daily.   fluticasone (FLONASE) 50 MCG/ACT nasal spray Place 2 sprays into both nostrils daily.   fluticasone-salmeterol (ADVAIR) 250-50 MCG/ACT AEPB INHALE 1 PUFF BY MOUTH TWICE DAILY   furosemide (LASIX) 40 MG tablet TAKE 1 TABLET (40 MG TOTAL) BY MOUTH 2 (TWO) TIMES DAILY AS NEEDED.   Insulin Lispro Prot & Lispro (HUMALOG MIX 75/25 KWIKPEN) (75-25) 100 UNIT/ML Kwikpen Inject 10 Units into the skin 2 (two) times daily with a meal.   irbesartan (AVAPRO) 300 MG tablet Take 1 tablet (300 mg total) by mouth daily.   loratadine (CLARITIN) 10 MG tablet Take 1 tablet (10 mg total) by mouth daily.   potassium chloride SA (KLOR-CON) 20 MEQ tablet Take 1 tablet (20 mEq total) by mouth daily. With furosemide   pravastatin (PRAVACHOL) 80 MG tablet Take 1 tablet (80 mg total) by mouth every evening.   TRUE METRIX BLOOD GLUCOSE TEST test strip CHECK BLOOD SUGAR FOUR TIMES DAILY   TRUEplus Lancets 33G MISC TEST BLOOD SUGAR THREE TIMES DAILY AS NEEDED   TYLENOL 325 MG tablet    No facility-administered encounter medications on file as of 05/28/2022.   [name], CPA/CMA Clinical Pharmacist Assistant Phone: [phone]

## 2022-06-24 ENCOUNTER — Ambulatory Visit (INDEPENDENT_AMBULATORY_CARE_PROVIDER_SITE_OTHER): Payer: Medicare PPO

## 2022-06-24 DIAGNOSIS — Z0000 Encounter for general adult medical examination without abnormal findings: Secondary | ICD-10-CM

## 2022-06-24 NOTE — Progress Notes (Signed)
Subjective:  I connected with  Amber Reyes on 06/24/22 by a audio enabled telemedicine application and verified that I am speaking with the correct person using two identifiers.  Patient Location: Home  Provider Location: Office/Clinic  I discussed the limitations of evaluation and management by telemedicine. The patient expressed understanding and agreed to proceed.  Amber Reyes is a 76 y.o. female who presents for Medicare Annual (Subsequent) preventive examination.  Review of Systems    Defer to PCP       Objective:    There were no vitals filed for this visit. There is no height or weight on file to calculate BMI.     09/29/2021    6:38 AM 06/23/2021    8:25 AM 06/27/2020    8:16 AM 06/19/2020    8:26 AM 06/15/2019    8:24 AM 04/13/2019    7:43 AM 09/25/2018    7:28 AM  Advanced Directives  Does Patient Have a Medical Advance Directive? _0  No No  Would patient like information on creating a medical advance directive? Yes (MAU/Ambulatory/Procedural Areas - Information given) Yes (MAU/Ambulatory/Procedural Areas - Information given)  Yes (MAU/Ambulatory/Procedural Areas - Information given) Yes (MAU/Ambulatory/Procedural Areas - Information given) No - Patient declined     Current Medications (verified) Outpatient Encounter Medications as of 06/24/2022  Medication Sig   acetaminophen (TYLENOL) 650 MG CR tablet Take 650 mg by mouth in the morning and at bedtime.   albuterol (PROVENTIL HFA;VENTOLIN HFA) 108 (90 BASE) MCG/ACT inhaler Inhale 2 puffs into the lungs every 6 (six) hours as needed for wheezing or shortness of breath.   Alcohol Swabs (DROPSAFE ALCOHOL PREP) 70 % PADS USE AS DIRECTED   allopurinol (ZYLOPRIM) 100 MG tablet Take 1 tablet (100 mg total) by mouth 2 (two) times daily.   apixaban (ELIQUIS) 5 MG TABS tablet Take 1 tablet (5 mg total) by mouth 2 (two) times daily.   Blood Glucose Calibration (TRUE METRIX LEVEL 1) Low SOLN 1 each by In  Vitro route once a week.   Blood Glucose Monitoring Suppl (TRUE METRIX AIR GLUCOSE METER) w/Device KIT Inject 1 each into the skin 4 (four) times daily as needed. Check fsbs 4 times daily E11.22   Cholecalciferol (VITAMIN D) 2000 UNITS tablet Take 2,000 Units by mouth daily.   colchicine (COLCRYS) 0.6 MG tablet Take 1-2 tablets (0.6-1.2 mg total) by mouth daily as needed.   diltiazem (CARDIZEM CD) 240 MG 24 hr capsule Take 1 capsule (240 mg total) by mouth daily.   DROPLET PEN NEEDLES 30G X 8 MM MISC USE TWICE DAILY WITH INSULIN   Dulaglutide (TRULICITY) 3 MG/8.6PY SOPN Inject 3 mg as directed once a week.   Elastic Bandages & Supports (MEDICAL COMPRESSION STOCKINGS) MISC 2 each by Does not apply route daily.   fluticasone (FLONASE) 50 MCG/ACT nasal spray Place 2 sprays into both nostrils daily.   fluticasone-salmeterol (ADVAIR) 250-50 MCG/ACT AEPB INHALE 1 PUFF BY MOUTH TWICE DAILY   furosemide (LASIX) 40 MG tablet TAKE 1 TABLET (40 MG TOTAL) BY MOUTH 2 (TWO) TIMES DAILY AS NEEDED.   Insulin Lispro Prot & Lispro (HUMALOG MIX 75/25 KWIKPEN) (75-25) 100 UNIT/ML Kwikpen Inject 10 Units into the skin 2 (two) times daily with a meal.   irbesartan (AVAPRO) 300 MG tablet Take 1 tablet (300 mg total) by mouth daily.   loratadine (CLARITIN) 10 MG tablet Take 1 tablet (10 mg total) by mouth daily.   potassium chloride SA (  KLOR-CON) 20 MEQ tablet Take 1 tablet (20 mEq total) by mouth daily. With furosemide   pravastatin (PRAVACHOL) 80 MG tablet Take 1 tablet (80 mg total) by mouth every evening.   TRUE METRIX BLOOD GLUCOSE TEST test strip CHECK BLOOD SUGAR FOUR TIMES DAILY   TRUEplus Lancets 33G MISC TEST BLOOD SUGAR THREE TIMES DAILY AS NEEDED   TYLENOL 325 MG tablet    No facility-administered encounter medications on file as of 06/24/2022.    Allergies (verified) Farxiga [dapagliflozin]   History: Past Medical History:  Diagnosis Date   Allergic rhinitis, cause unspecified    Anxiety     Chronic diastolic CHF (congestive heart failure) (HCC)    CKD (chronic kidney disease), stage III (HCC)    Edema    Essential hypertension, benign    Gout, unspecified    Heart murmur    as child   Hyperlipidemia    Lipoma of unspecified site    Other and unspecified disc disorder of lumbar region    Other general symptoms(780.99)    PAC (premature atrial contraction)    Renal insufficiency    Restless legs syndrome (RLS)    Sebaceous cyst    Super obesity    Type II or unspecified type diabetes mellitus without mention of complication, uncontrolled    Unspecified asthma(493.90)    Unspecified disease of pericardium    Unspecified sleep apnea    Unspecified vitamin D deficiency    Vaginitis and vulvovaginitis, unspecified    Venous insufficiency    Past Surgical History:  Procedure Laterality Date   CARDIAC CATHETERIZATION  2002   [facility]   CATARACT EXTRACTION W/PHACO Right 09/15/2021   Procedure: CATARACT EXTRACTION PHACO AND INTRAOCULAR LENS PLACEMENT (IOC) RIGHT 10.19 01:01.8;  Surgeon: Birder Robson, MD;  Location: [place];  Service: Ophthalmology;  Laterality: Right;   CATARACT EXTRACTION W/PHACO Left 09/29/2021   Procedure: CATARACT EXTRACTION PHACO AND INTRAOCULAR LENS PLACEMENT (IOC) LEFT DIABETIC 11.40 01:21.2;  Surgeon: Birder Robson, MD;  Location: [place];  Service: Ophthalmology;  Laterality: Left;   COLONOSCOPY WITH PROPOFOL N/A 09/25/2018   Procedure: COLONOSCOPY WITH PROPOFOL;  Surgeon: Jonathon Bellows, MD;  Location: [facility] ENDOSCOPY;  Service: Gastroenterology;  Laterality: N/A;   COLONOSCOPY WITH PROPOFOL N/A 04/13/2019   Procedure: COLONOSCOPY WITH PROPOFOL;  Surgeon: Jonathon Bellows, MD;  Location: [facility] ENDOSCOPY;  Service: Gastroenterology;  Laterality: N/A;   COLONOSCOPY WITH PROPOFOL N/A 06/27/2020   Procedure: COLONOSCOPY WITH PROPOFOL;  Surgeon: Jonathon Bellows, MD;  Location: [facility] ENDOSCOPY;  Service: Gastroenterology;  Laterality: N/A;    PERICARDIUM SURGERY     Family History  Problem Relation Age of Onset   Heart attack Mother    Hypertension Mother    Breast cancer Neg Hx    Social History   Socioeconomic History   Marital status: Married    Spouse name: Not on file   Number of children: 3   Years of education: Not on file   Highest education level: Associate degree: academic program  Occupational History   Occupation: retired  Tobacco Use   Smoking status: Never   Smokeless tobacco: Never   Tobacco comments:    n/a  Vaping Use   Vaping Use: Never used  Substance and Sexual Activity   Alcohol use: No    Alcohol/week: 0.0 standard drinks of alcohol   Drug use: No   Sexual activity: Not Currently  Other Topics Concern   Not on file  Social History Narrative   Not on  file   Social Determinants of Health   Financial Resource Strain: Low Risk  (01/06/2022)   Overall Financial Resource Strain (CARDIA)    Difficulty of Paying Living Expenses: Not hard at all  Food Insecurity: No Food Insecurity (06/23/2021)   Hunger Vital Sign    Worried About Running Out of Food in the Last Year: Never true    Ran Out of Food in the Last Year: Never true  Transportation Needs: No Transportation Needs (06/23/2021)   PRAPARE - Hydrologist (Medical): No    Lack of Transportation (Non-Medical): No  Physical Activity: Inactive (06/23/2021)   Exercise Vital Sign    Days of Exercise per Week: 0 days    Minutes of Exercise per Session: 0 min  Stress: No Stress Concern Present (06/23/2021)   [place]    Feeling of Stress : Not at all  Social Connections: [place] (06/23/2021)   Social Connection and Isolation Panel [NHANES]    Frequency of Communication with Friends and Family: More than three times a week    Frequency of Social Gatherings with Friends and Family: Once a week    Attends Religious Services: More than 4  times per year    Active Member of [employer] or Organizations: Yes    Attends Music therapist: More than 4 times per year    Marital Status: Married    Tobacco Counseling Counseling given: Not Answered Tobacco comments: n/a   Clinical Intake:                 Diabetic?Nutrition Risk Assessment:  Has the patient had any N/V/D within the last 2 months?  No  Does the patient have any non-healing wounds?  No  Has the patient had any unintentional weight loss or weight gain?  No   Diabetes:  Is the patient diabetic?  Yes  If diabetic, was a CBG obtained today?  Yes  Did the patient bring in their glucometer from home?  No  How often do you monitor your CBG's? Once daily.   Financial Strains and Diabetes Management:  Are you having any financial strains with the device, your supplies or your medication? Yes .  Does the patient want to be seen by Chronic Care Management for management of their diabetes?  Yes  Would the patient like to be referred to a Nutritionist or for Diabetic Management?  No   Diabetic Exams:  Diabetic Eye Exam: Completed 04/21/2022 Dr Lu Duffel Diabetic Foot Exam: Completed 03/16/2022           Activities of Daily Living    03/16/2022    8:05 AM 02/25/2022    1:49 PM  In your present state of health, do you have any difficulty performing the following activities:  Hearing? 0 0  Vision? 0 0  Difficulty concentrating or making decisions? 0 0  Walking or climbing stairs? 1 1  Dressing or bathing? 0 0  Doing errands, shopping? 0 0    Patient Care Team: Steele Sizer, MD as PCP - General (Family Medicine) Wellington Hampshire, MD as PCP - Cardiology (Cardiology) Wellington Hampshire, MD as Consulting Physician (Cardiology) Germaine Pomfret, [facility] as Pharmacist (Pharmacist)  Indicate any recent Medical Services you may have received from other than [facility] providers in the past year (date may be approximate).     Assessment:   This  is a routine wellness examination for [place].  Hearing/Vision  screen No results found.  Dietary issues and exercise activities discussed:     Goals Addressed   None   Depression Screen    03/16/2022    8:05 AM 02/25/2022    1:49 PM 02/19/2022    3:51 PM 11/11/2021    8:16 AM 07/14/2021    8:04 AM 06/23/2021    8:24 AM 03/09/2021    8:01 AM  PHQ 2/9 Scores  PHQ - 2 Score 0 0 0 0 0 0 0  PHQ- 9 Score 0 0 0 0 0      Fall Risk    03/16/2022    8:05 AM 02/25/2022    1:49 PM 02/19/2022    3:47 PM 11/11/2021    8:16 AM 07/14/2021    8:03 AM  [place] in the past year? 0 0 0 0 0  Number falls in past yr: 0 0 0 0 0  Injury with Fall? 0 0 0 0 0  Risk for fall due to : No Fall Risks   No Fall Risks No Fall Risks  Follow up Falls prevention discussed   Falls prevention discussed Falls prevention discussed    FALL RISK PREVENTION PERTAINING TO THE HOME:  Any stairs in or around the home? Yes  If so, are there any without handrails? No  Home free of loose throw rugs in walkways, pet beds, electrical cords, etc? Yes Adequate lighting in your home to reduce risk of falls? Yes   ASSISTIVE DEVICES UTILIZED TO PREVENT FALLS:  Life alert? No  Use of a cane, walker or w/c? Yes  Grab bars in the bathroom? Yes  Shower chair or bench in shower? Yes  Elevated toilet seat or a handicapped toilet? Yes   TIMED UP AND GO:  Was the test performed? No .  Length of time to ambulate 10 feet: N/A sec.    Cognitive Function:        06/15/2019    8:29 AM  6CIT Screen  What Year? 0 points  What month? 0 points  What time? 0 points  Count back from 20 0 points  Months in reverse 0 points  Repeat phrase 4 points  Total Score 4 points    Immunizations Immunization History  Administered Date(s) Administered   Fluad Quad(high Dose 65+) 05/14/2019, 05/12/2020, 07/14/2021   Influenza Split 05/16/2007   Influenza, High Dose Seasonal PF 06/24/2017, 04/25/2018   Influenza,  Seasonal, Injecte, Preservative Fre 05/07/2010, 04/27/2011   Influenza,inj,Quad PF,6+ Mos 08/02/2014, 04/15/2015, 05/22/2016   Influenza-Unspecified 08/02/2014   Moderna Sars-Covid-2 Vaccination 09/28/2019, 10/26/2019, 07/02/2020   Pneumococcal Conjugate-13 08/02/2014   Pneumococcal Polysaccharide-23 04/27/2011, 06/04/2016   Tdap 09/29/2012   Zoster, Live 09/29/2012    TDAP status: Up to date  Flu Vaccine status: Due, Education has been provided regarding the importance of this vaccine. Advised may receive this vaccine at local pharmacy or Health Dept. Aware to provide a copy of the vaccination record if obtained from local pharmacy or Health Dept. Verbalized acceptance and understanding.  Pneumococcal vaccine status: Up to date  Covid-19 vaccine status: Declined, Education has been provided regarding the importance of this vaccine but patient still declined. Advised may receive this vaccine at local pharmacy or Health Dept.or vaccine clinic. Aware to provide a copy of the vaccination record if obtained from local pharmacy or Health Dept. Verbalized acceptance and understanding.  Qualifies for Shingles Vaccine? Yes   Zostavax completed No   Shingrix Completed?: No.    Education  has been provided regarding the importance of this vaccine. Patient has been advised to call insurance company to determine out of pocket expense if they have not yet received this vaccine. Advised may also receive vaccine at local pharmacy or Health Dept. Verbalized acceptance and understanding.  Screening Tests Health Maintenance  Topic Date Due   Zoster Vaccines- Shingrix (1 of 2) Never done   COVID-19 Vaccine (4 - Moderna series) 08/27/2020   INFLUENZA VACCINE  03/23/2022   Diabetic kidney evaluation - Urine ACR  07/14/2022   OPHTHALMOLOGY EXAM  08/19/2022   HEMOGLOBIN A1C  09/16/2022   TETANUS/TDAP  09/29/2022   MAMMOGRAM  12/02/2022   Diabetic kidney evaluation - GFR measurement  02/26/2023   FOOT EXAM   03/17/2023   Medicare Annual Wellness (AWV)  06/25/2023   COLONOSCOPY (Pts 45-73yr Insurance coverage will need to be confirmed)  06/28/2023   Pneumonia Vaccine 76 Years old  Completed   DEXA SCAN  Completed   Hepatitis C Screening  Completed   HPV VACCINES  Aged Out   Fecal DNA (Cologuard)  Discontinued    Health Maintenance  Health Maintenance Due  Topic Date Due   Zoster Vaccines- Shingrix (1 of 2) Never done   COVID-19 Vaccine (4 - Moderna series) 08/27/2020   INFLUENZA VACCINE  03/23/2022   Diabetic kidney evaluation - Urine ACR  07/14/2022    Colorectal cancer screening: Type of screening: Colonoscopy. Completed 06/27/2020. Repeat every 5 years  Mammogram status: Completed 12/01/2021. Repeat every year  Bone Density status: Completed 04/15/2015. Results reflect: Bone density results: NORMAL. Repeat every 5 years.  Lung Cancer Screening: (Low Dose CT Chest recommended if Age 74-80years, 30 pack-year currently smoking OR have quit w/in 15years.) does not qualify.   Lung Cancer Screening Referral: n/a  Additional Screening:  Hepatitis C Screening: does not qualify; Completed 04/02/2013  Vision Screening: Recommended annual ophthalmology exams for early detection of glaucoma and other disorders of the eye. Is the patient up to date with their annual eye exam?  Yes  Who is the provider or what is the name of the office in which the patient attends annual eye exams[facility] If pt is not established with a provider, would they like to be referred to a provider to establish care? No .   Dental Screening: Recommended annual dental exams for proper oral hygiene  Community Resource Referral / Chronic Care Management: CRR required this visit?  No   CCM required this visit?  Yes      Plan:     I have personally reviewed and noted the following in the patient's chart:   Medical and social history Use of alcohol, tobacco or illicit drugs  Current  medications and supplements including opioid prescriptions. Patient is not currently taking opioid prescriptions. Functional ability and status Nutritional status Physical activity Advanced directives List of other physicians Hospitalizations, surgeries, and ER visits in previous 12 months Vitals Screenings to include cognitive, depression, and falls Referrals and appointments  In addition, I have reviewed and discussed with patient certain preventive protocols, quality metrics, and best practice recommendations. A written personalized care plan for preventive services as well as general preventive health recommendations were provided to patient.     SRoyal Hawthorn CMA   06/24/2022   Nurse Notes:   Ms. WFincher, Thank you for taking time to come for your Medicare Wellness Visit. I appreciate your ongoing commitment to your health goals. Please review the following plan  we discussed and let me know if I can assist you in the future.   These are the goals we discussed:  Goals      Monitor and Manage My Blood Sugar-Diabetes Type 2     Timeframe:  Long-Range Goal Priority:  High Start Date:  10/29/2020                           Expected End Date:  05/01/2022                     Follow Up within 90 days   -check blood sugar three times daily: Before breakfast, before supper, and at bedtime - check blood sugar if I feel it is too high or too low - enter blood sugar readings and medication or insulin into daily log    Why is this important?   Checking your blood sugar at home helps to keep it from getting very high or very low.  Writing the results in a diary or log helps the doctor know how to care for you.  Your blood sugar log should have the time, date and the results.  Also, write down the amount of insulin or other medicine that you take.  Other information, like what you ate, exercise done and how you were feeling, will also be helpful.     Notes:      Track and Manage My  Blood Pressure-Hypertension     Timeframe:  Long-Range Goal Priority:  High Start Date:  12/17/2020                            Expected End Date:  06/18/2022                    Follow Up within 90 days   - check blood pressure 3 times per week    Why is this important?   You won't feel high blood pressure, but it can still hurt your blood vessels.  High blood pressure can cause heart or kidney problems. It can also cause a stroke.  Making lifestyle changes like losing a little weight or eating less salt will help.  Checking your blood pressure at home and at different times of the day can help to control blood pressure.  If the doctor prescribes medicine remember to take it the way the doctor ordered.  Call the office if you cannot afford the medicine or if there are questions about it.     Notes:         This is a list of the screening recommended for you and due dates:  Health Maintenance  Topic Date Due   Zoster (Shingles) Vaccine (1 of 2) Never done   COVID-19 Vaccine (4 - Moderna series) 08/27/2020   Flu Shot  03/23/2022   Yearly kidney health urinalysis for diabetes  07/14/2022   Eye exam for diabetics  08/19/2022   Hemoglobin A1C  09/16/2022   Tetanus Vaccine  09/29/2022   Mammogram  12/02/2022   Yearly kidney function blood test for diabetes  02/26/2023   Complete foot exam   03/17/2023   Medicare Annual Wellness Visit  06/25/2023   Colon Cancer Screening  06/28/2023   Pneumonia Vaccine  Completed   DEXA scan (bone density measurement)  Completed   Hepatitis C Screening: USPSTF Recommendation to screen - Ages 23-79 yo.  Completed  HPV Vaccine  Aged Out   Cologuard (Stool DNA test)  Discontinued

## 2022-07-05 ENCOUNTER — Ambulatory Visit (INDEPENDENT_AMBULATORY_CARE_PROVIDER_SITE_OTHER): Payer: Medicare PPO

## 2022-07-05 DIAGNOSIS — Z23 Encounter for immunization: Secondary | ICD-10-CM

## 2022-07-07 ENCOUNTER — Telehealth: Payer: Medicare PPO

## 2022-07-21 ENCOUNTER — Ambulatory Visit (INDEPENDENT_AMBULATORY_CARE_PROVIDER_SITE_OTHER): Payer: Medicare PPO

## 2022-07-21 DIAGNOSIS — I129 Hypertensive chronic kidney disease with stage 1 through stage 4 chronic kidney disease, or unspecified chronic kidney disease: Secondary | ICD-10-CM

## 2022-07-21 DIAGNOSIS — Z794 Long term (current) use of insulin: Secondary | ICD-10-CM

## 2022-07-21 NOTE — Progress Notes (Signed)
Chronic Care Management Pharmacy Note  07/22/2022 Name:  Amber Reyes MRN:  [id number] DOB:  Apr 18, 1946  Summary: Patient presents for CCM follow-up.   -Patient blood sugars mostly at goal with some elevations related to Thanksgiving and eating later suppers. She continues to adjust her insulin by 2 units if greater than 150. She has received Trulicity 3 mg and is tolerating it well, although she did report some nausea initially.   -Home blood pressures mildly elevated. Continue to monitor for now.   Recommendations/Changes made from today's visit: Continue current medications  Plan:  CPP follow-up in 3 months  Subjective: Amber Reyes is an 76 y.o. year old female who is a primary patient of Steele Sizer, MD.  The CCM team was consulted for assistance with disease management and care coordination needs.    Engaged with patient by telephone for follow up visit in response to provider referral for pharmacy case management and/or care coordination services.   Consent to Services:  The patient was given information about Chronic Care Management services, agreed to services, and gave verbal consent prior to initiation of services.  Please see initial visit note for detailed documentation.   Patient Care Team: Steele Sizer, MD as PCP - General (Family Medicine) Wellington Hampshire, MD as PCP - Cardiology (Cardiology) Wellington Hampshire, MD as Consulting Physician (Cardiology) Germaine Pomfret, [facility] as Pharmacist (Pharmacist)  Recent office visits: 03/16/22: Patient presented to Dr. Ancil Boozer for follow-up. A1c 7.4%. Trulicity 3 mg weekly  3/57/01: Patient presented to Dr. Rosana Berger. Wilder Glade stopped.   Recent consult visits: 05/06/22: Patient presented to Dr. Fletcher Anon (Cardiology) for follow-up.  02/26/22: Patient presented to [facility] (cardiology). BP 170/78.  10/28/21: Patient presented to Sherlynn Stalls, ANP (Nephrology).   Hospital visits: None in previous 6  months  Objective:  Lab Results  Component Value Date   CREATININE 1.27 (H) 02/25/2022   BUN 31 (H) 02/25/2022   GFRNONAA 41 (L) 05/12/2020   GFRAA 48 (L) 05/12/2020   NA 140 02/25/2022   K 4.3 02/25/2022   CALCIUM 8.9 02/25/2022   CO2 26 02/25/2022    Lab Results  Component Value Date/Time   HGBA1C 7.4 (A) 03/16/2022 08:06 AM   HGBA1C 7.6 (H) 11/11/2021 09:37 AM   HGBA1C 7.0 (A) 07/14/2021 08:18 AM   HGBA1C 6.8 (H) 05/14/2019 12:00 AM   HGBA1C 6.5 01/01/2019 07:48 AM   HGBA1C 6.6 08/30/2018 07:46 AM   HGBA1C 6.6 (A) 08/30/2018 07:46 AM   HGBA1C 6.6 08/30/2018 07:46 AM   MICROALBUR 31.9 07/14/2021 09:01 AM   MICROALBUR 5.4 05/14/2019 12:00 AM   MICROALBUR 20 08/15/2015 08:12 AM    Last diabetic Eye exam:  Lab Results  Component Value Date/Time   HMDIABEYEEXA No Retinopathy 08/19/2021 12:00 AM    Last diabetic Foot exam: No results found for: "HMDIABFOOTEX"   Lab Results  Component Value Date   CHOL 134 07/14/2021   HDL 51 07/14/2021   LDLCALC 67 07/14/2021   TRIG 78 07/14/2021   CHOLHDL 2.6 07/14/2021       Latest Ref Rng & Units 11/11/2021    9:37 AM 07/14/2021    9:01 AM 05/12/2020    8:59 AM  Hepatic Function  Total Protein 6.1 - 8.1 g/dL 6.7  6.6  6.8   AST 10 - 35 U/L _0 ALT 6 - 29 U/L _1 Total Bilirubin 0.2 - 1.2 mg/dL 0.4  0.5  0.5     No results found for: "TSH", "FREET4"     Latest Ref Rng & Units 07/14/2021    9:01 AM 11/19/2020    8:26 AM 05/12/2020    8:59 AM  CBC  WBC 3.8 - 10.8 Thousand/uL 8.9  7.6  8.6   Hemoglobin 11.7 - 15.5 g/dL 10.8  10.2  10.3   Hematocrit 35.0 - 45.0 % 33.4  31.9  31.7   Platelets 140 - 400 Thousand/uL 228  225  229     Lab Results  Component Value Date/Time   VD25OH 45 05/12/2020 08:59 AM   VD25OH 48 05/14/2019 12:00 AM    Clinical ASCVD: Yes  The 10-year ASCVD risk score (Arnett DK, et al., 2019) is: 22.4%   Values used to calculate the score:     Age: 6 years     Sex: Female      Is Non-Hispanic African American: Yes     Diabetic: Yes     Tobacco smoker: No     Systolic Blood Pressure: 102 mmHg     Is BP treated: Yes     HDL Cholesterol: 51 mg/dL     Total Cholesterol: 134 mg/dL       06/24/2022    8:01 AM 03/16/2022    8:05 AM 02/25/2022    1:49 PM  Depression screen PHQ 2/9  Decreased Interest 0 0 0  Down, Depressed, Hopeless 0 0 0  PHQ - 2 Score 0 0 0  Altered sleeping  0 0  Tired, decreased energy  0 0  Change in appetite  0 0  Feeling bad or failure about yourself   0 0  Trouble concentrating  0 0  Moving slowly or fidgety/restless  0 0  Suicidal thoughts  0 0  PHQ-9 Score  0 0  Difficult doing work/chores   Not difficult at all    Social History   Tobacco Use  Smoking Status Never  Smokeless Tobacco Never  Tobacco Comments   n/a   BP Readings from Last 3 Encounters:  05/06/22 128/70  03/16/22 132/74  02/26/22 (!) 170/78   Pulse Readings from Last 3 Encounters:  05/06/22 70  03/16/22 92  02/26/22 77   Wt Readings from Last 3 Encounters:  05/06/22 (!) 354 lb 4 oz (160.7 kg)  03/16/22 (!) 347 lb (157.4 kg)  02/26/22 (!) 350 lb 6 oz (158.9 kg)    Assessment/Interventions: Review of patient past medical history, allergies, medications, health status, including review of consultants reports, laboratory and other test data, was performed as part of comprehensive evaluation and provision of chronic care management services.   SDOH:  (Social Determinants of Health) assessments and interventions performed: Yes SDOH Interventions    Flowsheet Row Clinical Support from 06/24/2022 in [place] [facility] from 01/06/2022 in [place] from 06/23/2021 in [place] [facility] from 02/11/2021 in [place] [facility] from 12/17/2020 in [place] [facility] from 10/29/2020 in [facility]  SDOH Interventions        Food Insecurity Interventions Intervention Not Indicated -- Intervention Not Indicated -- -- --  Housing Interventions Intervention Not Indicated -- Intervention Not Indicated -- -- --  Transportation Interventions Intervention Not Indicated -- Intervention Not Indicated -- -- --  Utilities Interventions Intervention Not Indicated -- -- -- -- --  Alcohol Usage Interventions Intervention Not  Indicated (Score <7) -- -- -- -- --  Financial Strain Interventions Intervention Not Indicated Other (Comment)  [PAP] Intervention Not Indicated  [pt receives patient assistance for medications] Other (Comment)  [PAP] Other (Comment)  [PAP] Other (Comment)  [PAP]  Physical Activity Interventions Intervention Not Indicated -- Intervention Not Indicated -- -- --  Stress Interventions Intervention Not Indicated -- Intervention Not Indicated -- -- --  Social Connections Interventions Intervention Not Indicated -- Intervention Not Indicated -- -- --          CCM Care Plan  Allergies  Allergen Reactions   Farxiga [Dapagliflozin] Hives    Medications Reviewed Today     Reviewed by Britt Bottom, CMA (Certified Medical Assistant) on 05/06/22 at [place] List Status: <None>   Medication Order Taking? Sig Documenting Provider Last Dose Status Informant  acetaminophen (TYLENOL) 650 MG CR tablet [id number]  Take 650 mg by mouth in the morning and at bedtime. [provider]  Active   albuterol (PROVENTIL HFA;VENTOLIN HFA) 108 (90 BASE) MCG/ACT inhaler [id number]  Inhale 2 puffs into the lungs every 6 (six) hours as needed for wheezing or shortness of breath. [provider]  Active Self  Alcohol Swabs (DROPSAFE ALCOHOL PREP) 70 % PADS [id number]  USE AS DIRECTED Steele Sizer, MD  Active   allopurinol (ZYLOPRIM) 100 MG tablet [id number]  Take 1 tablet (100 mg total) by mouth 2 (two) times daily. Steele Sizer, MD  Active   apixaban  (ELIQUIS) 5 MG TABS tablet [id number]  Take 1 tablet (5 mg total) by mouth 2 (two) times daily. Wellington Hampshire, MD  Active   Blood Glucose Calibration (TRUE METRIX LEVEL 1) Low SOLN [id number]  1 each by In Vitro route once a week. Steele Sizer, MD  Active   Blood Glucose Monitoring Suppl (TRUE METRIX AIR GLUCOSE METER) w/Device KIT [id number]  Inject 1 each into the skin 4 (four) times daily as needed. Check fsbs 4 times daily E11.22 Steele Sizer, MD  Active   Cholecalciferol (VITAMIN D) 2000 UNITS tablet [id number]  Take 2,000 Units by mouth daily. [provider]  Active Self  colchicine (COLCRYS) 0.6 MG tablet [id number]  Take 1-2 tablets (0.6-1.2 mg total) by mouth daily as needed. Steele Sizer, MD  Active   diltiazem [facility] CD) 240 MG 24 hr capsule [id number]  TAKE 1 CAPSULE EVERY DAY Wellington Hampshire, MD  Active   DROPLET PEN NEEDLES 30G X 8 MM MISC [id number]  USE TWICE DAILY WITH INSULIN Ancil Boozer, Drue Stager, MD  Active   Dulaglutide (TRULICITY) 3 XB/2.6OM SOPN [id number]  Inject 3 mg as directed once a week. Steele Sizer, MD  Active   Elastic Bandages & Supports ([place]) [place] [id number]  2 each by Does not apply route daily. Steele Sizer, MD  Active Self  fluticasone [facility]) 50 MCG/ACT nasal spray [id number]  Place 2 sprays into both nostrils daily. Steele Sizer, MD  Active   fluticasone-salmeterol (ADVAIR) 250-50 MCG/ACT AEPB [id number]  INHALE 1 PUFF BY MOUTH TWICE DAILY Ancil Boozer, Drue Stager, MD  Active   furosemide (LASIX) 40 MG tablet [id number]  TAKE 1 TABLET (40 MG TOTAL) BY MOUTH 2 (TWO) TIMES DAILY AS NEEDED. Steele Sizer, MD  Active   Insulin Lispro Prot & Lispro (HUMALOG MIX 75/25 KWIKPEN) (75-25) 100 UNIT/ML Claiborne Rigg [id number]  Inject 10 Units into the skin 2 (two) times daily with a meal. Steele Sizer, MD  Active   irbesartan (AVAPRO) 300 MG tablet [id number]  Take 1  tablet (300 mg total) by mouth daily. Steele Sizer, MD  Active    loratadine (CLARITIN) 10 MG tablet [id number]  Take 1 tablet (10 mg total) by mouth daily. Teodora Medici, DO  Active   potassium chloride SA (KLOR-CON) 20 MEQ tablet [id number]  Take 1 tablet (20 mEq total) by mouth daily. With furosemide Steele Sizer, MD  Active   pravastatin (PRAVACHOL) 80 MG tablet [id number]  Take 1 tablet (80 mg total) by mouth every evening. Steele Sizer, MD  Active   TRUE METRIX BLOOD GLUCOSE TEST test strip [id number]  CHECK BLOOD SUGAR FOUR TIMES DAILY Steele Sizer, MD  Active   TRUEplus Lancets 33G MISC [id number]  TEST BLOOD SUGAR THREE TIMES DAILY AS NEEDED Steele Sizer, MD  Active   TYLENOL 325 MG tablet [id number]   [provider]  Active             Patient Active Problem List   Diagnosis Date Noted   Localized, 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/22/2021   Pain in joint of left shoulder 04/22/2021   Tendonitis of shoulder, left 04/22/2021   Hypertension associated with stage 3 chronic kidney disease due to type 2 diabetes mellitus ([place]) 02/06/2020   History of colonic polyps    Polyp of colon    Thoracic aortic atherosclerosis ([place]) 12/30/2016   Controlled type 2 diabetes mellitus with stage 3 chronic kidney disease ([place]) 12/23/2016   Venous insufficiency 05/21/2016   Hypertensive heart disease 05/21/2016   Anemia in chronic kidney disease 12/22/2015   Pulmonary hypertension ([place]) 12/18/2015   Xanthelasma 04/15/2015   Anxiety 04/12/2015   Chronic kidney disease (CKD), stage III (moderate) (HCC) 04/12/2015   Edema extremities 04/12/2015   Disc disorder of lumbar region 04/12/2015   Asthma, moderate persistent 04/12/2015   Morbid obesity, unspecified obesity type ([place]) 04/12/2015   Obstructive apnea 04/12/2015   Restless leg 04/12/2015   Allergic rhinitis 04/12/2015   Vitamin D deficiency 04/12/2015   Controlled gout 04/12/2015   Premature atrial contractions 11/29/2013   Atrial fibrillation ([place]) 11/09/2013    Benign essential hypertension    Hyperlipidemia     Immunization History  Administered Date(s) Administered   Fluad Quad(high Dose 65+) 05/14/2019, 05/12/2020, 07/14/2021, 07/05/2022   Influenza Split 05/16/2007   Influenza, High Dose Seasonal PF 06/24/2017, 04/25/2018   Influenza, Seasonal, Injecte, Preservative Fre 05/07/2010, 04/27/2011   Influenza,inj,Quad PF,6+ Mos 08/02/2014, 04/15/2015, 05/22/2016   Influenza-Unspecified 08/02/2014   Moderna Sars-Covid-2 Vaccination 09/28/2019, 10/26/2019, 07/02/2020   Pneumococcal Conjugate-13 08/02/2014   Pneumococcal Polysaccharide-23 04/27/2011, 06/04/2016   Tdap 09/29/2012   Zoster, Live 09/29/2012    Conditions to be addressed/monitored:  Hypertension, Hyperlipidemia, Diabetes, Atrial Fibrillation, Asthma, Chronic Kidney Disease and Gout  Care Plan : General Pharmacy (Adult)  Updates made by Germaine Pomfret, RPH since 07/22/2022 12:00 AM     Problem: Hypertension, Hyperlipidemia, Diabetes, Atrial Fibrillation, Asthma, Chronic Kidney Disease and Gout   Priority: High     Long-Range Goal: Patient-Specific Goal   Start Date: 10/29/2020  Expected End Date: 04/01/2023  This Visit's Progress: On track  Recent Progress: On track  Priority: High  Note:   Current Barriers:  Unable to independently afford treatment regimen  Pharmacist Clinical Goal(s):  Over the next 90 days, patient will maintain control of diabetes as evidenced by A1c less than 8%  through collaboration with PharmD and provider.   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 Continue to monitor for now.  -Recommended to continue current medication  Atrial Fibrillation (Goal: prevent stroke and major bleeding) -Controlled:  -CHADSVASC: 5 -Current treatment: Rate control: Diltiazem CD 240 mg daily  Anticoagulation: Eliquis 5 mg twice daily  -Medications previously tried: NA -Home BP and HR readings: NA  -Counseled on avoidance of NSAIDs due to increased bleeding risk with anticoagulants; -Recommended to continue current medication  Hyperlipidemia: (LDL goal < 70) -Controlled -Current treatment: Pravastatin 80 mg daily  -Medications previously tried: NA  -Educated on Importance of limiting foods high in cholesterol; -Recommended to continue current medication  Diabetes (A1c goal <8%) -Not ideally controlled -Current medications: Trulicity 3 mg weekly: Appropriate, Effective, Safe, Accessible   Humalog 75-25 10 units twice daily before meals: Appropriate, Effective, Safe, Accessible  Gives extra 2 units if blood sugar greater than 150 -Medications previously tried: Metformin, Farxiga (Hives) -Current home glucose readings:   Fasting Comments  29-Nov 168 Late Supper  28-Nov 164 Late Supper  26-Nov 153 Late Supper  25-Nov 142   24-Nov 179 Thanksgiving Meal  23-Nov 125   22-Nov 120   21-Nov 131   20-Nov 136   19-Nov 146   18-Nov 136   17-Nov  139   16-Nov 127   15-Nov 142   Average 143    -Denies hypoglycemic symptoms -Patient blood sugars mostly at goal with some elevations related to Thanksgiving and eating later suppers. She continues to adjust her insulin by 2 units if greater than 150. She has received Trulicity 3 mg and is tolerating it well, although she did report some nausea initially.  Continue current medications  Asthma (Goal: control symptoms and prevent exacerbations) -Controlled -Current treatment  Proventil HFA 2 puffs every 6 hours as needed Advair 1 puff twice daily  -Medications previously tried: NA  -Exacerbations requiring treatment in last 6 months: None -Patient reports consistent use of maintenance inhaler -Frequency of rescue inhaler use: NA -Counseled on When to use rescue inhaler -Recommended to continue current medication  Chronic Kidney Disease Stage 3a  -All medications assessed for renal dosing and appropriateness in chronic kidney disease. -Recommended to continue current medication  Patient Goals/Self-Care Activities Over the next 90 days, patient will:  - check glucose 3 times daily, before breakfast, supper, and bedtime, document, and provide at future appointments check blood pressure 2-3 times weekly, document, and provide at future appointments  Follow Up Plan: Telephone follow up appointment with care management team member scheduled for:  01/18/2022 at 8:30 AM    Medication Assistance:  Trulicity, Humalog obtained through [employer] medication assistance program.  Enrollment ends Dec 2023  Patient's preferred pharmacy is:  [facility] [place], [place] [address] [place] [address] Phone: [phone] Fax: [place], [place] [address] [place] [address] Phone: [phone] Fax: [phone]  [place], [place] [address] Phone: [phone] Fax: [place], [place] [address] [place] [address] Phone: [phone] Fax: [phone]  Uses pill box? Yes Pt endorses 100% compliance  We discussed: Current pharmacy is preferred with insurance plan and patient is satisfied with pharmacy services Patient decided to: Continue current medication management strategy  Care Plan and Follow Up Patient Decision:  Patient agrees to Care Plan and Follow-up.  Plan: Telephone follow up appointment with care management team member scheduled for:  01/18/2022 at 8:30 AM  Malva Limes, [place] Pharmacist Practitioner  [facility] [phone]

## 2022-07-22 DIAGNOSIS — I129 Hypertensive chronic kidney disease with stage 1 through stage 4 chronic kidney disease, or unspecified chronic kidney disease: Secondary | ICD-10-CM

## 2022-07-22 DIAGNOSIS — E1122 Type 2 diabetes mellitus with diabetic chronic kidney disease: Secondary | ICD-10-CM

## 2022-07-22 DIAGNOSIS — N183 Chronic kidney disease, stage 3 unspecified: Secondary | ICD-10-CM

## 2022-07-22 DIAGNOSIS — Z794 Long term (current) use of insulin: Secondary | ICD-10-CM

## 2022-07-22 NOTE — Patient Instructions (Signed)
Visit Information It was great speaking with you today!  Please let me know if you have any questions about our visit.   Goals Addressed             This Visit's Progress    Monitor and Manage My Blood Sugar-Diabetes Type 2   On track    Timeframe:  Long-Range Goal Priority:  High Start Date:  10/29/2020                           Expected End Date:  05/01/2022                     Follow Up within 90 days   -check blood sugar three times daily: Before breakfast, before supper, and at bedtime - check blood sugar if I feel it is too high or too low - enter blood sugar readings and medication or insulin into daily log    Why is this important?   Checking your blood sugar at home helps to keep it from getting very high or very low.  Writing the results in a diary or log helps the doctor know how to care for you.  Your blood sugar log should have the time, date and the results.  Also, write down the amount of insulin or other medicine that you take.  Other information, like what you ate, exercise done and how you were feeling, will also be helpful.     Notes:      Track and Manage My Blood Pressure-Hypertension   On track    Timeframe:  Long-Range Goal Priority:  High Start Date:  12/17/2020                            Expected End Date:  06/18/2022                    Follow Up within 90 days   - check blood pressure 3 times per week    Why is this important?   You won't feel high blood pressure, but it can still hurt your blood vessels.  High blood pressure can cause heart or kidney problems. It can also cause a stroke.  Making lifestyle changes like losing a little weight or eating less salt will help.  Checking your blood pressure at home and at different times of the day can help to control blood pressure.  If the doctor prescribes medicine remember to take it the way the doctor ordered.  Call the office if you cannot afford the medicine or if there are questions about it.      Notes:         Patient Care Plan: General Pharmacy (Adult)     Problem Identified: Hypertension, Hyperlipidemia, Diabetes, Atrial Fibrillation, Asthma, Chronic Kidney Disease and Gout   Priority: High     Long-Range Goal: Patient-Specific Goal   Start Date: 10/29/2020  Expected End Date: 04/01/2023  This Visit's Progress: On track  Recent Progress: On track  Priority: High  Note:   Current Barriers:  Unable to independently afford treatment regimen  Pharmacist Clinical Goal(s):  Over the next 90 days, patient will maintain control of diabetes as evidenced by A1c less than 8%  through collaboration with PharmD and provider.   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 Continue to monitor for now.  -Recommended to continue current medication  Atrial Fibrillation (Goal: prevent stroke and major bleeding) -Controlled:  -CHADSVASC: 5 -Current treatment: Rate control: Diltiazem CD 240 mg  daily  Anticoagulation: Eliquis 5 mg twice daily  -Medications previously tried: NA -Home BP and HR readings: NA  -Counseled on avoidance of NSAIDs due to increased bleeding risk with anticoagulants; -Recommended to continue current medication  Hyperlipidemia: (LDL goal < 70) -Controlled -Current treatment: Pravastatin 80 mg daily  -Medications previously tried: NA  -Educated on Importance of limiting foods high in cholesterol; -Recommended to continue current medication  Diabetes (A1c goal <8%) -Not ideally controlled -Current medications: Trulicity 3 mg weekly: Appropriate, Effective, Safe, Accessible   Humalog 75-25 10 units twice daily before meals: Appropriate, Effective, Safe, Accessible  Gives extra 2 units if blood sugar greater than 150 -Medications previously tried: Metformin, Farxiga (Hives) -Current home glucose readings:   Fasting Comments  29-Nov 168 Late Supper  28-Nov 164 Late Supper  26-Nov 153 Late Supper  25-Nov 142   24-Nov 179 Thanksgiving Meal  23-Nov 125   22-Nov 120   21-Nov 131   20-Nov 136   19-Nov 146   18-Nov 136   17-Nov 139   16-Nov 127   15-Nov 142   Average 143    -Denies hypoglycemic symptoms -Patient blood sugars mostly at goal with some elevations related to Thanksgiving and eating later suppers. She continues to adjust her insulin by 2 units if greater than 150. She has received Trulicity 3 mg and is tolerating it well, although she did report some nausea initially.  Continue current medications  Asthma (Goal: control symptoms and prevent exacerbations) -Controlled -Current treatment  Proventil HFA 2 puffs every 6 hours as needed Advair 1 puff twice daily  -Medications previously tried: NA  -Exacerbations requiring treatment in last 6 months: None -Patient reports consistent use of maintenance inhaler -Frequency of rescue inhaler use: NA -Counseled on When to use rescue inhaler -Recommended to continue current  medication  Chronic Kidney Disease Stage 3a  -All medications assessed for renal dosing and appropriateness in chronic kidney disease. -Recommended to continue current medication  Patient Goals/Self-Care Activities Over the next 90 days, patient will:  - check glucose 3 times daily, before breakfast, supper, and bedtime, document, and provide at future appointments check blood pressure 2-3 times weekly, document, and provide at future appointments  Follow Up Plan: Telephone follow up appointment with care management team member scheduled for:  01/18/2022 at 8:30 AM    Patient agreed to services and verbal consent obtained.   Patient verbalizes understanding of instructions and care plan provided today and agrees to view in [place]. Active MyChart status and patient understanding of how to access  instructions and care plan via MyChart confirmed with patient.     Malva Limes, [place] Pharmacist Practitioner  [facility] [phone]

## 2022-07-26 ENCOUNTER — Telehealth: Payer: Self-pay | Admitting: Cardiovascular Disease

## 2022-07-26 NOTE — Telephone Encounter (Signed)
Patient dropped off PAF placed in box 

## 2022-07-27 NOTE — Telephone Encounter (Signed)
Assistance has been faxed.

## 2022-08-02 ENCOUNTER — Telehealth: Payer: Self-pay

## 2022-08-02 NOTE — Progress Notes (Signed)
Chronic Care Management Pharmacy Assistant   Name: Amber Reyes  MRN: [id number] DOB: Mar 15, 1946  Reason for Encounter: Patient Assistance Application Renewal Status  I contacted [facility] regarding the updated status of the patient's renewal application that was faxed in on 25/12/3974 for her Trulicity and Humalog.   I spoke with the representative who advised that they did receive her application, but only portion they have in the system was for her Humalog which has been approved until 08/23/2023. I did request that the representative take a look at the patient's file to see if the Trulicity renewal application was over looked.  According to the representative all she has in they system is for the patient's Humalog.  I informed [facility] that Trulicity application was never received and requested that he please refax this portion of the application again when he is in office on Wednesday. Task sent to [facility] regarding refaxing application.  Medications: Outpatient Encounter Medications as of 08/02/2022  Medication Sig   acetaminophen (TYLENOL) 650 MG CR tablet Take 650 mg by mouth in the morning and at bedtime.   albuterol (PROVENTIL HFA;VENTOLIN HFA) 108 (90 BASE) MCG/ACT inhaler Inhale 2 puffs into the lungs every 6 (six) hours as needed for wheezing or shortness of breath.   Alcohol Swabs (DROPSAFE ALCOHOL PREP) 70 % PADS USE AS DIRECTED   allopurinol (ZYLOPRIM) 100 MG tablet Take 1 tablet (100 mg total) by mouth 2 (two) times daily.   apixaban (ELIQUIS) 5 MG TABS tablet Take 1 tablet (5 mg total) by mouth 2 (two) times daily.   Blood Glucose Calibration (TRUE METRIX LEVEL 1) Low SOLN 1 each by In Vitro route once a week.   Blood Glucose Monitoring Suppl (TRUE METRIX AIR GLUCOSE METER) w/Device KIT Inject 1 each into the skin 4 (four) times daily as needed. Check fsbs 4 times daily E11.22   Cholecalciferol (VITAMIN D) 2000 UNITS tablet Take 2,000 Units by mouth daily.    colchicine (COLCRYS) 0.6 MG tablet Take 1-2 tablets (0.6-1.2 mg total) by mouth daily as needed.   diltiazem (CARDIZEM CD) 240 MG 24 hr capsule Take 1 capsule (240 mg total) by mouth daily.   DROPLET PEN NEEDLES 30G X 8 MM MISC USE TWICE DAILY WITH INSULIN   Dulaglutide (TRULICITY) 3 BH/4.1PF SOPN Inject 3 mg as directed once a week.   Elastic Bandages & Supports (MEDICAL COMPRESSION STOCKINGS) MISC 2 each by Does not apply route daily.   fluticasone (FLONASE) 50 MCG/ACT nasal spray Place 2 sprays into both nostrils daily.   fluticasone-salmeterol (ADVAIR) 250-50 MCG/ACT AEPB INHALE 1 PUFF BY MOUTH TWICE DAILY   furosemide (LASIX) 40 MG tablet TAKE 1 TABLET (40 MG TOTAL) BY MOUTH 2 (TWO) TIMES DAILY AS NEEDED.   Insulin Lispro Prot & Lispro (HUMALOG MIX 75/25 KWIKPEN) (75-25) 100 UNIT/ML Kwikpen Inject 10 Units into the skin 2 (two) times daily with a meal.   irbesartan (AVAPRO) 300 MG tablet Take 1 tablet (300 mg total) by mouth daily.   loratadine (CLARITIN) 10 MG tablet Take 1 tablet (10 mg total) by mouth daily.   potassium chloride SA (KLOR-CON) 20 MEQ tablet Take 1 tablet (20 mEq total) by mouth daily. With furosemide   pravastatin (PRAVACHOL) 80 MG tablet Take 1 tablet (80 mg total) by mouth every evening.   TRUE METRIX BLOOD GLUCOSE TEST test strip CHECK BLOOD SUGAR FOUR TIMES DAILY   TRUEplus Lancets 33G MISC TEST BLOOD SUGAR THREE TIMES DAILY AS NEEDED  TYLENOL 325 MG tablet    No facility-administered encounter medications on file as of 08/02/2022.    [name], CPA/CMA Clinical Pharmacist Assistant Phone: [phone]

## 2022-08-17 ENCOUNTER — Other Ambulatory Visit: Payer: Self-pay | Admitting: Family Medicine

## 2022-08-17 DIAGNOSIS — E1169 Type 2 diabetes mellitus with other specified complication: Secondary | ICD-10-CM

## 2022-08-18 ENCOUNTER — Telehealth: Payer: Self-pay

## 2022-08-18 NOTE — Progress Notes (Signed)
    Chronic Care Management Pharmacy Assistant   Name: Amber Reyes  MRN: [id number] DOB: [birth year]  Reason for Encounter: Patient Assistance Application [id number] Renewal (Trulicity/Humalog)  I contacted [employer] and they confirmed that the patient was approved for the 2024 year for her Trulicity and Humalog. The patient will continue to receive her medications starting 08/2022.     Medications: Outpatient Encounter Medications as of 08/18/2022  Medication Sig   acetaminophen (TYLENOL) 650 MG CR tablet Take 650 mg by mouth in the morning and at bedtime.   albuterol (PROVENTIL HFA;VENTOLIN HFA) 108 (90 BASE) MCG/ACT inhaler Inhale 2 puffs into the lungs every 6 (six) hours as needed for wheezing or shortness of breath.   Alcohol Swabs (DROPSAFE ALCOHOL PREP) 70 % PADS USE AS DIRECTED   allopurinol (ZYLOPRIM) 100 MG tablet Take 1 tablet (100 mg total) by mouth 2 (two) times daily.   apixaban (ELIQUIS) 5 MG TABS tablet Take 1 tablet (5 mg total) by mouth 2 (two) times daily.   Blood Glucose Calibration (TRUE METRIX LEVEL 1) Low SOLN 1 each by In Vitro route once a week.   Blood Glucose Monitoring Suppl (TRUE METRIX AIR GLUCOSE METER) w/Device KIT Inject 1 each into the skin 4 (four) times daily as needed. Check fsbs 4 times daily E11.22   Cholecalciferol (VITAMIN D) 2000 UNITS tablet Take 2,000 Units by mouth daily.   colchicine (COLCRYS) 0.6 MG tablet Take 1-2 tablets (0.6-1.2 mg total) by mouth daily as needed.   diltiazem (CARDIZEM CD) 240 MG 24 hr capsule Take 1 capsule (240 mg total) by mouth daily.   DROPLET PEN NEEDLES 30G X 8 MM MISC USE TWICE DAILY WITH INSULIN   Dulaglutide (TRULICITY) 3 NK/5.3ZJ SOPN Inject 3 mg as directed once a week.   Elastic Bandages & Supports (MEDICAL COMPRESSION STOCKINGS) MISC 2 each by Does not apply route daily.   fluticasone (FLONASE) 50 MCG/ACT nasal spray Place 2 sprays into both nostrils daily.   fluticasone-salmeterol (ADVAIR)  250-50 MCG/ACT AEPB INHALE 1 PUFF BY MOUTH TWICE DAILY   furosemide (LASIX) 40 MG tablet TAKE 1 TABLET (40 MG TOTAL) BY MOUTH 2 (TWO) TIMES DAILY AS NEEDED.   Insulin Lispro Prot & Lispro (HUMALOG MIX 75/25 KWIKPEN) (75-25) 100 UNIT/ML Kwikpen Inject 10 Units into the skin 2 (two) times daily with a meal.   irbesartan (AVAPRO) 300 MG tablet Take 1 tablet (300 mg total) by mouth daily.   loratadine (CLARITIN) 10 MG tablet Take 1 tablet (10 mg total) by mouth daily.   potassium chloride SA (KLOR-CON) 20 MEQ tablet Take 1 tablet (20 mEq total) by mouth daily. With furosemide   pravastatin (PRAVACHOL) 80 MG tablet TAKE 1 TABLET EVERY EVENING   TRUE METRIX BLOOD GLUCOSE TEST test strip CHECK BLOOD SUGAR FOUR TIMES DAILY   TRUEplus Lancets 33G MISC TEST BLOOD SUGAR THREE TIMES DAILY AS NEEDED   TYLENOL 325 MG tablet    No facility-administered encounter medications on file as of 08/18/2022.    [name], CPA/CMA Clinical Pharmacist Assistant Phone: [phone]

## 2022-08-22 ENCOUNTER — Other Ambulatory Visit: Payer: Self-pay | Admitting: Family Medicine

## 2022-08-22 DIAGNOSIS — E1122 Type 2 diabetes mellitus with diabetic chronic kidney disease: Secondary | ICD-10-CM

## 2022-08-24 NOTE — Progress Notes (Unsigned)
Name: Amber Reyes   MRN: [id number]    DOB: Jun 18, 1946   Date:08/25/2022       Progress Note  Subjective  Chief Complaint  Follow Up  HPI  DMII controlled: She has been compliant with medication, she is on  Humalog 75/25 ( current taking  10 units twice daily)  since  Feb 2018, also started on Trulicity in 1245 [id number] was 6.5%  6.7% ,6.9%, 6.8 % , 7 %, 7%  7.6 %, today is a little higher at 7.9 %  ( she is allergic to [place]) she has not been as compliant with her diet but will resume a diabetic diet to keep A1C around 7.5 %  Taking Avapro for microalbuminuria and CKI stage III ,  she is on statin for dyslipidemia, she has diabetes associated with obesity. FSB at home was around 127-135   HTN/CHF/Afib/Pulmonary hypertension/dilated aorta  : taking medications and denies side effects of medications, bp today is at goal in our office, at home bp has been around 135-140 SBP at home. She denies chest pain no recent palpitation, found to have Afib on EKG done by Dr. Fletcher Anon and takes Cardizem for rate control   She has orthopnea - uses two pillows , SOB is stable.  Last Echo 03/2022  LV function is still normal, but still has left and right atrial dilation, pulmonary hypertension and grade II diastolic dysfunction  She takes Eliquis and has been compliant with medication.  Hyperlipidemia: taking Pravastatin, we will continue current medication ,we will recheck labs today    CKIII: she is going to see nephrologist at [facility], last labs reviewed still stage III CKI , GFR stable, GFR has been stable between high 30 to 50's  denies  pruritis, she has good urine output, she is not taking potassium because she had hyperkalemia - but avoiding lasix, advised to take one potassium daily when she takes lasix  , she has secondary hyperparathyroidism - last level 107  back in 11/22 - recheck yearly    OSA: she was wearing CPAP machine every night, wakes up feeling rested.  Morbid Obesity: she gets Trulicity  through [employer] she initially lost a lot of weight but now is stable,  only gained 2 lbs since last visit    Asthma Moderate persistent/Chronic bronchitis: . She is compliant and uses Advair twice daily as prescribed.  No wheezing , morning cough but is dry, her SOB is back to baseline. She needs refill of albuterol   Atherosclerosis of aorta and echo showed mild dilation of ascending aorta, continue statin and eliquis and pravastatin   Chronic back pain: she cannot stand or walk for a long time because increases the pain, dull pain, no radiculitis. She states Tylenol controls symptoms . Unchanged   Patient Active Problem List   Diagnosis Date Noted   Localized, 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/22/2021   Pain in joint of left shoulder 04/22/2021   Tendonitis of shoulder, left 04/22/2021   Hypertension associated with stage 3 chronic kidney disease due to type 2 diabetes mellitus ([place]) 02/06/2020   History of colonic polyps    Polyp of colon    Thoracic aortic atherosclerosis ([place]) 12/30/2016   Controlled type 2 diabetes mellitus with stage 3 chronic kidney disease ([place]) 12/23/2016   Venous insufficiency 05/21/2016   Hypertensive heart disease 05/21/2016   Anemia in chronic kidney disease 12/22/2015   Pulmonary hypertension ([place]) 12/18/2015   Xanthelasma 04/15/2015   Anxiety 04/12/2015  Chronic kidney disease (CKD), stage III (moderate) (HCC) 04/12/2015   Edema extremities 04/12/2015   Disc disorder of lumbar region 04/12/2015   Asthma, moderate persistent 04/12/2015   Morbid obesity, unspecified obesity type ([employer]) 04/12/2015   Obstructive apnea 04/12/2015   Restless leg 04/12/2015   Allergic rhinitis 04/12/2015   Vitamin D deficiency 04/12/2015   Controlled gout 04/12/2015   Premature atrial contractions 11/29/2013   Atrial fibrillation ([place]) 11/09/2013   Benign essential hypertension    Hyperlipidemia     Past Surgical History:  Procedure Laterality  Date   CARDIAC CATHETERIZATION  2002   [facility]   CATARACT EXTRACTION W/PHACO Right 09/15/2021   Procedure: CATARACT EXTRACTION PHACO AND INTRAOCULAR LENS PLACEMENT (IOC) RIGHT 10.19 01:01.8;  Surgeon: Birder Robson, MD;  Location: [place];  Service: Ophthalmology;  Laterality: Right;   CATARACT EXTRACTION W/PHACO Left 09/29/2021   Procedure: CATARACT EXTRACTION PHACO AND INTRAOCULAR LENS PLACEMENT (IOC) LEFT DIABETIC 11.40 01:21.2;  Surgeon: Birder Robson, MD;  Location: [place];  Service: Ophthalmology;  Laterality: Left;   COLONOSCOPY WITH PROPOFOL N/A 09/25/2018   Procedure: COLONOSCOPY WITH PROPOFOL;  Surgeon: Jonathon Bellows, MD;  Location[facility] ENDOSCOPY;  Service: Gastroenterology;  Laterality: N/A;   COLONOSCOPY WITH PROPOFOL N/A 04/13/2019   Procedure: COLONOSCOPY WITH PROPOFOL;  Surgeon: Jonathon Bellows, MD;  Location: [facility] ENDOSCOPY;  Service: Gastroenterology;  Laterality: N/A;   COLONOSCOPY WITH PROPOFOL N/A 06/27/2020   Procedure: COLONOSCOPY WITH PROPOFOL;  Surgeon: Jonathon Bellows, MD;  Location: [facility] ENDOSCOPY;  Service: Gastroenterology;  Laterality: N/A;   PERICARDIUM SURGERY      Family History  Problem Relation Age of Onset   Heart attack Mother    Hypertension Mother    Breast cancer Neg Hx     Social History   Tobacco Use   Smoking status: Never   Smokeless tobacco: Never   Tobacco comments:    n/a  Substance Use Topics   Alcohol use: No    Alcohol/week: 0.0 standard drinks of alcohol     Current Outpatient Medications:    acetaminophen (TYLENOL) 650 MG CR tablet, Take 650 mg by mouth in the morning and at bedtime., Disp: , Rfl:    albuterol (PROVENTIL HFA;VENTOLIN HFA) 108 (90 BASE) MCG/ACT inhaler, Inhale 2 puffs into the lungs every 6 (six) hours as needed for wheezing or shortness of breath., Disp: , Rfl:    Alcohol Swabs (DROPSAFE ALCOHOL PREP) 70 % PADS, USE AS DIRECTED, Disp: 300 each, Rfl: 3   allopurinol (ZYLOPRIM) 100 MG tablet, Take  1 tablet (100 mg total) by mouth 2 (two) times daily., Disp: 180 tablet, Rfl: 1   apixaban (ELIQUIS) 5 MG TABS tablet, Take 1 tablet (5 mg total) by mouth 2 (two) times daily., Disp: 180 tablet, Rfl: 2   Blood Glucose Calibration (TRUE METRIX LEVEL 1) Low SOLN, 1 each by In Vitro route once a week., Disp: 1 each, Rfl: 1   Blood Glucose Monitoring Suppl (TRUE METRIX AIR GLUCOSE METER) w/Device KIT, Inject 1 each into the skin 4 (four) times daily as needed. Check fsbs 4 times daily E11.22, Disp: 1 kit, Rfl: 0   Cholecalciferol (VITAMIN D) 2000 UNITS tablet, Take 2,000 Units by mouth daily., Disp: , Rfl:    colchicine (COLCRYS) 0.6 MG tablet, Take 1-2 tablets (0.6-1.2 mg total) by mouth daily as needed., Disp: 30 tablet, Rfl: 0   diltiazem (CARDIZEM CD) 240 MG 24 hr capsule, Take 1 capsule (240 mg total) by mouth daily., Disp: 90 capsule,  Rfl: 3   DROPLET PEN NEEDLES 30G X 8 MM MISC, USE TWICE DAILY WITH INSULIN, Disp: 200 each, Rfl: 3   Dulaglutide (TRULICITY) 3 JW/9.2HV SOPN, Inject 3 mg as directed once a week., Disp: 6 mL, Rfl: 3   Elastic Bandages & Supports (MEDICAL COMPRESSION STOCKINGS) MISC, 2 each by Does not apply route daily., Disp: 2 each, Rfl: 2   fluticasone (FLONASE) 50 MCG/ACT nasal spray, Place 2 sprays into both nostrils daily., Disp: 48 g, Rfl: 2   fluticasone-salmeterol (ADVAIR) 250-50 MCG/ACT AEPB, INHALE 1 PUFF BY MOUTH TWICE DAILY, Disp: 180 each, Rfl: 1   furosemide (LASIX) 40 MG tablet, TAKE 1 TABLET (40 MG TOTAL) BY MOUTH 2 (TWO) TIMES DAILY AS NEEDED., Disp: 180 tablet, Rfl: 1   Insulin Lispro Prot & Lispro (HUMALOG MIX 75/25 KWIKPEN) (75-25) 100 UNIT/ML Kwikpen, Inject 10 Units into the skin 2 (two) times daily with a meal., Disp: , Rfl:    irbesartan (AVAPRO) 300 MG tablet, Take 1 tablet (300 mg total) by mouth daily., Disp: 90 tablet, Rfl: 1   loratadine (CLARITIN) 10 MG tablet, Take 1 tablet (10 mg total) by mouth daily., Disp: 30 tablet, Rfl: 11   potassium chloride SA  (KLOR-CON) 20 MEQ tablet, Take 1 tablet (20 mEq total) by mouth daily. With furosemide, Disp: 90 tablet, Rfl: 1   pravastatin (PRAVACHOL) 80 MG tablet, TAKE 1 TABLET EVERY EVENING, Disp: 90 tablet, Rfl: 0   TRUE METRIX BLOOD GLUCOSE TEST test strip, CHECK BLOOD SUGAR FOUR TIMES DAILY, Disp: 400 strip, Rfl: 0   TRUEplus Lancets 33G MISC, TEST BLOOD SUGAR THREE TIMES DAILY AS NEEDED, Disp: 100 each, Rfl: 3   TYLENOL 325 MG tablet, , Disp: , Rfl:   Allergies  Allergen Reactions   Farxiga [Dapagliflozin] Hives    I personally reviewed active problem list, medication list, allergies, family history, social history, health maintenance with the patient/caregiver today.   ROS  Constitutional: Negative for fever or weight change.  Respiratory: Negative for cough and shortness of breath.   Cardiovascular: Negative for chest pain or palpitations.  Gastrointestinal: Negative for abdominal pain, no bowel changes.  Musculoskeletal: Negative for gait problem or joint swelling.  Skin: Negative for rash.  Neurological: Negative for dizziness or headache.  No other specific complaints in a complete review of systems (except as listed in HPI above).   Objective  Vitals:   08/25/22 0917  BP: 128/72  Pulse: 71  Resp: 16  SpO2: 97%  Weight: (!) 358 lb (162.4 kg)  Height: _0  (1.778 m)    Body mass index is 51.37 kg/m.  Physical Exam  Constitutional: Patient appears well-developed and well-nourished. Obese  No distress.  HEENT: head atraumatic, normocephalic, pupils equal and reactive to light,, neck supple Cardiovascular: Normal rate, regular rhythm and normal heart sounds.  No murmur heard. Trace  BLE edema. Pulmonary/Chest: Effort normal and breath sounds normal. No respiratory distress. Abdominal: Soft.  There is no tenderness. Psychiatric: Patient has a normal mood and affect. behavior is normal. Judgment and thought content normal.   Recent Results (from the past 2160 hour(s))   POCT HgB A1C     Status: Abnormal   Collection Time: 08/25/22  9:18 AM  Result Value Ref Range   Hemoglobin A1C 7.9 (A) 4.0 - 5.6 %   HbA1c POC (<> result, manual entry)     HbA1c, POC (prediabetic range)     HbA1c, POC (controlled diabetic range)       PHQ2/9:  08/25/2022    9:17 AM 06/24/2022    8:01 AM 03/16/2022    8:05 AM 02/25/2022    1:49 PM 02/19/2022    3:51 PM  Depression screen PHQ 2/9  Decreased Interest 0 0 0 0 0  Down, Depressed, Hopeless 0 0 0 0 0  PHQ - 2 Score 0 0 0 0 0  Altered sleeping 0  0 0 0  Tired, decreased energy 0  0 0 0  Change in appetite 0  0 0 0  Feeling bad or failure about yourself  0  0 0 0  Trouble concentrating 0  0 0 0  Moving slowly or fidgety/restless 0  0 0 0  Suicidal thoughts 0  0 0 0  PHQ-9 Score 0  0 0 0  Difficult doing work/chores    Not difficult at all Not difficult at all    phq 9 is negative   Fall Risk:    08/25/2022    9:17 AM 06/24/2022    8:04 AM 03/16/2022    8:05 AM 02/25/2022    1:49 PM 02/19/2022    3:47 PM  Fall Risk   Falls in the past year? 0 0 0 0 0  Number falls in past yr: 0  0 0 0  Injury with Fall? 0  0 0 0  Risk for fall due to : No Fall Risks No Fall Risks No Fall Risks    Follow up Falls prevention discussed Education provided;Falls prevention discussed Falls prevention discussed        Functional Status Survey: Is the patient deaf or have difficulty hearing?: No Does the patient have difficulty seeing, even when wearing glasses/contacts?: No Does the patient have difficulty concentrating, remembering, or making decisions?: No Does the patient have difficulty walking or climbing stairs?: Yes Does the patient have difficulty dressing or bathing?: No Does the patient have difficulty doing errands alone such as visiting a doctor's office or shopping?: No    Assessment & Plan  1. Controlled type 2 diabetes mellitus with stage 3 chronic kidney disease, with long-term current use of insulin (HCC)  -  POCT HgB A1C - Urine Microalbumin w/creat. ratio  2. Chronic atrial fibrillation (HCC)  Rate controlled, on eliquis and cardizem   3. Dilation of aorta (HCC)  - Lipid panel  4. Morbid obesity ([place])  Discussed with the patient the risk posed by an increased BMI. Discussed importance of portion control, calorie counting and at least 150 minutes of physical activity weekly. Avoid sweet beverages and drink more water. Eat at least 6 servings of fruit and vegetables daily    5. Controlled type 2 diabetes mellitus with microalbuminuria, with long-term current use of insulin ([place])  Needs to resume a diabetic diet   6. Dyslipidemia associated with type 2 diabetes mellitus (HCC)  - Lipid panel - pravastatin (PRAVACHOL) 80 MG tablet; Take 1 tablet (80 mg total) by mouth every evening.  Dispense: 90 tablet; Refill: 1  7. Stage 3a chronic kidney disease (HCC)  - COMPLETE METABOLIC PANEL WITH GFR - CBC with Differential/Platelet - VITAMIN D 25 Hydroxy (Vit-D Deficiency, Fractures)  8. Secondary hyperparathyroidism of renal origin ([place])  - Parathyroid hormone, intact (no Ca)  9. Pulmonary hypertension ([place])  Under the care of cardiologist   10. Thoracic aortic atherosclerosis ([place])  On statin therapy   11. Controlled gout  - allopurinol (ZYLOPRIM) 100 MG tablet; Take 1 tablet (100 mg total) by mouth 2 (two) times daily.  Dispense:  180 tablet; Refill: 1  12. Hypertensive heart disease without heart failure   13. Benign essential hypertension  - irbesartan (AVAPRO) 300 MG tablet; Take 1 tablet (300 mg total) by mouth daily.  Dispense: 90 tablet; Refill: 1  14. Hypertensive heart disease with heart failure (HCC)  - furosemide (LASIX) 40 MG tablet; Take 1 tablet (40 mg total) by mouth daily.  Dispense: 90 tablet; Refill: 1  15. Moderate persistent asthma without complication  - fluticasone-salmeterol (ADVAIR) 250-50 MCG/ACT AEPB; INHALE 1 PUFF BY MOUTH TWICE DAILY  Dispense:  180 each; Refill: 1

## 2022-08-25 ENCOUNTER — Ambulatory Visit (INDEPENDENT_AMBULATORY_CARE_PROVIDER_SITE_OTHER): Payer: Medicare PPO | Admitting: Family Medicine

## 2022-08-25 ENCOUNTER — Encounter: Payer: Self-pay | Admitting: Family Medicine

## 2022-08-25 VITALS — BP 128/72 | HR 71 | Resp 16 | Ht 70.0 in | Wt 358.0 lb

## 2022-08-25 DIAGNOSIS — N2581 Secondary hyperparathyroidism of renal origin: Secondary | ICD-10-CM | POA: Diagnosis not present

## 2022-08-25 DIAGNOSIS — E1129 Type 2 diabetes mellitus with other diabetic kidney complication: Secondary | ICD-10-CM | POA: Diagnosis not present

## 2022-08-25 DIAGNOSIS — N183 Chronic kidney disease, stage 3 unspecified: Secondary | ICD-10-CM

## 2022-08-25 DIAGNOSIS — E1169 Type 2 diabetes mellitus with other specified complication: Secondary | ICD-10-CM

## 2022-08-25 DIAGNOSIS — N1831 Chronic kidney disease, stage 3a: Secondary | ICD-10-CM | POA: Diagnosis not present

## 2022-08-25 DIAGNOSIS — I482 Chronic atrial fibrillation, unspecified: Secondary | ICD-10-CM

## 2022-08-25 DIAGNOSIS — J4540 Moderate persistent asthma, uncomplicated: Secondary | ICD-10-CM

## 2022-08-25 DIAGNOSIS — E785 Hyperlipidemia, unspecified: Secondary | ICD-10-CM

## 2022-08-25 DIAGNOSIS — I119 Hypertensive heart disease without heart failure: Secondary | ICD-10-CM

## 2022-08-25 DIAGNOSIS — I700 Atherosclerosis of aorta: Secondary | ICD-10-CM

## 2022-08-25 DIAGNOSIS — I10 Essential (primary) hypertension: Secondary | ICD-10-CM

## 2022-08-25 DIAGNOSIS — E1122 Type 2 diabetes mellitus with diabetic chronic kidney disease: Secondary | ICD-10-CM

## 2022-08-25 DIAGNOSIS — I272 Pulmonary hypertension, unspecified: Secondary | ICD-10-CM

## 2022-08-25 DIAGNOSIS — R809 Proteinuria, unspecified: Secondary | ICD-10-CM

## 2022-08-25 DIAGNOSIS — M109 Gout, unspecified: Secondary | ICD-10-CM

## 2022-08-25 DIAGNOSIS — I77819 Aortic ectasia, unspecified site: Secondary | ICD-10-CM | POA: Diagnosis not present

## 2022-08-25 DIAGNOSIS — Z794 Long term (current) use of insulin: Secondary | ICD-10-CM | POA: Diagnosis not present

## 2022-08-25 DIAGNOSIS — I110 Hypertensive heart disease with heart failure: Secondary | ICD-10-CM

## 2022-08-25 LAB — POCT GLYCOSYLATED HEMOGLOBIN (HGB A1C): Hemoglobin A1C: 7.9 % — AB (ref 4.0–5.6)

## 2022-08-25 MED ORDER — FLUTICASONE PROPIONATE 50 MCG/ACT NA SUSP: 2.0000 | Freq: Every day | NASAL | 1 refills | Status: DC

## 2022-08-25 MED ORDER — FLUTICASONE-SALMETEROL 250-50 MCG/ACT IN AEPB: INHALATION_SPRAY | RESPIRATORY_TRACT | 1 refills | Status: DC

## 2022-08-25 MED ORDER — IRBESARTAN 300 MG PO TABS: 300.0000 mg | ORAL_TABLET | Freq: Every day | ORAL | 1 refills | Status: DC

## 2022-08-25 MED ORDER — POTASSIUM CHLORIDE CRYS ER 20 MEQ PO TBCR: 20.0000 meq | EXTENDED_RELEASE_TABLET | Freq: Every day | ORAL | 1 refills | Status: DC

## 2022-08-25 MED ORDER — FUROSEMIDE 40 MG PO TABS: 40.0000 mg | ORAL_TABLET | Freq: Every day | ORAL | 1 refills | Status: DC

## 2022-08-25 MED ORDER — PRAVASTATIN SODIUM 80 MG PO TABS: 80.0000 mg | ORAL_TABLET | Freq: Every evening | ORAL | 1 refills | Status: DC

## 2022-08-25 MED ORDER — ALBUTEROL SULFATE HFA 108 (90 BASE) MCG/ACT IN AERS: 2.0000 | INHALATION_SPRAY | Freq: Four times a day (QID) | RESPIRATORY_TRACT | 0 refills | Status: DC | PRN

## 2022-08-25 MED ORDER — ALLOPURINOL 100 MG PO TABS: 100.0000 mg | ORAL_TABLET | Freq: Two times a day (BID) | ORAL | 1 refills | Status: DC

## 2022-08-25 NOTE — Patient Instructions (Signed)
You need RSV, Tdap and Shinglex vaccine from local pharmacy

## 2022-08-26 LAB — LIPID PANEL
Cholesterol: 137 mg/dL (ref <200)
HDL: 48 mg/dL — ABNORMAL LOW (ref >=50)
LDL Cholesterol (Calc): 70 mg/dL (calc)
Non-HDL Cholesterol (Calc): 89 mg/dL (calc) (ref <130)
Total CHOL/HDL Ratio: 2.9 (calc) (ref <5.0)
Triglycerides: 102 mg/dL (ref <150)

## 2022-08-26 LAB — CBC WITH DIFFERENTIAL/PLATELET
Absolute Monocytes: 582 cells/uL (ref 200–950)
Basophils Absolute: 57 cells/uL (ref 0–200)
Basophils Relative: 0.7 %
Eosinophils Absolute: 262 cells/uL (ref 15–500)
Eosinophils Relative: 3.2 %
HCT: 32.9 % — ABNORMAL LOW (ref 35.0–45.0)
Hemoglobin: 10.7 g/dL — ABNORMAL LOW (ref 11.7–15.5)
Lymphs Abs: 1279 cells/uL (ref 850–3900)
MCH: 29.3 pg (ref 27.0–33.0)
MCHC: 32.5 g/dL (ref 32.0–36.0)
MCV: 90.1 fL (ref 80.0–100.0)
MPV: 11.5 fL (ref 7.5–12.5)
Monocytes Relative: 7.1 %
Neutro Abs: 6019 cells/uL (ref 1500–7800)
Neutrophils Relative %: 73.4 %
Platelets: 224 10*3/uL (ref 140–400)
RBC: 3.65 10*6/uL — ABNORMAL LOW (ref 3.80–5.10)
RDW: 13.9 % (ref 11.0–15.0)
Total Lymphocyte: 15.6 %
WBC: 8.2 10*3/uL (ref 3.8–10.8)

## 2022-08-26 LAB — COMPLETE METABOLIC PANEL WITH GFR
AG Ratio: 1.2 (calc) (ref 1.0–2.5)
ALT: 7 U/L (ref 6–29)
AST: 14 U/L (ref 10–35)
Albumin: 3.8 g/dL (ref 3.6–5.1)
Alkaline phosphatase (APISO): 88 U/L (ref 37–153)
BUN/Creatinine Ratio: 23 (calc) — ABNORMAL HIGH (ref 6–22)
BUN: 33 mg/dL — ABNORMAL HIGH (ref 7–25)
CO2: 27 mmol/L (ref 20–32)
Calcium: 8.7 mg/dL (ref 8.6–10.4)
Chloride: 103 mmol/L (ref 98–110)
Creat: 1.46 mg/dL — ABNORMAL HIGH (ref 0.60–1.00)
Globulin: 3.3 g/dL (calc) (ref 1.9–3.7)
Glucose, Bld: 149 mg/dL — ABNORMAL HIGH (ref 65–99)
Potassium: 4.6 mmol/L (ref 3.5–5.3)
Sodium: 138 mmol/L (ref 135–146)
Total Bilirubin: 0.5 mg/dL (ref 0.2–1.2)
Total Protein: 7.1 g/dL (ref 6.1–8.1)
eGFR: 38 mL/min/{1.73_m2} — ABNORMAL LOW (ref >=60)

## 2022-08-26 LAB — MICROALBUMIN / CREATININE URINE RATIO
Creatinine, Urine: 68 mg/dL (ref 20–275)
Microalb Creat Ratio: 412 mcg/mg creat — ABNORMAL HIGH (ref <30)
Microalb, Ur: 28 mg/dL

## 2022-08-26 LAB — PARATHYROID HORMONE, INTACT (NO CA): PTH: 87 pg/mL — ABNORMAL HIGH (ref 16–77)

## 2022-08-26 LAB — VITAMIN D 25 HYDROXY (VIT D DEFICIENCY, FRACTURES): Vit D, 25-Hydroxy: 41 ng/mL (ref 30–100)

## 2022-08-31 ENCOUNTER — Encounter: Payer: Self-pay | Admitting: Family Medicine

## 2022-09-06 ENCOUNTER — Other Ambulatory Visit: Payer: Self-pay | Admitting: Cardiovascular Disease

## 2022-09-06 DIAGNOSIS — I10 Essential (primary) hypertension: Secondary | ICD-10-CM

## 2022-09-15 ENCOUNTER — Other Ambulatory Visit: Payer: Self-pay | Admitting: Family Medicine

## 2022-09-22 ENCOUNTER — Other Ambulatory Visit: Payer: Self-pay | Admitting: Family Medicine

## 2022-09-30 ENCOUNTER — Telehealth: Payer: Self-pay

## 2022-09-30 NOTE — Progress Notes (Signed)
Care Management & Coordination Services Pharmacy Team Pharmacy Assistant   Name: Amber Reyes  MRN: [id number] DOB: 05-28-1946  Reason for Encounter: Hypertension  Contacted patient to discuss hypertension disease state. Spoke with patient on 09/30/2022    Chart Updates: Recent office visits:  08/25/2022 Steele Sizer, MD (PCP Office Visit) for Follow-up- Changed: Furosemide 40 mg twice daily prn to 40 mg daily, Lab orders placed,   Recent consult visits:  None ID  Hospital visits:  None in previous 6 months  Medications: Outpatient Encounter Medications as of 09/30/2022  Medication Sig   acetaminophen (TYLENOL) 650 MG CR tablet Take 650 mg by mouth in the morning and at bedtime.   albuterol (VENTOLIN HFA) 108 (90 Base) MCG/ACT inhaler Inhale 2 puffs into the lungs every 6 (six) hours as needed for wheezing or shortness of breath.   Alcohol Swabs (DROPSAFE ALCOHOL PREP) 70 % PADS USE AS DIRECTED   allopurinol (ZYLOPRIM) 100 MG tablet Take 1 tablet (100 mg total) by mouth 2 (two) times daily.   apixaban (ELIQUIS) 5 MG TABS tablet Take 1 tablet (5 mg total) by mouth 2 (two) times daily.   Blood Glucose Calibration (TRUE METRIX LEVEL 1) Low SOLN 1 each by In Vitro route once a week.   Blood Glucose Monitoring Suppl (TRUE METRIX AIR GLUCOSE METER) w/Device KIT Inject 1 each into the skin 4 (four) times daily as needed. Check fsbs 4 times daily E11.22   Cholecalciferol (VITAMIN D) 2000 UNITS tablet Take 2,000 Units by mouth daily.   colchicine (COLCRYS) 0.6 MG tablet Take 1-2 tablets (0.6-1.2 mg total) by mouth daily as needed.   diltiazem (CARDIZEM CD) 240 MG 24 hr capsule TAKE 1 CAPSULE EVERY DAY   DROPLET PEN NEEDLES 30G X 8 MM MISC USE TWICE DAILY WITH INSULIN   Dulaglutide (TRULICITY) 3 0000000 SOPN Inject 3 mg as directed once a week.   Elastic Bandages & Supports (MEDICAL COMPRESSION STOCKINGS) MISC 2 each by Does not apply route daily.   fluticasone (FLONASE) 50 MCG/ACT  nasal spray Place 2 sprays into both nostrils daily.   fluticasone-salmeterol (ADVAIR) 250-50 MCG/ACT AEPB INHALE 1 PUFF BY MOUTH TWICE DAILY   furosemide (LASIX) 40 MG tablet Take 1 tablet (40 mg total) by mouth daily.   Insulin Lispro Prot & Lispro (HUMALOG MIX 75/25 KWIKPEN) (75-25) 100 UNIT/ML Kwikpen Inject 10 Units into the skin 2 (two) times daily with a meal.   irbesartan (AVAPRO) 300 MG tablet Take 1 tablet (300 mg total) by mouth daily.   loratadine (CLARITIN) 10 MG tablet Take 1 tablet (10 mg total) by mouth daily.   potassium chloride SA (KLOR-CON M) 20 MEQ tablet Take 1 tablet (20 mEq total) by mouth daily. With furosemide   pravastatin (PRAVACHOL) 80 MG tablet Take 1 tablet (80 mg total) by mouth every evening.   TRUE METRIX BLOOD GLUCOSE TEST test strip CHECK BLOOD SUGAR FOUR TIMES DAILY   TRUEplus Lancets 33G MISC TEST BLOOD SUGAR THREE TIMES DAILY AS NEEDED   TYLENOL 325 MG tablet    No facility-administered encounter medications on file as of 09/30/2022.   Recent Office Vitals: BP Readings from Last 3 Encounters:  08/25/22 128/72  05/06/22 128/70  03/16/22 132/74   Pulse Readings from Last 3 Encounters:  08/25/22 71  05/06/22 70  03/16/22 92    Wt Readings from Last 3 Encounters:  08/25/22 (!) 358 lb (162.4 kg)  05/06/22 (!) 354 lb 4 oz (160.7 kg)  03/16/22 (!) 347  lb (157.4 kg)    Kidney Function Lab Results  Component Value Date/Time   CREATININE 1.46 (H) 08/25/2022 10:03 AM   CREATININE 1.27 (H) 02/25/2022 02:36 PM   GFRNONAA 41 (L) 05/12/2020 08:59 AM   GFRAA 48 (L) 05/12/2020 08:59 AM      Latest Ref Rng & Units 08/25/2022   10:03 AM 02/25/2022    2:36 PM 02/01/2022    8:05 AM  BMP  Glucose 65 - 99 mg/dL 149  159  180   BUN 7 - 25 mg/dL 33  31  31   Creatinine 0.60 - 1.00 mg/dL 1.46  1.27  1.44   BUN/Creat Ratio 6 - 22 (calc) '23  24  22   '$ Sodium 135 - 146 mmol/L 138  140  140   Potassium 3.5 - 5.3 mmol/L 4.6  4.3  4.3   Chloride 98 - 110 mmol/L 103   106  107   CO2 20 - 32 mmol/L '27  26  24   '$ Calcium 8.6 - 10.4 mg/dL 8.7  8.9  8.8    Current antihypertensive regimen:  Diltiazem CD 240 mg daily:  Furosemide 40 mg twice daily as needed Irbersartan 300 mg daily:   Patient verbally confirms she is taking the above medications as directed. Yes  How often are you checking your Blood Pressure? daily  she checks her blood pressure in the morning before taking her medication.  Current home BP readings: 134/63  and 148/67  Wrist or arm cuff: Arm but patient stated she needs a new one  Any readings above 180/100? No  What recent interventions/DTPs have been made by any provider to improve Blood Pressure control since last CPP Visit: None ID  Any recent hospitalizations or ED visits since last visit with CPP? No   Adherence Review: Is the patient currently on ACE/ARB medication? Yes Does the patient have >5 day gap between last estimated fill dates? No  Star Rating Drugs:  Trulicity 3 mg patient receives this from [employer] [facility] Irbesartan 300 mg last filled on 09/05/2022 f\or a 90-Day supply with [facility] Pravastatin 80 mg  last filled on 08/17/2022 f\or a 90-Day supply with [facility]  I spoke with the patient and she reports she is doing well. Patient states that her blood pressure when she went to see PCP last month was good. She stated that she has been having some normal numbers at home but she feels that since she has to hold her cuff that her blood pressure machine numbers may be a little off. Patient is going to get a new machine to see if her numbers improve at home.  Patient denies any ill symptoms at this time and is taking all her medications as directed. Patient was approved for her Trulicity and Humalog via [employer] and stated she has received a latest shipment. Patient reports that her blood sugars lately have been running between 108-130 before a meal.   Patient has  no other concerns or issues at this time.  [name], CPA/CMA Clinical Pharmacist Assistant Phone: [phone]

## 2022-10-04 NOTE — Telephone Encounter (Signed)
Incoming fax from [employer] was denied.   " Documentation of 3 % out-of-pocket prescription expenses, based on household adjusted income not met. "

## 2022-10-22 NOTE — Telephone Encounter (Signed)
The patient has been made aware from [facility] and is working on getting the information.  Medication Samples have been provided to the patient.  Drug name: ELiquis       Strength: '5mg'$         Qty: 3 boxes  LOT: [id number]  Exp.Date: 01/2024

## 2022-10-25 DIAGNOSIS — N183 Chronic kidney disease, stage 3 unspecified: Secondary | ICD-10-CM | POA: Diagnosis not present

## 2022-10-25 DIAGNOSIS — D631 Anemia in chronic kidney disease: Secondary | ICD-10-CM | POA: Diagnosis not present

## 2022-10-25 DIAGNOSIS — E1322 Other specified diabetes mellitus with diabetic chronic kidney disease: Secondary | ICD-10-CM | POA: Diagnosis not present

## 2022-10-25 DIAGNOSIS — E559 Vitamin D deficiency, unspecified: Secondary | ICD-10-CM | POA: Diagnosis not present

## 2022-10-25 DIAGNOSIS — N185 Chronic kidney disease, stage 5: Secondary | ICD-10-CM | POA: Diagnosis not present

## 2022-11-03 DIAGNOSIS — N185 Chronic kidney disease, stage 5: Secondary | ICD-10-CM | POA: Diagnosis not present

## 2022-11-03 DIAGNOSIS — N183 Chronic kidney disease, stage 3 unspecified: Secondary | ICD-10-CM | POA: Diagnosis not present

## 2022-11-03 DIAGNOSIS — D631 Anemia in chronic kidney disease: Secondary | ICD-10-CM | POA: Diagnosis not present

## 2022-11-03 DIAGNOSIS — E1322 Other specified diabetes mellitus with diabetic chronic kidney disease: Secondary | ICD-10-CM | POA: Diagnosis not present

## 2022-11-03 DIAGNOSIS — E559 Vitamin D deficiency, unspecified: Secondary | ICD-10-CM | POA: Diagnosis not present

## 2022-11-04 DIAGNOSIS — G4733 Obstructive sleep apnea (adult) (pediatric): Secondary | ICD-10-CM | POA: Diagnosis not present

## 2022-11-09 ENCOUNTER — Other Ambulatory Visit: Payer: Self-pay | Admitting: Family Medicine

## 2022-11-09 DIAGNOSIS — Z1231 Encounter for screening mammogram for malignant neoplasm of breast: Secondary | ICD-10-CM

## 2022-11-11 ENCOUNTER — Encounter: Payer: Self-pay | Admitting: Cardiovascular Disease

## 2022-11-11 ENCOUNTER — Ambulatory Visit: Payer: Medicare PPO | Attending: Cardiovascular Disease | Admitting: Cardiovascular Disease

## 2022-11-11 VITALS — BP 160/80 | HR 69 | Ht 70.0 in | Wt 352.5 lb

## 2022-11-11 DIAGNOSIS — G4733 Obstructive sleep apnea (adult) (pediatric): Secondary | ICD-10-CM | POA: Diagnosis not present

## 2022-11-11 DIAGNOSIS — I4821 Permanent atrial fibrillation: Secondary | ICD-10-CM

## 2022-11-11 DIAGNOSIS — I4891 Unspecified atrial fibrillation: Secondary | ICD-10-CM

## 2022-11-11 DIAGNOSIS — I10 Essential (primary) hypertension: Secondary | ICD-10-CM | POA: Diagnosis not present

## 2022-11-11 DIAGNOSIS — I5032 Chronic diastolic (congestive) heart failure: Secondary | ICD-10-CM

## 2022-11-11 MED ORDER — APIXABAN 5 MG PO TABS: 5.0000 mg | ORAL_TABLET | Freq: Two times a day (BID) | ORAL | 0 refills | Status: DC

## 2022-11-11 NOTE — Patient Instructions (Signed)
Medication Instructions:  Your physician recommends that you continue on your current medications as directed. Please refer to the Current Medication list given to you today.  *If you need a refill on your cardiac medications before your next appointment, please call your pharmacy*   Lab Work: No labs ordered  If you have labs (blood work) drawn today and your tests are completely normal, you will receive your results only by: MyChart Message (if you have MyChart) OR A paper copy in the mail If you have any lab test that is abnormal or we need to change your treatment, we will call you to review the results.   Testing/Procedures: No testing ordered  Follow-Up: At [facility], you and your health needs are our priority.  As part of our continuing mission to provide you with exceptional heart care, we have created designated Provider Care Teams.  These Care Teams include your primary Cardiologist (physician) and Advanced Practice Providers (APPs -  Physician Assistants and Nurse Practitioners) who all work together to provide you with the care you need, when you need it.  We recommend signing up for the patient portal called "MyChart".  Sign up information is provided on this After Visit Summary.  MyChart is used to connect with patients for Virtual Visits (Telemedicine).  Patients are able to view lab/test results, encounter notes, upcoming appointments, etc.  Non-urgent messages can be sent to your provider as well.   To learn more about what you can do with MyChart, go to [email or web address].    Your next appointment:   6 month(s)  Provider:   You may see Muhammad Arida, MD or one of the following Advanced Practice Providers on your designated Care Team:   Christopher Berge, NP Ryan Dunn, PA-C Cadence Furth, PA-C Sheri Hammock, NP  

## 2022-11-11 NOTE — Progress Notes (Signed)
Cardiology Office Note   Date:  11/11/2022   ID:  TEYLOR GERTH, DOB [birth year], MRN [id number]  PCP:  Steele Sizer, MD  Cardiologist:   Kathlyn Sacramento, MD   Chief Complaint  Patient presents with   Other    6 month f/u req. Samples Eliquis due to cost. Meds reviewed verbally with pt.      History of Present Illness: MCKENNZIE GORT is a 77 y.o. female who presents for a followup visit regarding chronic diastolic heart failure and chronic atrial fibrillation. 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  Atrial fibrillation is being treated with rate control and anticoagulation.  Wilder Glade was associated with a rash and thus was discontinued. She has been doing very well with no chest pain or worsening dyspnea.  She has been under increased stress due to illness of her husband and some financial issues.  She was denied for the Eliquis assistance.  Past Medical History:  Diagnosis Date   Allergic rhinitis, cause unspecified    Anxiety    Chronic diastolic CHF (congestive heart failure) (HCC)    CKD (chronic kidney disease), stage III (HCC)    Edema    Essential hypertension, benign    Gout, unspecified    Heart murmur    as child   Hyperlipidemia    Lipoma of unspecified site    Other and unspecified disc disorder of lumbar region    Other general symptoms(780.99)    PAC (premature atrial contraction)    Renal insufficiency    Restless legs syndrome (RLS)    Sebaceous cyst    Super obesity    Type II or unspecified type diabetes mellitus without mention of complication, uncontrolled    Unspecified asthma(493.90)    Unspecified disease of pericardium    Unspecified sleep apnea    Unspecified vitamin D deficiency    Vaginitis and vulvovaginitis, unspecified    Venous insufficiency      Past Surgical History:  Procedure Laterality Date   CARDIAC CATHETERIZATION  2002   [facility]   CATARACT EXTRACTION W/PHACO Right 09/15/2021   Procedure: CATARACT EXTRACTION PHACO AND INTRAOCULAR LENS PLACEMENT (IOC) RIGHT 10.19 01:01.8;  Surgeon: Birder Robson, MD;  Location: [place];  Service: Ophthalmology;  Laterality: Right;   CATARACT EXTRACTION W/PHACO Left 09/29/2021   Procedure: CATARACT EXTRACTION PHACO AND INTRAOCULAR LENS PLACEMENT (IOC) LEFT DIABETIC 11.40 01:21.2;  Surgeon: Birder Robson, MD;  Location: [place];  Service: Ophthalmology;  Laterality: Left;   COLONOSCOPY WITH PROPOFOL N/A 09/25/2018   Procedure: COLONOSCOPY WITH PROPOFOL;  Surgeon: Jonathon Bellows, MD;  Location: [facility] ENDOSCOPY;  Service: Gastroenterology;  Laterality: N/A;   COLONOSCOPY WITH PROPOFOL N/A 04/13/2019   Procedure: COLONOSCOPY WITH PROPOFOL;  Surgeon: Jonathon Bellows, MD;  Location: [facility] ENDOSCOPY;  Service: Gastroenterology;  Laterality: N/A;   COLONOSCOPY WITH PROPOFOL N/A 06/27/2020   Procedure: COLONOSCOPY WITH PROPOFOL;  Surgeon: Jonathon Bellows, MD;  Location: [facility] ENDOSCOPY;  Service: Gastroenterology;  Laterality: N/A;   PERICARDIUM SURGERY       Current Outpatient Medications  Medication Sig Dispense Refill   acetaminophen (TYLENOL) 650 MG CR tablet Take 650 mg by mouth in the morning and at bedtime.     albuterol (VENTOLIN HFA) 108 (90 Base) MCG/ACT inhaler Inhale 2 puffs into the lungs every 6 (six) hours as needed for wheezing or shortness of breath.  18 g 0   Alcohol Swabs (DROPSAFE ALCOHOL PREP) 70 % PADS USE AS DIRECTED 300 each 3   allopurinol (ZYLOPRIM) 100 MG tablet Take 1 tablet (100 mg total) by mouth 2 (two) times daily. 180 tablet 1   apixaban (ELIQUIS) 5 MG TABS tablet Take 1 tablet (5 mg total) by mouth 2 (two) times daily. 180 tablet 2   Blood Glucose Calibration (TRUE METRIX LEVEL 1) Low SOLN 1 each by In Vitro route once a week. 1 each 1   Blood Glucose  Monitoring Suppl (TRUE METRIX AIR GLUCOSE METER) w/Device KIT Inject 1 each into the skin 4 (four) times daily as needed. Check fsbs 4 times daily E11.22 1 kit 0   Cholecalciferol (VITAMIN D) 2000 UNITS tablet Take 2,000 Units by mouth daily.     colchicine (COLCRYS) 0.6 MG tablet Take 1-2 tablets (0.6-1.2 mg total) by mouth daily as needed. 30 tablet 0   diltiazem (CARDIZEM CD) 240 MG 24 hr capsule TAKE 1 CAPSULE EVERY DAY 90 capsule 0   DROPLET PEN NEEDLES 30G X 8 MM MISC USE TWICE DAILY WITH INSULIN 200 each 3   Dulaglutide (TRULICITY) 3 0000000 SOPN Inject 3 mg as directed once a week. 6 mL 3   Elastic Bandages & Supports (MEDICAL COMPRESSION STOCKINGS) MISC 2 each by Does not apply route daily. 2 each 2   fluticasone (FLONASE) 50 MCG/ACT nasal spray Place 2 sprays into both nostrils daily. 48 g 1   fluticasone-salmeterol (ADVAIR) 250-50 MCG/ACT AEPB INHALE 1 PUFF BY MOUTH TWICE DAILY 180 each 1   furosemide (LASIX) 40 MG tablet Take 1 tablet (40 mg total) by mouth daily. 90 tablet 1   Insulin Lispro Prot & Lispro (HUMALOG MIX 75/25 KWIKPEN) (75-25) 100 UNIT/ML Kwikpen Inject 10 Units into the skin 2 (two) times daily with a meal.     irbesartan (AVAPRO) 300 MG tablet Take 1 tablet (300 mg total) by mouth daily. 90 tablet 1   loratadine (CLARITIN) 10 MG tablet Take 1 tablet (10 mg total) by mouth daily. 30 tablet 11   Magnesium Chloride 64 MG TBEC Take 1 tablet by mouth daily.     pravastatin (PRAVACHOL) 80 MG tablet Take 1 tablet (80 mg total) by mouth every evening. 90 tablet 1   TRUE METRIX BLOOD GLUCOSE TEST test strip CHECK BLOOD SUGAR FOUR TIMES DAILY 400 strip 3   TRUEplus Lancets 33G MISC TEST BLOOD SUGAR THREE TIMES DAILY AS NEEDED 100 each 3   TYLENOL 325 MG tablet      potassium chloride SA (KLOR-CON M) 20 MEQ tablet Take 1 tablet (20 mEq total) by mouth daily. With furosemide (Patient not taking: Reported on 11/11/2022) 90 tablet 1   No current facility-administered medications  for this visit.    Allergies:   Farxiga [dapagliflozin]    Social History:  The patient  reports that she has never smoked. She has never used smokeless tobacco. She reports that she does not drink alcohol and does not use drugs.   Family History:  The patient's family history includes Heart attack in her mother; Hypertension in her mother.    ROS:  Please see the history of present illness.   Otherwise, review of systems are positive for none.   All other systems are reviewed and negative.    PHYSICAL EXAM: VS:  BP (!) 160/80 (BP Location: Right Arm)   Pulse 69   Ht 5\' 10"  (1.778 m)   Wt (!) 352 lb 8 oz (  159.9 kg)   SpO2 99%   BMI 50.58 kg/m  , BMI Body mass index is 50.58 kg/m. GEN: Well nourished, well developed, in no acute distress  HEENT: normal  Neck: no JVD, carotid bruits, or masses Cardiac: Irregularly irregular; no rubs, or gallops.  1/ 6 systolic murmur in the aortic area.  Trace bilateral leg edema. Respiratory:  clear to auscultation bilaterally, normal work of breathing GI: soft, nontender, nondistended, + BS MS: no deformity or atrophy  Skin: warm and dry, no rash Neuro:  Strength and sensation are intact Psych: euthymic mood, full affect   EKG:  EKG is ordered today. The ekg ordered today demonstrates atrial fibrillation with ventricular rate of 70 bpm.  Poor R wave progression in the inferior leads.    Recent Labs: 02/25/2022: Brain Natriuretic Peptide 61 08/25/2022: ALT 7; BUN 33; Creat 1.46; Hemoglobin 10.7; Platelets 224; Potassium 4.6; Sodium 138    Lipid Panel    Component Value Date/Time   CHOL 137 08/25/2022 1003   CHOL 139 04/16/2015 1138   TRIG 102 08/25/2022 1003   HDL 48 (L) 08/25/2022 1003   HDL 52 04/16/2015 1138   CHOLHDL 2.9 08/25/2022 1003   VLDL 19 12/23/2016 0941   LDLCALC 70 08/25/2022 1003      Wt Readings from Last 3 Encounters:  11/11/22 (!) 352 lb 8 oz (159.9 kg)  08/25/22 (!) 358 lb (162.4 kg)  05/06/22 (!) 354 lb 4  oz (160.7 kg)           No data to display            ASSESSMENT AND PLAN:  1.  Chronic atrial fibrillation:  Chads Vasc score is 5 .  Continue anticoagulation with Eliquis.  I reviewed most recent labs which showed a creatinine of 1.65.  Will provide her with samples of Eliquis and try to help with that assistance application.  2. Chronic diastolic heart failure: She uses furosemide on average of 3 times per week and she currently appears to be euvolemic.  3. Essential hypertension: Her blood pressure is elevated today but she has been under stress.  Reviewing the notes from last few visits showed that her blood pressure was controlled.  I elected not to make any changes today.  4.  Obstructive sleep apnea: She uses CPAP on a regular basis.   Disposition:   FU with me in 6 months  Signed,  Kathlyn Sacramento, MD  11/11/2022 9:26 AM    [place]

## 2022-11-11 NOTE — Addendum Note (Signed)
Addended by: Ricci Barker on: 11/11/2022 09:30 AM   Modules accepted: Orders

## 2022-11-22 ENCOUNTER — Other Ambulatory Visit: Payer: Self-pay | Admitting: Cardiovascular Disease

## 2022-11-22 DIAGNOSIS — I10 Essential (primary) hypertension: Secondary | ICD-10-CM

## 2022-12-03 ENCOUNTER — Ambulatory Visit
Admission: RE | Admit: 2022-12-03 | Discharge: 2022-12-03 | Disposition: A | Discharge status: Home / Self Care | Payer: Medicare PPO | Source: Ambulatory Visit | Attending: Family Medicine | Admitting: Family Medicine

## 2022-12-03 DIAGNOSIS — Z1231 Encounter for screening mammogram for malignant neoplasm of breast: Secondary | ICD-10-CM | POA: Diagnosis not present

## 2022-12-23 ENCOUNTER — Other Ambulatory Visit: Payer: Self-pay

## 2022-12-23 DIAGNOSIS — N183 Chronic kidney disease, stage 3 unspecified: Secondary | ICD-10-CM

## 2022-12-23 MED ORDER — TRUEPLUS LANCETS 33G MISC: 3 refills | Status: DC

## 2023-01-06 NOTE — Progress Notes (Signed)
Name: Amber Reyes   MRN: [id number]    DOB: 02-10-1946   Date:01/07/2023       Progress Note  Subjective  Chief Complaint  Follow Up  HPI  DMII with complications: She has been compliant with medication, she is on  Humalog 75/25 ( current taking  10 units twice daily)  since  Feb 2018, also started on Trulicity in 2017 HgbA1C was 6.5%  6.7% ,6.9%, 6.8 % , 7 %, 7%  7.6 %,it went up to 7.9 % and today is 6.9 %  ( she is allergic to [place]) she resumed a diabetic diet   Taking Avapro for microalbuminuria and CKI stage III ,  she is on statin for dyslipidemia, she has diabetes associated with obesity.   HTN/CHF/Afib/Pulmonary hypertension/dilated aorta  : taking medications and denies side effects of medications, bp today is slightly up, she states she had stopped taking lasix due to cramps and bp spiked, seen by nephrologist and advised to take half lasix 40 mg twice daily and add magnesium. BP today with our cuff and her bp cuff are similar in the 140's She denies chest pain no recent palpitation, found to have Afib on EKG done by Dr. Kirke Corin and takes Cardizem for rate control   She has orthopnea - uses two pillows , SOB is stable.  Last Echo 03/2022  LV function is still normal, but still has left and right atrial dilation, pulmonary hypertension and grade II diastolic dysfunction  She takes Eliquis and has been compliant with medication.Dr. Kirke Corin gives her refills and is trying to get her approved for assistance program   Hyperlipidemia: taking Pravastatin, we will continue current medication ,last LDL was  at goal , down to 70    CKIII: she is going to see nephrologist at [facility], last labs reviewed still stage III CKI , last GFR was 132   denies  pruritis, she has good urine output, she is not taking potassium because she had hyperkalemia - but avoiding lasix, advised to take one potassium daily when she takes lasix  , she has secondary hyperparathyroidism , reviewed labs done by nephrologist  lately    OSA: she was wearing CPAP machine every night, wakes up feeling rested.Unchanged   Morbid Obesity: she gets Trulicity through [employer] she initially lost a lot of weight but now is stable,  down 2 lbs since last visit    Asthma Moderate persistent/Chronic bronchitis: . She is compliant and uses Advair twice daily as prescribed.  No wheezing , morning cough but is dry and it the mornings after removing CPAP , her SOB is back to baseline. She needs refill of albuterol   Atherosclerosis of aorta and echo showed mild dilation of ascending aorta, continue statin and eliquis and pravastatin . LDL at goal   Chronic back pain: she cannot stand or walk for a long time because increases the pain, dull pain, no radiculitis. She states Tylenol controls symptoms . Stable   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) 02/06/2020   History of colonic polyps    Polyp of colon    Thoracic aortic atherosclerosis (HCC) 12/30/2016  Controlled type 2 diabetes mellitus with microalbuminuria, with long-term current use of insulin (HCC) 12/23/2016   Venous insufficiency 05/21/2016   Hypertensive heart disease 05/21/2016   Anemia in chronic kidney disease 12/22/2015   Pulmonary hypertension (HCC) 12/18/2015   Xanthelasma 04/15/2015   Anxiety 04/12/2015   Chronic kidney disease (CKD), stage III (moderate) (HCC) 04/12/2015   Edema extremities 04/12/2015   Disc disorder of lumbar region 04/12/2015   Asthma, moderate persistent 04/12/2015   Morbid obesity (HCC) 04/12/2015   Obstructive apnea 04/12/2015   Restless leg 04/12/2015    Allergic rhinitis 04/12/2015   Vitamin D deficiency 04/12/2015   Controlled gout 04/12/2015   Premature atrial contractions 11/29/2013   Atrial fibrillation (HCC) 11/09/2013   Benign essential hypertension    Hyperlipidemia     Past Surgical History:  Procedure Laterality Date   CARDIAC CATHETERIZATION  2002   [facility]   CATARACT EXTRACTION W/PHACO Right 09/15/2021   Procedure: CATARACT EXTRACTION PHACO AND INTRAOCULAR LENS PLACEMENT (IOC) RIGHT 10.19 01:01.8;  Surgeon: Galen Manila, MD;  Location[facility];  Service: Ophthalmology;  Laterality: Right;   CATARACT EXTRACTION W/PHACO Left 09/29/2021   Procedure: CATARACT EXTRACTION PHACO AND INTRAOCULAR LENS PLACEMENT (IOC) LEFT DIABETIC 11.40 01:21.2;  Surgeon: Galen Manila, MD;  Location: [facility];  Service: Ophthalmology;  Laterality: Left;   COLONOSCOPY WITH PROPOFOL N/A 09/25/2018   Procedure: COLONOSCOPY WITH PROPOFOL;  Surgeon: Wyline Mood, MD;  Location: [facility]OSCOPY;  Service: Gastroenterology;  Laterality: N/A;   COLONOSCOPY WITH PROPOFOL N/A 04/13/2019   Procedure: COLONOSCOPY WITH PROPOFOL;  Surgeon: Wyline Mood, MD;  Location: [facility] ENDOSCOPY;  Service: Gastroenterology;  Laterality: N/A;   COLONOSCOPY WITH PROPOFOL N/A 06/27/2020   Procedure: COLONOSCOPY WITH PROPOFOL;  Surgeon: Wyline Mood, MD;  Location: [facility] ENDOSCOPY;  Service: Gastroenterology;  Laterality: N/A;   PERICARDIUM SURGERY      Family History  Problem Relation Age of Onset   Heart attack Mother    Hypertension Mother    Breast cancer Neg Hx     Social History   Tobacco Use   Smoking status: Never   Smokeless tobacco: Never   Tobacco comments:    n/a  Substance Use Topics   Alcohol use: No    Alcohol/week: 0.0 standard drinks of alcohol     Current Outpatient Medications:    acetaminophen (TYLENOL) 650 MG CR tablet, Take 650 mg by mouth in the morning and at bedtime., Disp: , Rfl:    albuterol (VENTOLIN HFA) 108 (90  Base) MCG/ACT inhaler, Inhale 2 puffs into the lungs every 6 (six) hours as needed for wheezing or shortness of breath., Disp: 18 g, Rfl: 0   Alcohol Swabs (DROPSAFE ALCOHOL PREP) 70 % PADS, USE AS DIRECTED, Disp: 300 each, Rfl: 3   allopurinol (ZYLOPRIM) 100 MG tablet, Take 1 tablet (100 mg total) by mouth 2 (two) times daily., Disp: 180 tablet, Rfl: 1   apixaban (ELIQUIS) 5 MG TABS tablet, Take 1 tablet (5 mg total) by mouth 2 (two) times daily., Disp: 7 tablet, Rfl: 0   Blood Glucose Calibration (TRUE METRIX LEVEL 1) Low SOLN, 1 each by In Vitro route once a week., Disp: 1 each, Rfl: 1   Blood Glucose Monitoring Suppl (TRUE METRIX AIR GLUCOSE METER) w/Device KIT, Inject 1 each into the skin 4 (four) times daily as needed. Check fsbs 4 times daily E11.22, Disp: 1 kit, Rfl: 0   Cholecalciferol (VITAMIN D) 2000 UNITS tablet, Take 2,000 Units by mouth daily., Disp: ,  Rfl:    colchicine (COLCRYS) 0.6 MG tablet, Take 1-2 tablets (0.6-1.2 mg total) by mouth daily as needed., Disp: 30 tablet, Rfl: 0   diltiazem (CARDIZEM CD) 240 MG 24 hr capsule, TAKE 1 CAPSULE EVERY DAY, Disp: 90 capsule, Rfl: 1   DROPLET PEN NEEDLES 30G X 8 MM MISC, USE TWICE DAILY WITH INSULIN, Disp: 200 each, Rfl: 3   Dulaglutide (TRULICITY) 3 MG/0.5ML SOPN, Inject 3 mg as directed once a week., Disp: 6 mL, Rfl: 3   Elastic Bandages & Supports (MEDICAL COMPRESSION STOCKINGS) MISC, 2 each by Does not apply route daily., Disp: 2 each, Rfl: 2   fluticasone (FLONASE) 50 MCG/ACT nasal spray, Place 2 sprays into both nostrils daily., Disp: 48 g, Rfl: 1   fluticasone-salmeterol (ADVAIR) 250-50 MCG/ACT AEPB, INHALE 1 PUFF BY MOUTH TWICE DAILY, Disp: 180 each, Rfl: 1   furosemide (LASIX) 40 MG tablet, Take 1 tablet (40 mg total) by mouth daily., Disp: 90 tablet, Rfl: 1   Insulin Lispro Prot & Lispro (HUMALOG MIX 75/25 KWIKPEN) (75-25) 100 UNIT/ML Kwikpen, Inject 10 Units into the skin 2 (two) times daily with a meal., Disp: , Rfl:     irbesartan (AVAPRO) 300 MG tablet, Take 1 tablet (300 mg total) by mouth daily., Disp: 90 tablet, Rfl: 1   loratadine (CLARITIN) 10 MG tablet, Take 1 tablet (10 mg total) by mouth daily., Disp: 30 tablet, Rfl: 11   Magnesium Chloride 64 MG TBEC, Take 1 tablet by mouth daily., Disp: , Rfl:    potassium chloride SA (KLOR-CON M) 20 MEQ tablet, Take 1 tablet (20 mEq total) by mouth daily. With furosemide, Disp: 90 tablet, Rfl: 1   pravastatin (PRAVACHOL) 80 MG tablet, Take 1 tablet (80 mg total) by mouth every evening., Disp: 90 tablet, Rfl: 1   TRUE METRIX BLOOD GLUCOSE TEST test strip, CHECK BLOOD SUGAR FOUR TIMES DAILY, Disp: 400 strip, Rfl: 3   TRUEplus Lancets 33G MISC, TEST BLOOD SUGAR THREE TIMES DAILY AS NEEDED, Disp: 100 each, Rfl: 3   TYLENOL 325 MG tablet, , Disp: , Rfl:   Allergies  Allergen Reactions   Farxiga [Dapagliflozin] Hives    I personally reviewed active problem list, medication list, allergies, family history, social history, health maintenance with the patient/caregiver today.   ROS  Ten systems reviewed and is negative except as mentioned in HPI   Objective  Vitals:   01/07/23 0755 01/07/23 0803  BP: (!) 146/64 (!) 142/68  Pulse: 91   Resp: 16   SpO2: 100%   Weight: (!) 351 lb (159.2 kg)   Height: 5\' 10"  (1.778 m)     Body mass index is 50.36 kg/m.  Physical Exam  Constitutional: Patient appears well-developed and well-nourished. Obese  No distress.  HEENT: head atraumatic, normocephalic, pupils equal and reactive to light, neck supple Cardiovascular: Normal rate, irregular rhythm and normal heart sounds.  No murmur heard. Trace BLE edema. Pulmonary/Chest: Effort normal and breath sounds normal. No respiratory distress. Abdominal: Soft.  There is no tenderness. Psychiatric: Patient has a normal mood and affect. behavior is normal. Judgment and thought content normal.   Recent Results (from the past 2160 hour(s))  POCT HgB A1C     Status: Abnormal    Collection Time: 01/07/23  8:02 AM  Result Value Ref Range   Hemoglobin A1C 6.9 (A) 4.0 - 5.6 %   HbA1c POC (<> result, manual entry)     HbA1c, POC (prediabetic range)     HbA1c, POC (controlled  diabetic range)       PHQ2/9:    01/07/2023    8:02 AM 08/25/2022    9:17 AM 06/24/2022    8:01 AM 03/16/2022    8:05 AM 02/25/2022    1:49 PM  Depression screen PHQ 2/9  Decreased Interest 0 0 0 0 0  Down, Depressed, Hopeless 0 0 0 0 0  PHQ - 2 Score 0 0 0 0 0  Altered sleeping 0 0  0 0  Tired, decreased energy 0 0  0 0  Change in appetite 0 0  0 0  Feeling bad or failure about yourself  0 0  0 0  Trouble concentrating 0 0  0 0  Moving slowly or fidgety/restless 0 0  0 0  Suicidal thoughts 0 0  0 0  PHQ-9 Score 0 0  0 0  Difficult doing work/chores     Not difficult at all    phq 9 is negative   Fall Risk:    01/07/2023    8:01 AM 08/25/2022    9:17 AM 06/24/2022    8:04 AM 03/16/2022    8:05 AM 02/25/2022    1:49 PM  Fall Risk   Falls in the past year? 0 0 0 0 0  Number falls in past yr: 0 0  0 0  Injury with Fall? 0 0  0 0  Risk for fall due to : No Fall Risks No Fall Risks No Fall Risks No Fall Risks   Follow up Falls prevention discussed Falls prevention discussed Education provided;Falls prevention discussed Falls prevention discussed       Functional Status Survey: Is the patient deaf or have difficulty hearing?: No Does the patient have difficulty seeing, even when wearing glasses/contacts?: No Does the patient have difficulty concentrating, remembering, or making decisions?: No Does the patient have difficulty walking or climbing stairs?: Yes Does the patient have difficulty dressing or bathing?: No Does the patient have difficulty doing errands alone such as visiting a doctor's office or shopping?: No    Assessment & Plan  1. Controlled type 2 diabetes mellitus with microalbuminuria, with long-term current use of insulin (HCC)  - POCT HgB A1C  2. Hypertensive  heart disease with heart failure (HCC)  - furosemide (LASIX) 40 MG tablet; Take 0.5 tablets (20 mg total) by mouth 2 (two) times daily.  Dispense: 180 tablet; Refill: 1  3. Dyslipidemia associated with type 2 diabetes mellitus (HCC)  - pravastatin (PRAVACHOL) 80 MG tablet; Take 1 tablet (80 mg total) by mouth every evening.  Dispense: 90 tablet; Refill: 1  4. Dilation of aorta (HCC)  Monitored by cardiologist   5. Chronic atrial fibrillation (HCC)  Rate controlled   6. Morbid obesity (HCC)  Discussed with the patient the risk posed by an increased BMI. Discussed importance of portion control, calorie counting and at least 150 minutes of physical activity weekly. Avoid sweet beverages and drink more water. Eat at least 6 servings of fruit and vegetables daily    7. Stage 3a chronic kidney disease (HCC)  Continue follow up with nephrologist  8. Moderate persistent asthma without complication  - fluticasone-salmeterol (ADVAIR) 250-50 MCG/ACT AEPB; INHALE 1 PUFF BY MOUTH TWICE DAILY  Dispense: 180 each; Refill: 1  9. Benign essential hypertension  - irbesartan (AVAPRO) 300 MG tablet; Take 1 tablet (300 mg total) by mouth daily.  Dispense: 90 tablet; Refill: 1  10. Controlled gout  - allopurinol (ZYLOPRIM) 100 MG tablet; Take 1 tablet (  100 mg total) by mouth 2 (two) times daily.  Dispense: 180 tablet; Refill: 1

## 2023-01-07 ENCOUNTER — Encounter: Payer: Self-pay | Admitting: Family Medicine

## 2023-01-07 ENCOUNTER — Ambulatory Visit (INDEPENDENT_AMBULATORY_CARE_PROVIDER_SITE_OTHER): Payer: Medicare PPO | Admitting: Family Medicine

## 2023-01-07 VITALS — BP 142/68 | HR 91 | Resp 16 | Ht 70.0 in | Wt 351.0 lb

## 2023-01-07 DIAGNOSIS — J4540 Moderate persistent asthma, uncomplicated: Secondary | ICD-10-CM | POA: Diagnosis not present

## 2023-01-07 DIAGNOSIS — E1129 Type 2 diabetes mellitus with other diabetic kidney complication: Secondary | ICD-10-CM

## 2023-01-07 DIAGNOSIS — N1831 Chronic kidney disease, stage 3a: Secondary | ICD-10-CM

## 2023-01-07 DIAGNOSIS — I77819 Aortic ectasia, unspecified site: Secondary | ICD-10-CM | POA: Diagnosis not present

## 2023-01-07 DIAGNOSIS — E1169 Type 2 diabetes mellitus with other specified complication: Secondary | ICD-10-CM | POA: Diagnosis not present

## 2023-01-07 DIAGNOSIS — I110 Hypertensive heart disease with heart failure: Secondary | ICD-10-CM

## 2023-01-07 DIAGNOSIS — M109 Gout, unspecified: Secondary | ICD-10-CM

## 2023-01-07 DIAGNOSIS — Z794 Long term (current) use of insulin: Secondary | ICD-10-CM | POA: Diagnosis not present

## 2023-01-07 DIAGNOSIS — I482 Chronic atrial fibrillation, unspecified: Secondary | ICD-10-CM | POA: Diagnosis not present

## 2023-01-07 DIAGNOSIS — I10 Essential (primary) hypertension: Secondary | ICD-10-CM

## 2023-01-07 DIAGNOSIS — E785 Hyperlipidemia, unspecified: Secondary | ICD-10-CM

## 2023-01-07 LAB — POCT GLYCOSYLATED HEMOGLOBIN (HGB A1C): Hemoglobin A1C: 6.9 % — AB (ref 4.0–5.6)

## 2023-01-07 MED ORDER — PRAVASTATIN SODIUM 80 MG PO TABS: 80.0000 mg | ORAL_TABLET | Freq: Every evening | ORAL | 1 refills | Status: DC

## 2023-01-07 MED ORDER — ALLOPURINOL 100 MG PO TABS: 100.0000 mg | ORAL_TABLET | Freq: Two times a day (BID) | ORAL | 1 refills | Status: DC

## 2023-01-07 MED ORDER — FLUTICASONE-SALMETEROL 250-50 MCG/ACT IN AEPB: INHALATION_SPRAY | RESPIRATORY_TRACT | 1 refills | Status: DC

## 2023-01-07 MED ORDER — FUROSEMIDE 40 MG PO TABS: 20.0000 mg | ORAL_TABLET | Freq: Two times a day (BID) | ORAL | 1 refills | Status: DC

## 2023-01-07 MED ORDER — POTASSIUM CHLORIDE CRYS ER 10 MEQ PO TBCR: 10.0000 meq | EXTENDED_RELEASE_TABLET | Freq: Two times a day (BID) | ORAL | 1 refills | Status: DC

## 2023-01-07 MED ORDER — IRBESARTAN 300 MG PO TABS: 300.0000 mg | ORAL_TABLET | Freq: Every day | ORAL | 1 refills | Status: DC

## 2023-01-19 ENCOUNTER — Other Ambulatory Visit: Payer: Self-pay | Admitting: Internal Medicine

## 2023-01-19 ENCOUNTER — Ambulatory Visit: Payer: Medicare PPO

## 2023-01-19 DIAGNOSIS — Z794 Long term (current) use of insulin: Secondary | ICD-10-CM

## 2023-01-19 DIAGNOSIS — I10 Essential (primary) hypertension: Secondary | ICD-10-CM

## 2023-01-19 DIAGNOSIS — T7840XA Allergy, unspecified, initial encounter: Secondary | ICD-10-CM

## 2023-01-19 DIAGNOSIS — J4540 Moderate persistent asthma, uncomplicated: Secondary | ICD-10-CM

## 2023-01-19 MED ORDER — INSULIN LISPRO PROT & LISPRO (75-25 MIX) 100 UNIT/ML KWIKPEN
12.0000 [IU] | PEN_INJECTOR | Freq: Two times a day (BID) | SUBCUTANEOUS | Status: DC
Start: 2023-01-19 — End: 2023-06-03

## 2023-01-19 NOTE — Progress Notes (Signed)
Care Management & Coordination Services Pharmacy Note  01/19/2023 Name:  Amber Reyes MRN:  [id number] DOB:  May 10, 1946  Summary: Patient presents for follow-up consult.   Home blood pressure and blood sugars controlled.  Recommendations/Changes made from today's visit: -Will plan to retry PAP for Advair.   Follow up plan: CPP follow-up 6 months    Patient-Specific Goals:  Subjective: Amber Reyes is an 77 y.o. year old female who is a primary patient of Alba Cory, MD.  The care coordination team was consulted for assistance with disease management and care coordination needs.    Engaged with patient by telephone for follow up visit.  Recent office visits: 01/07/2023: Patient presented to Dr. Carlynn Purl for follow-up. BP 142/68.   Recent consult visits: 11/11/22: Patient presented to Dr. Kirke Corin (Cardiology) for follow-up. BP 160/80   Hospital visits: None in previous 6 months   Objective:  Lab Results  Component Value Date   CREATININE 1.46 (H) 08/25/2022   BUN 33 (H) 08/25/2022   EGFR 38 (L) 08/25/2022   GFRNONAA 41 (L) 05/12/2020   GFRAA 48 (L) 05/12/2020   NA 138 08/25/2022   K 4.6 08/25/2022   CALCIUM 8.7 08/25/2022   CO2 27 08/25/2022   GLUCOSE 149 (H) 08/25/2022    Lab Results  Component Value Date/Time   HGBA1C 6.9 (A) 01/07/2023 08:02 AM   HGBA1C 7.9 (A) 08/25/2022 09:18 AM   HGBA1C 7.6 (H) 11/11/2021 09:37 AM   HGBA1C 6.8 (H) 05/14/2019 12:00 AM   HGBA1C 6.5 01/01/2019 07:48 AM   HGBA1C 6.6 08/30/2018 07:46 AM   HGBA1C 6.6 (A) 08/30/2018 07:46 AM   HGBA1C 6.6 08/30/2018 07:46 AM   MICROALBUR 28.0 08/25/2022 10:03 AM   MICROALBUR 31.9 07/14/2021 09:01 AM   MICROALBUR 20 08/15/2015 08:12 AM    Last diabetic Eye exam:  Lab Results  Component Value Date/Time   HMDIABEYEEXA No Retinopathy 08/19/2021 12:00 AM   HMDIABEYEEXA No Retinopathy 08/19/2021 12:00 AM    Last diabetic Foot exam: No results found for: "HMDIABFOOTEX"   Lab  Results  Component Value Date   CHOL 137 08/25/2022   HDL 48 (L) 08/25/2022   LDLCALC 70 08/25/2022   TRIG 102 08/25/2022   CHOLHDL 2.9 08/25/2022       Latest Ref Rng & Units 08/25/2022   10:03 AM 11/11/2021    9:37 AM 07/14/2021    9:01 AM  Hepatic Function  Total Protein 6.1 - 8.1 g/dL 7.1  6.7  6.6   AST 10 - 35 U/L 14  15  13    ALT 6 - 29 U/L 7  6  6    Total Bilirubin 0.2 - 1.2 mg/dL 0.5  0.4  0.5     No results found for: "TSH", "FREET4"     Latest Ref Rng & Units 08/25/2022   10:03 AM 07/14/2021    9:01 AM 11/19/2020    8:26 AM  CBC  WBC 3.8 - 10.8 Thousand/uL 8.2  8.9  7.6   Hemoglobin 11.7 - 15.5 g/dL 24.4  01.0  27.2   Hematocrit 35.0 - 45.0 % 32.9  33.4  31.9   Platelets 140 - 400 Thousand/uL 224  228  225     Lab Results  Component Value Date/Time   VD25OH 41 08/25/2022 10:03 AM   VD25OH 45 05/12/2020 08:59 AM    Clinical ASCVD: No  The 10-year ASCVD risk score (Arnett DK, et al., 2019) is: 25.8%   Values used to calculate the  score:     Age: 31 years     Sex: Female     Is Non-Hispanic African American: Yes     Diabetic: Yes     Tobacco smoker: No     Systolic Blood Pressure: 142 mmHg     Is BP treated: Yes     HDL Cholesterol: 48 mg/dL     Total Cholesterol: 137 mg/dL       09/10/1476    2:95 AM 08/25/2022    9:17 AM 06/24/2022    8:01 AM  Depression screen PHQ 2/9  Decreased Interest 0 0 0  Down, Depressed, Hopeless 0 0 0  PHQ - 2 Score 0 0 0  Altered sleeping 0 0   Tired, decreased energy 0 0   Change in appetite 0 0   Feeling bad or failure about yourself  0 0   Trouble concentrating 0 0   Moving slowly or fidgety/restless 0 0   Suicidal thoughts 0 0   PHQ-9 Score 0 0      Social History   Tobacco Use  Smoking Status Never  Smokeless Tobacco Never  Tobacco Comments   n/a   BP Readings from Last 3 Encounters:  01/07/23 (!) 142/68  11/11/22 (!) 160/80  08/25/22 128/72   Pulse Readings from Last 3 Encounters:  01/07/23 91   11/11/22 69  08/25/22 71   Wt Readings from Last 3 Encounters:  01/07/23 (!) 351 lb (159.2 kg)  11/11/22 (!) 352 lb 8 oz (159.9 kg)  08/25/22 (!) 358 lb (162.4 kg)   BMI Readings from Last 3 Encounters:  01/07/23 50.36 kg/m  11/11/22 50.58 kg/m  08/25/22 51.37 kg/m    Allergies  Allergen Reactions   Farxiga [Dapagliflozin] Hives    Medications Reviewed Today     Reviewed by Dollene Primrose, CMA (Certified Medical Assistant) on 01/07/23 at 0801  Med List Status: <None>   Medication Order Taking? Sig Documenting Provider Last Dose Status Informant  acetaminophen (TYLENOL) 650 MG CR tablet [id number] Yes Take 650 mg by mouth in the morning and at bedtime. [provider] Taking Active   albuterol (VENTOLIN HFA) 108 (90 Base) MCG/ACT inhaler [id number] Yes Inhale 2 puffs into the lungs every 6 (six) hours as needed for wheezing or shortness of breath. Alba Cory, MD Taking Active   Alcohol Swabs (DROPSAFE ALCOHOL PREP) 70 % PADS [id number] Yes USE AS DIRECTED Alba Cory, MD Taking Active   allopurinol (ZYLOPRIM) 100 MG tablet [id number] Yes Take 1 tablet (100 mg total) by mouth 2 (two) times daily. Alba Cory, MD Taking Active   apixaban (ELIQUIS) 5 MG TABS tablet [id number] Yes Take 1 tablet (5 mg total) by mouth 2 (two) times daily. Iran Ouch, MD Taking Active   Blood Glucose Calibration (TRUE METRIX LEVEL 1) Low SOLN [id number] Yes 1 each by In Vitro route once a week. Alba Cory, MD Taking Active   Blood Glucose Monitoring Suppl (TRUE METRIX AIR GLUCOSE METER) w/Device KIT [id number] Yes Inject 1 each into the skin 4 (four) times daily as needed. Check fsbs 4 times daily E11.22 Alba Cory, MD Taking Active   Cholecalciferol (VITAMIN D) 2000 UNITS tablet [id number] Yes Take 2,000 Units by mouth daily. [provider] Taking Active Self  colchicine (COLCRYS) 0.6 MG tablet [id number] Yes Take 1-2 tablets (0.6-1.2 mg total) by mouth daily as  needed. Alba Cory, MD Taking Active   diltiazem [facility] CD) 240 MG 24 hr capsule [id number] Yes TAKE 1 CAPSULE  EVERY DAY [place] Ouch, MD Taking Active   DROPLET PEN NEEDLES 30G X 8 MM MISC [id number] Yes USE TWICE DAILY WITH INSULIN Alba Cory, MD Taking Active   Dulaglutide (TRULICITY) 3 MG/0.5ML SOPN [id number] Yes Inject 3 mg as directed once a week. Alba Cory, MD Taking Active   Elastic Bandages & Supports (MEDICAL COMPRESSION STOCKINGS) [place] [id number] Yes 2 each by Does not apply route daily. Alba Cory, MD Taking Active Self  fluticasone (FLONASE) 50 MCG/ACT nasal spray [id number] Yes Place 2 sprays into both nostrils daily. Alba Cory, MD Taking Active   fluticasone-salmeterol (ADVAIR) 250-50 MCG/ACT AEPB [id number] Yes INHALE 1 PUFF BY MOUTH TWICE DAILY Carlynn Purl, Danna Hefty, MD Taking Active   furosemide (LASIX) 40 MG tablet [id number] Yes Take 1 tablet (40 mg total) by mouth daily. Alba Cory, MD Taking Active   Insulin Lispro Prot & Lispro (HUMALOG MIX 75/25 KWIKPEN) (75-25) 100 UNIT/ML [name] Coup [id number] Yes Inject 10 Units into the skin 2 (two) times daily with a meal. Carlynn Purl, Danna Hefty, MD Taking Active   irbesartan (AVAPRO) 300 MG tablet [id number] Yes Take 1 tablet (300 mg total) by mouth daily. Alba Cory, MD Taking Active   loratadine (CLARITIN) 10 MG tablet [id number] Yes Take 1 tablet (10 mg total) by mouth daily. Margarita Mail, DO Taking Active   Magnesium Chloride 64 MG TBEC [id number] Yes Take 1 tablet by mouth daily. [provider] Taking Active   potassium chloride SA (KLOR-CON M) 20 MEQ tablet [id number] Yes Take 1 tablet (20 mEq total) by mouth daily. With furosemide Alba Cory, MD Taking Active   pravastatin (PRAVACHOL) 80 MG tablet [id number] Yes Take 1 tablet (80 mg total) by mouth every evening. Alba Cory, MD Taking Active   TRUE METRIX BLOOD GLUCOSE TEST test strip [id number] Yes CHECK BLOOD SUGAR FOUR TIMES DAILY Alba Cory, MD Taking Active   TRUEplus Lancets 33G MISC [id number] Yes TEST BLOOD SUGAR THREE TIMES DAILY AS NEEDED Alba Cory, MD Taking Active   TYLENOL 325 MG tablet [id number] Yes  [provider] Taking Active             SDOH:  (Social Determinants of Health) assessments and interventions performed: Yes SDOH Interventions    Flowsheet Row Clinical Support [facility] in [facility] Chronic Care Management from 01/06/2022 in [facility] Clinical Support from 06/23/2021 in [facility] Chronic Care Management from 02/11/2021 in [facility] Chronic Care Management from 12/17/2020 in [facility] Chronic Care Management from 10/29/2020 in [facility]  SDOH Interventions        Food Insecurity Interventions Intervention Not Indicated -- Intervention Not Indicated -- -- --  Housing Interventions Intervention Not Indicated -- Intervention Not Indicated -- -- --  Transportation Interventions Intervention Not Indicated -- Intervention Not Indicated -- -- --  Utilities Interventions Intervention Not Indicated -- -- -- -- --  Alcohol Usage Interventions Intervention Not Indicated (Score <7) -- -- -- -- --  Financial Strain Interventions Intervention Not Indicated Other (Comment)  [PAP] Intervention Not Indicated  [pt receives patient assistance for medications] Other (Comment)  [PAP] Other (Comment)  [PAP] Other (Comment)  [PAP]  Physical Activity Interventions Intervention Not Indicated -- Intervention Not Indicated -- -- --  Stress Interventions Intervention Not Indicated -- Intervention Not Indicated -- -- --  Social Connections Interventions Intervention Not Indicated -- Intervention Not Indicated -- -- --  Medication Assistance:  Humalog, Trulicity obtained through [employer] medication assistance program.  Enrollment  ends Dec 2024  Medication Access: Within the past 30 days, how often has patient missed a dose of medication? None Is a pillbox or other method used to improve adherence? No  Factors that may affect medication adherence? financial need Are meds synced by current pharmacy? No  Are meds delivered by current pharmacy? No  Does patient experience delays in picking up medications due to transportation concerns? 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 Patient has completely cut out bacon from her diet and is trying to limit her sodium intake.  -Recommended to continue current medication  Atrial Fibrillation (Goal: prevent stroke and major bleeding) -Controlled:  -CHADSVASC: 5 -Current treatment: Rate control: Diltiazem CD 240 mg daily  Anticoagulation: Eliquis 5 mg twice daily  -Medications previously tried: NA -Home BP and HR readings: NA  -Counseled on avoidance of  NSAIDs due to increased bleeding risk with anticoagulants; -Recommended to continue current medication  Hyperlipidemia: (LDL goal < 70) -Controlled -Current treatment: Pravastatin 80 mg daily  -Medications previously tried: NA  -Educated on Importance of limiting foods high in cholesterol; -Recommended to continue current medication  Diabetes (A1c goal <7%) -Controlled -Current medications: Trulicity 3 mg weekly: Appropriate, Effective, Safe, Accessible   Humalog 75-25 12 units twice daily before meals: Appropriate, Effective, Safe, Accessible  Gives extra 2 units if blood sugar greater than 150 -Medications previously tried: Metformin, Farxiga (Hives) -Current home glucose readings:  111, 103, 103, 119, 118, 129, 125  -Denies hypoglycemic symptoms -Continue current medications   Asthma (Goal: control symptoms and prevent exacerbations) -Controlled -Current treatment  Proventil HFA 2 puffs every 6 hours as needed Advair 1 puff twice daily  -Medications previously tried: NA  -Exacerbations requiring treatment in last 6 months: None -Patient reports consistent use of maintenance inhaler -Frequency of rescue inhaler use: NA -Counseled on When to use rescue inhaler -Will plan to retry PAP for Advair.  -Recommended to continue current medication  Chronic Kidney Disease Stage 3a  -All medications assessed for renal dosing and appropriateness in chronic kidney disease. -Recommended to continue current medication  Follow Up Plan: Telephone follow up appointment with care management team member scheduled for:  07/13/2023 at 8:30 AM  Cheyenne Adas, CPP Clinical Pharmacist Practitioner  [facility] [phone]

## 2023-01-19 NOTE — Telephone Encounter (Signed)
Requested Prescriptions  Pending Prescriptions Disp Refills   loratadine (CLARITIN) 10 MG tablet [Pharmacy Med Name: LORATADINE 10MG  TABLETS] 30 tablet 8    Sig: TAKE 1 TABLET(10 MG) BY MOUTH DAILY     Ear, Nose, and Throat:  Antihistamines 2 Failed - 01/19/2023  3:32 AM      Failed - Cr in normal range and within 360 days    Creat  Date Value Ref Range Status  08/25/2022 1.46 (H) 0.60 - 1.00 mg/dL Final   Creatinine, Urine  Date Value Ref Range Status  08/25/2022 68 20 - 275 mg/dL Final         Passed - Valid encounter within last 12 months    Recent Outpatient Visits           1 week ago Controlled type 2 diabetes mellitus with microalbuminuria, with long-term current use of insulin [facility])   [facility] Alba Cory, MD   4 months ago Controlled type 2 diabetes mellitus with stage 3 chronic kidney disease, with long-term current use of insulin [facility])   [facility] Alba Cory, MD   10 months ago Controlled type 2 diabetes mellitus with stage 3 chronic kidney disease, with long-term current use of insulin [facility])    [facility] Alba Cory, MD   10 months ago Shortness of breath   [facility] Margarita Mail, [place]   11 months ago Shortness of breath   [facility] Margarita Mail, DO       Future Appointments             In 3 months Kirke Corin, Chelsea Aus, MD [facility] [place]   In 3 months Alba Cory, MD [facility]   In 5 months  [facility], [facility]

## 2023-01-26 ENCOUNTER — Telehealth: Payer: Self-pay

## 2023-01-26 MED ORDER — FLUTICASONE-SALMETEROL 250-50 MCG/ACT IN AEPB: 1.0000 | INHALATION_SPRAY | Freq: Two times a day (BID) | RESPIRATORY_TRACT | 11 refills | Status: DC

## 2023-01-26 NOTE — Progress Notes (Cosign Needed)
Care Coordination Pharmacy Assistant   Name: Amber Reyes  MRN: [id number] DOB: May 10, 1946  Reason for Encounter: Patient Assistance Application   I received a task from Angelena Sole, CPP requesting that I start a patient assistance application for patient's Advair. Unfortunately, at this time there is no offered patient assistance for Advair. CPP has been informed and his suggestion is to send a prescription over to patient's pharmacy for the generic Wixela.   I contacted the patient and she is agreeable to have a prescription for the [facility] sent over to her [facility]. I informed the patient I was not aware the cost or copayment for this medication, but when the pharmacy receives the prescription they will run it through her insurance and they can advise her at that time of the copayment for this medication.  Patient verbalized understanding. Message sent to CPP that the patient is agreeable to trying Wixela.    Medications: Outpatient Encounter Medications as of 01/26/2023  Medication Sig   acetaminophen (TYLENOL) 650 MG CR tablet Take 650 mg by mouth in the morning and at bedtime.   albuterol (VENTOLIN HFA) 108 (90 Base) MCG/ACT inhaler Inhale 2 puffs into the lungs every 6 (six) hours as needed for wheezing or shortness of breath.   Alcohol Swabs (DROPSAFE ALCOHOL PREP) 70 % PADS USE AS DIRECTED   allopurinol (ZYLOPRIM) 100 MG tablet Take 1 tablet (100 mg total) by mouth 2 (two) times daily.   apixaban (ELIQUIS) 5 MG TABS tablet Take 1 tablet (5 mg total) by mouth 2 (two) times daily.   Blood Glucose Calibration (TRUE METRIX LEVEL 1) Low SOLN 1 each by In Vitro route once a week.   Blood Glucose Monitoring Suppl (TRUE METRIX AIR GLUCOSE METER) w/Device KIT Inject 1 each into the skin 4 (four) times daily as needed. Check fsbs 4 times daily E11.22   Cholecalciferol (VITAMIN D) 2000 UNITS tablet Take 2,000 Units by mouth daily.   colchicine (COLCRYS) 0.6 MG tablet Take 1-2  tablets (0.6-1.2 mg total) by mouth daily as needed.   diltiazem (CARDIZEM CD) 240 MG 24 hr capsule TAKE 1 CAPSULE EVERY DAY   DROPLET PEN NEEDLES 30G X 8 MM MISC USE TWICE DAILY WITH INSULIN   Dulaglutide (TRULICITY) 3 MG/0.5ML SOPN Inject 3 mg as directed once a week.   Elastic Bandages & Supports (MEDICAL COMPRESSION STOCKINGS) MISC 2 each by Does not apply route daily.   fluticasone (FLONASE) 50 MCG/ACT nasal spray Place 2 sprays into both nostrils daily.   fluticasone-salmeterol (ADVAIR) 250-50 MCG/ACT AEPB INHALE 1 PUFF BY MOUTH TWICE DAILY   furosemide (LASIX) 40 MG tablet Take 0.5 tablets (20 mg total) by mouth 2 (two) times daily.   Insulin Lispro Prot & Lispro (HUMALOG MIX 75/25 KWIKPEN) (75-25) 100 UNIT/ML Kwikpen Inject 12 Units into the skin 2 (two) times daily with a meal.   irbesartan (AVAPRO) 300 MG tablet Take 1 tablet (300 mg total) by mouth daily.   loratadine (CLARITIN) 10 MG tablet TAKE 1 TABLET(10 MG) BY MOUTH DAILY   Magnesium Chloride 64 MG TBEC Take 1 tablet by mouth daily.   potassium chloride SA (KLOR-CON M) 10 MEQ tablet Take 1 tablet (10 mEq total) by mouth 2 (two) times daily. With furosemide   pravastatin (PRAVACHOL) 80 MG tablet Take 1 tablet (80 mg total) by mouth every evening.   TRUE METRIX BLOOD GLUCOSE TEST test strip CHECK BLOOD SUGAR FOUR TIMES DAILY   TRUEplus Lancets 33G MISC TEST BLOOD SUGAR  THREE TIMES DAILY AS NEEDED   TYLENOL 325 MG tablet    No facility-administered encounter medications on file as of 01/26/2023.   Adelene Idler, CPA/CMA Clinical Pharmacist Assistant Phone: [phone]

## 2023-01-26 NOTE — Addendum Note (Signed)
Addended by: Julious Payer A on: 01/26/2023 01:34 PM   Modules accepted: Orders

## 2023-03-16 ENCOUNTER — Telehealth: Payer: Self-pay | Admitting: Family Medicine

## 2023-03-16 NOTE — Telephone Encounter (Signed)
Pt is requesting Dr. Carlynn Purl send in a new prescription for fluconazole (DIFLUCAN) 150 MG tablet [[id number]]  DISCONTINUED . Pt says she really needs it and wants to know if it could be sent in to [id number] - [place], [place] - [address]

## 2023-03-18 ENCOUNTER — Other Ambulatory Visit: Payer: Self-pay | Admitting: Family Medicine

## 2023-03-18 MED ORDER — FLUCONAZOLE 150 MG PO TABS: 150.0000 mg | ORAL_TABLET | ORAL | 0 refills | Status: DC

## 2023-03-24 ENCOUNTER — Other Ambulatory Visit: Payer: Self-pay | Admitting: Family Medicine

## 2023-03-24 DIAGNOSIS — Z794 Long term (current) use of insulin: Secondary | ICD-10-CM

## 2023-04-13 ENCOUNTER — Other Ambulatory Visit: Payer: Self-pay | Admitting: Cardiovascular Disease

## 2023-04-13 DIAGNOSIS — I10 Essential (primary) hypertension: Secondary | ICD-10-CM

## 2023-04-21 NOTE — Telephone Encounter (Signed)
Assistance approved through 08/23/2023

## 2023-04-28 DIAGNOSIS — E559 Vitamin D deficiency, unspecified: Secondary | ICD-10-CM | POA: Diagnosis not present

## 2023-04-28 DIAGNOSIS — N183 Chronic kidney disease, stage 3 unspecified: Secondary | ICD-10-CM | POA: Diagnosis not present

## 2023-04-28 DIAGNOSIS — E1322 Other specified diabetes mellitus with diabetic chronic kidney disease: Secondary | ICD-10-CM | POA: Diagnosis not present

## 2023-04-28 DIAGNOSIS — D631 Anemia in chronic kidney disease: Secondary | ICD-10-CM | POA: Diagnosis not present

## 2023-04-28 DIAGNOSIS — N185 Chronic kidney disease, stage 5: Secondary | ICD-10-CM | POA: Diagnosis not present

## 2023-05-04 DIAGNOSIS — D631 Anemia in chronic kidney disease: Secondary | ICD-10-CM | POA: Diagnosis not present

## 2023-05-04 DIAGNOSIS — E1322 Other specified diabetes mellitus with diabetic chronic kidney disease: Secondary | ICD-10-CM | POA: Diagnosis not present

## 2023-05-04 DIAGNOSIS — E559 Vitamin D deficiency, unspecified: Secondary | ICD-10-CM | POA: Diagnosis not present

## 2023-05-04 DIAGNOSIS — N183 Chronic kidney disease, stage 3 unspecified: Secondary | ICD-10-CM | POA: Diagnosis not present

## 2023-05-04 DIAGNOSIS — N185 Chronic kidney disease, stage 5: Secondary | ICD-10-CM | POA: Diagnosis not present

## 2023-05-05 ENCOUNTER — Encounter: Payer: Self-pay | Admitting: Cardiovascular Disease

## 2023-05-05 ENCOUNTER — Ambulatory Visit: Payer: Medicare PPO | Attending: Cardiovascular Disease | Admitting: Cardiovascular Disease

## 2023-05-05 VITALS — BP 133/76 | HR 77 | Ht 70.0 in | Wt 365.5 lb

## 2023-05-05 DIAGNOSIS — I5032 Chronic diastolic (congestive) heart failure: Secondary | ICD-10-CM

## 2023-05-05 DIAGNOSIS — I482 Chronic atrial fibrillation, unspecified: Secondary | ICD-10-CM | POA: Diagnosis not present

## 2023-05-05 DIAGNOSIS — I10 Essential (primary) hypertension: Secondary | ICD-10-CM

## 2023-05-05 DIAGNOSIS — G4733 Obstructive sleep apnea (adult) (pediatric): Secondary | ICD-10-CM | POA: Diagnosis not present

## 2023-05-05 NOTE — Progress Notes (Signed)
Cardiology Office Note   Date:  05/05/2023   ID:  Amber Reyes, DOB 04-24-1946, MRN [id number]  PCP:  Alba Cory, MD  Cardiologist:   Lorine Bears, MD   Chief Complaint  Patient presents with   Follow-up    6 month f/u c/o sob. Meds reviewed verbally with pt.      History of Present Illness: Amber Reyes is a 77 y.o. female who presents for a followup visit regarding chronic diastolic heart failure and chronic atrial fibrillation. 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  Atrial fibrillation is being treated with rate control and anticoagulation.  Marcelline Deist was associated with a rash and thus was discontinued.  She has been doing reasonably well with no chest pain or palpitations.  She reports stable exertional dyspnea.  She was seen by nephrology yesterday.  Her anemia is slightly worse and she was told to start taking iron supplement.  No active bleeding.  Past Medical History:  Diagnosis Date   Allergic rhinitis, cause unspecified    Anxiety    Chronic diastolic CHF (congestive heart failure) (HCC)    CKD (chronic kidney disease), stage III (HCC)    Edema    Essential hypertension, benign    Gout, unspecified    Heart murmur    as child   Hyperlipidemia    Lipoma of unspecified site    Other and unspecified disc disorder of lumbar region    Other general symptoms(780.99)    PAC (premature atrial contraction)    Renal insufficiency    Restless legs syndrome (RLS)    Sebaceous cyst    Super obesity    Type II or unspecified type diabetes mellitus without mention of complication, uncontrolled    Unspecified asthma(493.90)    Unspecified disease of pericardium    Unspecified sleep apnea    Unspecified vitamin D deficiency    Vaginitis and vulvovaginitis, unspecified     Venous insufficiency     Past Surgical History:  Procedure Laterality Date   CARDIAC CATHETERIZATION  2002   [facility]   CATARACT EXTRACTION W/PHACO Right 09/15/2021   Procedure: CATARACT EXTRACTION PHACO AND INTRAOCULAR LENS PLACEMENT (IOC) RIGHT 10.19 01:01.8;  Surgeon: Galen Manila, MD;  Location: [facility];  Service: Ophthalmology;  Laterality: Right;   CATARACT EXTRACTION W/PHACO Left 09/29/2021   Procedure: CATARACT EXTRACTION PHACO AND INTRAOCULAR LENS PLACEMENT (IOC) LEFT DIABETIC 11.40 01:21.2;  Surgeon: Galen Manila, MD;  Location: [facility];  Service: Ophthalmology;  Laterality: Left;   COLONOSCOPY WITH PROPOFOL N/A 09/25/2018   Procedure: COLONOSCOPY WITH PROPOFOL;  Surgeon: Wyline Mood, MD;  Location: [facility] ENDOSCOPY;  Service: Gastroenterology;  Laterality: N/A;   COLONOSCOPY WITH PROPOFOL N/A 04/13/2019   Procedure: COLONOSCOPY WITH PROPOFOL;  Surgeon: Wyline Mood, MD;  Location: [facility] ENDOSCOPY;  Service: Gastroenterology;  Laterality: N/A;   COLONOSCOPY WITH PROPOFOL N/A 06/27/2020   Procedure: COLONOSCOPY WITH PROPOFOL;  Surgeon: Wyline Mood, MD;  Location: [facility] ENDOSCOPY;  Service: Gastroenterology;  Laterality: N/A;   PERICARDIUM SURGERY       Current Outpatient Medications  Medication Sig Dispense Refill   acetaminophen (TYLENOL) 650 MG CR tablet Take 650 mg by mouth in the morning and at bedtime.     albuterol (VENTOLIN HFA) 108 (90 Base) MCG/ACT inhaler Inhale 2 puffs into the lungs every 6 (six) hours as needed for wheezing or  shortness of breath. 18 g 0   Alcohol Swabs (DROPSAFE ALCOHOL PREP) 70 % PADS USE AS DIRECTED 300 each 3   allopurinol (ZYLOPRIM) 100 MG tablet Take 1 tablet (100 mg total) by mouth 2 (two) times daily. 180 tablet 1   apixaban (ELIQUIS) 5 MG TABS tablet Take 1 tablet (5 mg total) by mouth 2 (two) times daily. 7 tablet 0   Blood Glucose Calibration (TRUE METRIX LEVEL 1) Low SOLN 1 each by In Vitro route once a week. 1 each 1    Blood Glucose Monitoring Suppl (TRUE METRIX AIR GLUCOSE METER) w/Device KIT Inject 1 each into the skin 4 (four) times daily as needed. Check fsbs 4 times daily E11.22 1 kit 0   Cholecalciferol (VITAMIN D) 2000 UNITS tablet Take 2,000 Units by mouth daily.     colchicine (COLCRYS) 0.6 MG tablet Take 1-2 tablets (0.6-1.2 mg total) by mouth daily as needed. 30 tablet 0   diltiazem (CARDIZEM CD) 240 MG 24 hr capsule TAKE 1 CAPSULE EVERY DAY 90 capsule 0   DROPLET PEN NEEDLES 30G X 8 MM MISC USE TWICE DAILY WITH INSULIN 200 each 3   Dulaglutide (TRULICITY) 3 MG/0.5ML SOPN Inject 3 mg as directed once a week. 6 mL 3   Elastic Bandages & Supports (MEDICAL COMPRESSION STOCKINGS) MISC 2 each by Does not apply route daily. 2 each 2   fluconazole (DIFLUCAN) 150 MG tablet Take 1 tablet (150 mg total) by mouth every other day. 3 tablet 0   fluticasone (FLONASE) 50 MCG/ACT nasal spray USE 2 SPRAYS IN EACH NOSTRIL EVERY DAY 48 g 3   fluticasone-salmeterol (WIXELA INHUB) 250-50 MCG/ACT AEPB Inhale 1 puff into the lungs in the morning and at bedtime. 60 each 11   furosemide (LASIX) 40 MG tablet Take 0.5 tablets (20 mg total) by mouth 2 (two) times daily. 180 tablet 1   Insulin Lispro Prot & Lispro (HUMALOG MIX 75/25 KWIKPEN) (75-25) 100 UNIT/ML Kwikpen Inject 12 Units into the skin 2 (two) times daily with a meal. 15 mL    irbesartan (AVAPRO) 300 MG tablet Take 1 tablet (300 mg total) by mouth daily. 90 tablet 1   loratadine (CLARITIN) 10 MG tablet TAKE 1 TABLET(10 MG) BY MOUTH DAILY 30 tablet 8   Magnesium Chloride 64 MG TBEC Take 1 tablet by mouth daily.     pravastatin (PRAVACHOL) 80 MG tablet Take 1 tablet (80 mg total) by mouth every evening. 90 tablet 1   TRUE METRIX BLOOD GLUCOSE TEST test strip CHECK BLOOD SUGAR FOUR TIMES DAILY 400 strip 3   TRUEplus Lancets 33G MISC TEST BLOOD SUGAR THREE TIMES DAILY AS NEEDED 300 each 3   TYLENOL 325 MG tablet      No current facility-administered medications for this  visit.    Allergies:   Farxiga [dapagliflozin]    Social History:  The patient  reports that she has never smoked. She has never used smokeless tobacco. She reports that she does not drink alcohol and does not use drugs.   Family History:  The patient's family history includes Heart attack in her mother; Hypertension in her mother.    ROS:  Please see the history of present illness.   Otherwise, review of systems are positive for none.   All other systems are reviewed and negative.    PHYSICAL EXAM: VS:  BP 133/76 (BP Location: Left Arm, Patient Position: Sitting, Cuff Size: Large)   Pulse 77   Ht 5\' 10"  (1.778 m)  Wt (!) 365 lb 8 oz (165.8 kg)   SpO2 99%   BMI 52.44 kg/m  , BMI Body mass index is 52.44 kg/m. GEN: Well nourished, well developed, in no acute distress  HEENT: normal  Neck: no JVD, carotid bruits, or masses Cardiac: Irregularly irregular; no rubs, or gallops.  1/ 6 systolic murmur in the aortic area.  Trace bilateral leg edema. Respiratory:  clear to auscultation bilaterally, normal work of breathing GI: soft, nontender, nondistended, + BS MS: no deformity or atrophy  Skin: warm and dry, no rash Neuro:  Strength and sensation are intact Psych: euthymic mood, full affect   EKG:  EKG is ordered today. The ekg ordered today demonstrates : Atrial fibrillation with premature ventricular or aberrantly conducted complexes Low voltage QRS     Recent Labs: 08/25/2022: ALT 7; BUN 33; Creat 1.46; Hemoglobin 10.7; Platelets 224; Potassium 4.6; Sodium 138    Lipid Panel    Component Value Date/Time   CHOL 137 08/25/2022 1003   CHOL 139 04/16/2015 1138   TRIG 102 08/25/2022 1003   HDL 48 (L) 08/25/2022 1003   HDL 52 04/16/2015 1138   CHOLHDL 2.9 08/25/2022 1003   VLDL 19 12/23/2016 0941   LDLCALC 70 08/25/2022 1003      Wt Readings from Last 3 Encounters:  05/05/23 (!) 365 lb 8 oz (165.8 kg)  01/07/23 (!) 351 lb (159.2 kg)  11/11/22 (!) 352 lb 8 oz  (159.9 kg)           No data to display            ASSESSMENT AND PLAN:  1.  Chronic atrial fibrillation:  Chads Vasc score is 5 .  Continue anticoagulation with Eliquis 5 mg twice daily.  Most recent labs showed a creatinine of 1.4 which is improved from before.  2. Chronic diastolic heart failure: She uses furosemide as needed.  Her weight has increased but she appears to be euvolemic.  3. Essential hypertension: Blood pressure is controlled on current medications.  4.  Obstructive sleep apnea: She uses CPAP on a regular basis.   Disposition:   FU with me in 6 months  Signed,  Lorine Bears, MD  05/05/2023 8:32 AM    [facility]

## 2023-05-05 NOTE — Patient Instructions (Signed)
Medication Instructions:  No changes *If you need a refill on your cardiac medications before your next appointment, please call your pharmacy*   Lab Work: None ordered If you have labs (blood work) drawn today and your tests are completely normal, you will receive your results only by: MyChart Message (if you have MyChart) OR A paper copy in the mail If you have any lab test that is abnormal or we need to change your treatment, we will call you to review the results.   Testing/Procedures: None ordered   Follow-Up: At [facility], you and your health needs are our priority.  As part of our continuing mission to provide you with exceptional heart care, we have created designated Provider Care Teams.  These Care Teams include your primary Cardiologist (physician) and Advanced Practice Providers (APPs -  Physician Assistants and Nurse Practitioners) who all work together to provide you with the care you need, when you need it.  We recommend signing up for the patient portal called "MyChart".  Sign up information is provided on this After Visit Summary.  MyChart is used to connect with patients for Virtual Visits (Telemedicine).  Patients are able to view lab/test results, encounter notes, upcoming appointments, etc.  Non-urgent messages can be sent to your provider as well.   To learn more about what you can do with MyChart, go to [email or web address].    Your next appointment:   6 month(s)  Provider:   You may see Muhammad Arida, MD or one of the following Advanced Practice Providers on your designated Care Team:   Christopher Berge, NP Ryan Dunn, PA-C Cadence Furth, PA-C Sheri Hammock, NP    

## 2023-05-09 NOTE — Progress Notes (Unsigned)
Name: Amber Reyes   MRN: [id number]    DOB: [birth year]   Date:05/09/2023       Progress Note  Subjective  Chief Complaint  Follow Up  I connected with  Lianne Bushy  on 05/09/23 at  8:40 AM EDT by a video enabled telemedicine application and verified that I am speaking with the correct person using two identifiers.  I discussed the limitations of evaluation and management by telemedicine and the availability of in person appointments. The patient expressed understanding and agreed to proceed with the virtual visit  Staff also discussed with the patient that there may be a patient responsible charge related to this service. Patient Location: *** Provider Location: *** Additional Individuals present: ***  HPI  DMII with complications: She has been compliant with medication, she is on  Humalog 75/25 ( current taking  10 units twice daily)  since  Feb 2018, also started on Trulicity in 2017 HgbA1C was 6.5%  6.7% ,6.9%, 6.8 % , 7 %, 7%  7.6 %,it went up to 7.9 % and today is 6.9 %  ( she is allergic to [place]) she resumed a diabetic diet   Taking Avapro for microalbuminuria and CKI stage III ,  she is on statin for dyslipidemia, she has diabetes associated with obesity.   HTN/CHF/Afib/Pulmonary hypertension/dilated aorta  : taking medications and denies side effects of medications, bp today is slightly up, she states she had stopped taking lasix due to cramps and bp spiked, seen by nephrologist and advised to take half lasix 40 mg twice daily and add magnesium. BP today with our cuff and her bp cuff are similar in the 140's She denies chest pain no recent palpitation, found to have Afib on EKG done by Dr. Kirke Corin and takes Cardizem for rate control   She has orthopnea - uses two pillows , SOB is stable.  Last Echo 03/2022  LV function is still normal, but still has left and right atrial dilation, pulmonary hypertension and grade II diastolic dysfunction  She takes Eliquis and has been  compliant with medication.Dr. Kirke Corin gives her refills and is trying to get her approved for assistance program   Hyperlipidemia: taking Pravastatin, we will continue current medication ,last LDL was  at goal , down to 70    CKIII: she is going to see nephrologist at [facility], last labs reviewed still stage III CKI , last GFR was 132   denies  pruritis, she has good urine output, she is not taking potassium because she had hyperkalemia - but avoiding lasix, advised to take one potassium daily when she takes lasix  , she has secondary hyperparathyroidism , reviewed labs done by nephrologist lately    OSA: she was wearing CPAP machine every night, wakes up feeling rested.Unchanged   Morbid Obesity: she gets Trulicity through [employer] she initially lost a lot of weight but now is stable,  down 2 lbs since last visit    Asthma Moderate persistent/Chronic bronchitis: . She is compliant and uses Advair twice daily as prescribed.  No wheezing , morning cough but is dry and it the mornings after removing CPAP , her SOB is back to baseline. She needs refill of albuterol   Atherosclerosis of aorta and echo showed mild dilation of ascending aorta, continue statin and eliquis and pravastatin . LDL at goal   Chronic back pain: she cannot stand or walk for a long time because increases the pain, dull pain, no radiculitis. She states  Tylenol controls symptoms . Stable   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) 02/06/2020   History of colonic polyps    Polyp of colon    Thoracic aortic atherosclerosis (HCC) 12/30/2016   Controlled type 2 diabetes  mellitus with microalbuminuria, with long-term current use of insulin (HCC) 12/23/2016   Venous insufficiency 05/21/2016   Hypertensive heart disease 05/21/2016   Anemia in chronic kidney disease 12/22/2015   Pulmonary hypertension (HCC) 12/18/2015   Xanthelasma 04/15/2015   Anxiety 04/12/2015   Chronic kidney disease (CKD), stage III (moderate) (HCC) 04/12/2015   Edema extremities 04/12/2015   Disc disorder of lumbar region 04/12/2015   Asthma, moderate persistent 04/12/2015   Morbid obesity (HCC) 04/12/2015   Obstructive apnea 04/12/2015   Restless leg 04/12/2015   Allergic rhinitis 04/12/2015   Vitamin D deficiency 04/12/2015   Controlled gout 04/12/2015   Premature atrial contractions 11/29/2013   Atrial fibrillation (HCC) 11/09/2013   Benign essential hypertension    Hyperlipidemia     Past Surgical History:  Procedure Laterality Date   CARDIAC CATHETERIZATION  2002   [facility]   CATARACT EXTRACTION W/PHACO Right 09/15/2021   Procedure: CATARACT EXTRACTION PHACO AND INTRAOCULAR LENS PLACEMENT (IOC) RIGHT 10.19 01:01.8;  Surgeon: Galen Manila, MD;  Location: [facility];  Service: Ophthalmology;  Laterality: Right;   CATARACT EXTRACTION W/PHACO Left 09/29/2021   Procedure: CATARACT EXTRACTION PHACO AND INTRAOCULAR LENS PLACEMENT (IOC) LEFT DIABETIC 11.40 01:21.2;  Surgeon: Galen Manila, MD;  Location: [facility];  Service: Ophthalmology;  Laterality: Left;   COLONOSCOPY WITH PROPOFOL N/A 09/25/2018   Procedure: COLONOSCOPY WITH PROPOFOL;  Surgeon: Wyline Mood, MD;  Location: [facility] ENDOSCOPY;  Service: Gastroenterology;  Laterality: N/A;   COLONOSCOPY WITH PROPOFOL N/A 04/13/2019   Procedure: COLONOSCOPY WITH PROPOFOL;  Surgeon: Wyline Mood, MD;  Location: [facility] ENDOSCOPY;  Service: Gastroenterology;  Laterality: N/A;   COLONOSCOPY WITH PROPOFOL N/A 06/27/2020   Procedure: COLONOSCOPY WITH PROPOFOL;  Surgeon: Wyline Mood, MD;  Location: [facility] ENDOSCOPY;  Service:  Gastroenterology;  Laterality: N/A;   PERICARDIUM SURGERY      Family History  Problem Relation Age of Onset   Heart attack Mother    Hypertension Mother    Breast cancer Neg Hx     Social History   Socioeconomic History   Marital status: Married    Spouse name: Not on file   Number of children: 3   Years of education: Not on file   Highest education level: Associate degree: academic program  Occupational History   Occupation: retired  Tobacco Use   Smoking status: Never   Smokeless tobacco: Never   Tobacco comments:    n/a  Vaping Use   Vaping status: Never Used  Substance and Sexual Activity   Alcohol use: No    Alcohol/week: 0.0 standard drinks of alcohol   Drug use: No   Sexual activity: Not Currently  Other Topics Concern   Not on file  Social History Narrative   Not on file   Social Determinants of Health   Financial Resource Strain: Low Risk  (06/24/2022)   Overall Financial Resource Strain (CARDIA)    Difficulty  of Paying Living Expenses: Not hard at all  Food Insecurity: No Food Insecurity (06/24/2022)   Hunger Vital Sign    Worried About Running Out of Food in the Last Year: Never true    Ran Out of Food in the Last Year: Never true  Transportation Needs: No Transportation Needs (06/24/2022)   PRAPARE - Administrator, Civil Service (Medical): No    Lack of Transportation (Non-Medical): No  Physical Activity: Inactive (06/24/2022)   Exercise Vital Sign    Days of Exercise per Week: 0 days    Minutes of Exercise per Session: 0 min  Stress: No Stress Concern Present (06/24/2022)   [employer] of Occupational Health - Occupational Stress Questionnaire    Feeling of Stress : Not at all  Social Connections: Moderately Integrated (06/24/2022)   Social Connection and Isolation Panel [NHANES]    Frequency of Communication with Friends and Family: More than three times a week    Frequency of Social Gatherings with Friends and Family: Once a  week    Attends Religious Services: More than 4 times per year    Active Member of [employer] or Organizations: No    Attends Banker Meetings: Never    Marital Status: Married  Catering manager Violence: Not At Risk (06/24/2022)   Humiliation, Afraid, Rape, and Kick questionnaire    Fear of Current or Ex-Partner: No    Emotionally Abused: No    Physically Abused: No    Sexually Abused: No     Current Outpatient Medications:    acetaminophen (TYLENOL) 650 MG CR tablet, Take 650 mg by mouth in the morning and at bedtime., Disp: , Rfl:    albuterol (VENTOLIN HFA) 108 (90 Base) MCG/ACT inhaler, Inhale 2 puffs into the lungs every 6 (six) hours as needed for wheezing or shortness of breath., Disp: 18 g, Rfl: 0   Alcohol Swabs (DROPSAFE ALCOHOL PREP) 70 % PADS, USE AS DIRECTED, Disp: 300 each, Rfl: 3   allopurinol (ZYLOPRIM) 100 MG tablet, Take 1 tablet (100 mg total) by mouth 2 (two) times daily., Disp: 180 tablet, Rfl: 1   apixaban (ELIQUIS) 5 MG TABS tablet, Take 1 tablet (5 mg total) by mouth 2 (two) times daily., Disp: 7 tablet, Rfl: 0   Blood Glucose Calibration (TRUE METRIX LEVEL 1) Low SOLN, 1 each by In Vitro route once a week., Disp: 1 each, Rfl: 1   Blood Glucose Monitoring Suppl (TRUE METRIX AIR GLUCOSE METER) w/Device KIT, Inject 1 each into the skin 4 (four) times daily as needed. Check fsbs 4 times daily E11.22, Disp: 1 kit, Rfl: 0   Cholecalciferol (VITAMIN D) 2000 UNITS tablet, Take 2,000 Units by mouth daily., Disp: , Rfl:    colchicine (COLCRYS) 0.6 MG tablet, Take 1-2 tablets (0.6-1.2 mg total) by mouth daily as needed., Disp: 30 tablet, Rfl: 0   diltiazem (CARDIZEM CD) 240 MG 24 hr capsule, TAKE 1 CAPSULE EVERY DAY, Disp: 90 capsule, Rfl: 0   DROPLET PEN NEEDLES 30G X 8 MM MISC, USE TWICE DAILY WITH INSULIN, Disp: 200 each, Rfl: 3   Dulaglutide (TRULICITY) 3 MG/0.5ML SOPN, Inject 3 mg as directed once a week., Disp: 6 mL, Rfl: 3   Elastic Bandages & Supports (MEDICAL  COMPRESSION STOCKINGS) MISC, 2 each by Does not apply route daily., Disp: 2 each, Rfl: 2   fluconazole (DIFLUCAN) 150 MG tablet, Take 1 tablet (150 mg total) by mouth every other day., Disp: 3 tablet, Rfl: 0  fluticasone (FLONASE) 50 MCG/ACT nasal spray, USE 2 SPRAYS IN EACH NOSTRIL EVERY DAY, Disp: 48 g, Rfl: 3   fluticasone-salmeterol (WIXELA INHUB) 250-50 MCG/ACT AEPB, Inhale 1 puff into the lungs in the morning and at bedtime., Disp: 60 each, Rfl: 11   furosemide (LASIX) 40 MG tablet, Take 0.5 tablets (20 mg total) by mouth 2 (two) times daily., Disp: 180 tablet, Rfl: 1   Insulin Lispro Prot & Lispro (HUMALOG MIX 75/25 KWIKPEN) (75-25) 100 UNIT/ML Kwikpen, Inject 12 Units into the skin 2 (two) times daily with a meal., Disp: 15 mL, Rfl:    irbesartan (AVAPRO) 300 MG tablet, Take 1 tablet (300 mg total) by mouth daily., Disp: 90 tablet, Rfl: 1   loratadine (CLARITIN) 10 MG tablet, TAKE 1 TABLET(10 MG) BY MOUTH DAILY, Disp: 30 tablet, Rfl: 8   Magnesium Chloride 64 MG TBEC, Take 1 tablet by mouth daily., Disp: , Rfl:    pravastatin (PRAVACHOL) 80 MG tablet, Take 1 tablet (80 mg total) by mouth every evening., Disp: 90 tablet, Rfl: 1   TRUE METRIX BLOOD GLUCOSE TEST test strip, CHECK BLOOD SUGAR FOUR TIMES DAILY, Disp: 400 strip, Rfl: 3   TRUEplus Lancets 33G MISC, TEST BLOOD SUGAR THREE TIMES DAILY AS NEEDED, Disp: 300 each, Rfl: 3   TYLENOL 325 MG tablet, , Disp: , Rfl:   Allergies  Allergen Reactions   Farxiga [Dapagliflozin] Hives    I personally reviewed active problem list, medication list, allergies, family history, social history, health maintenance with the patient/caregiver today.   ROS ***  Objective  Virtual encounter, vitals not obtained.  There is no height or weight on file to calculate BMI.  Physical Exam ***  No results found for this or any previous visit (from the past 72 hour(s)).  PHQ2/9:    01/07/2023    8:02 AM 08/25/2022    9:17 AM 06/24/2022    8:01 AM  03/16/2022    8:05 AM 02/25/2022    1:49 PM  Depression screen PHQ 2/9  Decreased Interest 0 0 0 0 0  Down, Depressed, Hopeless 0 0 0 0 0  PHQ - 2 Score 0 0 0 0 0  Altered sleeping 0 0  0 0  Tired, decreased energy 0 0  0 0  Change in appetite 0 0  0 0  Feeling bad or failure about yourself  0 0  0 0  Trouble concentrating 0 0  0 0  Moving slowly or fidgety/restless 0 0  0 0  Suicidal thoughts 0 0  0 0  PHQ-9 Score 0 0  0 0  Difficult doing work/chores     Not difficult at all   PHQ-2/9 Result is {gen negative/positive:[id number]}.    Fall Risk:    01/07/2023    8:01 AM 08/25/2022    9:17 AM 06/24/2022    8:04 AM 03/16/2022    8:05 AM 02/25/2022    1:49 PM  Fall Risk   Falls in the past year? 0 0 0 0 0  Number falls in past yr: 0 0  0 0  Injury with Fall? 0 0  0 0  Risk for fall due to : No Fall Risks No Fall Risks No Fall Risks No Fall Risks   Follow up Falls prevention discussed Falls prevention discussed Education provided;Falls prevention discussed Falls prevention discussed      Assessment & Plan  There are no diagnoses linked to this encounter.  I discussed the assessment and treatment plan with  the patient. The patient was provided an opportunity to ask questions and all were answered. The patient agreed with the plan and demonstrated an understanding of the instructions.  The patient was advised to call back or seek an in-person evaluation if the symptoms worsen or if the condition fails to improve as anticipated.  I provided *** minutes of non-face-to-face time during this encounter.

## 2023-05-10 ENCOUNTER — Encounter (INDEPENDENT_AMBULATORY_CARE_PROVIDER_SITE_OTHER): Payer: Medicare PPO | Admitting: Family Medicine

## 2023-05-13 DIAGNOSIS — H43813 Vitreous degeneration, bilateral: Secondary | ICD-10-CM | POA: Diagnosis not present

## 2023-05-13 LAB — HM DIABETES EYE EXAM

## 2023-05-18 ENCOUNTER — Other Ambulatory Visit: Payer: Self-pay | Admitting: Family Medicine

## 2023-05-18 DIAGNOSIS — E1122 Type 2 diabetes mellitus with diabetic chronic kidney disease: Secondary | ICD-10-CM

## 2023-06-03 ENCOUNTER — Other Ambulatory Visit: Payer: Self-pay | Admitting: Family Medicine

## 2023-06-03 DIAGNOSIS — E1122 Type 2 diabetes mellitus with diabetic chronic kidney disease: Secondary | ICD-10-CM

## 2023-06-24 ENCOUNTER — Encounter: Payer: Self-pay | Admitting: Family Medicine

## 2023-06-24 ENCOUNTER — Ambulatory Visit (INDEPENDENT_AMBULATORY_CARE_PROVIDER_SITE_OTHER): Payer: Medicare PPO | Admitting: Family Medicine

## 2023-06-24 VITALS — BP 128/78 | HR 98 | Temp 97.8°F | Resp 20 | Ht 70.0 in | Wt 368.7 lb

## 2023-06-24 DIAGNOSIS — Z23 Encounter for immunization: Secondary | ICD-10-CM

## 2023-06-24 DIAGNOSIS — Z794 Long term (current) use of insulin: Secondary | ICD-10-CM | POA: Diagnosis not present

## 2023-06-24 DIAGNOSIS — Z860100 Personal history of colon polyps, unspecified: Secondary | ICD-10-CM

## 2023-06-24 DIAGNOSIS — E1169 Type 2 diabetes mellitus with other specified complication: Secondary | ICD-10-CM | POA: Diagnosis not present

## 2023-06-24 DIAGNOSIS — I77819 Aortic ectasia, unspecified site: Secondary | ICD-10-CM | POA: Diagnosis not present

## 2023-06-24 DIAGNOSIS — E1129 Type 2 diabetes mellitus with other diabetic kidney complication: Secondary | ICD-10-CM

## 2023-06-24 DIAGNOSIS — I482 Chronic atrial fibrillation, unspecified: Secondary | ICD-10-CM

## 2023-06-24 DIAGNOSIS — M109 Gout, unspecified: Secondary | ICD-10-CM

## 2023-06-24 DIAGNOSIS — I110 Hypertensive heart disease with heart failure: Secondary | ICD-10-CM | POA: Diagnosis not present

## 2023-06-24 DIAGNOSIS — I700 Atherosclerosis of aorta: Secondary | ICD-10-CM | POA: Diagnosis not present

## 2023-06-24 DIAGNOSIS — Z1211 Encounter for screening for malignant neoplasm of colon: Secondary | ICD-10-CM

## 2023-06-24 DIAGNOSIS — N1832 Chronic kidney disease, stage 3b: Secondary | ICD-10-CM | POA: Diagnosis not present

## 2023-06-24 DIAGNOSIS — N2581 Secondary hyperparathyroidism of renal origin: Secondary | ICD-10-CM

## 2023-06-24 DIAGNOSIS — J4540 Moderate persistent asthma, uncomplicated: Secondary | ICD-10-CM

## 2023-06-24 LAB — POCT GLYCOSYLATED HEMOGLOBIN (HGB A1C): Hemoglobin A1C: 7.2 % — AB (ref 4.0–5.6)

## 2023-06-24 MED ORDER — LORATADINE 10 MG PO TABS: 10.0000 mg | ORAL_TABLET | Freq: Every day | ORAL | 1 refills | Status: DC

## 2023-06-24 MED ORDER — ALBUTEROL SULFATE HFA 108 (90 BASE) MCG/ACT IN AERS: 2.0000 | INHALATION_SPRAY | Freq: Four times a day (QID) | RESPIRATORY_TRACT | 0 refills | Status: DC | PRN

## 2023-06-24 MED ORDER — FLUTICASONE-SALMETEROL 250-50 MCG/ACT IN AEPB: 1.0000 | INHALATION_SPRAY | Freq: Two times a day (BID) | RESPIRATORY_TRACT | 1 refills | Status: DC

## 2023-06-25 ENCOUNTER — Other Ambulatory Visit: Payer: Self-pay | Admitting: Cardiovascular Disease

## 2023-06-25 ENCOUNTER — Other Ambulatory Visit: Payer: Self-pay | Admitting: Family Medicine

## 2023-06-25 DIAGNOSIS — I10 Essential (primary) hypertension: Secondary | ICD-10-CM

## 2023-06-25 DIAGNOSIS — E1129 Type 2 diabetes mellitus with other diabetic kidney complication: Secondary | ICD-10-CM

## 2023-06-27 ENCOUNTER — Telehealth: Payer: Self-pay

## 2023-06-27 ENCOUNTER — Other Ambulatory Visit: Payer: Self-pay

## 2023-06-27 ENCOUNTER — Telehealth: Payer: Self-pay | Admitting: *Deleted

## 2023-06-27 DIAGNOSIS — Z860100 Personal history of colon polyps, unspecified: Secondary | ICD-10-CM

## 2023-06-27 MED ORDER — NA SULFATE-K SULFATE-MG SULF 17.5-3.13-1.6 GM/177ML PO SOLN: 1.0000 | Freq: Once | ORAL | 0 refills | Status: AC

## 2023-06-27 NOTE — Telephone Encounter (Signed)
Gastroenterology Pre-Procedure Review  Request Date: 07/24/23 Requesting Physician: Dr. Tobi Bastos  PATIENT REVIEW QUESTIONS: The patient responded to the following health history questions as indicated:    1. Are you having any GI issues? no 2. 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 Tobi Bastos 06/27/20 recommended repeat in 3 years) 3. Do you have a family history of Colon Cancer or Polyps? no 4. Diabetes Mellitus? yes (has been advised to sto Trulicity 7 days prior to colonoscopy and take half of the usual amount of humalog the day befored) 5. Joint replacements in the past 12 months?no 6. Major health problems in the past 3 months?no 7. Any artificial heart valves, MVP, or defibrillator?no 8. Cardiac history? Yeas cardiac clearance sent to [facility] ([facility])    MEDICATIONS & ALLERGIES:    Patient reports the following regarding taking any anticoagulation/antiplatelet therapy:   Plavix, Coumadin, Eliquis, Xarelto, Lovenox, Pradaxa, Brilinta, or Effient? yes (eliquis blood thinner advice sent to CV Preop team) Aspirin? no  Patient confirms/reports the following medications:  Current Outpatient Medications  Medication Sig Dispense Refill   acetaminophen (TYLENOL) 650 MG CR tablet Take 650 mg by mouth in the morning and at bedtime.     albuterol (VENTOLIN HFA) 108 (90 Base) MCG/ACT inhaler Inhale 2 puffs into the lungs every 6 (six) hours as needed for wheezing or shortness of breath. 17 each 0   Alcohol Swabs (DROPSAFE ALCOHOL PREP) 70 % PADS USE AS DIRECTED 300 each 3   allopurinol (ZYLOPRIM) 100 MG tablet Take 1 tablet (100 mg total) by mouth 2 (two) times daily. 180 tablet 1   apixaban (ELIQUIS) 5 MG TABS tablet Take 1 tablet (5 mg total) by mouth 2 (two) times daily. 7 tablet 0   Blood Glucose Calibration (TRUE METRIX LEVEL 1) Low SOLN 1 each by In Vitro route once a week. 1 each 1   Blood Glucose Monitoring Suppl (TRUE METRIX AIR GLUCOSE  METER) w/Device KIT Inject 1 each into the skin 4 (four) times daily as needed. Check fsbs 4 times daily E11.22 1 kit 0   Cholecalciferol (VITAMIN D) 2000 UNITS tablet Take 2,000 Units by mouth daily.     colchicine (COLCRYS) 0.6 MG tablet Take 1-2 tablets (0.6-1.2 mg total) by mouth daily as needed. 30 tablet 0   diltiazem (CARDIZEM CD) 240 MG 24 hr capsule TAKE 1 CAPSULE EVERY DAY 90 capsule 3   Dulaglutide (TRULICITY) 3 MG/0.5ML SOPN Inject 3 mg as directed once a week. 6 mL 3   Elastic Bandages & Supports (MEDICAL COMPRESSION STOCKINGS) MISC 2 each by Does not apply route daily. 2 each 2   fluconazole (DIFLUCAN) 150 MG tablet Take 1 tablet (150 mg total) by mouth every other day. 3 tablet 0   fluticasone (FLONASE) 50 MCG/ACT nasal spray USE 2 SPRAYS IN EACH NOSTRIL EVERY DAY 48 g 3   fluticasone-salmeterol (WIXELA INHUB) 250-50 MCG/ACT AEPB Inhale 1 puff into the lungs in the morning and at bedtime. 180 each 1   furosemide (LASIX) 40 MG tablet Take 0.5 tablets (20 mg total) by mouth 2 (two) times daily. 180 tablet 1   HUMALOG MIX 75/25 KWIKPEN (75-25) 100 UNIT/ML KwikPen DIAL AND INJECT 10 UNITS UNDER THE SKIN TWICE DAILY WITH A MEAL. MAX DAILY DOSE 20 UNITS. DISCARD USED PEN AFTER 10 DAYS. 45 mL 0   Insulin Pen Needle (DROPLET PEN NEEDLES) 30G X 8 MM MISC USE TWICE DAILY WITH INSULIN 200 each  0   irbesartan (AVAPRO) 300 MG tablet Take 1 tablet (300 mg total) by mouth daily. 90 tablet 1   loratadine (CLARITIN) 10 MG tablet Take 1 tablet (10 mg total) by mouth daily. 100 tablet 1   Magnesium Chloride 64 MG TBEC Take 1 tablet by mouth daily.     pravastatin (PRAVACHOL) 80 MG tablet Take 1 tablet (80 mg total) by mouth every evening. 90 tablet 1   TRUE METRIX BLOOD GLUCOSE TEST test strip CHECK BLOOD SUGAR FOUR TIMES DAILY 400 strip 3   TRUEplus Lancets 33G MISC TEST BLOOD SUGAR THREE TIMES DAILY AS NEEDED 300 each 3   TYLENOL 325 MG tablet      No current facility-administered medications for  this visit.    Patient confirms/reports the following allergies:  Allergies  Allergen Reactions   Farxiga [Dapagliflozin] Hives    No orders of the defined types were placed in this encounter.   AUTHORIZATION INFORMATION Primary Insurance: 1D#: Group #:  Secondary Insurance: 1D#: Group #:  SCHEDULE INFORMATION: Date: 08/02/23 Time: Location: [facility]

## 2023-06-27 NOTE — Telephone Encounter (Signed)
Dr. Kirke Corin,   You saw this patient on 05/05/2023. Per office protocol, will you please comment on medical clearance for colonoscopy on 12/10?  Please route your response to P [facility] Pre[facility]. I will communicate with requesting office once you have given recommendations.   Thank you!  Carlos Levering, NP

## 2023-06-27 NOTE — Telephone Encounter (Signed)
Requested Prescriptions  Pending Prescriptions Disp Refills   Insulin Pen Needle (DROPLET PEN NEEDLES) 30G X 8 MM MISC [Pharmacy Med Name: Droplet Pen Needles Miscellaneous 30G X 8 MM] 200 each 0    Sig: USE TWICE DAILY WITH INSULIN     Endocrinology: Diabetes - Testing Supplies Passed - 06/25/2023  2:06 AM      Passed - Valid encounter within last 12 months    Recent Outpatient Visits           3 days ago Controlled type 2 diabetes mellitus with microalbuminuria, with long-term current use of insulin [facility])   [place] [facility] [place], Danna Hefty, MD   5 months ago Controlled type 2 diabetes mellitus with microalbuminuria, with long-term current use of insulin [facility])   [facility] Alba Cory, MD   10 months ago Controlled type 2 diabetes mellitus with stage 3 chronic kidney disease, with long-term current use of insulin [facility])   [place] [facility] [employer], Danna Hefty, MD   1 year ago Controlled type 2 diabetes mellitus with stage 3 chronic kidney disease, with long-term current use of insulin [facility])   [place] [facility] Alba Cory, MD   1 year ago Shortness of breath   [facility] Margarita Mail, DO       Future Appointments             In 1 week  [facility]   In 4 months Alba Cory, MD [facility], [facility]   In 4 months Kirke Corin, Chelsea Aus, MD [facility]

## 2023-06-27 NOTE — Telephone Encounter (Signed)
Please advise holding Eliquis prior to colonoscopy on 12/10.   Thank you!  DW

## 2023-06-27 NOTE — Telephone Encounter (Signed)
   Pre-operative Risk Assessment    Patient Name: Amber Reyes  DOB: Aug 29, 1945 MRN: [id number]      Request for Surgical Clearance    Procedure:   Colonoscopy  Date of Surgery:  Clearance 08/02/23                                 Surgeon:  Dr. Katy Apo Group or Practice Name:  [facility] Phone number:  [phone] Fax number:  [phone]   Type of Clearance Requested:   - Medical  - Pharmacy:  Hold Apixaban (Eliquis) Not Indicated.   Type of Anesthesia:  General    Additional requests/questions:   Last OV: Dr. Kirke Corin 05/05/2023 Upcoming OV: Dr. Kirke Corin 11/03/2023  Signed, Emmit Pomfret   06/27/2023, 11:18 AM

## 2023-06-28 ENCOUNTER — Ambulatory Visit: Payer: Medicare PPO

## 2023-06-29 NOTE — Telephone Encounter (Signed)
Patient with diagnosis of afib on Eliquis for anticoagulation.    Procedure:  colonoscopy  Date of procedure: 08/02/2023   CHA2DS2-VASc Score = 6   This indicates a 9.7% annual risk of stroke. The patient's score is based upon: CHF History: 1 HTN History: 1 Diabetes History: 1 Stroke History: 0 Vascular Disease History: 0 Age Score: 2 Gender Score: 1     CrCl 58 mL/min Platelet count 224 (08/2022)    Per office protocol, patient can hold Eliquis for 1 days prior to procedure.     **This guidance is not considered finalized until pre-operative APP has relayed final recommendations.**

## 2023-06-29 NOTE — Telephone Encounter (Signed)
She is at low risk from a cardiac standpoint.  

## 2023-06-29 NOTE — Telephone Encounter (Signed)
   Patient Name: Amber Reyes  DOB: 1946/04/22 MRN: [id number]  Primary Cardiologist: Lorine Bears, MD  Chart reviewed as part of pre-operative protocol coverage. Given past medical history and time since last visit, based on ACC/AHA guidelines, Amber Reyes is at acceptable risk for the planned procedure without further cardiovascular testing.   The patient was advised that if she develops new symptoms prior to surgery to contact our office to arrange for a follow-up visit, and she verbalized understanding.  Per office protocol, patient can hold Eliquis for 1 days prior to procedure.   I will route this recommendation to the requesting party via Epic fax function and remove from pre-op pool.  Please call with questions.  Napoleon Form, Leodis Rains, NP 06/29/2023, 4:05 PM

## 2023-06-30 ENCOUNTER — Other Ambulatory Visit: Payer: Self-pay | Admitting: Pharmacist

## 2023-06-30 ENCOUNTER — Telehealth: Payer: Self-pay

## 2023-06-30 NOTE — Progress Notes (Signed)
06/30/2023 Name: Amber Reyes MRN: [id number] DOB: 11/17/1945  Chief Complaint  Patient presents with   Medication Assistance    Amber Reyes is a 77 y.o. year old female who presented for a telephone visit.   They were referred to the pharmacist for assistance in managing medication access.    Subjective:  Care Team: Primary Care Provider: Alba Cory, MD ; Next Scheduled Visit: 10/25/2023 Cardiologist: Iran Ouch, MD; Next Scheduled Visit: 11/03/2023 Nephrologist: Rolm Baptise, MD; Next Scheduled Visit: 10/18/2023  Medication Access/Adherence  Current Pharmacy:  [id number] - [place], [place] - [address] AT [facility] [address] [place] [address] Phone: [phone] Fax: [phone]  [facility] - [place], [place] - [address] [place] [address] Phone: [phone] Fax: [phone]  [facility] - [place], [place] - [address] [place] [address] Phone: [phone] Fax: [phone]  [facility] - [address] [place] [address] Phone: [phone] Fax: [phone]  [facility] - [place], [place] - [address] [place] [address] Phone: [phone] Fax: [phone]   Patient reports affordability concerns with their medications: No  Patient reports access/transportation concerns to their pharmacy: No  Patient reports adherence concerns with their medications:  No    From review of dispensing history in chart, appears pravastatin 80 mg prescription last filled 01/10/2023 for 90 day supply - Patient states she thinks that she recently received a new supply from [facility] mail order, but will follow up with pharmacy to confirm  Diabetes:  Current medications:  - Trulicity 3 mg weekly - Humalog  Mix 75/25 Kwikpen - 12 units twice daily before breakfast and supper  Current glucose readings: morning fasting readings ranging: 110-120s; today: 116  Patient denies hypoglycemic s/sx including dizziness, shakiness, sweating. Patient   Statin therapy: pravastatin 80 mg daily  Current medication access support: enrolled in patient assistance for Trulicity and Humalog Mix from [employer] patient assistance through 08/23/2023   Hypertension/Atrial Fibrillation/CHF:  Current medications:  Rate Control: diltiazem CD 240 mg daily Rhythm Control:  Anticoagulation Regimen: Eliquis 5 mg twice daily ACEi/ARB/ARNI: irbesartan 300 mg daily Diuretic: furosemide 40 mg - 1/2 tablet (20 mg) twice daily  Patient has an automated, upper arm home BP cuff Current blood pressure readings readings: last checked 11/2, reading: 134/65, HR 64   Patient denies hypotensive s/sx including dizziness, lightheadedness.   Current medication access support: Enrolled in patient assistance for Eliquis from [employer] through 08/23/2023   Asthma/COPD/Allergic Rhinitis:  Current medications: - Wixela inhaler 250-50 mcg/act - 1 puff twice daily - albuterol HFA inhaler - 2 puffs every 6 hours as needed for wheezing/shortness of breath - fluticasone nasal spray - 2 sprays in each nostril daily - loratadine 10 mg daily  Confirms rinses and spits out after each use of Wixela inhaler    Objective:  Lab Results  Component Value Date   HGBA1C 7.2 (A) 06/24/2023    Lab Results  Component Value Date   CREATININE 1.46 (H) 08/25/2022   BUN 33 (H) 08/25/2022   NA 138 08/25/2022   K 4.6 08/25/2022   CL 103 08/25/2022   CO2 27 08/25/2022    Lab Results  Component Value Date   CHOL 137 08/25/2022   HDL 48 (  L) 08/25/2022   LDLCALC 70 08/25/2022   TRIG 102 08/25/2022   CHOLHDL 2.9 08/25/2022   BP Readings from Last 3 Encounters:  06/24/23 128/78  05/05/23 133/76  01/07/23 (!) 142/68   Pulse Readings from Last 3  Encounters:  06/24/23 98  05/05/23 77  01/07/23 91     Medications Reviewed Today     Reviewed by Manuela Neptune, RPH-CPP (Pharmacist) on 06/30/23 at 0904  Med List Status: <None>   Medication Order Taking? Sig Documenting Provider Last Dose Status Informant  acetaminophen (TYLENOL) 650 MG CR tablet [id number] Yes Take 650 mg by mouth in the morning and at bedtime. [provider] Taking Active   albuterol (VENTOLIN HFA) 108 (90 Base) MCG/ACT inhaler [id number] Yes Inhale 2 puffs into the lungs every 6 (six) hours as needed for wheezing or shortness of breath. Alba Cory, MD Taking Active   Alcohol Swabs (DROPSAFE ALCOHOL PREP) 70 % PADS [id number]  USE AS DIRECTED Alba Cory, MD  Active   allopurinol (ZYLOPRIM) 100 MG tablet [id number] Yes Take 1 tablet (100 mg total) by mouth 2 (two) times daily. Alba Cory, MD Taking Active   apixaban (ELIQUIS) 5 MG TABS tablet [id number] Yes Take 1 tablet (5 mg total) by mouth 2 (two) times daily. Iran Ouch, MD Taking Active   Blood Glucose Calibration (TRUE METRIX LEVEL 1) Low SOLN [id number]  1 each by In Vitro route once a week. Alba Cory, MD  Active   Blood Glucose Monitoring Suppl (TRUE METRIX AIR GLUCOSE METER) w/Device KIT [id number]  Inject 1 each into the skin 4 (four) times daily as needed. Check fsbs 4 times daily E11.22 Alba Cory, MD  Active   Cholecalciferol (VITAMIN D) 2000 UNITS tablet [id number] Yes Take 2,000 Units by mouth daily. [provider] Taking Active Self  colchicine (COLCRYS) 0.6 MG tablet [id number]  Take 1-2 tablets (0.6-1.2 mg total) by mouth daily as needed. Alba Cory, MD  Active   diltiazem [facility] CD) 240 MG 24 hr capsule [id number] Yes TAKE 1 CAPSULE EVERY DAY Iran Ouch, MD Taking Active   Dulaglutide (TRULICITY) 3 MG/0.5ML SOPN [id number] Yes Inject 3 mg as directed once a week. Alba Cory, MD Taking Active   Elastic Bandages & Supports (MEDICAL  COMPRESSION STOCKINGS) [place] [id number]  2 each by Does not apply route daily. Alba Cory, MD  Active Self  Ferrous Sulfate (IRON SUPPLEMENT PO) [id number] Yes Take by mouth. [provider]  Active   fluticasone (FLONASE) 50 MCG/ACT nasal spray [id number] Yes USE 2 SPRAYS IN EACH NOSTRIL EVERY DAY Alba Cory, MD Taking Active   fluticasone-salmeterol [facility] INHUB) 250-50 MCG/ACT AEPB [id number] Yes Inhale 1 puff into the lungs in the morning and at bedtime. Alba Cory, MD Taking Active   furosemide (LASIX) 40 MG tablet [id number] Yes Take 0.5 tablets (20 mg total) by mouth 2 (two) times daily. Alba Cory, MD Taking Active   HUMALOG MIX 75/25 KWIKPEN (75-25) 100 UNIT/ML KwikPen [id number] Yes DIAL AND INJECT 10 UNITS UNDER THE SKIN TWICE DAILY WITH A MEAL. MAX DAILY DOSE 20 UNITS. DISCARD USED PEN AFTER 10 DAYS. Alba Cory, MD Taking Active            Med Note Ronney Asters, [facility] A   Thu Jun 30, 2023  9:03 AM) Reports injecting 12 units twice daily with a meal  Insulin Pen Needle (DROPLET PEN NEEDLES) 30G X 8 MM MISC [id number]  USE TWICE DAILY WITH INSULIN Alba Cory, MD  Active  irbesartan (AVAPRO) 300 MG tablet [id number] Yes Take 1 tablet (300 mg total) by mouth daily. Alba Cory, MD Taking Active   loratadine (CLARITIN) 10 MG tablet [id number] Yes Take 1 tablet (10 mg total) by mouth daily. Alba Cory, MD Taking Active   Magnesium Chloride 64 MG TBEC [id number] Yes Take 1 tablet by mouth daily. [provider] Taking Active   pravastatin (PRAVACHOL) 80 MG tablet [id number] Yes Take 1 tablet (80 mg total) by mouth every evening. Alba Cory, MD Taking Active   TRUE METRIX BLOOD GLUCOSE TEST test strip [id number]  CHECK BLOOD SUGAR FOUR TIMES DAILY Alba Cory, MD  Active   TRUEplus Lancets 33G MISC [id number]  TEST BLOOD SUGAR THREE TIMES DAILY AS NEEDED Alba Cory, MD  Active               Assessment/Plan:   Discuss importance of  medication adherence, particularly related to pravastatin. Patient to follow up with [facility] Mail order regarding last filled date for this medication/review current supply at home and order refill if needed  Diabetes: - Reviewed goal A1c, goal fasting, and goal 2 hour post prandial glucose - Recommend to check glucose, keep log of results and have this record to review at upcoming medical appointments. Patient to contact provider office sooner if needed for readings outside of established parameters or symptoms - Will collaborate with PCP to support patient with re-enrollment in patient assistance for Trulicity and Humalog Mix from [place] for 2025 calendar year  Assist patient with starting assistance program application online Follow up with PCP to request provider complete provider portion via secure web address from [employer]. Receive confirmation from [facility] that this is completed  Hypertension/Atrial Fibrillation/CHF: - Reviewed long term cardiovascular and renal outcomes of uncontrolled blood pressure - Reviewed appropriate blood pressure monitoring technique and reviewed goal blood pressure.  - Recommend to monitor home blood pressure, keep log of results and have this record to review at upcoming medical appointments. Patient to contact provider office sooner if needed for readings outside of established parameters or symptoms  Asthma/COPD/Allergic Rhinitis: - Reviewed appropriate inhaler technique. - Provide patient with contact information for [facility] COPD Inhaler program ([phone]) for patient to contact to confirm enrolled  Follow Up Plan: Clinical Pharmacist will follow up with patient by telephone on 09/07/2023 at 9:30 AM   Estelle Grumbles, PharmD, [facility] [phone]

## 2023-06-30 NOTE — Telephone Encounter (Signed)
Patient has been advised per cardiology encounter note dated 06/27/23, " Per office protocol, patient can hold Eliquis for 1 days prior to procedures".  She said last time she was told to stop it 2 days before but Dr. Tobi Bastos wanted her to stop it 3 days so she will stop it 3 days before her procedure instead on 1 day in case Dr. Tobi Bastos needs to remove a polyp.  Thanks, [place], [place]

## 2023-07-01 ENCOUNTER — Encounter: Payer: Self-pay | Admitting: Pharmacist

## 2023-07-01 NOTE — Patient Instructions (Signed)
Goals Addressed             This Visit's Progress    Pharmacy Goals       Please follow up with [employer] Patient Assistance Program (Phone: [phone]) regarding the re-enrollment application started online on 06/30/2023.   Please feel free to give me a call if needed for any medication related questions or concerns.   If needed, I can ask my pharmacy technician to mail you a paper copy of the [employer] patient assistance application. Please let me know!   Thank you!   Estelle Grumbles, PharmD, [facility] [phone]

## 2023-07-07 ENCOUNTER — Other Ambulatory Visit: Payer: Self-pay | Admitting: Pharmacist

## 2023-07-07 ENCOUNTER — Ambulatory Visit: Payer: Medicare PPO

## 2023-07-07 DIAGNOSIS — Z0000 Encounter for general adult medical examination without abnormal findings: Secondary | ICD-10-CM | POA: Diagnosis not present

## 2023-07-07 NOTE — Progress Notes (Signed)
Subjective:   Amber Reyes is a 77 y.o. female who presents for Medicare Annual (Subsequent) preventive examination.  Visit Complete: Virtual I connected with  Lianne Bushy on 07/07/23 by a audio enabled telemedicine application and verified that I am speaking with the correct person using two identifiers.  Patient Location: Home  Provider Location: Office/Clinic  I discussed the limitations of evaluation and management by telemedicine. The patient expressed understanding and agreed to proceed.  Vital Signs: Because this visit was a virtual/telehealth visit, some criteria may be missing or patient reported. Any vitals not documented were not able to be obtained and vitals that have been documented are patient reported.       Objective:    Today's Vitals   07/07/23 1525  PainSc: 0-No pain   There is no height or weight on file to calculate BMI.     07/07/2023    3:30 PM 06/24/2022    8:04 AM 09/29/2021    6:38 AM 06/23/2021    8:25 AM 06/27/2020    8:16 AM 06/19/2020    8:26 AM 06/15/2019    8:24 AM  Advanced Directives  Does Patient Have a Medical Advance Directive? No No No No No No No  Would patient like information on creating a medical advance directive? No - Patient declined No - Patient declined Yes (MAU/Ambulatory/Procedural Areas - Information given) Yes (MAU/Ambulatory/Procedural Areas - Information given)  Yes (MAU/Ambulatory/Procedural Areas - Information given) Yes (MAU/Ambulatory/Procedural Areas - Information given)    Current Medications (verified) Outpatient Encounter Medications as of 07/07/2023  Medication Sig   acetaminophen (TYLENOL) 650 MG CR tablet Take 650 mg by mouth in the morning and at bedtime.   albuterol (VENTOLIN HFA) 108 (90 Base) MCG/ACT inhaler Inhale 2 puffs into the lungs every 6 (six) hours as needed for wheezing or shortness of breath.   Alcohol Swabs (DROPSAFE ALCOHOL PREP) 70 % PADS USE AS DIRECTED   allopurinol (ZYLOPRIM)  100 MG tablet Take 1 tablet (100 mg total) by mouth 2 (two) times daily.   apixaban (ELIQUIS) 5 MG TABS tablet Take 1 tablet (5 mg total) by mouth 2 (two) times daily.   Blood Glucose Calibration (TRUE METRIX LEVEL 1) Low SOLN 1 each by In Vitro route once a week.   Blood Glucose Monitoring Suppl (TRUE METRIX AIR GLUCOSE METER) w/Device KIT Inject 1 each into the skin 4 (four) times daily as needed. Check fsbs 4 times daily E11.22   Cholecalciferol (VITAMIN D) 2000 UNITS tablet Take 2,000 Units by mouth daily.   colchicine (COLCRYS) 0.6 MG tablet Take 1-2 tablets (0.6-1.2 mg total) by mouth daily as needed.   diltiazem (CARDIZEM CD) 240 MG 24 hr capsule TAKE 1 CAPSULE EVERY DAY   Dulaglutide (TRULICITY) 3 MG/0.5ML SOPN Inject 3 mg as directed once a week.   Elastic Bandages & Supports (MEDICAL COMPRESSION STOCKINGS) MISC 2 each by Does not apply route daily.   Ferrous Sulfate (IRON SUPPLEMENT PO) Take by mouth.   fluticasone (FLONASE) 50 MCG/ACT nasal spray USE 2 SPRAYS IN EACH NOSTRIL EVERY DAY   fluticasone-salmeterol (WIXELA INHUB) 250-50 MCG/ACT AEPB Inhale 1 puff into the lungs in the morning and at bedtime.   furosemide (LASIX) 40 MG tablet Take 0.5 tablets (20 mg total) by mouth 2 (two) times daily.   HUMALOG MIX 75/25 KWIKPEN (75-25) 100 UNIT/ML KwikPen DIAL AND INJECT 10 UNITS UNDER THE SKIN TWICE DAILY WITH A MEAL. MAX DAILY DOSE 20 UNITS. DISCARD USED PEN AFTER  10 DAYS.   Insulin Pen Needle (DROPLET PEN NEEDLES) 30G X 8 MM MISC USE TWICE DAILY WITH INSULIN   irbesartan (AVAPRO) 300 MG tablet Take 1 tablet (300 mg total) by mouth daily.   loratadine (CLARITIN) 10 MG tablet Take 1 tablet (10 mg total) by mouth daily.   Magnesium Chloride 64 MG TBEC Take 1 tablet by mouth daily.   pravastatin (PRAVACHOL) 80 MG tablet Take 1 tablet (80 mg total) by mouth every evening.   TRUE METRIX BLOOD GLUCOSE TEST test strip CHECK BLOOD SUGAR FOUR TIMES DAILY   TRUEplus Lancets 33G MISC TEST BLOOD  SUGAR THREE TIMES DAILY AS NEEDED   No facility-administered encounter medications on file as of 07/07/2023.    Allergies (verified) Farxiga [dapagliflozin]   History: Past Medical History:  Diagnosis Date   Allergic rhinitis, cause unspecified    Anxiety    Chronic diastolic CHF (congestive heart failure) (HCC)    CKD (chronic kidney disease), stage III (HCC)    Edema    Essential hypertension, benign    Gout, unspecified    Heart murmur    as child   Hyperlipidemia    Lipoma of unspecified site    Other and unspecified disc disorder of lumbar region    Other general symptoms(780.99)    PAC (premature atrial contraction)    Renal insufficiency    Restless legs syndrome (RLS)    Sebaceous cyst    Super obesity    Type II or unspecified type diabetes mellitus without mention of complication, uncontrolled    Unspecified asthma(493.90)    Unspecified disease of pericardium    Unspecified sleep apnea    Unspecified vitamin D deficiency    Vaginitis and vulvovaginitis, unspecified    Venous insufficiency    Past Surgical History:  Procedure Laterality Date   CARDIAC CATHETERIZATION  2002   [facility]   CATARACT EXTRACTION W/PHACO Right 09/15/2021   Procedure: CATARACT EXTRACTION PHACO AND INTRAOCULAR LENS PLACEMENT (IOC) RIGHT 10.19 01:01.8;  Surgeon: Galen Manila, MD;  Location[facility];  Service: Ophthalmology;  Laterality: Right;   CATARACT EXTRACTION W/PHACO Left 09/29/2021   Procedure: CATARACT EXTRACTION PHACO AND INTRAOCULAR LENS PLACEMENT (IOC) LEFT DIABETIC 11.40 01:21.2;  Surgeon: Galen Manila, MD;  Location: [facility];  Service: Ophthalmology;  Laterality: Left;   COLONOSCOPY WITH PROPOFOL N/A 09/25/2018   Procedure: COLONOSCOPY WITH PROPOFOL;  Surgeon: Wyline Mood, MD;  Location: [facility] END[facility];  Service: Gastroenterology;  Laterality: N/A;   COLONOSCOPY WITH PROPOFOL N/A 04/13/2019   Procedure: COLONOSCOPY WITH PROPOFOL;  Surgeon: Wyline Mood, MD;  Location[facility] END[facility]COPY;  Service: Gastroenterology;  Laterality: N/A;   COLONOSCOPY WITH PROPOFOL N/A 06/27/2020   Procedure: COLONOSCOPY WITH PROPOFOL;  Surgeon: Wyline Mood, MD;  Location: [facility] ENDOSCOPY;  Service: Gastroenterology;  Laterality: N/A;   PERICARDIUM SURGERY     Family History  Problem Relation Age of Onset   Heart attack Mother    Hypertension Mother    Breast cancer Neg Hx    Social History   Socioeconomic History   Marital status: Married    Spouse name: Not on file   Number of children: 3   Years of education: Not on file   Highest education level: Associate degree: academic program  Occupational History   Occupation: retired  Tobacco Use   Smoking status: Never   Smokeless tobacco: Never   Tobacco comments:    n/a  Vaping Use   Vaping status: Never Used  Substance and Sexual  Activity   Alcohol use: No    Alcohol/week: 0.0 standard drinks of alcohol   Drug use: No   Sexual activity: Not Currently  Other Topics Concern   Not on file  Social History Narrative   Not on file   Social Determinants of Health   Financial Resource Strain: Low Risk  (07/07/2023)   Overall Financial Resource Strain (CARDIA)    Difficulty of Paying Living Expenses: Not very hard  Food Insecurity: No Food Insecurity (07/07/2023)   Hunger Vital Sign    Worried About Running Out of Food in the Last Year: Never true    Ran Out of Food in the Last Year: Never true  Transportation Needs: No Transportation Needs (06/24/2022)   PRAPARE - Administrator, Civil Service (Medical): No    Lack of Transportation (Non-Medical): No  Physical Activity: Inactive (07/07/2023)   Exercise Vital Sign    Days of Exercise per Week: 0 days    Minutes of Exercise per Session: 0 min  Stress: No Stress Concern Present (07/07/2023)   [employer] of Occupational Health - Occupational Stress Questionnaire    Feeling of Stress : Not at all  Social Connections:  Moderately Isolated (07/07/2023)   Social Connection and Isolation Panel [NHANES]    Frequency of Communication with Friends and Family: More than three times a week    Frequency of Social Gatherings with Friends and Family: Twice a week    Attends Religious Services: Never    Database administrator or Organizations: No    Attends Engineer, structural: Never    Marital Status: Married    Tobacco Counseling Counseling given: Not Answered Tobacco comments: n/a   Clinical Intake:  Pre-visit preparation completed: Yes  Pain : No/denies pain Pain Score: 0-No pain     BMI - recorded: 52.8 Nutritional Status: BMI > 30  Obese Nutritional Risks: None Diabetes: Yes CBG done?: No Did pt. bring in CBG monitor from home?: No  How often do you need to have someone help you when you read instructions, pamphlets, or other written materials from your doctor or pharmacy?: 1 - Never  Interpreter Needed?: No  Information entered by :: Kennedy Bucker, LPN   Activities of Daily Living    07/07/2023    3:31 PM 06/24/2023    8:55 AM  In your present state of health, do you have any difficulty performing the following activities:  Hearing? 0 0  Vision? 0 0  Difficulty concentrating or making decisions? 0 0  Walking or climbing stairs? 1 1  Comment back pain   Dressing or bathing? 0 0  Doing errands, shopping? 0 1  Preparing Food and eating ? N   Using the Toilet? N   In the past six months, have you accidently leaked urine? N   Do you have problems with loss of bowel control? N   Managing your Medications? N   Managing your Finances? N   Housekeeping or managing your Housekeeping? N     Patient Care Team: Alba Cory, MD as PCP - General (Family Medicine) Iran Ouch, MD as PCP - Cardiology (Cardiology) Iran Ouch, MD as Consulting Physician (Cardiology) Ronney Asters, Jackelyn Poling, RPH-CPP as Pharmacist Galen Manila, MD as Referring Physician  (Ophthalmology)  Indicate any recent Medical Services you may have received from other than [facility] providers in the past year (date may be approximate).     Assessment:   This is a routine wellness examination  for Dix.  Hearing/Vision screen Hearing Screening - Comments:: No aids Vision Screening - Comments:: Readers- Dr.Porfilio   Goals Addressed             This Visit's Progress    DIET - EAT MORE FRUITS AND VEGETABLES         Depression Screen    07/07/2023    3:29 PM 06/24/2023    8:55 AM 05/10/2023    8:06 AM 01/07/2023    8:02 AM 08/25/2022    9:17 AM 06/24/2022    8:01 AM 03/16/2022    8:05 AM  PHQ 2/9 Scores  PHQ - 2 Score 0 0 0 0 0 0 0  PHQ- 9 Score 0 0 0 0 0  0    Fall Risk    07/07/2023    3:31 PM 06/24/2023    8:55 AM 05/10/2023    8:06 AM 01/07/2023    8:01 AM 08/25/2022    9:17 AM  Fall Risk   Falls in the past year? 0 0 0 0 0  Number falls in past yr: 0  0 0 0  Injury with Fall? 0  0 0 0  Risk for fall due to : No Fall Risks No Fall Risks No Fall Risks No Fall Risks No Fall Risks  Follow up Falls prevention discussed;Falls evaluation completed Falls prevention discussed Falls prevention discussed Falls prevention discussed Falls prevention discussed    MEDICARE RISK AT HOME: Medicare Risk at Home Any stairs in or around the home?: Yes If so, are there any without handrails?: Yes Home free of loose throw rugs in walkways, pet beds, electrical cords, etc?: No Adequate lighting in your home to reduce risk of falls?: Yes Life alert?: No Use of a cane, walker or w/c?: Yes (office chair w/ wheels; cane when out) Grab bars in the bathroom?: Yes Shower chair or bench in shower?: Yes Elevated toilet seat or a handicapped toilet?: Yes  TIMED UP AND GO:  Was the test performed?  No    Cognitive Function:        07/07/2023    3:34 PM 06/24/2022    8:05 AM 06/15/2019    8:29 AM  6CIT Screen  What Year? 0 points 0 points 0 points  What month? 0  points 0 points 0 points  What time? 0 points 0 points 0 points  Count back from 20 0 points 0 points 0 points  Months in reverse 0 points 0 points 0 points  Repeat phrase 0 points 0 points 4 points  Total Score 0 points 0 points 4 points    Immunizations Immunization History  Administered Date(s) Administered   Fluad Quad(high Dose 65+) 05/14/2019, 05/12/2020, 07/14/2021, 07/05/2022, 05/09/2023   Influenza Split 05/16/2007   Influenza, High Dose Seasonal PF 06/24/2017, 04/25/2018   Influenza, Seasonal, Injecte, Preservative Fre 05/07/2010, 04/27/2011   Influenza,inj,Quad PF,6+ Mos 08/02/2014, 04/15/2015, 05/22/2016   Influenza-Unspecified 08/02/2014   Moderna Sars-Covid-2 Vaccination 09/28/2019, 10/26/2019, 07/02/2020   Pneumococcal Conjugate-13 08/02/2014   Pneumococcal Polysaccharide-23 04/27/2011, 06/04/2016   Tdap 09/29/2012   Zoster, Live 09/29/2012    TDAP status: Due, Education has been provided regarding the importance of this vaccine. Advised may receive this vaccine at local pharmacy or Health Dept. Aware to provide a copy of the vaccination record if obtained from local pharmacy or Health Dept. Verbalized acceptance and understanding.  Flu Vaccine status: Up to date  Pneumococcal vaccine status: Up to date  Covid-19 vaccine status: Completed vaccines  Qualifies  for Shingles Vaccine? Yes   Zostavax completed Yes   Shingrix Completed?: No.    Education has been provided regarding the importance of this vaccine. Patient has been advised to call insurance company to determine out of pocket expense if they have not yet received this vaccine. Advised may also receive vaccine at local pharmacy or Health Dept. Verbalized acceptance and understanding.  Screening Tests Health Maintenance  Topic Date Due   DTaP/Tdap/Td (2 - Td or Tdap) 09/29/2022   COVID-19 Vaccine (4 - 2023-24 season) 04/24/2023   Colonoscopy  06/28/2023   Zoster Vaccines- Shingrix (1 of 2) 08/08/2023  (Originally 02/06/1998)   Diabetic kidney evaluation - eGFR measurement  08/26/2023   Diabetic kidney evaluation - Urine ACR  08/26/2023   MAMMOGRAM  12/03/2023   HEMOGLOBIN A1C  12/22/2023   OPHTHALMOLOGY EXAM  05/12/2024   FOOT EXAM  06/23/2024   Medicare Annual Wellness (AWV)  07/06/2024   Pneumonia Vaccine 69+ Years old  Completed   INFLUENZA VACCINE  Completed   DEXA SCAN  Completed   Hepatitis C Screening  Completed   HPV VACCINES  Aged Out   Fecal DNA (Cologuard)  Discontinued    Health Maintenance  Health Maintenance Due  Topic Date Due   DTaP/Tdap/Td (2 - Td or Tdap) 09/29/2022   COVID-19 Vaccine (4 - 2023-24 season) 04/24/2023   Colonoscopy  06/28/2023    Colorectal cancer screening: Type of screening: Colonoscopy. Completed SCHEDULED FOR 08/02/23. Repeat every 3 years  Mammogram status: Completed 12/03/22. Repeat every year  **NOTE FROM [facility]: "ABOVE MAXIMUM WEIGHT FOR BONE DENSITY"   Lung Cancer Screening: (Low Dose CT Chest recommended if Age 19-80 years, 20 pack-year currently smoking OR have quit w/in 15years.) does not qualify.    Additional Screening:  Hepatitis C Screening: does qualify; Completed 04/02/13  Vision Screening: Recommended annual ophthalmology exams for early detection of glaucoma and other disorders of the eye. Is the patient up to date with their annual eye exam?  Yes  Who is the provider or what is the name of the office in which the patient attends annual eye exams? Dr.Porfilio If pt is not established with a provider, would they like to be referred to a provider to establish care? No .   Dental Screening: Recommended annual dental exams for proper oral hygiene  Diabetic Foot Exam: Diabetic Foot Exam: Completed 06/24/23  Community Resource Referral / Chronic Care Management: CRR required this visit?  No   CCM required this visit?  No     Plan:     I have personally reviewed and noted the following in the patient's chart:    Medical and social history Use of alcohol, tobacco or illicit drugs  Current medications and supplements including opioid prescriptions. Patient is not currently taking opioid prescriptions. Functional ability and status Nutritional status Physical activity Advanced directives List of other physicians Hospitalizations, surgeries, and ER visits in previous 12 months Vitals Screenings to include cognitive, depression, and falls Referrals and appointments  In addition, I have reviewed and discussed with patient certain preventive protocols, quality metrics, and best practice recommendations. A written personalized care plan for preventive services as well as general preventive health recommendations were provided to patient.     Hal Hope, LPN   [id number]/[id number]   After Visit Summary: (MyChart) Due to this being a telephonic visit, the after visit summary with patients personalized plan was offered to patient via MyChart   Nurse Notes: none

## 2023-07-07 NOTE — Patient Instructions (Addendum)
Amber Reyes , Thank you for taking time to come for your Medicare Wellness Visit. I appreciate your ongoing commitment to your health goals. Please review the following plan we discussed and let me know if I can assist you in the future.   Referrals/Orders/Follow-Ups/Clinician Recommendations: none  This is a list of the screening recommended for you and due dates:  Health Maintenance  Topic Date Due   DTaP/Tdap/Td vaccine (2 - Td or Tdap) 09/29/2022   COVID-19 Vaccine (4 - 2023-24 season) 04/24/2023   Colon Cancer Screening  06/28/2023   Zoster (Shingles) Vaccine (1 of 2) 08/08/2023*   Yearly kidney function blood test for diabetes  08/26/2023   Yearly kidney health urinalysis for diabetes  08/26/2023   Mammogram  12/03/2023   Hemoglobin A1C  12/22/2023   Eye exam for diabetics  05/12/2024   Complete foot exam   06/23/2024   Medicare Annual Wellness Visit  07/06/2024   Pneumonia Vaccine  Completed   Flu Shot  Completed   DEXA scan (bone density measurement)  Completed   Hepatitis C Screening  Completed   HPV Vaccine  Aged Out   Cologuard (Stool DNA test)  Discontinued  *Topic was postponed. The date shown is not the original due date.    Advanced directives: (ACP Link)Information on Advanced Care Planning can be found at [facility] Directives Advance Health Care Directives ([email or web address]   Next Medicare Annual Wellness Visit scheduled for next year: Yes  07/26/24 @ 8:50 am in person

## 2023-07-07 NOTE — Patient Instructions (Signed)
Goals Addressed             This Visit's Progress    Pharmacy Goals       Please watch the mail for an envelope from [facility] containing the patient assistance program application. Please complete this application and mail back to [facility] Pharmacy Technician Noreene Larsson [place] along with a copy of your Medicare Part D prescription card and a copy of your proof of income document OR you can bring these documents to the office to have them faxed back to Attention: [name] at Fax # [phone]   If you need to call Noreene Larsson, you can reach her at [phone]  If you need to reach out to patient assistance programs regarding refills or to find out the status of your application, you can do so by calling:  Julious Oka [phone]   Please feel free to give me a call if needed for any medication related questions or concerns.     Thank you!   Estelle Grumbles, PharmD, [facility] [phone]

## 2023-07-07 NOTE — Progress Notes (Signed)
   07/07/2023  Patient ID: Lianne Bushy, female   DOB: 09/20/1945, 77 y.o.   MRN: [id number]  Follow up with [employer] patient assistance program and patient today. Patient shares that she did not receive the follow up email from [place] after we completed her online application because her email Inbox is full/unable to receive new messages. Per [employer] patient assistance, program received patient's application, but status is pending as did not receive the confirmation signatures that patient would have needed to completed via her email. Representative recommends patient complete a new application. - Patient requests to reapply using paper application  Will collaborate with PCP and CPhT to aid patient with her patient assistance program re-enrollment with [employer] for Trulicity and Humalog Mix.  Follow Up Plan: Clinical Pharmacist will follow up with patient by telephone on 09/07/2023 at 9:30 AM    Estelle Grumbles, PharmD, [facility] [phone]

## 2023-07-11 ENCOUNTER — Other Ambulatory Visit: Payer: Self-pay | Admitting: Family Medicine

## 2023-07-11 ENCOUNTER — Other Ambulatory Visit: Payer: Self-pay | Admitting: Pharmacy Technician

## 2023-07-11 DIAGNOSIS — I10 Essential (primary) hypertension: Secondary | ICD-10-CM

## 2023-07-11 DIAGNOSIS — Z5986 Financial insecurity: Secondary | ICD-10-CM

## 2023-07-11 NOTE — Progress Notes (Signed)
Pharmacy Medication Assistance Program Note    07/11/2023  Patient ID: Amber Reyes, female   DOB: [birth year], 77 y.o.   MRN: [id number]     07/11/2023  Outreach Medication One  Initial Outreach Date (Medication One) 07/11/2023  Manufacturer Medication One Retail buyer Drugs Trulicity  Dose of Humalog Mix 75/25 100 units/ml  Dose of Trulicity 3mg /0.5ml  Type of Assistance Manufacturer Assistance  Date Application Sent to Patient 07/13/2023  Application Items Requested Application;Proof of Income;Other  Date Application Sent to Prescriber 07/13/2023  Name of Prescriber [facility]       Signature   [name], CPhT [facility]  Office: [phone] Fax: [phone] Email: [email or web address]

## 2023-07-15 ENCOUNTER — Other Ambulatory Visit: Payer: Self-pay | Admitting: Family Medicine

## 2023-07-15 DIAGNOSIS — J4540 Moderate persistent asthma, uncomplicated: Secondary | ICD-10-CM

## 2023-08-01 ENCOUNTER — Other Ambulatory Visit: Payer: Self-pay | Admitting: Family Medicine

## 2023-08-01 ENCOUNTER — Encounter: Payer: Self-pay | Admitting: Gastroenterology

## 2023-08-01 DIAGNOSIS — M109 Gout, unspecified: Secondary | ICD-10-CM

## 2023-08-02 ENCOUNTER — Ambulatory Visit
Admission: RE | Admit: 2023-08-02 | Discharge: 2023-08-02 | Disposition: A | Discharge status: Home / Self Care | Payer: Medicare PPO | Attending: Gastroenterology | Admitting: Gastroenterology

## 2023-08-02 ENCOUNTER — Telehealth: Payer: Self-pay | Admitting: Pharmacy Technician

## 2023-08-02 ENCOUNTER — Encounter: Payer: Self-pay | Admitting: Gastroenterology

## 2023-08-02 ENCOUNTER — Encounter
Admission: RE | Disposition: A | Discharge status: Home / Self Care | Payer: Self-pay | Source: Home / Self Care | Attending: Gastroenterology

## 2023-08-02 ENCOUNTER — Ambulatory Visit: Payer: Medicare PPO | Admitting: Certified Registered"

## 2023-08-02 DIAGNOSIS — Z7901 Long term (current) use of anticoagulants: Secondary | ICD-10-CM | POA: Insufficient documentation

## 2023-08-02 DIAGNOSIS — K635 Polyp of colon: Secondary | ICD-10-CM | POA: Diagnosis not present

## 2023-08-02 DIAGNOSIS — Z1211 Encounter for screening for malignant neoplasm of colon: Secondary | ICD-10-CM

## 2023-08-02 DIAGNOSIS — Z794 Long term (current) use of insulin: Secondary | ICD-10-CM | POA: Diagnosis not present

## 2023-08-02 DIAGNOSIS — Z860100 Personal history of colon polyps, unspecified: Secondary | ICD-10-CM | POA: Diagnosis not present

## 2023-08-02 DIAGNOSIS — E1122 Type 2 diabetes mellitus with diabetic chronic kidney disease: Secondary | ICD-10-CM | POA: Insufficient documentation

## 2023-08-02 DIAGNOSIS — Z7985 Long-term (current) use of injectable non-insulin antidiabetic drugs: Secondary | ICD-10-CM | POA: Diagnosis not present

## 2023-08-02 DIAGNOSIS — I5032 Chronic diastolic (congestive) heart failure: Secondary | ICD-10-CM | POA: Diagnosis not present

## 2023-08-02 DIAGNOSIS — N183 Chronic kidney disease, stage 3 unspecified: Secondary | ICD-10-CM | POA: Insufficient documentation

## 2023-08-02 DIAGNOSIS — I4891 Unspecified atrial fibrillation: Secondary | ICD-10-CM | POA: Diagnosis not present

## 2023-08-02 DIAGNOSIS — Z5986 Financial insecurity: Secondary | ICD-10-CM

## 2023-08-02 DIAGNOSIS — Z7951 Long term (current) use of inhaled steroids: Secondary | ICD-10-CM | POA: Insufficient documentation

## 2023-08-02 DIAGNOSIS — I130 Hypertensive heart and chronic kidney disease with heart failure and stage 1 through stage 4 chronic kidney disease, or unspecified chronic kidney disease: Secondary | ICD-10-CM | POA: Diagnosis not present

## 2023-08-02 DIAGNOSIS — G4730 Sleep apnea, unspecified: Secondary | ICD-10-CM | POA: Diagnosis not present

## 2023-08-02 DIAGNOSIS — Z09 Encounter for follow-up examination after completed treatment for conditions other than malignant neoplasm: Secondary | ICD-10-CM | POA: Diagnosis not present

## 2023-08-02 DIAGNOSIS — D122 Benign neoplasm of ascending colon: Secondary | ICD-10-CM | POA: Diagnosis not present

## 2023-08-02 HISTORY — PX: POLYPECTOMY: SHX5525

## 2023-08-02 HISTORY — PX: COLONOSCOPY WITH PROPOFOL: SHX5780

## 2023-08-02 LAB — GLUCOSE, CAPILLARY: Glucose-Capillary: 120 mg/dL — ABNORMAL HIGH (ref 70–99)

## 2023-08-02 SURGERY — COLONOSCOPY WITH PROPOFOL
Anesthesia: General

## 2023-08-02 MED ORDER — SODIUM CHLORIDE 0.9 % IV SOLN: INTRAVENOUS | Status: DC

## 2023-08-02 MED ORDER — PROPOFOL 10 MG/ML IV BOLUS
INTRAVENOUS | Status: DC | PRN
  Administered 2023-08-02: 70 mg via INTRAVENOUS
  Administered 2023-08-02: 30 mg via INTRAVENOUS

## 2023-08-02 MED ORDER — LIDOCAINE 2% (20 MG/ML) 5 ML SYRINGE
INTRAMUSCULAR | Status: DC | PRN
  Administered 2023-08-02: 20 mg via INTRAVENOUS

## 2023-08-02 MED ORDER — GLYCOPYRROLATE 0.2 MG/ML IJ SOLN
INTRAMUSCULAR | Status: DC | PRN
  Administered 2023-08-02: .2 mg via INTRAVENOUS

## 2023-08-02 MED ORDER — PROPOFOL 500 MG/50ML IV EMUL
INTRAVENOUS | Status: DC | PRN
  Administered 2023-08-02: 120 ug/kg/min via INTRAVENOUS

## 2023-08-02 NOTE — Anesthesia Postprocedure Evaluation (Signed)
Anesthesia Post Note  Patient: Amber Reyes  Procedure(s) Performed: COLONOSCOPY WITH PROPOFOL POLYPECTOMY  Patient location during evaluation: Endoscopy Anesthesia Type: General Level of consciousness: awake and alert Pain management: pain level controlled Vital Signs Assessment: post-procedure vital signs reviewed and stable Respiratory status: spontaneous breathing, nonlabored ventilation, respiratory function stable and patient connected to nasal cannula oxygen Cardiovascular status: blood pressure returned to baseline and stable Postop Assessment: no apparent nausea or vomiting Anesthetic complications: no   No notable events documented.   Last Vitals:  Vitals:   08/02/23 1011 08/02/23 1021  BP:    Pulse:  81  Resp: 18 18  Temp:    SpO2:  99%    Last Pain:  Vitals:   08/02/23 1021  TempSrc:   PainSc: 0-No pain                 Cleda Mccreedy Damia Bobrowski

## 2023-08-02 NOTE — Anesthesia Preprocedure Evaluation (Signed)
Anesthesia Evaluation  Patient identified by MRN, date of birth, ID band Patient awake    Reviewed: Allergy & Precautions, NPO status , Patient's Chart, lab work & pertinent test results  History of Anesthesia Complications Negative for: history of anesthetic complications  Airway Mallampati: III  TM Distance: <3 FB Neck ROM: full    Dental  (+) Chipped   Pulmonary asthma , sleep apnea    Pulmonary exam normal        Cardiovascular Exercise Tolerance: Good hypertension, (-) angina +CHF  Normal cardiovascular exam     Neuro/Psych negative neurological ROS  negative psych ROS   GI/Hepatic negative GI ROS, Neg liver ROS,neg GERD  ,,  Endo/Other  diabetes  Class 4 obesity  Renal/GU Renal disease  negative genitourinary   Musculoskeletal   Abdominal   Peds  Hematology negative hematology ROS (+)   Anesthesia Other Findings Past Medical History: No date: Allergic rhinitis, cause unspecified No date: Anxiety No date: Chronic diastolic CHF (congestive heart failure) (HCC) No date: CKD (chronic kidney disease), stage III (HCC) No date: Edema No date: Essential hypertension, benign No date: Gout, unspecified No date: Heart murmur     Comment:  as child No date: Hyperlipidemia No date: Lipoma of unspecified site No date: Other and unspecified disc disorder of lumbar region No date: Other general symptoms(780.99) No date: PAC (premature atrial contraction) No date: Renal insufficiency No date: Restless legs syndrome (RLS) No date: Sebaceous cyst 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 No date: 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 complication, uncontrolled No date: Unspecified asthma(493.90) No date: Unspecified disease of pericardium No date: Unspecified sleep apnea No date: Unspecified vitamin D deficiency No date: Vaginitis and vulvovaginitis, unspecified No date: Venous insufficiency  Past  Surgical History: 2002: CARDIAC CATHETERIZATION     Comment:  [facility] 09/15/2021: CATARACT EXTRACTION W/PHACO; Right     Comment:  Procedure: CATARACT EXTRACTION PHACO AND INTRAOCULAR               LENS PLACEMENT (IOC) RIGHT 10.19 01:01.8;  Surgeon:               Galen Manila, MD;  Location: [facility];                Service: Ophthalmology;  Laterality: Right; 09/29/2021: CATARACT EXTRACTION W/PHACO; Left     Comment:  Procedure: CATARACT EXTRACTION PHACO AND INTRAOCULAR               LENS PLACEMENT (IOC) LEFT DIABETIC 11.40 01:21.2;                Surgeon: Galen Manila, MD;  Location: [facility] SURGERY              CNTR;  Service: Ophthalmology;  Laterality: Left; 09/25/2018: COLONOSCOPY WITH PROPOFOL; N/A     Comment:  Procedure: COLONOSCOPY WITH PROPOFOL;  Surgeon: Wyline Mood, MD;  Location: [facility] ENDOSCOPY;  Service:               Gastroenterology;  Laterality: N/A; 04/13/2019: COLONOSCOPY WITH PROPOFOL; N/A     Comment:  Procedure: COLONOSCOPY WITH PROPOFOL;  Surgeon: Wyline Mood, MD;  Location: [facility] ENDOSCOPY;  Service:               Gastroenterology;  Laterality: N/A; 06/27/2020: COLONOSCOPY WITH PROPOFOL; N/A  Comment:  Procedure: COLONOSCOPY WITH PROPOFOL;  Surgeon: Wyline Mood, MD;  Location: [facility] ENDOSCOPY;  Service:               Gastroenterology;  Laterality: N/A; No date: PERICARDIUM SURGERY     Reproductive/Obstetrics negative OB ROS                             Anesthesia Physical Anesthesia Plan  ASA: 3  Anesthesia Plan: General   Post-op Pain Management:    Induction: Intravenous  PONV Risk Score and Plan: Propofol infusion and TIVA  Airway Management Planned: Natural Airway and Nasal Cannula  Additional Equipment:   Intra-op Plan:   Post-operative Plan:   Informed Consent: I have reviewed the patients History and Physical, chart, labs and discussed the procedure including  the risks, benefits and alternatives for the proposed anesthesia with the patient or authorized representative who has indicated his/her understanding and acceptance.     Dental Advisory Given  Plan Discussed with: Anesthesiologist, CRNA and Surgeon  Anesthesia Plan Comments: (Patient consented for risks of anesthesia including but not limited to:  - adverse reactions to medications - risk of airway placement if required - damage to eyes, teeth, lips or other oral mucosa - nerve damage due to positioning  - sore throat or hoarseness - Damage to heart, brain, nerves, lungs, other parts of body or loss of life  Patient voiced understanding and assent.)       Anesthesia Quick Evaluation

## 2023-08-02 NOTE — Transfer of Care (Signed)
Immediate Anesthesia Transfer of Care Note  Patient: Amber Reyes  Procedure(s) Performed: COLONOSCOPY WITH PROPOFOL POLYPECTOMY  Patient Location: Endoscopy Unit  Anesthesia Type:General  Level of Consciousness: awake and oriented  Airway & Oxygen Therapy: Patient Spontanous Breathing  Post-op Assessment: Report given to RN and Post -op Vital signs reviewed and stable  Post vital signs: Reviewed  Last Vitals:  Vitals Value Taken Time  BP 118/41 08/02/23 1003  Temp 36.1 C 08/02/23 1001  Pulse 81 08/02/23 1004  Resp 18 08/02/23 1001  SpO2 100 % 08/02/23 1004  Vitals shown include unfiled device data.  Last Pain:  Vitals:   08/02/23 1001  TempSrc: Temporal  PainSc: 0-No pain         Complications: No notable events documented.

## 2023-08-02 NOTE — Progress Notes (Signed)
Pharmacy Medication Assistance Program Note    08/02/2023  Patient ID: Amber Reyes, female   DOB: 1945/12/10, 77 y.o.   MRN: [id number]     07/11/2023 08/02/2023  Outreach Medication One  Initial Outreach Date (Medication One) 07/11/2023   Manufacturer Medication One Actor Drugs Trulicity   Dose of Humalog Mix 75/25 100 units/ml   Dose of Trulicity 3mg /0.81ml   Type of Assistance Manufacturer Assistance   Date Application Sent to Patient 07/13/2023   Application Items Requested Application;Proof of Income;Other   Date Application Sent to Prescriber 07/13/2023   Name of Prescriber Alba Cory   Date Application Received From Patient  07/27/2023  Application Items Received From Patient  Application;Proof of Income;Other  Date Application Received From Provider  07/13/2023  Date Application Submitted to Manufacturer  08/02/2023  Method Application Sent to Manufacturer  Fax       Signature  Kristopher Glee [facility]  Office: [phone] Fax: [phone] Email: [email or web address]

## 2023-08-02 NOTE — Op Note (Signed)
[facility] Patient Name: Amber Reyes Procedure Date: 08/02/2023 9:31 AM MRN: [id number] Account #: [id number] Date of Birth: August 08, 1946 Admit Type: Outpatient Age: 77 Room: [facility] ENDO ROOM 2 Gender: Female Note Status: Finalized Instrument Name: Prentice Docker [id number] Procedure:             Colonoscopy Indications:           Surveillance: Personal history of adenomatous polyps                         on last colonoscopy > 3 years ago Providers:             Wyline Mood MD, MD Referring MD:          No Local Md, MD (Referring MD) Medicines:             Monitored Anesthesia Care Complications:         No immediate complications. Procedure:             Pre-Anesthesia Assessment:                        - Prior to the procedure, a History and Physical was                         performed, and patient medications, allergies and                         sensitivities were reviewed. The patient's tolerance                         of previous anesthesia was reviewed.                        - The risks and benefits of the procedure and the                         sedation options and risks were discussed with the                         patient. All questions were answered and informed                         consent was obtained.                        - ASA Grade Assessment: II - A patient with mild                         systemic disease.                        After obtaining informed consent, the colonoscope was                         passed under direct vision. Throughout the procedure,                         the patient's blood pressure, pulse, and oxygen  saturations were monitored continuously. The                         Colonoscope was introduced through the anus and                         advanced to the the cecum, identified by the                         appendiceal orifice. The colonoscopy was performed                          with ease. The patient tolerated the procedure well.                         The quality of the bowel preparation was excellent.                         The ileocecal valve, appendiceal orifice, and rectum                         were photographed. Findings:      The perianal and digital rectal examinations were normal.      Two sessile polyps were found in the ascending colon. The polyps were 2       to 3 mm in size. These polyps were removed with a jumbo cold forceps.       Resection and retrieval were complete.      A 3 mm polyp was found in the descending colon. The polyp was sessile.       The polyp was removed with a jumbo cold forceps. Resection and retrieval       were complete.      The exam was otherwise without abnormality on direct and retroflexion       views. Impression:            - Two 2 to 3 mm polyps in the ascending colon, removed                         with a jumbo cold forceps. Resected and retrieved.                        - One 3 mm polyp in the descending colon, removed with                         a jumbo cold forceps. Resected and retrieved.                        - The examination was otherwise normal on direct and                         retroflexion views. Recommendation:        - Discharge patient to home (with escort).                        - Resume previous diet.                        - Continue present medications.                        -  Await pathology results.                        - Repeat colonoscopy is not recommended due to current                         age (43 years or older) for surveillance. Procedure Code(s):     --- Professional ---                        [id number], Colonoscopy, flexible; with biopsy, single or                         multiple Diagnosis Code(s):     --- Professional ---                        Z86.010, Personal history of colonic polyps                        D12.2, Benign neoplasm of ascending colon                         D12.4, Benign neoplasm of descending colon CPT copyright 2022 American Medical Association. All rights reserved. The codes documented in this report are preliminary and upon coder review may  be revised to meet current compliance requirements. Wyline Mood, MD Wyline Mood MD, MD 08/02/2023 10:00:17 AM This report has been signed electronically. Number of Addenda: 0 Note Initiated On: 08/02/2023 9:31 AM Scope Withdrawal Time: 0 hours 13 minutes 23 seconds  Total Procedure Duration: 0 hours 16 minutes 33 seconds  Estimated Blood Loss:  Estimated blood loss: none.      [facility]

## 2023-08-02 NOTE — H&P (Signed)
Wyline Mood, MD [address], [place], [place], [address] [address], [place], [place], [address] Phone: [phone]  Fax: [phone]  Primary Care Physician:  Alba Cory, MD   Pre-Procedure History & Physical: HPI:  Amber Reyes is a 77 y.o. female is here for an colonoscopy.   Past Medical History:  Diagnosis Date   Allergic rhinitis, cause unspecified    Anxiety    Chronic diastolic CHF (congestive heart failure) (HCC)    CKD (chronic kidney disease), stage III (HCC)    Edema    Essential hypertension, benign    Gout, unspecified    Heart murmur    as child   Hyperlipidemia    Lipoma of unspecified site    Other and unspecified disc disorder of lumbar region    Other general symptoms(780.99)    PAC (premature atrial contraction)    Renal insufficiency    Restless legs syndrome (RLS)    Sebaceous cyst    Super obesity    Type II or unspecified type diabetes mellitus without mention of complication, uncontrolled    Unspecified asthma(493.90)    Unspecified disease of pericardium    Unspecified sleep apnea    Unspecified vitamin D deficiency    Vaginitis and vulvovaginitis, unspecified    Venous insufficiency     Past Surgical History:  Procedure Laterality Date   CARDIAC CATHETERIZATION  2002   [facility]   CATARACT EXTRACTION W/PHACO Right 09/15/2021   Procedure: CATARACT EXTRACTION PHACO AND INTRAOCULAR LENS PLACEMENT (IOC) RIGHT 10.19 01:01.8;  Surgeon: Galen Manila, MD;  Location: [facility];  Service: Ophthalmology;  Laterality: Right;   CATARACT EXTRACTION W/PHACO Left 09/29/2021   Procedure: CATARACT EXTRACTION PHACO AND INTRAOCULAR LENS PLACEMENT (IOC) LEFT DIABETIC 11.40 01:21.2;  Surgeon: Galen Manila, MD;  Location: [facility] SURGERY CNTR;  Service: Ophthalmology;  Laterality: Left;   COLONOSCOPY WITH PROPOFOL N/A 09/25/2018   Procedure: COLONOSCOPY WITH PROPOFOL;  Surgeon: Wyline Mood, MD;  Location: [facility]  ENDOSCOPY;  Service: Gastroenterology;  Laterality: N/A;   COLONOSCOPY WITH PROPOFOL N/A 04/13/2019   Procedure: COLONOSCOPY WITH PROPOFOL;  Surgeon: Wyline Mood, MD;  Location: [facility] ENDOSCOPY;  Service: Gastroenterology;  Laterality: N/A;   COLONOSCOPY WITH PROPOFOL N/A 06/27/2020   Procedure: COLONOSCOPY WITH PROPOFOL;  Surgeon: Wyline Mood, MD;  Location: [facility] ENDOSCOPY;  Service: Gastroenterology;  Laterality: N/A;   PERICARDIUM SURGERY      Prior to Admission medications   Medication Sig Start Date End Date Taking? Authorizing Provider  albuterol (VENTOLIN HFA) 108 (90 Base) MCG/ACT inhaler Inhale 2 puffs into the lungs every 6 (six) hours as needed for wheezing or shortness of breath. 06/24/23  Yes Sowles, Danna Hefty, MD  allopurinol (ZYLOPRIM) 100 MG tablet Take 1 tablet (100 mg total) by mouth 2 (two) times daily. 01/07/23  Yes Sowles, Danna Hefty, MD  diltiazem (CARDIZEM CD) 240 MG 24 hr capsule TAKE 1 CAPSULE EVERY DAY 06/27/23  Yes Iran Ouch, MD  Ferrous Sulfate (IRON SUPPLEMENT PO) Take by mouth.   Yes [provider]  furosemide (LASIX) 40 MG tablet Take 0.5 tablets (20 mg total) by mouth 2 (two) times daily. 01/07/23  Yes Sowles, Danna Hefty, MD  HUMALOG MIX 75/25 KWIKPEN (75-25) 100 UNIT/ML KwikPen DIAL AND INJECT 10 UNITS UNDER THE SKIN TWICE DAILY WITH A MEAL. MAX DAILY DOSE 20 UNITS. DISCARD USED PEN AFTER 10 DAYS. 06/03/23  Yes Sowles, Danna Hefty, MD  pravastatin (PRAVACHOL) 80 MG tablet Take 1 tablet (80 mg total) by mouth every  evening. 01/07/23  Yes Sowles, Danna Hefty, MD  acetaminophen (TYLENOL) 650 MG CR tablet Take 650 mg by mouth in the morning and at bedtime.    [provider]  Alcohol Swabs (DROPSAFE ALCOHOL PREP) 70 % PADS USE AS DIRECTED 05/18/23   Carlynn Purl, Danna Hefty, MD  apixaban (ELIQUIS) 5 MG TABS tablet Take 1 tablet (5 mg total) by mouth 2 (two) times daily. 11/11/22   Iran Ouch, MD  Blood Glucose Calibration (TRUE METRIX LEVEL 1) Low SOLN 1 each by In  Vitro route once a week. 12/12/17   Alba Cory, MD  Blood Glucose Monitoring Suppl (TRUE METRIX AIR GLUCOSE METER) w/Device KIT Inject 1 each into the skin 4 (four) times daily as needed. Check fsbs 4 times daily E11.22 11/01/18   Alba Cory, MD  Cholecalciferol (VITAMIN D) 2000 UNITS tablet Take 2,000 Units by mouth daily.    [provider]  colchicine (COLCRYS) 0.6 MG tablet Take 1-2 tablets (0.6-1.2 mg total) by mouth daily as needed. 11/25/17   Alba Cory, MD  Dulaglutide (TRULICITY) 3 MG/0.5ML SOPN Inject 3 mg as directed once a week. 03/16/22   Alba Cory, MD  Elastic Bandages & Supports (MEDICAL COMPRESSION STOCKINGS) MISC 2 each by Does not apply route daily. 04/20/16   Alba Cory, MD  fluticasone (FLONASE) 50 MCG/ACT nasal spray USE 2 SPRAYS IN EACH NOSTRIL EVERY DAY 03/25/23   Carlynn Purl, Danna Hefty, MD  fluticasone-salmeterol [facility] INHUB) 250-50 MCG/ACT AEPB Inhale 1 puff into the lungs in the morning and at bedtime. 06/24/23   Alba Cory, MD  Insulin Pen Needle (DROPLET PEN NEEDLES) 30G X 8 MM MISC USE TWICE DAILY WITH INSULIN 06/27/23   Carlynn Purl, Danna Hefty, MD  irbesartan (AVAPRO) 300 MG tablet TAKE 1 TABLET EVERY DAY 07/11/23   Carlynn Purl, Danna Hefty, MD  loratadine (CLARITIN) 10 MG tablet Take 1 tablet (10 mg total) by mouth daily. 06/24/23   Alba Cory, MD  Magnesium Chloride 64 MG TBEC Take 1 tablet by mouth daily. 11/03/22   [provider]  TRUE METRIX BLOOD GLUCOSE TEST test strip CHECK BLOOD SUGAR FOUR TIMES DAILY 09/15/22   Alba Cory, MD  TRUEplus Lancets 33G MISC TEST BLOOD SUGAR THREE TIMES DAILY AS NEEDED 03/25/23   Alba Cory, MD    Allergies as of 06/27/2023 - Review Complete 06/27/2023  Allergen Reaction Noted   Farxiga [dapagliflozin] Hives 03/16/2022    Family History  Problem Relation Age of Onset   Heart attack Mother    Hypertension Mother    Breast cancer Neg Hx     Social History   Socioeconomic History   Marital  status: Married    Spouse name: Not on file   Number of children: 3   Years of education: Not on file   Highest education level: Associate degree: academic program  Occupational History   Occupation: retired  Tobacco Use   Smoking status: Never   Smokeless tobacco: Never   Tobacco comments:    n/a  Vaping Use   Vaping status: Never Used  Substance and Sexual Activity   Alcohol use: No    Alcohol/week: 0.0 standard drinks of alcohol   Drug use: No   Sexual activity: Not Currently  Other Topics Concern   Not on file  Social History Narrative   Not on file   Social Determinants of Health   Financial Resource Strain: Low Risk  (07/07/2023)   Overall Financial Resource Strain (CARDIA)    Difficulty of Paying Living Expenses: Not very hard  Food Insecurity: No Food Insecurity (07/07/2023)   Hunger Vital Sign    Worried About Running Out of Food in the Last Year: Never true    Ran Out of Food in the Last Year: Never true  Transportation Needs: No Transportation Needs (06/24/2022)   PRAPARE - Administrator, Civil Service (Medical): No    Lack of Transportation (Non-Medical): No  Physical Activity: Inactive (07/07/2023)   Exercise Vital Sign    Days of Exercise per Week: 0 days    Minutes of Exercise per Session: 0 min  Stress: No Stress Concern Present (07/07/2023)   [employer] of Occupational Health - Occupational Stress Questionnaire    Feeling of Stress : Not at all  Social Connections: Moderately Isolated (07/07/2023)   Social Connection and Isolation Panel [NHANES]    Frequency of Communication with Friends and Family: More than three times a week    Frequency of Social Gatherings with Friends and Family: Twice a week    Attends Religious Services: Never    Database administrator or Organizations: No    Attends Banker Meetings: Never    Marital Status: Married  Catering manager Violence: Not At Risk (07/07/2023)   Humiliation, Afraid,  Rape, and Kick questionnaire    Fear of Current or Ex-Partner: No    Emotionally Abused: No    Physically Abused: No    Sexually Abused: No    Review of Systems: See HPI, otherwise negative ROS  Physical Exam: BP (!) 160/62   Pulse 81   Temp (!) 96.5 F (35.8 C) (Temporal)   Resp 18   SpO2 100%  General:   Alert,  pleasant and cooperative in NAD Head:  Normocephalic and atraumatic. Neck:  Supple; no masses or thyromegaly. Lungs:  Clear throughout to auscultation, normal respiratory effort.    Heart:  +S1, +S2, Regular rate and rhythm, No edema. Abdomen:  Soft, nontender and nondistended. Normal bowel sounds, without guarding, and without rebound.   Neurologic:  Alert and  oriented x4;  grossly normal neurologically.  Impression/Plan: Lianne Bushy is here for an colonoscopy to be performed for surveillance due to prior history of colon polyps   Risks, benefits, limitations, and alternatives regarding  colonoscopy have been reviewed with the patient.  Questions have been answered.  All parties agreeable.   Wyline Mood, MD  08/02/2023, 9:21 AM

## 2023-08-03 ENCOUNTER — Encounter: Payer: Self-pay | Admitting: Gastroenterology

## 2023-08-03 LAB — SURGICAL PATHOLOGY

## 2023-08-12 DIAGNOSIS — G4733 Obstructive sleep apnea (adult) (pediatric): Secondary | ICD-10-CM | POA: Diagnosis not present

## 2023-08-23 ENCOUNTER — Ambulatory Visit: Payer: Self-pay | Admitting: *Deleted

## 2023-08-23 NOTE — Telephone Encounter (Signed)
  Chief Complaint: Runny nose congestion, cough Symptoms: Coughing up clear mucus and clearing my throat a lot, runny nose, fever 99 Frequency: The last 3 days Pertinent Negatives: Patient denies sore throat or other symptoms. Disposition: [] ED /[] Urgent Care (no appt availability in office) / [] Appointment(In office/virtual)/ []  [facility]/ [x] Home Care/ [] Refused Recommended Disposition /[]  Mobile Bus/ []  Follow-up with PCP Additional Notes: Pharmacist recommended Coricidin HBP which she is using.   She was wanting an antibiotic or something called in.    I let her know antibiotics will not help a virus.    She is doing the right thing taking the Coricidin HBP.    Went over reasons to call us  back in home care advice.    Pt. Was agreeable to this plan.

## 2023-08-23 NOTE — Telephone Encounter (Signed)
 Message from Amber Reyes sent at 08/23/2023  8:32 AM EST  Summary: cold / rx req   The patient would like to be prescribed medication to help with symptoms for a cold they've experienced for the past three days  The patient shares that their main concerns are cough, congestion and a runny nose  Please contact further when possible          Call History  Contact Date/Time Type Contact Phone/Fax By  08/23/2023 08:31 AM EST Phone (Incoming) Maghan, Jessee (Self) [phone] MIKE) Coley, Everette A   Reason for Disposition  Common cold with no complications  Answer Assessment - Initial Assessment Questions 1. ONSET: When did the nasal discharge start?      Runny nose and coughing and congestion that I've had for 3 days now. I went to pharmacy and was given Coricidin HBP.   I was wondering if she could call in something.   I'm coughing up clear fluids.    No fever.    I use the Flonase .    2. AMOUNT: How much discharge is there?      I have a runny nose with clear mucus 3. COUGH: Do you have a cough? If Yes, ask: Describe the color of your sputum (clear, white, yellow, green)     Yes  Coughing up a little clear mucus.   It's just an aggravating cough and I'm clearing my throat a lot.    4. RESPIRATORY DISTRESS: Describe your breathing.      Fine 5. FEVER: Do you have a fever? If Yes, ask: What is your temperature, how was it measured, and when did it start?     99 fever last night 6. SEVERITY: Overall, how bad are you feeling right now? (e.g., doesn't interfere with normal activities, staying home from school/work, staying in bed)      This cough is bothering me.   7. OTHER SYMPTOMS: Do you have any other symptoms? (e.g., sore throat, earache, wheezing, vomiting)     Denies sore throat.   Just the nasal congestion, coughing and runny nose with low grade fever.   8. PREGNANCY: Is there any chance you are pregnant? When was your last menstrual period?     Not  asked due to age  Protocols used: Common Cold-A-AH

## 2023-08-25 ENCOUNTER — Telehealth: Payer: Self-pay | Admitting: Family Medicine

## 2023-08-25 ENCOUNTER — Ambulatory Visit: Payer: Self-pay

## 2023-08-25 NOTE — Telephone Encounter (Signed)
 Pt is scheduled

## 2023-08-25 NOTE — Telephone Encounter (Signed)
 She needs to be seen, provider will not prescribe anything without being seen as I told her already.

## 2023-08-25 NOTE — Telephone Encounter (Signed)
 Called pt to see about an appt and we have nothing today or tomorrow. She is just asking for cough medicine. Their covid test has come back positive now.

## 2023-08-25 NOTE — Telephone Encounter (Signed)
 Chief Complaint: COVID positive Symptoms: cough Frequency: onset symptoms 08/21/23, tested positive on 08/23/23 Pertinent Negatives: Patient denies other symptoms Disposition: [] ED /[] Urgent Care (no appt availability in office) / [x] Appointment(In office/virtual)/ []  [facility]/ [] Home Care/ [] Refused Recommended Disposition /[] [place] [facility]/ []  Follow-up with PCP Additional Notes: Patient says she was exposed to COVID on 08/18/23 and started having symptoms on 08/23/23. She's been taking OTC Tussin cough syrup, using a nasal spray during the day and flonase  at night, using her inhalers. She says she has an appointment tomorrow morning, but wants to know if there is something else she should be doing for the cough. Advised continue with the OTC and everything that she's doing, advised of quarantine criteria, when to go to the ED. Patient verbalized understanding.    Summary: COVID   Pt is calling in because she has been diagnosed with COVID and wanted to know what else she could take to help with the symptoms. No appointment available until Monday at [facility] and pt wanted something sooner. Pt would like advice on what to do.      Reason for Disposition  [1] HIGH RISK patient (e.g., weak immune system, age > 64 years, obesity with BMI 30 or higher, pregnant, chronic lung disease or other chronic medical condition) AND [2] COVID symptoms (e.g., cough, fever)  (Exceptions: Already seen by PCP and no new or worsening symptoms.)  Answer Assessment - Initial Assessment Questions 1. COVID-19 DIAGNOSIS: How do you know that you have COVID? (e.g., positive lab test or self-test, diagnosed by doctor or NP/PA, symptoms after exposure).     Home test positive on 08/23/23 2. COVID-19 EXPOSURE: Was there any known exposure to COVID before the symptoms began? CDC Definition of close contact: within 6 feet (2 meters) for a total of 15 minutes or more over a 24-hour period.      Yes on  08/18/23 3. ONSET: When did the COVID-19 symptoms start?      08/21/23 4. WORST SYMPTOM: What is your worst symptom? (e.g., cough, fever, shortness of breath, muscle aches)     Cough 5. COUGH: Do you have a cough? If Yes, ask: How bad is the cough?       Yes 6. FEVER: Do you have a fever? If Yes, ask: What is your temperature, how was it measured, and when did it start?     No 7. RESPIRATORY STATUS: Describe your breathing? (e.g., normal; shortness of breath, wheezing, unable to speak)      No 8. BETTER-SAME-WORSE: Are you getting better, staying the same or getting worse compared to yesterday?  If getting worse, ask, In what way?     Better 9. OTHER SYMPTOMS: Do you have any other symptoms?  (e.g., chills, fatigue, headache, loss of smell or taste, muscle pain, sore throat)     Headache from coughing, soreness to sides from coughing, little fatigue 10. HIGH RISK DISEASE: Do you have any chronic medical problems? (e.g., asthma, heart or lung disease, weak immune system, obesity, etc.)       Asthma, A-fib  Protocols used: Coronavirus (COVID-19) Diagnosed or Suspected-A-AH

## 2023-08-25 NOTE — Telephone Encounter (Signed)
 Pt needs an appt please schedule. I advised her she can also do e virtaul visit with cone. Pt prefers virtual visit with anyone available FYI

## 2023-08-25 NOTE — Telephone Encounter (Signed)
 Copied from CRM [id number]. Topic: General - Other >> Aug 23, 2023 12:29 PM Selinda RAMAN wrote: Reason for CRM: The patient called back in hoping her provider or her providers nurse saw her triage notes from earlier and could give her a call back. Please assist patient further

## 2023-08-26 ENCOUNTER — Telehealth: Payer: Medicare PPO | Admitting: Family Medicine

## 2023-08-26 DIAGNOSIS — U071 COVID-19: Secondary | ICD-10-CM

## 2023-08-26 DIAGNOSIS — Z794 Long term (current) use of insulin: Secondary | ICD-10-CM | POA: Diagnosis not present

## 2023-08-26 DIAGNOSIS — E119 Type 2 diabetes mellitus without complications: Secondary | ICD-10-CM | POA: Diagnosis not present

## 2023-08-26 DIAGNOSIS — R051 Acute cough: Secondary | ICD-10-CM

## 2023-08-26 MED ORDER — HYDROCOD POLI-CHLORPHE POLI ER 10-8 MG/5ML PO SUER: 5.0000 mL | Freq: Every evening | ORAL | 0 refills | Status: DC | PRN

## 2023-08-26 MED ORDER — BENZONATATE 100 MG PO CAPS: 100.0000 mg | ORAL_CAPSULE | Freq: Three times a day (TID) | ORAL | 0 refills | Status: DC | PRN

## 2023-08-26 NOTE — Progress Notes (Signed)
 Name: Amber Reyes   MRN: [id number]    DOB: 1946-05-07   Date:08/26/2023       Progress Note  Subjective  Chief Complaint  Chief Complaint  Patient presents with   Covid Positive    Sx started 12/29, testing positive 12/31   Cough    I connected with  Amber Reyes  on 08/26/23 at  8:40 AM EST by a video enabled telemedicine application and verified that I am speaking with the correct person using two identifiers.  I discussed the limitations of evaluation and management by telemedicine and the availability of in person appointments. The patient expressed understanding and agreed to proceed with a virtual visit  Staff also discussed with the patient that there may be a patient responsible charge related to this service. Patient Location: at home  Provider Location: [facility] Additional Individuals present: alone  HPI  Discussed the use of AI scribe software for clinical note transcription with the patient, who gave verbal consent to proceed.  History of Present Illness   The patient, recently diagnosed with COVID-19, began experiencing symptoms approximately 5 days after exposure on the 26th. Initial symptoms included a runny nose, cough, and congestion, with a low-grade fever of 99 degrees. The patient initially mistook these symptoms for a common cold and sought over-the-counter relief with Coricidin and Safe Tussin.  As the illness progressed, the patient's appetite decreased, favoring lighter foods such as soup. The patient's cough has persisted and intensified, leading to a sore throat and discomfort in the sides due to the constant coughing. Despite this, the patient denies experiencing shortness of breath, attributing this to the regular use of prescribed inhalers.  The patient's condition appears to be improving, with less difficulty moving around the house and a gradual return of appetite. However, the persistent cough continues to cause discomfort and disrupts the  patient's sleep. The patient also noted a rattling sensation in the chest earlier in the illness, which has since improved. The patient has been managing her diabetes effectively throughout the illness, with a recent reading of 114.  The patient's spouse was also diagnosed with COVID-19 and was prescribed LAGEVRIO within the first two days of symptom onset. The patient understands that she is outside the treatment window for this medication and is focused on managing her symptoms.          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) 02/06/2020   History of colonic polyps    Polyp of colon    Thoracic aortic atherosclerosis (HCC) 12/30/2016   Controlled type 2 diabetes mellitus with microalbuminuria, with long-term current use of insulin  (HCC) 12/23/2016   Venous insufficiency 05/21/2016   Hypertensive heart disease 05/21/2016   Anemia in chronic kidney disease 12/22/2015   Pulmonary hypertension (HCC) 12/18/2015   Xanthelasma 04/15/2015   Anxiety 04/12/2015   Chronic kidney disease (CKD), stage III (moderate) (HCC) 04/12/2015   Edema of extremities 04/12/2015   Disc disorder of lumbar region 04/12/2015   Asthma, moderate persistent 04/12/2015   Morbid obesity (HCC) 04/12/2015   Obstructive apnea 04/12/2015   Restless leg 04/12/2015   Allergic rhinitis 04/12/2015  Vitamin D  deficiency 04/12/2015   Controlled gout 04/12/2015   Premature atrial contractions 11/29/2013   Atrial fibrillation (HCC) 11/09/2013   Benign essential hypertension    Hyperlipidemia     Social History   Tobacco Use   Smoking status: Never    Smokeless tobacco: Never   Tobacco comments:    n/a  Substance Use Topics   Alcohol use: No    Alcohol/week: 0.0 standard drinks of alcohol     Current Outpatient Medications:    acetaminophen  (TYLENOL ) 650 MG CR tablet, Take 650 mg by mouth in the morning and at bedtime., Disp: , Rfl:    albuterol  (VENTOLIN  HFA) 108 (90 Base) MCG/ACT inhaler, Inhale 2 puffs into the lungs every 6 (six) hours as needed for wheezing or shortness of breath., Disp: 17 each, Rfl: 0   Alcohol Swabs (DROPSAFE ALCOHOL PREP) 70 % PADS, USE AS DIRECTED, Disp: 300 each, Rfl: 3   allopurinol  (ZYLOPRIM ) 100 MG tablet, TAKE 1 TABLET TWICE DAILY, Disp: 180 tablet, Rfl: 3   apixaban  (ELIQUIS ) 5 MG TABS tablet, Take 1 tablet (5 mg total) by mouth 2 (two) times daily., Disp: 7 tablet, Rfl: 0   Blood Glucose Calibration (TRUE METRIX LEVEL 1) Low SOLN, 1 each by In Vitro route once a week., Disp: 1 each, Rfl: 1   Blood Glucose Monitoring Suppl (TRUE METRIX AIR GLUCOSE METER) w/Device KIT, Inject 1 each into the skin 4 (four) times daily as needed. Check fsbs 4 times daily E11.22, Disp: 1 kit, Rfl: 0   Cholecalciferol  (VITAMIN D ) 2000 UNITS tablet, Take 2,000 Units by mouth daily., Disp: , Rfl:    colchicine  (COLCRYS ) 0.6 MG tablet, Take 1-2 tablets (0.6-1.2 mg total) by mouth daily as needed., Disp: 30 tablet, Rfl: 0   diltiazem  (CARDIZEM  CD) 240 MG 24 hr capsule, TAKE 1 CAPSULE EVERY DAY, Disp: 90 capsule, Rfl: 3   Dulaglutide  (TRULICITY ) 3 MG/0.5ML SOPN, Inject 3 mg as directed once a week., Disp: 6 mL, Rfl: 3   Elastic Bandages & Supports (MEDICAL COMPRESSION STOCKINGS) MISC, 2 each by Does not apply route daily., Disp: 2 each, Rfl: 2   Ferrous Sulfate (IRON  SUPPLEMENT PO), Take by mouth., Disp: , Rfl:    fluticasone  (FLONASE ) 50 MCG/ACT nasal spray, USE 2 SPRAYS IN EACH NOSTRIL EVERY DAY, Disp: 48 g, Rfl: 3   fluticasone -salmeterol (WIXELA INHUB) 250-50 MCG/ACT AEPB, Inhale 1 puff into the lungs in the morning and at  bedtime., Disp: 180 each, Rfl: 1   furosemide  (LASIX ) 40 MG tablet, Take 0.5 tablets (20 mg total) by mouth 2 (two) times daily., Disp: 180 tablet, Rfl: 1   HUMALOG  MIX 75/25 KWIKPEN (75-25) 100 UNIT/ML KwikPen, DIAL AND INJECT 10 UNITS UNDER THE SKIN TWICE DAILY WITH A MEAL. MAX DAILY DOSE 20 UNITS. DISCARD USED PEN AFTER 10 DAYS., Disp: 45 mL, Rfl: 0   Insulin  Pen Needle (DROPLET PEN NEEDLES) 30G X 8 MM MISC, USE TWICE DAILY WITH INSULIN , Disp: 200 each, Rfl: 0   irbesartan  (AVAPRO ) 300 MG tablet, TAKE 1 TABLET EVERY DAY, Disp: 90 tablet, Rfl: 1   loratadine  (CLARITIN ) 10 MG tablet, Take 1 tablet (10 mg total) by mouth daily., Disp: 100 tablet, Rfl: 1   Magnesium Chloride 64 MG TBEC, Take 1 tablet by mouth daily., Disp: , Rfl:    pravastatin  (PRAVACHOL ) 80 MG tablet, Take 1 tablet (80 mg total) by mouth every evening., Disp: 90 tablet, Rfl: 1   TRUE METRIX BLOOD GLUCOSE TEST test strip, CHECK  BLOOD SUGAR FOUR TIMES DAILY, Disp: 400 strip, Rfl: 3   TRUEplus Lancets 33G MISC, TEST BLOOD SUGAR THREE TIMES DAILY AS NEEDED, Disp: 300 each, Rfl: 3  Allergies  Allergen Reactions   Farxiga  [Dapagliflozin ] Hives    I personally reviewed active problem list, medication list with the patient/caregiver today.  ROS  Ten systems reviewed and is negative except as mentioned in HPI    Objective  Virtual encounter, vitals not obtained.  There is no height or weight on file to calculate BMI.  Nursing Note and Vital Signs reviewed.  Physical Exam  Awake, alert and oriented   Assessment & Plan  Assessment and Plan    COVID-19 Symptomatic with cough, congestion, and rhinorrhea. No fever or shortness of breath. Symptoms improving but persistent cough causing chest discomfort and sleep disturbance. Outside the window for antiviral treatment. -Prescribe Tessalon  Perles for daytime cough suppression. -Prescribe Tussionex  syrup for nighttime cough suppression to aid sleep. -Advise gargling with  warm salt water for throat discomfort. -Monitor for worsening symptoms, particularly fever or increased shortness of breath, which could indicate development of pneumonia.  Diabetes Well-controlled with recent fasting glucose of 114. No change in management required. -Advise to monitor closely and adjust insulin  as needed, particularly if appetite remains decreased.  Follow-up as needed for worsening symptoms or concerns.      I provided 15  minutes of non-face-to-face time during this encounter.  Malana Eberwein F Marleta Lapierre, MD

## 2023-08-29 ENCOUNTER — Ambulatory Visit: Payer: Self-pay

## 2023-08-29 NOTE — Telephone Encounter (Signed)
 Chief Complaint: Covid symptoms Symptoms: Congestion, fatigue, cough with gray sputum, headache Frequency: constant Pertinent Negatives: Patient denies fever, chest pain, SOB Disposition: [] ED /[] Urgent Care (no appt availability in office) / [] Appointment(In office/virtual)/ []  [facility]/ [] Home Care/ [] Refused Recommended Disposition /[] [place] [facility]/ [x]  Follow-up with PCP Additional Notes: Patient states she continues to have Covid symptoms and they are getting worse. Patient reports a new onset of congestion and cough with gray sputum. Patient reports a headache and fatigue as well. Patient had a video visit with PCP on 08/26/23 and patient reports taking the cough medication that was prescribed. Patient states on 08/27/23 her symptoms got worse and now she feels like she needs an antibiotic to help get rid of the symptoms. Care advice was given and advised patient I would forward message to PCP for additional recommendations. Patient verbalized understanding.  Summary: Covid 19, nurse call back request   Nephrology called reporting that the patient has a severe case of covid 19, wants to speak to a nurse because the patient told their office that she has been trying to speak to a nurse and has not been successful.  Best contact: [phone] [place])  Please contact patient as well     Reason for Disposition  [1] PERSISTING SYMPTOMS OF COVID-19 AND [2] NEW symptom AND [3] could be serious  Answer Assessment - Initial Assessment Questions 1. COVID-19 ONSET: When did the symptoms of COVID-19 first start?     08/22/23 2. DIAGNOSIS CONFIRMATION: How were you diagnosed? (e.g., COVID-19 oral or nasal viral test; COVID-19 antibody test; doctor visit)     08/22/23 3. MAIN SYMPTOM:  What is your main concern or symptom right now? (e.g., breathing difficulty, cough, fatigue. loss of smell)     Congestion, Cough 4. SYMPTOM ONSET: When did the  Congestion  start?      Saturday  5. BETTER-SAME-WORSE: Are you getting better, staying the same, or getting worse over the last 1 to 2 weeks?     Worse 6. RECENT MEDICAL VISIT: Have you been seen by a healthcare provider (doctor, NP, PA) for these persisting COVID-19 symptoms? If Yes, ask: When were you seen? (e.g., date)     Friday 08/26/23 7. COUGH: Do you have a cough? If Yes, ask: How bad is the cough?       Yes, Moderate  8. FEVER: Do you have a fever? If Yes, ask: What is your temperature, how was it measured, and when did it start?     No, 98.3  9. BREATHING DIFFICULTY: Are you having any trouble breathing? If Yes, ask: How bad is your breathing? (e.g., mild, moderate, severe)    - MILD: No SOB at rest, mild SOB with walking, speaks normally in sentences, can lie down, no retractions, pulse < 100.    - MODERATE: SOB at rest, SOB with minimal exertion and prefers to sit, cannot lie down flat, speaks in phrases, mild retractions, audible wheezing, pulse 100-120.    - SEVERE: Very SOB at rest, speaks in single words, struggling to breathe, sitting hunched forward, retractions, pulse > 120.       No  10. OTHER SYMPTOMS: Do you have any other symptoms?  (e.g., fatigue, headache, muscle pain, weakness)       Fatigue, grey, headache 11. HIGH RISK DISEASE: Do you have any chronic medical problems? (e.g., asthma, heart or lung disease, weak immune system, obesity, etc.)       Obesity, A-fib, Asthma 12. VACCINE:  Have you gotten the COVID-19 vaccine? If Yes, ask: Which one, how many shots, when did you get it?       Yes I had 2 shots  Protocols used: Coronavirus (COVID-19) Persisting Symptoms Follow-up Call-A-AH

## 2023-08-29 NOTE — Telephone Encounter (Signed)
 Pt sx getting worst, you want her to follow up in person?

## 2023-08-29 NOTE — Telephone Encounter (Signed)
 Appt sch'd for 1.7.2025 with [facility]

## 2023-08-30 ENCOUNTER — Ambulatory Visit
Admission: RE | Admit: 2023-08-30 | Discharge: 2023-08-30 | Disposition: A | Discharge status: Home / Self Care | Payer: Medicare PPO | Source: Ambulatory Visit | Attending: Family Medicine | Admitting: Family Medicine

## 2023-08-30 ENCOUNTER — Encounter: Payer: Self-pay | Admitting: Family Medicine

## 2023-08-30 ENCOUNTER — Ambulatory Visit (INDEPENDENT_AMBULATORY_CARE_PROVIDER_SITE_OTHER): Payer: Medicare PPO | Admitting: Family Medicine

## 2023-08-30 ENCOUNTER — Ambulatory Visit
Admission: RE | Admit: 2023-08-30 | Discharge: 2023-08-30 | Disposition: A | Discharge status: Home / Self Care | Payer: Medicare PPO | Attending: Family Medicine | Admitting: Family Medicine

## 2023-08-30 VITALS — BP 134/76 | HR 72 | Resp 18 | Ht 70.0 in | Wt 362.8 lb

## 2023-08-30 DIAGNOSIS — Z6841 Body Mass Index (BMI) 40.0 and over, adult: Secondary | ICD-10-CM | POA: Diagnosis not present

## 2023-08-30 DIAGNOSIS — R0989 Other specified symptoms and signs involving the circulatory and respiratory systems: Secondary | ICD-10-CM | POA: Insufficient documentation

## 2023-08-30 DIAGNOSIS — J45909 Unspecified asthma, uncomplicated: Secondary | ICD-10-CM | POA: Diagnosis not present

## 2023-08-30 DIAGNOSIS — R058 Other specified cough: Secondary | ICD-10-CM

## 2023-08-30 DIAGNOSIS — Z794 Long term (current) use of insulin: Secondary | ICD-10-CM | POA: Diagnosis not present

## 2023-08-30 DIAGNOSIS — E119 Type 2 diabetes mellitus without complications: Secondary | ICD-10-CM | POA: Diagnosis not present

## 2023-08-30 DIAGNOSIS — R809 Proteinuria, unspecified: Secondary | ICD-10-CM

## 2023-08-30 DIAGNOSIS — M109 Gout, unspecified: Secondary | ICD-10-CM | POA: Diagnosis not present

## 2023-08-30 DIAGNOSIS — E669 Obesity, unspecified: Secondary | ICD-10-CM

## 2023-08-30 DIAGNOSIS — U071 COVID-19: Secondary | ICD-10-CM

## 2023-08-30 DIAGNOSIS — R918 Other nonspecific abnormal finding of lung field: Secondary | ICD-10-CM | POA: Diagnosis not present

## 2023-08-30 DIAGNOSIS — Z7985 Long-term (current) use of injectable non-insulin antidiabetic drugs: Secondary | ICD-10-CM

## 2023-08-30 MED ORDER — LIDOCAINE HCL 1 % IJ SOLN
4.0000 mL | Freq: Once | INTRAMUSCULAR | Status: AC
  Administered 2023-08-30: 4 mL

## 2023-08-30 MED ORDER — AZITHROMYCIN 250 MG PO TABS: ORAL_TABLET | ORAL | 0 refills | Status: AC

## 2023-08-30 MED ORDER — CEFTRIAXONE SODIUM 500 MG IJ SOLR
1000.0000 mg | Freq: Once | INTRAMUSCULAR | Status: AC
  Administered 2023-08-30: 1000 mg via INTRAMUSCULAR

## 2023-08-30 MED ORDER — CEFTRIAXONE SODIUM 1 G IJ SOLR: 1.0000 g | Freq: Once | INTRAMUSCULAR | Status: DC

## 2023-08-30 NOTE — Progress Notes (Addendum)
 Name: Amber Reyes   MRN: [id number]    DOB: [birth year]   Date:08/30/2023       Progress Note  Subjective  Chief Complaint  Chief Complaint  Patient presents with   Cough    A lot of chest congestion    HPI  Discussed the use of AI scribe software for clinical note transcription with the patient, who gave verbal consent to proceed.  History of Present Illness   The patient, with a history of asthma, diabetes, and obesity, presents with a productive cough and chest congestion. She was exposed to COVID-19 on the 26th and started experiencing symptoms, including a runny nose, on the 29th. She was tested for COVID-19 on the 31st and the result was positive. The patient has been managing her symptoms with over-the-counter medications including Coricidin, SafeTusk, and Mucinex DM, as well as prescribed medications including Albuterol , Advair, Wixela, and tussionex for nighttime cough. She also took Coricidin  Despite these treatments, the patient reports that her cough has become productive and her chest feels congested. She describes her shortness of breath as about the same. She has been managing her diabetes well, with morning blood sugar levels reported as 111 and 95 on two recent days. She also has a history of gout, for which she takes Allopurinol  100mg  daily and Colcrys  as needed. She has been advised not to take Colcrys  while on antibiotics.          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) 02/06/2020   History of colonic polyps    Polyp of colon    Thoracic aortic atherosclerosis (HCC) 12/30/2016   Controlled type 2  diabetes mellitus with microalbuminuria, with long-term current use of insulin  (HCC) 12/23/2016   Venous insufficiency 05/21/2016   Hypertensive heart disease 05/21/2016   Anemia in chronic kidney disease 12/22/2015   Pulmonary hypertension (HCC) 12/18/2015   Xanthelasma 04/15/2015   Anxiety 04/12/2015   Chronic kidney disease (CKD), stage III (moderate) (HCC) 04/12/2015   Edema of extremities 04/12/2015   Disc disorder of lumbar region 04/12/2015   Asthma, moderate persistent 04/12/2015   Morbid obesity (HCC) 04/12/2015   Obstructive apnea 04/12/2015   Restless leg 04/12/2015   Allergic rhinitis 04/12/2015   Vitamin D  deficiency 04/12/2015   Controlled gout 04/12/2015   Premature atrial contractions 11/29/2013   Atrial fibrillation (HCC) 11/09/2013   Benign essential hypertension    Hyperlipidemia     Social History   Tobacco Use   Smoking status: Never   Smokeless tobacco: Never   Tobacco comments:    n/a  Substance Use Topics   Alcohol use: No    Alcohol/week: 0.0 standard drinks of alcohol     Current Outpatient Medications:    acetaminophen  (TYLENOL ) 650 MG CR tablet, Take 650 mg by mouth in the morning and at bedtime., Disp: , Rfl:    albuterol  (VENTOLIN  HFA) 108 (90 Base) MCG/ACT inhaler, Inhale 2 puffs into the lungs every 6 (six) hours as needed for wheezing or shortness of breath., Disp: 17 each, Rfl: 0   Alcohol Swabs (DROPSAFE ALCOHOL PREP) 70 % PADS, USE AS DIRECTED, Disp: 300 each, Rfl:  3   allopurinol  (ZYLOPRIM ) 100 MG tablet, TAKE 1 TABLET TWICE DAILY, Disp: 180 tablet, Rfl: 3   apixaban  (ELIQUIS ) 5 MG TABS tablet, Take 1 tablet (5 mg total) by mouth 2 (two) times daily., Disp: 7 tablet, Rfl: 0   benzonatate  (TESSALON ) 100 MG capsule, Take 1-2 capsules (100-200 mg total) by mouth 3 (three) times daily as needed., Disp: 40 capsule, Rfl: 0   Blood Glucose Calibration (TRUE METRIX LEVEL 1) Low SOLN, 1 each by In Vitro route once a week., Disp: 1 each, Rfl: 1    Blood Glucose Monitoring Suppl (TRUE METRIX AIR GLUCOSE METER) w/Device KIT, Inject 1 each into the skin 4 (four) times daily as needed. Check fsbs 4 times daily E11.22, Disp: 1 kit, Rfl: 0   chlorpheniramine-HYDROcodone (TUSSIONEX) 10-8 MG/5ML, Take 5 mLs by mouth at bedtime as needed for cough., Disp: 115 mL, Rfl: 0   Cholecalciferol  (VITAMIN D ) 2000 UNITS tablet, Take 2,000 Units by mouth daily., Disp: , Rfl:    colchicine  (COLCRYS ) 0.6 MG tablet, Take 1-2 tablets (0.6-1.2 mg total) by mouth daily as needed., Disp: 30 tablet, Rfl: 0   diltiazem  (CARDIZEM  CD) 240 MG 24 hr capsule, TAKE 1 CAPSULE EVERY DAY, Disp: 90 capsule, Rfl: 3   Dulaglutide  (TRULICITY ) 3 MG/0.5ML SOPN, Inject 3 mg as directed once a week., Disp: 6 mL, Rfl: 3   Elastic Bandages & Supports (MEDICAL COMPRESSION STOCKINGS) MISC, 2 each by Does not apply route daily., Disp: 2 each, Rfl: 2   Ferrous Sulfate (IRON  SUPPLEMENT PO), Take by mouth., Disp: , Rfl:    fluticasone  (FLONASE ) 50 MCG/ACT nasal spray, USE 2 SPRAYS IN EACH NOSTRIL EVERY DAY, Disp: 48 g, Rfl: 3   fluticasone -salmeterol (WIXELA INHUB) 250-50 MCG/ACT AEPB, Inhale 1 puff into the lungs in the morning and at bedtime., Disp: 180 each, Rfl: 1   furosemide  (LASIX ) 40 MG tablet, Take 0.5 tablets (20 mg total) by mouth 2 (two) times daily., Disp: 180 tablet, Rfl: 1   HUMALOG  MIX 75/25 KWIKPEN (75-25) 100 UNIT/ML KwikPen, DIAL AND INJECT 10 UNITS UNDER THE SKIN TWICE DAILY WITH A MEAL. MAX DAILY DOSE 20 UNITS. DISCARD USED PEN AFTER 10 DAYS., Disp: 45 mL, Rfl: 0   Insulin  Pen Needle (DROPLET PEN NEEDLES) 30G X 8 MM MISC, USE TWICE DAILY WITH INSULIN , Disp: 200 each, Rfl: 0   irbesartan  (AVAPRO ) 300 MG tablet, TAKE 1 TABLET EVERY DAY, Disp: 90 tablet, Rfl: 1   loratadine  (CLARITIN ) 10 MG tablet, Take 1 tablet (10 mg total) by mouth daily., Disp: 100 tablet, Rfl: 1   Magnesium Chloride 64 MG TBEC, Take 1 tablet by mouth daily., Disp: , Rfl:    pravastatin  (PRAVACHOL ) 80 MG  tablet, Take 1 tablet (80 mg total) by mouth every evening., Disp: 90 tablet, Rfl: 1   TRUE METRIX BLOOD GLUCOSE TEST test strip, CHECK BLOOD SUGAR FOUR TIMES DAILY, Disp: 400 strip, Rfl: 3   TRUEplus Lancets 33G MISC, TEST BLOOD SUGAR THREE TIMES DAILY AS NEEDED, Disp: 300 each, Rfl: 3  Allergies  Allergen Reactions   Farxiga  [Dapagliflozin ] Hives    ROS  Ten systems reviewed and is negative except as mentioned in HPI    Objective  Vitals:   08/30/23 1050  BP: 134/80  Pulse: 72  Resp: 18  SpO2: 98%  Weight: (!) 362 lb 12.8 oz (164.6 kg)  Height: 5' 10 (1.778 m)    Body mass index is 52.06 kg/m.    Physical Exam  Constitutional: Patient  appears well-developed and well-nourished. Obese  No distress.  HEENT: head atraumatic, normocephalic, pupils equal and reactive to light, neck supple Cardiovascular: Normal rate, regular rhythm and normal heart sounds.  No murmur heard. No BLE edema. Pulmonary/Chest: Effort normal and breath sounds normal. No respiratory distress. Abdominal: Soft.  There is no tenderness. Psychiatric: Patient has a normal mood and affect. behavior is normal. Judgment and thought content normal.   Recent Results (from the past 2160 hours)  POCT HgB A1C     Status: Abnormal   Collection Time: 06/24/23  8:56 AM  Result Value Ref Range   Hemoglobin A1C 7.2 (A) 4.0 - 5.6 %   HbA1c POC (<> result, manual entry)     HbA1c, POC (prediabetic range)     HbA1c, POC (controlled diabetic range)    Surgical pathology     Status: None   Collection Time: 08/02/23 12:00 AM  Result Value Ref Range   SURGICAL PATHOLOGY      SURGICAL PATHOLOGY [facility] [address] [place], [place] [address] Telephone [phone] or [phone] Fax [phone]  REPORT OF SURGICAL PATHOLOGY   Accession #: [id number] Patient Name: Amber Reyes, Amber Reyes Visit # : [id number]  MRN: [id number] Physician: Therisa Bi DOB/Age [birth year] (Age:  34) Gender: F Collected Date: 08/02/2023 Received Date: 08/02/2023  FINAL DIAGNOSIS       1. Ascending  Colon Polyp, CBX x2 :       - TUBULAR ADENOMA (2).      - NEGATIVE FOR HIGH-GRADE DYSPLASIA AND MALIGNANCY.       2. Descending Colon Polyp, CBX :       - HYPERPLASTIC POLYP.      - NEGATIVE FOR DYSPLASIA AND MALIGNANCY.       ELECTRONIC SIGNATURE : Rubinas Md, Rexene , Sports Administrator, Electronic Signature  MICROSCOPIC DESCRIPTION  CASE COMMENTS STAINS USED IN DIAGNOSIS: H&E H&E    CLINICAL HISTORY  SPECIMEN(S) OBTAINED 1. Ascending  Colon Polyp, CBX X2 2. Descending Colon Polyp, CBX  SPECIMEN COMMENTS: SPECI MEN CLINICAL INFORMATION: 1. History of colon polyps.Colon polyps    Gross Description 1. Ascending colon polyp CBX X2, received in formalin are 2 pale tan tissue fragments that are 0.3 x 0.3 x 0.2 cm and 0.5 x 0.2 x 0.1 cm.The specimen is submitted in toto in 1 block (1A). 2. Descending colon polyp CBx, received in formalin is a single 0.5 x 0.3 x 0.2 cm pale tan tissue fragment.The specimen and submitted in toto in 1 block (2A).      AMG 08/02/2023        Report signed out from the following location(s) [place] [address] [place], [place] [address] CLIA #: [id number]  [facility] [address] [place], [place] [address] CLIA #: [id number]   Glucose, capillary     Status: Abnormal   Collection Time: 08/02/23  9:07 AM  Result Value Ref Range   Glucose-Capillary 120 (H) 70 - 99 mg/dL    Comment: Glucose reference range applies only to samples taken after fasting for at least 8 hours.     Assessment & Plan      COVID-19 Recent exposure and positive test. Symptoms include productive cough and chest congestion. No worsening shortness of breath. -Continue current medications including Advair, albuterol , Mucinex, and over-the-counter congestion medication.  Suspected Pneumonia Asthmatic  patient with diabetes and obesity presenting with productive cough and chest congestion. Auscultation revealed bronchi and wheezing,  suggestive of possible pneumonia. -Order basic metabolic panel and CBC. -Order chest x-ray. -Administer Rocephin  shot. -Prescribe Z-Pak (Azithromycin ) for suspected community-acquired pneumonia.  Gout Patient on allopurinol  100mg  daily and colchicine  as needed. -Hold colchicine  while on antibiotics.  Follow-up Monitor symptoms and response to treatment.

## 2023-08-31 LAB — CBC WITH DIFFERENTIAL/PLATELET
Absolute Lymphocytes: 1007 {cells}/uL (ref 850–3900)
Absolute Monocytes: 394 {cells}/uL (ref 200–950)
Basophils Absolute: 22 {cells}/uL (ref 0–200)
Basophils Relative: 0.3 %
Eosinophils Absolute: 241 {cells}/uL (ref 15–500)
Eosinophils Relative: 3.3 %
HCT: 29.3 % — ABNORMAL LOW (ref 35.0–45.0)
Hemoglobin: 9.5 g/dL — ABNORMAL LOW (ref 11.7–15.5)
MCH: 27.9 pg (ref 27.0–33.0)
MCHC: 32.4 g/dL (ref 32.0–36.0)
MCV: 85.9 fL (ref 80.0–100.0)
MPV: 11.9 fL (ref 7.5–12.5)
Monocytes Relative: 5.4 %
Neutro Abs: 5636 {cells}/uL (ref 1500–7800)
Neutrophils Relative %: 77.2 %
Platelets: 234 10*3/uL (ref 140–400)
RBC: 3.41 10*6/uL — ABNORMAL LOW (ref 3.80–5.10)
RDW: 15.2 % — ABNORMAL HIGH (ref 11.0–15.0)
Total Lymphocyte: 13.8 %
WBC: 7.3 10*3/uL (ref 3.8–10.8)

## 2023-08-31 LAB — BASIC METABOLIC PANEL WITH GFR
BUN/Creatinine Ratio: 21 (calc) (ref 6–22)
BUN: 26 mg/dL — ABNORMAL HIGH (ref 7–25)
CO2: 25 mmol/L (ref 20–32)
Calcium: 8.4 mg/dL — ABNORMAL LOW (ref 8.6–10.4)
Chloride: 102 mmol/L (ref 98–110)
Creat: 1.24 mg/dL — ABNORMAL HIGH (ref 0.60–1.00)
Glucose, Bld: 130 mg/dL — ABNORMAL HIGH (ref 65–99)
Potassium: 4.4 mmol/L (ref 3.5–5.3)
Sodium: 135 mmol/L (ref 135–146)
eGFR: 45 mL/min/{1.73_m2} — ABNORMAL LOW (ref >=60)

## 2023-09-05 ENCOUNTER — Telehealth: Payer: Self-pay | Admitting: Pharmacy Technician

## 2023-09-05 DIAGNOSIS — Z5986 Financial insecurity: Secondary | ICD-10-CM

## 2023-09-05 NOTE — Progress Notes (Signed)
 Pharmacy Medication Assistance Program Note    09/05/2023  Patient ID: ESBEIDY MCLAINE, female   DOB: 13-Sep-1945, 78 y.o.   MRN: [id number]     07/11/2023 08/02/2023 09/05/2023  Outreach Medication One  Initial Outreach Date (Medication One) 07/11/2023    Manufacturer Medication One [employer] Trulicity     Dose of Humalog  Mix 75/25 100 units/ml    Dose of Trulicity  3mg /0.75ml    Type of Assistance Manufacturer Assistance    Date Application Sent to Patient 07/13/2023    Application Items Requested Application;Proof of Income;Other    Date Application Sent to Prescriber 07/13/2023    Name of Prescriber Dorette Loron    Date Application Received From Patient  07/27/2023   Application Items Received From Patient  Application;Proof of Income;Other   Date Application Received From Provider  07/13/2023   Date Application Submitted to Manufacturer  08/02/2023   Method Application Sent to Manufacturer  Fax   Patient Assistance Determination   Approved  Approval Start Date   08/24/2023  Approval End Date   08/22/2024  Additional Outreach Contact   Provider  Contacted Provider   Message     Care coordination call placed to [employer] in regard to Humalog  Mix 75/25 and Truicity application. Spoke to Rivereno who informs patient is APPROVED 08/24/23-08/22/24. Medication should auto fill and ship to patient's home address on the application. Patient may call [employer] at any time to check on his next shipment by calling [phone]. Will send in basket to [facility] Pharm[facility] to discuss at his next office visit with her.   Signature   Kate Marzette Sola [facility]  Office: [phone] Fax: [phone] Email: [email or web address]

## 2023-09-07 ENCOUNTER — Other Ambulatory Visit: Payer: Self-pay | Admitting: Pharmacist

## 2023-09-07 NOTE — Progress Notes (Signed)
 09/07/2023 Name: Amber Reyes MRN: [id number] DOB: 06/11/1946  Chief Complaint  Patient presents with   Medication Management   Medication Assistance    Amber Reyes is a 78 y.o. year old female who presented for a telephone visit.   They were referred to the pharmacist for assistance in managing medication access.      Subjective:   Care Team: Primary Care Provider: Sowles, Krichna, MD ; Next Scheduled Visit: 10/25/2023 Cardiologist: Wenona Hamilton, MD; Next Scheduled Visit: 11/03/2023 Nephrologist: Champ Coma, MD; Next Scheduled Visit: 10/18/2023    Medication Access/Adherence  Current Pharmacy:  [id number] - [place], [place] - [address] AT [facility] [address] [facility] [address] [place] [address] Phone: [phone] Fax: [phone]  [facility] - [place], [place] - [address] [place] [address] Phone: [phone] Fax: [phone]  [facility] - [place], [place] - [address] [place] [address] Phone: [phone] Fax: [phone]  [facility] - [address] [place] [address] Phone: [phone] Fax: [phone]  [facility] - [place], [place] - [address] [place] [address] Phone: [phone] Fax: [phone]   Patient reports affordability concerns with their medications: No  Patient reports access/transportation concerns to their pharmacy: No  Patient reports adherence concerns with their medications:  No     Today reports that she completed Zpak as prescribed by PCP on 1/7 and symptoms have improved, but still having productive cough and congestion - Plans to contact PCP to follow up regarding symptoms today - Reports staying well hydrated   Diabetes:   Current medications:  - Trulicity  3 mg  weekly - Humalog  Mix 75/25 Kwikpen - 12 units twice daily before breakfast and supper   Current glucose readings: morning fasting readings ranging: 86-125; today: 118   Patient denies hypoglycemic s/sx including dizziness, shakiness, sweating. Patient    Statin therapy: pravastatin  80 mg daily   Current medication access support: enrolled in patient assistance for Trulicity  and Humalog  Mix from [employer] patient assistance through 08/22/2024     Hypertension/Atrial Fibrillation/CHF:   Current medications:  Rate Control: diltiazem  CD 240 mg daily Anticoagulation Regimen: Eliquis  5 mg twice daily ACEi/ARB/ARNI: irbesartan  300 mg daily Diuretic: furosemide  40 mg - 1/2 tablet (20 mg) twice daily   Denies checking home blood pressure recently as needing to obtain a new monitor    Patient denies hypotensive s/sx including dizziness, lightheadedness.    Current medication access support: none; previously enrolled in patient assistance for Eliquis  from [employer] in 2024     Asthma/COPD/Allergic Rhinitis:   Current medications: - Wixela inhaler 250-50 mcg/act - 1 puff twice daily - albuterol  HFA inhaler - 2 puffs every 6 hours as needed for wheezing/shortness of breath - fluticasone  nasal spray - 2 sprays in each nostril daily - loratadine  10 mg daily   Confirms rinses and spits out after each use of Wixela inhaler   Objective:  Lab Results  Component Value Date   HGBA1C 7.2 (A) 06/24/2023    Lab Results  Component Value Date   CREATININE 1.24 (H) 08/30/2023   BUN 26 (H) 08/30/2023   NA 135 08/30/2023   K 4.4 08/30/2023   CL 102 08/30/2023   CO2 25 08/30/2023    Lab Results  Component Value Date   CHOL 137 08/25/2022   HDL 48 (L) 08/25/2022   LDLCALC 70 08/25/2022   TRIG 102 08/25/2022   CHOLHDL 2.9 08/25/2022   BP Readings from Last 3 Encounters:  08/30/23 134/76  08/02/23 (!) 118/41  06/24/23 128/78   Pulse Readings from Last 3 Encounters:  08/30/23 72  08/02/23  81  06/24/23 98    Current Outpatient Medications on File Prior to Visit  Medication Sig Dispense Refill   acetaminophen  (TYLENOL ) 650 MG CR tablet Take 650 mg by mouth in the morning and at bedtime.     albuterol  (VENTOLIN  HFA) 108 (90 Base) MCG/ACT inhaler Inhale 2 puffs into the lungs every 6 (six) hours as needed for wheezing or shortness of breath. 17 each 0   Alcohol Swabs (DROPSAFE ALCOHOL PREP) 70 % PADS USE AS DIRECTED 300 each 3   allopurinol  (ZYLOPRIM ) 100 MG tablet TAKE 1 TABLET TWICE DAILY 180 tablet 3   apixaban  (ELIQUIS ) 5 MG TABS tablet Take 1 tablet (5 mg total) by mouth 2 (two) times daily. 7 tablet 0   benzonatate  (TESSALON ) 100 MG capsule Take 1-2 capsules (100-200 mg total) by mouth 3 (three) times daily as needed. 40 capsule 0   Blood Glucose Calibration (TRUE METRIX LEVEL 1) Low SOLN 1 each by In Vitro route once a week. 1 each 1   Blood Glucose Monitoring Suppl (TRUE METRIX AIR GLUCOSE METER) w/Device KIT Inject 1 each into the skin 4 (four) times daily as needed. Check fsbs 4 times daily E11.22 1 kit 0   chlorpheniramine-HYDROcodone (TUSSIONEX) 10-8 MG/5ML Take 5 mLs by mouth at bedtime as needed for cough. 115 mL 0   Cholecalciferol  (VITAMIN D ) 2000 UNITS tablet Take 2,000 Units by mouth daily.     colchicine  (COLCRYS ) 0.6 MG tablet Take 1-2 tablets (0.6-1.2 mg total) by mouth daily as needed. 30 tablet 0   diltiazem  (CARDIZEM  CD) 240 MG 24 hr capsule TAKE 1 CAPSULE EVERY DAY 90 capsule 3   Dulaglutide  (TRULICITY ) 3 MG/0.5ML SOPN Inject 3 mg as directed once a week. 6 mL 3   Elastic Bandages & Supports (MEDICAL COMPRESSION STOCKINGS) MISC 2 each by Does not apply route daily. 2 each 2   Ferrous Sulfate (IRON  SUPPLEMENT PO) Take by mouth.     fluticasone  (FLONASE ) 50 MCG/ACT nasal spray USE 2 SPRAYS IN EACH NOSTRIL EVERY DAY 48 g 3   fluticasone -salmeterol (WIXELA INHUB) 250-50 MCG/ACT AEPB Inhale 1 puff into the lungs in the morning and at bedtime. 180 each 1    furosemide  (LASIX ) 40 MG tablet Take 0.5 tablets (20 mg total) by mouth 2 (two) times daily. 180 tablet 1   HUMALOG  MIX 75/25 KWIKPEN (75-25) 100 UNIT/ML KwikPen DIAL AND INJECT 10 UNITS UNDER THE SKIN TWICE DAILY WITH A MEAL. MAX DAILY DOSE 20 UNITS. DISCARD USED PEN AFTER 10 DAYS. 45 mL 0   irbesartan  (AVAPRO ) 300 MG tablet TAKE 1 TABLET EVERY DAY 90 tablet 1   loratadine  (CLARITIN ) 10 MG tablet Take 1 tablet (10 mg total) by mouth daily. 100 tablet 1   Magnesium Chloride 64 MG TBEC Take 1 tablet by mouth daily.     pravastatin  (PRAVACHOL ) 80 MG tablet Take 1 tablet (80 mg total) by mouth every evening. 90 tablet 1   TRUE METRIX BLOOD GLUCOSE TEST test strip CHECK BLOOD SUGAR FOUR TIMES DAILY 400 strip 3   TRUEplus Lancets 33G MISC TEST BLOOD SUGAR THREE TIMES DAILY AS NEEDED 300 each 3  No current facility-administered medications on file prior to visit.        Assessment/Plan:   Encourage patient to contact office to follow up with PCP  Diabetes: - Reviewed goal A1c, goal fasting, and goal 2 hour post prandial glucose - Recommend to check glucose, keep log of results and have this record to review at upcoming medical appointments. Patient to contact provider office sooner if needed for readings outside of established parameters or symptoms - Patient to follow up with [employer] patient assistance as needed for refills of Trulicity  and Humalog  Mix - Review with patient discard date for Humalog  Mix. Per manufacturer, Humalog  Mix KwikPen can only be used for a total of 10 days including both not in-use (unopened) and in-use (opened) storage time.   Hypertension/Atrial Fibrillation/CHF: - Reviewed long term cardiovascular and renal outcomes of uncontrolled blood pressure - Reviewed appropriate blood pressure monitoring technique and reviewed goal blood pressure.  - Recommend to monitor home blood pressure, keep log of results and have this record to review at upcoming medical appointments.  Patient to contact provider office sooner if needed for readings outside of established parameters or symptoms   Asthma/COPD/Allergic Rhinitis: - Reviewed appropriate inhaler technique.    Follow Up Plan: Clinical Pharmacist will follow up with patient by telephone on 12/07/2023 at 8:30 AM     Arthur Lash, PharmD, [facility] [phone]

## 2023-09-08 ENCOUNTER — Other Ambulatory Visit: Payer: Self-pay | Admitting: Family Medicine

## 2023-09-08 DIAGNOSIS — Z794 Long term (current) use of insulin: Secondary | ICD-10-CM

## 2023-09-11 ENCOUNTER — Other Ambulatory Visit: Payer: Self-pay | Admitting: Family Medicine

## 2023-09-11 DIAGNOSIS — E1122 Type 2 diabetes mellitus with diabetic chronic kidney disease: Secondary | ICD-10-CM

## 2023-09-11 DIAGNOSIS — E1129 Type 2 diabetes mellitus with other diabetic kidney complication: Secondary | ICD-10-CM

## 2023-09-12 ENCOUNTER — Other Ambulatory Visit: Payer: Self-pay | Admitting: Family Medicine

## 2023-09-12 ENCOUNTER — Encounter: Payer: Self-pay | Admitting: Family Medicine

## 2023-09-12 DIAGNOSIS — R051 Acute cough: Secondary | ICD-10-CM

## 2023-09-12 MED ORDER — HYDROCOD POLI-CHLORPHE POLI ER 10-8 MG/5ML PO SUER: 5.0000 mL | Freq: Every evening | ORAL | 0 refills | Status: DC | PRN

## 2023-09-12 NOTE — Patient Instructions (Signed)
Goals Addressed             This Visit's Progress    Pharmacy Goals       If you need to reach out to patient assistance programs regarding refills or to find out the status of your application, you can do so by calling:  Julious Oka [phone]   Please feel free to give me a call if needed for any medication related questions or concerns.     Thank you!   Estelle Grumbles, PharmD, [facility] [phone]

## 2023-09-19 ENCOUNTER — Telehealth: Payer: Self-pay | Admitting: Family Medicine

## 2023-09-19 ENCOUNTER — Other Ambulatory Visit: Payer: Self-pay | Admitting: Pharmacist

## 2023-09-19 NOTE — Telephone Encounter (Signed)
Copied from CRM [id number]. Topic: General - Other >> Sep 19, 2023 12:20 PM Ja-Kwan M wrote: Reason for CRM: Pt stated that she was returning call to [facility] in the pharmacy. Pt requests call back.

## 2023-09-19 NOTE — Telephone Encounter (Signed)
Please advise

## 2023-09-19 NOTE — Progress Notes (Signed)
Pharmacy Quality Measure Review  This patient is appearing on a report for adherence measure for cholesterol (statin) medications this calendar year.   Medication: pravastatin 80 mg Last fill date: 01/10/2023 for 90 day supply  Reviewed medication indication, dosing, and goals of therapy.   Patient is not currently home, but states that she will review her medication bottles when she gets home as she recalls that she has been taking this daily. Note patient uses weekly pillbox to organizes her medications. Confirms will contact [facility] if needed for refill of her pravastatin  Estelle Grumbles, PharmD, [facility] [phone]

## 2023-10-10 DIAGNOSIS — E559 Vitamin D deficiency, unspecified: Secondary | ICD-10-CM | POA: Diagnosis not present

## 2023-10-10 DIAGNOSIS — N183 Chronic kidney disease, stage 3 unspecified: Secondary | ICD-10-CM | POA: Diagnosis not present

## 2023-10-10 DIAGNOSIS — E1322 Other specified diabetes mellitus with diabetic chronic kidney disease: Secondary | ICD-10-CM | POA: Diagnosis not present

## 2023-10-10 DIAGNOSIS — N185 Chronic kidney disease, stage 5: Secondary | ICD-10-CM | POA: Diagnosis not present

## 2023-10-10 DIAGNOSIS — D631 Anemia in chronic kidney disease: Secondary | ICD-10-CM | POA: Diagnosis not present

## 2023-10-10 LAB — MICROALBUMIN / CREATININE URINE RATIO: Microalb Creat Ratio: 1.107

## 2023-10-18 DIAGNOSIS — D631 Anemia in chronic kidney disease: Secondary | ICD-10-CM | POA: Diagnosis not present

## 2023-10-18 DIAGNOSIS — N1831 Chronic kidney disease, stage 3a: Secondary | ICD-10-CM | POA: Diagnosis not present

## 2023-10-18 DIAGNOSIS — I10 Essential (primary) hypertension: Secondary | ICD-10-CM | POA: Diagnosis not present

## 2023-10-19 NOTE — Addendum Note (Signed)
 Addended by: Ruel Favors on: 10/19/2023 12:47 PM   Modules accepted: Level of Service

## 2023-10-24 ENCOUNTER — Inpatient Hospital Stay: Payer: Medicare PPO

## 2023-10-24 ENCOUNTER — Inpatient Hospital Stay: Payer: Medicare PPO | Attending: Oncology | Admitting: Oncology

## 2023-10-24 ENCOUNTER — Encounter: Payer: Self-pay | Admitting: Oncology

## 2023-10-24 VITALS — BP 184/81 | HR 63 | Temp 97.4°F | Resp 18 | Ht 70.0 in | Wt 366.0 lb

## 2023-10-24 DIAGNOSIS — N1831 Chronic kidney disease, stage 3a: Secondary | ICD-10-CM

## 2023-10-24 DIAGNOSIS — Z7901 Long term (current) use of anticoagulants: Secondary | ICD-10-CM

## 2023-10-24 DIAGNOSIS — E1122 Type 2 diabetes mellitus with diabetic chronic kidney disease: Secondary | ICD-10-CM | POA: Diagnosis not present

## 2023-10-24 DIAGNOSIS — I129 Hypertensive chronic kidney disease with stage 1 through stage 4 chronic kidney disease, or unspecified chronic kidney disease: Secondary | ICD-10-CM | POA: Diagnosis not present

## 2023-10-24 DIAGNOSIS — Z79899 Other long term (current) drug therapy: Secondary | ICD-10-CM | POA: Diagnosis not present

## 2023-10-24 DIAGNOSIS — Z8249 Family history of ischemic heart disease and other diseases of the circulatory system: Secondary | ICD-10-CM

## 2023-10-24 DIAGNOSIS — I4891 Unspecified atrial fibrillation: Secondary | ICD-10-CM | POA: Diagnosis not present

## 2023-10-24 DIAGNOSIS — D631 Anemia in chronic kidney disease: Secondary | ICD-10-CM | POA: Diagnosis present

## 2023-10-24 DIAGNOSIS — Z8052 Family history of malignant neoplasm of bladder: Secondary | ICD-10-CM | POA: Diagnosis not present

## 2023-10-24 NOTE — Progress Notes (Signed)
 Hematology/Oncology Consult note [phone] [phone]        REFERRING PROVIDER: Alba Cory, MD   CHIEF COMPLAINTS/REASON FOR VISIT:  Evaluation of anemia in chronic kidney disease.   ASSESSMENT & PLAN:   Anemia due to stage 3a chronic kidney disease (HCC) Labs are reviewed and discussed with patient. Anemia is likely due to chronic kidney disease. I recommend IV Venofer treatments to further increase iron stores. I discussed about about the potential risks including but not limited to allergic reactions/infusion reactions including anaphylactic reactions, diarrhea, phlebitis, high blood pressure, wheezing, SOB, skin rash, weight gain,dark urine, leg swelling, back pain, headache, nausea and fatigue, etc. patient agrees with the plan.  Plan IV venofer weekly x 3 Recommend patient follow-up in 2 months to repeat iron panel, CBC for evaluation of treatment response.  I will also check protein electrophoresis, rule out other etiology for anemia.   Orders Placed This Encounter  Procedures   CBC with Differential (Cancer Center Only)    Standing Status:   Future    Expected Date:   12/19/2023    Expiration Date:   10/23/2024   Kappa/lambda light chains    Standing Status:   Future    Expected Date:   12/19/2023    Expiration Date:   10/23/2024   Multiple Myeloma Panel (SPEP&IFE w/QIG)    Standing Status:   Future    Expected Date:   12/19/2023    Expiration Date:   10/23/2024   Iron and TIBC    Standing Status:   Future    Expected Date:   12/19/2023    Expiration Date:   10/23/2024   Ferritin    Standing Status:   Future    Expected Date:   12/19/2023    Expiration Date:   10/23/2024   Retic Panel    Standing Status:   Future    Expected Date:   12/19/2023    Expiration Date:   10/23/2024   Follow-up in 2 months. All questions were answered. The patient knows to call the clinic with any problems, questions or concerns.  Rickard Patience, MD, PhD [facility]  Hematology Oncology 10/24/2023   HISTORY OF PRESENTING ILLNESS:   Amber Reyes is a  78 y.o.  female with PMH listed below was seen in consultation at the request of  Alba Cory, MD  for evaluation of anemia and chronic kidney disease.  Patient has a chronic anemia history.  Baseline hemoglobin is above 10. Recent blood work showed hemoglobin has dropped below 10 and has been in the mid 9s. Patient reports chronic fatigue.  She has chronic kidney disease stage IIIa. Denies hematochezia, hematuria, hematemesis, epistaxis, black tarry stool or easy bruising.  Last colonoscopy was in December 2024. Patient takes Eliquis for atrial fibrillation.  MEDICAL HISTORY:  Past Medical History:  Diagnosis Date   Allergic rhinitis, cause unspecified    Anxiety    Chronic diastolic CHF (congestive heart failure) (HCC)    CKD (chronic kidney disease), stage III (HCC)    Edema    Essential hypertension, benign    Gout, unspecified    Heart murmur    as child   Hyperlipidemia    Lipoma of unspecified site    Other and unspecified disc disorder of lumbar region    Other general symptoms(780.99)    PAC (premature atrial contraction)    Renal insufficiency    Restless legs syndrome (RLS)    Sebaceous cyst    Super  obesity    Type II or unspecified type diabetes mellitus without mention of complication, uncontrolled    Unspecified asthma(493.90)    Unspecified disease of pericardium    Unspecified sleep apnea    Unspecified vitamin D deficiency    Vaginitis and vulvovaginitis, unspecified    Venous insufficiency     SURGICAL HISTORY: Past Surgical History:  Procedure Laterality Date   CARDIAC CATHETERIZATION  2002   [facility]   CATARACT EXTRACTION W/PHACO Right 09/15/2021   Procedure: CATARACT EXTRACTION PHACO AND INTRAOCULAR LENS PLACEMENT (IOC) RIGHT 10.19 01:01.8;  Surgeon: Galen Manila, MD;  Location: [facility];  Service: Ophthalmology;  Laterality: Right;    CATARACT EXTRACTION W/PHACO Left 09/29/2021   Procedure: CATARACT EXTRACTION PHACO AND INTRAOCULAR LENS PLACEMENT (IOC) LEFT DIABETIC 11.40 01:21.2;  Surgeon: Galen Manila, MD;  Location: [facility];  Service: Ophthalmology;  Laterality: Left;   COLONOSCOPY WITH PROPOFOL N/A 09/25/2018   Procedure: COLONOSCOPY WITH PROPOFOL;  Surgeon: Wyline Mood, MD;  Location: [facility] ENDOSCOPY;  Service: Gastroenterology;  Laterality: N/A;   COLONOSCOPY WITH PROPOFOL N/A 04/13/2019   Procedure: COLONOSCOPY WITH PROPOFOL;  Surgeon: Wyline Mood, MD;  Location: [facility] END[facility]COPY;  Service: Gastroenterology;  Laterality: N/A;   COLONOSCOPY WITH PROPOFOL N/A 06/27/2020   Procedure: COLONOSCOPY WITH PROPOFOL;  Surgeon: Wyline Mood, MD;  Location: [facility] ENDOSCOPY;  Service: Gastroenterology;  Laterality: N/A;   COLONOSCOPY WITH PROPOFOL N/A 08/02/2023   Procedure: COLONOSCOPY WITH PROPOFOL;  Surgeon: Wyline Mood, MD;  Location: [facility] ENDOSCOPY;  Service: Gastroenterology;  Laterality: N/A;   PERICARDIUM SURGERY     POLYPECTOMY  08/02/2023   Procedure: POLYPECTOMY;  Surgeon: Wyline Mood, MD;  Location: [facility] ENDOSCOPY;  Service: Gastroenterology;;    SOCIAL HISTORY: Social History   Socioeconomic History   Marital status: Married    Spouse name: Not on file   Number of children: 3   Years of education: Not on file   Highest education level: Associate degree: academic program  Occupational History   Occupation: retired  Tobacco Use   Smoking status: Never   Smokeless tobacco: Never   Tobacco comments:    n/a  Vaping Use   Vaping status: Never Used  Substance and Sexual Activity   Alcohol use: No    Alcohol/week: 0.0 standard drinks of alcohol   Drug use: No   Sexual activity: Not Currently  Other Topics Concern   Not on file  Social History Narrative   Not on file   Social Drivers of Health   Financial Resource Strain: Low Risk  (07/07/2023)   Overall Financial Resource Strain (CARDIA)     Difficulty of Paying Living Expenses: Not very hard  Food Insecurity: No Food Insecurity (07/07/2023)   Hunger Vital Sign    Worried About Running Out of Food in the Last Year: Never true    Ran Out of Food in the Last Year: Never true  Transportation Needs: No Transportation Needs (06/24/2022)   PRAPARE - Administrator, Civil Service (Medical): No    Lack of Transportation (Non-Medical): No  Physical Activity: Inactive (07/07/2023)   Exercise Vital Sign    Days of Exercise per Week: 0 days    Minutes of Exercise per Session: 0 min  Stress: No Stress Concern Present (07/07/2023)   [employer] of Occupational Health - Occupational Stress Questionnaire    Feeling of Stress : Not at all  Social Connections: Moderately Isolated (07/07/2023)   Social Connection and Isolation Panel [NHANES]    Frequency  of Communication with Friends and Family: More than three times a week    Frequency of Social Gatherings with Friends and Family: Twice a week    Attends Religious Services: Never    Database administrator or Organizations: No    Attends Banker Meetings: Never    Marital Status: Married  Catering manager Violence: Not At Risk (07/07/2023)   Humiliation, Afraid, Rape, and Kick questionnaire    Fear of Current or Ex-Partner: No    Emotionally Abused: No    Physically Abused: No    Sexually Abused: No    FAMILY HISTORY: Family History  Problem Relation Age of Onset   Heart attack Mother    Hypertension Mother    Bladder Cancer Father    Breast cancer Neg Hx     ALLERGIES:  is allergic to farxiga [dapagliflozin].  MEDICATIONS:  Current Outpatient Medications  Medication Sig Dispense Refill   acetaminophen (TYLENOL) 650 MG CR tablet Take 650 mg by mouth in the morning and at bedtime.     albuterol (VENTOLIN HFA) 108 (90 Base) MCG/ACT inhaler Inhale 2 puffs into the lungs every 6 (six) hours as needed for wheezing or shortness of breath. 17 each 0    Alcohol Swabs (DROPSAFE ALCOHOL PREP) 70 % PADS USE AS DIRECTED 300 each 3   allopurinol (ZYLOPRIM) 100 MG tablet TAKE 1 TABLET TWICE DAILY 180 tablet 3   apixaban (ELIQUIS) 5 MG TABS tablet Take 1 tablet (5 mg total) by mouth 2 (two) times daily. 7 tablet 0   benzonatate (TESSALON) 100 MG capsule Take 1-2 capsules (100-200 mg total) by mouth 3 (three) times daily as needed. 40 capsule 0   Blood Glucose Calibration (TRUE METRIX LEVEL 1) Low SOLN 1 each by In Vitro route once a week. 1 each 1   Blood Glucose Monitoring Suppl (TRUE METRIX AIR GLUCOSE METER) w/Device KIT Inject 1 each into the skin 4 (four) times daily as needed. Check fsbs 4 times daily E11.22 1 kit 0   chlorpheniramine-HYDROcodone (TUSSIONEX) 10-8 MG/5ML Take 5 mLs by mouth at bedtime as needed for cough. 115 mL 0   Cholecalciferol (VITAMIN D) 2000 UNITS tablet Take 2,000 Units by mouth daily.     colchicine (COLCRYS) 0.6 MG tablet Take 1-2 tablets (0.6-1.2 mg total) by mouth daily as needed. 30 tablet 0   diltiazem (CARDIZEM CD) 240 MG 24 hr capsule TAKE 1 CAPSULE EVERY DAY 90 capsule 3   DROPLET PEN NEEDLES 30G X 8 MM MISC USE TWICE DAILY WITH INSULIN 200 each 3   Elastic Bandages & Supports (MEDICAL COMPRESSION STOCKINGS) MISC 2 each by Does not apply route daily. 2 each 2   Ferrous Sulfate (IRON SUPPLEMENT PO) Take 1 tablet by mouth daily.     fluticasone (FLONASE) 50 MCG/ACT nasal spray USE 2 SPRAYS IN EACH NOSTRIL EVERY DAY 48 g 3   fluticasone-salmeterol (WIXELA INHUB) 250-50 MCG/ACT AEPB Inhale 1 puff into the lungs in the morning and at bedtime. 180 each 1   furosemide (LASIX) 40 MG tablet Take 0.5 tablets (20 mg total) by mouth 2 (two) times daily. 180 tablet 1   HUMALOG MIX 75/25 KWIKPEN (75-25) 100 UNIT/ML KwikPen DIAL AND INJECT 10 UNITS UNDER THE SKIN TWICE DAILY WITH A MEAL. MAX DAILY DOSE 20 UNITS. DISCARD USED PEN AFTER 10 DAYS. 45 mL 0   irbesartan (AVAPRO) 300 MG tablet TAKE 1 TABLET EVERY DAY 90 tablet 1    loratadine (CLARITIN) 10 MG tablet  Take 1 tablet (10 mg total) by mouth daily. 100 tablet 1   Magnesium Chloride 64 MG TBEC Take 1 tablet by mouth daily.     pravastatin (PRAVACHOL) 80 MG tablet Take 1 tablet (80 mg total) by mouth every evening. 90 tablet 1   TRUE METRIX BLOOD GLUCOSE TEST test strip CHECK BLOOD SUGAR FOUR TIMES DAILY 400 strip 3   TRUEplus Lancets 33G MISC TEST BLOOD SUGAR THREE TIMES DAILY AS NEEDED 300 each 3   TRULICITY 3 MG/0.5ML SOAJ INJECT 3 MG (0.5 ML) UNDER THE SKIN ONCE A WEEK 8 mL 0   No current facility-administered medications for this visit.    Review of Systems  Constitutional:  Positive for fatigue. Negative for appetite change, chills and fever.  HENT:   Negative for hearing loss and voice change.   Eyes:  Negative for eye problems.  Respiratory:  Negative for chest tightness and cough.   Cardiovascular:  Negative for chest pain.  Gastrointestinal:  Negative for abdominal distention, abdominal pain and blood in stool.  Endocrine: Negative for hot flashes.  Genitourinary:  Negative for difficulty urinating and frequency.   Musculoskeletal:  Negative for arthralgias.  Skin:  Negative for itching and rash.  Neurological:  Negative for extremity weakness.  Hematological:  Negative for adenopathy.  Psychiatric/Behavioral:  Negative for confusion.    PHYSICAL EXAMINATION: ECOG PERFORMANCE STATUS: 1 - Symptomatic but completely ambulatory Vitals:   10/24/23 1111 10/24/23 1127  BP: (!) 188/75 (!) 184/81  Pulse: 65 63  Resp: 18   Temp: (!) 97.4 F (36.3 C)    Filed Weights   10/24/23 1111  Weight: (!) 366 lb (166 kg)    Physical Exam Constitutional:      General: She is not in acute distress.    Appearance: She is obese.  HENT:     Head: Normocephalic and atraumatic.  Eyes:     General: No scleral icterus. Cardiovascular:     Rate and Rhythm: Normal rate. Rhythm irregular.     Heart sounds: Murmur heard.  Pulmonary:     Effort: Pulmonary  effort is normal. No respiratory distress.     Breath sounds: No wheezing.  Abdominal:     General: Bowel sounds are normal. There is no distension.     Palpations: Abdomen is soft.  Musculoskeletal:        General: No deformity. Normal range of motion.     Cervical back: Normal range of motion and neck supple.  Skin:    General: Skin is warm and dry.     Findings: No erythema or rash.  Neurological:     Mental Status: She is alert and oriented to person, place, and time. Mental status is at baseline.  Psychiatric:        Mood and Affect: Mood normal.     LABORATORY DATA:  I have reviewed the data as listed    Latest Ref Rng & Units 08/30/2023   11:33 AM 08/25/2022   10:03 AM 07/14/2021    9:01 AM  CBC  WBC 3.8 - 10.8 Thousand/uL 7.3  8.2  8.9   Hemoglobin 11.7 - 15.5 g/dL 9.5  56.2  13.0   Hematocrit 35.0 - 45.0 % 29.3  32.9  33.4   Platelets 140 - 400 Thousand/uL 234  224  228       Latest Ref Rng & Units 08/30/2023   11:33 AM 08/25/2022   10:03 AM 02/25/2022    2:36 PM  CMP  Glucose 65 - 99 mg/dL 782  956  213   BUN 7 - 25 mg/dL 26  33  31   Creatinine 0.60 - 1.00 mg/dL 0.86  5.78  4.69   Sodium 135 - 146 mmol/L 135  138  140   Potassium 3.5 - 5.3 mmol/L 4.4  4.6  4.3   Chloride 98 - 110 mmol/L 102  103  106   CO2 20 - 32 mmol/L 25  27  26    Calcium 8.6 - 10.4 mg/dL 8.4  8.7  8.9   Total Protein 6.1 - 8.1 g/dL  7.1    Total Bilirubin 0.2 - 1.2 mg/dL  0.5    AST 10 - 35 U/L  14    ALT 6 - 29 U/L  7        RADIOGRAPHIC STUDIES: I have personally reviewed the radiological images as listed and agreed with the findings in the report. DG Chest 2 View Result Date: 09/05/2023 CLINICAL DATA:  78 year old female with productive cough EXAM: CHEST - 2 VIEW COMPARISON:  12/30/2016 FINDINGS: Cardiomediastinal silhouette unchanged. Bronchial wall thickening.  No pneumothorax or pleural effusion. No significant interlobular septal thickening. No displaced fracture IMPRESSION:  Bronchial wall thickening suggesting bronchitis. Electronically Signed   By: Gilmer Mor D.O.   On: 09/05/2023 09:17

## 2023-10-24 NOTE — Assessment & Plan Note (Addendum)
 Labs are reviewed and discussed with patient. Anemia is likely due to chronic kidney disease. I recommend IV Venofer treatments to further increase iron stores. I discussed about about the potential risks including but not limited to allergic reactions/infusion reactions including anaphylactic reactions, diarrhea, phlebitis, high blood pressure, wheezing, SOB, skin rash, weight gain,dark urine, leg swelling, back pain, headache, nausea and fatigue, etc. patient agrees with the plan.  Plan IV venofer weekly x 3 Recommend patient follow-up in 2 months to repeat iron panel, CBC for evaluation of treatment response.  I will also check protein electrophoresis, rule out other etiology for anemia.

## 2023-10-25 ENCOUNTER — Ambulatory Visit (INDEPENDENT_AMBULATORY_CARE_PROVIDER_SITE_OTHER): Payer: Medicare PPO | Admitting: Family Medicine

## 2023-10-25 ENCOUNTER — Encounter: Payer: Self-pay | Admitting: Family Medicine

## 2023-10-25 VITALS — BP 128/76 | HR 74 | Resp 16 | Ht 70.0 in | Wt 364.6 lb

## 2023-10-25 DIAGNOSIS — I700 Atherosclerosis of aorta: Secondary | ICD-10-CM

## 2023-10-25 DIAGNOSIS — I482 Chronic atrial fibrillation, unspecified: Secondary | ICD-10-CM

## 2023-10-25 DIAGNOSIS — E1129 Type 2 diabetes mellitus with other diabetic kidney complication: Secondary | ICD-10-CM

## 2023-10-25 DIAGNOSIS — I110 Hypertensive heart disease with heart failure: Secondary | ICD-10-CM

## 2023-10-25 DIAGNOSIS — N1831 Chronic kidney disease, stage 3a: Secondary | ICD-10-CM

## 2023-10-25 DIAGNOSIS — N2581 Secondary hyperparathyroidism of renal origin: Secondary | ICD-10-CM

## 2023-10-25 DIAGNOSIS — Z794 Long term (current) use of insulin: Secondary | ICD-10-CM

## 2023-10-25 DIAGNOSIS — I77819 Aortic ectasia, unspecified site: Secondary | ICD-10-CM

## 2023-10-25 DIAGNOSIS — E1169 Type 2 diabetes mellitus with other specified complication: Secondary | ICD-10-CM

## 2023-10-25 DIAGNOSIS — I272 Pulmonary hypertension, unspecified: Secondary | ICD-10-CM

## 2023-10-25 DIAGNOSIS — J4540 Moderate persistent asthma, uncomplicated: Secondary | ICD-10-CM

## 2023-10-25 DIAGNOSIS — L723 Sebaceous cyst: Secondary | ICD-10-CM

## 2023-10-25 LAB — POCT GLYCOSYLATED HEMOGLOBIN (HGB A1C): Hemoglobin A1C: 7.1 % — AB (ref 4.0–5.6)

## 2023-10-25 MED ORDER — PRAVASTATIN SODIUM 80 MG PO TABS: 80.0000 mg | ORAL_TABLET | Freq: Every evening | ORAL | 1 refills | Status: DC

## 2023-10-25 MED ORDER — FUROSEMIDE 20 MG PO TABS: 20.0000 mg | ORAL_TABLET | Freq: Two times a day (BID) | ORAL | 1 refills | Status: AC

## 2023-10-25 NOTE — Progress Notes (Signed)
 Name: Amber Reyes   MRN: [id number]    DOB: 11/21/1945   Date:10/25/2023       Progress Note  Subjective  Chief Complaint  Chief Complaint  Patient presents with   Medical Management of Chronic Issues    HTN- pt states has noticed Bp readings have been elevated for the past week   HPI   DMII with complications: She has been compliant with medication, she is on  Humalog 75/25 ( current taking  10 units twice daily)  since  Feb 2018, also started on Trulicity in 2017 HgbA1C was 6.5%  6.7% ,6.9%, 6.8 % , 7 %, 7%  7.6 %,it went up to 7.9 % ,6.9 % 7.2 % today is 7.1 % . She takes Avapro for microalbuminuria and CKI stage III - unable to take SGL2 agonist , she is on statin for dyslipidemia, she has diabetes associated with obesity. She states higher dose of Trulicity seems to not work as well for her. She is trying to eat healthier  Chronic anemia: seeing hematologist and is going to have iron infusion   HTN/CHF/Afib/Pulmonary hypertension/dilated aorta  : bp slightly up when she arrived, she state at home usually 130's/70's she is taking lasix 20 mg twice daily off potassium but takes magnesium. She denies chest pain no recent palpitation, found to have Afib on EKG done by Dr. Kirke Corin and takes Cardizem for rate control   She has orthopnea - uses two pillows , SOB is still present sometimes worse and today she used a wheelchair ( from the lobby) to come in the office, at home seats on a office chair with wheels to move around. We will try to get her a wheelchair for the house    Last Echo 03/2022  LV function is still normal, but still has left and right atrial dilation, pulmonary hypertension and grade II diastolic dysfunction She takes Eliquis 5 mg BID, and has been compliant with medication. She is up to date with Dr. Kirke Corin ( cardiologist office)    Hyperlipidemia: taking Pravastatin, we will continue current medication ,last LDL was  at goal , down to 70    CKIII a: she was recently seen by   [facility] nephrologist, last GFR stable,  denies  pruritis, she has good urine output, she has secondary hyperparathyroidism . Good urine output, recent urine micro done at [facility]   OSA: she was wearing CPAP machine every night, wakes up feeling rested. Unchanged    Morbid Obesity: she gets Trulicity  3 mg through [employer], weight has been stable. She cannot do much physical activity due to SOB but trying to eat more salads and vegetables and cutting down on starches    Asthma Moderate persistent/Chronic bronchitis: . She is compliant and uses Advair twice daily as prescribed.  No wheezing , she still coughs in am's but SOB is stable   Atherosclerosis of aorta and echo showed mild dilation of ascending aorta, continue statin and eliquis and pravastatin as prescribed, we will recheck lipid panel today     Chronic back pain: she cannot stand or walk for a long time because increases the pain, dull pain, no radiculitis. She states Tylenol controls symptoms . Stable    Skin problem: cyst on left side of neck, growing and sometimes it gets itchy . It looks like sebaceous cyst, and it is getting larger, she is ready to see a surgeon and have it removed   Patient Active Problem List   Diagnosis  Date Noted   Anemia due to stage 3a chronic kidney disease (HCC) 10/24/2023   Secondary hyperparathyroidism of renal origin (HCC) 08/25/2022   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   Localized, 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/22/2021   Pain in joint of left shoulder 04/22/2021   Tendonitis of shoulder, left 04/22/2021   Hypertension associated with stage 3 chronic kidney disease due to type 2 diabetes mellitus (HCC) 02/06/2020   History of colonic polyps    Polyp of colon    Thoracic aortic atherosclerosis (HCC) 12/30/2016   Controlled type 2 diabetes mellitus with microalbuminuria, with long-term current use of insulin (HCC) 12/23/2016   Venous insufficiency 05/21/2016   Hypertensive heart  disease 05/21/2016   Anemia in chronic kidney disease 12/22/2015   Pulmonary hypertension (HCC) 12/18/2015   Xanthelasma 04/15/2015   Anxiety 04/12/2015   Chronic kidney disease (CKD), stage III (moderate) (HCC) 04/12/2015   Edema of extremities 04/12/2015   Disc disorder of lumbar region 04/12/2015   Asthma, moderate persistent 04/12/2015   Morbid obesity (HCC) 04/12/2015   Obstructive apnea 04/12/2015   Restless leg 04/12/2015   Allergic rhinitis 04/12/2015   Vitamin D deficiency 04/12/2015   Controlled gout 04/12/2015   Premature atrial contractions 11/29/2013   Atrial fibrillation (HCC) 11/09/2013   Benign essential hypertension    Hyperlipidemia     Past Surgical History:  Procedure Laterality Date   CARDIAC CATHETERIZATION  2002   [facility]   CATARACT EXTRACTION W/PHACO Right 09/15/2021   Procedure: CATARACT EXTRACTION PHACO AND INTRAOCULAR LENS PLACEMENT (IOC) RIGHT 10.19 01:01.8;  Surgeon: Galen Manila, MD;  Location: [facility];  Service: Ophthalmology;  Laterality: Right;   CATARACT EXTRACTION W/PHACO Left 09/29/2021   Procedure: CATARACT EXTRACTION PHACO AND INTRAOCULAR LENS PLACEMENT (IOC) LEFT DIABETIC 11.40 01:21.2;  Surgeon: Galen Manila, MD;  Location: [facility];  Service: Ophthalmology;  Laterality: Left;   COLONOSCOPY WITH PROPOFOL N/A 09/25/2018   Procedure: COLONOSCOPY WITH PROPOFOL;  Surgeon: Wyline Mood, MD;  Location: [facility] ENDOSCOPY;  Service: Gastroenterology;  Laterality: N/A;   COLONOSCOPY WITH PROPOFOL N/A 04/13/2019   Procedure: COLONOSCOPY WITH PROPOFOL;  Surgeon: Wyline Mood, MD;  Location[facility]COPY;  Service: Gastroenterology;  Laterality: N/A;   COLONOSCOPY WITH PROPOFOL N/A 06/27/2020   Procedure: COLONOSCOPY WITH PROPOFOL;  Surgeon: Wyline Mood, MD;  Location: [facility] ENDOSCOPY;  Service: Gastroenterology;  Laterality: N/A;   COLONOSCOPY WITH PROPOFOL N/A 08/02/2023   Procedure: COLONOSCOPY WITH PROPOFOL;  Surgeon: Wyline Mood,  MD;  Location: [facility] ENDOSCOPY;  Service: Gastroenterology;  Laterality: N/A;   PERICARDIUM SURGERY     POLYPECTOMY  08/02/2023   Procedure: POLYPECTOMY;  Surgeon: Wyline Mood, MD;  Location: [facility] ENDOSCOPY;  Service: Gastroenterology;;    Family History  Problem Relation Age of Onset   Heart attack Mother    Hypertension Mother    Bladder Cancer Father    Breast cancer Neg Hx     Social History   Tobacco Use   Smoking status: Never   Smokeless tobacco: Never   Tobacco comments:    n/a  Substance Use Topics   Alcohol use: No    Alcohol/week: 0.0 standard drinks of alcohol     Current Outpatient Medications:    acetaminophen (TYLENOL) 650 MG CR tablet, Take 650 mg by mouth in the morning and at bedtime., Disp: , Rfl:    albuterol (VENTOLIN HFA) 108 (90 Base) MCG/ACT inhaler, Inhale 2 puffs into the lungs every 6 (six) hours as needed for wheezing or shortness  of breath., Disp: 17 each, Rfl: 0   Alcohol Swabs (DROPSAFE ALCOHOL PREP) 70 % PADS, USE AS DIRECTED, Disp: 300 each, Rfl: 3   allopurinol (ZYLOPRIM) 100 MG tablet, TAKE 1 TABLET TWICE DAILY, Disp: 180 tablet, Rfl: 3   apixaban (ELIQUIS) 5 MG TABS tablet, Take 1 tablet (5 mg total) by mouth 2 (two) times daily., Disp: 7 tablet, Rfl: 0   benzonatate (TESSALON) 100 MG capsule, Take 1-2 capsules (100-200 mg total) by mouth 3 (three) times daily as needed., Disp: 40 capsule, Rfl: 0   Blood Glucose Calibration (TRUE METRIX LEVEL 1) Low SOLN, 1 each by In Vitro route once a week., Disp: 1 each, Rfl: 1   Blood Glucose Monitoring Suppl (TRUE METRIX AIR GLUCOSE METER) w/Device KIT, Inject 1 each into the skin 4 (four) times daily as needed. Check fsbs 4 times daily E11.22, Disp: 1 kit, Rfl: 0   Cholecalciferol (VITAMIN D) 2000 UNITS tablet, Take 2,000 Units by mouth daily., Disp: , Rfl:    colchicine (COLCRYS) 0.6 MG tablet, Take 1-2 tablets (0.6-1.2 mg total) by mouth daily as needed., Disp: 30 tablet, Rfl: 0   diltiazem (CARDIZEM  CD) 240 MG 24 hr capsule, TAKE 1 CAPSULE EVERY DAY, Disp: 90 capsule, Rfl: 3   DROPLET PEN NEEDLES 30G X 8 MM MISC, USE TWICE DAILY WITH INSULIN, Disp: 200 each, Rfl: 3   Elastic Bandages & Supports (MEDICAL COMPRESSION STOCKINGS) MISC, 2 each by Does not apply route daily., Disp: 2 each, Rfl: 2   Ferrous Sulfate (IRON SUPPLEMENT PO), Take 1 tablet by mouth daily., Disp: , Rfl:    fluticasone (FLONASE) 50 MCG/ACT nasal spray, USE 2 SPRAYS IN EACH NOSTRIL EVERY DAY, Disp: 48 g, Rfl: 3   fluticasone-salmeterol (WIXELA INHUB) 250-50 MCG/ACT AEPB, Inhale 1 puff into the lungs in the morning and at bedtime., Disp: 180 each, Rfl: 1   HUMALOG MIX 75/25 KWIKPEN (75-25) 100 UNIT/ML KwikPen, DIAL AND INJECT 10 UNITS UNDER THE SKIN TWICE DAILY WITH A MEAL. MAX DAILY DOSE 20 UNITS. DISCARD USED PEN AFTER 10 DAYS., Disp: 45 mL, Rfl: 0   irbesartan (AVAPRO) 300 MG tablet, TAKE 1 TABLET EVERY DAY, Disp: 90 tablet, Rfl: 1   loratadine (CLARITIN) 10 MG tablet, Take 1 tablet (10 mg total) by mouth daily., Disp: 100 tablet, Rfl: 1   Magnesium Chloride 64 MG TBEC, Take 1 tablet by mouth daily., Disp: , Rfl:    TRUE METRIX BLOOD GLUCOSE TEST test strip, CHECK BLOOD SUGAR FOUR TIMES DAILY, Disp: 400 strip, Rfl: 3   TRUEplus Lancets 33G MISC, TEST BLOOD SUGAR THREE TIMES DAILY AS NEEDED, Disp: 300 each, Rfl: 3   TRULICITY 3 MG/0.5ML SOAJ, INJECT 3 MG (0.5 ML) UNDER THE SKIN ONCE A WEEK, Disp: 8 mL, Rfl: 0   furosemide (LASIX) 20 MG tablet, Take 1 tablet (20 mg total) by mouth 2 (two) times daily., Disp: 180 tablet, Rfl: 1   pravastatin (PRAVACHOL) 80 MG tablet, Take 1 tablet (80 mg total) by mouth every evening., Disp: 90 tablet, Rfl: 1  Allergies  Allergen Reactions   Farxiga [Dapagliflozin] Hives    I personally reviewed active problem list, medication list, allergies, family history with the patient/caregiver today.   ROS  Ten systems reviewed and is negative except as mentioned in HPI     Objective  Vitals:   10/25/23 0801 10/25/23 0833  BP: (!) 144/86 128/76  Pulse: 74   Resp: 16   SpO2: 98%   Weight: (!) 364  lb 9.6 oz (165.4 kg)   Height: 5\' 10"  (1.778 m)     Body mass index is 52.31 kg/m.  Physical Exam  Constitutional: Patient appears well-developed and well-nourished. Obese  No distress.  HEENT: head atraumatic, normocephalic, pupils equal and reactive to light, neck supple Cardiovascular: Normal rate, regular rhythm and normal heart sounds.  No murmur heard. 1 plus BLE edema. Pulmonary/Chest: Effort normal and breath sounds normal. No respiratory distress. Abdominal: Soft.  There is no tenderness. Skin: see attached picture Psychiatric: Patient has a normal mood and affect. behavior is normal. Judgment and thought content normal.   Recent Results (from the past 2160 hours)  Surgical pathology     Status: None   Collection Time: 08/02/23 12:00 AM  Result Value Ref Range   SURGICAL PATHOLOGY      SURGICAL PATHOLOGY [facility] [address] [place], [place] [address] Telephone [phone] or [phone] Fax [phone]  REPORT OF SURGICAL PATHOLOGY   Accession #: [id number] Patient Name: JAKYLA, REZA Visit # : [id number]  MRN: [id number] Physician: Wyline Mood DOB/Age 10/07/1945 (Age: 25) Gender: F Collected Date: 08/02/2023 Received Date: 08/02/2023  FINAL DIAGNOSIS       1. Ascending  Colon Polyp, CBX x2 :       - TUBULAR ADENOMA (2).      - NEGATIVE FOR HIGH-GRADE DYSPLASIA AND MALIGNANCY.       2. Descending Colon Polyp, CBX :       - HYPERPLASTIC POLYP.      - NEGATIVE FOR DYSPLASIA AND MALIGNANCY.       ELECTRONIC SIGNATURE : Rubinas Md, Delice Bison , Sports administrator, Electronic Signature  MICROSCOPIC DESCRIPTION  CASE COMMENTS STAINS USED IN DIAGNOSIS: H&E H&E    CLINICAL HISTORY  SPECIMEN(S) OBTAINED 1. Ascending  Colon Polyp, CBX X2 2. Descending Colon Polyp, CBX  SPECIMEN  COMMENTS: SPECI MEN CLINICAL INFORMATION: 1. History of colon polyps.Colon polyps    Gross Description 1. "Ascending colon polyp CBX X2", received in formalin are 2 pale tan tissue fragments that are 0.3 x 0.3 x 0.2 cm and 0.5 x 0.2 x 0.1 cm.The specimen is submitted in toto in 1 block (1A). 2. "Descending colon polyp CBx", received in formalin is a single 0.5 x 0.3 x 0.2 cm pale tan tissue fragment.The specimen and submitted in toto in 1 block (2A).      [facility] 08/02/2023        Report signed out from the following location(s) [place] [address], [place] [address] CLIA #: [id number]  [facility] [address] [place], [place] [address] CLIA #: [id number]   Glucose, capillary     Status: Abnormal   Collection Time: 08/02/23  9:07 AM  Result Value Ref Range   Glucose-Capillary 120 (H) 70 - 99 mg/dL    Comment: Glucose reference range applies only to samples taken after fasting for at least 8 hours.  BASIC METABOLIC PANEL WITH GFR     Status: Abnormal   Collection Time: 08/30/23 11:33 AM  Result Value Ref Range   Glucose, Bld 130 (H) 65 - 99 mg/dL    Comment: .            Fasting reference interval . For someone without known diabetes, a glucose value >125 mg/dL indicates that they may have diabetes and this should be confirmed with a follow-up test. .    BUN 26 (H) 7 - 25 mg/dL  Creat 1.24 (H) 0.60 - 1.00 mg/dL   eGFR 45 (L) > OR = 60 mL/min/1.76m2   BUN/Creatinine Ratio 21 6 - 22 (calc)   Sodium 135 135 - 146 mmol/L   Potassium 4.4 3.5 - 5.3 mmol/L   Chloride 102 98 - 110 mmol/L   CO2 25 20 - 32 mmol/L   Calcium 8.4 (L) 8.6 - 10.4 mg/dL  CBC with Differential/Platelet     Status: Abnormal   Collection Time: 08/30/23 11:33 AM  Result Value Ref Range   WBC 7.3 3.8 - 10.8 Thousand/uL   RBC 3.41 (L) 3.80 - 5.10 Million/uL   Hemoglobin 9.5 (L) 11.7 - 15.5 g/dL   HCT 40.9 (L) 81.1 - 91.4 %   MCV 85.9 80.0 -  100.0 fL   MCH 27.9 27.0 - 33.0 pg   MCHC 32.4 32.0 - 36.0 g/dL    Comment: For adults, a slight decrease in the calculated MCHC value (in the range of 30 to 32 g/dL) is most likely not clinically significant; however, it should be interpreted with caution in correlation with other red cell parameters and the patient's clinical condition.    RDW 15.2 (H) 11.0 - 15.0 %   Platelets 234 140 - 400 Thousand/uL   MPV 11.9 7.5 - 12.5 fL   Neutro Abs 5,636 1,500 - 7,800 cells/uL   Absolute Lymphocytes 1,007 850 - 3,900 cells/uL   Absolute Monocytes 394 200 - 950 cells/uL   Eosinophils Absolute 241 15 - 500 cells/uL   Basophils Absolute 22 0 - 200 cells/uL   Neutrophils Relative % 77.2 %   Total Lymphocyte 13.8 %   Monocytes Relative 5.4 %   Eosinophils Relative 3.3 %   Basophils Relative 0.3 %  Microalbumin / creatinine urine ratio     Status: None   Collection Time: 10/10/23 12:00 AM  Result Value Ref Range   Microalb Creat Ratio 1.107     Comment: [facility]  POCT glycosylated hemoglobin (Hb A1C)     Status: Abnormal   Collection Time: 10/25/23  8:03 AM  Result Value Ref Range   Hemoglobin A1C 7.1 (A) 4.0 - 5.6 %   HbA1c POC (<> result, manual entry)     HbA1c, POC (prediabetic range)     HbA1c, POC (controlled diabetic range)      Diabetic Foot Exam:     PHQ2/9:    10/25/2023    8:02 AM 08/30/2023   10:46 AM 07/07/2023    3:29 PM 06/24/2023    8:55 AM 05/10/2023    8:06 AM  Depression screen PHQ 2/9  Decreased Interest 0 0 0 0 0  Down, Depressed, Hopeless 0 0 0 0 0  PHQ - 2 Score 0 0 0 0 0  Altered sleeping 0 0 0 0 0  Tired, decreased energy 0 0 0 0 0  Change in appetite 0 0 0 0 0  Feeling bad or failure about yourself  0 0 0 0 0  Trouble concentrating 0 0 0 0 0  Moving slowly or fidgety/restless 0 0 0 0 0  Suicidal thoughts 0 0 0 0 0  PHQ-9 Score 0 0 0 0 0  Difficult doing work/chores Not difficult at all        phq 9 is negative  Fall Risk:    08/30/2023   10:46 AM  07/07/2023    3:31 PM 06/24/2023    8:55 AM 05/10/2023    8:06 AM 01/07/2023    8:01 AM  Fall Risk   Falls in the past year? 0 0 0 0 0  Number falls in past yr: 0 0  0 0  Injury with Fall? 0 0  0 0  Risk for fall due to : No Fall Risks No Fall Risks No Fall Risks No Fall Risks No Fall Risks  Follow up Falls prevention discussed;Education provided;Falls evaluation completed Falls prevention discussed;Falls evaluation completed Falls prevention discussed Falls prevention discussed Falls prevention discussed     Assessment & Plan  1. Controlled type 2 diabetes mellitus with microalbuminuria, with long-term current use of insulin (HCC) (Primary)  - POCT glycosylated hemoglobin (Hb A1C) - pravastatin (PRAVACHOL) 80 MG tablet; Take 1 tablet (80 mg total) by mouth every evening.  Dispense: 90 tablet; Refill: 1  2. Dilation of aorta (HCC)  - Lipid panel  3. Morbid obesity (HCC)  Discussed with the patient the risk posed by an increased BMI. Discussed importance of portion control, calorie counting and at least 150 minutes of physical activity weekly. Avoid sweet beverages and drink more water. Eat at least 6 servings of fruit and vegetables daily    4. Stage 3a chronic kidney disease (HCC)  - Parathyroid hormone, intact (no Ca) - VITAMIN D 25 Hydroxy (Vit-D Deficiency, Fractures)  5. Chronic atrial fibrillation (HCC)  Rate controlled   6. Thoracic aortic atherosclerosis (HCC)  - Lipid panel  7. Pulmonary hypertension (HCC)  Monitored by cardiologist   8. Secondary hyperparathyroidism of renal origin (HCC)  - COMPLETE METABOLIC PANEL WITH GFR  9. Hypertensive heart disease with heart failure (HCC)  - furosemide (LASIX) 20 MG tablet; Take 1 tablet (20 mg total) by mouth 2 (two) times daily.  Dispense: 180 tablet; Refill: 1  10. Moderate persistent asthma without complication  Continue medication  11. Dyslipidemia associated with type 2 diabetes mellitus (HCC)  On statin  therapy   12. Sebaceous cyst  - Ambulatory referral to General Surgery

## 2023-10-26 ENCOUNTER — Encounter: Payer: Self-pay | Admitting: Family Medicine

## 2023-10-26 LAB — LIPID PANEL
Cholesterol: 112 mg/dL (ref <200)
HDL: 52 mg/dL (ref >=50)
LDL Cholesterol (Calc): 46 mg/dL
Non-HDL Cholesterol (Calc): 60 mg/dL (ref <130)
Total CHOL/HDL Ratio: 2.2 (calc) (ref <5.0)
Triglycerides: 67 mg/dL (ref <150)

## 2023-10-26 LAB — COMPLETE METABOLIC PANEL WITH GFR
AG Ratio: 1.3 (calc) (ref 1.0–2.5)
ALT: 6 U/L (ref 6–29)
AST: 14 U/L (ref 10–35)
Albumin: 3.8 g/dL (ref 3.6–5.1)
Alkaline phosphatase (APISO): 81 U/L (ref 37–153)
BUN/Creatinine Ratio: 23 (calc) — ABNORMAL HIGH (ref 6–22)
BUN: 29 mg/dL — ABNORMAL HIGH (ref 7–25)
CO2: 23 mmol/L (ref 20–32)
Calcium: 8.8 mg/dL (ref 8.6–10.4)
Chloride: 106 mmol/L (ref 98–110)
Creat: 1.24 mg/dL — ABNORMAL HIGH (ref 0.60–1.00)
Globulin: 2.9 g/dL (ref 1.9–3.7)
Glucose, Bld: 146 mg/dL — ABNORMAL HIGH (ref 65–99)
Potassium: 4 mmol/L (ref 3.5–5.3)
Sodium: 140 mmol/L (ref 135–146)
Total Bilirubin: 0.5 mg/dL (ref 0.2–1.2)
Total Protein: 6.7 g/dL (ref 6.1–8.1)
eGFR: 45 mL/min/{1.73_m2} — ABNORMAL LOW (ref >=60)

## 2023-10-26 LAB — PARATHYROID HORMONE, INTACT (NO CA): PTH: 80 pg/mL — ABNORMAL HIGH (ref 16–77)

## 2023-10-26 LAB — VITAMIN D 25 HYDROXY (VIT D DEFICIENCY, FRACTURES): Vit D, 25-Hydroxy: 54 ng/mL (ref 30–100)

## 2023-10-27 ENCOUNTER — Encounter: Payer: Self-pay | Admitting: Surgery

## 2023-10-27 ENCOUNTER — Inpatient Hospital Stay

## 2023-10-27 ENCOUNTER — Ambulatory Visit: Payer: Self-pay | Admitting: Surgery

## 2023-10-27 VITALS — BP 160/61 | HR 75 | Temp 96.5°F | Resp 18

## 2023-10-27 VITALS — BP 177/79 | HR 87 | Temp 98.2°F | Ht 70.0 in | Wt 364.0 lb

## 2023-10-27 DIAGNOSIS — L723 Sebaceous cyst: Secondary | ICD-10-CM

## 2023-10-27 DIAGNOSIS — L720 Epidermal cyst: Secondary | ICD-10-CM | POA: Diagnosis not present

## 2023-10-27 DIAGNOSIS — I129 Hypertensive chronic kidney disease with stage 1 through stage 4 chronic kidney disease, or unspecified chronic kidney disease: Secondary | ICD-10-CM | POA: Diagnosis not present

## 2023-10-27 DIAGNOSIS — D631 Anemia in chronic kidney disease: Secondary | ICD-10-CM

## 2023-10-27 MED ORDER — IRON SUCROSE 20 MG/ML IV SOLN
200.0000 mg | Freq: Once | INTRAVENOUS | Status: AC
Start: 2023-10-27 — End: 2023-10-27
  Administered 2023-10-27: 200 mg via INTRAVENOUS

## 2023-10-27 MED ORDER — SODIUM CHLORIDE 0.9% FLUSH
10.0000 mL | Freq: Once | INTRAVENOUS | Status: AC | PRN
  Administered 2023-10-27: 10 mL
  Filled 2023-10-27: qty 10

## 2023-10-27 NOTE — Patient Instructions (Signed)
 You will need to be off of your Eliquis for 2 full days prior to surgery.   We will schedule a time to remove this in the office.  Removing a Pocket of Fluid in the Skin (Epidermoid Cyst Removal): What to Expect Epidermoid cyst removal is a procedure to remove a pocket of fluid that forms under the skin. This pocket of fluid is called an epidermoid cyst. It's filled with thick, oily substance that your skin glands make. They're usually harmless. If the cyst gets big, painful, or inflamed, your health care provider may suggest removing it. Tell a health care provider about: Any allergies you have. All medicines you take. These include vitamins, herbs, eye drops, and creams. Any problems you or family members have had with anesthesia. Any bleeding problems you have. Any surgeries you've had. Any medical conditions you have. Whether you're pregnant or may be pregnant. What are the risks? Your provider will talk with you about risks. These may include: The cyst coming back. Bleeding. Infection. Scarring. What happens before the procedure? Ask about changing or stopping: Any medicines you take. Any vitamins, herbs, or supplements you take. Do not take aspirin or ibuprofen unless you're told to. If you were given antibiotics, take them as told. Do not stop taking them even if you start to feel better. Shower on the morning of your procedure. Your provider may ask you to wash with a soap that kills germs. What happens during the procedure?  You may be given: Anesthesia. This keeps you from feeling pain. It will numb certain areas of your body. The cyst area will be cleaned. A small cut will be made over the cyst. The cyst will be separated from the surrounding tissues. If possible, the cyst will be taken out as a whole. If the cyst bursts, it will be taken out in pieces. Any bleeding will be controlled. The cut will be closed with stitches, if needed. Antibiotic ointment and a bandage  may be put on the area. These steps may vary. Ask what you can expect. What happens after the procedure? If you were given antibiotic medicine or ointment, use them for the time you were told. Do not stop using them sooner even if you start to feel better. You will need to reapply the enzyme ointment to the wound. Your provider will show you how to reapply the ointment and the dressing. This information is not intended to replace advice given to you by your health care provider. Make sure you discuss any questions you have with your health care provider. Document Revised: 03/25/2023 Document Reviewed: 03/25/2023 Elsevier Patient Education  2024 [employer].

## 2023-10-27 NOTE — Progress Notes (Signed)
 Patient ID: Amber Reyes, female   DOB: [birth year], 78 y.o.   MRN: [id number]  Chief Complaint:  Left posterior neck dermal cyst.   History of Present Illness Amber Reyes is a 78 y.o. female with the above present for several years.  Subtle increase in size.  No history of drainage, or inflammation, tenderness, or pain.  Just a nuisance with clothing/necklace.  Taking eliquis due to A-fib.   Past Medical History Past Medical History:  Diagnosis Date   Allergic rhinitis, cause unspecified    Anxiety    Chronic diastolic CHF (congestive heart failure) (HCC)    CKD (chronic kidney disease), stage III (HCC)    Edema    Essential hypertension, benign    Gout, unspecified    Heart murmur    as child   Hyperlipidemia    Lipoma of unspecified site    Other and unspecified disc disorder of lumbar region    Other general symptoms(780.99)    PAC (premature atrial contraction)    Renal insufficiency    Restless legs syndrome (RLS)    Sebaceous cyst    Super obesity    Type II or unspecified type diabetes mellitus without mention of complication, uncontrolled    Unspecified asthma(493.90)    Unspecified disease of pericardium    Unspecified sleep apnea    Unspecified vitamin D deficiency    Vaginitis and vulvovaginitis, unspecified    Venous insufficiency       Past Surgical History:  Procedure Laterality Date   CARDIAC CATHETERIZATION  2002   [facility]   CATARACT EXTRACTION W/PHACO Right 09/15/2021   Procedure: CATARACT EXTRACTION PHACO AND INTRAOCULAR LENS PLACEMENT (IOC) RIGHT 10.19 01:01.8;  Surgeon: Galen Manila, MD;  Location: [facility];  Service: Ophthalmology;  Laterality: Right;   CATARACT EXTRACTION W/PHACO Left 09/29/2021   Procedure: CATARACT EXTRACTION PHACO AND INTRAOCULAR LENS PLACEMENT (IOC) LEFT DIABETIC 11.40 01:21.2;  Surgeon: Galen Manila, MD;  Location: [facility];  Service: Ophthalmology;  Laterality: Left;   COLONOSCOPY WITH  PROPOFOL N/A 09/25/2018   Procedure: COLONOSCOPY WITH PROPOFOL;  Surgeon: Wyline Mood, MD;  Location: [facility] ENDOSCOPY;  Service: Gastroenterology;  Laterality: N/A;   COLONOSCOPY WITH PROPOFOL N/A 04/13/2019   Procedure: COLONOSCOPY WITH PROPOFOL;  Surgeon: Wyline Mood, MD;  Location: [facility] ENDOSCOPY;  Service: Gastroenterology;  Laterality: N/A;   COLONOSCOPY WITH PROPOFOL N/A 06/27/2020   Procedure: COLONOSCOPY WITH PROPOFOL;  Surgeon: Wyline Mood, MD;  Location: [facility] ENDOSCOPY;  Service: Gastroenterology;  Laterality: N/A;   COLONOSCOPY WITH PROPOFOL N/A 08/02/2023   Procedure: COLONOSCOPY WITH PROPOFOL;  Surgeon: Wyline Mood, MD;  Location: [facility] ENDOSCOPY;  Service: Gastroenterology;  Laterality: N/A;   PERICARDIUM SURGERY     POLYPECTOMY  08/02/2023   Procedure: POLYPECTOMY;  Surgeon: Wyline Mood, MD;  Location: [facility] ENDOSCOPY;  Service: Gastroenterology;;    Allergies  Allergen Reactions   Farxiga [Dapagliflozin] Hives    Current Outpatient Medications  Medication Sig Dispense Refill   acetaminophen (TYLENOL) 650 MG CR tablet Take 650 mg by mouth in the morning and at bedtime.     albuterol (VENTOLIN HFA) 108 (90 Base) MCG/ACT inhaler Inhale 2 puffs into the lungs every 6 (six) hours as needed for wheezing or shortness of breath. 17 each 0   Alcohol Swabs (DROPSAFE ALCOHOL PREP) 70 % PADS USE AS DIRECTED 300 each 3   allopurinol (ZYLOPRIM) 100 MG tablet TAKE 1 TABLET TWICE DAILY 180 tablet 3   apixaban (ELIQUIS) 5 MG TABS tablet Take 1  tablet (5 mg total) by mouth 2 (two) times daily. 7 tablet 0   benzonatate (TESSALON) 100 MG capsule Take 1-2 capsules (100-200 mg total) by mouth 3 (three) times daily as needed. 40 capsule 0   Blood Glucose Calibration (TRUE METRIX LEVEL 1) Low SOLN 1 each by In Vitro route once a week. 1 each 1   Blood Glucose Monitoring Suppl (TRUE METRIX AIR GLUCOSE METER) w/Device KIT Inject 1 each into the skin 4 (four) times daily as needed. Check fsbs 4 times daily  E11.22 1 kit 0   Cholecalciferol (VITAMIN D) 2000 UNITS tablet Take 2,000 Units by mouth daily.     colchicine (COLCRYS) 0.6 MG tablet Take 1-2 tablets (0.6-1.2 mg total) by mouth daily as needed. 30 tablet 0   diltiazem (CARDIZEM CD) 240 MG 24 hr capsule TAKE 1 CAPSULE EVERY DAY 90 capsule 3   DROPLET PEN NEEDLES 30G X 8 MM MISC USE TWICE DAILY WITH INSULIN 200 each 3   Elastic Bandages & Supports (MEDICAL COMPRESSION STOCKINGS) MISC 2 each by Does not apply route daily. 2 each 2   Ferrous Sulfate (IRON SUPPLEMENT PO) Take 1 tablet by mouth daily.     fluticasone (FLONASE) 50 MCG/ACT nasal spray USE 2 SPRAYS IN EACH NOSTRIL EVERY DAY 48 g 3   fluticasone-salmeterol (WIXELA INHUB) 250-50 MCG/ACT AEPB Inhale 1 puff into the lungs in the morning and at bedtime. 180 each 1   furosemide (LASIX) 20 MG tablet Take 1 tablet (20 mg total) by mouth 2 (two) times daily. 180 tablet 1   HUMALOG MIX 75/25 KWIKPEN (75-25) 100 UNIT/ML KwikPen DIAL AND INJECT 10 UNITS UNDER THE SKIN TWICE DAILY WITH A MEAL. MAX DAILY DOSE 20 UNITS. DISCARD USED PEN AFTER 10 DAYS. 45 mL 0   irbesartan (AVAPRO) 300 MG tablet TAKE 1 TABLET EVERY DAY 90 tablet 1   loratadine (CLARITIN) 10 MG tablet Take 1 tablet (10 mg total) by mouth daily. 100 tablet 1   Magnesium Chloride 64 MG TBEC Take 1 tablet by mouth daily.     pravastatin (PRAVACHOL) 80 MG tablet Take 1 tablet (80 mg total) by mouth every evening. 90 tablet 1   TRUE METRIX BLOOD GLUCOSE TEST test strip CHECK BLOOD SUGAR FOUR TIMES DAILY 400 strip 3   TRUEplus Lancets 33G MISC TEST BLOOD SUGAR THREE TIMES DAILY AS NEEDED 300 each 3   TRULICITY 3 MG/0.5ML SOAJ INJECT 3 MG (0.5 ML) UNDER THE SKIN ONCE A WEEK 8 mL 0   No current facility-administered medications for this visit.    Family History Family History  Problem Relation Age of Onset   Heart attack Mother    Hypertension Mother    Bladder Cancer Father    Breast cancer Neg Hx       Social History Social  History   Tobacco Use   Smoking status: Never    Passive exposure: Never   Smokeless tobacco: Never   Tobacco comments:    n/a  Vaping Use   Vaping status: Never Used  Substance Use Topics   Alcohol use: No    Alcohol/week: 0.0 standard drinks of alcohol   Drug use: No        Review of Systems  Constitutional: Negative.   HENT: Negative.    Eyes: Negative.   Respiratory:  Positive for shortness of breath.   Cardiovascular:  Positive for palpitations and leg swelling.  Genitourinary: Negative.   Skin: Negative.   Neurological: Negative.   Psychiatric/Behavioral:  Negative.       Physical Exam Blood pressure (!) 177/79, pulse 87, temperature 98.2 F (36.8 C), height 5\' 10"  (1.778 m), weight (!) 364 lb (165.1 kg), SpO2 98%. Last Weight  Most recent update: 10/27/2023  1:56 PM    Weight  165.1 kg (364 lb)               CONSTITUTIONAL: Well developed, and nourished, appropriately responsive and aware without distress.   EYES: Sclera non-icteric.   EARS, NOSE, MOUTH AND THROAT:  The oropharynx is clear. Oral mucosa is pink and moist.  Hearing is intact to voice.  NECK: Trachea is midline, and there is no jugular venous distension.  LYMPH NODES:  Lymph nodes in the neck are not appreciated. RESPIRATORY:  Normal respiratory effort without pathologic use of accessory muscles. CARDIOVASCULAR: Well perfused.  GI: The abdomen is  soft, nontender, and nondistended. MUSCULOSKELETAL:  Symmetrical muscle tone appreciated in all four extremities.    SKIN: Skin turgor is normal. No pathologic skin lesions appreciated.  Raised 1 cm, 2 cm diameter superficial dermal cystic lesion, no punctum.  No induration or tenderness. Not fluctuant.  NEUROLOGIC:  Motor and sensation appear grossly normal.  Cranial nerves are grossly without defect. PSYCH:  Alert and oriented to person, place and time. Affect is appropriate for situation.  Data Reviewed I have personally reviewed what is  currently available of the patient's imaging, recent labs and medical records.   Labs:     Latest Ref Rng & Units 08/30/2023   11:33 AM 08/25/2022   10:03 AM 07/14/2021    9:01 AM  CBC  WBC 3.8 - 10.8 Thousand/uL 7.3  8.2  8.9   Hemoglobin 11.7 - 15.5 g/dL 9.5  86.5  78.4   Hematocrit 35.0 - 45.0 % 29.3  32.9  33.4   Platelets 140 - 400 Thousand/uL 234  224  228       Latest Ref Rng & Units 10/25/2023    8:50 AM 08/30/2023   11:33 AM 08/25/2022   10:03 AM  CMP  Glucose 65 - 99 mg/dL 696  295  284   BUN 7 - 25 mg/dL 29  26  33   Creatinine 0.60 - 1.00 mg/dL 1.32  4.40  1.02   Sodium 135 - 146 mmol/L 140  135  138   Potassium 3.5 - 5.3 mmol/L 4.0  4.4  4.6   Chloride 98 - 110 mmol/L 106  102  103   CO2 20 - 32 mmol/L 23  25  27    Calcium 8.6 - 10.4 mg/dL 8.8  8.4  8.7   Total Protein 6.1 - 8.1 g/dL 6.7   7.1   Total Bilirubin 0.2 - 1.2 mg/dL 0.5   0.5   AST 10 - 35 U/L 14   14   ALT 6 - 29 U/L 6   7       Imaging: Radiological images reviewed:   Within last 24 hrs: No results found.  Assessment    Dermal cyst left posterior neck.  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 chronic kidney disease (HCC) 10/24/2023   Secondary hyperparathyroidism of renal origin (HCC) 08/25/2022   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   Localized, 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/22/2021   Pain in joint of left shoulder 04/22/2021   Tendonitis of shoulder, left 04/22/2021   Hypertension associated with stage 3 chronic kidney disease due to  type 2 diabetes mellitus (HCC) 02/06/2020   History of colonic polyps    Polyp of colon    Thoracic aortic atherosclerosis (HCC) 12/30/2016   Controlled type 2 diabetes mellitus with microalbuminuria, with long-term current use of insulin (HCC) 12/23/2016   Venous insufficiency 05/21/2016   Hypertensive heart disease 05/21/2016   Anemia in chronic kidney disease 12/22/2015   Pulmonary hypertension (HCC)  12/18/2015   Xanthelasma 04/15/2015   Anxiety 04/12/2015   Chronic kidney disease (CKD), stage III (moderate) (HCC) 04/12/2015   Edema of extremities 04/12/2015   Disc disorder of lumbar region 04/12/2015   Asthma, moderate persistent 04/12/2015   Morbid obesity (HCC) 04/12/2015   Obstructive apnea 04/12/2015   Restless leg 04/12/2015   Allergic rhinitis 04/12/2015   Vitamin D deficiency 04/12/2015   Controlled gout 04/12/2015   Premature atrial contractions 11/29/2013   Atrial fibrillation (HCC) 11/09/2013   Benign essential hypertension    Hyperlipidemia     Plan    Will Hold eliquis for 2 days prior to an in office procedure, next week.   Risks d/w pt and accepted.    Face-to-face time spent with the patient and accompanying care providers(if present) was 20 minutes, spent counseling, educating, and coordinating care of the patient.    These notes generated with voice recognition software. I apologize for typographical errors.  Campbell Lerner M.D., FACS 10/27/2023, 7:29 PM

## 2023-10-31 NOTE — Progress Notes (Unsigned)
***    Pre-operative Diagnosis: ***  Post-operative Diagnosis: same.  ***  Surgeon:  , M.D., FACS  Anesthesia: Local   Findings: ***  Estimated Blood Loss: *** mL         Specimens: ***          Complications: none              Procedure Details  The patient was evaluated, the benefits, complications, treatment options, and expected outcomes were discussed with the patient. The risks of bleeding, infection, recurrence of symptoms, failure to resolve symptoms, unanticipated injury, any of which could require further surgery were reviewed with the patient. The likelihood of improving the patient's symptoms with return to their baseline status is expected.  The patient and/or family concurred with the proposed plan, giving informed consent.  The patient was taken to our procedure room, identified and the procedure verified.    The patient was positioned in the *** position and the *** was prepped with *** Chloraprep and draped in the sterile fashion.  A Time Out was held and the above information confirmed.  ***  

## 2023-11-01 ENCOUNTER — Encounter: Payer: Self-pay | Admitting: Surgery

## 2023-11-01 ENCOUNTER — Ambulatory Visit (INDEPENDENT_AMBULATORY_CARE_PROVIDER_SITE_OTHER): Admitting: Surgery

## 2023-11-01 VITALS — BP 153/76 | HR 91 | Temp 98.4°F | Ht 70.0 in | Wt 367.6 lb

## 2023-11-01 DIAGNOSIS — L723 Sebaceous cyst: Secondary | ICD-10-CM | POA: Diagnosis not present

## 2023-11-01 NOTE — Patient Instructions (Signed)
 We have removed a Cyst in our office today.  You have sutures under the skin that will dissolve and also dermabond (skin glue) on top of your skin which will come off on it's own in 10-14 days.  You may use Ibuprofen or Tylenol as needed for pain control. Use the ice pack 3-4 times a day for the next two days for any achiness.  You may shower 11/02/2023. Do not scrub at the area.   Avoid Strenuous activities that will make you sweat during the next 48 hours to avoid the glue coming off prematurely. Avoid activities that will place pressure to this area of the body for 1-2 weeks to avoid re-injury to incision site.  Please see your follow-up appointment provided. We will see you back in office to make sure this area is healed and to review the final pathology. If you have any questions or concerns prior to this appointment, call our office and speak with a nurse.    Excision of Skin Cysts or Lesions Excision of a skin lesion refers to the removal of a section of skin by making small cuts (incisions) in the skin. This procedure may be done to remove a cancerous (malignant) or noncancerous (benign) growth on the skin. It is typically done to treat or prevent cancer or infection. It may also be done to improve cosmetic appearance. The procedure may be done to remove: Cancerous growths, such as basal cell carcinoma, squamous cell carcinoma, or melanoma. Noncancerous growths, such as a cyst or lipoma. Growths, such as moles or skin tags, which may be removed for cosmetic reasons.  Various excision or surgical techniques may be used depending on your condition, the location of the lesion, and your overall health. Tell a health care provider about: Any allergies you have. All medicines you are taking, including vitamins, herbs, eye drops, creams, and over-the-counter medicines. Any problems you or family members have had with anesthetic medicines. Any blood disorders you have. Any surgeries you  have had. Any medical conditions you have. Whether you are pregnant or may be pregnant. What are the risks? Generally, this is a safe procedure. However, problems may occur, including: Bleeding. Infection. Scarring. Recurrence of the cyst, lipoma, or cancer. Changes in skin sensation or appearance, such as discoloration or swelling. Reaction to the anesthetics. Allergic reaction to surgical materials or ointments. Damage to nerves, blood vessels, muscles, or other structures. Continued pain.  What happens before the procedure? Ask your health care provider about: Changing or stopping your regular medicines. This is especially important if you are taking diabetes medicines or blood thinners. Taking medicines such as aspirin and ibuprofen. These medicines can thin your blood. Do not take these medicines before your procedure if your health care provider instructs you not to. You may be asked to take certain medicines. You may be asked to stop smoking. You may have an exam or testing. What happens during the procedure? To reduce your risk of infection: Your health care team will wash or sanitize their hands. Your skin will be washed with soap. You will be given a medicine to numb the area (local anesthetic). One of the following excision techniques will be performed. At the end of any of these procedures, antibiotic ointment will be applied as needed. Each of the following techniques may vary among health care providers and hospitals. Complete Surgical Excision The area of skin that needs to be removed will be marked with a pen. Using a small scalpel or scissors, the  surgeon will gently cut around and under the lesion until it is completely removed. The lesion will be placed in a fluid and sent to the lab for examination. If necessary, bleeding will be controlled with a device that delivers heat (electrocautery). The edges of the wound may be stitched (sutured) together, and a bandage  (dressing) or surgical glue will be applied. This procedure may be performed to treat a cancerous growth or a noncancerous cyst or lesion.  What happens after the procedure? Return to your normal activities as told by your health care provider. Report any excessive bleeding, spreading redness, or increased pain.

## 2023-11-02 ENCOUNTER — Inpatient Hospital Stay

## 2023-11-02 VITALS — BP 158/56 | HR 74 | Temp 96.0°F | Resp 18

## 2023-11-02 DIAGNOSIS — D631 Anemia in chronic kidney disease: Secondary | ICD-10-CM

## 2023-11-02 DIAGNOSIS — I129 Hypertensive chronic kidney disease with stage 1 through stage 4 chronic kidney disease, or unspecified chronic kidney disease: Secondary | ICD-10-CM | POA: Diagnosis not present

## 2023-11-02 LAB — SURGICAL PATHOLOGY

## 2023-11-02 MED ORDER — SODIUM CHLORIDE 0.9% FLUSH
10.0000 mL | Freq: Once | INTRAVENOUS | Status: AC | PRN
  Administered 2023-11-02: 10 mL
  Filled 2023-11-02: qty 10

## 2023-11-02 MED ORDER — IRON SUCROSE 20 MG/ML IV SOLN
200.0000 mg | Freq: Once | INTRAVENOUS | Status: AC
Start: 2023-11-02 — End: 2023-11-02
  Administered 2023-11-02: 200 mg via INTRAVENOUS

## 2023-11-03 ENCOUNTER — Encounter: Payer: Self-pay | Admitting: Cardiovascular Disease

## 2023-11-03 ENCOUNTER — Ambulatory Visit: Payer: Medicare PPO | Attending: Cardiovascular Disease | Admitting: Cardiovascular Disease

## 2023-11-03 VITALS — BP 184/88 | HR 83 | Ht 70.0 in | Wt 368.0 lb

## 2023-11-03 DIAGNOSIS — G4733 Obstructive sleep apnea (adult) (pediatric): Secondary | ICD-10-CM

## 2023-11-03 DIAGNOSIS — I10 Essential (primary) hypertension: Secondary | ICD-10-CM | POA: Diagnosis not present

## 2023-11-03 DIAGNOSIS — I482 Chronic atrial fibrillation, unspecified: Secondary | ICD-10-CM | POA: Diagnosis not present

## 2023-11-03 DIAGNOSIS — I5032 Chronic diastolic (congestive) heart failure: Secondary | ICD-10-CM | POA: Diagnosis not present

## 2023-11-03 DIAGNOSIS — I4891 Unspecified atrial fibrillation: Secondary | ICD-10-CM

## 2023-11-03 MED ORDER — APIXABAN 5 MG PO TABS: 5.0000 mg | ORAL_TABLET | Freq: Two times a day (BID) | ORAL | 5 refills | Status: DC

## 2023-11-03 NOTE — Patient Instructions (Signed)
 Medication Instructions:  No changes *If you need a refill on your cardiac medications before your next appointment, please call your pharmacy*   Lab Work: None ordered If you have labs (blood work) drawn today and your tests are completely normal, you will receive your results only by: MyChart Message (if you have MyChart) OR A paper copy in the mail If you have any lab test that is abnormal or we need to change your treatment, we will call you to review the results.   Testing/Procedures: None ordered   Follow-Up: At [facility], you and your health needs are our priority.  As part of our continuing mission to provide you with exceptional heart care, we have created designated Provider Care Teams.  These Care Teams include your primary Cardiologist (physician) and Advanced Practice Providers (APPs -  Physician Assistants and Nurse Practitioners) who all work together to provide you with the care you need, when you need it.  We recommend signing up for the patient portal called "MyChart".  Sign up information is provided on this After Visit Summary.  MyChart is used to connect with patients for Virtual Visits (Telemedicine).  Patients are able to view lab/test results, encounter notes, upcoming appointments, etc.  Non-urgent messages can be sent to your provider as well.   To learn more about what you can do with MyChart, go to [email or web address].    Your next appointment:   6 month(s)  Provider:   You may see Lorine Bears, MD or one of the following Advanced Practice Providers on your designated Care Team:   Nicolasa Ducking, NP Eula Listen, PA-C Cadence Fransico Michael, PA-C Charlsie Quest, NP Carlos Levering, NP

## 2023-11-03 NOTE — Progress Notes (Signed)
 Cardiology Office Note   Date:  11/03/2023   ID:  ROCHANDA HARPHAM, DOB December 21, 1945, MRN [id number]  PCP:  Alba Cory, MD  Cardiologist:   Lorine Bears, MD   No chief complaint on file.     History of Present Illness: Amber Reyes is a 78 y.o. female who presents for a followup visit regarding chronic diastolic heart failure and chronic atrial fibrillation. 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  Atrial fibrillation is being treated with rate control and anticoagulation.  Marcelline Deist was associated with a rash and thus was discontinued.  Most recent echocardiogram in August 2023 showed normal LV systolic function, mild biatrial enlargement and no significant valvular abnormalities.  She has been doing reasonably well.  She denies chest pain or palpitations.  She has chronic exertional dyspnea and she is frustrated that she is not able to do much.  Chronic kidney disease remained stable and she does have underlying anemia currently treated by hematology.  Past Medical History:  Diagnosis Date   Allergic rhinitis, cause unspecified    Anxiety    Chronic diastolic CHF (congestive heart failure) (HCC)    CKD (chronic kidney disease), stage III (HCC)    Edema    Essential hypertension, benign    Gout, unspecified    Heart murmur    as child   Hyperlipidemia    Lipoma of unspecified site    Other and unspecified disc disorder of lumbar region    Other general symptoms(780.99)    PAC (premature atrial contraction)    Renal insufficiency    Restless legs syndrome (RLS)    Sebaceous cyst    Super obesity    Type II or unspecified type diabetes mellitus without mention of complication, uncontrolled    Unspecified asthma(493.90)    Unspecified disease of pericardium    Unspecified sleep  apnea    Unspecified vitamin D deficiency    Vaginitis and vulvovaginitis, unspecified    Venous insufficiency     Past Surgical History:  Procedure Laterality Date   CARDIAC CATHETERIZATION  2002   [facility]   CATARACT EXTRACTION W/PHACO Right 09/15/2021   Procedure: CATARACT EXTRACTION PHACO AND INTRAOCULAR LENS PLACEMENT (IOC) RIGHT 10.19 01:01.8;  Surgeon: Galen Manila, MD;  Location: [facility];  Service: Ophthalmology;  Laterality: Right;   CATARACT EXTRACTION W/PHACO Left 09/29/2021   Procedure: CATARACT EXTRACTION PHACO AND INTRAOCULAR LENS PLACEMENT (IOC) LEFT DIABETIC 11.40 01:21.2;  Surgeon: Galen Manila, MD;  Location: [facility];  Service: Ophthalmology;  Laterality: Left;   COLONOSCOPY WITH PROPOFOL N/A 09/25/2018   Procedure: COLONOSCOPY WITH PROPOFOL;  Surgeon: Wyline Mood, MD;  Location: [facility];  Service: Gastroenterology;  Laterality: N/A;   COLONOSCOPY WITH PROPOFOL N/A 04/13/2019   Procedure: COLONOSCOPY WITH PROPOFOL;  Surgeon: Wyline Mood, MD;  Location: [facility] END[facility];  Service: Gastroenterology;  Laterality: N/A;   COLONOSCOPY WITH PROPOFOL N/A 06/27/2020   Procedure: COLONOSCOPY WITH PROPOFOL;  Surgeon: Wyline Mood, MD;  Location: [facility] ENDOSCOPY;  Service: Gastroenterology;  Laterality: N/A;   COLONOSCOPY WITH PROPOFOL N/A 08/02/2023   Procedure: COLONOSCOPY WITH PROPOFOL;  Surgeon: Wyline Mood, MD;  Location: [facility] END[facility]COPY;  Service: Gastroenterology;  Laterality: N/A;   PERICARDIUM SURGERY     POLYPECTOMY  08/02/2023   Procedure: POLYPECTOMY;  Surgeon: Wyline Mood, MD;  Location: [facility] END[facility];  Service: Gastroenterology;;  Current Outpatient Medications  Medication Sig Dispense Refill   acetaminophen (TYLENOL) 650 MG CR tablet Take 650 mg by mouth in the morning and at bedtime.     albuterol (VENTOLIN HFA) 108 (90 Base) MCG/ACT inhaler Inhale 2 puffs into the lungs every 6 (six) hours as needed for wheezing or shortness of  breath. 17 each 0   Alcohol Swabs (DROPSAFE ALCOHOL PREP) 70 % PADS USE AS DIRECTED 300 each 3   allopurinol (ZYLOPRIM) 100 MG tablet TAKE 1 TABLET TWICE DAILY 180 tablet 3   apixaban (ELIQUIS) 5 MG TABS tablet Take 1 tablet (5 mg total) by mouth 2 (two) times daily. 7 tablet 0   benzonatate (TESSALON) 100 MG capsule Take 1-2 capsules (100-200 mg total) by mouth 3 (three) times daily as needed. 40 capsule 0   Blood Glucose Calibration (TRUE METRIX LEVEL 1) Low SOLN 1 each by In Vitro route once a week. 1 each 1   Blood Glucose Monitoring Suppl (TRUE METRIX AIR GLUCOSE METER) w/Device KIT Inject 1 each into the skin 4 (four) times daily as needed. Check fsbs 4 times daily E11.22 1 kit 0   Cholecalciferol (VITAMIN D) 2000 UNITS tablet Take 2,000 Units by mouth daily.     colchicine (COLCRYS) 0.6 MG tablet Take 1-2 tablets (0.6-1.2 mg total) by mouth daily as needed. 30 tablet 0   diltiazem (CARDIZEM CD) 240 MG 24 hr capsule TAKE 1 CAPSULE EVERY DAY 90 capsule 3   DROPLET PEN NEEDLES 30G X 8 MM MISC USE TWICE DAILY WITH INSULIN 200 each 3   Elastic Bandages & Supports (MEDICAL COMPRESSION STOCKINGS) MISC 2 each by Does not apply route daily. 2 each 2   Ferrous Sulfate (IRON SUPPLEMENT PO) Take 1 tablet by mouth daily.     fluticasone (FLONASE) 50 MCG/ACT nasal spray USE 2 SPRAYS IN EACH NOSTRIL EVERY DAY 48 g 3   fluticasone-salmeterol (WIXELA INHUB) 250-50 MCG/ACT AEPB Inhale 1 puff into the lungs in the morning and at bedtime. 180 each 1   furosemide (LASIX) 20 MG tablet Take 1 tablet (20 mg total) by mouth 2 (two) times daily. 180 tablet 1   HUMALOG MIX 75/25 KWIKPEN (75-25) 100 UNIT/ML KwikPen DIAL AND INJECT 10 UNITS UNDER THE SKIN TWICE DAILY WITH A MEAL. MAX DAILY DOSE 20 UNITS. DISCARD USED PEN AFTER 10 DAYS. 45 mL 0   irbesartan (AVAPRO) 300 MG tablet TAKE 1 TABLET EVERY DAY 90 tablet 1   loratadine (CLARITIN) 10 MG tablet Take 1 tablet (10 mg total) by mouth daily. 100 tablet 1   Magnesium  Chloride 64 MG TBEC Take 1 tablet by mouth daily.     pravastatin (PRAVACHOL) 80 MG tablet Take 1 tablet (80 mg total) by mouth every evening. 90 tablet 1   TRUE METRIX BLOOD GLUCOSE TEST test strip CHECK BLOOD SUGAR FOUR TIMES DAILY 400 strip 3   TRUEplus Lancets 33G MISC TEST BLOOD SUGAR THREE TIMES DAILY AS NEEDED 300 each 3   TRULICITY 3 MG/0.5ML SOAJ INJECT 3 MG (0.5 ML) UNDER THE SKIN ONCE A WEEK 8 mL 0   No current facility-administered medications for this visit.    Allergies:   Farxiga [dapagliflozin]    Social History:  The patient  reports that she has never smoked. She has never been exposed to tobacco smoke. She has never used smokeless tobacco. She reports that she does not drink alcohol and does not use drugs.   Family History:  The patient's family history includes  Bladder Cancer in her father; Heart attack in her mother; Hypertension in her mother.    ROS:  Please see the history of present illness.   Otherwise, review of systems are positive for none.   All other systems are reviewed and negative.    PHYSICAL EXAM: VS:  There were no vitals taken for this visit. , BMI There is no height or weight on file to calculate BMI. GEN: Well nourished, well developed, in no acute distress  HEENT: normal  Neck: no JVD, carotid bruits, or masses Cardiac: Irregularly irregular; no rubs, or gallops.  1/ 6 systolic murmur in the aortic area.  Trace bilateral leg edema. Respiratory:  clear to auscultation bilaterally, normal work of breathing GI: soft, nontender, nondistended, + BS MS: no deformity or atrophy  Skin: warm and dry, no rash Neuro:  Strength and sensation are intact Psych: euthymic mood, full affect   EKG:  EKG is ordered today. The ekg ordered today demonstrates : Atrial fibrillation Low voltage QRS Cannot rule out Anteroseptal infarct (cited on or before 20-May-2016) When compared with ECG of 05-May-2023 08:06, No significant change was found         Recent Labs: 08/30/2023: Hemoglobin 9.5; Platelets 234 10/25/2023: ALT 6; BUN 29; Creat 1.24; Potassium 4.0; Sodium 140    Lipid Panel    Component Value Date/Time   CHOL 112 10/25/2023 0850   CHOL 139 04/16/2015 1138   TRIG 67 10/25/2023 0850   HDL 52 10/25/2023 0850   HDL 52 04/16/2015 1138   CHOLHDL 2.2 10/25/2023 0850   VLDL 19 12/23/2016 0941   LDLCALC 46 10/25/2023 0850      Wt Readings from Last 3 Encounters:  11/01/23 (!) 367 lb 9.6 oz (166.7 kg)  10/27/23 (!) 364 lb (165.1 kg)  10/25/23 (!) 364 lb 9.6 oz (165.4 kg)           No data to display            ASSESSMENT AND PLAN:  1.  Chronic atrial fibrillation:  Chads Vasc score is 5 .  We refilled Eliquis.  Most recent labs showed improvement in creatinine which was down to 1.24. Regular rate is controlled with diltiazem.  2. Chronic diastolic heart failure: She appears to be euvolemic on furosemide 20 mg twice daily.  3. Essential hypertension: Blood pressure was initially elevated but I rechecked at the end of the visit and it was 144/80.  Continue diltiazem and irbesartan for now.  If her blood pressure is consistently uncontrolled, we will plan on switching diltiazem to carvedilol and likely adding small dose amlodipine.  4.  Obstructive sleep apnea: She uses CPAP on a regular basis.   Disposition:   FU with me in 6 months  Signed,  Lorine Bears, MD  11/03/2023 8:13 AM    [facility]

## 2023-11-09 ENCOUNTER — Inpatient Hospital Stay

## 2023-11-09 VITALS — BP 153/92 | HR 62 | Temp 98.0°F | Resp 17

## 2023-11-09 DIAGNOSIS — I129 Hypertensive chronic kidney disease with stage 1 through stage 4 chronic kidney disease, or unspecified chronic kidney disease: Secondary | ICD-10-CM | POA: Diagnosis not present

## 2023-11-09 DIAGNOSIS — D631 Anemia in chronic kidney disease: Secondary | ICD-10-CM

## 2023-11-09 MED ORDER — IRON SUCROSE 20 MG/ML IV SOLN
200.0000 mg | Freq: Once | INTRAVENOUS | Status: AC
  Administered 2023-11-09: 200 mg via INTRAVENOUS
  Filled 2023-11-09: qty 10

## 2023-11-09 MED ORDER — SODIUM CHLORIDE 0.9% FLUSH
10.0000 mL | Freq: Once | INTRAVENOUS | Status: AC | PRN
Start: 2023-11-09 — End: 2023-11-09
  Administered 2023-11-09: 10 mL
  Filled 2023-11-09: qty 10

## 2023-11-09 NOTE — Patient Instructions (Signed)

## 2023-11-10 ENCOUNTER — Ambulatory Visit (INDEPENDENT_AMBULATORY_CARE_PROVIDER_SITE_OTHER): Admitting: Surgery

## 2023-11-10 ENCOUNTER — Encounter: Payer: Self-pay | Admitting: Surgery

## 2023-11-10 VITALS — BP 160/78 | HR 80 | Ht 70.0 in

## 2023-11-10 DIAGNOSIS — Z09 Encounter for follow-up examination after completed treatment for conditions other than malignant neoplasm: Secondary | ICD-10-CM

## 2023-11-10 DIAGNOSIS — L723 Sebaceous cyst: Secondary | ICD-10-CM

## 2023-11-10 NOTE — Patient Instructions (Addendum)
 The glue should start to come off in 1-2 weeks. You may shower and scrub at the area.   The area should flatten out in the next month.   Follow-up with our office as needed.  Please call and ask to speak with a nurse if you develop questions or concerns.   After the glue comes off, you may rub Vitamin-E oil, cocoa butter, or other emmolient agent in area 2-3 times a day to soften. You will need to use sunscreen for the next year on the area to minimize altered pigmentation of the site.

## 2023-11-10 NOTE — Progress Notes (Signed)
 [facility] POST-OP OFFICE VISIT  11/10/2023  HPI: Amber Reyes is a 78 y.o. female had surgery on November 01, 2023, now s/p dermal cyst from posterior neck.  Vital signs: BP (!) 160/78   Pulse 80   Ht 5\' 10"  (1.778 m)   BMI 52.80 kg/m    Physical Exam: Constitutional: She appears at her baseline, well. Skin: The incision on the left posterior neck is clean dry and intact the Dermabond is flaking off.  There is little more thickening there than I would expect.  But no evidence of discharge induration or erythema.  Assessment/Plan: This is a 78 y.o. female status post excision of sebaceous cyst from neck.  Progressing well.  No complaints or issues just some mild itching.  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 chronic kidney disease (HCC) 10/24/2023   Secondary hyperparathyroidism of renal origin (HCC) 08/25/2022   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   Localized, 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/22/2021   Pain in joint of left shoulder 04/22/2021   Tendonitis of shoulder, left 04/22/2021   Hypertension associated with stage 3 chronic kidney disease due to type 2 diabetes mellitus (HCC) 02/06/2020   History of colonic polyps    Polyp of colon    Thoracic aortic atherosclerosis (HCC) 12/30/2016   Controlled type 2 diabetes mellitus with microalbuminuria, with long-term current use of insulin (HCC) 12/23/2016   Venous insufficiency 05/21/2016   Hypertensive heart disease 05/21/2016   Anemia in chronic kidney disease 12/22/2015   Pulmonary hypertension (HCC) 12/18/2015   Xanthelasma 04/15/2015   Anxiety 04/12/2015   Chronic kidney disease (CKD), stage III (moderate) (HCC) 04/12/2015   Edema of extremities 04/12/2015   Disc disorder of lumbar region 04/12/2015   Asthma, moderate persistent 04/12/2015   Morbid obesity (HCC) 04/12/2015   Obstructive apnea 04/12/2015   Restless leg 04/12/2015   Allergic  rhinitis 04/12/2015   Vitamin D deficiency 04/12/2015   Controlled gout 04/12/2015   Premature atrial contractions 11/29/2013   Atrial fibrillation (HCC) 11/09/2013   Benign essential hypertension    Hyperlipidemia     -We will be glad to see this patient back should any changes develop from her healing cystectomy site of left posterior neck skin.   Campbell Lerner M.D., FACS 11/10/2023, 10:28 AM

## 2023-11-28 ENCOUNTER — Other Ambulatory Visit: Payer: Self-pay | Admitting: Family Medicine

## 2023-11-28 DIAGNOSIS — J4540 Moderate persistent asthma, uncomplicated: Secondary | ICD-10-CM

## 2023-12-01 ENCOUNTER — Other Ambulatory Visit: Payer: Self-pay | Admitting: Family Medicine

## 2023-12-01 DIAGNOSIS — Z1231 Encounter for screening mammogram for malignant neoplasm of breast: Secondary | ICD-10-CM

## 2023-12-07 ENCOUNTER — Other Ambulatory Visit: Payer: Self-pay | Admitting: Pharmacist

## 2023-12-07 ENCOUNTER — Telehealth: Payer: Self-pay | Admitting: Pharmacist

## 2023-12-07 DIAGNOSIS — I10 Essential (primary) hypertension: Secondary | ICD-10-CM

## 2023-12-07 DIAGNOSIS — J4540 Moderate persistent asthma, uncomplicated: Secondary | ICD-10-CM

## 2023-12-07 DIAGNOSIS — I482 Chronic atrial fibrillation, unspecified: Secondary | ICD-10-CM

## 2023-12-07 DIAGNOSIS — N183 Chronic kidney disease, stage 3 unspecified: Secondary | ICD-10-CM

## 2023-12-07 NOTE — Patient Instructions (Addendum)
 Goals Addressed             This Visit's Progress    Pharmacy Goals       If you need to reach out to patient assistance programs regarding refills or to find out the status of your application, you can do so by calling:  [employer] [phone]  Check your blood pressure twice weekly, and any time you have concerning symptoms like headache, chest pain, dizziness, shortness of breath, or vision changes.   Our goal is less than 130/80.  To appropriately check your blood pressure, make sure you do the following:  1) Avoid caffeine, exercise, or tobacco products for 30 minutes before checking. Empty your bladder. 2) Sit with your back supported in a flat-backed chair. Rest your arm on something flat (arm of the chair, table, etc). 3) Sit still with your feet flat on the floor, resting, for at least 5 minutes.  4) Check your blood pressure. Take 1-2 readings.  5) Write down these readings and bring with you to any provider appointments.  Bring your home blood pressure machine with you to a provider's office for accuracy comparison at least once a year.   Make sure you take your blood pressure medications before you come to any office visit, even if you were asked to fast for labs.    Please feel free to give me a call if needed for any medication related questions or concerns.   Thank you!   Arthur Lash, PharmD, [facility] [phone]

## 2023-12-07 NOTE — Progress Notes (Signed)
 12/07/2023 Name: BRIEANNE MIGNONE MRN: [id number] DOB: 12-Dec-1945  Chief Complaint  Patient presents with   Medication Management   Medication Assistance    Amber Reyes is a 78 y.o. year old female who presented for a telephone visit.   They were referred to the pharmacist for assistance in managing medication access.     Subjective:  Care Team: Primary Care Provider: Alba Cory, MD ; Next Scheduled Visit: 02/28/2024 Cardiologist: Iran Ouch, MD Nephrologist: Rolm Baptise, MD Hematology: Rickard Patience, MD; Next Scheduled Visit: 12/26/2023  Medication Access/Adherence  Current Pharmacy:  [id number] - [place], [place] - [address] AT [facility] [address] [facility] [address] [place] [address] Phone: [phone] Fax: [phone]  [facility] - [place], [place] - [address] [place] [address] Phone: [phone] Fax: [phone]  [facility] - [place], [place] - [address] [place] [address] Phone: [phone] Fax: [phone]  [facility] - [address] [place] [address] Phone: [phone] Fax: [phone]  [facility] - [place], [place] - [address] [place] [address] Phone: [phone] Fax: [phone]   Patient reports affordability concerns with their medications: No  Patient reports access/transportation concerns to their pharmacy: No  Patient reports adherence concerns with their medications:  No      Diabetes:   Current medications:  - Trulicity 3 mg weekly - Humalog Mix 75/25 Kwikpen - 12 units twice daily before breakfast and supper   Current glucose readings: morning fasting readings ranging: 109-126   Patient denies hypoglycemic s/sx including dizziness, shakiness, sweating   Statin  therapy: pravastatin 80 mg daily   Current medication access support: enrolled in patient assistance for Trulicity and Humalog Mix from [employer] patient assistance through 08/22/2024     Hypertension/Atrial Fibrillation/CHF:   Current medications:  Rate Control: diltiazem CD 240 mg daily Anticoagulation Regimen: Eliquis 5 mg twice daily ACEi/ARB/ARNI: irbesartan 300 mg daily Diuretic: furosemide 20 mg - 1 tablet twice daily   Reports obtained new upper arm blood pressure monitor:  - 4/10: 138/62, HR 78 - 4/11 144/63, HR 65 - Today: 139/63, HR 79  *Reports often readings are not rested and prior to her medications   Patient denies hypotensive s/sx including dizziness, lightheadedness.    Current medication access support: none   Asthma/COPD/Allergic Rhinitis:   Current medications: - Wixela inhaler 250-50 mcg/act - 1 puff twice daily - albuterol HFA inhaler - 2 puffs every 6 hours as needed for wheezing/shortness of breath - fluticasone nasal spray - 2 sprays in each nostril daily - loratadine 10 mg daily   Confirms rinses and spits out after each use of Wixela inhaler  Reports trying to avoid triggers such as pollen   Objective:  Lab Results  Component Value Date   HGBA1C 7.1 (A) 10/25/2023    Lab Results  Component Value Date   CREATININE 1.24 (H) 10/25/2023   BUN 29 (H) 10/25/2023   NA 140 10/25/2023   K 4.0 10/25/2023   CL 106 10/25/2023   CO2 23 10/25/2023    Lab Results  Component Value Date   CHOL 112 10/25/2023   HDL 52 10/25/2023   LDLCALC 46 10/25/2023   TRIG 67 10/25/2023   CHOLHDL 2.2 10/25/2023   BP  Readings from Last 3 Encounters:  11/10/23 (!) 160/78  11/09/23 (!) 153/92  11/03/23 (!) 184/88   Pulse Readings from Last 3 Encounters:  11/10/23 80  11/09/23 62  11/03/23 83     Medications Reviewed Today     Reviewed by Manuela Neptune, RPH-CPP (Pharmacist) on 12/07/23 at [phone]  Med List Status: <None>   Medication Order Taking?  Sig Documenting Provider Last Dose Status Informant  acetaminophen (TYLENOL) 650 MG CR tablet [id number]  Take 650 mg by mouth in the morning and at bedtime. [provider]  Active   albuterol (VENTOLIN HFA) 108 (90 Base) MCG/ACT inhaler [id number]  Inhale 2 puffs into the lungs every 6 (six) hours as needed for wheezing or shortness of breath. Alba Cory, MD  Active   Alcohol Swabs (DROPSAFE ALCOHOL PREP) 70 % PADS [id number]  USE AS DIRECTED Alba Cory, MD  Active   allopurinol (ZYLOPRIM) 100 MG tablet [id number]  TAKE 1 TABLET TWICE DAILY Sowles, Danna Hefty, MD  Active   apixaban (ELIQUIS) 5 MG TABS tablet [id number] Yes Take 1 tablet (5 mg total) by mouth 2 (two) times daily. Iran Ouch, MD Taking Active   Blood Glucose Calibration (TRUE METRIX LEVEL 1) Low SOLN [id number]  1 each by In Vitro route once a week. Alba Cory, MD  Active   Blood Glucose Monitoring Suppl (TRUE METRIX AIR GLUCOSE METER) w/Device KIT [id number]  Inject 1 each into the skin 4 (four) times daily as needed. Check fsbs 4 times daily E11.22 Alba Cory, MD  Active   Cholecalciferol (VITAMIN D) 2000 UNITS tablet [id number]  Take 2,000 Units by mouth daily. [provider]  Active Self  colchicine (COLCRYS) 0.6 MG tablet [id number]  Take 1-2 tablets (0.6-1.2 mg total) by mouth daily as needed. Alba Cory, MD  Active   diltiazem [facility] CD) 240 MG 24 hr capsule [id number] Yes TAKE 1 CAPSULE EVERY DAY Iran Ouch, MD Taking Active   DROPLET PEN NEEDLES 30G X 8 MM MISC [id number]  USE TWICE DAILY WITH INSULIN Alba Cory, MD  Active   Elastic Bandages & Supports (MEDICAL COMPRESSION STOCKINGS) MISC [id number]  2 each by Does not apply route daily. Alba Cory, MD  Active Self  Ferrous Sulfate (IRON SUPPLEMENT PO) [id number]  Take 1 tablet by mouth daily. [provider]  Active   fluticasone (FLONASE) 50 MCG/ACT nasal spray [id number]  USE 2 SPRAYS IN [facility] NOSTRIL EVERY DAY  Alba Cory, MD  Active   fluticasone-salmeterol [facility] INHUB) 250-50 MCG/ACT AEPB [id number] Yes Inhale 1 puff into the lungs in the morning and at bedtime. Alba Cory, MD Taking Active   furosemide (LASIX) 20 MG tablet [id number] Yes Take 1 tablet (20 mg total) by mouth 2 (two) times daily. Alba Cory, MD Taking Active   HUMALOG MIX 75/25 KWIKPEN (75-25) 100 UNIT/ML KwikPen [id number] Yes DIAL AND INJECT 10 UNITS UNDER THE SKIN TWICE DAILY WITH A MEAL. MAX DAILY DOSE 20 UNITS. DISCARD USED PEN AFTER 10 DAYS. Alba Cory, MD Taking Active            Med Note Ronney Asters, [facility]   Thu Jun 30, 2023  9:03 AM) Reports injecting 12 units twice daily with a meal  irbesartan (AVAPRO) 300 MG tablet [id number] Yes TAKE 1 TABLET EVERY DAY Sowles, Danna Hefty, MD Taking Active   loratadine (CLARITIN) 10 MG tablet [id number]  Take 1 tablet (10 mg total) by mouth daily. Alba Cory, MD  Active   Magnesium Chloride 64  MG TBEC [id number]  Take 1 tablet by mouth daily. [provider]  Active   pravastatin (PRAVACHOL) 80 MG tablet [id number] Yes Take 1 tablet (80 mg total) by mouth every evening. Sowles, Krichna, MD Taking Active   TRUE METRIX BLOOD GLUCOSE TEST test strip [id number]  CHECK BLOOD SUGAR FOUR TIMES DAILY Sowles, Krichna, MD  Active   TRUEplus Lancets 33G MISC [id number]  TEST BLOOD SUGAR THREE TIMES DAILY AS NEEDED Sowles, Krichna, MD  Active   TRULICITY 3 MG/0.5ML [place] [id number] Yes INJECT 3 MG (0.5 ML) UNDER THE SKIN ONCE A WEEK Sowles, Krichna, MD Taking Active               Assessment/Plan:   Diabetes: - Reviewed goal A1c, goal fasting, and goal 2 hour post prandial glucose - Recommend to check glucose, keep log of results and have this record to review at upcoming medical appointments. Patient to contact provider office sooner if needed for readings outside of established parameters or symptoms - Patient to follow up with [employer] patient assistance as needed for  refills of Trulicity and Humalog Mix   Hypertension/Atrial Fibrillation/CHF: - Reviewed long term cardiovascular and renal outcomes of uncontrolled blood pressure - Reviewed appropriate blood pressure monitoring technique and reviewed goal blood pressure.  - Recommend to monitor home blood pressure, keep log of results and have this record to review at upcoming medical appointments. Patient to contact provider office sooner if needed for readings outside of established parameters or symptoms   Asthma/COPD/Allergic Rhinitis: - Reviewed appropriate inhaler technique - Send message to clinic team as patient requesting renewal of loratadine prescription     Follow Up Plan: Clinical Pharmacist will follow up with patient by telephone on 06/06/2024 at 8:30 AM      Arthur Lash, PharmD, [facility] [phone]

## 2023-12-07 NOTE — Telephone Encounter (Signed)
 Pt states she had received a letter from her pharmacy stating no more refills. I advised patient she should still have enough till 2nd week of MAY. It is too early too refill. Pt verbalized understanding

## 2023-12-07 NOTE — Progress Notes (Signed)
 Patient requesting renewal of her loratadine prescription as current Rx is out of refills.  Would you please send this to [facility] for patient?  Thank you!  Arthur Lash, PharmD, [facility] [phone]

## 2023-12-13 ENCOUNTER — Encounter

## 2023-12-15 ENCOUNTER — Other Ambulatory Visit: Payer: Self-pay | Admitting: Family Medicine

## 2023-12-15 DIAGNOSIS — J4540 Moderate persistent asthma, uncomplicated: Secondary | ICD-10-CM

## 2023-12-19 ENCOUNTER — Other Ambulatory Visit

## 2023-12-19 ENCOUNTER — Inpatient Hospital Stay: Attending: Oncology

## 2023-12-19 DIAGNOSIS — N1831 Chronic kidney disease, stage 3a: Secondary | ICD-10-CM | POA: Diagnosis present

## 2023-12-19 DIAGNOSIS — D631 Anemia in chronic kidney disease: Secondary | ICD-10-CM | POA: Diagnosis present

## 2023-12-19 LAB — CBC WITH DIFFERENTIAL (CANCER CENTER ONLY)
Abs Immature Granulocytes: 0.09 10*3/uL — ABNORMAL HIGH (ref 0.00–0.07)
Basophils Absolute: 0.1 10*3/uL (ref 0.0–0.1)
Basophils Relative: 1 %
Eosinophils Absolute: 0.3 10*3/uL (ref 0.0–0.5)
Eosinophils Relative: 4 %
HCT: 30.1 % — ABNORMAL LOW (ref 36.0–46.0)
Hemoglobin: 9.5 g/dL — ABNORMAL LOW (ref 12.0–15.0)
Immature Granulocytes: 1 %
Lymphocytes Relative: 17 %
Lymphs Abs: 1.3 10*3/uL (ref 0.7–4.0)
MCH: 28.3 pg (ref 26.0–34.0)
MCHC: 31.6 g/dL (ref 30.0–36.0)
MCV: 89.6 fL (ref 80.0–100.0)
Monocytes Absolute: 0.6 10*3/uL (ref 0.1–1.0)
Monocytes Relative: 8 %
Neutro Abs: 5.1 10*3/uL (ref 1.7–7.7)
Neutrophils Relative %: 69 %
Platelet Count: 191 10*3/uL (ref 150–400)
RBC: 3.36 MIL/uL — ABNORMAL LOW (ref 3.87–5.11)
RDW: 15.9 % — ABNORMAL HIGH (ref 11.5–15.5)
WBC Count: 7.4 10*3/uL (ref 4.0–10.5)
nRBC: 0 % (ref 0.0–0.2)

## 2023-12-19 LAB — IRON AND TIBC
Iron: 50 ug/dL (ref 28–170)
Saturation Ratios: 17 % (ref 10.4–31.8)
TIBC: 291 ug/dL (ref 250–450)
UIBC: 241 ug/dL

## 2023-12-19 LAB — RETIC PANEL
Immature Retic Fract: 19.2 % — ABNORMAL HIGH (ref 2.3–15.9)
RBC.: 3.36 MIL/uL — ABNORMAL LOW (ref 3.87–5.11)
Retic Count, Absolute: 55.4 10*3/uL (ref 19.0–186.0)
Retic Ct Pct: 1.7 % (ref 0.4–3.1)
Reticulocyte Hemoglobin: 30.7 pg (ref >27.9)

## 2023-12-19 LAB — FERRITIN: Ferritin: 127 ng/mL (ref 11–307)

## 2023-12-20 LAB — KAPPA/LAMBDA LIGHT CHAINS
Kappa free light chain: 59.7 mg/L — ABNORMAL HIGH (ref 3.3–19.4)
Kappa, lambda light chain ratio: 1.4 (ref 0.26–1.65)
Lambda free light chains: 42.5 mg/L — ABNORMAL HIGH (ref 5.7–26.3)

## 2023-12-21 ENCOUNTER — Other Ambulatory Visit: Payer: Self-pay | Admitting: Family Medicine

## 2023-12-21 DIAGNOSIS — I10 Essential (primary) hypertension: Secondary | ICD-10-CM

## 2023-12-22 LAB — MULTIPLE MYELOMA PANEL, SERUM
Albumin SerPl Elph-Mcnc: 3.4 g/dL (ref 2.9–4.4)
Albumin/Glob SerPl: 1 (ref 0.7–1.7)
Alpha 1: 0.3 g/dL (ref 0.0–0.4)
Alpha2 Glob SerPl Elph-Mcnc: 0.8 g/dL (ref 0.4–1.0)
B-Globulin SerPl Elph-Mcnc: 1.1 g/dL (ref 0.7–1.3)
Gamma Glob SerPl Elph-Mcnc: 1.4 g/dL (ref 0.4–1.8)
Globulin, Total: 3.5 g/dL (ref 2.2–3.9)
IgA: 270 mg/dL (ref 64–422)
IgG (Immunoglobin G), Serum: 1425 mg/dL (ref 586–1602)
IgM (Immunoglobulin M), Srm: 183 mg/dL (ref 26–217)
Total Protein ELP: 6.9 g/dL (ref 6.0–8.5)

## 2023-12-25 ENCOUNTER — Ambulatory Visit (INDEPENDENT_AMBULATORY_CARE_PROVIDER_SITE_OTHER)

## 2023-12-25 ENCOUNTER — Other Ambulatory Visit: Payer: Self-pay | Admitting: Family Medicine

## 2023-12-25 ENCOUNTER — Ambulatory Visit
Admission: EM | Admit: 2023-12-25 | Discharge: 2023-12-25 | Disposition: A | Discharge status: Home / Self Care | Attending: Physician Assistant | Admitting: Physician Assistant

## 2023-12-25 DIAGNOSIS — E1129 Type 2 diabetes mellitus with other diabetic kidney complication: Secondary | ICD-10-CM

## 2023-12-25 DIAGNOSIS — J189 Pneumonia, unspecified organism: Secondary | ICD-10-CM

## 2023-12-25 DIAGNOSIS — R051 Acute cough: Secondary | ICD-10-CM | POA: Diagnosis not present

## 2023-12-25 DIAGNOSIS — N183 Chronic kidney disease, stage 3 unspecified: Secondary | ICD-10-CM

## 2023-12-25 LAB — POC COVID19/FLU A&B COMBO
Covid Antigen, POC: NEGATIVE
Influenza A Antigen, POC: NEGATIVE
Influenza B Antigen, POC: NEGATIVE

## 2023-12-25 MED ORDER — DOXYCYCLINE HYCLATE 100 MG PO CAPS
100.0000 mg | ORAL_CAPSULE | Freq: Two times a day (BID) | ORAL | 0 refills | Status: DC
Start: 2023-12-25 — End: 2024-01-13

## 2023-12-25 MED ORDER — BENZONATATE 100 MG PO CAPS: 100.0000 mg | ORAL_CAPSULE | Freq: Three times a day (TID) | ORAL | 0 refills | Status: DC

## 2023-12-25 MED ORDER — IPRATROPIUM-ALBUTEROL 0.5-2.5 (3) MG/3ML IN SOLN
3.0000 mL | Freq: Once | RESPIRATORY_TRACT | Status: AC
  Administered 2023-12-25: 3 mL via RESPIRATORY_TRACT

## 2023-12-25 MED ORDER — AMOXICILLIN-POT CLAVULANATE 500-125 MG PO TABS: 1.0000 | ORAL_TABLET | Freq: Two times a day (BID) | ORAL | 0 refills | Status: DC

## 2023-12-25 NOTE — ED Triage Notes (Signed)
 Patient to Urgent Care with complaints of cough/ shortness of breath with exertion/ nasal congestion. Denies any known fevers.   Symptoms started three days ago.   Meds: increases usage of albuterol  inhaler (last usage yesterday afternoon)/ tylenol / claritin .

## 2023-12-25 NOTE — ED Provider Notes (Signed)
 Amber Reyes    CSN: [id number] Arrival date & time: 12/25/23  4098      History   Chief Complaint Chief Complaint  Patient presents with   Cough   Nasal Congestion    HPI Amber Reyes is a 78 y.o. female.   Patient presents today with a 3-day history of URI symptoms.  She reports congestion, cough, shortness of breath.  Denies any fever, chest pain, nausea, vomiting, diarrhea.  She reports that she was around her grandchildren who had runny noses but denies additional sick contacts.  She has had COVID earlier this year but more than 90 days ago.  She has had COVID vaccines but has not had most recent booster.  She received influenza vaccination at the end of last year.  She was treated for pneumonia by her PCP several months ago but denies additional antibiotics in the past 90 days.  She denies any recent steroids.  She does have a history of diabetes but her last A1c obtained 10/25/2023 was well-controlled at 7.1%.  She does have a history of asthma and has been using her albuterol  more frequently without improvement of symptoms.  She denies hospitalization related to asthma.  She is prescribed Wixela and reports compliance with this medicine.    Past Medical History:  Diagnosis Date   Allergic rhinitis, cause unspecified    Anxiety    Chronic diastolic CHF (congestive heart failure) (HCC)    CKD (chronic kidney disease), stage III (HCC)    Edema    Essential hypertension, benign    Gout, unspecified    Heart murmur    as child   Hyperlipidemia    Lipoma of unspecified site    Other and unspecified disc disorder of lumbar region    Other general symptoms(780.99)    PAC (premature atrial contraction)    Renal insufficiency    Restless legs syndrome (RLS)    Sebaceous cyst    Super obesity    Type II or unspecified type diabetes mellitus without mention of complication, uncontrolled    Unspecified asthma(493.90)    Unspecified disease of pericardium     Unspecified sleep apnea    Unspecified vitamin D  deficiency    Vaginitis and vulvovaginitis, unspecified    Venous insufficiency     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 chronic kidney disease (HCC) 10/24/2023   Secondary hyperparathyroidism of renal origin (HCC) 08/25/2022   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   Localized, 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/22/2021   Pain in joint of left shoulder 04/22/2021   Tendonitis of shoulder, left 04/22/2021   Hypertension associated with stage 3 chronic kidney disease due to type 2 diabetes mellitus (HCC) 02/06/2020   History of colonic polyps    Polyp of colon    Thoracic aortic atherosclerosis (HCC) 12/30/2016   Controlled type 2 diabetes mellitus with microalbuminuria, with long-term current use of insulin  (HCC) 12/23/2016   Venous insufficiency 05/21/2016   Hypertensive heart disease 05/21/2016   Anemia in chronic kidney disease 12/22/2015   Pulmonary hypertension (HCC) 12/18/2015   Xanthelasma 04/15/2015   Anxiety 04/12/2015   Chronic kidney disease (CKD), stage III (moderate) (HCC) 04/12/2015   Edema of extremities 04/12/2015   Disc disorder of lumbar region 04/12/2015   Asthma, moderate persistent 04/12/2015   Morbid obesity (HCC) 04/12/2015   Obstructive apnea 04/12/2015   Restless leg 04/12/2015   Allergic rhinitis 04/12/2015   Vitamin  D deficiency 04/12/2015   Controlled gout 04/12/2015   Premature atrial contractions 11/29/2013   Atrial fibrillation (HCC) 11/09/2013   Benign essential hypertension    Hyperlipidemia     Past Surgical History:  Procedure Laterality Date   CARDIAC CATHETERIZATION  2002   [facility]   CATARACT EXTRACTION W/PHACO Right 09/15/2021   Procedure: CATARACT EXTRACTION PHACO AND INTRAOCULAR LENS PLACEMENT (IOC) RIGHT 10.19 01:01.8;  Surgeon: Clair Crews, MD;  Location: [facility];  Service: Ophthalmology;  Laterality: Right;   CATARACT  EXTRACTION W/PHACO Left 09/29/2021   Procedure: CATARACT EXTRACTION PHACO AND INTRAOCULAR LENS PLACEMENT (IOC) LEFT DIABETIC 11.40 01:21.2;  Surgeon: Clair Crews, MD;  Location: [facility];  Service: Ophthalmology;  Laterality: Left;   COLONOSCOPY WITH PROPOFOL  N/A 09/25/2018   Procedure: COLONOSCOPY WITH PROPOFOL ;  Surgeon: Luke Salaam, MD;  Location: [facility];  Service: Gastroenterology;  Laterality: N/A;   COLONOSCOPY WITH PROPOFOL  N/A 04/13/2019   Procedure: COLONOSCOPY WITH PROPOFOL ;  Surgeon: Luke Salaam, MD;  Location: [facility] ENDOSCOPY;  Service: Gastroenterology;  Laterality: N/A;   COLONOSCOPY WITH PROPOFOL  N/A 06/27/2020   Procedure: COLONOSCOPY WITH PROPOFOL ;  Surgeon: Luke Salaam, MD;  Location: [facility];  Service: Gastroenterology;  Laterality: N/A;   COLONOSCOPY WITH PROPOFOL  N/A 08/02/2023   Procedure: COLONOSCOPY WITH PROPOFOL ;  Surgeon: Luke Salaam, MD;  Location: [facility] END[facility];  Service: Gastroenterology;  Laterality: N/A;   PERICARDIUM SURGERY     POLYPECTOMY  08/02/2023   Procedure: POLYPECTOMY;  Surgeon: Luke Salaam, MD;  Location: [facility] ENDOSCOPY;  Service: Gastroenterology;;    OB History   No obstetric history on file.      Home Medications    Prior to Admission medications   Medication Sig Start Date End Date Taking? Authorizing Provider  amoxicillin-clavulanate (AUGMENTIN) 500-125 MG tablet Take 1 tablet by mouth in the morning and at bedtime. 12/25/23  Yes Ashrith Sagan K, PA-C  benzonatate  (TESSALON ) 100 MG capsule Take 1 capsule (100 mg total) by mouth every 8 (eight) hours. 12/25/23  Yes Mikias Lanz K, PA-C  doxycycline (VIBRAMYCIN) 100 MG capsule Take 1 capsule (100 mg total) by mouth 2 (two) times daily. 12/25/23  Yes Leiliana Foody K, PA-C  acetaminophen  (TYLENOL ) 650 MG CR tablet Take 650 mg by mouth in the morning and at bedtime.    [provider]  albuterol  (VENTOLIN  HFA) 108 (90 Base) MCG/ACT inhaler Inhale 2 puffs into the lungs  every 6 (six) hours as needed for wheezing or shortness of breath. 06/24/23   Sowles, Krichna, MD  Alcohol Swabs (DROPSAFE ALCOHOL PREP) 70 % PADS USE AS DIRECTED 05/18/23   Sowles, Krichna, MD  allopurinol  (ZYLOPRIM ) 100 MG tablet TAKE 1 TABLET TWICE DAILY 08/02/23   Sowles, Krichna, MD  apixaban  (ELIQUIS ) 5 MG TABS tablet Take 1 tablet (5 mg total) by mouth 2 (two) times daily. 11/03/23   Wenona Hamilton, MD  Blood Glucose Calibration (TRUE METRIX LEVEL 1) Low SOLN 1 each by In Vitro route once a week. 12/12/17   Sowles, Krichna, MD  Blood Glucose Monitoring Suppl (TRUE METRIX AIR GLUCOSE METER) w/Device KIT Inject 1 each into the skin 4 (four) times daily as needed. Check fsbs 4 times daily E11.22 11/01/18   Sowles, Krichna, MD  Cholecalciferol  (VITAMIN D ) 2000 UNITS tablet Take 2,000 Units by mouth daily.    [provider]  colchicine  (COLCRYS ) 0.6 MG tablet Take 1-2 tablets (0.6-1.2 mg total) by mouth daily as needed. 11/25/17   Sowles, Krichna, MD  diltiazem  (CARDIZEM   CD) 240 MG 24 hr capsule TAKE 1 CAPSULE EVERY DAY 06/27/23   Wenona Hamilton, MD  DROPLET PEN NEEDLES 30G X 8 MM MISC USE TWICE DAILY WITH INSULIN  09/08/23   Sowles, Krichna, MD  Elastic Bandages & Supports (MEDICAL COMPRESSION STOCKINGS) MISC 2 each by Does not apply route daily. 04/20/16   Sowles, Krichna, MD  Ferrous Sulfate (IRON  SUPPLEMENT PO) Take 1 tablet by mouth daily.    [provider]  fluticasone  (FLONASE ) 50 MCG/ACT nasal spray USE 2 SPRAYS IN EACH NOSTRIL EVERY DAY 03/25/23   Sowles, Krichna, MD  fluticasone -salmeterol (WIXELA INHUB) 250-50 MCG/ACT AEPB Inhale 1 puff into the lungs in the morning and at bedtime. 06/24/23   Sowles, Krichna, MD  furosemide  (LASIX ) 20 MG tablet Take 1 tablet (20 mg total) by mouth 2 (two) times daily. 10/25/23   Sowles, Krichna, MD  HUMALOG  MIX 75/25 KWIKPEN (75-25) 100 UNIT/ML KwikPen DIAL AND INJECT 10 UNITS UNDER THE SKIN TWICE DAILY WITH A MEAL. MAX DAILY DOSE 20 UNITS.  DISCARD USED PEN AFTER 10 DAYS. 06/03/23   Sowles, Krichna, MD  irbesartan  (AVAPRO ) 300 MG tablet TAKE 1 TABLET EVERY DAY 12/21/23   Sowles, Krichna, MD  loratadine  (CLARITIN ) 10 MG tablet TAKE 1 TABLET EVERY DAY 12/15/23   Sowles, Krichna, MD  Magnesium Chloride 64 MG TBEC Take 1 tablet by mouth daily. 11/03/22   [provider]  pravastatin  (PRAVACHOL ) 80 MG tablet Take 1 tablet (80 mg total) by mouth every evening. 10/25/23   Sowles, Krichna, MD  TRUE METRIX BLOOD GLUCOSE TEST test strip CHECK BLOOD SUGAR FOUR TIMES DAILY 08/02/23   Sowles, Krichna, MD  TRUEplus Lancets 33G MISC TEST BLOOD SUGAR THREE TIMES DAILY AS NEEDED 03/25/23   Sowles, Krichna, MD  TRULICITY  3 MG/0.5ML SOAJ INJECT 3 MG (0.5 ML) UNDER THE SKIN ONCE A WEEK 09/12/23   Sowles, Krichna, MD    Family History Family History  Problem Relation Age of Onset   Heart attack Mother    Hypertension Mother    Bladder Cancer Father    Breast cancer Neg Hx     Social History Social History   Tobacco Use   Smoking status: Never    Passive exposure: Never   Smokeless tobacco: Never   Tobacco comments:    n/a  Vaping Use   Vaping status: Never Used  Substance Use Topics   Alcohol use: No    Alcohol/week: 0.0 standard drinks of alcohol   Drug use: No     Allergies   Farxiga  [dapagliflozin ]   Review of Systems Review of Systems  Constitutional:  Positive for activity change. Negative for appetite change, fatigue and fever.  HENT:  Positive for congestion. Negative for sinus pressure, sneezing and sore throat.   Respiratory:  Positive for cough, chest tightness, shortness of breath and wheezing.   Cardiovascular:  Negative for chest pain.  Gastrointestinal:  Negative for abdominal pain, diarrhea, nausea and vomiting.  Neurological:  Negative for dizziness, light-headedness and headaches.     Physical Exam Triage Vital Signs ED Triage Vitals  Encounter Vitals Group     BP 12/25/23 1047 (!) 157/75      Systolic BP Percentile --      Diastolic BP Percentile --      Pulse Rate 12/25/23 1047 96     Resp 12/25/23 1047 20     Temp 12/25/23 1047 100 F (37.8 C)     Temp src --      SpO2  12/25/23 1047 93 %     Weight --      Height --      Head Circumference --      Peak Flow --      Pain Score 12/25/23 1052 3     Pain Loc --      Pain Education --      Exclude from Growth Chart --    No data found.  Updated Vital Signs BP (!) 157/75   Pulse 78   Temp 100 F (37.8 C)   Resp 20   SpO2 98%   Visual Acuity Right Eye Distance:   Left Eye Distance:   Bilateral Distance:    Right Eye Near:   Left Eye Near:    Bilateral Near:     Physical Exam Vitals reviewed.  Constitutional:      General: She is awake. She is not in acute distress.    Appearance: Normal appearance. She is well-developed. She is not ill-appearing.     Comments: Very pleasant female appears stated age in no acute distress sitting comfortably in exam room  HENT:     Head: Normocephalic and atraumatic.     Right Ear: Tympanic membrane, ear canal and external ear normal. Tympanic membrane is not erythematous or bulging.     Left Ear: Ear canal and external ear normal. Tympanic membrane is scarred. Tympanic membrane is not erythematous or bulging.     Nose: Nose normal.     Mouth/Throat:     Pharynx: Uvula midline. Postnasal drip present. No oropharyngeal exudate or posterior oropharyngeal erythema.  Cardiovascular:     Rate and Rhythm: Normal rate and regular rhythm.     Heart sounds: Normal heart sounds, S1 normal and S2 normal. No murmur heard. Pulmonary:     Effort: Pulmonary effort is normal.     Breath sounds: Wheezing present. No rhonchi or rales.     Comments: Widespread wheezing Psychiatric:        Behavior: Behavior is cooperative.      UC Treatments / Results  Labs (all labs ordered are listed, but only abnormal results are displayed) Labs Reviewed  POC COVID19/FLU A&B COMBO - Normal  CBC  WITH DIFFERENTIAL/PLATELET  BASIC METABOLIC PANEL WITH GFR    EKG   Radiology DG Chest 2 View Result Date: 12/25/2023 CLINICAL DATA:  Acute cough. EXAM: CHEST - 2 VIEW COMPARISON:  08/30/2023 FINDINGS: The cardio pericardial silhouette is enlarged. Interstitial markings are diffusely coarsened with chronic features. Vascular congestion noted with potential dependent interstitial edema the lung bases. There is new airspace disease in the right lower lung suspicious for pneumonia. Bones are diffusely demineralized. IMPRESSION: 1. New airspace disease in the right lower lung suspicious for pneumonia. 2. Vascular congestion with potential dependent interstitial edema at the lung bases. Electronically Signed   By: Donnal Fusi M.D.   On: 12/25/2023 11:40    Procedures Procedures (including critical care time)  Medications Ordered in UC Medications  ipratropium-albuterol  (DUONEB) 0.5-2.5 (3) MG/3ML nebulizer solution 3 mL (3 mLs Nebulization Given 12/25/23 1110)    Initial Impression / Assessment and Plan / UC Course  I have reviewed the triage vital signs and the nursing notes.  Pertinent labs & imaging results that were available during my care of the patient were reviewed by me and considered in my medical decision making (see chart for details).     Patient is well-appearing, afebrile, nontoxic, nontachycardic.  She initially had an oxygen saturation of 93%  but was given DuoNeb in clinic and this improved to 98%.  She was negative for flu and COVID.  Chest x-ray was obtained that showed right lower lobe pneumonia.  We discussed that given she is over the age of 67 she would potentially be a candidate for admission particular given her significant past medical history, however, she preferred to try outpatient treatment and so declined ER evaluation at this time.  CBC and CMP were obtained and if she has significant leukocytosis or abnormal kidney function we would need to reevaluate going to  the ER.  Will start Augmentin and doxycycline twice daily for 10 days for treatment of CAP.  No indication for dose adjustment based on metabolic panel from 10/10/2023 with creatinine of 1.17 and calculated creatinine clearance of 109.48 mL/min.  She was encouraged use over-the-counter medication including Mucinex and Tylenol .  She was given Tessalon  to help with cough.  She has a pulse oximeter at home and was encouraged to monitor oxygen saturation and seek immediate medical attention with readings consistently below 93% and going to the ER with readings below 90%.  Will continue using albuterol  at home to help manage her symptoms.  Discussed that she should follow-up with her PCP soon as possible but preferably within the next 1 to 2 days.  Discussed that if anything worsens or changes she must go to the ER immediately to which she expressed understanding.  All questions were answered patient satisfaction.  Strict return precautions were given.  Final Clinical Impressions(s) / UC Diagnoses   Final diagnoses:  Acute cough  Pneumonia of right lower lobe due to infectious organism     Discharge Instructions      We are treating you for pneumonia.  Start Augmentin twice daily for 10 days.  Start doxycycline 100 mg for 10 days.  We will contact you.  Your blood work is abnormal.  Please follow-up with your primary care tomorrow or the next day (as soon as possible).  Monitor your oxygen saturation at home and if this drops below 93% you should be reevaluated and if below 90% go to the hospital.  Have a low threshold for going to the hospital with any changing or worsening symptoms including chest pain, shortness of breath, fever, nausea/vomiting, weakness.  It is very important that you follow-up within the next 1 to 2 days to ensure that you are improving.     ED Prescriptions     Medication Sig Dispense Auth. Provider   amoxicillin-clavulanate (AUGMENTIN) 500-125 MG tablet Take 1 tablet by mouth  in the morning and at bedtime. 20 tablet Nola Botkins K, PA-C   benzonatate  (TESSALON ) 100 MG capsule Take 1 capsule (100 mg total) by mouth every 8 (eight) hours. 21 capsule Viraat Vanpatten K, PA-C   doxycycline (VIBRAMYCIN) 100 MG capsule Take 1 capsule (100 mg total) by mouth 2 (two) times daily. 20 capsule Holle Sprick K, PA-C      PDMP not reviewed this encounter.   Budd Cargo, PA-C 12/25/23 1155

## 2023-12-25 NOTE — Discharge Instructions (Signed)
 We are treating you for pneumonia.  Start Augmentin twice daily for 10 days.  Start doxycycline 100 mg for 10 days.  We will contact you.  Your blood work is abnormal.  Please follow-up with your primary care tomorrow or the next day (as soon as possible).  Monitor your oxygen saturation at home and if this drops below 93% you should be reevaluated and if below 90% go to the hospital.  Have a low threshold for going to the hospital with any changing or worsening symptoms including chest pain, shortness of breath, fever, nausea/vomiting, weakness.  It is very important that you follow-up within the next 1 to 2 days to ensure that you are improving.

## 2023-12-26 ENCOUNTER — Telehealth: Payer: Self-pay | Admitting: Oncology

## 2023-12-26 ENCOUNTER — Inpatient Hospital Stay: Admitting: Oncology

## 2023-12-26 ENCOUNTER — Inpatient Hospital Stay

## 2023-12-26 NOTE — Telephone Encounter (Signed)
 Amber Reyes called and left a message with answering service to cancel her appt today for MD and injection. She is sick with pneumonia. Team was notified.

## 2023-12-26 NOTE — Telephone Encounter (Signed)
 Spoke with pt and appts are r/s

## 2023-12-27 ENCOUNTER — Other Ambulatory Visit: Payer: Self-pay | Admitting: Family Medicine

## 2023-12-27 ENCOUNTER — Encounter: Payer: Self-pay | Admitting: Family Medicine

## 2023-12-27 ENCOUNTER — Ambulatory Visit: Admitting: Family Medicine

## 2023-12-27 VITALS — BP 158/82 | HR 90 | Resp 18 | Ht 70.0 in | Wt 351.6 lb

## 2023-12-27 DIAGNOSIS — N1831 Chronic kidney disease, stage 3a: Secondary | ICD-10-CM

## 2023-12-27 DIAGNOSIS — J189 Pneumonia, unspecified organism: Secondary | ICD-10-CM

## 2023-12-27 DIAGNOSIS — J4541 Moderate persistent asthma with (acute) exacerbation: Secondary | ICD-10-CM | POA: Diagnosis not present

## 2023-12-27 LAB — CBC WITH DIFFERENTIAL/PLATELET
Basophils Absolute: 0.1 10*3/uL (ref 0.0–0.2)
Basos: 1 %
EOS (ABSOLUTE): 0.1 10*3/uL (ref 0.0–0.4)
Eos: 1 %
Hematocrit: 31.2 % — ABNORMAL LOW (ref 34.0–46.6)
Hemoglobin: 9.6 g/dL — ABNORMAL LOW (ref 11.1–15.9)
Immature Grans (Abs): 0 10*3/uL (ref 0.0–0.1)
Immature Granulocytes: 0 %
Lymphocytes Absolute: 0.6 10*3/uL — ABNORMAL LOW (ref 0.7–3.1)
Lymphs: 8 %
MCH: 27.2 pg (ref 26.6–33.0)
MCHC: 30.8 g/dL — ABNORMAL LOW (ref 31.5–35.7)
MCV: 88 fL (ref 79–97)
Monocytes Absolute: 0.7 10*3/uL (ref 0.1–0.9)
Monocytes: 8 %
Neutrophils Absolute: 6.8 10*3/uL (ref 1.4–7.0)
Neutrophils: 82 %
Platelets: 192 10*3/uL (ref 150–450)
RBC: 3.53 x10E6/uL — ABNORMAL LOW (ref 3.77–5.28)
RDW: 14.5 % (ref 11.7–15.4)
WBC: 8.3 10*3/uL (ref 3.4–10.8)

## 2023-12-27 LAB — BASIC METABOLIC PANEL WITH GFR
BUN/Creatinine Ratio: 20 (ref 12–28)
BUN: 32 mg/dL — ABNORMAL HIGH (ref 8–27)
CO2: 19 mmol/L — ABNORMAL LOW (ref 20–29)
Calcium: 9.1 mg/dL (ref 8.7–10.3)
Chloride: 98 mmol/L (ref 96–106)
Creatinine, Ser: 1.59 mg/dL — ABNORMAL HIGH (ref 0.57–1.00)
Glucose: 152 mg/dL — ABNORMAL HIGH (ref 70–99)
Potassium: 5.5 mmol/L — ABNORMAL HIGH (ref 3.5–5.2)
Sodium: 132 mmol/L — ABNORMAL LOW (ref 134–144)
eGFR: 34 mL/min/{1.73_m2} — ABNORMAL LOW (ref >59)

## 2023-12-27 MED ORDER — PREDNISONE 10 MG PO TABS: 10.0000 mg | ORAL_TABLET | Freq: Two times a day (BID) | ORAL | 0 refills | Status: DC

## 2023-12-27 MED ORDER — HYDROCOD POLI-CHLORPHE POLI ER 10-8 MG/5ML PO SUER: 5.0000 mL | Freq: Every evening | ORAL | 0 refills | Status: DC | PRN

## 2023-12-27 NOTE — Progress Notes (Signed)
 Name: BECKI CORNELY   MRN: [id number]    DOB: Oct 20, 1945   Date:12/27/2023       Progress Note  Subjective  Chief Complaint  Chief Complaint  Patient presents with   Pneumonia    Feeling better but still congested and cough worst at night   Discussed the use of AI scribe software for clinical note transcription with the patient, who gave verbal consent to proceed.  History of Present Illness NOEHMI COTT is a 78 year old female with asthma and chronic kidney disease who presents with respiratory symptoms and a recent diagnosis of pneumonia.  She experienced cold symptoms, including chest congestion and coughing, for a couple of days prior to visiting urgent care on May 4th. She also had shortness of breath and wheezing. She attempted to use her asthma medications but had run out of Advair, which she received a day before her urgent care visit. Despite resuming Advair, her symptoms did not improve initially.  At urgent care, she was diagnosed with pneumonia, specifically right lower lobe  pneumonia. A chest X-ray showed an abnormality in her right lower lung. The cough is persistent, especially at night, and she describes it as 'like my inside coming out.'  She has been taking antibiotics for two days and reports feeling better, although the cough persists. She uses her CPAP machine at night and elevates her head with two pillows to aid breathing. Her oxygen saturation has been running at 97%.  Recent lab results showed a drop in kidney function with an EGFR of 34, slightly elevated potassium, low sodium, and a white blood cell count of 8.3. She has stable anemia with a hemoglobin of 9.6. COVID and flu tests were negative.  She has a history of asthma and uses a rescue inhaler  She has previously used prednisone  for asthma exacerbations. She also manages diabetes, with her blood sugar running at 107 this morning.    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   Localized, 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/22/2021   Pain in joint of left shoulder 04/22/2021   Tendonitis of shoulder, left 04/22/2021   Hypertension associated with stage 3 chronic kidney disease due to type 2 diabetes mellitus (HCC) 02/06/2020   History of colonic polyps    Polyp of colon    Thoracic aortic atherosclerosis (HCC) 12/30/2016   Controlled type 2 diabetes mellitus with microalbuminuria, with long-term current use of insulin  (HCC) 12/23/2016   Venous insufficiency 05/21/2016   Hypertensive heart disease 05/21/2016   Anemia in chronic kidney disease 12/22/2015   Pulmonary hypertension (HCC) 12/18/2015   Xanthelasma 04/15/2015   Anxiety 04/12/2015   Chronic kidney disease (CKD), stage III (moderate) (HCC) 04/12/2015   Edema of extremities 04/12/2015   Disc disorder of lumbar region 04/12/2015   Asthma, moderate persistent 04/12/2015   Morbid obesity (HCC) 04/12/2015   Obstructive apnea 04/12/2015   Restless leg 04/12/2015   Allergic rhinitis 04/12/2015   Vitamin D  deficiency 04/12/2015   Controlled gout 04/12/2015   Premature atrial contractions 11/29/2013   Atrial fibrillation (HCC) 11/09/2013   Benign essential hypertension    Hyperlipidemia     Past Surgical History:  Procedure Laterality Date   CARDIAC CATHETERIZATION  2002   [facility]  CATARACT EXTRACTION W/PHACO Right 09/15/2021   Procedure: CATARACT EXTRACTION PHACO AND INTRAOCULAR LENS PLACEMENT (IOC) RIGHT 10.19 01:01.8;  Surgeon: Clair Crews, MD;  Location: [facility];  Service: Ophthalmology;  Laterality: Right;   CATARACT EXTRACTION W/PHACO Left 09/29/2021   Procedure: CATARACT EXTRACTION PHACO AND INTRAOCULAR LENS PLACEMENT (IOC) LEFT DIABETIC 11.40  01:21.2;  Surgeon: Clair Crews, MD;  Location: [facility];  Service: Ophthalmology;  Laterality: Left;   COLONOSCOPY WITH PROPOFOL  N/A 09/25/2018   Procedure: COLONOSCOPY WITH PROPOFOL ;  Surgeon: Luke Salaam, MD;  Location: [facility];  Service: Gastroenterology;  Laterality: N/A;   COLONOSCOPY WITH PROPOFOL  N/A 04/13/2019   Procedure: COLONOSCOPY WITH PROPOFOL ;  Surgeon: Luke Salaam, MD;  Location: [facility] ENDOSCOPY;  Service: Gastroenterology;  Laterality: N/A;   COLONOSCOPY WITH PROPOFOL  N/A 06/27/2020   Procedure: COLONOSCOPY WITH PROPOFOL ;  Surgeon: Luke Salaam, MD;  Location: [facility] ENDOSCOPY;  Service: Gastroenterology;  Laterality: N/A;   COLONOSCOPY WITH PROPOFOL  N/A 08/02/2023   Procedure: COLONOSCOPY WITH PROPOFOL ;  Surgeon: Luke Salaam, MD;  Location: [facility] END[facility]COPY;  Service: Gastroenterology;  Laterality: N/A;   PERICARDIUM SURGERY     POLYPECTOMY  08/02/2023   Procedure: POLYPECTOMY;  Surgeon: Luke Salaam, MD;  Location: [facility] END[facility]COPY;  Service: Gastroenterology;;    Family History  Problem Relation Age of Onset   Heart attack Mother    Hypertension Mother    Bladder Cancer Father    Breast cancer Neg Hx     Social History   Tobacco Use   Smoking status: Never    Passive exposure: Never   Smokeless tobacco: Never   Tobacco comments:    n/a  Substance Use Topics   Alcohol use: No    Alcohol/week: 0.0 standard drinks of alcohol     Current Outpatient Medications:    acetaminophen  (TYLENOL ) 650 MG CR tablet, Take 650 mg by mouth in the morning and at bedtime., Disp: , Rfl:    albuterol  (VENTOLIN  HFA) 108 (90 Base) MCG/ACT inhaler, Inhale 2 puffs into the lungs every 6 (six) hours as needed for wheezing or shortness of breath., Disp: 17 each, Rfl: 0   Alcohol Swabs (DROPSAFE ALCOHOL PREP) 70 % PADS, USE AS DIRECTED, Disp: 300 each, Rfl: 3   allopurinol  (ZYLOPRIM ) 100 MG tablet, TAKE 1 TABLET TWICE DAILY, Disp: 180 tablet, Rfl: 3   amoxicillin-clavulanate  (AUGMENTIN) 500-125 MG tablet, Take 1 tablet by mouth in the morning and at bedtime., Disp: 20 tablet, Rfl: 0   apixaban  (ELIQUIS ) 5 MG TABS tablet, Take 1 tablet (5 mg total) by mouth 2 (two) times daily., Disp: 60 tablet, Rfl: 5   benzonatate  (TESSALON ) 100 MG capsule, Take 1 capsule (100 mg total) by mouth every 8 (eight) hours., Disp: 21 capsule, Rfl: 0   Blood Glucose Calibration (TRUE METRIX LEVEL 1) Low SOLN, 1 each by In Vitro route once a week., Disp: 1 each, Rfl: 1   Blood Glucose Monitoring Suppl (TRUE METRIX AIR GLUCOSE METER) w/Device KIT, Inject 1 each into the skin 4 (four) times daily as needed. Check fsbs 4 times daily E11.22, Disp: 1 kit, Rfl: 0   chlorpheniramine-HYDROcodone (TUSSIONEX) 10-8 MG/5ML, Take 5 mLs by mouth at bedtime as needed for cough., Disp: 115 mL, Rfl: 0   Cholecalciferol  (VITAMIN D ) 2000 UNITS tablet, Take 2,000 Units by mouth daily., Disp: , Rfl:    colchicine  (COLCRYS ) 0.6 MG tablet, Take 1-2 tablets (0.6-1.2 mg total) by mouth daily as needed., Disp: 30 tablet, Rfl: 0   diltiazem  (CARDIZEM  CD)  240 MG 24 hr capsule, TAKE 1 CAPSULE EVERY DAY, Disp: 90 capsule, Rfl: 3   doxycycline (VIBRAMYCIN) 100 MG capsule, Take 1 capsule (100 mg total) by mouth 2 (two) times daily., Disp: 20 capsule, Rfl: 0   DROPLET PEN NEEDLES 30G X 8 MM MISC, USE TWICE DAILY WITH INSULIN , Disp: 200 each, Rfl: 3   Elastic Bandages & Supports (MEDICAL COMPRESSION STOCKINGS) MISC, 2 each by Does not apply route daily., Disp: 2 each, Rfl: 2   Ferrous Sulfate (IRON  SUPPLEMENT PO), Take 1 tablet by mouth daily., Disp: , Rfl:    fluticasone  (FLONASE ) 50 MCG/ACT nasal spray, USE 2 SPRAYS IN EACH NOSTRIL EVERY DAY, Disp: 48 g, Rfl: 3   fluticasone -salmeterol (WIXELA INHUB) 250-50 MCG/ACT AEPB, Inhale 1 puff into the lungs in the morning and at bedtime., Disp: 180 each, Rfl: 1   furosemide  (LASIX ) 20 MG tablet, Take 1 tablet (20 mg total) by mouth 2 (two) times daily., Disp: 180 tablet, Rfl: 1    HUMALOG  MIX 75/25 KWIKPEN (75-25) 100 UNIT/ML KwikPen, DIAL AND INJECT 10 UNITS UNDER THE SKIN TWICE DAILY WITH A MEAL. MAX DAILY DOSE 20 UNITS. DISCARD USED PEN AFTER 10 DAYS., Disp: 45 mL, Rfl: 0   irbesartan  (AVAPRO ) 300 MG tablet, TAKE 1 TABLET EVERY DAY, Disp: 90 tablet, Rfl: 0   loratadine  (CLARITIN ) 10 MG tablet, TAKE 1 TABLET EVERY DAY, Disp: 90 tablet, Rfl: 3   Magnesium Chloride 64 MG TBEC, Take 1 tablet by mouth daily., Disp: , Rfl:    pravastatin  (PRAVACHOL ) 80 MG tablet, Take 1 tablet (80 mg total) by mouth every evening., Disp: 90 tablet, Rfl: 1   predniSONE  (DELTASONE ) 10 MG tablet, Take 1 tablet (10 mg total) by mouth 2 (two) times daily with a meal., Disp: 10 tablet, Rfl: 0   TRUE METRIX BLOOD GLUCOSE TEST test strip, CHECK BLOOD SUGAR FOUR TIMES DAILY, Disp: 400 strip, Rfl: 3   TRUEplus Lancets 33G MISC, TEST BLOOD SUGAR THREE TIMES DAILY AS NEEDED, Disp: 300 each, Rfl: 3   TRULICITY  3 MG/0.5ML SOAJ, INJECT 3 MG (0.5 ML) UNDER THE SKIN ONCE A WEEK, Disp: 8 mL, Rfl: 0  Allergies  Allergen Reactions   Farxiga  [Dapagliflozin ] Hives    I personally reviewed active problem list, medication list, allergies, family history with the patient/caregiver today.   ROS  Ten systems reviewed and is negative except as mentioned in HPI    Objective Physical Exam Constitutional: Patient appears well-developed and well-nourished. Obese  No distress.  HEENT: head atraumatic, normocephalic, pupils equal and reactive to light, neck supple Cardiovascular: Normal rate, regular rhythm and normal heart sounds.  No murmur heard. No BLE edema. Pulmonary/Chest: Effort normal , rhonchi on both lung fields. No respiratory distress. Abdominal: Soft.  There is no tenderness. Psychiatric: Patient has a normal mood and affect. behavior is normal. Judgment and thought content normal.     Vitals:   12/27/23 1407 12/27/23 1438  BP: (!) 150/82 (!) 158/82  Pulse: 90   Resp: 18   SpO2: 100%   Weight:  (!) 351 lb 9.6 oz (159.5 kg)   Height: 5\' 10"  (1.778 m)     Body mass index is 50.45 kg/m.  Recent Results (from the past 2160 hours)  Microalbumin / creatinine urine ratio     Status: None   Collection Time: 10/10/23 12:00 AM  Result Value Ref Range   Microalb Creat Ratio 1.107     Comment: [facility]  POCT glycosylated hemoglobin (Hb A1C)  Status: Abnormal   Collection Time: 10/25/23  8:03 AM  Result Value Ref Range   Hemoglobin A1C 7.1 (A) 4.0 - 5.6 %   HbA1c POC (<> result, manual entry)     HbA1c, POC (prediabetic range)     HbA1c, POC (controlled diabetic range)    Parathyroid  hormone, intact (no Ca)     Status: Abnormal   Collection Time: 10/25/23  8:50 AM  Result Value Ref Range   PTH 80 (H) 16 - 77 pg/mL    Comment: . Interpretive Guide    Intact PTH           Calcium  ------------------    ----------           ------- Normal Parathyroid     Normal               Normal Hypoparathyroidism    Low or Low Normal    Low Hyperparathyroidism    Primary            Normal or High       High    Secondary          High                 Normal or Low    Tertiary           High                 High Non-Parathyroid     Hypercalcemia      Low or Low Normal    High .   Lipid panel     Status: None   Collection Time: 10/25/23  8:50 AM  Result Value Ref Range   Cholesterol 112 <200 mg/dL   HDL 52 > OR = 50 mg/dL   Triglycerides 67 <782 mg/dL   LDL Cholesterol (Calc) 46 mg/dL (calc)    Comment: Reference range: <100 . Desirable range <100 mg/dL for primary prevention;   <70 mg/dL for patients with CHD or diabetic patients  with > or = 2 CHD risk factors. Aaron Aas LDL-C is now calculated using the Martin-Hopkins  calculation, which is a validated novel method providing  better accuracy than the Friedewald equation in the  estimation of LDL-C.  Melinda Sprawls et al. Erroll Heard. 9562;130(86): 2061-2068  (http://education.[facility].com/faq/FAQ164)    Total CHOL/HDL Ratio 2.2 <5.0 (calc)    Non-HDL Cholesterol (Calc) 60 <578 mg/dL (calc)    Comment: For patients with diabetes plus 1 major ASCVD risk  factor, treating to a non-HDL-C goal of <100 mg/dL  (LDL-C of <46 mg/dL) is considered a therapeutic  option.   COMPLETE METABOLIC PANEL WITH GFR     Status: Abnormal   Collection Time: 10/25/23  8:50 AM  Result Value Ref Range   Glucose, Bld 146 (H) 65 - 99 mg/dL    Comment: .            Fasting reference interval . For someone without known diabetes, a glucose value >125 mg/dL indicates that they may have diabetes and this should be confirmed with a follow-up test. .    BUN 29 (H) 7 - 25 mg/dL   Creat 9.62 (H) 9.52 - 1.00 mg/dL   eGFR 45 (L) > OR = 60 mL/min/1.78m2   BUN/Creatinine Ratio 23 (H) 6 - 22 (calc)   Sodium 140 135 - 146 mmol/L   Potassium 4.0 3.5 - 5.3 mmol/L   Chloride 106 98 - 110 mmol/L   CO2 23 20 - 32  mmol/L   Calcium  8.8 8.6 - 10.4 mg/dL   Total Protein 6.7 6.1 - 8.1 g/dL   Albumin 3.8 3.6 - 5.1 g/dL   Globulin 2.9 1.9 - 3.7 g/dL (calc)   AG Ratio 1.3 1.0 - 2.5 (calc)   Total Bilirubin 0.5 0.2 - 1.2 mg/dL   Alkaline phosphatase (APISO) 81 37 - 153 U/L   AST 14 10 - 35 U/L   ALT 6 6 - 29 U/L  VITAMIN D  25 Hydroxy (Vit-D Deficiency, Fractures)     Status: None   Collection Time: 10/25/23  8:50 AM  Result Value Ref Range   Vit D, 25-Hydroxy 54 30 - 100 ng/mL    Comment: Vitamin D  Status         25-OH Vitamin D : . Deficiency:                    <20 ng/mL Insufficiency:             20 - 29 ng/mL Optimal:                 > or = 30 ng/mL . For 25-OH Vitamin D  testing on patients on  D2-supplementation and patients for whom quantitation  of D2 and D3 fractions is required, the [employer]ureD(TM) 25-OH VIT D, (D2,D3), LC/MS/MS is recommended: order  code 78469 (patients >43yrs). . See Note 1 . Note 1 . For additional information, please refer to  http://education.[facility].com/faq/FAQ199  (This link is being provided for  informational/ educational purposes only.)   Surgical pathology     Status: None   Collection Time: 11/01/23 12:00 AM  Result Value Ref Range   SURGICAL PATHOLOGY      SURGICAL PATHOLOGY [facility] [address] [place], [place] [address] Telephone [phone] or [phone] Fax [phone]  REPORT OF SURGICAL PATHOLOGY   Accession #: [id number] Patient Name: HELA, BRUEGGEMAN Visit # : [id number]  MRN: [id number] Physician: Flynn Hylan DOB/Age 08-Feb-1946 (Age: 44) Gender: F Collected Date: 11/01/2023 Received Date: 11/01/2023  FINAL DIAGNOSIS       1. Cyst, excision, Sebaceous cyst on neck :       EPIDERMAL INCLUSION CYST.      NEGATIVE FOR MALIGNANCY.       DATE SIGNED OUT: 11/02/2023 ELECTRONIC SIGNATURE : Dillard Frame Md, Pathologist, Electronic Signature  MICROSCOPIC DESCRIPTION  CASE COMMENTS STAINS USED IN DIAGNOSIS: H&E    CLINICAL HISTORY  SPECIMEN(S) OBTAINED 1. Cyst, excision, Sebaceous Cyst On Neck  SPECIMEN COMMENTS: SPECIMEN CLINICAL INFORMATION: 1. Sebaceous cyst    Gross Description 1. Received in formalin is a 3.0 x 1.9 x 1.5 cm intact cystic str ucture with a minimal amount of attached tan, wrinkled skin.  Sectioning reveals an ample amount of tan-brown contents and a smooth inner wall that has a thickness of 0.1 cm.  No further distinct lesions/excrescences are grossly identified.  A representative cross section is submitted in one block.   (KW:kh 11/02/23)        Report signed out from the following location(s) [place] [address] [place], [place] [address] CLIA #: [id number]  [facility] [address] [place], [place] [address] CLIA #: [id number]   Retic Panel     Status: Abnormal   Collection Time: 12/19/23  8:23 AM  Result Value Ref Range   Retic Ct Pct 1.7 0.4 - 3.1 %   RBC. 3.36 (L) 3.87 - 5.11 MIL/uL  Retic Count, Absolute 55.4  19.0 - 186.0 K/uL   Immature Retic Fract 19.2 (H) 2.3 - 15.9 %   Reticulocyte Hemoglobin 30.7 >27.9 pg    Comment: Performed at [facility], [address] [place], [place] [address]  Ferritin     Status: None   Collection Time: 12/19/23  8:23 AM  Result Value Ref Range   Ferritin 127 11 - 307 ng/mL    Comment: Performed at [facility], [address] [place], [place] [address]  Iron  and TIBC     Status: None   Collection Time: 12/19/23  8:23 AM  Result Value Ref Range   Iron  50 28 - 170 ug/dL   TIBC 098 119 - 147 ug/dL   Saturation Ratios 17 10.4 - 31.8 %   UIBC 241 ug/dL    Comment: Performed at [facility], [address] [place], [place] [address]  Multiple Myeloma Panel (SPEP&IFE w/QIG)     Status: None   Collection Time: 12/19/23  8:23 AM  Result Value Ref Range   IgG (Immunoglobin G), Serum 1,425 586 - 1,602 mg/dL   IgA 213 64 - 086 mg/dL   IgM (Immunoglobulin M), Srm 183 26 - 217 mg/dL   Total Protein ELP 6.9 6.0 - 8.5 g/dL   Albumin SerPl Elph-Mcnc 3.4 2.9 - 4.4 g/dL   Alpha 1 0.3 0.0 - 0.4 g/dL   Alpha2 Glob SerPl Elph-Mcnc 0.8 0.4 - 1.0 g/dL   B-Globulin SerPl Elph-Mcnc 1.1 0.7 - 1.3 g/dL   Gamma Glob SerPl Elph-Mcnc 1.4 0.4 - 1.8 g/dL   M Protein SerPl Elph-Mcnc Not Observed Not Observed g/dL   Globulin, Total 3.5 2.2 - 3.9 g/dL   Albumin/Glob SerPl 1.0 0.7 - 1.7   IFE 1 Comment     Comment: (NOTE) The immunofixation pattern appears unremarkable. Evidence of monoclonal protein is not apparent.    Please Note Comment     Comment: (NOTE) Protein electrophoresis scan will follow via computer, mail, or courier delivery. Performed At: [facility] [address] [place], [place] [id number] Pearlean Botts MD [id number]   Kappa/lambda light chains     Status: Abnormal   Collection Time: 12/19/23  8:23 AM  Result Value Ref Range   Kappa free light chain 59.7 (H) 3.3 - 19.4 mg/L   Lambda free light chains 42.5 (H) 5.7 -  26.3 mg/L   Kappa, lambda light chain ratio 1.40 0.26 - 1.65    Comment: (NOTE) Performed At: [facility] [address] [place], [place] [id number] Pearlean Botts MD IH:[id number]   CBC with Differential (Cancer Center Only)     Status: Abnormal   Collection Time: 12/19/23  8:23 AM  Result Value Ref Range   WBC Count 7.4 4.0 - 10.5 K/uL   RBC 3.36 (L) 3.87 - 5.11 MIL/uL   Hemoglobin 9.5 (L) 12.0 - 15.0 g/dL   HCT 75.6 (L) 43.3 - 29.5 %   MCV 89.6 80.0 - 100.0 fL   MCH 28.3 26.0 - 34.0 pg   MCHC 31.6 30.0 - 36.0 g/dL   RDW 18.8 (H) 41.6 - 60.6 %   Platelet Count 191 150 - 400 K/uL   nRBC 0.0 0.0 - 0.2 %   Neutrophils Relative % 69 %   Neutro Abs 5.1 1.7 - 7.7 K/uL   Lymphocytes Relative 17 %   Lymphs Abs 1.3 0.7 - 4.0 K/uL   Monocytes Relative 8 %   Monocytes Absolute 0.6 0.1 - 1.0  K/uL   Eosinophils Relative 4 %   Eosinophils Absolute 0.3 0.0 - 0.5 K/uL   Basophils Relative 1 %   Basophils Absolute 0.1 0.0 - 0.1 K/uL   Immature Granulocytes 1 %   Abs Immature Granulocytes 0.09 (H) 0.00 - 0.07 K/uL    Comment: Performed at [facility], [address] [place], [place] [address]  POC Covid19/Flu A&B Antigen     Status: Normal   Collection Time: 12/25/23 11:12 AM  Result Value Ref Range   Influenza A Antigen, POC Negative    Influenza B Antigen, POC Negative    Covid Antigen, POC Negative   CBC with Differential/Platelet     Status: Abnormal   Collection Time: 12/25/23 11:53 AM  Result Value Ref Range   WBC 8.3 3.4 - 10.8 x10E3/uL   RBC 3.53 (L) 3.77 - 5.28 x10E6/uL   Hemoglobin 9.6 (L) 11.1 - 15.9 g/dL   Hematocrit 47.4 (L) 25.9 - 46.6 %   MCV 88 79 - 97 fL   MCH 27.2 26.6 - 33.0 pg   MCHC 30.8 (L) 31.5 - 35.7 g/dL   RDW 56.3 87.5 - 64.3 %   Platelets 192 150 - 450 x10E3/uL   Neutrophils 82 Not Estab. %   Lymphs 8 Not Estab. %   Monocytes 8 Not Estab. %   Eos 1 Not Estab. %   Basos 1 Not Estab. %   Neutrophils Absolute 6.8 1.4 - 7.0 x10E3/uL    Lymphocytes Absolute 0.6 (L) 0.7 - 3.1 x10E3/uL   Monocytes Absolute 0.7 0.1 - 0.9 x10E3/uL   EOS (ABSOLUTE) 0.1 0.0 - 0.4 x10E3/uL   Basophils Absolute 0.1 0.0 - 0.2 x10E3/uL   Immature Granulocytes 0 Not Estab. %   Immature Grans (Abs) 0.0 0.0 - 0.1 x10E3/uL  Basic metabolic panel     Status: Abnormal   Collection Time: 12/25/23 11:53 AM  Result Value Ref Range   Glucose 152 (H) 70 - 99 mg/dL   BUN 32 (H) 8 - 27 mg/dL   Creatinine, Ser 3.29 (H) 0.57 - 1.00 mg/dL   eGFR 34 (L) >51 OA/CZY/6.06   BUN/Creatinine Ratio 20 12 - 28   Sodium 132 (L) 134 - 144 mmol/L   Potassium 5.5 (H) 3.5 - 5.2 mmol/L   Chloride 98 96 - 106 mmol/L   CO2 19 (L) 20 - 29 mmol/L   Calcium  9.1 8.7 - 10.3 mg/dL    Diabetic Foot Exam:     PHQ2/9:    12/27/2023    2:08 PM 10/25/2023    8:02 AM 08/30/2023   10:46 AM 07/07/2023    3:29 PM 06/24/2023    8:55 AM  Depression screen PHQ 2/9  Decreased Interest 0 0 0 0 0  Down, Depressed, Hopeless 0 0 0 0 0  PHQ - 2 Score 0 0 0 0 0  Altered sleeping  0 0 0 0  Tired, decreased energy  0 0 0 0  Change in appetite  0 0 0 0  Feeling bad or failure about yourself   0 0 0 0  Trouble concentrating  0 0 0 0  Moving slowly or fidgety/restless  0 0 0 0  Suicidal thoughts  0 0 0 0  PHQ-9 Score  0 0 0 0  Difficult doing work/chores  Not difficult at all       phq 9 is negative  Fall Risk:    10/27/2023    1:49 PM 08/30/2023   10:46 AM  07/07/2023    3:31 PM 06/24/2023    8:55 AM 05/10/2023    8:06 AM  Fall Risk   Falls in the past year? 0 0 0 0 0  Number falls in past yr: 0 0 0  0  Injury with Fall? 0 0 0  0  Risk for fall due to :  No Fall Risks No Fall Risks No Fall Risks No Fall Risks  Follow up  Falls prevention discussed;Education provided;Falls evaluation completed Falls prevention discussed;Falls evaluation completed Falls prevention discussed Falls prevention discussed      Assessment & Plan Asthma with acute exacerbation Asthma exacerbation due to  Advair noncompliance. Current treatment includes albuterol  and CPAP. Prednisone  prescribed for inflammation. Spacer use discussed. Advised against nebulizer to prevent overdose. - Prescribe prednisone  10 mg twice daily with food, avoid before bed. - Instruct on albuterol  use with spacer every 3-4 hours as needed. - Continue CPAP at night. - Educate on proper spacer use.  Pneumonia  CAP - right lower lung involvement. Treated with doxycycline and amoxicillin-clavulanate. Symptoms improving but cough persists. Advised to monitor oxygen levels and seek care if below 90%. - Continue doxycycline 100 mg twice daily. - Continue amoxicillin-clavulanate 500/125 mg twice daily for 10 days. - Prescribe tussionex for nighttime cough, caution about drowsiness and respiratory suppression. - Advise on Mucinex for chest secretions. - Schedule chest x-ray mid-June.  Chronic kidney disease, stage 3 CKD stage 3 with eGFR drop from 45 to 34. Potassium elevated, sodium low, likely due to lung infection. Anemia likely related to CKD. - Monitor kidney function and electrolytes.  Anemia in chronic kidney disease Anemia with hemoglobin at 9.6 g/dL, likely secondary to CKD. - Monitor hemoglobin levels.  Type 2 diabetes mellitus without complications Diabetes well-controlled with blood sugar at 107 mg/dL. - Continue current diabetes management plan.

## 2023-12-29 ENCOUNTER — Ambulatory Visit: Payer: Self-pay

## 2023-12-29 NOTE — Telephone Encounter (Signed)
 Copied from CRM [id number]. Topic: Clinical - Pink Word Triage >> Dec 29, 2023  3:28 PM Fredrica W wrote: Reason for Triage: Patient called - states provider prescribed predniSONE  (DELTASONE ) 10 MG tablet and did say it may spike her blood sugar. It is time for her to take her next dose and she just checked sugar in 235. She does not know how to proceed. If she should continue medication or if she needs to increase insulin . Thank You  Chief Complaint: elevated bgl 235 and is on prednisone .  Would like to know if she should increase her insulin  Symptoms: lightheaded at times Frequency: comes and goes Pertinent Negatives: Patient denies cp, sob,  Disposition: [] ED /[] Urgent Care (no appt availability in office) / [] Appointment(In office/virtual)/ []  [facility]/ [x] Home Care/ [] Refused Recommended Disposition /[] [facility]/ []  Follow-up with PCP Additional Notes: would like to know if she should increase insulin  while on prednisone .  PCP to call patient back; care advice given, denies questions; instructed to go to ER if becomes worse.   Reason for Disposition  [1] Blood glucose 240 - 300 mg/dL (13.3 - 16.7 mmol/L) AND [2] does not  use insulin  (e.g., not insulin -dependent; most people with type 2 diabetes)  Answer Assessment - Initial Assessment Questions 1. BLOOD GLUCOSE: "What is your blood glucose level?"      235 today; started prednisone  two days ago 2. ONSET: "When did you check the blood glucose?"     Twice daily 3. USUAL RANGE: "What is your glucose level usually?" (e.g., usual fasting morning value, usual evening value)     110-116 4. KETONES: "Do you check for ketones (urine or blood test strips)?" If Yes, ask: "What does the test show now?"      na 5. TYPE 1 or 2:  "Do you know what type of diabetes you have?"  (e.g., Type 1, Type 2, Gestational; doesn't know)      Type 2 6. INSULIN : "Do you take insulin ?" "What type of insulin (s) do you use? What is the mode  of delivery? (syringe, pen; injection or pump)?"      Yes, humalog  75/25 7. DIABETES PILLS: "Do you take any pills for your diabetes?" If Yes, ask: "Have you missed taking any pills recently?"     no 8. OTHER SYMPTOMS: "Do you have any symptoms?" (e.g., fever, frequent urination, difficulty breathing, dizziness, weakness, vomiting)     Light headed 9. PREGNANCY: "Is there any chance you are pregnant?" "When was your last menstrual period?"     na  Protocols used: Diabetes - High Blood Sugar-A-AH

## 2024-01-13 ENCOUNTER — Encounter: Payer: Self-pay | Admitting: Family Medicine

## 2024-01-13 ENCOUNTER — Ambulatory Visit: Admitting: Family Medicine

## 2024-01-13 VITALS — BP 126/80 | HR 94 | Resp 16 | Ht 70.0 in | Wt 351.2 lb

## 2024-01-13 DIAGNOSIS — N1831 Chronic kidney disease, stage 3a: Secondary | ICD-10-CM | POA: Diagnosis not present

## 2024-01-13 DIAGNOSIS — R058 Other specified cough: Secondary | ICD-10-CM

## 2024-01-13 DIAGNOSIS — J189 Pneumonia, unspecified organism: Secondary | ICD-10-CM

## 2024-01-13 DIAGNOSIS — J4540 Moderate persistent asthma, uncomplicated: Secondary | ICD-10-CM

## 2024-01-13 MED ORDER — TRUE METRIX AIR GLUCOSE METER W/DEVICE KIT: 1.0000 | PACK | Freq: Four times a day (QID) | 0 refills | Status: AC | PRN

## 2024-01-13 MED ORDER — AIRSUPRA 90-80 MCG/ACT IN AERO: 2.0000 | INHALATION_SPRAY | Freq: Four times a day (QID) | RESPIRATORY_TRACT | 1 refills | Status: AC | PRN

## 2024-01-13 MED ORDER — BENZONATATE 100 MG PO CAPS: 100.0000 mg | ORAL_CAPSULE | Freq: Three times a day (TID) | ORAL | 0 refills | Status: DC

## 2024-01-13 NOTE — Progress Notes (Signed)
 Name: Amber Reyes   MRN: [id number]    DOB: 04/27/1946   Date:01/13/2024       Progress Note  Subjective  Chief Complaint  Chief Complaint  Patient presents with   Cough    Cough and coughing up clear phlegm, was dx at [facility] w pneumonia    Discussed the use of AI scribe software for clinical note transcription with the patient, who gave verbal consent to proceed.  History of Present Illness Amber Reyes is a 78 year old female with asthma who presents with a persistent cough.  She has been experiencing a persistent cough since her episode of pneumonia in early May. The cough is mostly clear and occurs primarily during the day. She has completed courses of Augmentin  and doxycycline , and has been using Tessalon  Pearls for symptomatic relief. Despite these treatments, she continues to experience coughing, which she describes as being triggered by a 'rattling' sensation in her chest.  In early May, she was diagnosed with right lower lobe pneumonia after a rapid onset of symptoms over two days. A chest x-ray was performed at the time of diagnosis, and a follow-up x-ray is scheduled for early June. During her initial workup, she was found to be dehydrated with impaired kidney function, which has since improved. She was treated with prednisone  to manage inflammation associated with her asthma, which was exacerbated by the pneumonia.  She uses albuterol  and Advair for asthma management. She experiences wheezing, particularly when walking, and uses her rescue inhaler to alleviate these symptoms. The wheezing contributes to her cough. She has also been taking Mucinex to help clear phlegm and has been drinking plenty of water to aid this process.  Her kidney function was monitored by her nephrologist, who noted improvement and started her on  Chlorthalidone at a dose of 12.5 mg. She is to have her kidney function re-evaluated after starting this medication. She feels much better overall, though  she is still dealing with the residual cough and occasional wheezing.  No shortness of breath at rest but wheezing with exertion. Her cough is clear and not associated with any colored sputum. No current fever or other systemic symptoms.    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   Localized, 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/22/2021   Pain in joint of left shoulder 04/22/2021   Tendonitis of shoulder, left 04/22/2021   Hypertension associated with stage 3 chronic kidney disease due to type 2 diabetes mellitus (HCC) 02/06/2020   History of colonic polyps    Polyp of colon    Thoracic aortic atherosclerosis (HCC) 12/30/2016   Controlled type 2 diabetes mellitus with microalbuminuria, with long-term current use of insulin  (HCC) 12/23/2016   Venous insufficiency 05/21/2016   Hypertensive heart disease 05/21/2016   Anemia in chronic kidney disease 12/22/2015   Pulmonary hypertension (HCC) 12/18/2015   Xanthelasma 04/15/2015   Anxiety 04/12/2015   Chronic kidney disease (CKD), stage III (moderate) (HCC) 04/12/2015   Edema of extremities 04/12/2015   Disc disorder of lumbar region 04/12/2015   Asthma, moderate persistent 04/12/2015   Morbid obesity (HCC) 04/12/2015   Obstructive apnea 04/12/2015   Restless leg 04/12/2015   Allergic rhinitis 04/12/2015   Vitamin D  deficiency 04/12/2015   Controlled  gout 04/12/2015   Premature atrial contractions 11/29/2013   Atrial fibrillation (HCC) 11/09/2013   Benign essential hypertension    Hyperlipidemia     Social History   Tobacco Use   Smoking status: Never    Passive exposure: Never   Smokeless tobacco: Never   Tobacco comments:    n/a  Substance Use Topics   Alcohol use: No     Alcohol/week: 0.0 standard drinks of alcohol     Current Outpatient Medications:    acetaminophen  (TYLENOL ) 650 MG CR tablet, Take 650 mg by mouth in the morning and at bedtime., Disp: , Rfl:    Albuterol -Budesonide  (AIRSUPRA) 90-80 MCG/ACT AERO, Inhale 2 puffs into the lungs 4 (four) times daily as needed., Disp: 10.7 g, Rfl: 1   Alcohol Swabs (DROPSAFE ALCOHOL PREP) 70 % PADS, USE AS DIRECTED, Disp: 300 each, Rfl: 3   allopurinol  (ZYLOPRIM ) 100 MG tablet, TAKE 1 TABLET TWICE DAILY, Disp: 180 tablet, Rfl: 3   apixaban  (ELIQUIS ) 5 MG TABS tablet, Take 1 tablet (5 mg total) by mouth 2 (two) times daily., Disp: 60 tablet, Rfl: 5   Blood Glucose Calibration (TRUE METRIX LEVEL 1) Low SOLN, 1 each by In Vitro route once a week., Disp: 1 each, Rfl: 1   chlorthalidone (HYGROTON) 25 MG tablet, Take 12.5 mg by mouth daily., Disp: , Rfl:    Cholecalciferol  (VITAMIN D ) 2000 UNITS tablet, Take 2,000 Units by mouth daily., Disp: , Rfl:    colchicine  (COLCRYS ) 0.6 MG tablet, Take 1-2 tablets (0.6-1.2 mg total) by mouth daily as needed., Disp: 30 tablet, Rfl: 0   diltiazem  (CARDIZEM  CD) 240 MG 24 hr capsule, TAKE 1 CAPSULE EVERY DAY, Disp: 90 capsule, Rfl: 3   DROPLET PEN NEEDLES 30G X 8 MM MISC, USE TWICE DAILY WITH INSULIN , Disp: 200 each, Rfl: 3   Elastic Bandages & Supports (MEDICAL COMPRESSION STOCKINGS) MISC, 2 each by Does not apply route daily., Disp: 2 each, Rfl: 2   Ferrous Sulfate (IRON  SUPPLEMENT PO), Take 1 tablet by mouth daily., Disp: , Rfl:    fluticasone  (FLONASE ) 50 MCG/ACT nasal spray, USE 2 SPRAYS IN EACH NOSTRIL EVERY DAY, Disp: 48 g, Rfl: 3   fluticasone -salmeterol (WIXELA INHUB) 250-50 MCG/ACT AEPB, Inhale 1 puff into the lungs in the morning and at bedtime., Disp: 180 each, Rfl: 1   furosemide  (LASIX ) 20 MG tablet, Take 1 tablet (20 mg total) by mouth 2 (two) times daily., Disp: 180 tablet, Rfl: 1   HUMALOG  MIX 75/25 KWIKPEN (75-25) 100 UNIT/ML KwikPen, DIAL AND INJECT 10 UNITS  UNDER THE SKIN TWICE DAILY WITH A MEAL. MAX DAILY DOSE 20 UNITS. DISCARD USED PEN AFTER 10 DAYS., Disp: 45 mL, Rfl: 0   irbesartan  (AVAPRO ) 300 MG tablet, TAKE 1 TABLET EVERY DAY, Disp: 90 tablet, Rfl: 0   loratadine  (CLARITIN ) 10 MG tablet, TAKE 1 TABLET EVERY DAY, Disp: 90 tablet, Rfl: 3   Magnesium Chloride 64 MG TBEC, Take 1 tablet by mouth daily., Disp: , Rfl:    pravastatin  (PRAVACHOL ) 80 MG tablet, Take 1 tablet (80 mg total) by mouth every evening., Disp: 90 tablet, Rfl: 1   TRUE METRIX BLOOD GLUCOSE TEST test strip, CHECK BLOOD SUGAR FOUR TIMES DAILY, Disp: 400 strip, Rfl: 3   TRUEplus Lancets 33G MISC, TEST BLOOD SUGAR THREE TIMES DAILY AS NEEDED, Disp: 300 each, Rfl: 3   TRULICITY  3 MG/0.5ML SOAJ, INJECT 3 MG (0.5 ML) UNDER THE SKIN ONCE A WEEK, Disp: 8 mL, Rfl: 0  benzonatate  (TESSALON ) 100 MG capsule, Take 1 capsule (100 mg total) by mouth every 8 (eight) hours., Disp: 40 capsule, Rfl: 0   Blood Glucose Monitoring Suppl (TRUE METRIX AIR GLUCOSE METER) w/Device KIT, Inject 1 each into the skin 4 (four) times daily as needed. Check fsbs 4 times daily E11.22, Disp: 1 kit, Rfl: 0  Allergies  Allergen Reactions   Farxiga  [Dapagliflozin ] Hives    ROS  Ten systems reviewed and is negative except as mentioned in HPI    Objective  Vitals:   01/13/24 1016 01/13/24 1051  BP: (!) 144/76 126/80  Pulse: 94   Resp: 16   SpO2: 94%   Weight: (!) 351 lb 3.2 oz (159.3 kg)   Height: 5\' 10"  (1.778 m)     Body mass index is 50.39 kg/m.   Physical Exam CONSTITUTIONAL: Patient appears well-developed and well-nourished. No distress. HEENT: Head atraumatic, normocephalic, neck supple. CARDIOVASCULAR: Normal rate, regular rhythm and normal heart sounds. No murmur heard. No BLE edema. PULMONARY: Effort normal. Breath sounds scattered rhonchi.  No respiratory distress. ABDOMINAL: There is no tenderness or distention. PSYCHIATRIC: Patient has a normal mood and affect. Behavior is normal.  Judgment and thought content normal.  Recent Results (from the past 2160 hours)  POCT glycosylated hemoglobin (Hb A1C)     Status: Abnormal   Collection Time: 10/25/23  8:03 AM  Result Value Ref Range   Hemoglobin A1C 7.1 (A) 4.0 - 5.6 %   HbA1c POC (<> result, manual entry)     HbA1c, POC (prediabetic range)     HbA1c, POC (controlled diabetic range)    Parathyroid  hormone, intact (no Ca)     Status: Abnormal   Collection Time: 10/25/23  8:50 AM  Result Value Ref Range   PTH 80 (H) 16 - 77 pg/mL    Comment: . Interpretive Guide    Intact PTH           Calcium  ------------------    ----------           ------- Normal Parathyroid     Normal               Normal Hypoparathyroidism    Low or Low Normal    Low Hyperparathyroidism    Primary            Normal or High       High    Secondary          High                 Normal or Low    Tertiary           High                 High Non-Parathyroid     Hypercalcemia      Low or Low Normal    High .   Lipid panel     Status: None   Collection Time: 10/25/23  8:50 AM  Result Value Ref Range   Cholesterol 112 <200 mg/dL   HDL 52 > OR = 50 mg/dL   Triglycerides 67 <119 mg/dL   LDL Cholesterol (Calc) 46 mg/dL (calc)    Comment: Reference range: <100 . Desirable range <100 mg/dL for primary prevention;   <70 mg/dL for patients with CHD or diabetic patients  with > or = 2 CHD risk factors. Aaron Aas LDL-C is now calculated using the Martin-Hopkins  calculation, which is a validated novel method providing  better accuracy  than the Friedewald equation in the  estimation of LDL-C.  Melinda Sprawls et al. Erroll Heard. 9147;829(56): 2061-2068  (http://education.[facility].com/faq/FAQ164)    Total CHOL/HDL Ratio 2.2 <5.0 (calc)   Non-HDL Cholesterol (Calc) 60 <213 mg/dL (calc)    Comment: For patients with diabetes plus 1 major ASCVD risk  factor, treating to a non-HDL-C goal of <100 mg/dL  (LDL-C of <08 mg/dL) is considered a therapeutic  option.    COMPLETE METABOLIC PANEL WITH GFR     Status: Abnormal   Collection Time: 10/25/23  8:50 AM  Result Value Ref Range   Glucose, Bld 146 (H) 65 - 99 mg/dL    Comment: .            Fasting reference interval . For someone without known diabetes, a glucose value >125 mg/dL indicates that they may have diabetes and this should be confirmed with a follow-up test. .    BUN 29 (H) 7 - 25 mg/dL   Creat 6.57 (H) 8.46 - 1.00 mg/dL   eGFR 45 (L) > OR = 60 mL/min/1.3m2   BUN/Creatinine Ratio 23 (H) 6 - 22 (calc)   Sodium 140 135 - 146 mmol/L   Potassium 4.0 3.5 - 5.3 mmol/L   Chloride 106 98 - 110 mmol/L   CO2 23 20 - 32 mmol/L   Calcium  8.8 8.6 - 10.4 mg/dL   Total Protein 6.7 6.1 - 8.1 g/dL   Albumin 3.8 3.6 - 5.1 g/dL   Globulin 2.9 1.9 - 3.7 g/dL (calc)   AG Ratio 1.3 1.0 - 2.5 (calc)   Total Bilirubin 0.5 0.2 - 1.2 mg/dL   Alkaline phosphatase (APISO) 81 37 - 153 U/L   AST 14 10 - 35 U/L   ALT 6 6 - 29 U/L  VITAMIN D  25 Hydroxy (Vit-D Deficiency, Fractures)     Status: None   Collection Time: 10/25/23  8:50 AM  Result Value Ref Range   Vit D, 25-Hydroxy 54 30 - 100 ng/mL    Comment: Vitamin D  Status         25-OH Vitamin D : . Deficiency:                    <20 ng/mL Insufficiency:             20 - 29 ng/mL Optimal:                 > or = 30 ng/mL . For 25-OH Vitamin D  testing on patients on  D2-supplementation and patients for whom quantitation  of D2 and D3 fractions is required, the [facility]ure[facility](TM) 25-OH VIT D, (D2,D3), LC/MS/MS is recommended: order  code 96295 (patients >36yrs). . See Note 1 . Note 1 . For additional information, please refer to  http://education.[facility].com/faq/FAQ199  (This link is being provided for informational/ educational purposes only.)   Surgical pathology     Status: None   Collection Time: 11/01/23 12:00 AM  Result Value Ref Range   SURGICAL PATHOLOGY      SURGICAL PATHOLOGY [facility] [address] [place], [place] [address] Telephone [phone] or [phone] Fax [phone]  REPORT OF SURGICAL PATHOLOGY   Accession #: [id number] Patient Name: JOHARA, LODWICK Visit # : [id number]  MRN: [id number] Physician: Flynn Hylan DOB/Age February 13, 1946 (Age: 73) Gender: F Collected Date: 11/01/2023 Received Date: 11/01/2023  FINAL DIAGNOSIS       1. Cyst, excision, Sebaceous cyst on neck :  EPIDERMAL INCLUSION CYST.      NEGATIVE FOR MALIGNANCY.       DATE SIGNED OUT: 11/02/2023 ELECTRONIC SIGNATURE : Dillard Frame Md, Pathologist, Electronic Signature  MICROSCOPIC DESCRIPTION  CASE COMMENTS STAINS USED IN DIAGNOSIS: H&E    CLINICAL HISTORY  SPECIMEN(S) OBTAINED 1. Cyst, excision, Sebaceous Cyst On Neck  SPECIMEN COMMENTS: SPECIMEN CLINICAL INFORMATION: 1. Sebaceous cyst    Gross Description 1. Received in formalin is a 3.0 x 1.9 x 1.5 cm intact cystic str ucture with a minimal amount of attached tan, wrinkled skin.  Sectioning reveals an ample amount of tan-brown contents and a smooth inner wall that has a thickness of 0.1 cm.  No further distinct lesions/excrescences are grossly identified.  A representative cross section is submitted in one block.   (KW:kh 11/02/23)        Report signed out from the following location(s) [place] [address] [place], [place] [address] CLIA #: [id number]  [facility] [address] [place], [place] [address] CLIA #: [id number]   Retic Panel     Status: Abnormal   Collection Time: 12/19/23  8:23 AM  Result Value Ref Range   Retic Ct Pct 1.7 0.4 - 3.1 %   RBC. 3.36 (L) 3.87 - 5.11 MIL/uL   Retic Count, Absolute 55.4 19.0 - 186.0 K/uL   Immature Retic Fract 19.2 (H) 2.3 - 15.9 %   Reticulocyte Hemoglobin 30.7 >27.9 pg    Comment: Performed at [facility], [address] [place], [place] [address]  Ferritin     Status: None   Collection  Time: 12/19/23  8:23 AM  Result Value Ref Range   Ferritin 127 11 - 307 ng/mL    Comment: Performed at [facility], [address] [place], [place] [address]  Iron  and TIBC     Status: None   Collection Time: 12/19/23  8:23 AM  Result Value Ref Range   Iron  50 28 - 170 ug/dL   TIBC 563 875 - 643 ug/dL   Saturation Ratios 17 10.4 - 31.8 %   UIBC 241 ug/dL    Comment: Performed at [facility], [address] [place], [place] [address]  Multiple Myeloma Panel (SPEP&IFE w/QIG)     Status: None   Collection Time: 12/19/23  8:23 AM  Result Value Ref Range   IgG (Immunoglobin G), Serum 1,425 586 - 1,602 mg/dL   IgA 884 64 - 166 mg/dL   IgM (Immunoglobulin M), Srm 183 26 - 217 mg/dL   Total Protein ELP 6.9 6.0 - 8.5 g/dL   Albumin SerPl Elph-Mcnc 3.4 2.9 - 4.4 g/dL   Alpha 1 0.3 0.0 - 0.4 g/dL   Alpha2 Glob SerPl Elph-Mcnc 0.8 0.4 - 1.0 g/dL   B-Globulin SerPl Elph-Mcnc 1.1 0.7 - 1.3 g/dL   Gamma Glob SerPl Elph-Mcnc 1.4 0.4 - 1.8 g/dL   M Protein SerPl Elph-Mcnc Not Observed Not Observed g/dL   Globulin, Total 3.5 2.2 - 3.9 g/dL   Albumin/Glob SerPl 1.0 0.7 - 1.7   IFE 1 Comment     Comment: (NOTE) The immunofixation pattern appears unremarkable. Evidence of monoclonal protein is not apparent.    Please Note Comment     Comment: (NOTE) Protein electrophoresis scan will follow via computer, mail, or courier delivery. Performed At: [facility] [address] [place], [place] [id number] Pearlean Botts MD XN:[id number]   Kappa/lambda light chains     Status: Abnormal  Collection Time: 12/19/23  8:23 AM  Result Value Ref Range   Kappa free light chain 59.7 (H) 3.3 - 19.4 mg/L   Lambda free light chains 42.5 (H) 5.7 - 26.3 mg/L   Kappa, lambda light chain ratio 1.40 0.26 - 1.65    Comment: (NOTE) Performed At: [facility] [address] [place], [place] [id number] Pearlean Botts MD WU:[id number]   CBC with Differential (Cancer Center  Only)     Status: Abnormal   Collection Time: 12/19/23  8:23 AM  Result Value Ref Range   WBC Count 7.4 4.0 - 10.5 K/uL   RBC 3.36 (L) 3.87 - 5.11 MIL/uL   Hemoglobin 9.5 (L) 12.0 - 15.0 g/dL   HCT 95.6 (L) 21.3 - 08.6 %   MCV 89.6 80.0 - 100.0 fL   MCH 28.3 26.0 - 34.0 pg   MCHC 31.6 30.0 - 36.0 g/dL   RDW 57.8 (H) 46.9 - 62.9 %   Platelet Count 191 150 - 400 K/uL   nRBC 0.0 0.0 - 0.2 %   Neutrophils Relative % 69 %   Neutro Abs 5.1 1.7 - 7.7 K/uL   Lymphocytes Relative 17 %   Lymphs Abs 1.3 0.7 - 4.0 K/uL   Monocytes Relative 8 %   Monocytes Absolute 0.6 0.1 - 1.0 K/uL   Eosinophils Relative 4 %   Eosinophils Absolute 0.3 0.0 - 0.5 K/uL   Basophils Relative 1 %   Basophils Absolute 0.1 0.0 - 0.1 K/uL   Immature Granulocytes 1 %   Abs Immature Granulocytes 0.09 (H) 0.00 - 0.07 K/uL    Comment: Performed at [facility], [address] [place], [place] [address]  POC Covid19/Flu A&B Antigen     Status: Normal   Collection Time: 12/25/23 11:12 AM  Result Value Ref Range   Influenza A Antigen, POC Negative    Influenza B Antigen, POC Negative    Covid Antigen, POC Negative   CBC with Differential/Platelet     Status: Abnormal   Collection Time: 12/25/23 11:53 AM  Result Value Ref Range   WBC 8.3 3.4 - 10.8 x10E3/uL   RBC 3.53 (L) 3.77 - 5.28 x10E6/uL   Hemoglobin 9.6 (L) 11.1 - 15.9 g/dL   Hematocrit 32.4 (L) 40.1 - 46.6 %   MCV 88 79 - 97 fL   MCH 27.2 26.6 - 33.0 pg   MCHC 30.8 (L) 31.5 - 35.7 g/dL   RDW 02.7 25.3 - 66.4 %   Platelets 192 150 - 450 x10E3/uL   Neutrophils 82 Not Estab. %   Lymphs 8 Not Estab. %   Monocytes 8 Not Estab. %   Eos 1 Not Estab. %   Basos 1 Not Estab. %   Neutrophils Absolute 6.8 1.4 - 7.0 x10E3/uL   Lymphocytes Absolute 0.6 (L) 0.7 - 3.1 x10E3/uL   Monocytes Absolute 0.7 0.1 - 0.9 x10E3/uL   EOS (ABSOLUTE) 0.1 0.0 - 0.4 x10E3/uL   Basophils Absolute 0.1 0.0 - 0.2 x10E3/uL   Immature Granulocytes 0 Not Estab. %   Immature Grans  (Abs) 0.0 0.0 - 0.1 x10E3/uL  Basic metabolic panel     Status: Abnormal   Collection Time: 12/25/23 11:53 AM  Result Value Ref Range   Glucose 152 (H) 70 - 99 mg/dL   BUN 32 (H) 8 - 27 mg/dL   Creatinine, Ser 4.03 (H) 0.57 - 1.00 mg/dL   eGFR 34 (L) >47 QQ/VZD/6.38   BUN/Creatinine Ratio 20 12 - 28  Sodium 132 (L) 134 - 144 mmol/L   Potassium 5.5 (H) 3.5 - 5.2 mmol/L   Chloride 98 96 - 106 mmol/L   CO2 19 (L) 20 - 29 mmol/L   Calcium  9.1 8.7 - 10.3 mg/dL      Assessment & Plan Post bronchial cough Persistent cough due to bronchial irritation post-infection, improving with current treatment. - Prescribe Tessalon  Perles for cough management. - Continue Mucinex for sputum expectoration. - Prescribe Airsupra inhaler to replace albuterol  for wheezing and secretion management. - Provide Airsupra voucher; revert to albuterol  if cost is prohibitive. - Consider hydrocodone-containing cough syrup for daytime use if needed.  Asthma Asthma exacerbated by recent pneumonia, improving with current treatment. - Continue Advair for asthma management. - Replace albuterol  with Airsupra inhaler for improved wheezing and secretion management.  CAP Recent right lower lobe pneumonia treated with antibiotics, significant symptom improvement noted. - Schedule follow-up chest x-ray in the first week of June to assess resolution.  Chronic kidney disease stage IIIa Renal function improved and stable, recently seen by nephrologist and started on Chlorthalidone  - Monitor renal function as per nephrologist's recommendations.

## 2024-01-14 ENCOUNTER — Other Ambulatory Visit: Payer: Self-pay | Admitting: Family Medicine

## 2024-01-20 ENCOUNTER — Inpatient Hospital Stay

## 2024-01-20 ENCOUNTER — Inpatient Hospital Stay: Attending: Oncology | Admitting: Oncology

## 2024-01-20 ENCOUNTER — Other Ambulatory Visit: Payer: Self-pay

## 2024-01-20 ENCOUNTER — Encounter: Payer: Self-pay | Admitting: Oncology

## 2024-01-20 VITALS — BP 128/49 | HR 73 | Temp 97.9°F | Resp 18 | Wt 353.8 lb

## 2024-01-20 DIAGNOSIS — N1831 Chronic kidney disease, stage 3a: Secondary | ICD-10-CM

## 2024-01-20 DIAGNOSIS — M109 Gout, unspecified: Secondary | ICD-10-CM | POA: Insufficient documentation

## 2024-01-20 DIAGNOSIS — E1122 Type 2 diabetes mellitus with diabetic chronic kidney disease: Secondary | ICD-10-CM | POA: Insufficient documentation

## 2024-01-20 DIAGNOSIS — D631 Anemia in chronic kidney disease: Secondary | ICD-10-CM | POA: Insufficient documentation

## 2024-01-20 DIAGNOSIS — G4730 Sleep apnea, unspecified: Secondary | ICD-10-CM | POA: Diagnosis not present

## 2024-01-20 DIAGNOSIS — Z8249 Family history of ischemic heart disease and other diseases of the circulatory system: Secondary | ICD-10-CM | POA: Diagnosis not present

## 2024-01-20 DIAGNOSIS — Z79899 Other long term (current) drug therapy: Secondary | ICD-10-CM | POA: Diagnosis not present

## 2024-01-20 DIAGNOSIS — I4891 Unspecified atrial fibrillation: Secondary | ICD-10-CM | POA: Diagnosis not present

## 2024-01-20 DIAGNOSIS — E785 Hyperlipidemia, unspecified: Secondary | ICD-10-CM | POA: Diagnosis not present

## 2024-01-20 DIAGNOSIS — I129 Hypertensive chronic kidney disease with stage 1 through stage 4 chronic kidney disease, or unspecified chronic kidney disease: Secondary | ICD-10-CM | POA: Insufficient documentation

## 2024-01-20 DIAGNOSIS — Z7901 Long term (current) use of anticoagulants: Secondary | ICD-10-CM | POA: Diagnosis not present

## 2024-01-20 DIAGNOSIS — Z8052 Family history of malignant neoplasm of bladder: Secondary | ICD-10-CM | POA: Insufficient documentation

## 2024-01-20 DIAGNOSIS — I872 Venous insufficiency (chronic) (peripheral): Secondary | ICD-10-CM | POA: Diagnosis not present

## 2024-01-20 LAB — CBC WITH DIFFERENTIAL (CANCER CENTER ONLY)
Abs Immature Granulocytes: 0.03 10*3/uL (ref 0.00–0.07)
Basophils Absolute: 0.1 10*3/uL (ref 0.0–0.1)
Basophils Relative: 1 %
Eosinophils Absolute: 0.5 10*3/uL (ref 0.0–0.5)
Eosinophils Relative: 6 %
HCT: 30.1 % — ABNORMAL LOW (ref 36.0–46.0)
Hemoglobin: 9.3 g/dL — ABNORMAL LOW (ref 12.0–15.0)
Immature Granulocytes: 0 %
Lymphocytes Relative: 15 %
Lymphs Abs: 1.2 10*3/uL (ref 0.7–4.0)
MCH: 27.6 pg (ref 26.0–34.0)
MCHC: 30.9 g/dL (ref 30.0–36.0)
MCV: 89.3 fL (ref 80.0–100.0)
Monocytes Absolute: 0.6 10*3/uL (ref 0.1–1.0)
Monocytes Relative: 7 %
Neutro Abs: 5.8 10*3/uL (ref 1.7–7.7)
Neutrophils Relative %: 71 %
Platelet Count: 206 10*3/uL (ref 150–400)
RBC: 3.37 MIL/uL — ABNORMAL LOW (ref 3.87–5.11)
RDW: 15.9 % — ABNORMAL HIGH (ref 11.5–15.5)
WBC Count: 8.1 10*3/uL (ref 4.0–10.5)
nRBC: 0 % (ref 0.0–0.2)

## 2024-01-20 MED ORDER — EPOETIN ALFA-EPBX 20000 UNIT/ML IJ SOLN
20000.0000 [IU] | Freq: Once | INTRAMUSCULAR | Status: AC
  Administered 2024-01-20: 20000 [IU] via SUBCUTANEOUS
  Filled 2024-01-20: qty 1

## 2024-01-20 NOTE — Progress Notes (Signed)
 PT here for follow up. Pt reports that she had pneumonia 2 weeks ago

## 2024-01-20 NOTE — Assessment & Plan Note (Addendum)
 Labs are reviewed and discussed with patient. Anemia is likely due to chronic kidney disease. Lab Results  Component Value Date   HGB 9.6 (L) 12/25/2023   TIBC 291 12/19/2023   IRONPCTSAT 17 12/19/2023   FERRITIN 127 12/19/2023     Recommend erythropoietin replacement therapy.  Rationale potential side effects were reviewed and discussed with patient. Recommend to proceed with Retacrit 20,000 units today. I will also arrange patient to get Venofer  200 mg x 1 next week. Monitor H&H every 4 weeks +/- Retacrit if hemoglobin less than 10.

## 2024-01-20 NOTE — Progress Notes (Signed)
 Hematology/Oncology Progress note [phone] [phone]           REFERRING PROVIDER: Sowles, Krichna, MD   CHIEF COMPLAINTS/REASON FOR VISIT:   anemia in chronic kidney disease.   ASSESSMENT & PLAN:   Anemia due to stage 3a chronic kidney disease (HCC) Labs are reviewed and discussed with patient. Anemia is likely due to chronic kidney disease. Lab Results  Component Value Date   HGB 9.6 (L) 12/25/2023   TIBC 291 12/19/2023   IRONPCTSAT 17 12/19/2023   FERRITIN 127 12/19/2023     Recommend erythropoietin replacement therapy.  Rationale potential side effects were reviewed and discussed with patient. Recommend to proceed with Retacrit  20,000 units today. I will also arrange patient to get Venofer  200 mg x 1 next week. Monitor H&H every 4 weeks +/- Retacrit  if hemoglobin less than 10.   Orders Placed This Encounter  Procedures   CBC with Differential (Cancer Center Only)    Standing Status:   Future    Expected Date:   04/21/2024    Expiration Date:   01/19/2025   Iron  and TIBC    Standing Status:   Future    Expected Date:   04/21/2024    Expiration Date:   01/19/2025   Ferritin    Standing Status:   Future    Expected Date:   04/21/2024    Expiration Date:   01/19/2025   Retic Panel    Standing Status:   Future    Expected Date:   04/21/2024    Expiration Date:   01/19/2025   Hemoglobin and Hematocrit (Cancer Center Only)    Standing Status:   Standing    Number of Occurrences:   2    Expiration Date:   01/19/2025   Follow-up per LOS. All questions were answered. The patient knows to call the clinic with any problems, questions or concerns.  Amber Forbes, MD, PhD [facility] Hematology Oncology 01/20/2024   HISTORY OF PRESENTING ILLNESS:   Amber Reyes is a  78 y.o.  female with PMH listed below was seen in consultation at the request of  Sowles, Krichna, MD  for evaluation of anemia and chronic kidney disease.  Patient has a chronic  anemia history.  Baseline hemoglobin is above 10. Recent blood work showed hemoglobin has dropped below 10 and has been in the mid 9s. Patient reports chronic fatigue.  She has chronic kidney disease stage IIIa. Denies hematochezia, hematuria, hematemesis, epistaxis, black tarry stool or easy bruising.  Last colonoscopy was in December 2024. Patient takes Eliquis  for atrial fibrillation. INTERVAL HISTORY Amber Reyes is a 78 y.o. female who has above history reviewed by me today presents for follow up visit for anemia due to chronic kidney disease .  Patient received IV Venofer  treatments.  She tolerated well.  She reports feeling well currently.  Recent pneumonia infection was treated. MEDICAL HISTORY:  Past Medical History:  Diagnosis Date   Allergic rhinitis, cause unspecified    Anxiety    Chronic diastolic CHF (congestive heart failure) (HCC)    CKD (chronic kidney disease), stage III (HCC)    Edema    Essential hypertension, benign    Gout, unspecified    Heart murmur    as child   Hyperlipidemia    Lipoma of unspecified site    Other and unspecified disc disorder of lumbar region    Other general symptoms(780.99)    PAC (premature atrial contraction)    Renal insufficiency  Restless legs syndrome (RLS)    Sebaceous cyst    Super obesity    Type II or unspecified type diabetes mellitus without mention of complication, uncontrolled    Unspecified asthma(493.90)    Unspecified disease of pericardium    Unspecified sleep apnea    Unspecified vitamin D  deficiency    Vaginitis and vulvovaginitis, unspecified    Venous insufficiency     SURGICAL HISTORY: Past Surgical History:  Procedure Laterality Date   CARDIAC CATHETERIZATION  2002   [facility]   CATARACT EXTRACTION W/PHACO Right 09/15/2021   Procedure: CATARACT EXTRACTION PHACO AND INTRAOCULAR LENS PLACEMENT (IOC) RIGHT 10.19 01:01.8;  Surgeon: Clair Crews, MD;  Location: [facility];  Service:  Ophthalmology;  Laterality: Right;   CATARACT EXTRACTION W/PHACO Left 09/29/2021   Procedure: CATARACT EXTRACTION PHACO AND INTRAOCULAR LENS PLACEMENT (IOC) LEFT DIABETIC 11.40 01:21.2;  Surgeon: Clair Crews, MD;  Location: [facility];  Service: Ophthalmology;  Laterality: Left;   COLONOSCOPY WITH PROPOFOL  N/A 09/25/2018   Procedure: COLONOSCOPY WITH PROPOFOL ;  Surgeon: Luke Salaam, MD;  Location: [facility] ENDOSCOPY;  Service: Gastroenterology;  Laterality: N/A;   COLONOSCOPY WITH PROPOFOL  N/A 04/13/2019   Procedure: COLONOSCOPY WITH PROPOFOL ;  Surgeon: Luke Salaam, MD;  Location: [facility] ENDOSCOPY;  Service: Gastroenterology;  Laterality: N/A;   COLONOSCOPY WITH PROPOFOL  N/A 06/27/2020   Procedure: COLONOSCOPY WITH PROPOFOL ;  Surgeon: Luke Salaam, MD;  Location: [facility] ENDOSCOPY;  Service: Gastroenterology;  Laterality: N/A;   COLONOSCOPY WITH PROPOFOL  N/A 08/02/2023   Procedure: COLONOSCOPY WITH PROPOFOL ;  Surgeon: Luke Salaam, MD;  Location: [facility] ENDOSCOPY;  Service: Gastroenterology;  Laterality: N/A;   PERICARDIUM SURGERY     POLYPECTOMY  08/02/2023   Procedure: POLYPECTOMY;  Surgeon: Luke Salaam, MD;  Location: [facility] ENDOSCOPY;  Service: Gastroenterology;;    SOCIAL HISTORY: Social History   Socioeconomic History   Marital status: Married    Spouse name: Not on file   Number of children: 3   Years of education: Not on file   Highest education level: Associate degree: academic program  Occupational History   Occupation: retired  Tobacco Use   Smoking status: Never    Passive exposure: Never   Smokeless tobacco: Never   Tobacco comments:    n/a  Vaping Use   Vaping status: Never Used  Substance and Sexual Activity   Alcohol use: No    Alcohol/week: 0.0 standard drinks of alcohol   Drug use: No   Sexual activity: Not Currently  Other Topics Concern   Not on file  Social History Narrative   Not on file   Social Drivers of Health   Financial Resource Strain: Low Risk   (07/07/2023)   Overall Financial Resource Strain (CARDIA)    Difficulty of Paying Living Expenses: Not very hard  Food Insecurity: No Food Insecurity (07/07/2023)   Hunger Vital Sign    Worried About Running Out of Food in the Last Year: Never true    Ran Out of Food in the Last Year: Never true  Transportation Needs: No Transportation Needs (06/24/2022)   PRAPARE - Administrator, Civil Service (Medical): No    Lack of Transportation (Non-Medical): No  Physical Activity: Inactive (07/07/2023)   Exercise Vital Sign    Days of Exercise per Week: 0 days    Minutes of Exercise per Session: 0 min  Stress: No Stress Concern Present (07/07/2023)   [employer] of Occupational Health - Occupational Stress Questionnaire    Feeling of Stress : Not at  all  Social Connections: Moderately Isolated (07/07/2023)   Social Connection and Isolation Panel [NHANES]    Frequency of Communication with Friends and Family: More than three times a week    Frequency of Social Gatherings with Friends and Family: Twice a week    Attends Religious Services: Never    Database administrator or Organizations: No    Attends Banker Meetings: Never    Marital Status: Married  Catering manager Violence: Not At Risk (07/07/2023)   Humiliation, Afraid, Rape, and Kick questionnaire    Fear of Current or Ex-Partner: No    Emotionally Abused: No    Physically Abused: No    Sexually Abused: No    FAMILY HISTORY: Family History  Problem Relation Age of Onset   Heart attack Mother    Hypertension Mother    Bladder Cancer Father    Breast cancer Neg Hx     ALLERGIES:  is allergic to farxiga  [dapagliflozin ].  MEDICATIONS:  Current Outpatient Medications  Medication Sig Dispense Refill   acetaminophen  (TYLENOL ) 650 MG CR tablet Take 650 mg by mouth in the morning and at bedtime.     Albuterol -Budesonide  (AIRSUPRA) 90-80 MCG/ACT AERO Inhale 2 puffs into the lungs 4 (four) times daily  as needed. 10.7 g 1   Alcohol Swabs (DROPSAFE ALCOHOL PREP) 70 % PADS USE AS DIRECTED 300 each 3   allopurinol  (ZYLOPRIM ) 100 MG tablet TAKE 1 TABLET TWICE DAILY 180 tablet 3   apixaban  (ELIQUIS ) 5 MG TABS tablet Take 1 tablet (5 mg total) by mouth 2 (two) times daily. 60 tablet 5   benzonatate  (TESSALON ) 100 MG capsule Take 1 capsule (100 mg total) by mouth every 8 (eight) hours. 40 capsule 0   Blood Glucose Calibration (TRUE METRIX LEVEL 1) Low SOLN 1 each by In Vitro route once a week. 1 each 1   Blood Glucose Monitoring Suppl (TRUE METRIX AIR GLUCOSE METER) w/Device KIT Inject 1 each into the skin 4 (four) times daily as needed. Check fsbs 4 times daily E11.22 1 kit 0   chlorthalidone (HYGROTON) 25 MG tablet Take 12.5 mg by mouth daily.     Cholecalciferol  (VITAMIN D ) 2000 UNITS tablet Take 2,000 Units by mouth daily.     colchicine  (COLCRYS ) 0.6 MG tablet Take 1-2 tablets (0.6-1.2 mg total) by mouth daily as needed. 30 tablet 0   diltiazem  (CARDIZEM  CD) 240 MG 24 hr capsule TAKE 1 CAPSULE EVERY DAY 90 capsule 3   DROPLET PEN NEEDLES 30G X 8 MM MISC USE TWICE DAILY WITH INSULIN  200 each 3   Elastic Bandages & Supports (MEDICAL COMPRESSION STOCKINGS) MISC 2 each by Does not apply route daily. 2 each 2   Ferrous Sulfate (IRON  SUPPLEMENT PO) Take 1 tablet by mouth daily.     fluticasone  (FLONASE ) 50 MCG/ACT nasal spray USE 2 SPRAYS IN EACH NOSTRIL EVERY DAY 48 g 3   fluticasone -salmeterol (WIXELA INHUB) 250-50 MCG/ACT AEPB Inhale 1 puff into the lungs in the morning and at bedtime. 180 each 1   furosemide  (LASIX ) 20 MG tablet Take 1 tablet (20 mg total) by mouth 2 (two) times daily. 180 tablet 1   HUMALOG  MIX 75/25 KWIKPEN (75-25) 100 UNIT/ML KwikPen DIAL AND INJECT 10 UNITS UNDER THE SKIN TWICE DAILY WITH A MEAL. MAX DAILY DOSE 20 UNITS. DISCARD USED PEN AFTER 10 DAYS. 45 mL 0   irbesartan  (AVAPRO ) 300 MG tablet TAKE 1 TABLET EVERY DAY 90 tablet 0   loratadine  (CLARITIN ) 10  MG tablet TAKE 1  TABLET EVERY DAY 90 tablet 3   Magnesium Chloride 64 MG TBEC Take 1 tablet by mouth daily.     pravastatin  (PRAVACHOL ) 80 MG tablet Take 1 tablet (80 mg total) by mouth every evening. 90 tablet 1   TRUE METRIX BLOOD GLUCOSE TEST test strip CHECK BLOOD SUGAR FOUR TIMES DAILY 400 strip 3   TRUEplus Lancets 33G MISC TEST BLOOD SUGAR THREE TIMES DAILY AS NEEDED 300 each 3   TRULICITY  3 MG/0.5ML SOAJ INJECT 3 MG (0.5 ML) UNDER THE SKIN ONCE A WEEK 8 mL 0   No current facility-administered medications for this visit.    Review of Systems  Constitutional:  Positive for fatigue. Negative for appetite change, chills and fever.  HENT:   Negative for hearing loss and voice change.   Eyes:  Negative for eye problems.  Respiratory:  Negative for chest tightness and cough.   Cardiovascular:  Negative for chest pain.  Gastrointestinal:  Negative for abdominal distention, abdominal pain and blood in stool.  Endocrine: Negative for hot flashes.  Genitourinary:  Negative for difficulty urinating and frequency.   Musculoskeletal:  Negative for arthralgias.  Skin:  Negative for itching and rash.  Neurological:  Negative for extremity weakness.  Hematological:  Negative for adenopathy.  Psychiatric/Behavioral:  Negative for confusion.    PHYSICAL EXAMINATION: ECOG PERFORMANCE STATUS: 1 - Symptomatic but completely ambulatory Vitals:   01/20/24 1144  BP: (!) 128/49  Pulse: 73  Resp: 18  Temp: 97.9 F (36.6 C)  SpO2: 100%   Filed Weights   01/20/24 1144  Weight: (!) 353 lb 12.8 oz (160.5 kg)    Physical Exam Constitutional:      General: She is not in acute distress.    Appearance: She is obese.  HENT:     Head: Normocephalic and atraumatic.  Eyes:     General: No scleral icterus. Cardiovascular:     Rate and Rhythm: Normal rate. Rhythm irregular.     Heart sounds: Murmur heard.  Pulmonary:     Effort: Pulmonary effort is normal. No respiratory distress.     Breath sounds: No wheezing.   Abdominal:     General: Bowel sounds are normal. There is no distension.     Palpations: Abdomen is soft.  Musculoskeletal:        General: No deformity. Normal range of motion.     Cervical back: Normal range of motion and neck supple.  Skin:    General: Skin is warm and dry.     Findings: No erythema or rash.  Neurological:     Mental Status: She is alert and oriented to person, place, and time. Mental status is at baseline.  Psychiatric:        Mood and Affect: Mood normal.     LABORATORY DATA:  I have reviewed the data as listed    Latest Ref Rng & Units 01/20/2024   10:55 AM 12/25/2023   11:53 AM 12/19/2023    8:23 AM  CBC  WBC 4.0 - 10.5 K/uL 8.1  8.3  7.4   Hemoglobin 12.0 - 15.0 g/dL 9.3  9.6  9.5   Hematocrit 36.0 - 46.0 % 30.1  31.2  30.1   Platelets 150 - 400 K/uL 206  192  191       Latest Ref Rng & Units 12/25/2023   11:53 AM 10/25/2023    8:50 AM 08/30/2023   11:33 AM  CMP  Glucose 70 - 99  mg/dL 098  119  147   BUN 8 - 27 mg/dL 32  29  26   Creatinine 0.57 - 1.00 mg/dL 8.29  5.62  1.30   Sodium 134 - 144 mmol/L 132  140  135   Potassium 3.5 - 5.2 mmol/L 5.5  4.0  4.4   Chloride 96 - 106 mmol/L 98  106  102   CO2 20 - 29 mmol/L 19  23  25    Calcium  8.7 - 10.3 mg/dL 9.1  8.8  8.4   Total Protein 6.1 - 8.1 g/dL  6.7    Total Bilirubin 0.2 - 1.2 mg/dL  0.5    AST 10 - 35 U/L  14    ALT 6 - 29 U/L  6        RADIOGRAPHIC STUDIES: I have personally reviewed the radiological images as listed and agreed with the findings in the report. DG Chest 2 View Result Date: 12/25/2023 CLINICAL DATA:  Acute cough. EXAM: CHEST - 2 VIEW COMPARISON:  08/30/2023 FINDINGS: The cardio pericardial silhouette is enlarged. Interstitial markings are diffusely coarsened with chronic features. Vascular congestion noted with potential dependent interstitial edema the lung bases. There is new airspace disease in the right lower lung suspicious for pneumonia. Bones are diffusely  demineralized. IMPRESSION: 1. New airspace disease in the right lower lung suspicious for pneumonia. 2. Vascular congestion with potential dependent interstitial edema at the lung bases. Electronically Signed   By: Donnal Fusi M.D.   On: 12/25/2023 11:40

## 2024-01-23 ENCOUNTER — Inpatient Hospital Stay: Attending: Oncology

## 2024-01-23 VITALS — BP 141/57 | HR 72 | Temp 96.3°F | Resp 18

## 2024-01-23 DIAGNOSIS — Z8052 Family history of malignant neoplasm of bladder: Secondary | ICD-10-CM | POA: Diagnosis not present

## 2024-01-23 DIAGNOSIS — N1831 Chronic kidney disease, stage 3a: Secondary | ICD-10-CM | POA: Insufficient documentation

## 2024-01-23 DIAGNOSIS — Z8249 Family history of ischemic heart disease and other diseases of the circulatory system: Secondary | ICD-10-CM | POA: Insufficient documentation

## 2024-01-23 DIAGNOSIS — Z79899 Other long term (current) drug therapy: Secondary | ICD-10-CM | POA: Insufficient documentation

## 2024-01-23 DIAGNOSIS — D631 Anemia in chronic kidney disease: Secondary | ICD-10-CM | POA: Diagnosis present

## 2024-01-23 DIAGNOSIS — R5383 Other fatigue: Secondary | ICD-10-CM | POA: Insufficient documentation

## 2024-01-23 MED ORDER — IRON SUCROSE 20 MG/ML IV SOLN
200.0000 mg | Freq: Once | INTRAVENOUS | Status: AC
  Administered 2024-01-23: 200 mg via INTRAVENOUS

## 2024-01-23 NOTE — Patient Instructions (Signed)

## 2024-01-31 ENCOUNTER — Telehealth: Payer: Self-pay

## 2024-01-31 ENCOUNTER — Ambulatory Visit
Admission: RE | Admit: 2024-01-31 | Discharge: 2024-01-31 | Disposition: A | Discharge status: Home / Self Care | Source: Ambulatory Visit | Attending: Family Medicine | Admitting: Family Medicine

## 2024-01-31 ENCOUNTER — Ambulatory Visit
Admission: RE | Admit: 2024-01-31 | Discharge: 2024-01-31 | Disposition: A | Discharge status: Home / Self Care | Attending: Family Medicine | Admitting: Family Medicine

## 2024-01-31 DIAGNOSIS — Z1231 Encounter for screening mammogram for malignant neoplasm of breast: Secondary | ICD-10-CM | POA: Diagnosis not present

## 2024-01-31 DIAGNOSIS — J189 Pneumonia, unspecified organism: Secondary | ICD-10-CM | POA: Insufficient documentation

## 2024-01-31 NOTE — Telephone Encounter (Signed)
 Copied from CRM [id number]. Topic: Clinical - Lab/Test Results >> Jan 31, 2024  8:21 AM Oddis Bench wrote: Reason for CRM: Patient is calling to find out if it is time to get xray's for her chest she had one when she had pneumonia and the Dr wanted her to get another one 3 weeks after the first one did not see new orders. Please call the patient with information

## 2024-01-31 NOTE — Telephone Encounter (Signed)
 Advised patient per note:  CAP Recent right lower lobe pneumonia treated with antibiotics, significant symptom improvement noted. - Schedule follow-up chest x-ray in the first week of June to assess resolution.  Patient will call Laurice Pope to get it schedule.

## 2024-02-03 ENCOUNTER — Ambulatory Visit: Payer: Self-pay | Admitting: Family Medicine

## 2024-02-17 ENCOUNTER — Inpatient Hospital Stay

## 2024-02-17 VITALS — BP 138/65

## 2024-02-17 DIAGNOSIS — N1831 Chronic kidney disease, stage 3a: Secondary | ICD-10-CM

## 2024-02-17 LAB — HEMOGLOBIN AND HEMATOCRIT (CANCER CENTER ONLY)
HCT: 30.4 % — ABNORMAL LOW (ref 36.0–46.0)
Hemoglobin: 9.6 g/dL — ABNORMAL LOW (ref 12.0–15.0)

## 2024-02-17 MED ORDER — EPOETIN ALFA-EPBX 20000 UNIT/ML IJ SOLN
20000.0000 [IU] | Freq: Once | INTRAMUSCULAR | Status: AC
  Administered 2024-02-17: 20000 [IU] via SUBCUTANEOUS
  Filled 2024-02-17: qty 1

## 2024-02-27 ENCOUNTER — Other Ambulatory Visit: Payer: Self-pay | Admitting: Family Medicine

## 2024-02-27 DIAGNOSIS — J4540 Moderate persistent asthma, uncomplicated: Secondary | ICD-10-CM

## 2024-02-28 ENCOUNTER — Ambulatory Visit: Admitting: Family Medicine

## 2024-03-07 ENCOUNTER — Other Ambulatory Visit: Payer: Self-pay | Admitting: Family Medicine

## 2024-03-07 DIAGNOSIS — N183 Chronic kidney disease, stage 3 unspecified: Secondary | ICD-10-CM

## 2024-03-16 ENCOUNTER — Other Ambulatory Visit

## 2024-03-16 ENCOUNTER — Ambulatory Visit

## 2024-03-21 ENCOUNTER — Other Ambulatory Visit: Payer: Self-pay | Admitting: Family Medicine

## 2024-03-21 DIAGNOSIS — I10 Essential (primary) hypertension: Secondary | ICD-10-CM

## 2024-03-26 ENCOUNTER — Other Ambulatory Visit: Payer: Self-pay | Admitting: Family Medicine

## 2024-03-26 DIAGNOSIS — E1129 Type 2 diabetes mellitus with other diabetic kidney complication: Secondary | ICD-10-CM

## 2024-03-26 DIAGNOSIS — N183 Chronic kidney disease, stage 3 unspecified: Secondary | ICD-10-CM

## 2024-04-12 ENCOUNTER — Encounter: Payer: Self-pay | Admitting: Family Medicine

## 2024-04-12 ENCOUNTER — Ambulatory Visit: Admitting: Family Medicine

## 2024-04-12 VITALS — BP 126/68 | HR 69 | Temp 97.5°F | Resp 18 | Ht 70.0 in | Wt 345.0 lb

## 2024-04-12 DIAGNOSIS — N1831 Chronic kidney disease, stage 3a: Secondary | ICD-10-CM | POA: Diagnosis not present

## 2024-04-12 DIAGNOSIS — I110 Hypertensive heart disease with heart failure: Secondary | ICD-10-CM

## 2024-04-12 DIAGNOSIS — Z794 Long term (current) use of insulin: Secondary | ICD-10-CM

## 2024-04-12 DIAGNOSIS — E1122 Type 2 diabetes mellitus with diabetic chronic kidney disease: Secondary | ICD-10-CM

## 2024-04-12 DIAGNOSIS — E785 Hyperlipidemia, unspecified: Secondary | ICD-10-CM | POA: Diagnosis not present

## 2024-04-12 DIAGNOSIS — M109 Gout, unspecified: Secondary | ICD-10-CM

## 2024-04-12 DIAGNOSIS — I77819 Aortic ectasia, unspecified site: Secondary | ICD-10-CM | POA: Diagnosis not present

## 2024-04-12 DIAGNOSIS — G4733 Obstructive sleep apnea (adult) (pediatric): Secondary | ICD-10-CM

## 2024-04-12 DIAGNOSIS — N183 Chronic kidney disease, stage 3 unspecified: Secondary | ICD-10-CM

## 2024-04-12 DIAGNOSIS — E1169 Type 2 diabetes mellitus with other specified complication: Secondary | ICD-10-CM

## 2024-04-12 DIAGNOSIS — I482 Chronic atrial fibrillation, unspecified: Secondary | ICD-10-CM

## 2024-04-12 DIAGNOSIS — J4540 Moderate persistent asthma, uncomplicated: Secondary | ICD-10-CM

## 2024-04-12 DIAGNOSIS — K429 Umbilical hernia without obstruction or gangrene: Secondary | ICD-10-CM

## 2024-04-12 MED ORDER — PRAVASTATIN SODIUM 80 MG PO TABS: 80.0000 mg | ORAL_TABLET | Freq: Every evening | ORAL | 1 refills | Status: AC

## 2024-04-12 MED ORDER — TRULICITY 3 MG/0.5ML ~~LOC~~ SOAJ
3.0000 mg | SUBCUTANEOUS | 2 refills | Status: DC
Start: 2024-04-12 — End: 2024-04-17

## 2024-04-12 MED ORDER — IRBESARTAN 300 MG PO TABS: 300.0000 mg | ORAL_TABLET | Freq: Every day | ORAL | 1 refills | Status: AC

## 2024-04-12 MED ORDER — INSULIN LISPRO PROT & LISPRO (75-25 MIX) 100 UNIT/ML KWIKPEN: 10.0000 [IU] | PEN_INJECTOR | Freq: Two times a day (BID) | SUBCUTANEOUS | 1 refills | Status: DC

## 2024-04-12 NOTE — Progress Notes (Signed)
 Name: Amber Reyes   MRN: [id number]    DOB: [birth year]   Date:04/12/2024       Progress Note  Subjective  Chief Complaint  Chief Complaint  Patient presents with   Hypertension   Medical Management of Chronic Issues   Diabetes   Abdominal Pain    Onset 2 weeks w/ tenderness and constpation   Discussed the use of AI scribe software for clinical note transcription with the patient, who gave verbal consent to proceed.  History of Present Illness Amber Reyes is a 78 year old female with constipation and umbilical hernia who presents for a follow-up visit.  She experiences ongoing constipation despite using stool softeners and Miralax. After taking Miralax two days ago, she had two small bowel movements, but none after taking it yesterday.  She describes a sensation of bulging in the abdominal area, particularly when coughing, which she attributes to her umbilical hernia. The hernia is large and can be pushed in, but it bulges out when she coughs or bears down.  The hernia has been present for a long time, but she feels it has worsened over the last two weeks, associating the worsening with a past episode of pneumonia that involved significant coughing.  Her medical history includes type 2 diabetes, chronic kidney disease stage 3B, hypertension, dyslipidemia, hypertensive heart disease with heart failure, chronic AFib, asthma, and gout. Her diabetes is managed with Humalog  75/25 and Trulicity  3 mg weekly. Her fasting blood sugar levels have been between 109 and 129 mg/dL. She experiences occasional queasiness with Trulicity  but no significant issues. Her blood pressure has been variable. She takes Avapro  and chlortalidone. She uses a CPAP machine for sleep apnea.  She has chronic kidney disease stage 3B, with her last kidney function test showing a GFR of 39. She has a history of anemia of chronic disease and has received Retacrit  injections in the past, but her kidney doctor advised  against further injections due to side effects like headaches and leg cramps.  Her asthma is well-controlled with Advair. She also has a history of gout, managed with allopurinol , and reports no recent symptoms. Her weight has decreased from 364 lbs in March to 345 lbs currently. No chest pain, palpitations, or significant swelling. No issues with orthopnea and uses her CPAP machine every night.    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   Localized, 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/22/2021   Pain in joint of left shoulder 04/22/2021   Tendonitis of shoulder, left 04/22/2021   Hypertension associated with stage 3 chronic kidney disease due to type 2 diabetes mellitus (HCC) 02/06/2020   History of colonic polyps    Polyp of colon    Thoracic aortic atherosclerosis (HCC) 12/30/2016   Controlled type 2 diabetes mellitus with microalbuminuria, with long-term current use of insulin  (HCC) 12/23/2016   Venous insufficiency 05/21/2016   Hypertensive heart disease 05/21/2016   Anemia in chronic kidney disease 12/22/2015   Pulmonary hypertension (HCC) 12/18/2015   Xanthelasma 04/15/2015   Anxiety 04/12/2015   Chronic kidney disease (CKD), stage III (moderate) (HCC) 04/12/2015   Edema of extremities 04/12/2015   Disc disorder of lumbar region 04/12/2015   Asthma, moderate persistent 04/12/2015   Morbid obesity (HCC)  04/12/2015   Obstructive apnea 04/12/2015   Restless leg 04/12/2015   Allergic rhinitis 04/12/2015   Vitamin D  deficiency 04/12/2015   Controlled gout 04/12/2015   Premature atrial contractions 11/29/2013   Atrial fibrillation (HCC) 11/09/2013   Benign essential hypertension    Hyperlipidemia     Past Surgical History:   Procedure Laterality Date   CARDIAC CATHETERIZATION  2002   [facility]   CATARACT EXTRACTION W/PHACO Right 09/15/2021   Procedure: CATARACT EXTRACTION PHACO AND INTRAOCULAR LENS PLACEMENT (IOC) RIGHT 10.19 01:01.8;  Surgeon: Jaye Fallow, MD;  Location: [facility];  Service: Ophthalmology;  Laterality: Right;   CATARACT EXTRACTION W/PHACO Left 09/29/2021   Procedure: CATARACT EXTRACTION PHACO AND INTRAOCULAR LENS PLACEMENT (IOC) LEFT DIABETIC 11.40 01:21.2;  Surgeon: Jaye Fallow, MD;  Location: [facility];  Service: Ophthalmology;  Laterality: Left;   COLONOSCOPY WITH PROPOFOL  N/A 09/25/2018   Procedure: COLONOSCOPY WITH PROPOFOL ;  Surgeon: Therisa Bi, MD;  Location: [facility] END[facility];  Service: Gastroenterology;  Laterality: N/A;   COLONOSCOPY WITH PROPOFOL  N/A 04/13/2019   Procedure: COLONOSCOPY WITH PROPOFOL ;  Surgeon: Therisa Bi, MD;  Location: [facility] END[facility]COPY;  Service: Gastroenterology;  Laterality: N/A;   COLONOSCOPY WITH PROPOFOL  N/A 06/27/2020   Procedure: COLONOSCOPY WITH PROPOFOL ;  Surgeon: Therisa Bi, MD;  Location: [facility] END[facility];  Service: Gastroenterology;  Laterality: N/A;   COLONOSCOPY WITH PROPOFOL  N/A 08/02/2023   Procedure: COLONOSCOPY WITH PROPOFOL ;  Surgeon: Therisa Bi, MD;  Location: [facility] END[facility]COPY;  Service: Gastroenterology;  Laterality: N/A;   PERICARDIUM SURGERY     POLYPECTOMY  08/02/2023   Procedure: POLYPECTOMY;  Surgeon: Therisa Bi, MD;  Location: [facility] ENDOSCOPY;  Service: Gastroenterology;;    Family History  Problem Relation Age of Onset   Heart attack Mother    Hypertension Mother    Bladder Cancer Father    Breast cancer Neg Hx     Social History   Tobacco Use   Smoking status: Never    Passive exposure: Never   Smokeless tobacco: Never   Tobacco comments:    n/a  Substance Use Topics   Alcohol use: No    Alcohol/week: 0.0 standard drinks of alcohol     Current Outpatient Medications:    acetaminophen  (TYLENOL ) 650 MG CR  tablet, Take 650 mg by mouth in the morning and at bedtime., Disp: , Rfl:    Albuterol -Budesonide  (AIRSUPRA ) 90-80 MCG/ACT AERO, Inhale 2 puffs into the lungs 4 (four) times daily as needed., Disp: 10.7 g, Rfl: 1   Alcohol Swabs (DROPSAFE ALCOHOL PREP) 70 % PADS, USE AS DIRECTED, Disp: 300 each, Rfl: 3   allopurinol  (ZYLOPRIM ) 100 MG tablet, TAKE 1 TABLET TWICE DAILY, Disp: 180 tablet, Rfl: 3   apixaban  (ELIQUIS ) 5 MG TABS tablet, Take 1 tablet (5 mg total) by mouth 2 (two) times daily., Disp: 60 tablet, Rfl: 5   benzonatate  (TESSALON ) 100 MG capsule, Take 1 capsule (100 mg total) by mouth every 8 (eight) hours., Disp: 40 capsule, Rfl: 0   Blood Glucose Calibration (TRUE METRIX LEVEL 1) Low SOLN, 1 each by In Vitro route once a week., Disp: 1 each, Rfl: 1   Blood Glucose Monitoring Suppl (TRUE METRIX AIR GLUCOSE METER) w/Device KIT, Inject 1 each into the skin 4 (four) times daily as needed. Check fsbs 4 times daily E11.22, Disp: 1 kit, Rfl: 0   chlorthalidone (HYGROTON) 25 MG tablet, Take 12.5 mg by mouth daily., Disp: , Rfl:    Cholecalciferol  (VITAMIN D ) 2000 UNITS tablet, Take 2,000 Units by mouth  daily., Disp: , Rfl:    colchicine  (COLCRYS ) 0.6 MG tablet, Take 1-2 tablets (0.6-1.2 mg total) by mouth daily as needed., Disp: 30 tablet, Rfl: 0   diltiazem  (CARDIZEM  CD) 240 MG 24 hr capsule, TAKE 1 CAPSULE EVERY DAY, Disp: 90 capsule, Rfl: 3   DROPLET PEN NEEDLES 30G X 8 MM MISC, USE TWICE DAILY WITH INSULIN , Disp: 200 each, Rfl: 3   Elastic Bandages & Supports (MEDICAL COMPRESSION STOCKINGS) MISC, 2 each by Does not apply route daily., Disp: 2 each, Rfl: 2   Ferrous Sulfate (IRON  SUPPLEMENT PO), Take 1 tablet by mouth daily., Disp: , Rfl:    fluticasone  (FLONASE ) 50 MCG/ACT nasal spray, USE 2 SPRAYS IN EACH NOSTRIL EVERY DAY, Disp: 48 g, Rfl: 3   fluticasone -salmeterol (ADVAIR) 250-50 MCG/ACT AEPB, INHALE 1 PUFF INTO THE LUNGS IN THE MORNING AND AT BEDTIME., Disp: 180 each, Rfl: 3   furosemide   (LASIX ) 20 MG tablet, Take 1 tablet (20 mg total) by mouth 2 (two) times daily., Disp: 180 tablet, Rfl: 1   HUMALOG  MIX 75/25 KWIKPEN (75-25) 100 UNIT/ML KwikPen, DIAL AND INJECT 10 UNITS UNDER THE SKIN TWICE DAILY WITH A MEAL. MAX DAILY DOSE 20 UNITS. DISCARD USED PEN AFTER 10 DAYS., Disp: 45 mL, Rfl: 0   irbesartan  (AVAPRO ) 300 MG tablet, TAKE 1 TABLET EVERY DAY, Disp: 90 tablet, Rfl: 0   loratadine  (CLARITIN ) 10 MG tablet, TAKE 1 TABLET EVERY DAY, Disp: 90 tablet, Rfl: 3   Magnesium Chloride 64 MG TBEC, Take 1 tablet by mouth daily., Disp: , Rfl:    pravastatin  (PRAVACHOL ) 80 MG tablet, Take 1 tablet (80 mg total) by mouth every evening., Disp: 90 tablet, Rfl: 1   TRUE METRIX BLOOD GLUCOSE TEST test strip, CHECK BLOOD SUGAR FOUR TIMES DAILY, Disp: 400 strip, Rfl: 3   TRUEplus Lancets 33G MISC, TEST BLOOD SUGAR THREE TIMES DAILY AS NEEDED, Disp: 300 each, Rfl: 3   TRULICITY  3 MG/0.5ML SOAJ, INJECT 3 MG (0.5 ML) UNDER THE SKIN ONCE A WEEK, Disp: 8 mL, Rfl: 0  Allergies  Allergen Reactions   Farxiga  [Dapagliflozin ] Hives    I personally reviewed active problem list, medication list, allergies with the patient/caregiver today.   ROS  Ten systems reviewed and is negative except as mentioned in HPI    Objective Physical Exam  CONSTITUTIONAL: Patient appears well-developed and well-nourished. No distress. HEENT: Head atraumatic, normocephalic, neck supple. CARDIOVASCULAR: Normal rate, regular rhythm and normal heart sounds. No murmur heard. No significant edema in extremities, trace edema on deep palpation. PULMONARY: Effort normal and breath sounds normal. Lungs clear to auscultation. No respiratory distress. ABDOMINAL: Abdomen non-tender, no redness. Large umbilical hernia present, reducible. MUSCULOSKELETAL: sitting on wheelchair PSYCHIATRIC: Patient has a normal mood and affect. Behavior is normal. Judgment and thought content normal.  Vitals:   04/12/24 0801  BP: 126/68  Pulse:  69  Resp: 18  Temp: (!) 97.5 F (36.4 C)  SpO2: 97%  Weight: (!) 345 lb (156.5 kg)  Height: 5' 10 (1.778 m)    Body mass index is 49.5 kg/m.  Recent Results (from the past 2160 hours)  CBC with Differential (Cancer Center Only)     Status: Abnormal   Collection Time: 01/20/24 10:55 AM  Result Value Ref Range   WBC Count 8.1 4.0 - 10.5 K/uL   RBC 3.37 (L) 3.87 - 5.11 MIL/uL   Hemoglobin 9.3 (L) 12.0 - 15.0 g/dL   HCT 69.8 (L) 63.9 - 53.9 %   MCV 89.3  80.0 - 100.0 fL   MCH 27.6 26.0 - 34.0 pg   MCHC 30.9 30.0 - 36.0 g/dL   RDW 84.0 (H) 88.4 - 84.4 %   Platelet Count 206 150 - 400 K/uL   nRBC 0.0 0.0 - 0.2 %   Neutrophils Relative % 71 %   Neutro Abs 5.8 1.7 - 7.7 K/uL   Lymphocytes Relative 15 %   Lymphs Abs 1.2 0.7 - 4.0 K/uL   Monocytes Relative 7 %   Monocytes Absolute 0.6 0.1 - 1.0 K/uL   Eosinophils Relative 6 %   Eosinophils Absolute 0.5 0.0 - 0.5 K/uL   Basophils Relative 1 %   Basophils Absolute 0.1 0.0 - 0.1 K/uL   Immature Granulocytes 0 %   Abs Immature Granulocytes 0.03 0.00 - 0.07 K/uL    Comment: Performed at [facility], [address] [place], [place] [address]  Hemoglobin and Hematocrit (Cancer Center Only)     Status: Abnormal   Collection Time: 02/17/24  9:10 AM  Result Value Ref Range   Hemoglobin 9.6 (L) 12.0 - 15.0 g/dL   HCT 69.5 (L) 63.9 - 53.9 %    Comment: Performed at [facility], [address] [place], [place] [address]    Diabetic Foot Exam:     PHQ2/9:    04/12/2024    8:11 AM 01/13/2024   10:06 AM 12/27/2023    2:08 PM 10/25/2023    8:02 AM 08/30/2023   10:46 AM  Depression screen PHQ 2/9  Decreased Interest 0 0 0 0 0  Down, Depressed, Hopeless 0 0 0 0 0  PHQ - 2 Score 0 0 0 0 0  Altered sleeping 0   0 0  Tired, decreased energy 0   0 0  Change in appetite 0   0 0  Feeling bad or failure about yourself  0   0 0  Trouble concentrating 0   0 0  Moving slowly or fidgety/restless 0   0 0  Suicidal thoughts 0   0  0  PHQ-9 Score 0   0 0  Difficult doing work/chores Not difficult at all   Not difficult at all     phq 9 is negative  Fall Risk:    04/12/2024    8:01 AM 01/13/2024   10:05 AM 10/27/2023    1:49 PM 08/30/2023   10:46 AM 07/07/2023    3:31 PM  Fall Risk   Falls in the past year? 0 0 0 0 0  Number falls in past yr: 0 0 0 0 0  Injury with Fall? 0 0 0 0 0  Risk for fall due to :  No Fall Risks  No Fall Risks No Fall Risks  Follow up Falls evaluation completed Falls prevention discussed;Education provided;Falls evaluation completed  Falls prevention discussed;Education provided;Falls evaluation completed Falls prevention discussed;Falls evaluation completed      Assessment & Plan Large reducible umbilical hernia Large reducible umbilical hernia causing discomfort, not incarcerated. Weight affects surgical repair success. - Refer to general surgery for consultation. - Advise using a back brace or band during bowel movements.  Chronic constipation Infrequent bowel movements despite stool softeners and Miralax. Hernia may contribute. Adjust Miralax dosage for regularity. - Advise Miralax twice daily until regular, then reduce. - Continue stool softeners if no diarrhea.  Type 2 diabetes mellitus with chronic kidney disease stage 3A-3B Diabetes well-controlled. CKD stage 3B with eGFR 39. Anemia affects in-office A1c testing. - Continue Humalog   75/25 10-12 units twice daily before meals. - Continue Trulicity  3 mg once weekly, monitor GI side effects. - Order comprehensive metabolic panel. - Send to lab for A1c testing.  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 No recent heart failure exacerbations. - Continue chlortalidone and Avapro . - Monitor blood pressure at home.  Chronic atrial fibrillation On Cardizem  and Eliquis . No recent AFib symptoms. - Continue Cardizem  and Eliquis .  Morbid obesity (BMI = 40) Lost nearly 20 pounds.  Trulicity  may aid weight loss. - Continue weight management strategies.  Dyslipidemia LDL well-controlled with pravastatin . - Continue pravastatin .  Anemia of chronic kidney disease Managed by hematologist. Stable hemoglobin. No Retacrit  due to side effects. - Continue monitoring with hematologist. - Discuss treatment changes with kidney doctor.  Chronic obstructive asthma, moderate persistent Asthma well-controlled with Advair. - Continue Advair. - Seek pharmacist assistance for medication cost if needed.  Obstructive sleep apnea on CPAP Uses CPAP nightly, helps with fatigue. - Continue using CPAP nightly.  Aortic atherosclerosis with ascending aortic dilation Dilation monitored by cardiologist. - Continue management and follow-up with cardiologist.  Gout, stable on allopurinol  Gout well-controlled with allopurinol . - Continue allopurinol .  Follow-Up Scheduled for follow-up in four months. - Schedule follow-up in four months. - Ensure prescriptions sent to [facility] pharmacy.

## 2024-04-13 ENCOUNTER — Inpatient Hospital Stay: Attending: Oncology

## 2024-04-13 ENCOUNTER — Other Ambulatory Visit: Payer: Self-pay

## 2024-04-13 ENCOUNTER — Ambulatory Visit: Payer: Self-pay | Admitting: Family Medicine

## 2024-04-13 DIAGNOSIS — I5032 Chronic diastolic (congestive) heart failure: Secondary | ICD-10-CM | POA: Insufficient documentation

## 2024-04-13 DIAGNOSIS — I4891 Unspecified atrial fibrillation: Secondary | ICD-10-CM | POA: Insufficient documentation

## 2024-04-13 DIAGNOSIS — D631 Anemia in chronic kidney disease: Secondary | ICD-10-CM | POA: Diagnosis not present

## 2024-04-13 DIAGNOSIS — Z9841 Cataract extraction status, right eye: Secondary | ICD-10-CM | POA: Diagnosis not present

## 2024-04-13 DIAGNOSIS — Z8249 Family history of ischemic heart disease and other diseases of the circulatory system: Secondary | ICD-10-CM | POA: Insufficient documentation

## 2024-04-13 DIAGNOSIS — R9230 Dense breasts, unspecified: Secondary | ICD-10-CM | POA: Insufficient documentation

## 2024-04-13 DIAGNOSIS — E1122 Type 2 diabetes mellitus with diabetic chronic kidney disease: Secondary | ICD-10-CM | POA: Insufficient documentation

## 2024-04-13 DIAGNOSIS — G4730 Sleep apnea, unspecified: Secondary | ICD-10-CM | POA: Insufficient documentation

## 2024-04-13 DIAGNOSIS — J45909 Unspecified asthma, uncomplicated: Secondary | ICD-10-CM | POA: Diagnosis not present

## 2024-04-13 DIAGNOSIS — Z8052 Family history of malignant neoplasm of bladder: Secondary | ICD-10-CM | POA: Insufficient documentation

## 2024-04-13 DIAGNOSIS — N1831 Chronic kidney disease, stage 3a: Secondary | ICD-10-CM | POA: Diagnosis not present

## 2024-04-13 DIAGNOSIS — I872 Venous insufficiency (chronic) (peripheral): Secondary | ICD-10-CM | POA: Insufficient documentation

## 2024-04-13 DIAGNOSIS — I130 Hypertensive heart and chronic kidney disease with heart failure and stage 1 through stage 4 chronic kidney disease, or unspecified chronic kidney disease: Secondary | ICD-10-CM | POA: Diagnosis not present

## 2024-04-13 DIAGNOSIS — Z7901 Long term (current) use of anticoagulants: Secondary | ICD-10-CM | POA: Diagnosis not present

## 2024-04-13 DIAGNOSIS — R5382 Chronic fatigue, unspecified: Secondary | ICD-10-CM | POA: Diagnosis not present

## 2024-04-13 DIAGNOSIS — R519 Headache, unspecified: Secondary | ICD-10-CM | POA: Diagnosis not present

## 2024-04-13 DIAGNOSIS — Z9842 Cataract extraction status, left eye: Secondary | ICD-10-CM | POA: Insufficient documentation

## 2024-04-13 DIAGNOSIS — Z79899 Other long term (current) drug therapy: Secondary | ICD-10-CM | POA: Diagnosis not present

## 2024-04-13 LAB — HEMOGLOBIN A1C
Hgb A1c MFr Bld: 7.1 % — ABNORMAL HIGH (ref <5.7)
Mean Plasma Glucose: 157 mg/dL
eAG (mmol/L): 8.7 mmol/L

## 2024-04-13 LAB — CBC WITH DIFFERENTIAL (CANCER CENTER ONLY)
Abs Immature Granulocytes: 0.04 K/uL (ref 0.00–0.07)
Basophils Absolute: 0.1 K/uL (ref 0.0–0.1)
Basophils Relative: 1 %
Eosinophils Absolute: 0.3 K/uL (ref 0.0–0.5)
Eosinophils Relative: 4 %
HCT: 30.3 % — ABNORMAL LOW (ref 36.0–46.0)
Hemoglobin: 9.7 g/dL — ABNORMAL LOW (ref 12.0–15.0)
Immature Granulocytes: 1 %
Lymphocytes Relative: 18 %
Lymphs Abs: 1.4 K/uL (ref 0.7–4.0)
MCH: 29 pg (ref 26.0–34.0)
MCHC: 32 g/dL (ref 30.0–36.0)
MCV: 90.7 fL (ref 80.0–100.0)
Monocytes Absolute: 0.7 K/uL (ref 0.1–1.0)
Monocytes Relative: 9 %
Neutro Abs: 5.3 K/uL (ref 1.7–7.7)
Neutrophils Relative %: 67 %
Platelet Count: 191 K/uL (ref 150–400)
RBC: 3.34 MIL/uL — ABNORMAL LOW (ref 3.87–5.11)
RDW: 15.9 % — ABNORMAL HIGH (ref 11.5–15.5)
WBC Count: 7.8 K/uL (ref 4.0–10.5)
nRBC: 0 % (ref 0.0–0.2)

## 2024-04-13 LAB — COMPREHENSIVE METABOLIC PANEL WITH GFR
AG Ratio: 1.3 (calc) (ref 1.0–2.5)
ALT: 6 U/L (ref 6–29)
AST: 15 U/L (ref 10–35)
Albumin: 4 g/dL (ref 3.6–5.1)
Alkaline phosphatase (APISO): 103 U/L (ref 37–153)
BUN/Creatinine Ratio: 25 (calc) — ABNORMAL HIGH (ref 6–22)
BUN: 37 mg/dL — ABNORMAL HIGH (ref 7–25)
CO2: 23 mmol/L (ref 20–32)
Calcium: 9.2 mg/dL (ref 8.6–10.4)
Chloride: 104 mmol/L (ref 98–110)
Creat: 1.48 mg/dL — ABNORMAL HIGH (ref 0.60–1.00)
Globulin: 3 g/dL (ref 1.9–3.7)
Glucose, Bld: 130 mg/dL — ABNORMAL HIGH (ref 65–99)
Potassium: 5.3 mmol/L (ref 3.5–5.3)
Sodium: 137 mmol/L (ref 135–146)
Total Bilirubin: 0.4 mg/dL (ref 0.2–1.2)
Total Protein: 7 g/dL (ref 6.1–8.1)
eGFR: 36 mL/min/1.73m2 — ABNORMAL LOW (ref >=60)

## 2024-04-13 LAB — IRON AND TIBC
Iron: 58 ug/dL (ref 28–170)
Saturation Ratios: 21 % (ref 10.4–31.8)
TIBC: 283 ug/dL (ref 250–450)
UIBC: 225 ug/dL

## 2024-04-13 LAB — RETIC PANEL
Immature Retic Fract: 22.7 % — ABNORMAL HIGH (ref 2.3–15.9)
RBC.: 3.37 MIL/uL — ABNORMAL LOW (ref 3.87–5.11)
Retic Count, Absolute: 68.7 K/uL (ref 19.0–186.0)
Retic Ct Pct: 2 % (ref 0.4–3.1)
Reticulocyte Hemoglobin: 32.2 pg (ref >27.9)

## 2024-04-13 LAB — FERRITIN: Ferritin: 119 ng/mL (ref 11–307)

## 2024-04-14 ENCOUNTER — Other Ambulatory Visit: Payer: Self-pay | Admitting: Cardiovascular Disease

## 2024-04-14 DIAGNOSIS — I10 Essential (primary) hypertension: Secondary | ICD-10-CM

## 2024-04-16 ENCOUNTER — Inpatient Hospital Stay

## 2024-04-16 NOTE — Progress Notes (Unsigned)
 Patient ID: Amber Reyes, female   DOB: 12-08-1945, 78 y.o.   MRN: [id number]  Chief Complaint: Umbilical hernia  History of Present Illness Amber Reyes is a 78 y.o. female with a known umbilical hernia small, Audi since birth.  More recently got larger, with appearance irregularity with coughing for her last pneumonia.  Become more tender, up and painful at times.  Other than coughing and straining to have a bowel movement when she is constipated it does not present any other pain or concern.  She is recently increased her dose of Trulicity , and notes a 20 pound weight loss, she is lost a lot of weight being previously well over 400 pounds.  Her pannus seems to be a burden.  She has some chronic back pain, gets easily fatigued with walking short distances.  We reviewed her recent colonoscopy report, in light of her history of constipation.  Past Medical History Past Medical History:  Diagnosis Date   Allergic rhinitis, cause unspecified    Anxiety    Chronic diastolic CHF (congestive heart failure) (HCC)    CKD (chronic kidney disease), stage III (HCC)    Edema    Essential hypertension, benign    Gout, unspecified    Heart murmur    as child   Hyperlipidemia    Lipoma of unspecified site    Other and unspecified disc disorder of lumbar region    Other general symptoms(780.99)    PAC (premature atrial contraction)    Renal insufficiency    Restless legs syndrome (RLS)    Sebaceous cyst    Super obesity    Type II or unspecified type diabetes mellitus without mention of complication, uncontrolled    Unspecified asthma(493.90)    Unspecified disease of pericardium    Unspecified sleep apnea    Unspecified vitamin D  deficiency    Vaginitis and vulvovaginitis, unspecified    Venous insufficiency       Past Surgical History:  Procedure Laterality Date   CARDIAC CATHETERIZATION  2002   [facility]   CATARACT EXTRACTION W/PHACO Right 09/15/2021   Procedure: CATARACT  EXTRACTION PHACO AND INTRAOCULAR LENS PLACEMENT (IOC) RIGHT 10.19 01:01.8;  Surgeon: Jaye Fallow, MD;  Location: [facility];  Service: Ophthalmology;  Laterality: Right;   CATARACT EXTRACTION W/PHACO Left 09/29/2021   Procedure: CATARACT EXTRACTION PHACO AND INTRAOCULAR LENS PLACEMENT (IOC) LEFT DIABETIC 11.40 01:21.2;  Surgeon: Jaye Fallow, MD;  Location: [facility];  Service: Ophthalmology;  Laterality: Left;   COLONOSCOPY WITH PROPOFOL  N/A 09/25/2018   Procedure: COLONOSCOPY WITH PROPOFOL ;  Surgeon: Therisa Bi, MD;  Location: [facility] END[facility]COPY;  Service: Gastroenterology;  Laterality: N/A;   COLONOSCOPY WITH PROPOFOL  N/A 04/13/2019   Procedure: COLONOSCOPY WITH PROPOFOL ;  Surgeon: Therisa Bi, MD;  Location: [facility] END[facility];  Service: Gastroenterology;  Laterality: N/A;   COLONOSCOPY WITH PROPOFOL  N/A 06/27/2020   Procedure: COLONOSCOPY WITH PROPOFOL ;  Surgeon: Therisa Bi, MD;  Location: [facility] ENDOSCOPY;  Service: Gastroenterology;  Laterality: N/A;   COLONOSCOPY WITH PROPOFOL  N/A 08/02/2023   Procedure: COLONOSCOPY WITH PROPOFOL ;  Surgeon: Therisa Bi, MD;  Location: [facility]COPY;  Service: Gastroenterology;  Laterality: N/A;   PERICARDIUM SURGERY     POLYPECTOMY  08/02/2023   Procedure: POLYPECTOMY;  Surgeon: Therisa Bi, MD;  Location: [facility] ENDOSCOPY;  Service: Gastroenterology;;    Allergies  Allergen Reactions   Farxiga  [Dapagliflozin ] Hives    Current Outpatient Medications  Medication Sig Dispense Refill   acetaminophen  (TYLENOL ) 650 MG CR tablet Take 650 mg by  mouth in the morning and at bedtime.     Albuterol -Budesonide  (AIRSUPRA ) 90-80 MCG/ACT AERO Inhale 2 puffs into the lungs 4 (four) times daily as needed. 10.7 g 1   Alcohol Swabs (DROPSAFE ALCOHOL PREP) 70 % PADS USE AS DIRECTED 300 each 3   allopurinol  (ZYLOPRIM ) 100 MG tablet TAKE 1 TABLET TWICE DAILY 180 tablet 3   apixaban  (ELIQUIS ) 5 MG TABS tablet Take 1 tablet (5 mg total) by mouth 2 (two)  times daily. 60 tablet 5   Blood Glucose Calibration (TRUE METRIX LEVEL 1) Low SOLN 1 each by In Vitro route once a week. 1 each 1   Blood Glucose Monitoring Suppl (TRUE METRIX AIR GLUCOSE METER) w/Device KIT Inject 1 each into the skin 4 (four) times daily as needed. Check fsbs 4 times daily E11.22 1 kit 0   chlorthalidone (HYGROTON) 25 MG tablet Take 12.5 mg by mouth daily.     Cholecalciferol  (VITAMIN D ) 2000 UNITS tablet Take 2,000 Units by mouth daily.     colchicine  (COLCRYS ) 0.6 MG tablet Take 1-2 tablets (0.6-1.2 mg total) by mouth daily as needed. 30 tablet 0   diltiazem  (CARDIZEM  CD) 240 MG 24 hr capsule TAKE 1 CAPSULE EVERY DAY 90 capsule 3   DROPLET PEN NEEDLES 30G X 8 MM MISC USE TWICE DAILY WITH INSULIN  200 each 3   Dulaglutide  (TRULICITY ) 3 MG/0.5ML SOAJ Inject 3 mg into the skin once a week. 8 mL 2   Elastic Bandages & Supports (MEDICAL COMPRESSION STOCKINGS) MISC 2 each by Does not apply route daily. 2 each 2   Ferrous Sulfate (IRON  SUPPLEMENT PO) Take 1 tablet by mouth daily.     fluticasone  (FLONASE ) 50 MCG/ACT nasal spray USE 2 SPRAYS IN EACH NOSTRIL EVERY DAY 48 g 3   fluticasone -salmeterol (ADVAIR) 250-50 MCG/ACT AEPB INHALE 1 PUFF INTO THE LUNGS IN THE MORNING AND AT BEDTIME. 180 each 3   furosemide  (LASIX ) 20 MG tablet Take 1 tablet (20 mg total) by mouth 2 (two) times daily. (Patient taking differently: Take 20 mg by mouth as needed.) 180 tablet 1   Insulin  Lispro Prot & Lispro (HUMALOG  MIX 75/25 KWIKPEN) (75-25) 100 UNIT/ML Kwikpen Inject 10-12 Units into the skin 2 (two) times daily. 45 mL 1   irbesartan  (AVAPRO ) 300 MG tablet Take 1 tablet (300 mg total) by mouth daily. 90 tablet 1   loratadine  (CLARITIN ) 10 MG tablet TAKE 1 TABLET EVERY DAY 90 tablet 3   pravastatin  (PRAVACHOL ) 80 MG tablet Take 1 tablet (80 mg total) by mouth every evening. 90 tablet 1   TRUE METRIX BLOOD GLUCOSE TEST test strip CHECK BLOOD SUGAR FOUR TIMES DAILY 400 strip 3   TRUEplus Lancets 33G  MISC TEST BLOOD SUGAR THREE TIMES DAILY AS NEEDED 300 each 3   No current facility-administered medications for this visit.    Family History Family History  Problem Relation Age of Onset   Heart attack Mother    Hypertension Mother    Bladder Cancer Father    Breast cancer Neg Hx       Social History Social History   Tobacco Use   Smoking status: Never    Passive exposure: Never   Smokeless tobacco: Never   Tobacco comments:    n/a  Vaping Use   Vaping status: Never Used  Substance Use Topics   Alcohol use: No    Alcohol/week: 0.0 standard drinks of alcohol   Drug use: No        Review  of Systems  Constitutional: Negative.   HENT: Negative.    Eyes: Negative.   Respiratory:  Positive for shortness of breath.   Cardiovascular:  Positive for palpitations and leg swelling.  Gastrointestinal:  Positive for constipation. Negative for blood in stool, nausea and vomiting.  Genitourinary: Negative.   Skin: Negative.   Neurological: Negative.   Psychiatric/Behavioral: Negative.       Physical Exam Blood pressure (!) 150/80, pulse 76, height 5' 10 (1.778 m), weight (!) 343 lb (155.6 kg), SpO2 99%. Last Weight  Most recent update: 04/17/2024  9:00 AM    Weight  155.6 kg (343 lb)               CONSTITUTIONAL: Well developed, and nourished, appropriately responsive and aware without distress.  Morbidly obese woman sitting in wheelchair on initial presentation. EYES: Sclera non-icteric.   EARS, NOSE, MOUTH AND THROAT: The oropharynx is clear. Oral mucosa is pink and moist.    Hearing is intact to voice.  NECK: Trachea is midline, and there is no jugular venous distension.  LYMPH NODES:  Lymph nodes in the neck are not appreciated. RESPIRATORY:  Normal respiratory effort without pathologic use of accessory muscles. CARDIOVASCULAR: Heart is regular in rate. Well perfused.  GI: The abdomen is obese with evidence of a wide diastases.  Irregular umbilical skin with  soft tissues, very difficult to appreciate the underlying fascial defect.  Otherwise soft, nontender, and nondistended. There were no palpable masses.  I did not appreciate hepatosplenomegaly.  MUSCULOSKELETAL:  Symmetrical muscle tone appears in all four extremities.    SKIN: Skin turgor is normal. No pathologic skin lesions appreciated.  NEUROLOGIC:  Motor and sensation appear grossly normal.  Cranial nerves are grossly without defect. PSYCH:  Alert and oriented to person, place and time. Affect is appropriate for situation.  Data Reviewed I have personally reviewed what is currently available of the patient's imaging, recent labs and medical records.   Labs:     Latest Ref Rng & Units 04/13/2024    8:26 AM 02/17/2024    9:10 AM 01/20/2024   10:55 AM  CBC  WBC 4.0 - 10.5 K/uL 7.8   8.1   Hemoglobin 12.0 - 15.0 g/dL 9.7  9.6  9.3   Hematocrit 36.0 - 46.0 % 30.3  30.4  30.1   Platelets 150 - 400 K/uL 191   206       Latest Ref Rng & Units 04/12/2024    8:49 AM 12/25/2023   11:53 AM 10/25/2023    8:50 AM  CMP  Glucose 65 - 99 mg/dL 869  847  853   BUN 7 - 25 mg/dL 37  32  29   Creatinine 0.60 - 1.00 mg/dL 8.51  8.40  8.75   Sodium 135 - 146 mmol/L 137  132  140   Potassium 3.5 - 5.3 mmol/L 5.3  5.5  4.0   Chloride 98 - 110 mmol/L 104  98  106   CO2 20 - 32 mmol/L 23  19  23    Calcium  8.6 - 10.4 mg/dL 9.2  9.1  8.8   Total Protein 6.1 - 8.1 g/dL 7.0   6.7   Total Bilirubin 0.2 - 1.2 mg/dL 0.4   0.5   AST 10 - 35 U/L 15   14   ALT 6 - 29 U/L 6   6      Imaging: Radiological images personally reviewed:  She reports no prior history of radio logic  evaluation of her abdomen. Within last 24 hrs: No results found.  Assessment    Umbilical hernia, diastases recti, morbid obesity in addition to the following listed below. Patient Active Problem List   Diagnosis Date Noted   Community acquired pneumonia of right lower lobe of lung 12/27/2023   Anemia due to stage 3a chronic kidney  disease (HCC) 10/24/2023   Secondary hyperparathyroidism of renal origin (HCC) 08/25/2022   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   Localized, 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/22/2021   Pain in joint of left shoulder 04/22/2021   Tendonitis of shoulder, left 04/22/2021   Hypertension associated with stage 3 chronic kidney disease due to type 2 diabetes mellitus (HCC) 02/06/2020   History of colonic polyps    Polyp of colon    Thoracic aortic atherosclerosis (HCC) 12/30/2016   Controlled type 2 diabetes mellitus with microalbuminuria, with long-term current use of insulin  (HCC) 12/23/2016   Venous insufficiency 05/21/2016   Hypertensive heart disease with heart failure (HCC) 05/21/2016   Anemia in chronic kidney disease 12/22/2015   Pulmonary hypertension (HCC) 12/18/2015   Xanthelasma 04/15/2015   Anxiety 04/12/2015   Chronic kidney disease (CKD), stage III (moderate) (HCC) 04/12/2015   Edema of extremities 04/12/2015   Disc disorder of lumbar region 04/12/2015   Asthma, moderate persistent 04/12/2015   Morbid obesity (HCC) 04/12/2015   Obstructive sleep apnea on CPAP 04/12/2015   Restless leg 04/12/2015   Allergic rhinitis 04/12/2015   Vitamin D  deficiency 04/12/2015   Controlled gout 04/12/2015   Premature atrial contractions 11/29/2013   Atrial fibrillation (HCC) 11/09/2013   Benign essential hypertension    Hyperlipidemia     Plan    Advised continued every efforts at weight loss, abdominal support for pannus, abdominal binder type or supportive garment.  Follow-up in 3 to 6 months.  Will obtain CT scan to ensure complete evaluation of the anterior abdominal wall, and ensure no other unappreciated complicating features.  As well as evaluate size of defect and degree of diastases. I believe it is in her best interest that we not pursue elective surgery at this time.  Weight loss is definitely going to be in her favor.  And I believe her symptoms are reasonably  tolerated.  We also talked about management of her constipation, and the benefits of fiber supplementation, and provided her the following information: Advised to pursue a goal of 25 to 30 g of fiber daily.  The majority of this may be through natural sources, advised to ensure a minimal daily fiber supplementation.  Various forms of supplements discussed.  Recommend Psyllium husk, with options of mixing with beverage or applesauce to make more tolerable. Strongly advised to consume more fluids(especially in proximity to fiber intake) and to ensure adequate hydration.   Watch color of urine to determine adequacy of hydration.  Clarity is pursued in urine output, and bowel activity that responds and corresponds to significant meal intake.   We need to avoid deferring having bowel movements, advised to take the time at the first sign of sensation, typically following meals, and in the morning.  The need to avoid more frequently, and the presence of flatus may indicate the need for bowel movement.  Do not defer for later.  Addition of MiraLAX (or its generic equivalent) may be needed ensure at least twice daily bowel movements.  If multiple doses of MiraLAX are necessary utilize them. Never skip a day... Do not tolerate a day without a bowel movement unless  you are fasting.  To be regular, we must do the above EVERY day.   Soluble Fiber Dissolves in Water: Soluble fiber dissolves in water to form a gel-like substance. Slows Digestion: This type of fiber slows down digestion, which can help control blood sugar levels and lower cholesterol. Sources: Common sources include oats, beans, apples, citrus fruits, and psyllium. Benefits: Helps manage cholesterol levels. Aids in blood sugar control. Increases healthy gut bacteria, which can lower inflammation and improve digestion.  Insoluble Fiber Does Not Dissolve in Water: Insoluble fiber does not dissolve in water and remains mostly intact as it passes  through the digestive system. Adds Bulk to Stool: It adds bulk to stool, which helps promote regular bowel movements and prevent constipation. Sources: Common sources include whole grains, nuts, beans, and vegetables like cauliflower and potatoes. Benefits: Improves bowel health and regularity. Reduces the risk of colorectal conditions like hemorrhoids and diverticulitis. Supports insulin  sensitivity in people with diabetes.  Both types of fiber are essential for overall health, and it's beneficial to include a 50/50 mix of both in your diet.   I personally spent a total of 45 minutes in the care of the patient today including preparing to see the patient, getting/reviewing separately obtained history, performing a medically appropriate exam/evaluation, counseling and educating, placing orders, and documenting clinical information in the EHR.   These notes generated with voice recognition software. I apologize for typographical errors.  Honor Leghorn M.D., FACS 04/17/2024, 9:56 AM

## 2024-04-17 ENCOUNTER — Ambulatory Visit: Admitting: Surgery

## 2024-04-17 ENCOUNTER — Telehealth: Payer: Self-pay

## 2024-04-17 ENCOUNTER — Other Ambulatory Visit: Payer: Self-pay

## 2024-04-17 ENCOUNTER — Encounter: Payer: Self-pay | Admitting: Surgery

## 2024-04-17 VITALS — BP 150/80 | HR 76 | Ht 70.0 in | Wt 343.0 lb

## 2024-04-17 DIAGNOSIS — K429 Umbilical hernia without obstruction or gangrene: Secondary | ICD-10-CM | POA: Insufficient documentation

## 2024-04-17 DIAGNOSIS — Z6841 Body Mass Index (BMI) 40.0 and over, adult: Secondary | ICD-10-CM | POA: Diagnosis not present

## 2024-04-17 DIAGNOSIS — M6208 Separation of muscle (nontraumatic), other site: Secondary | ICD-10-CM | POA: Diagnosis not present

## 2024-04-17 MED ORDER — TRULICITY 4.5 MG/0.5ML ~~LOC~~ SOAJ: 4.5000 mg | SUBCUTANEOUS | 3 refills | Status: DC

## 2024-04-17 NOTE — Telephone Encounter (Signed)
 Written rx sent back to pharmacy by fax

## 2024-04-17 NOTE — Telephone Encounter (Signed)
 Pt would prefer to stay on Trulicity  3mg  due to having some stomach issues. Patient says she mentioned it during last appt and is still trying to get use to it please advice

## 2024-04-17 NOTE — Addendum Note (Signed)
 Addended by: RENTERIA-GARCIA, Jaqwon Manfred on: 04/17/2024 03:17 PM   Modules accepted: Orders

## 2024-04-17 NOTE — Patient Instructions (Addendum)
 We have scheduled you for a CT Scan of your Abdomen and Pelvis with contrast. This has been scheduled at [facility] on 04/24/24. Please arrive there by 8:30 am and check in through the [facility] entrance, 1st desk on the right. If you need to reschedule your Scan, you may do so by calling [phone]. Please let us  know if you reschedule your scan as we have to get authorization from your insurance for this.   Advised to pursue a goal of 25 to 30 g of fiber daily. The majority of this may be through natural sources, advised to ensure a minimal daily fiber supplementation. Various forms of supplements discussed.  Recommend Psyllium husk(Metamucil), or Benefiber with options of mixing with beverage or applesauce to make more tolerable. Strongly advised to consume more fluids(especially in proximity to fiber intake) and to ensure adequate hydration.  Watch the color of your urine to determine adequacy of hydration. You want your urine to be light yellow to clear in color as bowel activity that responds and corresponds to significant meal intake.   We need to avoid deferring having bowel movements, advised to take the time at the first sign of sensation, typically following meals, and in the morning. The need to avoid more frequently, and the presence of flatus may indicate the need for bowel movement. Do not defer for later.  Addition of MiraLAX (or its generic equivalent) may be needed ensure at least twice daily bowel movements.  If multiple doses of MiraLAX are necessary utilize them. Never skip a day... Do not tolerate a day without a bowel movement unless you are fasting.  To be regular, we must do the above EVERY day.  Soluble Fiber Dissolves in Water: Soluble fiber dissolves in water to form a gel-like substance. Slows Digestion: This type of fiber slows down digestion, which can help control blood sugar levels and lower cholesterol. Sources: Common sources include oats, beans, apples, citrus fruits,  and psyllium. Benefits: Helps manage cholesterol levels. Aids in blood sugar control. Increases healthy gut bacteria, which can lower inflammation and improve digestion.  Insoluble Fiber Does Not Dissolve in Water: Insoluble fiber does not dissolve in water and remains mostly intact as it passes through the digestive system. Adds Bulk to Stool: It adds bulk to stool, which helps promote regular bowel movements and prevent constipation. Sources: Common sources include whole grains, nuts, beans, and vegetables like cauliflower and potatoes. Benefits: Improves bowel health and regularity. Reduces the risk of colorectal conditions like hemorrhoids and diverticulitis. Supports insulin  sensitivity in people with diabetes.  Both types of fiber are essential for overall health, and it's beneficial to include a 50/50 mix of both in your diet.   We would like to get a CT scan of your hernia to see exactly what is going on.   We will call you about your CT results.   Follow-up with our office in 3 months for a reevaluation.   Please call and ask to speak with a nurse if you develop questions or concerns.  Umbilical Hernia, Adult  A hernia is a lump of tissue that pushes through an opening in the muscles. An umbilical hernia happens in the belly, near the belly button. The hernia may contain tissues from the small or large intestine. It may also have fatty tissue that covers the intestines. Umbilical hernias in adults may get worse over time. They need to be treated with surgery. There are several types of umbilical hernias. They include: Indirect hernia.  This occurs just above or below the belly button. It's the most common type of umbilical hernia in adults. Direct hernia. This type occurs in an opening that's formed by the belly button. Reducible hernia. This hernia comes and goes. You may see it only when you strain, cough, or lift something heavy. This type of hernia can be pushed back into  the belly (reduced). Incarcerated hernia. This traps the hernia in the wall of the belly. This type of hernia can't be pushed back into the belly. It can cause a strangulated hernia. Strangulated hernia. This hernia cuts off blood flow to the tissues inside the hernia. The tissues can die if this happens. This type of hernia must be treated right away. What are the causes? An umbilical hernia happens when tissue inside the belly pushes through an opening in the muscles of the belly. What increases the risk? You're more likely to get this hernia if: You strain while lifting or pushing heavy objects. You've had several pregnancies. You have a condition that puts pressure on your belly, and you've had it for a long time. These include: Obesity. A buildup of fluid inside your belly. Vomiting or coughing all the time. Trouble pooping (constipation). You've had surgery that weakened the muscles in the belly. What are the signs or symptoms? The main symptom of this condition is a bulge at the belly button or near it. The bulge does not cause pain. Other symptoms depend on the type of hernia you have. A reducible hernia may be seen only when you strain, cough, or lift something heavy. Other symptoms may include: Dull pain. A feeling of pressure. An incarcerated hernia may cause very bad pain. Also, you may: Vomit or feel like you may vomit. Not be able to pass gas. A strangulated hernia may cause: Pain that gets worse and worse. Vomiting, or feeling like you may vomit. Pain when you press on the hernia. Change of color on the skin over the hernia. The skin may become red or purple. Trouble pooping. Blood in the poop. How is this diagnosed? This condition may be diagnosed based on: Your symptoms and medical history. A physical exam. You may be asked to cough or strain while standing. These actions will put pressure inside your belly. The pressure can force the hernia through the opening in  your muscles. Your health care provider may try to push the hernia back into your belly (reduce). How is this treated? Surgery is the only treatment for an umbilical hernia. Surgery for a strangulated hernia must be done right away. If you have a small hernia that's not incarcerated, you may need to lose weight before the surgery is done. Follow these instructions at home: Managing constipation You may need to take these actions to prevent trouble pooping. This will help to prevent straining. Drink enough fluid to keep your pee (urine) pale yellow. Take over-the-counter or prescription medicines. Eat foods that are high in fiber, such as beans, whole grains, and fresh fruits and vegetables. Limit foods that are high in fat and sugars, such as fried or sweet foods. General instructions Do not try to push the hernia back in. Lose weight, if told by your provider. Watch your hernia for any changes in color or size. Tell your provider if any changes occur. You may need to avoid activities that put pressure on your hernia. You may have to avoid lifting. Ask your provider how much you can safely lift. Take over-the-counter and prescription medicines only  as told by your provider. Contact a health care provider if: Your hernia gets larger or feels hard. Your hernia becomes painful. You get a fever or chills. Get help right away if: You get very bad pain near the area of the hernia, and the pain comes on suddenly. You have pain and you vomit or feel like you may vomit. The skin over your hernia changes color. These symptoms may be an emergency. Get help right away. Call 911. Do not wait to see if the symptoms go away. Do not drive yourself to the hospital. This information is not intended to replace advice given to you by your health care provider. Make sure you discuss any questions you have with your health care provider. Document Revised: 11/30/2022 Document Reviewed: 11/30/2022 Elsevier  Patient Education  2024 [employer].

## 2024-04-20 ENCOUNTER — Inpatient Hospital Stay

## 2024-04-20 ENCOUNTER — Encounter: Payer: Self-pay | Admitting: Oncology

## 2024-04-20 ENCOUNTER — Inpatient Hospital Stay: Admitting: Oncology

## 2024-04-20 VITALS — BP 145/52 | HR 73 | Temp 97.0°F | Resp 16 | Wt 349.0 lb

## 2024-04-20 DIAGNOSIS — E1122 Type 2 diabetes mellitus with diabetic chronic kidney disease: Secondary | ICD-10-CM | POA: Diagnosis not present

## 2024-04-20 DIAGNOSIS — R519 Headache, unspecified: Secondary | ICD-10-CM | POA: Diagnosis not present

## 2024-04-20 DIAGNOSIS — G4730 Sleep apnea, unspecified: Secondary | ICD-10-CM | POA: Diagnosis not present

## 2024-04-20 DIAGNOSIS — I4891 Unspecified atrial fibrillation: Secondary | ICD-10-CM | POA: Diagnosis not present

## 2024-04-20 DIAGNOSIS — N1831 Chronic kidney disease, stage 3a: Secondary | ICD-10-CM | POA: Diagnosis not present

## 2024-04-20 DIAGNOSIS — D631 Anemia in chronic kidney disease: Secondary | ICD-10-CM | POA: Diagnosis not present

## 2024-04-20 DIAGNOSIS — I5032 Chronic diastolic (congestive) heart failure: Secondary | ICD-10-CM | POA: Diagnosis not present

## 2024-04-20 DIAGNOSIS — I130 Hypertensive heart and chronic kidney disease with heart failure and stage 1 through stage 4 chronic kidney disease, or unspecified chronic kidney disease: Secondary | ICD-10-CM | POA: Diagnosis not present

## 2024-04-20 NOTE — Progress Notes (Signed)
 Per MD LOS: No retacrit .

## 2024-04-20 NOTE — Assessment & Plan Note (Addendum)
 Labs are reviewed and discussed with patient. Anemia is likely due to chronic kidney disease. Lab Results  Component Value Date   HGB 9.7 (L) 04/13/2024   TIBC 283 04/13/2024   IRONPCTSAT 21 04/13/2024   FERRITIN 119 04/13/2024     She has had severe headache after recent retacrit  injection.  Hold off ESA.  Hb is stable.  Recommend patient to take oral iron  supplementation.

## 2024-04-20 NOTE — Progress Notes (Signed)
 Hematology/Oncology Progress note [phone] [phone]           REFERRING PROVIDER: Sowles, Krichna, MD   CHIEF COMPLAINTS/REASON FOR VISIT:   anemia in chronic kidney disease.   ASSESSMENT & PLAN:   Anemia due to stage 3a chronic kidney disease (HCC) Labs are reviewed and discussed with patient. Anemia is likely due to chronic kidney disease. Lab Results  Component Value Date   HGB 9.7 (L) 04/13/2024   TIBC 283 04/13/2024   IRONPCTSAT 21 04/13/2024   FERRITIN 119 04/13/2024     She has had severe headache after recent retacrit  injection.  Hold off ESA.  Hb is stable.  Recommend patient to take oral iron  supplementation.     Orders Placed This Encounter  Procedures   CBC with Differential (Cancer Center Only)    Standing Status:   Future    Expected Date:   07/21/2024    Expiration Date:   10/19/2024   Iron  and TIBC    Standing Status:   Future    Expected Date:   07/21/2024    Expiration Date:   10/19/2024   Ferritin    Standing Status:   Future    Expected Date:   07/21/2024    Expiration Date:   10/19/2024   Retic Panel    Standing Status:   Future    Expected Date:   07/21/2024    Expiration Date:   10/19/2024   Follow-up 3 months.  All questions were answered. The patient knows to call the clinic with any problems, questions or concerns.  Zelphia Cap, MD, PhD [facility] Hematology Oncology 04/20/2024   HISTORY OF PRESENTING ILLNESS:   Amber Reyes is a  78 y.o.  female with PMH listed below was seen in consultation at the request of  Sowles, Krichna, MD  for evaluation of anemia and chronic kidney disease.  Patient has a chronic anemia history.  Baseline hemoglobin is above 10. Recent blood work showed hemoglobin has dropped below 10 and has been in the mid 9s. Patient reports chronic fatigue.  She has chronic kidney disease stage IIIa. Denies hematochezia, hematuria, hematemesis, epistaxis, black tarry stool or easy  bruising.  Last colonoscopy was in December 2024. Patient takes Eliquis  for atrial fibrillation.  INTERVAL HISTORY Amber Reyes is a 78 y.o. female who has above history reviewed by me today presents for follow up visit for anemia due to chronic kidney disease Patient received IV Venofer  treatments.  She tolerated well.  Received Retacrit  on 01/20/2024 and 02/17/2024.  After the 2nd Retacrit  injection, she experienced higher blood pressure and severe headache for 4 days.   MEDICAL HISTORY:  Past Medical History:  Diagnosis Date   Allergic rhinitis, cause unspecified    Anxiety    Chronic diastolic CHF (congestive heart failure) (HCC)    CKD (chronic kidney disease), stage III (HCC)    Edema    Essential hypertension, benign    Gout, unspecified    Heart murmur    as child   Hyperlipidemia    Lipoma of unspecified site    Other and unspecified disc disorder of lumbar region    Other general symptoms(780.99)    PAC (premature atrial contraction)    Renal insufficiency    Restless legs syndrome (RLS)    Sebaceous cyst    Super obesity    Type II or unspecified type diabetes mellitus without mention of complication, uncontrolled    Unspecified asthma(493.90)    Unspecified  disease of pericardium    Unspecified sleep apnea    Unspecified vitamin D  deficiency    Vaginitis and vulvovaginitis, unspecified    Venous insufficiency     SURGICAL HISTORY: Past Surgical History:  Procedure Laterality Date   CARDIAC CATHETERIZATION  2002   [facility]   CATARACT EXTRACTION W/PHACO Right 09/15/2021   Procedure: CATARACT EXTRACTION PHACO AND INTRAOCULAR LENS PLACEMENT (IOC) RIGHT 10.19 01:01.8;  Surgeon: Jaye Fallow, MD;  Location: [facility];  Service: Ophthalmology;  Laterality: Right;   CATARACT EXTRACTION W/PHACO Left 09/29/2021   Procedure: CATARACT EXTRACTION PHACO AND INTRAOCULAR LENS PLACEMENT (IOC) LEFT DIABETIC 11.40 01:21.2;  Surgeon: Jaye Fallow, MD;   Location: [facility];  Service: Ophthalmology;  Laterality: Left;   COLONOSCOPY WITH PROPOFOL  N/A 09/25/2018   Procedure: COLONOSCOPY WITH PROPOFOL ;  Surgeon: Therisa Bi, MD;  Location[facility] END[facility]COPY;  Service: Gastroenterology;  Laterality: N/A;   COLONOSCOPY WITH PROPOFOL  N/A 04/13/2019   Procedure: COLONOSCOPY WITH PROPOFOL ;  Surgeon: Therisa Bi, MD;  Location: [facility] ENDOSCOPY;  Service: Gastroenterology;  Laterality: N/A;   COLONOSCOPY WITH PROPOFOL  N/A 06/27/2020   Procedure: COLONOSCOPY WITH PROPOFOL ;  Surgeon: Therisa Bi, MD;  Location: [facility] ENDOSCOPY;  Service: Gastroenterology;  Laterality: N/A;   COLONOSCOPY WITH PROPOFOL  N/A 08/02/2023   Procedure: COLONOSCOPY WITH PROPOFOL ;  Surgeon: Therisa Bi, MD;  Location: [facility] ENDOSCOPY;  Service: Gastroenterology;  Laterality: N/A;   PERICARDIUM SURGERY     POLYPECTOMY  08/02/2023   Procedure: POLYPECTOMY;  Surgeon: Therisa Bi, MD;  Location: [facility] ENDOSCOPY;  Service: Gastroenterology;;    SOCIAL HISTORY: Social History   Socioeconomic History   Marital status: Married    Spouse name: Not on file   Number of children: 3   Years of education: Not on file   Highest education level: Associate degree: academic program  Occupational History   Occupation: retired  Tobacco Use   Smoking status: Never    Passive exposure: Never   Smokeless tobacco: Never   Tobacco comments:    n/a  Vaping Use   Vaping status: Never Used  Substance and Sexual Activity   Alcohol use: No    Alcohol/week: 0.0 standard drinks of alcohol   Drug use: No   Sexual activity: Not Currently  Other Topics Concern   Not on file  Social History Narrative   Not on file   Social Drivers of Health   Financial Resource Strain: Low Risk  (07/07/2023)   Overall Financial Resource Strain (CARDIA)    Difficulty of Paying Living Expenses: Not very hard  Food Insecurity: No Food Insecurity (07/07/2023)   Hunger Vital Sign    Worried About Running Out of  Food in the Last Year: Never true    Ran Out of Food in the Last Year: Never true  Transportation Needs: No Transportation Needs (06/24/2022)   PRAPARE - Administrator, Civil Service (Medical): No    Lack of Transportation (Non-Medical): No  Physical Activity: Inactive (07/07/2023)   Exercise Vital Sign    Days of Exercise per Week: 0 days    Minutes of Exercise per Session: 0 min  Stress: No Stress Concern Present (07/07/2023)   [employer] of Occupational Health - Occupational Stress Questionnaire    Feeling of Stress : Not at all  Social Connections: Moderately Isolated (07/07/2023)   Social Connection and Isolation Panel    Frequency of Communication with Friends and Family: More than three times a week    Frequency of Social Gatherings with  Friends and Family: Twice a week    Attends Religious Services: Never    Database administrator or Organizations: No    Attends Banker Meetings: Never    Marital Status: Married  Catering manager Violence: Not At Risk (07/07/2023)   Humiliation, Afraid, Rape, and Kick questionnaire    Fear of Current or Ex-Partner: No    Emotionally Abused: No    Physically Abused: No    Sexually Abused: No    FAMILY HISTORY: Family History  Problem Relation Age of Onset   Heart attack Mother    Hypertension Mother    Bladder Cancer Father    Breast cancer Neg Hx     ALLERGIES:  is allergic to farxiga  [dapagliflozin ].  MEDICATIONS:  Current Outpatient Medications  Medication Sig Dispense Refill   acetaminophen  (TYLENOL ) 650 MG CR tablet Take 650 mg by mouth in the morning and at bedtime.     Albuterol -Budesonide  (AIRSUPRA ) 90-80 MCG/ACT AERO Inhale 2 puffs into the lungs 4 (four) times daily as needed. 10.7 g 1   Alcohol Swabs (DROPSAFE ALCOHOL PREP) 70 % PADS USE AS DIRECTED 300 each 3   allopurinol  (ZYLOPRIM ) 100 MG tablet TAKE 1 TABLET TWICE DAILY 180 tablet 3   apixaban  (ELIQUIS ) 5 MG TABS tablet Take 1 tablet  (5 mg total) by mouth 2 (two) times daily. 60 tablet 5   Blood Glucose Calibration (TRUE METRIX LEVEL 1) Low SOLN 1 each by In Vitro route once a week. 1 each 1   Blood Glucose Monitoring Suppl (TRUE METRIX AIR GLUCOSE METER) w/Device KIT Inject 1 each into the skin 4 (four) times daily as needed. Check fsbs 4 times daily E11.22 1 kit 0   chlorthalidone (HYGROTON) 25 MG tablet Take 12.5 mg by mouth daily.     Cholecalciferol  (VITAMIN D ) 2000 UNITS tablet Take 2,000 Units by mouth daily.     colchicine  (COLCRYS ) 0.6 MG tablet Take 1-2 tablets (0.6-1.2 mg total) by mouth daily as needed. 30 tablet 0   diltiazem  (CARDIZEM  CD) 240 MG 24 hr capsule TAKE 1 CAPSULE EVERY DAY 90 capsule 3   DROPLET PEN NEEDLES 30G X 8 MM MISC USE TWICE DAILY WITH INSULIN  200 each 3   Elastic Bandages & Supports (MEDICAL COMPRESSION STOCKINGS) MISC 2 each by Does not apply route daily. 2 each 2   Ferrous Sulfate (IRON  SUPPLEMENT PO) Take 1 tablet by mouth daily.     fluticasone  (FLONASE ) 50 MCG/ACT nasal spray USE 2 SPRAYS IN EACH NOSTRIL EVERY DAY 48 g 3   fluticasone -salmeterol (ADVAIR) 250-50 MCG/ACT AEPB INHALE 1 PUFF INTO THE LUNGS IN THE MORNING AND AT BEDTIME. 180 each 3   furosemide  (LASIX ) 20 MG tablet Take 1 tablet (20 mg total) by mouth 2 (two) times daily. (Patient taking differently: Take 20 mg by mouth as needed.) 180 tablet 1   Insulin  Lispro Prot & Lispro (HUMALOG  MIX 75/25 KWIKPEN) (75-25) 100 UNIT/ML Kwikpen Inject 10-12 Units into the skin 2 (two) times daily. 45 mL 1   irbesartan  (AVAPRO ) 300 MG tablet Take 1 tablet (300 mg total) by mouth daily. 90 tablet 1   loratadine  (CLARITIN ) 10 MG tablet TAKE 1 TABLET EVERY DAY 90 tablet 3   pravastatin  (PRAVACHOL ) 80 MG tablet Take 1 tablet (80 mg total) by mouth every evening. 90 tablet 1   TRUE METRIX BLOOD GLUCOSE TEST test strip CHECK BLOOD SUGAR FOUR TIMES DAILY 400 strip 3   TRUEplus Lancets 33G MISC TEST BLOOD SUGAR  THREE TIMES DAILY AS NEEDED 300 each 3    No current facility-administered medications for this visit.    Review of Systems  Constitutional:  Positive for fatigue. Negative for appetite change, chills and fever.  HENT:   Negative for hearing loss and voice change.   Eyes:  Negative for eye problems.  Respiratory:  Negative for chest tightness and cough.   Cardiovascular:  Negative for chest pain.  Gastrointestinal:  Negative for abdominal distention, abdominal pain and blood in stool.  Endocrine: Negative for hot flashes.  Genitourinary:  Negative for difficulty urinating and frequency.   Musculoskeletal:  Negative for arthralgias.  Skin:  Negative for itching and rash.  Neurological:  Negative for extremity weakness.  Hematological:  Negative for adenopathy.  Psychiatric/Behavioral:  Negative for confusion.    PHYSICAL EXAMINATION: ECOG PERFORMANCE STATUS: 1 - Symptomatic but completely ambulatory Vitals:   04/20/24 1044  BP: (!) 145/52  Pulse: 73  Resp: 16  Temp: (!) 97 F (36.1 C)  SpO2: (!) 10%   Filed Weights   04/20/24 1044  Weight: (!) 349 lb (158.3 kg)    Physical Exam Constitutional:      General: She is not in acute distress.    Appearance: She is obese.  HENT:     Head: Normocephalic and atraumatic.  Eyes:     General: No scleral icterus. Cardiovascular:     Rate and Rhythm: Normal rate. Rhythm irregular.     Heart sounds: Murmur heard.  Pulmonary:     Effort: Pulmonary effort is normal. No respiratory distress.     Breath sounds: No wheezing.  Abdominal:     General: Bowel sounds are normal. There is no distension.     Palpations: Abdomen is soft.  Musculoskeletal:        General: Normal range of motion.     Cervical back: Normal range of motion and neck supple.  Skin:    Findings: No erythema or rash.  Neurological:     Mental Status: She is alert and oriented to person, place, and time. Mental status is at baseline.  Psychiatric:        Mood and Affect: Mood normal.      LABORATORY DATA:  I have reviewed the data as listed    Latest Ref Rng & Units 04/13/2024    8:26 AM 02/17/2024    9:10 AM 01/20/2024   10:55 AM  CBC  WBC 4.0 - 10.5 K/uL 7.8   8.1   Hemoglobin 12.0 - 15.0 g/dL 9.7  9.6  9.3   Hematocrit 36.0 - 46.0 % 30.3  30.4  30.1   Platelets 150 - 400 K/uL 191   206       Latest Ref Rng & Units 04/12/2024    8:49 AM 12/25/2023   11:53 AM 10/25/2023    8:50 AM  CMP  Glucose 65 - 99 mg/dL 869  847  853   BUN 7 - 25 mg/dL 37  32  29   Creatinine 0.60 - 1.00 mg/dL 8.51  8.40  8.75   Sodium 135 - 146 mmol/L 137  132  140   Potassium 3.5 - 5.3 mmol/L 5.3  5.5  4.0   Chloride 98 - 110 mmol/L 104  98  106   CO2 20 - 32 mmol/L 23  19  23    Calcium  8.6 - 10.4 mg/dL 9.2  9.1  8.8   Total Protein 6.1 - 8.1 g/dL 7.0   6.7  Total Bilirubin 0.2 - 1.2 mg/dL 0.4   0.5   AST 10 - 35 U/L 15   14   ALT 6 - 29 U/L 6   6       RADIOGRAPHIC STUDIES: I have personally reviewed the radiological images as listed and agreed with the findings in the report. DG Chest 2 View Result Date: 02/03/2024 CLINICAL DATA:  Follow-up community acquired pneumonia. EXAM: CHEST - 2 VIEW COMPARISON:  12/25/2023. FINDINGS: Cardiac silhouette is mildly enlarged, stable. No mediastinal or hilar masses. No evidence of adenopathy. Clear lungs. The previously noted right lower lung consolidation has resolved. No pleural effusion or pneumothorax. Skeletal structures are intact. IMPRESSION: 1. No active disease of the chest. 2. Resolved right lower lung pneumonia. Electronically Signed   By: Alm Parkins M.D.   On: 02/03/2024 09:19   MM 3D SCREENING MAMMOGRAM BILATERAL BREAST Result Date: 02/02/2024 CLINICAL DATA:  Screening. EXAM: DIGITAL SCREENING BILATERAL MAMMOGRAM WITH TOMOSYNTHESIS AND CAD TECHNIQUE: Bilateral screening digital craniocaudal and mediolateral oblique mammograms were obtained. Bilateral screening digital breast tomosynthesis was performed. The images were evaluated  with computer-aided detection. COMPARISON:  Previous exam(s). ACR Breast Density Category b: There are scattered areas of fibroglandular density. FINDINGS: There are no findings suspicious for malignancy. IMPRESSION: No mammographic evidence of malignancy. A result letter of this screening mammogram will be mailed directly to the patient. RECOMMENDATION: Screening mammogram in one year. (Code:SM-B-01Y) BI-RADS CATEGORY  1: Negative. Electronically Signed   By: Craig Farr M.D.   On: 02/02/2024 11:32

## 2024-04-24 ENCOUNTER — Telehealth: Payer: Self-pay

## 2024-04-24 ENCOUNTER — Ambulatory Visit
Admission: RE | Admit: 2024-04-24 | Discharge: 2024-04-24 | Disposition: A | Discharge status: Home / Self Care | Source: Ambulatory Visit | Attending: Surgery | Admitting: Surgery

## 2024-04-24 DIAGNOSIS — N281 Cyst of kidney, acquired: Secondary | ICD-10-CM | POA: Diagnosis not present

## 2024-04-24 DIAGNOSIS — K429 Umbilical hernia without obstruction or gangrene: Secondary | ICD-10-CM | POA: Insufficient documentation

## 2024-04-24 DIAGNOSIS — D259 Leiomyoma of uterus, unspecified: Secondary | ICD-10-CM | POA: Diagnosis not present

## 2024-04-24 DIAGNOSIS — K439 Ventral hernia without obstruction or gangrene: Secondary | ICD-10-CM | POA: Diagnosis not present

## 2024-04-24 DIAGNOSIS — K8020 Calculus of gallbladder without cholecystitis without obstruction: Secondary | ICD-10-CM | POA: Diagnosis not present

## 2024-04-24 MED ORDER — IOHEXOL 300 MG/ML  SOLN
100.0000 mL | Freq: Once | INTRAMUSCULAR | Status: AC | PRN
  Administered 2024-04-24: 100 mL via INTRAVENOUS

## 2024-04-24 NOTE — Telephone Encounter (Signed)
 Copied from CRM [id number]. Topic: Clinical - Prescription Issue >> Apr 24, 2024  1:45 PM Amy B wrote: Reason for CRM: Patient states her refills of Humalog  and Trulicity  were sent to the wrong pharmacy.  They should go through mail order with [facility].  Please advise.

## 2024-04-25 ENCOUNTER — Other Ambulatory Visit: Payer: Self-pay | Admitting: Family Medicine

## 2024-04-25 DIAGNOSIS — N183 Chronic kidney disease, stage 3 unspecified: Secondary | ICD-10-CM

## 2024-04-25 MED ORDER — TRULICITY 3 MG/0.5ML ~~LOC~~ SOAJ
3.0000 mg | SUBCUTANEOUS | 3 refills | Status: DC
Start: 2024-04-25 — End: 2024-05-04

## 2024-04-25 MED ORDER — INSULIN LISPRO PROT & LISPRO (75-25 MIX) 100 UNIT/ML KWIKPEN: 10.0000 [IU] | PEN_INJECTOR | Freq: Two times a day (BID) | SUBCUTANEOUS | 3 refills | Status: DC

## 2024-05-02 ENCOUNTER — Other Ambulatory Visit: Payer: Self-pay | Admitting: Pharmacist

## 2024-05-02 ENCOUNTER — Encounter: Payer: Self-pay | Admitting: Pharmacist

## 2024-05-02 ENCOUNTER — Other Ambulatory Visit: Payer: Self-pay

## 2024-05-02 DIAGNOSIS — I10 Essential (primary) hypertension: Secondary | ICD-10-CM

## 2024-05-02 DIAGNOSIS — E1122 Type 2 diabetes mellitus with diabetic chronic kidney disease: Secondary | ICD-10-CM

## 2024-05-02 DIAGNOSIS — J4540 Moderate persistent asthma, uncomplicated: Secondary | ICD-10-CM

## 2024-05-02 MED ORDER — INSULIN LISPRO PROT & LISPRO (75-25 MIX) 100 UNIT/ML KWIKPEN
10.0000 [IU] | PEN_INJECTOR | Freq: Two times a day (BID) | SUBCUTANEOUS | 3 refills | Status: AC
Start: 2024-05-02 — End: ?

## 2024-05-02 NOTE — Telephone Encounter (Signed)
 This encounter was created in error - please disregard.

## 2024-05-02 NOTE — Progress Notes (Unsigned)
 05/02/2024 Name: Amber Reyes MRN: [id number] DOB: [birth year]  No chief complaint on file.  Receive a voicemail message from patient requesting a call back about her medications.   Return call to patient.  Amber Reyes is a 78 y.o. year old female who was referred to the pharmacist for assistance in managing medication access.      Subjective:  Care Team: Primary Care Provider: Sowles, Krichna, MD ; Next Scheduled Visit: 08/29/2024 Cardiologist: Darron Deatrice LABOR, MD Nephrologist: Leavy Aloysius Mace, MD Hematology: Babara Call, MD; Next Scheduled Visit: 08/03/2024  Medication Access/Adherence  Current Pharmacy:  [id number] - [place], [place] - [address] AT [facility] OF [address] [place] [address] Phone: [phone] Fax: [phone]  [facility] - [place], [place] - [address] [place] [address] Phone: [phone] Fax: [phone]  [facility] - [place], [place] - [address] [place] [address] Phone: [phone] Fax: [phone]  [facility] - [address] [place] [address] Phone: [phone] Fax: [phone]  [facility] - [place], [place] - [address] [place] [address] Phone: [phone] Fax: [phone]   Patient reports affordability concerns with their medications: No  Patient reports access/transportation concerns to their pharmacy: No  Patient reports adherence concerns with their medications:  No     Reports that she has been having difficulty with obtaining her Trulicity  and Humalog  Mix. Note patient enrolled in patient assistance for Trulicity  and Humalog  Mix from [employer] patient assistance through 08/22/2024. - From review of chart, note patient contacted the office on 04/24/2024 requesting  refills of Trulicity  and Humalog  Mix be sent to [facility] patient assistance program. Appears that both prescriptions were sent on 9/3 to [facility] (a direct to consumer purchasing program through the manufacturer) rather than the PAP program.    Patient also requests assistance with understanding the cost of her Wixela inhaler through her [facility] Medicare coverage    Diabetes:   Current medications:  - Trulicity  3 mg weekly - Humalog  Mix 75/25 Kwikpen - 12 units twice daily before breakfast and supper   Current glucose readings: morning fasting readings ranging: 108-130   Patient denies hypoglycemic s/sx including dizziness, shakiness, sweating   Statin therapy: pravastatin  80 mg daily   Current medication access support: enrolled in patient assistance for Trulicity  and Humalog  Mix from [employer] patient assistance through 08/22/2024 - Patient confirms she also contacted [employer] patient assistance program herself and already received a refill of Trulicity  from [employer] via mail yesterday   Hypertension/Atrial Fibrillation/CHF:   Current medications:  Rate Control: diltiazem  CD 240 mg daily Anticoagulation Regimen: Eliquis  5 mg twice daily ACEi/ARB/ARNI: irbesartan  300 mg daily Diuretics:  - chlorthalidone 25 mg - 1/2 tablet (12.5 mg) daily - furosemide  20 mg - 1 tablet daily as needed   Reports last checked using new upper arm blood pressure monitor:  - 9/3: 126/68, HR 69  *Attributes recent elevated office visit BP readings to recent appointments for infusions   Patient denies hypotensive s/sx including dizziness, lightheadedness.    Current medication access support: received patient assistance for Eliquis  through [employer]     Asthma/COPD/Allergic Rhinitis:   Current medications: - Wixela inhaler 250-50 mcg/act - 1 puff twice daily - albuterol  HFA inhaler - 2  puffs every 6 hours as needed for wheezing/shortness of breath - fluticasone  nasal spray - 2  sprays in each nostril daily - loratadine  10 mg daily   Confirms rinses and spits out after each use of Wixela inhaler   Reports trying to avoid triggers such as pollen     Objective:  Lab Results  Component Value Date   HGBA1C 7.1 (H) 04/12/2024    Lab Results  Component Value Date   CREATININE 1.48 (H) 04/12/2024   BUN 37 (H) 04/12/2024   NA 137 04/12/2024   K 5.3 04/12/2024   CL 104 04/12/2024   CO2 23 04/12/2024    Lab Results  Component Value Date   CHOL 112 10/25/2023   HDL 52 10/25/2023   LDLCALC 46 10/25/2023   TRIG 67 10/25/2023   CHOLHDL 2.2 10/25/2023   BP Readings from Last 3 Encounters:  04/20/24 (!) 145/52  04/17/24 (!) 150/80  04/12/24 126/68   Pulse Readings from Last 3 Encounters:  04/20/24 73  04/17/24 76  04/12/24 69    Current Outpatient Medications on File Prior to Visit  Medication Sig Dispense Refill   apixaban  (ELIQUIS ) 5 MG TABS tablet Take 1 tablet (5 mg total) by mouth 2 (two) times daily. 60 tablet 5   chlorthalidone (HYGROTON) 25 MG tablet Take 12.5 mg by mouth daily.     diltiazem  (CARDIZEM  CD) 240 MG 24 hr capsule TAKE 1 CAPSULE EVERY DAY 90 capsule 3   fluticasone -salmeterol (ADVAIR) 250-50 MCG/ACT AEPB INHALE 1 PUFF INTO THE LUNGS IN THE MORNING AND AT BEDTIME. 180 each 3   irbesartan  (AVAPRO ) 300 MG tablet Take 1 tablet (300 mg total) by mouth daily. 90 tablet 1   acetaminophen  (TYLENOL ) 650 MG CR tablet Take 650 mg by mouth in the morning and at bedtime.     Albuterol -Budesonide  (AIRSUPRA ) 90-80 MCG/ACT AERO Inhale 2 puffs into the lungs 4 (four) times daily as needed. 10.7 g 1   Alcohol Swabs (DROPSAFE ALCOHOL PREP) 70 % PADS USE AS DIRECTED 300 each 3   allopurinol  (ZYLOPRIM ) 100 MG tablet TAKE 1 TABLET TWICE DAILY 180 tablet 3   Blood Glucose Calibration (TRUE METRIX LEVEL 1) Low SOLN 1 each by In Vitro route once a week. 1 each 1   Blood Glucose Monitoring Suppl (TRUE METRIX AIR GLUCOSE METER) w/Device KIT Inject 1 each  into the skin 4 (four) times daily as needed. Check fsbs 4 times daily E11.22 1 kit 0   Cholecalciferol  (VITAMIN D ) 2000 UNITS tablet Take 2,000 Units by mouth daily.     colchicine  (COLCRYS ) 0.6 MG tablet Take 1-2 tablets (0.6-1.2 mg total) by mouth daily as needed. 30 tablet 0   DROPLET PEN NEEDLES 30G X 8 MM MISC USE TWICE DAILY WITH INSULIN  200 each 3   Elastic Bandages & Supports (MEDICAL COMPRESSION STOCKINGS) MISC 2 each by Does not apply route daily. 2 each 2   Ferrous Sulfate (IRON  SUPPLEMENT PO) Take 1 tablet by mouth daily.     fluticasone  (FLONASE ) 50 MCG/ACT nasal spray USE 2 SPRAYS IN EACH NOSTRIL EVERY DAY 48 g 3   furosemide  (LASIX ) 20 MG tablet Take 1 tablet (20 mg total) by mouth 2 (two) times daily. (Patient taking differently: Take 20 mg by mouth as needed.) 180 tablet 1   loratadine  (CLARITIN ) 10 MG tablet TAKE 1 TABLET EVERY DAY 90 tablet 3   pravastatin  (PRAVACHOL ) 80 MG tablet Take 1 tablet (80 mg total) by mouth every evening. 90 tablet  1   TRUE METRIX BLOOD GLUCOSE TEST test strip CHECK BLOOD SUGAR FOUR TIMES DAILY 400 strip 3   TRUEplus Lancets 33G MISC TEST BLOOD SUGAR THREE TIMES DAILY AS NEEDED 300 each 3   No current facility-administered medications on file prior to visit.       Assessment/Plan:   Provide patient with contact information for the Medicare and Seniors' [employer] Information Program [facility]) as she is interested in reviewing options for her Medicare prescription plan for next calendar year  From review of [employer] Medicare website, note that [place] is covered as a tier 3 medication with annual deductible of $450 and tier 3 copayment of $47/month or $131/month through preferred mail order pharmacy - Patient states that this copayment is affordable  Diabetes: - Reviewed goal A1c, goal fasting, and goal 2 hour post prandial glucose - Recommend to check glucose, keep log of results and have this record to review at upcoming medical  appointments. Patient to contact provider office sooner if needed for readings outside of established parameters or symptoms - Outreach to [employer] (dispensing pharmacy for [employer] PAP) on behalf of patient today and CPhT Alfonso advises patient's Humalog  Mix prescription was mailed yesterday for 3 month supply; patient should receive today  CPhT also advises patient has 1 further refill remaining on both prescriptions - Collaborate with clinic team to request patient's prescription record be updated to reflect that patient receives both Trulicity  and Humalog  Mix from [facility], rather than [facility] - Patient to follow up with [employer] patient assistance/[facility] as needed for refills of Trulicity  and Humalog  Mix    Hypertension/Atrial Fibrillation/CHF: - Reviewed long term cardiovascular and renal outcomes of uncontrolled blood pressure - Reviewed appropriate blood pressure monitoring technique and reviewed goal blood pressure.  - Recommend to monitor home blood pressure, keep log of results and have this record to review at upcoming medical appointments. Patient to contact provider office sooner if needed for readings outside of established parameters or symptoms   Asthma/COPD/Allergic Rhinitis: - Reviewed appropriate inhaler technique - Patient to follow up with [facility] if does not receive her Wixela (ordered as express shipment) from pharmacy today     Follow Up Plan: Schedule follow up for patient to speak with Clinical Pharmacist Peyton Ferries by telephone on 06/11/2024 at 10:00 AM        Sharyle Sia, PharmD, [facility] [phone]

## 2024-05-02 NOTE — Telephone Encounter (Signed)
 Copied from CRM #[id number]. Topic: Clinical - Medication Question >> May 02, 2024 12:09 PM Amber Reyes wrote: Reason for CRM: patient called stated she recvd a call from [facility] stating they have her order. Patient stated she gets assistance through the [facility] [phone] / [facility] [phone]. Patient is requesting nurse or provider contact them directly to send her order for the Insulin  Lispro Prot & Lispro (HUMALOG  MIX 75/25 KWIKPEN) (75-25) 100 UNIT/ML Kwikpen as she will need her insulin  soon. Please f/u with patient

## 2024-05-04 ENCOUNTER — Other Ambulatory Visit: Payer: Self-pay

## 2024-05-04 MED ORDER — TRULICITY 3 MG/0.5ML ~~LOC~~ SOAJ: 3.0000 mg | SUBCUTANEOUS | 3 refills | Status: DC

## 2024-05-04 NOTE — Patient Instructions (Signed)
 Goals Addressed             This Visit's Progress    Pharmacy Goals       If you need to reach out to patient assistance programs regarding refills or to find out the status of your application, you can do so by calling:  [employer] [phone]  Check your blood pressure twice weekly, and any time you have concerning symptoms like headache, chest pain, dizziness, shortness of breath, or vision changes.   Our goal is less than 130/80.  To appropriately check your blood pressure, make sure you do the following:  1) Avoid caffeine, exercise, or tobacco products for 30 minutes before checking. Empty your bladder. 2) Sit with your back supported in a flat-backed chair. Rest your arm on something flat (arm of the chair, table, etc). 3) Sit still with your feet flat on the floor, resting, for at least 5 minutes.  4) Check your blood pressure. Take 1-2 readings.  5) Write down these readings and bring with you to any provider appointments.  Bring your home blood pressure machine with you to a provider's office for accuracy comparison at least once a year.   Make sure you take your blood pressure medications before you come to any office visit, even if you were asked to fast for labs.    Please feel free to give me a call if needed for any medication related questions or concerns.   Thank you!   Arthur Lash, PharmD, [facility] [phone]

## 2024-05-14 DIAGNOSIS — Z961 Presence of intraocular lens: Secondary | ICD-10-CM | POA: Diagnosis not present

## 2024-05-14 DIAGNOSIS — E119 Type 2 diabetes mellitus without complications: Secondary | ICD-10-CM | POA: Diagnosis not present

## 2024-05-14 DIAGNOSIS — H43813 Vitreous degeneration, bilateral: Secondary | ICD-10-CM | POA: Diagnosis not present

## 2024-05-14 LAB — OPHTHALMOLOGY REPORT-SCANNED

## 2024-06-06 ENCOUNTER — Other Ambulatory Visit: Admitting: Pharmacist

## 2024-06-11 ENCOUNTER — Telehealth: Payer: Self-pay

## 2024-06-11 ENCOUNTER — Other Ambulatory Visit

## 2024-06-11 NOTE — Telephone Encounter (Signed)
 Pt up for renewal of PAP for trulicity  and humalog  from [employer]illy. Pt portion mailed out and provideer portion faxed today

## 2024-06-11 NOTE — Progress Notes (Signed)
 Complex Care Management Care Guide Note  06/11/2024 Name: Amber Reyes MRN: [id number] DOB: [birth year]  Amber Reyes is a 78 y.o. year old female who is a primary care patient of Sowles, Krichna, MD and is actively engaged with the care management team. I reached out to Amber Reyes by phone today to assist with re-scheduling  with the Pharmacist.  Follow up plan: Telephone appointment with complex care management team member scheduled for:  06/25/24 at 11:00 a.m.   Amber Reyes [facility]  [facility], [facility] VBCI Assistant Direct Dial: [phone]  Fax: [phone]

## 2024-06-13 NOTE — Telephone Encounter (Signed)
 Received provider portion Trulicity  [facility] waiting on pt portion.

## 2024-06-25 ENCOUNTER — Other Ambulatory Visit

## 2024-06-25 ENCOUNTER — Telehealth: Payer: Self-pay | Admitting: Cardiovascular Disease

## 2024-06-25 DIAGNOSIS — Z794 Long term (current) use of insulin: Secondary | ICD-10-CM

## 2024-06-25 DIAGNOSIS — N183 Chronic kidney disease, stage 3 unspecified: Secondary | ICD-10-CM

## 2024-06-25 DIAGNOSIS — J4541 Moderate persistent asthma with (acute) exacerbation: Secondary | ICD-10-CM

## 2024-06-25 DIAGNOSIS — E1122 Type 2 diabetes mellitus with diabetic chronic kidney disease: Secondary | ICD-10-CM

## 2024-06-25 MED ORDER — FREESTYLE LIBRE 3 PLUS SENSOR MISC: Status: DC

## 2024-06-25 NOTE — Progress Notes (Signed)
 S:     Reason for visit: ?  Amber Reyes is a 78 y.o. female with a history of diabetes (type 2), who presents today for a follow up diabetes Telephone pharmacotherapy visit.? Pertinent PMH also includes HTN, Afib, aortic atherosclerosis, CKD stage 3b, gout, obesity, HLD.   Care Team: Primary Care Provider: Sowles, Krichna, MD  At last visit with PCP on 04/12/24, A1c was controlled at 7.1%.   Today, patient reports some concerns with cost of her Advair inhaler.   Current diabetes medications include: Trulicity  3 mg weekly, Humalog  75/25 12 units BID Previous diabetes medications include: Farxiga  (hives) Current hypertension medications include: irbesartan  300 mg daily, furosemide  20 mg BID prn, chlorthalidone 12.5 mg daily, diltiazem  240 mg daily  Current hyperlipidemia medications include: pravastatin  80 mg daily   Patient reports adherence to taking all medications as prescribed.   Have you been experiencing any side effects to the medications prescribed? Not at this time, previous GI upset with Trulicity  Do you have any problems obtaining medications due to transportation or finances? no Insurance coverage: [employer]  Current medication access support:  Trulicity  & Humalog  75/25 via [facility]   Patient denies hypoglycemic events.  Reported home fasting blood sugars: 90-115  DM Prevention:  Statin: Taking; moderate intensity.?  ACE/ARB: yes; irbesartan  Last urinary albumin/creatinine ratio:  Lab Results  Component Value Date   MICRALBCREAT 1.107 10/10/2023   MICRALBCREAT 412 (H) 08/25/2022   MICRALBCREAT 560 (H) 07/14/2021   MICRALBCREAT 78 (H) 05/14/2019   MICRALBCREAT 45 (H) 04/25/2018   MICRALBCREAT 30 (H) 12/23/2016   Last eye exam:  Lab Results  Component Value Date   HMDIABEYEEXA No Retinopathy 05/13/2023   Lab Results  Component Value Date   HMDIABEYEEXA No Retinopathy 05/13/2023   Last foot exam: No foot exam found Tobacco Use:  Tobacco Use: Low  Risk  (04/20/2024)   Patient History    Smoking Tobacco Use: Never    Smokeless Tobacco Use: Never    Passive Exposure: Never   O:   Vitals:  Wt Readings from Last 3 Encounters:  04/20/24 (!) 349 lb (158.3 kg)  04/17/24 (!) 343 lb (155.6 kg)  04/12/24 (!) 345 lb (156.5 kg)   BP Readings from Last 3 Encounters:  04/20/24 (!) 145/52  04/17/24 (!) 150/80  04/12/24 126/68   Pulse Readings from Last 3 Encounters:  04/20/24 73  04/17/24 76  04/12/24 69     Labs:?  Lab Results  Component Value Date   HGBA1C 7.1 (H) 04/12/2024   HGBA1C 7.1 (A) 10/25/2023   HGBA1C 7.2 (A) 06/24/2023   GLUCOSE 130 (H) 04/12/2024   MICRALBCREAT 1.107 10/10/2023   MICRALBCREAT 412 (H) 08/25/2022   MICRALBCREAT 560 (H) 07/14/2021   CREATININE 1.48 (H) 04/12/2024   CREATININE 1.59 (H) 12/25/2023   CREATININE 1.24 (H) 10/25/2023    Lab Results  Component Value Date   CHOL 112 10/25/2023   LDLCALC 46 10/25/2023   LDLCALC 70 08/25/2022   LDLCALC 67 07/14/2021   HDL 52 10/25/2023   TRIG 67 10/25/2023   TRIG 102 08/25/2022   TRIG 78 07/14/2021   ALT 6 04/12/2024   ALT 6 10/25/2023   AST 15 04/12/2024   AST 14 10/25/2023      Chemistry      Component Value Date/Time   NA 137 04/12/2024 0849   NA 132 (L) 12/25/2023 1153   NA 138 11/09/2013 1137   K 5.3 04/12/2024 0849   K 4.0 11/09/2013 1137  CL 104 04/12/2024 0849   CL 108 (H) 11/09/2013 1137   CO2 23 04/12/2024 0849   CO2 23 11/09/2013 1137   BUN 37 (H) 04/12/2024 0849   BUN 32 (H) 12/25/2023 1153   BUN 23 (H) 11/09/2013 1137   CREATININE 1.48 (H) 04/12/2024 0849      Component Value Date/Time   CALCIUM  9.2 04/12/2024 0849   CALCIUM  8.5 11/09/2013 1137   ALKPHOS 90 12/23/2016 0941   AST 15 04/12/2024 0849   ALT 6 04/12/2024 0849   BILITOT 0.4 04/12/2024 0849   BILITOT 0.4 12/18/2015 0911       The ASCVD Risk score (Arnett DK, et al., 2019) failed to calculate for the following reasons:   The valid total cholesterol  range is 130 to 320 mg/dL  Lab Results  Component Value Date   MICRALBCREAT 1.107 10/10/2023   MICRALBCREAT 412 (H) 08/25/2022   MICRALBCREAT 560 (H) 07/14/2021   MICRALBCREAT 78 (H) 05/14/2019   MICRALBCREAT 45 (H) 04/25/2018   MICRALBCREAT 30 (H) 12/23/2016    A/P: Diabetes currently controlled with a most recent A1c of 7.1% on 04/12/24 (goal <8% given age and comorbidities). Patient is able to verbalize appropriate hypoglycemia management plan. Patient denies any hypoglycemia, however, concern with use of mixed insulin  product given age, kidney function, and frequency of BG testing (once daily). Medication adherence appears appropriate. Patient agreeable to CGM to assist with BG monitoring. Reports she assists her husband with his CGM. Discussed switching from mixed to basal insulin  product, however, patient would like to use the CGM device for a month and then determine if switch would be best. Will address at follow up.  -Continued Humalog  75/25 mg  12 units BID -Continued GLP-1 Trulicity  (dulaglutide ) 3 mg weekly .  -Begin monitoring BG via Freestyle Libre 3+ CGM. Prescription set via [employer] -Patient educated on purpose, proper use, and potential adverse effects of insulin .  -Extensively discussed pathophysiology of diabetes, recommended lifestyle interventions, dietary effects on blood sugar control.  -Counseled on s/sx of and management of hypoglycemia.   ASCVD risk - secondary prevention in patient with diabetes. Last LDL is 46 mg/dL, at goal of <44 mg/dL. -Continued pravastatin  80 mg daily.   Asthma: - Currently controlled. No reported symptoms at this time.   - Reviewed appropriate inhaler technique. - Patient reports issues with cost of Advair inhaler on insurance. Meets financial criteria for [employer]  patient assistance program through [employer]. Will collaborate with provider, CPhT, and patient to pursue assistance.     Patient verbalized understanding of treatment plan.  Total time patient counseling 45 minutes.  Follow-up:  Pharmacist on 08/06/24 PCP clinic visit on 08/29/24  Peyton CHARLENA Ferries, PharmD, CPP Clinical Pharmacist [facility] [phone]

## 2024-06-25 NOTE — Telephone Encounter (Signed)
 Pt c/o medication issue:  1. Name of Medication:  apixaban  (ELIQUIS ) 5 MG TABS tablet  2. How are you currently taking this medication (dosage and times per day)? As written   3. Are you having a reaction (difficulty breathing--STAT)? No   4. What is your medication issue? Pt asked if she can have her patient assistance renewed for this med.

## 2024-06-27 ENCOUNTER — Other Ambulatory Visit (HOSPITAL_COMMUNITY): Payer: Self-pay

## 2024-06-27 NOTE — Telephone Encounter (Signed)
 Received pt portion [facility] Trulicity  and proof of income, faxing provider portion today.

## 2024-06-28 ENCOUNTER — Telehealth: Payer: Self-pay

## 2024-06-28 ENCOUNTER — Other Ambulatory Visit (HOSPITAL_COMMUNITY): Payer: Self-pay

## 2024-06-28 NOTE — Telephone Encounter (Signed)
 Received provider portion today [facility] Trulicity ,faxed to [facility] today along pt portion and proof of income.

## 2024-06-29 MED ORDER — TRULICITY 3 MG/0.5ML ~~LOC~~ SOAJ: 3.0000 mg | SUBCUTANEOUS | 3 refills | Status: AC

## 2024-06-29 MED ORDER — FLUTICASONE FUROATE-VILANTEROL 100-25 MCG/ACT IN AEPB: 1.0000 | INHALATION_SPRAY | Freq: Every day | RESPIRATORY_TRACT | Status: DC

## 2024-06-30 ENCOUNTER — Other Ambulatory Visit: Payer: Self-pay | Admitting: Internal Medicine

## 2024-06-30 DIAGNOSIS — J4540 Moderate persistent asthma, uncomplicated: Secondary | ICD-10-CM

## 2024-07-02 ENCOUNTER — Other Ambulatory Visit (HOSPITAL_COMMUNITY): Payer: Self-pay

## 2024-07-02 ENCOUNTER — Encounter: Payer: Self-pay | Admitting: Oncology

## 2024-07-02 NOTE — Telephone Encounter (Signed)
 Requested Prescriptions  Pending Prescriptions Disp Refills   loratadine  (CLARITIN ) 10 MG tablet [Pharmacy Med Name: ALLERGY RELF (LORATADINE ) 10MG  TABS] 30 tablet 3    Sig: TAKE 1 TABLET(10 MG) BY MOUTH DAILY     Ear, Nose, and Throat:  Antihistamines 2 Failed - 07/02/2024  3:48 PM      Failed - Cr in normal range and within 360 days    Creat  Date Value Ref Range Status  04/12/2024 1.48 (H) 0.60 - 1.00 mg/dL Final   Creatinine, Urine  Date Value Ref Range Status  08/25/2022 68 20 - 275 mg/dL Final         Passed - Valid encounter within last 12 months    Recent Outpatient Visits           2 months ago Controlled type 2 diabetes mellitus with stage 3 chronic kidney disease, with long-term current use of insulin  [facility])   [facility] Glenard Mire, MD   5 months ago Community acquired pneumonia of right lower lobe of lung   [facility] Dennis, Mire, MD   6 months ago Community acquired pneumonia of right lower lobe of lung   [facility] Tillar, Mire, MD   8 months ago Controlled type 2 diabetes mellitus with microalbuminuria, with long-term current use of insulin  [facility])   [facility] Glenard Mire, MD       Future Appointments             In 1 month Glenard, Krichna, MD [facility] [place]

## 2024-07-02 NOTE — Telephone Encounter (Signed)
 Printed and put in mail to be sent out tomorrow. Thanks.

## 2024-07-08 DIAGNOSIS — M25511 Pain in right shoulder: Secondary | ICD-10-CM | POA: Diagnosis not present

## 2024-07-08 DIAGNOSIS — M25512 Pain in left shoulder: Secondary | ICD-10-CM | POA: Diagnosis not present

## 2024-07-08 DIAGNOSIS — E119 Type 2 diabetes mellitus without complications: Secondary | ICD-10-CM | POA: Diagnosis not present

## 2024-07-17 ENCOUNTER — Encounter: Payer: Self-pay | Admitting: General Surgery

## 2024-07-17 ENCOUNTER — Ambulatory Visit (INDEPENDENT_AMBULATORY_CARE_PROVIDER_SITE_OTHER): Admitting: General Surgery

## 2024-07-17 VITALS — BP 170/75 | HR 70 | Ht 70.0 in | Wt 336.0 lb

## 2024-07-17 DIAGNOSIS — K429 Umbilical hernia without obstruction or gangrene: Secondary | ICD-10-CM

## 2024-07-17 NOTE — Progress Notes (Signed)
 Outpatient Surgical Follow Up CC: Umbilical Hernia 07/17/2024  Amber Reyes is an 78 y.o. female.   Chief Complaint  Patient presents with   Follow-up    HPI: Amber Reyes returns today.  She was being followed by my partner for umbilical hernia.  She unfortunately right now her weight does not allow her to safely undergo elective umbilical hernia repair.  She has been using Trulicity  and continues to lose weight.  She reports overall doing well.  She says that she has less pain over her umbilical hernia.  She only has pain when she has to lift her grandbaby.  She denies any obstructive symptoms and denies any overlying skin changes  Past Medical History:  Diagnosis Date   Allergic rhinitis, cause unspecified    Anxiety    Chronic diastolic CHF (congestive heart failure) (HCC)    CKD (chronic kidney disease), stage III (HCC)    Edema    Essential hypertension, benign    Gout, unspecified    Heart murmur    as child   Hyperlipidemia    Lipoma of unspecified site    Other and unspecified disc disorder of lumbar region    Other general symptoms(780.99)    PAC (premature atrial contraction)    Renal insufficiency    Restless legs syndrome (RLS)    Sebaceous cyst    Super obesity    Type II or unspecified type diabetes mellitus without mention of complication, uncontrolled    Unspecified asthma(493.90)    Unspecified disease of pericardium    Unspecified sleep apnea    Unspecified vitamin D  deficiency    Vaginitis and vulvovaginitis, unspecified    Venous insufficiency     Past Surgical History:  Procedure Laterality Date   CARDIAC CATHETERIZATION  2002   [facility]   CATARACT EXTRACTION W/PHACO Right 09/15/2021   Procedure: CATARACT EXTRACTION PHACO AND INTRAOCULAR LENS PLACEMENT (IOC) RIGHT 10.19 01:01.8;  Surgeon: Jaye Fallow, MD;  Location: [facility];  Service: Ophthalmology;  Laterality: Right;   CATARACT EXTRACTION W/PHACO Left 09/29/2021   Procedure:  CATARACT EXTRACTION PHACO AND INTRAOCULAR LENS PLACEMENT (IOC) LEFT DIABETIC 11.40 01:21.2;  Surgeon: Jaye Fallow, MD;  Location: [facility];  Service: Ophthalmology;  Laterality: Left;   COLONOSCOPY WITH PROPOFOL  N/A 09/25/2018   Procedure: COLONOSCOPY WITH PROPOFOL ;  Surgeon: Therisa Bi, MD;  Location: [facility] ENDOSCOPY;  Service: Gastroenterology;  Laterality: N/A;   COLONOSCOPY WITH PROPOFOL  N/A 04/13/2019   Procedure: COLONOSCOPY WITH PROPOFOL ;  Surgeon: Therisa Bi, MD;  Location: [facility] ENDOSCOPY;  Service: Gastroenterology;  Laterality: N/A;   COLONOSCOPY WITH PROPOFOL  N/A 06/27/2020   Procedure: COLONOSCOPY WITH PROPOFOL ;  Surgeon: Therisa Bi, MD;  Location: [facility] ENDOSCOPY;  Service: Gastroenterology;  Laterality: N/A;   COLONOSCOPY WITH PROPOFOL  N/A 08/02/2023   Procedure: COLONOSCOPY WITH PROPOFOL ;  Surgeon: Therisa Bi, MD;  Location: [facility] ENDOSCOPY;  Service: Gastroenterology;  Laterality: N/A;   PERICARDIUM SURGERY     POLYPECTOMY  08/02/2023   Procedure: POLYPECTOMY;  Surgeon: Therisa Bi, MD;  Location: [facility] ENDOSCOPY;  Service: Gastroenterology;;    Family History  Problem Relation Age of Onset   Heart attack Mother    Hypertension Mother    Bladder Cancer Father    Breast cancer Neg Hx     Social History:  reports that she has never smoked. She has never been exposed to tobacco smoke. She has never used smokeless tobacco. She reports that she does not drink alcohol and does not use drugs.  Allergies:  Allergies  Allergen Reactions   Farxiga  [Dapagliflozin ] Hives    Medications reviewed.    ROS Full ROS performed and is otherwise negative other than what is stated in HPI   BP (!) 170/75   Pulse 70   Ht 5' 10 (1.778 m)   Wt (!) 336 lb (152.4 kg)   SpO2 98%   BMI 48.21 kg/m   Physical Exam  Pleasant young lady, obese, partially reducible umbilical hernia without any overlying skin changes  CT scans consistent with a fat-containing umbilical  hernia.  Assessment/Plan:  78 year old lady with fat-containing umbilical hernia.  She has been losing weight but her BMI is still precluding her from elective repair.  I encouraged her to continue her weight loss journey as she is improving greatly in that department.  Will plan to see her again in 6 months.  Discussed warning signs of hernia.  A total of 20 minutes was spent reviewing the patient's chart, performing history and physical discussing treatment options with the patient Jayson Endow, M.D. [facility]

## 2024-07-17 NOTE — Patient Instructions (Addendum)
 You may try using an abdominal binder. You can get this on line or at a medical supply store like [facility]  Follow-up with our office in 6 months. We will send you a letter about this appointment.    Please call and ask to speak with a nurse if you develop questions or concerns.

## 2024-07-21 ENCOUNTER — Emergency Department: Admitting: Anesthesiology

## 2024-07-21 ENCOUNTER — Emergency Department

## 2024-07-21 ENCOUNTER — Inpatient Hospital Stay
Admission: EM | Admit: 2024-07-21 | Discharge: 2024-07-26 | DRG: 354 | Disposition: A | Discharge status: Home Health | Attending: General Surgery | Admitting: General Surgery

## 2024-07-21 ENCOUNTER — Other Ambulatory Visit: Payer: Self-pay

## 2024-07-21 ENCOUNTER — Encounter
Admission: EM | Disposition: A | Discharge status: Home Health | Payer: Self-pay | Source: Home / Self Care | Attending: General Surgery

## 2024-07-21 DIAGNOSIS — I130 Hypertensive heart and chronic kidney disease with heart failure and stage 1 through stage 4 chronic kidney disease, or unspecified chronic kidney disease: Secondary | ICD-10-CM | POA: Diagnosis not present

## 2024-07-21 DIAGNOSIS — I480 Paroxysmal atrial fibrillation: Secondary | ICD-10-CM | POA: Diagnosis present

## 2024-07-21 DIAGNOSIS — Z7985 Long-term (current) use of injectable non-insulin antidiabetic drugs: Secondary | ICD-10-CM

## 2024-07-21 DIAGNOSIS — K429 Umbilical hernia without obstruction or gangrene: Secondary | ICD-10-CM | POA: Diagnosis not present

## 2024-07-21 DIAGNOSIS — I4891 Unspecified atrial fibrillation: Secondary | ICD-10-CM | POA: Diagnosis not present

## 2024-07-21 DIAGNOSIS — I5032 Chronic diastolic (congestive) heart failure: Secondary | ICD-10-CM | POA: Diagnosis not present

## 2024-07-21 DIAGNOSIS — N183 Chronic kidney disease, stage 3 unspecified: Secondary | ICD-10-CM | POA: Diagnosis not present

## 2024-07-21 DIAGNOSIS — E1129 Type 2 diabetes mellitus with other diabetic kidney complication: Secondary | ICD-10-CM

## 2024-07-21 DIAGNOSIS — E875 Hyperkalemia: Secondary | ICD-10-CM | POA: Diagnosis present

## 2024-07-21 DIAGNOSIS — G4733 Obstructive sleep apnea (adult) (pediatric): Secondary | ICD-10-CM | POA: Diagnosis present

## 2024-07-21 DIAGNOSIS — Z8052 Family history of malignant neoplasm of bladder: Secondary | ICD-10-CM

## 2024-07-21 DIAGNOSIS — E871 Hypo-osmolality and hyponatremia: Secondary | ICD-10-CM | POA: Diagnosis present

## 2024-07-21 DIAGNOSIS — K436 Other and unspecified ventral hernia with obstruction, without gangrene: Secondary | ICD-10-CM | POA: Diagnosis not present

## 2024-07-21 DIAGNOSIS — I10 Essential (primary) hypertension: Secondary | ICD-10-CM | POA: Diagnosis not present

## 2024-07-21 DIAGNOSIS — N1832 Chronic kidney disease, stage 3b: Secondary | ICD-10-CM | POA: Diagnosis present

## 2024-07-21 DIAGNOSIS — G2581 Restless legs syndrome: Secondary | ICD-10-CM | POA: Diagnosis present

## 2024-07-21 DIAGNOSIS — K56600 Partial intestinal obstruction, unspecified as to cause: Principal | ICD-10-CM

## 2024-07-21 DIAGNOSIS — N179 Acute kidney failure, unspecified: Secondary | ICD-10-CM | POA: Diagnosis present

## 2024-07-21 DIAGNOSIS — K56609 Unspecified intestinal obstruction, unspecified as to partial versus complete obstruction: Secondary | ICD-10-CM | POA: Diagnosis present

## 2024-07-21 DIAGNOSIS — K430 Incisional hernia with obstruction, without gangrene: Secondary | ICD-10-CM | POA: Diagnosis not present

## 2024-07-21 DIAGNOSIS — Z6841 Body Mass Index (BMI) 40.0 and over, adult: Secondary | ICD-10-CM | POA: Diagnosis not present

## 2024-07-21 DIAGNOSIS — Z79899 Other long term (current) drug therapy: Secondary | ICD-10-CM

## 2024-07-21 DIAGNOSIS — E785 Hyperlipidemia, unspecified: Secondary | ICD-10-CM | POA: Diagnosis present

## 2024-07-21 DIAGNOSIS — Z7901 Long term (current) use of anticoagulants: Secondary | ICD-10-CM | POA: Diagnosis not present

## 2024-07-21 DIAGNOSIS — Z794 Long term (current) use of insulin: Secondary | ICD-10-CM

## 2024-07-21 DIAGNOSIS — Z8249 Family history of ischemic heart disease and other diseases of the circulatory system: Secondary | ICD-10-CM

## 2024-07-21 DIAGNOSIS — M109 Gout, unspecified: Secondary | ICD-10-CM | POA: Diagnosis present

## 2024-07-21 DIAGNOSIS — T465X5A Adverse effect of other antihypertensive drugs, initial encounter: Secondary | ICD-10-CM | POA: Diagnosis present

## 2024-07-21 DIAGNOSIS — D631 Anemia in chronic kidney disease: Secondary | ICD-10-CM | POA: Diagnosis not present

## 2024-07-21 DIAGNOSIS — E1122 Type 2 diabetes mellitus with diabetic chronic kidney disease: Secondary | ICD-10-CM | POA: Diagnosis present

## 2024-07-21 DIAGNOSIS — E1165 Type 2 diabetes mellitus with hyperglycemia: Secondary | ICD-10-CM | POA: Diagnosis present

## 2024-07-21 DIAGNOSIS — I5030 Unspecified diastolic (congestive) heart failure: Secondary | ICD-10-CM | POA: Insufficient documentation

## 2024-07-21 DIAGNOSIS — E559 Vitamin D deficiency, unspecified: Secondary | ICD-10-CM | POA: Diagnosis present

## 2024-07-21 DIAGNOSIS — F419 Anxiety disorder, unspecified: Secondary | ICD-10-CM | POA: Diagnosis present

## 2024-07-21 DIAGNOSIS — E66813 Obesity, class 3: Secondary | ICD-10-CM | POA: Diagnosis present

## 2024-07-21 DIAGNOSIS — D72829 Elevated white blood cell count, unspecified: Secondary | ICD-10-CM | POA: Diagnosis present

## 2024-07-21 DIAGNOSIS — N281 Cyst of kidney, acquired: Secondary | ICD-10-CM | POA: Diagnosis not present

## 2024-07-21 DIAGNOSIS — K8020 Calculus of gallbladder without cholecystitis without obstruction: Secondary | ICD-10-CM | POA: Diagnosis not present

## 2024-07-21 DIAGNOSIS — Z7951 Long term (current) use of inhaled steroids: Secondary | ICD-10-CM

## 2024-07-21 HISTORY — PX: VENTRAL HERNIA REPAIR: SHX424

## 2024-07-21 LAB — URINALYSIS, ROUTINE W REFLEX MICROSCOPIC
Bilirubin Urine: NEGATIVE
Glucose, UA: NEGATIVE mg/dL
Ketones, ur: NEGATIVE mg/dL
Leukocytes,Ua: NEGATIVE
Nitrite: NEGATIVE
Protein, ur: >=300 mg/dL — AB
Specific Gravity, Urine: 1.02 (ref 1.005–1.030)
pH: 5 (ref 5.0–8.0)

## 2024-07-21 LAB — BASIC METABOLIC PANEL WITH GFR
Anion gap: 11 (ref 5–15)
BUN: 35 mg/dL — ABNORMAL HIGH (ref 8–23)
CO2: 22 mmol/L (ref 22–32)
Calcium: 9.4 mg/dL (ref 8.9–10.3)
Chloride: 101 mmol/L (ref 98–111)
Creatinine, Ser: 1.3 mg/dL — ABNORMAL HIGH (ref 0.44–1.00)
GFR, Estimated: 42 mL/min — ABNORMAL LOW (ref >60)
Glucose, Bld: 203 mg/dL — ABNORMAL HIGH (ref 70–99)
Potassium: 5.5 mmol/L — ABNORMAL HIGH (ref 3.5–5.1)
Sodium: 134 mmol/L — ABNORMAL LOW (ref 135–145)

## 2024-07-21 LAB — CBC
HCT: 34.8 % — ABNORMAL LOW (ref 36.0–46.0)
Hemoglobin: 11.3 g/dL — ABNORMAL LOW (ref 12.0–15.0)
MCH: 29.4 pg (ref 26.0–34.0)
MCHC: 32.5 g/dL (ref 30.0–36.0)
MCV: 90.4 fL (ref 80.0–100.0)
Platelets: 240 K/uL (ref 150–400)
RBC: 3.85 MIL/uL — ABNORMAL LOW (ref 3.87–5.11)
RDW: 15.4 % (ref 11.5–15.5)
WBC: 15.2 K/uL — ABNORMAL HIGH (ref 4.0–10.5)
nRBC: 0 % (ref 0.0–0.2)

## 2024-07-21 LAB — LIPASE, BLOOD: Lipase: 24 U/L (ref 11–51)

## 2024-07-21 LAB — GLUCOSE, CAPILLARY
Glucose-Capillary: 153 mg/dL — ABNORMAL HIGH (ref 70–99)
Glucose-Capillary: 184 mg/dL — ABNORMAL HIGH (ref 70–99)

## 2024-07-21 LAB — RESP PANEL BY RT-PCR (RSV, FLU A&B, COVID)  RVPGX2
Influenza A by PCR: NEGATIVE
Influenza B by PCR: NEGATIVE
Resp Syncytial Virus by PCR: NEGATIVE
SARS Coronavirus 2 by RT PCR: NEGATIVE

## 2024-07-21 SURGERY — REPAIR, HERNIA, VENTRAL
Anesthesia: General | Site: Abdomen

## 2024-07-21 MED ORDER — OXYCODONE HCL 5 MG PO TABS: 5.0000 mg | ORAL_TABLET | Freq: Once | ORAL | Status: DC | PRN

## 2024-07-21 MED ORDER — PROPOFOL 10 MG/ML IV BOLUS
INTRAVENOUS | Status: AC
  Filled 2024-07-21: qty 20

## 2024-07-21 MED ORDER — ONDANSETRON HCL 4 MG/2ML IJ SOLN
4.0000 mg | Freq: Once | INTRAMUSCULAR | Status: AC
Start: 2024-07-21 — End: 2024-07-21
  Administered 2024-07-21: 4 mg via INTRAVENOUS
  Filled 2024-07-21: qty 2

## 2024-07-21 MED ORDER — OXYCODONE HCL 5 MG PO TABS
10.0000 mg | ORAL_TABLET | ORAL | Status: DC | PRN
  Administered 2024-07-22 – 2024-07-24 (×7): 10 mg via ORAL
  Filled 2024-07-21 (×7): qty 2

## 2024-07-21 MED ORDER — ONDANSETRON HCL 4 MG/2ML IJ SOLN
INTRAMUSCULAR | Status: DC | PRN
  Administered 2024-07-21: 4 mg via INTRAVENOUS

## 2024-07-21 MED ORDER — SUCCINYLCHOLINE CHLORIDE 200 MG/10ML IV SOSY
PREFILLED_SYRINGE | INTRAVENOUS | Status: DC | PRN
  Administered 2024-07-21: 100 mg via INTRAVENOUS

## 2024-07-21 MED ORDER — LIDOCAINE HCL (CARDIAC) PF 100 MG/5ML IV SOSY
PREFILLED_SYRINGE | INTRAVENOUS | Status: DC | PRN
  Administered 2024-07-21: 100 mg via INTRAVENOUS

## 2024-07-21 MED ORDER — IOHEXOL 300 MG/ML  SOLN
80.0000 mL | Freq: Once | INTRAMUSCULAR | Status: AC | PRN
  Administered 2024-07-21: 80 mL via INTRAVENOUS

## 2024-07-21 MED ORDER — FLUTICASONE PROPIONATE 50 MCG/ACT NA SUSP
2.0000 | Freq: Every day | NASAL | Status: DC
  Administered 2024-07-22 – 2024-07-26 (×5): 2 via NASAL
  Filled 2024-07-21: qty 16

## 2024-07-21 MED ORDER — FENTANYL CITRATE (PF) 100 MCG/2ML IJ SOLN: 25.0000 ug | INTRAMUSCULAR | Status: DC | PRN

## 2024-07-21 MED ORDER — CHLORTHALIDONE 25 MG PO TABS
12.5000 mg | ORAL_TABLET | Freq: Every day | ORAL | Status: DC
  Administered 2024-07-22: 12.5 mg via ORAL
  Filled 2024-07-21 (×2): qty 0.5

## 2024-07-21 MED ORDER — BUPIVACAINE-EPINEPHRINE (PF) 0.5% -1:200000 IJ SOLN
INTRAMUSCULAR | Status: AC
Start: 2024-07-21 — End: 2024-07-21
  Filled 2024-07-21: qty 30

## 2024-07-21 MED ORDER — PROPOFOL 10 MG/ML IV BOLUS
INTRAVENOUS | Status: DC | PRN
  Administered 2024-07-21: 200 mg via INTRAVENOUS

## 2024-07-21 MED ORDER — 0.9 % SODIUM CHLORIDE (POUR BTL) OPTIME
TOPICAL | Status: DC | PRN
  Administered 2024-07-21: 500 mL

## 2024-07-21 MED ORDER — ALLOPURINOL 100 MG PO TABS
100.0000 mg | ORAL_TABLET | Freq: Two times a day (BID) | ORAL | Status: DC
  Administered 2024-07-22 – 2024-07-26 (×9): 100 mg via ORAL
  Filled 2024-07-21 (×9): qty 1

## 2024-07-21 MED ORDER — SODIUM CHLORIDE 0.9 % IV SOLN: INTRAVENOUS | Status: DC | PRN

## 2024-07-21 MED ORDER — DEXAMETHASONE SOD PHOSPHATE PF 10 MG/ML IJ SOLN
INTRAMUSCULAR | Status: DC | PRN
  Administered 2024-07-21: 10 mg via INTRAVENOUS

## 2024-07-21 MED ORDER — DILTIAZEM HCL ER COATED BEADS 120 MG PO CP24
240.0000 mg | ORAL_CAPSULE | Freq: Every day | ORAL | Status: DC
  Administered 2024-07-22 – 2024-07-26 (×5): 240 mg via ORAL
  Filled 2024-07-21 (×5): qty 2

## 2024-07-21 MED ORDER — BUPIVACAINE-EPINEPHRINE (PF) 0.5% -1:200000 IJ SOLN
INTRAMUSCULAR | Status: DC | PRN
Start: 2024-07-21 — End: 2024-07-21
  Administered 2024-07-21: 30 mL

## 2024-07-21 MED ORDER — IRBESARTAN 150 MG PO TABS
300.0000 mg | ORAL_TABLET | Freq: Every day | ORAL | Status: DC
  Administered 2024-07-22: 300 mg via ORAL
  Filled 2024-07-21: qty 2

## 2024-07-21 MED ORDER — ACETAMINOPHEN 10 MG/ML IV SOLN
INTRAVENOUS | Status: AC
  Filled 2024-07-21: qty 100

## 2024-07-21 MED ORDER — GLYCOPYRROLATE 0.2 MG/ML IJ SOLN
INTRAMUSCULAR | Status: DC | PRN
  Administered 2024-07-21: .2 mg via INTRAVENOUS

## 2024-07-21 MED ORDER — INSULIN ASPART 100 UNIT/ML IJ SOLN
0.0000 [IU] | Freq: Three times a day (TID) | INTRAMUSCULAR | Status: DC
  Administered 2024-07-22: 3 [IU] via SUBCUTANEOUS
  Administered 2024-07-22 (×2): 5 [IU] via SUBCUTANEOUS
  Administered 2024-07-23: 3 [IU] via SUBCUTANEOUS
  Administered 2024-07-23: 5 [IU] via SUBCUTANEOUS
  Administered 2024-07-23: 3 [IU] via SUBCUTANEOUS
  Administered 2024-07-24: 2 [IU] via SUBCUTANEOUS
  Administered 2024-07-24 – 2024-07-26 (×6): 3 [IU] via SUBCUTANEOUS
  Administered 2024-07-26: 5 [IU] via SUBCUTANEOUS
  Filled 2024-07-21 (×3): qty 3
  Filled 2024-07-21 (×2): qty 5
  Filled 2024-07-21 (×2): qty 3
  Filled 2024-07-21 (×2): qty 5
  Filled 2024-07-21 (×5): qty 3

## 2024-07-21 MED ORDER — HYDROMORPHONE HCL 1 MG/ML IJ SOLN
0.5000 mg | INTRAMUSCULAR | Status: DC | PRN
  Administered 2024-07-22: 0.5 mg via INTRAVENOUS
  Filled 2024-07-21: qty 0.5

## 2024-07-21 MED ORDER — HEPARIN SODIUM (PORCINE) 5000 UNIT/ML IJ SOLN
5000.0000 [IU] | Freq: Three times a day (TID) | INTRAMUSCULAR | Status: DC
  Administered 2024-07-22 – 2024-07-24 (×7): 5000 [IU] via SUBCUTANEOUS
  Filled 2024-07-21 (×7): qty 1

## 2024-07-21 MED ORDER — CEFAZOLIN SODIUM-DEXTROSE 3-4 GM/150ML-% IV SOLN
3.0000 g | Freq: Once | INTRAVENOUS | Status: AC
  Administered 2024-07-21: 3 g via INTRAVENOUS
  Filled 2024-07-21 (×2): qty 150

## 2024-07-21 MED ORDER — SUGAMMADEX SODIUM 200 MG/2ML IV SOLN
INTRAVENOUS | Status: DC | PRN
  Administered 2024-07-21: 200 mg via INTRAVENOUS

## 2024-07-21 MED ORDER — HYDROMORPHONE HCL 1 MG/ML IJ SOLN
INTRAMUSCULAR | Status: DC | PRN
  Administered 2024-07-21 (×2): .5 mg via INTRAVENOUS

## 2024-07-21 MED ORDER — HYDROMORPHONE HCL 1 MG/ML IJ SOLN
INTRAMUSCULAR | Status: AC
  Filled 2024-07-21: qty 1

## 2024-07-21 MED ORDER — OXYCODONE HCL 5 MG/5ML PO SOLN: 5.0000 mg | Freq: Once | ORAL | Status: DC | PRN

## 2024-07-21 MED ORDER — OXYCODONE HCL 5 MG PO TABS
5.0000 mg | ORAL_TABLET | ORAL | Status: DC | PRN
  Administered 2024-07-24 – 2024-07-26 (×5): 5 mg via ORAL
  Filled 2024-07-21 (×5): qty 1

## 2024-07-21 MED ORDER — FENTANYL CITRATE (PF) 100 MCG/2ML IJ SOLN
INTRAMUSCULAR | Status: AC
  Filled 2024-07-21: qty 2

## 2024-07-21 MED ORDER — LACTATED RINGERS IV SOLN: INTRAVENOUS | Status: AC

## 2024-07-21 MED ORDER — SODIUM CHLORIDE 0.9 % IV BOLUS
1000.0000 mL | Freq: Once | INTRAVENOUS | Status: AC
Start: 2024-07-21 — End: 2024-07-21
  Administered 2024-07-21: 1000 mL via INTRAVENOUS

## 2024-07-21 MED ORDER — ACETAMINOPHEN 500 MG PO TABS
1000.0000 mg | ORAL_TABLET | Freq: Three times a day (TID) | ORAL | Status: DC
  Administered 2024-07-22 – 2024-07-26 (×11): 1000 mg via ORAL
  Filled 2024-07-21 (×11): qty 2

## 2024-07-21 MED ORDER — ONDANSETRON HCL 4 MG/2ML IJ SOLN: 4.0000 mg | INTRAMUSCULAR | Status: DC | PRN

## 2024-07-21 MED ORDER — FENTANYL CITRATE (PF) 100 MCG/2ML IJ SOLN
INTRAMUSCULAR | Status: DC | PRN
  Administered 2024-07-21 (×2): 50 ug via INTRAVENOUS

## 2024-07-21 MED ORDER — CEFAZOLIN SODIUM-DEXTROSE 3-4 GM/150ML-% IV SOLN
3.0000 g | Freq: Three times a day (TID) | INTRAVENOUS | Status: AC
  Administered 2024-07-22 (×3): 3 g via INTRAVENOUS
  Filled 2024-07-21 (×3): qty 150

## 2024-07-21 MED ORDER — ALBUTEROL-BUDESONIDE 90-80 MCG/ACT IN AERO: 2.0000 | INHALATION_SPRAY | Freq: Four times a day (QID) | RESPIRATORY_TRACT | Status: DC | PRN

## 2024-07-21 MED ORDER — ROCURONIUM BROMIDE 100 MG/10ML IV SOLN
INTRAVENOUS | Status: DC | PRN
  Administered 2024-07-21: 50 mg via INTRAVENOUS
  Administered 2024-07-21: 20 mg via INTRAVENOUS

## 2024-07-21 MED ORDER — ACETAMINOPHEN 10 MG/ML IV SOLN
INTRAVENOUS | Status: DC | PRN
  Administered 2024-07-21: 1000 mg via INTRAVENOUS

## 2024-07-21 SURGICAL SUPPLY — 47 items
BINDER ABD UNIV 12 45-62 (WOUND CARE) IMPLANT
BLADE SURG 15 STRL LF DISP TIS (BLADE) ×2 IMPLANT
BLADE SURG SZ10 CARB STEEL (BLADE) IMPLANT
CANISTER WOUND CARE 500ML ATS (WOUND CARE) IMPLANT
CHLORAPREP W/TINT 26 (MISCELLANEOUS) IMPLANT
DERMABOND ADVANCED .7 DNX12 (GAUZE/BANDAGES/DRESSINGS) IMPLANT
DRAPE LAPAROTOMY 100X77 ABD (DRAPES) ×2 IMPLANT
DRAPE WARM FLUID 44X44 (DRAPES) ×2 IMPLANT
DRSG OPSITE POSTOP 4X10 (GAUZE/BANDAGES/DRESSINGS) IMPLANT
DRSG OPSITE POSTOP 4X8 (GAUZE/BANDAGES/DRESSINGS) IMPLANT
ELECT BLADE 6.5 EXT (BLADE) ×2 IMPLANT
ELECTRODE REM PT RTRN 9FT ADLT (ELECTROSURGICAL) ×2 IMPLANT
GLOVE BIOGEL PI IND STRL 7.5 (GLOVE) ×2 IMPLANT
GLOVE SURG SYN 7.0 PF PI (GLOVE) ×2 IMPLANT
GOWN STRL REUS W/ TWL LRG LVL3 (GOWN DISPOSABLE) ×4 IMPLANT
KIT PREVENA INCISION MGT 13 (CANNISTER) IMPLANT
KIT TURNOVER KIT A (KITS) ×2 IMPLANT
LABEL OR SOLS (LABEL) ×2 IMPLANT
LIGASURE IMPACT 36 18CM CVD LR (INSTRUMENTS) IMPLANT
MANIFOLD NEPTUNE II (INSTRUMENTS) ×2 IMPLANT
MESH VENTRALEX ST 8CM LRG (Mesh General) IMPLANT
NDL HYPO 22X1.5 SAFETY MO (MISCELLANEOUS) ×2 IMPLANT
NEEDLE HYPO 22X1.5 SAFETY MO (MISCELLANEOUS) ×2 IMPLANT
NS IRRIG 500ML POUR BTL (IV SOLUTION) ×2 IMPLANT
PACK BASIN MAJOR ARMC (MISCELLANEOUS) ×2 IMPLANT
PACK BASIN MINOR ARMC (MISCELLANEOUS) ×2 IMPLANT
PACK COLON CLEAN CLOSURE (MISCELLANEOUS) ×2 IMPLANT
RELOAD STAPLE 75 3.8 BLU REG (ENDOMECHANICALS) IMPLANT
RETRACTOR WND ALEXIS-O 25 LRG (MISCELLANEOUS) IMPLANT
RETRACTOR WOUND ALXS 18CM MED (MISCELLANEOUS) IMPLANT
SOLN 0.9% NACL POUR BTL 1000ML (IV SOLUTION) ×2 IMPLANT
SOLN STERILE WATER 500 ML (IV SOLUTION) ×2 IMPLANT
SPONGE T-LAP 18X18 ~~LOC~~+RFID (SPONGE) ×2 IMPLANT
STAPLER GUN LINEAR PROX 60 (STAPLE) IMPLANT
STAPLER PROXIMATE 75MM BLUE (STAPLE) IMPLANT
STAPLER SKIN PROX 35W (STAPLE) ×2 IMPLANT
SUT ETHIBOND 0 MO6 C/R (SUTURE) ×2 IMPLANT
SUT PDS AB 0 CT1 27 (SUTURE) IMPLANT
SUT PDS PLUS AB 0 CT-2 (SUTURE) IMPLANT
SUT SILK 2-0 18XBRD TIE 12 (SUTURE) IMPLANT
SUT SILK 3 0 SH CR/8 (SUTURE) IMPLANT
SUT VIC AB 3-0 SH 27X BRD (SUTURE) ×2 IMPLANT
SUTURE MNCRL 4-0 27XMF (SUTURE) ×2 IMPLANT
SYR 10ML LL (SYRINGE) ×2 IMPLANT
SYR 20ML LL LF (SYRINGE) ×4 IMPLANT
TRAP FLUID SMOKE EVACUATOR (MISCELLANEOUS) ×2 IMPLANT
TRAY FOLEY MTR SLVR 16FR STAT (SET/KITS/TRAYS/PACK) ×2 IMPLANT

## 2024-07-21 NOTE — Op Note (Signed)
 Operative note  Preoperative diagnosis: Incarcerated ventral hernia Postoperative diagnosis: Incarcerated ventral hernia measuring approximately 4 cm Surgeon: Jayson Hobby, MD EBL: 25 cc Procedure: Open repair of ventral hernia with mesh, hernia containing incarcerated bowel, hernia measuring 4 cm   Indications: This is a 78 year old female that presented to the emergency department with 3-day history of nausea and vomiting.  She had a CT scan that showed an obstruction within a ventral hernia.  All risk, benefits alternatives were discussed with the patient prior to proceeding with surgical intervention.  After informed consent was obtained the patient was brought to the operating room and placed supine on the operating room table.  General endotracheal anesthesia was then induced.  Her abdomen was then prepped and draped in the usual sterile fashion.  A surgical timeout was called identifying correct patient, site, side and procedure.  Just superior to the umbilicus there was an obvious bulge and an incision was made over this.  This was taken down through the subcutaneous tissue and the hernia sac was identified.  I then proceeded to dissect the hernia sac down to the level of the fascia.  I was able to easily identify fascia at the superior and left lateral areas of the fascia.  Once I was able to do this it was difficult to come around the right lateral and inferior edges of the hernia sac.  At this time I elected to open the hernia sac to examine the contents.  The hernia sac was grasped and opened with Metzenbaum scissors.  There was noted to be a large amount of omental fat within the hernia sac.  This was eviscerated.  I was able to fall the omental fat down to the level of the fascia.  This was densely adhered to the hernia sac.  The omental fat was largely dissected from the hernia sac and then it was ligated with the LigaSure device.  Once I was able to excise a lot of the omentum that was  incarcerated in the hernia I did notice that there was some bowel that was in the hernia.  The bowel appeared pink and viable.  I was able to then with the fact gone manually reduced the small bowel into the abdominal cavity.  There was still hernia sac adhered to the umbilicus and I had not cleared off the fascial edges of the inferior and right medial edges.  The hernia sac was first dissected off of the umbilical stalk and this was taken down forts the fascia.  I was able to identify healthy fascia in the hernia sac was excised off of the fascia.  Once I was able to completely clear the fascial edges I also reduced the bowel into the abdominal cavity.  I did sweep into the abdominal cavity to ensure that there were no other hernias.  Some of the small bowel was eviscerated and run proximally and distally for several centimeters to ensure that the bowel that was seen in the hernia was viable.  It did appear nice and pink.  Once that the bowel was completely placed back in the abdomen the hernia was measured at 4 cm in length.  Using an 8 cm circular mesh the mesh was stitched to the fascia in the 4 cardinal directions and oppose nicely to the anterior abdominal wall.  I placed the mesh within the peritoneum.  Once the 4 cardinal directions were adhered to the anterior abdominal wall the fascial defect was then closed with a series  of 0 PDS sutures.  The tails of the mesh were brought through the defect and the mesh was further secured to the anterior abdominal wall with the tails.  Once the fascia was fully close the tails were cut off.  Hemostasis of the hernia cavity was then obtained.  The umbilical stalk was then tacked down to the fascia.  Hemostasis was again ensured.  The subcutaneous tissue was then closed with 3-0 Vicryl suture.  This closed off any of the dead space left by the large hernia sac.  The skin was then closed with staples and a Prevena wound vacuum system was applied.  Prior to termination of  the procedure all sponge and instrument counts were correct x 2.  I also did infiltrate the skin with 30 cc of half percent Marcaine with epinephrine .  A abdominal binder was then placed and the patient was awoken from general endotracheal anesthesia and transferred to the PACU in good condition.

## 2024-07-21 NOTE — Anesthesia Preprocedure Evaluation (Signed)
 Anesthesia Evaluation  Patient identified by MRN, date of birth, ID band Patient awake    Reviewed: Allergy & Precautions, NPO status , Patient's Chart, lab work & pertinent test results  History of Anesthesia Complications Negative for: history of anesthetic complications  Airway Mallampati: I  TM Distance: >3 FB Neck ROM: full    Dental  (+) Chipped, Poor Dentition   Pulmonary asthma , sleep apnea    Pulmonary exam normal        Cardiovascular hypertension, +CHF  (-) Past MI + dysrhythmias Atrial Fibrillation      Neuro/Psych  Neuromuscular disease  negative psych ROS   GI/Hepatic negative GI ROS, Neg liver ROS,,,  Endo/Other  diabetes, Type 2  Class 3 obesity  Renal/GU CRFRenal disease     Musculoskeletal   Abdominal   Peds  Hematology negative hematology ROS (+)   Anesthesia Other Findings Past Medical History: No date: Allergic rhinitis, cause unspecified No date: Anxiety No date: Chronic diastolic CHF (congestive heart failure) (HCC) No date: CKD (chronic kidney disease), stage III (HCC) No date: Edema No date: Essential hypertension, benign No date: Gout, unspecified No date: Heart murmur     Comment:  as child No date: Hyperlipidemia No date: Lipoma of unspecified site No date: Other and unspecified disc disorder of lumbar region No date: Other general symptoms(780.99) No date: PAC (premature atrial contraction) No date: Renal insufficiency No date: Restless legs syndrome (RLS) No date: Sebaceous cyst 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 No date: 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 complication, uncontrolled No date: Unspecified asthma(493.90) No date: Unspecified disease of pericardium No date: Unspecified sleep apnea No date: Unspecified vitamin D  deficiency No date: Vaginitis and vulvovaginitis, unspecified No date: Venous insufficiency  Past Surgical History: 2002:  CARDIAC CATHETERIZATION     Comment:  [facility] 09/15/2021: CATARACT EXTRACTION W/PHACO; Right     Comment:  Procedure: CATARACT EXTRACTION PHACO AND INTRAOCULAR               LENS PLACEMENT (IOC) RIGHT 10.19 01:01.8;  Surgeon:               Jaye Fallow, MD;  Location: [facility];                Service: Ophthalmology;  Laterality: Right; 09/29/2021: CATARACT EXTRACTION W/PHACO; Left     Comment:  Procedure: CATARACT EXTRACTION PHACO AND INTRAOCULAR               LENS PLACEMENT (IOC) LEFT DIABETIC 11.40 01:21.2;                Surgeon: Jaye Fallow, MD;  Location: [facility]              CNTR;  Service: Ophthalmology;  Laterality: Left; 09/25/2018: COLONOSCOPY WITH PROPOFOL ; N/A     Comment:  Procedure: COLONOSCOPY WITH PROPOFOL ;  Surgeon: Therisa Bi, MD;  Location: [facility] ENDOSCOPY;  Service:               Gastroenterology;  Laterality: N/A; 04/13/2019: COLONOSCOPY WITH PROPOFOL ; N/A     Comment:  Procedure: COLONOSCOPY WITH PROPOFOL ;  Surgeon: Therisa Bi, MD;  Location: [facility] END[facility]COPY;  Service:               Gastroenterology;  Laterality: N/A; 06/27/2020: COLONOSCOPY WITH PROPOFOL ; N/A  Comment:  Procedure: COLONOSCOPY WITH PROPOFOL ;  Surgeon: Therisa Bi, MD;  Location: [facility] ENDOSCOPY;  Service:               Gastroenterology;  Laterality: N/A; 08/02/2023: COLONOSCOPY WITH PROPOFOL ; N/A     Comment:  Procedure: COLONOSCOPY WITH PROPOFOL ;  Surgeon: Therisa Bi, MD;  Location: [facility] ENDOSCOPY;  Service:               Gastroenterology;  Laterality: N/A; No date: PERICARDIUM SURGERY 08/02/2023: POLYPECTOMY     Comment:  Procedure: POLYPECTOMY;  Surgeon: Therisa Bi, MD;                Location: [facility] ENDOSCOPY;  Service: Gastroenterology;;  BMI    Body Mass Index: 48.07 kg/m      Reproductive/Obstetrics negative OB ROS                              Anesthesia Physical Anesthesia  Plan  ASA: 3 and emergent  Anesthesia Plan: General ETT, Rapid Sequence and Cricoid Pressure   Post-op Pain Management:    Induction: Intravenous  PONV Risk Score and Plan: Ondansetron , Dexamethasone , Midazolam  and Treatment may vary due to age or medical condition  Airway Management Planned: Oral ETT  Additional Equipment:   Intra-op Plan:   Post-operative Plan: Extubation in OR and Possible Post-op intubation/ventilation  Informed Consent: I have reviewed the patients History and Physical, chart, labs and discussed the procedure including the risks, benefits and alternatives for the proposed anesthesia with the patient or authorized representative who has indicated his/her understanding and acceptance.     Dental Advisory Given  Plan Discussed with: Anesthesiologist, CRNA and Surgeon  Anesthesia Plan Comments: (Patient consented for risks of anesthesia including but not limited to:  - adverse reactions to medications - damage to eyes, teeth, lips or other oral mucosa - nerve damage due to positioning  - sore throat or hoarseness - Damage to heart, brain, nerves, lungs, other parts of body or loss of life  Patient voiced understanding and assent.)        Anesthesia Quick Evaluation

## 2024-07-21 NOTE — Anesthesia Postprocedure Evaluation (Signed)
 Anesthesia Post Note  Patient: Amber Reyes  Procedure(s) Performed: REPAIR, HERNIA, VENTRAL (Abdomen)  Patient location during evaluation: PACU Anesthesia Type: General Level of consciousness: awake and alert Pain management: pain level controlled Vital Signs Assessment: post-procedure vital signs reviewed and stable Respiratory status: spontaneous breathing, nonlabored ventilation and respiratory function stable Cardiovascular status: blood pressure returned to baseline and stable Postop Assessment: no apparent nausea or vomiting Anesthetic complications: no   No notable events documented.   Last Vitals:  Vitals:   07/21/24 1845 07/21/24 1907  BP: (!) 158/65 (!) 156/61  Pulse: 79   Resp: 15 19  Temp: 36.6 C (!) 36.3 C  SpO2: 100% 100%    Last Pain:  Vitals:   07/21/24 1907  TempSrc: Oral  PainSc: 0-No pain                 Fairy POUR Takeru Bose

## 2024-07-21 NOTE — H&P (Signed)
 Patient ID: Amber Reyes, female   DOB: [birth year], 77 y.o.   MRN: [id number] CC: Obstructed Ventral Hernia History of Present Illness Amber Reyes is a 78 y.o. female with past medical history as below including morbid obesity, CHF, A-fib on Eliquis  who presents in consultation for ventral hernia.  The patient says that on Thursday she did develop abdominal pain as well as nausea and vomiting.  She said the pain persisted and she continued to have profuse vomiting.  She reports that her last bowel movement was a day and a half ago and she has not been passing any gas.  She says that the bulge in her abdomen has increased and is more hard.  She came to the emergency department had a CT scan that showed an obstructed hernia.  Her last Eliquis  was last night at 7 PM.  Past Medical History Past Medical History:  Diagnosis Date   Allergic rhinitis, cause unspecified    Anxiety    Chronic diastolic CHF (congestive heart failure) (HCC)    CKD (chronic kidney disease), stage III (HCC)    Edema    Essential hypertension, benign    Gout, unspecified    Heart murmur    as child   Hyperlipidemia    Lipoma of unspecified site    Other and unspecified disc disorder of lumbar region    Other general symptoms(780.99)    PAC (premature atrial contraction)    Renal insufficiency    Restless legs syndrome (RLS)    Sebaceous cyst    Super obesity    Type II or unspecified type diabetes mellitus without mention of complication, uncontrolled    Unspecified asthma(493.90)    Unspecified disease of pericardium    Unspecified sleep apnea    Unspecified vitamin D  deficiency    Vaginitis and vulvovaginitis, unspecified    Venous insufficiency        Past Surgical History:  Procedure Laterality Date   CARDIAC CATHETERIZATION  2002   [facility]   CATARACT EXTRACTION W/PHACO Right 09/15/2021   Procedure: CATARACT EXTRACTION PHACO AND INTRAOCULAR LENS PLACEMENT (IOC) RIGHT 10.19 01:01.8;  Surgeon:  Jaye Fallow, MD;  Location: [facility];  Service: Ophthalmology;  Laterality: Right;   CATARACT EXTRACTION W/PHACO Left 09/29/2021   Procedure: CATARACT EXTRACTION PHACO AND INTRAOCULAR LENS PLACEMENT (IOC) LEFT DIABETIC 11.40 01:21.2;  Surgeon: Jaye Fallow, MD;  Location: [facility];  Service: Ophthalmology;  Laterality: Left;   COLONOSCOPY WITH PROPOFOL  N/A 09/25/2018   Procedure: COLONOSCOPY WITH PROPOFOL ;  Surgeon: Therisa Bi, MD;  Location: [facility] ENDOSCOPY;  Service: Gastroenterology;  Laterality: N/A;   COLONOSCOPY WITH PROPOFOL  N/A 04/13/2019   Procedure: COLONOSCOPY WITH PROPOFOL ;  Surgeon: Therisa Bi, MD;  Location: [facility] ENDOSCOPY;  Service: Gastroenterology;  Laterality: N/A;   COLONOSCOPY WITH PROPOFOL  N/A 06/27/2020   Procedure: COLONOSCOPY WITH PROPOFOL ;  Surgeon: Therisa Bi, MD;  Location: [facility] ENDOSCOPY;  Service: Gastroenterology;  Laterality: N/A;   COLONOSCOPY WITH PROPOFOL  N/A 08/02/2023   Procedure: COLONOSCOPY WITH PROPOFOL ;  Surgeon: Therisa Bi, MD;  Location: [facility] ENDOSCOPY;  Service: Gastroenterology;  Laterality: N/A;   PERICARDIUM SURGERY     POLYPECTOMY  08/02/2023   Procedure: POLYPECTOMY;  Surgeon: Therisa Bi, MD;  Location: [facility] ENDOSCOPY;  Service: Gastroenterology;;    Allergies  Allergen Reactions   Farxiga  [Dapagliflozin ] Hives    Current Facility-Administered Medications  Medication Dose Route Frequency Provider Last Rate Last Admin   ceFAZolin (ANCEF) IVPB 3g/150 mL premix  3 g Intravenous Once Marinda,  Jayson KIDD, MD       Current Outpatient Medications  Medication Sig Dispense Refill   acetaminophen  (TYLENOL ) 650 MG CR tablet Take 650 mg by mouth in the morning and at bedtime.     Albuterol -Budesonide  (AIRSUPRA ) 90-80 MCG/ACT AERO Inhale 2 puffs into the lungs 4 (four) times daily as needed. 10.7 g 1   Alcohol Swabs (DROPSAFE ALCOHOL PREP) 70 % PADS USE AS DIRECTED 300 each 3   allopurinol  (ZYLOPRIM ) 100 MG tablet TAKE 1  TABLET TWICE DAILY 180 tablet 3   apixaban  (ELIQUIS ) 5 MG TABS tablet Take 1 tablet (5 mg total) by mouth 2 (two) times daily. 60 tablet 5   Blood Glucose Calibration (TRUE METRIX LEVEL 1) Low SOLN 1 each by In Vitro route once a week. 1 each 1   Blood Glucose Monitoring Suppl (TRUE METRIX AIR GLUCOSE METER) w/Device KIT Inject 1 each into the skin 4 (four) times daily as needed. Check fsbs 4 times daily E11.22 1 kit 0   chlorthalidone (HYGROTON) 25 MG tablet Take 12.5 mg by mouth daily.     Cholecalciferol  (VITAMIN D ) 2000 UNITS tablet Take 2,000 Units by mouth daily.     colchicine  (COLCRYS ) 0.6 MG tablet Take 1-2 tablets (0.6-1.2 mg total) by mouth daily as needed. 30 tablet 0   Continuous Glucose Sensor (FREESTYLE LIBRE 3 PLUS SENSOR) MISC Change sensor every 15 days.     diltiazem  (CARDIZEM  CD) 240 MG 24 hr capsule TAKE 1 CAPSULE EVERY DAY 90 capsule 3   DROPLET PEN NEEDLES 30G X 8 MM MISC USE TWICE DAILY WITH INSULIN  200 each 3   Dulaglutide  (TRULICITY ) 3 MG/0.5ML SOAJ Inject 3 mg as directed once a week. 6 mL 3   Elastic Bandages & Supports (MEDICAL COMPRESSION STOCKINGS) MISC 2 each by Does not apply route daily. 2 each 2   Ferrous Sulfate (IRON  SUPPLEMENT PO) Take 1 tablet by mouth daily.     fluticasone  (FLONASE ) 50 MCG/ACT nasal spray USE 2 SPRAYS IN EACH NOSTRIL EVERY DAY 48 g 3   fluticasone  furoate-vilanterol (BREO ELLIPTA ) 100-25 MCG/ACT AEPB Inhale 1 puff into the lungs daily.     furosemide  (LASIX ) 20 MG tablet Take 1 tablet (20 mg total) by mouth 2 (two) times daily. (Patient taking differently: Take 20 mg by mouth as needed.) 180 tablet 1   Insulin  Lispro Prot & Lispro (HUMALOG  MIX 75/25 KWIKPEN) (75-25) 100 UNIT/ML Kwikpen Inject 10-12 Units into the skin 2 (two) times daily. 45 mL 3   irbesartan  (AVAPRO ) 300 MG tablet Take 1 tablet (300 mg total) by mouth daily. 90 tablet 1   loratadine  (CLARITIN ) 10 MG tablet TAKE 1 TABLET(10 MG) BY MOUTH DAILY 30 tablet 3   pravastatin   (PRAVACHOL ) 80 MG tablet Take 1 tablet (80 mg total) by mouth every evening. 90 tablet 1   TRUE METRIX BLOOD GLUCOSE TEST test strip CHECK BLOOD SUGAR FOUR TIMES DAILY 400 strip 3   TRUEplus Lancets 33G MISC TEST BLOOD SUGAR THREE TIMES DAILY AS NEEDED 300 each 3    Family History Family History  Problem Relation Age of Onset   Heart attack Mother    Hypertension Mother    Bladder Cancer Father    Breast cancer Neg Hx        Social History Social History   Tobacco Use   Smoking status: Never    Passive exposure: Never   Smokeless tobacco: Never   Tobacco comments:    n/a  Vaping Use  Vaping status: Never Used  Substance Use Topics   Alcohol use: No    Alcohol/week: 0.0 standard drinks of alcohol   Drug use: No        ROS Full ROS of systems performed and is otherwise negative there than what is stated in the HPI  Physical Exam Blood pressure (!) 160/90, pulse 62, temperature 97.9 F (36.6 C), temperature source Oral, resp. rate 18, height 5' 10 (1.778 m), weight (!) 152 kg, SpO2 100%.  Alert and oriented x 3, normal work of breathing on room air, regular rate and rhythm, abdomen is distended, tender to palpation just above the umbilicus with a nonreducible bulge and a small amount of blue discoloration over the area, moving all extremities spontaneously, obese, mood and affect appropriate  Data Reviewed CT scan reviewed and she does have an ventral hernia that has a loop of bowel in it.  The proximal bowel does seem a little bit dilated and there is decompressed distal bowel.  There is some fat stranding within the hernia.  She does have a leukocytosis to 15,000  I have personally reviewed the patient's imaging and medical records.    Assessment    Patient with small bowel obstruction within hernia.  Plan    Will need to proceed to the operating room for open ventral hernia repair and possible use of mesh as well as possible small bowel resection.  I  discussed at length the risk, benefits alternatives the procedure including risk of infection, bleeding, need for small bowel resection, fistula formation, recurrence of hernia.  I also discussed she is at increased risk of bleeding given her anticoagulation status.    Jayson MALVA Endow 07/21/2024, 3:59 PM

## 2024-07-21 NOTE — ED Notes (Signed)
 Patient ambulated to wheelchair then to toliet fo rUA sample.

## 2024-07-21 NOTE — Transfer of Care (Signed)
 Immediate Anesthesia Transfer of Care Note  Patient: Amber Reyes  Procedure(s) Performed: REPAIR, HERNIA, VENTRAL (Abdomen)  Patient Location: PACU  Anesthesia Type:General  Level of Consciousness: awake  Airway & Oxygen Therapy: Patient Spontanous Breathing and Patient connected to face mask oxygen  Post-op Assessment: Report given to RN and Post -op Vital signs reviewed and stable  Post vital signs: Reviewed  Last Vitals:  Vitals Value Taken Time  BP 140/67   Temp    Pulse 39 07/21/24 18:25  Resp 19 07/21/24 18:25  SpO2 98 % 07/21/24 18:25  Vitals shown include unfiled device data.  Last Pain:         Complications: No notable events documented.

## 2024-07-21 NOTE — Anesthesia Procedure Notes (Signed)
 Procedure Name: Intubation Date/Time: 07/21/2024 4:36 PM  Performed by: Leontine Katz, CRNAPre-anesthesia Checklist: Patient identified, Patient being monitored, Timeout performed, Emergency Drugs available and Suction available Patient Re-evaluated:Patient Re-evaluated prior to induction Oxygen Delivery Method: Circle system utilized Preoxygenation: Pre-oxygenation with 100% oxygen Induction Type: IV induction and Rapid sequence Laryngoscope Size: McGrath and 4 Grade View: Grade I Tube type: Oral Tube size: 7.0 mm Number of attempts: 1 Airway Equipment and Method: Stylet and Video-laryngoscopy Placement Confirmation: ETT inserted through vocal cords under direct vision, positive ETCO2 and breath sounds checked- equal and bilateral Secured at: 21 cm Tube secured with: Tape Dental Injury: Teeth and Oropharynx as per pre-operative assessment

## 2024-07-21 NOTE — ED Provider Notes (Signed)
 [facility] Provider Note    Event Date/Time   First MD Initiated Contact with Patient 07/21/24 1155     (approximate)   History   Abdominal Pain   HPI  Amber Reyes is a 78 y.o. female   presents to the ED from home with complaint of nausea and vomiting that started 2 days ago.  Patient states that she has been unable to keep anything down but has been taking sips of water.  She denies any fever and uncertain of chills.  She denies any shortness of breath, cough, body aches or diarrhea.  No known sick contacts.  Patient has history of an umbilical hernia which is followed by surgery and she was recently seen by Dr. Marinda on 07/17/2024.  Patient has history of hypertension, chronic CHF, CKD, type 2 diabetes, asthma, sleep apnea, PACs, anticoagulant therapy and morbid obesity.  Patient takes Eliquis  5 mg twice daily.  Last BM was 2 days ago.   Physical Exam   Triage Vital Signs: ED Triage Vitals  Encounter Vitals Group     BP 07/21/24 1144 (!) 160/90     Girls Systolic BP Percentile --      Girls Diastolic BP Percentile --      Boys Systolic BP Percentile --      Boys Diastolic BP Percentile --      Pulse Rate 07/21/24 1144 62     Resp 07/21/24 1144 18     Temp 07/21/24 1144 97.9 F (36.6 C)     Temp Source 07/21/24 1144 Oral     SpO2 07/21/24 1144 100 %     Weight 07/21/24 1145 (!) 335 lb (152 kg)     Height 07/21/24 1145 5' 10 (1.778 m)     Head Circumference --      Peak Flow --      Pain Score 07/21/24 1145 10     Pain Loc --      Pain Education --      Exclude from Growth Chart --     Most recent vital signs: Vitals:   07/21/24 1144  BP: (!) 160/90  Pulse: 62  Resp: 18  Temp: 97.9 F (36.6 C)  SpO2: 100%     General: Awake, no distress.  Alert, talkative, cooperative. CV:  Good peripheral perfusion.  Heart rate and rate rhythm. Resp:  Normal effort.  Lungs are clear bilaterally. Abd:  Mild distention, markedly obese.   Moderate tenderness umbilicus and to the left lower quadrant area.  Bowel sounds are faint to nonexistent and difficult to hear at this time. Other:     ED Results / Procedures / Treatments   Labs (all labs ordered are listed, but only abnormal results are displayed) Labs Reviewed  CBC - Abnormal; Notable for the following components:      Result Value   WBC 15.2 (*)    RBC 3.85 (*)    Hemoglobin 11.3 (*)    HCT 34.8 (*)    All other components within normal limits  BASIC METABOLIC PANEL WITH GFR - Abnormal; Notable for the following components:   Sodium 134 (*)    Potassium 5.5 (*)    Glucose, Bld 203 (*)    BUN 35 (*)    Creatinine, Ser 1.30 (*)    GFR, Estimated 42 (*)    All other components within normal limits  URINALYSIS, ROUTINE W REFLEX MICROSCOPIC - Abnormal; Notable for the following components:   Color,  Urine YELLOW (*)    APPearance CLEAR (*)    Hgb urine dipstick SMALL (*)    Protein, ur >=300 (*)    Bacteria, UA RARE (*)    All other components within normal limits  RESP PANEL BY RT-PCR (RSV, FLU A&B, COVID)  RVPGX2  LIPASE, BLOOD     EKG  pending   RADIOLOGY CT abdomen/pelvis with contrast per radiologist shows partial small bowel obstruction due to small bowel extending into a narrow based ventral hernia the level of the umbilicus.     PROCEDURES:  Critical Care performed:   Procedures   MEDICATIONS ORDERED IN ED: Medications  sodium chloride  0.9 % bolus 1,000 mL (0 mLs Intravenous Stopped 07/21/24 1335)  ondansetron  (ZOFRAN ) injection 4 mg (4 mg Intravenous Given 07/21/24 1227)  iohexol  (OMNIPAQUE ) 300 MG/ML solution 80 mL (80 mLs Intravenous Contrast Given 07/21/24 1357)     IMPRESSION / MDM / ASSESSMENT AND PLAN / ED COURSE  I reviewed the triage vital signs and the nursing notes.   Differential diagnosis includes, but is not limited to, small bowel obstruction, umbilical hernia/incarcerated, viral gastritis,  UTI.  ----------------------------------------- 3:18 PM on 07/21/2024 ----------------------------------------- Spoke with Dr. Marinda who is on-call for surgery who is familiar with this patient seen in his office 4 days ago.  Patient with small bowel obstruction located at the level of her hernia.  Dr. Marinda will be coming in to see her in the ED.  Patient was made aware.  At this time she is not requesting any pain medication and states that the nausea has improved with the one-time dose of Zofran .      Patient's presentation is most consistent with acute complicated illness / injury requiring diagnostic workup.  FINAL CLINICAL IMPRESSION(S) / ED DIAGNOSES   Final diagnoses:  Partial small bowel obstruction (HCC)     Rx / DC Orders   ED Discharge Orders     None        Note:  This document was prepared using Dragon voice recognition software and may include unintentional dictation errors.   Saunders Shona CROME, PA-C 07/21/24 1524    Arlander Charleston, MD 07/21/24 [phone]

## 2024-07-21 NOTE — ED Triage Notes (Signed)
 Pt to ED via POV from Home for hernia and nausea/vomiting. N/V started Thursday night. Pt states unable to keep anything down. Pt denies any fever at home and CP/SOB

## 2024-07-22 DIAGNOSIS — K56609 Unspecified intestinal obstruction, unspecified as to partial versus complete obstruction: Secondary | ICD-10-CM

## 2024-07-22 DIAGNOSIS — K436 Other and unspecified ventral hernia with obstruction, without gangrene: Secondary | ICD-10-CM | POA: Diagnosis not present

## 2024-07-22 LAB — BASIC METABOLIC PANEL WITH GFR
Anion gap: 11 (ref 5–15)
BUN: 36 mg/dL — ABNORMAL HIGH (ref 8–23)
CO2: 20 mmol/L — ABNORMAL LOW (ref 22–32)
Calcium: 8.5 mg/dL — ABNORMAL LOW (ref 8.9–10.3)
Chloride: 103 mmol/L (ref 98–111)
Creatinine, Ser: 1.49 mg/dL — ABNORMAL HIGH (ref 0.44–1.00)
GFR, Estimated: 36 mL/min — ABNORMAL LOW (ref >60)
Glucose, Bld: 245 mg/dL — ABNORMAL HIGH (ref 70–99)
Potassium: 5.5 mmol/L — ABNORMAL HIGH (ref 3.5–5.1)
Sodium: 134 mmol/L — ABNORMAL LOW (ref 135–145)

## 2024-07-22 LAB — CBC
HCT: 31.8 % — ABNORMAL LOW (ref 36.0–46.0)
Hemoglobin: 10.2 g/dL — ABNORMAL LOW (ref 12.0–15.0)
MCH: 29.2 pg (ref 26.0–34.0)
MCHC: 32.1 g/dL (ref 30.0–36.0)
MCV: 91.1 fL (ref 80.0–100.0)
Platelets: 228 K/uL (ref 150–400)
RBC: 3.49 MIL/uL — ABNORMAL LOW (ref 3.87–5.11)
RDW: 15.3 % (ref 11.5–15.5)
WBC: 15.7 K/uL — ABNORMAL HIGH (ref 4.0–10.5)
nRBC: 0 % (ref 0.0–0.2)

## 2024-07-22 LAB — GLUCOSE, CAPILLARY
Glucose-Capillary: 198 mg/dL — ABNORMAL HIGH (ref 70–99)
Glucose-Capillary: 221 mg/dL — ABNORMAL HIGH (ref 70–99)
Glucose-Capillary: 204 mg/dL — ABNORMAL HIGH (ref 70–99)
Glucose-Capillary: 183 mg/dL — ABNORMAL HIGH (ref 70–99)

## 2024-07-22 LAB — PHOSPHORUS: Phosphorus: 4.2 mg/dL (ref 2.5–4.6)

## 2024-07-22 LAB — POTASSIUM: Potassium: 5.6 mmol/L — ABNORMAL HIGH (ref 3.5–5.1)

## 2024-07-22 LAB — MAGNESIUM: Magnesium: 1.9 mg/dL (ref 1.7–2.4)

## 2024-07-22 MED ORDER — BUDESONIDE 0.25 MG/2ML IN SUSP: 0.2500 mg | Freq: Four times a day (QID) | RESPIRATORY_TRACT | Status: DC | PRN

## 2024-07-22 MED ORDER — HYDRALAZINE HCL 25 MG PO TABS
25.0000 mg | ORAL_TABLET | Freq: Three times a day (TID) | ORAL | Status: DC
  Administered 2024-07-22 – 2024-07-26 (×11): 25 mg via ORAL
  Filled 2024-07-22 (×12): qty 1

## 2024-07-22 MED ORDER — HYDRALAZINE HCL 20 MG/ML IJ SOLN: 5.0000 mg | Freq: Four times a day (QID) | INTRAMUSCULAR | Status: DC | PRN

## 2024-07-22 MED ORDER — HYDRALAZINE HCL 10 MG PO TABS: 10.0000 mg | ORAL_TABLET | Freq: Three times a day (TID) | ORAL | Status: DC

## 2024-07-22 MED ORDER — ALBUTEROL SULFATE (2.5 MG/3ML) 0.083% IN NEBU: 2.5000 mg | INHALATION_SOLUTION | Freq: Four times a day (QID) | RESPIRATORY_TRACT | Status: DC | PRN

## 2024-07-22 MED ORDER — PHENOL 1.4 % MT LIQD
1.0000 | OROMUCOSAL | Status: DC | PRN
  Administered 2024-07-22 – 2024-07-23 (×3): 1 via OROMUCOSAL
  Filled 2024-07-22: qty 177

## 2024-07-22 MED ORDER — INSULIN GLARGINE-YFGN 100 UNIT/ML ~~LOC~~ SOLN: 10.0000 [IU] | Freq: Every day | SUBCUTANEOUS | Status: DC

## 2024-07-22 MED ORDER — LINAGLIPTIN 5 MG PO TABS
5.0000 mg | ORAL_TABLET | Freq: Every day | ORAL | Status: DC
  Administered 2024-07-22 – 2024-07-26 (×5): 5 mg via ORAL
  Filled 2024-07-22 (×5): qty 1

## 2024-07-22 MED ORDER — SODIUM ZIRCONIUM CYCLOSILICATE 10 G PO PACK
10.0000 g | PACK | Freq: Once | ORAL | Status: AC
  Administered 2024-07-22: 10 g via ORAL
  Filled 2024-07-22: qty 1

## 2024-07-22 MED ORDER — ISOSORBIDE MONONITRATE ER 30 MG PO TB24
30.0000 mg | ORAL_TABLET | Freq: Every day | ORAL | Status: DC
  Administered 2024-07-22 – 2024-07-25 (×3): 30 mg via ORAL
  Filled 2024-07-22 (×3): qty 1

## 2024-07-22 NOTE — Progress Notes (Addendum)
 CC: Obstructed Ventral Hernia Subjective: Patient is postop day 1 from ventral hernia repair.  She reports doing well.  She denies any further nausea or vomiting.  She says that she feels better in her stomach as far as her pain.  She denies any bowel function.  Objective: Vital signs in last 24 hours: Temp:  [97.3 F (36.3 C)-98.5 F (36.9 C)] 97.6 F (36.4 C) (11/30 0829) Pulse Rate:  [72-98] 79 (11/30 1034) Resp:  [12-19] 16 (11/30 0829) BP: (140-183)/(53-85) 159/68 (11/30 1034) SpO2:  [97 %-100 %] 100 % (11/30 0829) Last BM Date : 07/19/24  Intake/Output from previous day: 11/29 0701 - 11/30 0700 In: 3082.3 [I.V.:2682.3; IV Piggyback:400] Out: 25 [Blood:25] Intake/Output this shift: Total I/O In: 105 [I.V.:105] Out: -   Physical exam:  Alert and oriented x 3, no work of breathing on room air, regular rate and rhythm, abdomen is soft, mild tenderness over the incision and there is a Prevena in place with good seal, no bulging as there was yesterday, she is obese  Lab Results: CBC  Recent Labs    07/21/24 1148 07/22/24 0425  WBC 15.2* 15.7*  HGB 11.3* 10.2*  HCT 34.8* 31.8*  PLT 240 228   BMET Recent Labs    07/21/24 1148 07/22/24 0425  NA 134* 134*  K 5.5* 5.5*  CL 101 103  CO2 22 20*  GLUCOSE 203* 245*  BUN 35* 36*  CREATININE 1.30* 1.49*  CALCIUM  9.4 8.5*   PT/INR No results for input(s): LABPROT, INR in the last 72 hours. ABG No results for input(s): PHART, HCO3 in the last 72 hours.  Invalid input(s): PCO2, PO2  Studies/Results: CT ABDOMEN PELVIS W CONTRAST Result Date: 07/21/2024 CLINICAL DATA:  Nausea vomiting.  Hernia. EXAM: CT ABDOMEN AND PELVIS WITH CONTRAST TECHNIQUE: Multidetector CT imaging of the abdomen and pelvis was performed using the standard protocol following bolus administration of intravenous contrast. RADIATION DOSE REDUCTION: This exam was performed according to the departmental dose-optimization program which  includes automated exposure control, adjustment of the mA and/or kV according to patient size and/or use of iterative reconstruction technique. CONTRAST:  80mL OMNIPAQUE  IOHEXOL  300 MG/ML  SOLN COMPARISON:  04/24/2024. FINDINGS: Lower chest: 2 tiny stable right lower lobe pulmonary nodules. Lung bases otherwise clear. Hepatobiliary: Mild decrease in the liver attenuation suggests fatty infiltration. No liver mass or focal lesion. Normal liver size. Multiple dependent gallstones. No acute cholecystitis. No bile duct dilation. Pancreas: Unremarkable. No pancreatic ductal dilatation or surrounding inflammatory changes. Spleen: Normal in size without focal abnormality. Adrenals/Urinary Tract: Normal adrenal glands. Multiple low-attenuation bilateral renal masses consistent with cysts. No collecting system stones. No hydronephrosis. Normal ureters. Normal bladder. Stomach/Bowel: Distal small bowel enters a ventral hernia. Small bowel proximal to this is mildly dilated with air-fluid levels. Bowel distal to the hernia is decompressed as is the majority of the colon. There is inflammatory stranding and haziness within the herniated fat adjacent to the herniated small bowel. No bowel wall thickening, however. Stomach is unremarkable.  Normal appendix visualized. Vascular/Lymphatic: Aortic atherosclerosis. No aneurysm. No enlarged lymph nodes. Reproductive: Stable uterine calcifications. Small postmenopausal uterus. No adnexal masses. Other: No ascites. Musculoskeletal: No fracture or acute finding.  No bone lesion. IMPRESSION: 1. Partial small bowel obstruction due to small bowel extending into a narrow based ventral hernia the level of the umbilicus. Inflammatory type changes are noted within the herniated fat adjacent to the herniated bowel. 2. No other acute abnormality within the abdomen or pelvis. 3.  Aortic atherosclerosis. Stable gallstones and renal cysts. Probable mild hepatic steatosis. Electronically Signed   By:  Alm Parkins M.D.   On: 07/21/2024 14:30    Anti-infectives: Anti-infectives (From admission, onward)    Start     Dose/Rate Route Frequency Ordered Stop   07/22/24 0100  ceFAZolin (ANCEF) IVPB 3g/150 mL premix        3 g 300 mL/hr over 30 Minutes Intravenous Every 8 hours 07/21/24 1944 07/22/24 2159   07/21/24 1700  ceFAZolin (ANCEF) IVPB 3g/150 mL premix        3 g 300 mL/hr over 30 Minutes Intravenous  Once 07/21/24 1558 07/21/24 1715       Assessment/Plan:  Patient postop day 1 from ventral hernia repair due to obstructed ventral hernia.  We did use mesh.  We will advance to clear liquid diet today.  I told her if she starts to have bowel movements we can advance her further.  Continue perioperative antibiotics for 24 hours.  We will hold her Eliquis  due to bleeding risk at this time.  We will consult hospitalist team for assistance with chronic kidney disease, diabetes as well as his A-fib.  Continue on IV fluids for now given that we will just give clear liquid diet  Total of 5 minutes spent reviewing the patient's chart, performing an interval history and physical and discussing treatment options with the patient.  Jayson Endow, M.D. [facility]

## 2024-07-22 NOTE — Plan of Care (Signed)
  Problem: Education: Goal: Knowledge of General Education information will improve Description: Including pain rating scale, medication(s)/side effects and non-pharmacologic comfort measures Outcome: Progressing   Problem: Health Behavior/Discharge Planning: Goal: Ability to manage health-related needs will improve Outcome: Progressing   Problem: Clinical Measurements: Goal: Ability to maintain clinical measurements within normal limits will improve Outcome: Progressing Goal: Will remain free from infection Outcome: Progressing Goal: Diagnostic test results will improve Outcome: Progressing Goal: Respiratory complications will improve Outcome: Progressing Goal: Cardiovascular complication will be avoided Outcome: Progressing   Problem: Activity: Goal: Risk for activity intolerance will decrease Outcome: Progressing   Problem: Nutrition: Goal: Adequate nutrition will be maintained Outcome: Progressing   Problem: Coping: Goal: Level of anxiety will decrease Outcome: Progressing   Problem: Elimination: Goal: Will not experience complications related to bowel motility Outcome: Progressing Goal: Will not experience complications related to urinary retention Outcome: Progressing   Problem: Safety: Goal: Ability to remain free from injury will improve Outcome: Progressing   Problem: Skin Integrity: Goal: Risk for impaired skin integrity will decrease Outcome: Progressing   Problem: Education: Goal: Ability to describe self-care measures that may prevent or decrease complications (Diabetes Survival Skills Education) will improve Outcome: Progressing Goal: Individualized Educational Video(s) Outcome: Progressing   Problem: Coping: Goal: Ability to adjust to condition or change in health will improve Outcome: Progressing   Problem: Fluid Volume: Goal: Ability to maintain a balanced intake and output will improve Outcome: Progressing   Problem: Health  Behavior/Discharge Planning: Goal: Ability to identify and utilize available resources and services will improve Outcome: Progressing   Problem: Metabolic: Goal: Ability to maintain appropriate glucose levels will improve Outcome: Progressing   Problem: Nutritional: Goal: Maintenance of adequate nutrition will improve Outcome: Progressing Goal: Progress toward achieving an optimal weight will improve Outcome: Progressing   Problem: Skin Integrity: Goal: Risk for impaired skin integrity will decrease Outcome: Progressing   Problem: Tissue Perfusion: Goal: Adequacy of tissue perfusion will improve Outcome: Progressing

## 2024-07-22 NOTE — Consult Note (Signed)
 Initial Consultation Note   Patient: Amber Reyes FMW:[id number] DOB: 10-Nov-1945 PCP: Sowles, Krichna, MD DOA: 07/21/2024 DOS: the patient was seen and examined on 07/22/2024 Primary service: Marinda Jayson KIDD, MD  Referring physician: Dr. Jayson Marinda Reason for consult: HTN, BM, CKD  Assessment/Plan:  Hyperkalemia - Secondary to ARB, discontinue irbesartan  - Lokelma x 1, continue IV fluid - Recheck potassium level this afternoon  HTN - Discontinue irbesartan  secondary to hyperkalemia - Discontinue HCTZ as patient has history of CKD stage IIIb, expect HCTZ to be less efficient - Start hydralazine  plus Imdur regimen -Continue Cardizem  and Lasix  - Add as needed hydralazine  for possible BP  IDDM with hyperglycemia - Agreed with sliding scale - Add Januvia - Most recent A1c 7.1  Leukocytosis - Probably secondary to incarcerated bowel, no symptoms or signs of peritonitis, status post 3 doses of antibiotics.  Further antibiotics management as per primary team. - Continue to use incentive spirometry, no urinary symptoms.  CKD stage IIIb - Euvolemic, change HCTZ plus ARB to hydralazine  plus Imdur regimen  PAF - Continue Cardizem  - Eliquis  on hold as per primary team  Gout - Continue allopurinol   Incarcerated ventral hernia - Status post open hernia repair - N.p.o. IV fluid pain medication as per primary team  TRH will continue to follow the patient.  HPI: Amber Reyes is a 78 y.o. female with past medical history of HTN, chronic HFpEF, PAF on Eliquis , IDDM, CKD stage IIIb, morbid obesity, gout, presented with incarcerated ventral hernia requiring surgical repair.  Patient tolerated procedure well.  Medical team was asked by surgeon Dr. Marinda to manage patient's baseline health problem including hypertension diabetes CKD.  Patient has no complaints, review of patient recent labs shows persistent hypokalemia K5.5> 5.5.  Patient reported that she was told potassium  was high 1 time this year and irbesartan  was held briefly.  Otherwise her BP meds regimen have been stable.  She reported fairly controlled diabetes with most recent A1c-7.1 in August.  Review of Systems: As mentioned in the history of present illness. All other systems reviewed and are negative. Past Medical History:  Diagnosis Date   Allergic rhinitis, cause unspecified    Anxiety    Chronic diastolic CHF (congestive heart failure) (HCC)    CKD (chronic kidney disease), stage III (HCC)    Edema    Essential hypertension, benign    Gout, unspecified    Heart murmur    as child   Hyperlipidemia    Lipoma of unspecified site    Other and unspecified disc disorder of lumbar region    Other general symptoms(780.99)    PAC (premature atrial contraction)    Renal insufficiency    Restless legs syndrome (RLS)    Sebaceous cyst    Super obesity    Type II or unspecified type diabetes mellitus without mention of complication, uncontrolled    Unspecified asthma(493.90)    Unspecified disease of pericardium    Unspecified sleep apnea    Unspecified vitamin D  deficiency    Vaginitis and vulvovaginitis, unspecified    Venous insufficiency    Past Surgical History:  Procedure Laterality Date   CARDIAC CATHETERIZATION  2002   [facility]   CATARACT EXTRACTION W/PHACO Right 09/15/2021   Procedure: CATARACT EXTRACTION PHACO AND INTRAOCULAR LENS PLACEMENT (IOC) RIGHT 10.19 01:01.8;  Surgeon: Jaye Fallow, MD;  Location: [facility];  Service: Ophthalmology;  Laterality: Right;   CATARACT EXTRACTION W/PHACO Left 09/29/2021   Procedure: CATARACT EXTRACTION PHACO AND  INTRAOCULAR LENS PLACEMENT (IOC) LEFT DIABETIC 11.40 01:21.2;  Surgeon: Jaye Fallow, MD;  Location: [facility];  Service: Ophthalmology;  Laterality: Left;   COLONOSCOPY WITH PROPOFOL  N/A 09/25/2018   Procedure: COLONOSCOPY WITH PROPOFOL ;  Surgeon: Therisa Bi, MD;  Location: [facility] END[facility];  Service:  Gastroenterology;  Laterality: N/A;   COLONOSCOPY WITH PROPOFOL  N/A 04/13/2019   Procedure: COLONOSCOPY WITH PROPOFOL ;  Surgeon: Therisa Bi, MD;  Location: [facility] END[facility];  Service: Gastroenterology;  Laterality: N/A;   COLONOSCOPY WITH PROPOFOL  N/A 06/27/2020   Procedure: COLONOSCOPY WITH PROPOFOL ;  Surgeon: Therisa Bi, MD;  Location: [facility] END[facility]COPY;  Service: Gastroenterology;  Laterality: N/A;   COLONOSCOPY WITH PROPOFOL  N/A 08/02/2023   Procedure: COLONOSCOPY WITH PROPOFOL ;  Surgeon: Therisa Bi, MD;  Location: [facility] ENDOSCOPY;  Service: Gastroenterology;  Laterality: N/A;   PERICARDIUM SURGERY     POLYPECTOMY  08/02/2023   Procedure: POLYPECTOMY;  Surgeon: Therisa Bi, MD;  Location: [facility] ENDOSCOPY;  Service: Gastroenterology;;   Social History:  reports that she has never smoked. She has never been exposed to tobacco smoke. She has never used smokeless tobacco. She reports that she does not drink alcohol and does not use drugs.  Allergies  Allergen Reactions   Farxiga  [Dapagliflozin ] Hives    Family History  Problem Relation Age of Onset   Heart attack Mother    Hypertension Mother    Bladder Cancer Father    Breast cancer Neg Hx     Prior to Admission medications   Medication Sig Start Date End Date Taking? Authorizing Provider  acetaminophen  (TYLENOL ) 650 MG CR tablet Take 650 mg by mouth in the morning and at bedtime.    [provider]  Albuterol -Budesonide  (AIRSUPRA ) 90-80 MCG/ACT AERO Inhale 2 puffs into the lungs 4 (four) times daily as needed. 01/13/24   Sowles, Krichna, MD  Alcohol Swabs (DROPSAFE ALCOHOL PREP) 70 % PADS USE AS DIRECTED 05/18/23   Glenard, Krichna, MD  allopurinol  (ZYLOPRIM ) 100 MG tablet TAKE 1 TABLET TWICE DAILY 08/02/23   Sowles, Krichna, MD  apixaban  (ELIQUIS ) 5 MG TABS tablet Take 1 tablet (5 mg total) by mouth 2 (two) times daily. 11/03/23   Darron Deatrice LABOR, MD  Blood Glucose Calibration (TRUE METRIX LEVEL 1) Low SOLN 1 each by In Vitro  route once a week. 12/12/17   Sowles, Krichna, MD  Blood Glucose Monitoring Suppl (TRUE METRIX AIR GLUCOSE METER) w/Device KIT Inject 1 each into the skin 4 (four) times daily as needed. Check fsbs 4 times daily E11.22 01/13/24   Sowles, Krichna, MD  chlorthalidone (HYGROTON) 25 MG tablet Take 12.5 mg by mouth daily. 01/05/24 02/08/25  Leavy Aloysius LABOR, MD  Cholecalciferol  (VITAMIN D ) 2000 UNITS tablet Take 2,000 Units by mouth daily.    [provider]  colchicine  (COLCRYS ) 0.6 MG tablet Take 1-2 tablets (0.6-1.2 mg total) by mouth daily as needed. 11/25/17   Sowles, Krichna, MD  Continuous Glucose Sensor (FREESTYLE LIBRE 3 PLUS SENSOR) MISC Change sensor every 15 days. 06/25/24   Sowles, Krichna, MD  diltiazem  (CARDIZEM  CD) 240 MG 24 hr capsule TAKE 1 CAPSULE EVERY DAY 04/16/24   Darron Deatrice LABOR, MD  DROPLET PEN NEEDLES 30G X 8 MM MISC USE TWICE DAILY WITH INSULIN  09/08/23   Sowles, Krichna, MD  Dulaglutide  (TRULICITY ) 3 MG/0.5ML SOAJ Inject 3 mg as directed once a week. 06/29/24   Sowles, Krichna, MD  Elastic Bandages & Supports (MEDICAL COMPRESSION STOCKINGS) MISC 2 each by Does not apply route daily. 04/20/16   Sowles,  Krichna, MD  Ferrous Sulfate (IRON  SUPPLEMENT PO) Take 1 tablet by mouth daily.    [provider]  fluticasone  (FLONASE ) 50 MCG/ACT nasal spray USE 2 SPRAYS IN EACH NOSTRIL EVERY DAY 03/25/23   Sowles, Krichna, MD  fluticasone  furoate-vilanterol (BREO ELLIPTA ) 100-25 MCG/ACT AEPB Inhale 1 puff into the lungs daily. 06/29/24   Sowles, Krichna, MD  furosemide  (LASIX ) 20 MG tablet Take 1 tablet (20 mg total) by mouth 2 (two) times daily. Patient taking differently: Take 20 mg by mouth as needed. 10/25/23   Sowles, Krichna, MD  Insulin  Lispro Prot & Lispro (HUMALOG  MIX 75/25 KWIKPEN) (75-25) 100 UNIT/ML Kwikpen Inject 10-12 Units into the skin 2 (two) times daily. 05/02/24   Sowles, Krichna, MD  irbesartan  (AVAPRO ) 300 MG tablet Take 1 tablet (300 mg total) by mouth daily. 04/12/24    Sowles, Krichna, MD  loratadine  (CLARITIN ) 10 MG tablet TAKE 1 TABLET(10 MG) BY MOUTH DAILY 07/02/24   Sowles, Krichna, MD  pravastatin  (PRAVACHOL ) 80 MG tablet Take 1 tablet (80 mg total) by mouth every evening. 04/12/24   Sowles, Krichna, MD  TRUE METRIX BLOOD GLUCOSE TEST test strip CHECK BLOOD SUGAR FOUR TIMES DAILY 08/02/23   Sowles, Krichna, MD  TRUEplus Lancets 33G MISC TEST BLOOD SUGAR THREE TIMES DAILY AS NEEDED 03/25/23   Glenard Mire, MD    Physical Exam: Vitals:   07/22/24 0002 07/22/24 0422 07/22/24 0451 07/22/24 0829  BP: (!) 147/85 (!) 183/82 (!) 152/67 (!) 150/75  Pulse: 81 98 81 78  Resp: 17 16  16   Temp: 97.6 F (36.4 C) 97.8 F (36.6 C)  97.6 F (36.4 C)  TempSrc:    Oral  SpO2: 100% 100% 97% 100%  Weight:      Height:       Eyes: PERRL, lids and conjunctivae normal ENMT: Mucous membranes are moist. Posterior pharynx clear of any exudate or lesions.Normal dentition.  Neck: normal, supple, no masses, no thyromegaly Respiratory: clear to auscultation bilaterally, no wheezing, no crackles. Normal respiratory effort. No accessory muscle use.  Cardiovascular: Regular rate and rhythm, no murmurs / rubs / gallops. No extremity edema. 2+ pedal pulses. No carotid bruits.  Abdomen: no tenderness, no masses palpated. No hepatosplenomegaly. Bowel sounds positive.  Musculoskeletal: no clubbing / cyanosis. No joint deformity upper and lower extremities. Good ROM, no contractures. Normal muscle tone.  Skin: no rashes, lesions, ulcers. No induration Neurologic: CN 2-12 grossly intact. Sensation intact, DTR normal.  Muscle strength 5/5 on both sides Psychiatric: Normal judgment and insight. Alert and oriented x 3. Normal mood.    Data Reviewed:  Most recent labs including BMP reviewed.  Family Communication: None at bedside Primary team communication: Surgical team Thank you very much for involving us  in the care of your patient.  Author: Cort ONEIDA Mana, MD 07/22/2024 9:27  AM  For on call review [email or web address].

## 2024-07-23 ENCOUNTER — Encounter: Payer: Self-pay | Admitting: General Surgery

## 2024-07-23 DIAGNOSIS — N179 Acute kidney failure, unspecified: Secondary | ICD-10-CM | POA: Diagnosis not present

## 2024-07-23 DIAGNOSIS — K436 Other and unspecified ventral hernia with obstruction, without gangrene: Secondary | ICD-10-CM | POA: Diagnosis not present

## 2024-07-23 DIAGNOSIS — K56609 Unspecified intestinal obstruction, unspecified as to partial versus complete obstruction: Secondary | ICD-10-CM | POA: Diagnosis not present

## 2024-07-23 DIAGNOSIS — N1832 Chronic kidney disease, stage 3b: Secondary | ICD-10-CM | POA: Diagnosis not present

## 2024-07-23 LAB — BASIC METABOLIC PANEL WITH GFR
Anion gap: 9 (ref 5–15)
BUN: 45 mg/dL — ABNORMAL HIGH (ref 8–23)
CO2: 20 mmol/L — ABNORMAL LOW (ref 22–32)
Calcium: 7.9 mg/dL — ABNORMAL LOW (ref 8.9–10.3)
Chloride: 103 mmol/L (ref 98–111)
Creatinine, Ser: 1.75 mg/dL — ABNORMAL HIGH (ref 0.44–1.00)
GFR, Estimated: 30 mL/min — ABNORMAL LOW (ref >60)
Glucose, Bld: 177 mg/dL — ABNORMAL HIGH (ref 70–99)
Potassium: 5.2 mmol/L — ABNORMAL HIGH (ref 3.5–5.1)
Sodium: 133 mmol/L — ABNORMAL LOW (ref 135–145)

## 2024-07-23 LAB — GLUCOSE, CAPILLARY
Glucose-Capillary: 164 mg/dL — ABNORMAL HIGH (ref 70–99)
Glucose-Capillary: 182 mg/dL — ABNORMAL HIGH (ref 70–99)
Glucose-Capillary: 166 mg/dL — ABNORMAL HIGH (ref 70–99)
Glucose-Capillary: 214 mg/dL — ABNORMAL HIGH (ref 70–99)
Glucose-Capillary: 188 mg/dL — ABNORMAL HIGH (ref 70–99)

## 2024-07-23 MED ORDER — SODIUM CHLORIDE 0.9 % IV BOLUS
500.0000 mL | Freq: Once | INTRAVENOUS | Status: AC
  Administered 2024-07-23: 500 mL via INTRAVENOUS

## 2024-07-23 NOTE — Progress Notes (Signed)
 [facility] SURGICAL PROGRESS NOTE  Hospital Day(s): 2.   Post op day(s): 2 Days Post-Op.   Interval History:  Patient seen and examined No acute events or new complaints overnight.  Patient reports she is doing well Abdomen is sore expectedly; pain controlled No fever, chills, nausea, emesis Renal function slightly above baseline; sCr - 1.75; UO - unmeasured Hyperkalemia to 5.2 She is on CLD; no issue No reports of flatus OOB but no ambulation   Vital signs in last 24 hours: [min-max] current  Temp:  [97.8 F (36.6 C)-98.1 F (36.7 C)] 97.8 F (36.6 C) (12/01 0804) Pulse Rate:  [65-92] 65 (12/01 0804) Resp:  [16-19] 17 (12/01 0804) BP: (124-159)/(48-72) 124/48 (12/01 0804) SpO2:  [98 %-100 %] 100 % (12/01 0804)     Height: 5' 10 (177.8 cm) Weight: (!) 152 kg BMI (Calculated): 48.07   Intake/Output last 2 shifts:  11/30 0701 - 12/01 0700 In: 1510.7 [P.O.:380; I.V.:830.7; IV Piggyback:300] Out: 0    Physical Exam:  Constitutional: alert, cooperative and no distress  Respiratory: breathing non-labored at rest  Cardiovascular: regular rate and sinus rhythm  Gastrointestinal: Abdomen is obese, incisional soreness, non-distended, no rebound/guarding. Abdominal binder present   Integumentary: Laparotomy is intact with Prevena; good seal   Labs:     Latest Ref Rng & Units 07/22/2024    4:25 AM 07/21/2024   11:48 AM 04/13/2024    8:26 AM  CBC  WBC 4.0 - 10.5 K/uL 15.7  15.2  7.8   Hemoglobin 12.0 - 15.0 g/dL 89.7  88.6  9.7   Hematocrit 36.0 - 46.0 % 31.8  34.8  30.3   Platelets 150 - 400 K/uL 228  240  191       Latest Ref Rng & Units 07/23/2024    4:26 AM 07/22/2024    3:42 PM 07/22/2024    4:25 AM  CMP  Glucose 70 - 99 mg/dL 822   754   BUN 8 - 23 mg/dL 45   36   Creatinine 9.55 - 1.00 mg/dL 8.24   8.50   Sodium 864 - 145 mmol/L 133   134   Potassium 3.5 - 5.1 mmol/L 5.2  5.6  5.5   Chloride 98 - 111 mmol/L 103   103   CO2 22 - 32 mmol/L 20    20   Calcium  8.9 - 10.3 mg/dL 7.9   8.5     Imaging studies: No new pertinent imaging studies   Assessment/Plan: 78 y.o. female 2 Days Post-Op s/p incarcerated ventral hernia repair with associated small bowel obstruction    - Will advance to FLD: Hold on advancement until bowel function returns  - Monitor renal function; slight bump in sCr - may need bolus; monitor PO intake  - Monitor abdominal examination; on-going bowel function   - Continue Prevena; monitor output - typically for 7 days   - Pain control prn; antiemetics prn  - Morning labs  - Needs to mobilize; PT ordered this AM  - Appreciate medicine consultation; Hold Eliquis  until tomorrow (12/02) to allow 48 hours post-operatively to ensure no bleeding   - Discharge Planning: She is doing well considering, still waiting on ROBF, diet advancing. Potentially home in 24-48 hours pending clinical progression; no rush   All of the above findings and recommendations were discussed with the patient, and the medical team, and all of patient's questions were answered to her expressed satisfaction.  -- Arthea Platt, PA-C [facility] 07/23/2024,  8:45 AM M-F: 7am - 4pm

## 2024-07-23 NOTE — TOC Initial Note (Signed)
 Transition of Care [facility]) - Initial/Assessment Note    Patient Details  Name: Amber Reyes MRN: [id number] Date of Birth: 1945-12-11  [facility] CM/SW Contact:    Alfonso Rummer, LCSW Phone Number: 07/23/2024, 5:13 PM  Clinical Narrative:                 KEN DELENA Rummer completed toc assessment with pt and family at bedside. Pt will discharge to daughter Lynett Brasil home to support [address] [place], [place]. Pt report she uses [facility] pharmacy and adequate family support. Pt is agreeable to home health pt\ot        Patient Goals and CMS Choice            Expected Discharge Plan and Services                                              Prior Living Arrangements/Services                       Activities of Daily Living   ADL Screening (condition at time of admission) Independently performs ADLs?: No Does the patient have a NEW difficulty with bathing/dressing/toileting/self-feeding that is expected to last >3 days?: No Does the patient have a NEW difficulty with getting in/out of bed, walking, or climbing stairs that is expected to last >3 days?: No Does the patient have a NEW difficulty with communication that is expected to last >3 days?: No Is the patient deaf or have difficulty hearing?: No Does the patient have difficulty seeing, even when wearing glasses/contacts?: No Does the patient have difficulty concentrating, remembering, or making decisions?: No  Permission Sought/Granted                  Emotional Assessment              Admission diagnosis:  Partial small bowel obstruction (HCC) [K56.600] Small bowel obstruction (HCC) [K56.609] Incarcerated ventral hernia [K43.6] Patient Active Problem List   Diagnosis Date Noted   Partial small bowel obstruction (HCC) 07/21/2024   Small bowel obstruction (HCC) 07/21/2024   Incarcerated ventral hernia 07/21/2024   Umbilical hernia without  obstruction and without gangrene 04/17/2024   Diastasis recti 04/17/2024   Community acquired pneumonia of right lower lobe of lung 12/27/2023   Anemia due to stage 3a chronic kidney disease (HCC) 10/24/2023   Secondary hyperparathyroidism of renal origin 08/25/2022   Dilation of aorta 08/25/2022   Localized, 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/22/2021   Pain in joint of left shoulder 04/22/2021   Tendonitis of shoulder, left 04/22/2021   Hypertension associated with stage 3 chronic kidney disease due to type 2 diabetes mellitus (HCC) 02/06/2020   History of colonic polyps    Polyp of colon    Thoracic aortic atherosclerosis 12/30/2016   Controlled type 2 diabetes mellitus with microalbuminuria, with long-term current use of insulin  (HCC) 12/23/2016   Venous insufficiency 05/21/2016   Hypertensive heart disease with heart failure (HCC) 05/21/2016   Anemia in chronic kidney disease 12/22/2015   Pulmonary hypertension (HCC) 12/18/2015   Xanthelasma 04/15/2015   Anxiety 04/12/2015   Chronic kidney disease (CKD), stage III (moderate) (HCC) 04/12/2015   Edema of extremities 04/12/2015   Disc disorder of lumbar region 04/12/2015   Asthma, moderate persistent 04/12/2015   Morbid obesity (HCC)  04/12/2015   Obstructive sleep apnea on CPAP 04/12/2015   Restless leg 04/12/2015   Allergic rhinitis 04/12/2015   Vitamin D  deficiency 04/12/2015   Controlled gout 04/12/2015   Premature atrial contractions 11/29/2013   Atrial fibrillation (HCC) 11/09/2013   Benign essential hypertension    Hyperlipidemia    PCP:  Glenard Mire, MD Pharmacy:   [id number] [place], [place] - [address] AT [facility] [address] [facility] [address] [place] [address] Phone: [phone] Fax: [phone]  [facility] - [place], [place] - [address] [place] [address] Phone: [phone] Fax: [phone]  [facility] - [place], [place] - [address] [place] [address] Phone: [phone] Fax: [phone]  [facility] [place] - [address] [place] [address] Phone: [phone] Fax: [phone]  [facility] - [place], [place] - [address] [place] [address] Phone: [phone] Fax: [phone]     Social Drivers of Health (SDOH) Social History: SDOH Screenings   Food Insecurity: No Food Insecurity (07/21/2024)  Housing: Low Risk  (07/21/2024)  Transportation Needs: No Transportation Needs (07/21/2024)  Utilities: Not At Risk (07/21/2024)  Alcohol Screen: Low Risk  (07/07/2023)  Depression (PHQ2-9): Low Risk  (04/20/2024)  Financial Resource Strain: Low Risk  (07/07/2023)  Physical Activity: Inactive (07/07/2023)  Social Connections: Moderately Isolated (07/21/2024)  Stress: No Stress Concern Present (07/07/2023)  Tobacco Use: Low Risk  (07/21/2024)  Health Literacy: Adequate Health Literacy (07/07/2023)   SDOH Interventions:     Readmission Risk Interventions     No data to display

## 2024-07-23 NOTE — Evaluation (Signed)
 Physical Therapy Evaluation Patient Details Name: Amber Reyes MRN: [id number] DOB: 1946/05/21 Today's Date: 07/23/2024  History of Present Illness  Pt is a 78 y/o F admitted on 07/21/24 after presenting with c/o N&V. Pt found to have hernia containing incarcerated bowel. Pt is s/p ventral hernia repair. PMH: morbid obesity, CHF, a-fib on Eliquis , anxiety, HTN, gout, heart murmur, HLD, PAC, restless leg syndrome  Clinical Impression  Pt seen for PT evaluation with pt agreeable, family arriving at end of session. Pt reports prior to admission she was ambulatory with PRN use of SPC, denies falls, limited gait distance 2/2 chronic back pain. On this date, PT introduces log rolling technique, pt with limited ability to perform 2/2 body habitus & small bed size. Pt is able to progress to ambulating in room with RW & CGA, requires elevated seat height for sit>stand transfers. Educated pt on potential to sleep in recliner vs bed at d/c. Daughter arriving, reporting pt will d/c home with her but pt's spouse, sister, other kids can assist. Recommend ongoing PT services to progress gait & for stair negotiation prior to d/c.        If plan is discharge home, recommend the following: A little help with walking and/or transfers;A little help with bathing/dressing/bathroom;Assistance with cooking/housework;Assist for transportation;Help with stairs or ramp for entrance   Can travel by private vehicle        Equipment Recommendations Rolling walker (2 wheels);BSC/3in1 (bariatric)  Recommendations for Other Services  OT consult    Functional Status Assessment Patient has had a recent decline in their functional status and demonstrates the ability to make significant improvements in function in a reasonable and predictable amount of time.     Precautions / Restrictions Precautions Precautions: Fall Precaution/Restrictions Comments: abdominal incision with wound vac Restrictions Weight Bearing  Restrictions Per Provider Order: No      Mobility  Bed Mobility Overal bed mobility: Needs Assistance Bed Mobility: Rolling, Sidelying to Sit, Supine to Sit Rolling: Min assist (difficulty rolling completely L<>R 2/2 body habitus & small bed size, cuing re: technique, use of bed rails) Sidelying to sit: Min assist, HOB elevated, Used rails (exit L side of bed, cuing re: log rolling technique) Supine to sit: Mod assist (assistance to elevate BLE onto bed, decreased ability to demo log rolling)          Transfers Overall transfer level: Needs assistance Equipment used: Rolling walker (2 wheels) Transfers: Sit to/from Stand Sit to Stand: From elevated surface, Contact guard assist           General transfer comment: education re: hand placement to push to standing    Ambulation/Gait Ambulation/Gait assistance: Contact guard assist Gait Distance (Feet): 20 Feet Assistive device: Rolling walker (2 wheels) Gait Pattern/deviations: Decreased step length - left, Decreased step length - right, Decreased stride length Gait velocity: decreased     General Gait Details: Pt takes side steps to L<>R along EOB & able to progress to ambulating to door & back with RW.  Stairs            Wheelchair Mobility     Tilt Bed    Modified Rankin (Stroke Patients Only)       Balance Overall balance assessment: Needs assistance Sitting-balance support: Feet supported Sitting balance-Leahy Scale: Good     Standing balance support: During functional activity, Bilateral upper extremity supported, Reliant on assistive device for balance Standing balance-Leahy Scale: Fair  Pertinent Vitals/Pain Pain Assessment Pain Assessment: 0-10 Pain Score: 7  (at rest, increases with movement) Pain Location: abdominal incision Pain Descriptors / Indicators: Discomfort, Grimacing, Guarding Pain Intervention(s): Monitored during session, Limited  activity within patient's tolerance, Repositioned    Home Living Family/patient expects to be discharged to:: Private residence Living Arrangements: Spouse/significant other Available Help at Discharge: Family Type of Home: House Home Access: Stairs to enter Entrance Stairs-Rails: Left Entrance Stairs-Number of Steps: 2 onto porch + 1 step into house   Home Layout: One level Home Equipment: Shower seat;Cane - single point Additional Comments: Plans to d/c to daughters house (3 steps with R rail to enter).    Prior Function               Mobility Comments: Rolls around kitchen in office chair, limited ambulation distance 2/2 chronic lower back pain, ambulates without AD but PRN use of SPC, no falls in the past 6 months, still driving ADLs Comments: bathes & dresses without assistance, cooks (sitting in rolling office chair), kids assist with cleaning     Extremity/Trunk Assessment   Upper Extremity Assessment Upper Extremity Assessment: Overall WFL for tasks assessed    Lower Extremity Assessment Lower Extremity Assessment: Generalized weakness;Overall WFL for tasks assessed       Communication   Communication Communication: No apparent difficulties    Cognition Arousal: Alert Behavior During Therapy: WFL for tasks assessed/performed   PT - Cognitive impairments: No apparent impairments                         Following commands: Intact       Cueing Cueing Techniques: Verbal cues     General Comments General comments (skin integrity, edema, etc.): PT & daughter assisted with repositioning abdominal binder at end of session. Pt reports lightheadedness at beginning of session (BP 140/56 (76)) with improvement at end of session.    Exercises     Assessment/Plan    PT Assessment Patient needs continued PT services  PT Problem List Decreased strength;Pain;Decreased range of motion;Decreased activity tolerance;Decreased knowledge of use of  DME;Decreased balance;Decreased safety awareness;Decreased mobility;Decreased knowledge of precautions;Obesity       PT Treatment Interventions DME instruction;Balance training;Gait training;Neuromuscular re-education;Stair training;Functional mobility training;Patient/family education;Therapeutic activities;Therapeutic exercise;Manual techniques;Modalities    PT Goals (Current goals can be found in the Care Plan section)  Acute Rehab PT Goals Patient Stated Goal: go home, decreased pain PT Goal Formulation: With patient Time For Goal Achievement: 08/06/24 Potential to Achieve Goals: Good    Frequency Min 3X/week     Co-evaluation               AM-PAC PT 6 Clicks Mobility  Outcome Measure Help needed turning from your back to your side while in a flat bed without using bedrails?: A Little Help needed moving from lying on your back to sitting on the side of a flat bed without using bedrails?: A Lot Help needed moving to and from a bed to a chair (including a wheelchair)?: A Little Help needed standing up from a chair using your arms (e.g., wheelchair or bedside chair)?: A Little Help needed to walk in hospital room?: A Little Help needed climbing 3-5 steps with a railing? : A Lot 6 Click Score: 16    End of Session   Activity Tolerance: Patient tolerated treatment well Patient left: in bed;with call bell/phone within reach;with bed alarm set;with family/visitor present   PT Visit Diagnosis: Pain;Difficulty  in walking, not elsewhere classified (R26.2);Other abnormalities of gait and mobility (R26.89) Pain - part of body:  (abdomen)    Time: 9048-8978 PT Time Calculation (min) (ACUTE ONLY): 30 min   Charges:   PT Evaluation $PT Eval Moderate Complexity: 1 Mod   PT General Charges $$ ACUTE PT VISIT: 1 Visit         Richerd Pinal, PT, DPT 07/23/24, 10:36 AM   Richerd CHRISTELLA Pinal 07/23/2024, 10:34 AM

## 2024-07-23 NOTE — Progress Notes (Signed)
 [facility]  - [place] [facility]   PATIENT NAME: Amber Reyes    MR#:  [id number]  DATE OF BIRTH:  08-Jan-1946  SUBJECTIVE:  daughter and husband at bedside. Earlier fell close disease of blood pressure. Received IV normal saline bolus patient's diet advance to full liquid. Walked with PT earlier.    VITALS:  Blood pressure (!) 134/50, pulse 65, temperature 97.8 F (36.6 C), temperature source Oral, resp. rate 17, height 5' 10 (1.778 m), weight (!) 152 kg, SpO2 100%.  PHYSICAL EXAMINATION:   GENERAL:  78 y.o.-year-old patient with no acute distress. Obese obesity LUNGS: Normal breath sounds bilaterally, no wheezing CARDIOVASCULAR: S1, S2 normal. No murmur   ABDOMEN: postop surgical dressing present EXTREMITIES: No  edema b/l.    NEUROLOGIC: nonfocal  patient is alert and awake SKIN: No obvious rash, lesion, or ulcer.   LABORATORY PANEL:  CBC Recent Labs  Lab 07/22/24 0425  WBC 15.7*  HGB 10.2*  HCT 31.8*  PLT 228    Chemistries  Recent Labs  Lab 07/22/24 0425 07/22/24 1542 07/23/24 0426  NA 134*  --  133*  K 5.5*   < > 5.2*  CL 103  --  103  CO2 20*  --  20*  GLUCOSE 245*  --  177*  BUN 36*  --  45*  CREATININE 1.49*  --  1.75*  CALCIUM  8.5*  --  7.9*  MG 1.9  --   --    < > = values in this interval not displayed.   Cardiac Enzymes No results for input(s): TROPONINI in the last 168 hours. RADIOLOGY:  CT ABDOMEN PELVIS W CONTRAST Result Date: 07/21/2024 CLINICAL DATA:  Nausea vomiting.  Hernia. EXAM: CT ABDOMEN AND PELVIS WITH CONTRAST TECHNIQUE: Multidetector CT imaging of the abdomen and pelvis was performed using the standard protocol following bolus administration of intravenous contrast. RADIATION DOSE REDUCTION: This exam was performed according to the departmental dose-optimization program which includes automated exposure control, adjustment of the mA and/or kV according to patient size and/or use of iterative  reconstruction technique. CONTRAST:  80mL OMNIPAQUE  IOHEXOL  300 MG/ML  SOLN COMPARISON:  04/24/2024. FINDINGS: Lower chest: 2 tiny stable right lower lobe pulmonary nodules. Lung bases otherwise clear. Hepatobiliary: Mild decrease in the liver attenuation suggests fatty infiltration. No liver mass or focal lesion. Normal liver size. Multiple dependent gallstones. No acute cholecystitis. No bile duct dilation. Pancreas: Unremarkable. No pancreatic ductal dilatation or surrounding inflammatory changes. Spleen: Normal in size without focal abnormality. Adrenals/Urinary Tract: Normal adrenal glands. Multiple low-attenuation bilateral renal masses consistent with cysts. No collecting system stones. No hydronephrosis. Normal ureters. Normal bladder. Stomach/Bowel: Distal small bowel enters a ventral hernia. Small bowel proximal to this is mildly dilated with air-fluid levels. Bowel distal to the hernia is decompressed as is the majority of the colon. There is inflammatory stranding and haziness within the herniated fat adjacent to the herniated small bowel. No bowel wall thickening, however. Stomach is unremarkable.  Normal appendix visualized. Vascular/Lymphatic: Aortic atherosclerosis. No aneurysm. No enlarged lymph nodes. Reproductive: Stable uterine calcifications. Small postmenopausal uterus. No adnexal masses. Other: No ascites. Musculoskeletal: No fracture or acute finding.  No bone lesion. IMPRESSION: 1. Partial small bowel obstruction due to small bowel extending into a narrow based ventral hernia the level of the umbilicus. Inflammatory type changes are noted within the herniated fat adjacent to the herniated bowel. 2. No other acute abnormality within the abdomen or pelvis. 3. Aortic atherosclerosis. Stable gallstones and  renal cysts. Probable mild hepatic steatosis. Electronically Signed   By: Alm Parkins M.D.   On: 07/21/2024 14:30    Assessment and Plan Amber Reyes is a 78 y.o. female with past  medical history of HTN, chronic HFpEF, PAF on Eliquis , IDDM, CKD stage IIIb, morbid obesity, gout, presented with incarcerated ventral hernia requiring surgical repair.  Patient tolerated procedure well.  Medical team was asked by surgeon Dr. Marinda to manage patient's baseline health problem including hypertension diabetes CKD.  Patient has no complaints, review of patient recent labs shows persistent hypokalemia K5.5> 5.5   Hyperkalemia CKD stage IIIb - Secondary to ARB, discontinue irbesartan  - Lokelma x 1, continue IV fluid - K 5.5--5.6--5.2 --received IVF   HTN - Discontinue irbesartan  secondary to hyperkalemia - Discontinue HCTZ as patient has history of CKD stage IIIb, expect HCTZ to be less efficient - Start hydralazine  plus Imdur regimen -Continue Cardizem  and Lasix  - Add as needed hydralazine  for possible BP   IDDM with hyperglycemia - Agreed with sliding scale - Add Januvia - Most recent A1c 7.1   Leukocytosis - Probably secondary to incarcerated bowel, no symptoms or signs of peritonitis, status post 3 doses of antibiotics.  Further antibiotics management as per primary team. - Continue to use incentive spirometry, no urinary symptoms.    PAF - Continue Cardizem  - Eliquis  on hold as per Gen surgery till 12/2   Gout - Continue allopurinol    Incarcerated ventral hernia - Status post open hernia repair - now on FLD --PT to work with pt--ambulated in the room  Procedures:Open repair of ventral hernia with mesh, hernia containing incarcerated bowel, hernia measuring 4 cm on 07/22/24 Family communication :Husband and dter    TOTAL TIME TAKING CARE OF THIS PATIENT: 30 minutes.  >50% time spent on counselling and coordination of care  Note: This dictation was prepared with Dragon dictation along with smaller phrase technology. Any transcriptional errors that result from this process are unintentional.  Amber Reyes M.D    [facility]   CC: Primary care  physician; Amber Reyes, Krichna, MD

## 2024-07-23 NOTE — Evaluation (Signed)
 Occupational Therapy Evaluation Patient Details Name: Amber Reyes MRN: [id number] DOB: May 28, 1946 Today's Date: 07/23/2024   History of Present Illness   Pt is a 78 y/o F admitted on 07/21/24 after presenting with c/o N&V. Pt found to have hernia containing incarcerated bowel. Pt is s/p ventral hernia repair. PMH: morbid obesity, CHF, a-fib on Eliquis , anxiety, HTN, gout, heart murmur, HLD, PAC, restless leg syndrome   Clinical Impressions Amber Reyes was seen for OT evaluation this date. Prior to hospital admission, pt was MOD I using SPC as needed, limited household distances. Reports plan to d/c to grand daughters house. Pt currently requires CGA + RW for toilet t/f and ADL t/f ~160 ft. MAX A don B socks in sitting. Reports 7/10 abdominal pain - RN notified. Educated on adapted dressing techniques and log roll. Pt would benefit from skilled OT to address noted impairments and functional limitations (see below for any additional details). Upon hospital discharge, recommend OT follow up.    If plan is discharge home, recommend the following:   A little help with walking and/or transfers;A little help with bathing/dressing/bathroom;Help with stairs or ramp for entrance     Functional Status Assessment   Patient has had a recent decline in their functional status and demonstrates the ability to make significant improvements in function in a reasonable and predictable amount of time.     Equipment Recommendations   BSC/3in1 (Bariatric BSC and RW)     Recommendations for Other Services         Precautions/Restrictions   Precautions Precautions: Fall Recall of Precautions/Restrictions: Intact Precaution/Restrictions Comments: abdominal incision with wound vac Restrictions Weight Bearing Restrictions Per Provider Order: No     Mobility Bed Mobility Overal bed mobility: Needs Assistance Bed Mobility: Supine to Sit     Supine to sit: Min assist           Transfers Overall transfer level: Needs assistance Equipment used: Rolling walker (2 wheels) Transfers: Sit to/from Stand Sit to Stand: Contact guard assist, From elevated surface                  Balance Overall balance assessment: Needs assistance Sitting-balance support: Feet supported Sitting balance-Leahy Scale: Good     Standing balance support: During functional activity, Single extremity supported Standing balance-Leahy Scale: Fair                             ADL either performed or assessed with clinical judgement   ADL Overall ADL's : Needs assistance/impaired                                       General ADL Comments: CGA + RW for toilet t/f and ADL t/f ~160 ft. MAX A don B socks in sitting    Pertinent Vitals/Pain Pain Assessment Pain Assessment: 0-10 Pain Score: 7  Pain Location: abdominal incision Pain Descriptors / Indicators: Discomfort, Grimacing, Guarding Pain Intervention(s): Limited activity within patient's tolerance, Repositioned, Patient requesting pain meds-RN notified     Extremity/Trunk Assessment Upper Extremity Assessment Upper Extremity Assessment: Overall WFL for tasks assessed   Lower Extremity Assessment Lower Extremity Assessment: Generalized weakness       Communication Communication Communication: No apparent difficulties   Cognition Arousal: Alert Behavior During Therapy: WFL for tasks assessed/performed Cognition: No apparent impairments  Following commands: Intact                  Home Living Family/patient expects to be discharged to:: Private residence Living Arrangements: Spouse/significant other Available Help at Discharge: Family Type of Home: House Home Access: Stairs to enter Secretary/administrator of Steps: 3+1 Entrance Stairs-Rails: Right Home Layout: One level     Bathroom Shower/Tub: Tub/shower unit         Home  Equipment: Production Assistant, Radio - single point   Additional Comments: Plans to d/c to daughters house (above)      Prior Functioning/Environment Prior Level of Function : Independent/Modified Independent             Mobility Comments: Rolls around kitchen in office chair, limited ambulation distance 2/2 chronic lower back pain, ambulates without AD but PRN use of SPC, no falls in the past 6 months, still driving ADLs Comments: bathes & dresses without assistance, cooks (sitting in rolling office chair), kids assist with cleaning    OT Problem List: Decreased strength;Decreased activity tolerance;Decreased range of motion;Impaired balance (sitting and/or standing);Decreased safety awareness   OT Treatment/Interventions: Self-care/ADL training;Therapeutic exercise;Energy conservation;DME and/or AE instruction;Therapeutic activities;Patient/family education;Balance training      OT Goals(Current goals can be found in the care plan section)   Acute Rehab OT Goals Patient Stated Goal: to go home OT Goal Formulation: With patient/family Time For Goal Achievement: 08/06/24 Potential to Achieve Goals: Good ADL Goals Pt Will Perform Grooming: with modified independence;standing Pt Will Perform Lower Body Dressing: with modified independence;with caregiver independent in assisting;with adaptive equipment;sit to/from stand Pt Will Transfer to Toilet: with modified independence;ambulating;regular height toilet   OT Frequency:  Min 3X/week    Co-evaluation              AM-PAC OT 6 Clicks Daily Activity     Outcome Measure Help from another person eating meals?: None Help from another person taking care of personal grooming?: A Little Help from another person toileting, which includes using toliet, bedpan, or urinal?: A Little Help from another person bathing (including washing, rinsing, drying)?: A Lot Help from another person to put on and taking off regular upper body clothing?:  A Little Help from another person to put on and taking off regular lower body clothing?: A Lot 6 Click Score: 17   End of Session Equipment Utilized During Treatment: Rolling walker (2 wheels) Nurse Communication: Mobility status;Patient requests pain meds  Activity Tolerance: Patient tolerated treatment well Patient left: in chair;with call bell/phone within reach;with family/visitor present  OT Visit Diagnosis: Other abnormalities of gait and mobility (R26.89);Muscle weakness (generalized) (M62.81)                Time: 1340-1420 OT Time Calculation (min): 40 min Charges:  OT General Charges $OT Visit: 1 Visit OT Evaluation $OT Eval Moderate Complexity: 1 Mod OT Treatments $Self Care/Home Management : 8-22 mins $Therapeutic Activity: 8-22 mins  Elston Slot, M.S. OTR/L  07/23/24, 3:11 PM  ascom [phone]

## 2024-07-23 NOTE — Plan of Care (Signed)
  Problem: Education: Goal: Knowledge of General Education information will improve Description: Including pain rating scale, medication(s)/side effects and non-pharmacologic comfort measures Outcome: Progressing   Problem: Health Behavior/Discharge Planning: Goal: Ability to manage health-related needs will improve Outcome: Progressing   Problem: Clinical Measurements: Goal: Ability to maintain clinical measurements within normal limits will improve Outcome: Progressing Goal: Will remain free from infection Outcome: Progressing Goal: Diagnostic test results will improve Outcome: Progressing Goal: Respiratory complications will improve Outcome: Progressing Goal: Cardiovascular complication will be avoided Outcome: Progressing   Problem: Activity: Goal: Risk for activity intolerance will decrease Outcome: Progressing   Problem: Nutrition: Goal: Adequate nutrition will be maintained Outcome: Progressing   Problem: Coping: Goal: Level of anxiety will decrease Outcome: Progressing   Problem: Elimination: Goal: Will not experience complications related to bowel motility Outcome: Progressing Goal: Will not experience complications related to urinary retention Outcome: Progressing   Problem: Pain Managment: Goal: General experience of comfort will improve and/or be controlled Outcome: Progressing   Problem: Safety: Goal: Ability to remain free from injury will improve Outcome: Progressing   Problem: Education: Goal: Ability to describe self-care measures that may prevent or decrease complications (Diabetes Survival Skills Education) will improve Outcome: Progressing Goal: Individualized Educational Video(s) Outcome: Progressing   Problem: Coping: Goal: Ability to adjust to condition or change in health will improve Outcome: Progressing   Problem: Fluid Volume: Goal: Ability to maintain a balanced intake and output will improve Outcome: Progressing   Problem:  Metabolic: Goal: Ability to maintain appropriate glucose levels will improve Outcome: Progressing   Problem: Nutritional: Goal: Maintenance of adequate nutrition will improve Outcome: Progressing Goal: Progress toward achieving an optimal weight will improve Outcome: Progressing   Problem: Skin Integrity: Goal: Risk for impaired skin integrity will decrease Outcome: Progressing   Problem: Tissue Perfusion: Goal: Adequacy of tissue perfusion will improve Outcome: Progressing

## 2024-07-24 DIAGNOSIS — N1832 Chronic kidney disease, stage 3b: Secondary | ICD-10-CM | POA: Diagnosis not present

## 2024-07-24 DIAGNOSIS — K436 Other and unspecified ventral hernia with obstruction, without gangrene: Secondary | ICD-10-CM | POA: Diagnosis not present

## 2024-07-24 DIAGNOSIS — N179 Acute kidney failure, unspecified: Secondary | ICD-10-CM

## 2024-07-24 DIAGNOSIS — K56609 Unspecified intestinal obstruction, unspecified as to partial versus complete obstruction: Secondary | ICD-10-CM | POA: Diagnosis not present

## 2024-07-24 LAB — BASIC METABOLIC PANEL WITH GFR
Anion gap: 9 (ref 5–15)
BUN: 52 mg/dL — ABNORMAL HIGH (ref 8–23)
CO2: 20 mmol/L — ABNORMAL LOW (ref 22–32)
Calcium: 8.1 mg/dL — ABNORMAL LOW (ref 8.9–10.3)
Chloride: 100 mmol/L (ref 98–111)
Creatinine, Ser: 2.02 mg/dL — ABNORMAL HIGH (ref 0.44–1.00)
GFR, Estimated: 25 mL/min — ABNORMAL LOW (ref >60)
Glucose, Bld: 149 mg/dL — ABNORMAL HIGH (ref 70–99)
Potassium: 5.1 mmol/L (ref 3.5–5.1)
Sodium: 130 mmol/L — ABNORMAL LOW (ref 135–145)

## 2024-07-24 LAB — GLUCOSE, CAPILLARY
Glucose-Capillary: 152 mg/dL — ABNORMAL HIGH (ref 70–99)
Glucose-Capillary: 170 mg/dL — ABNORMAL HIGH (ref 70–99)
Glucose-Capillary: 146 mg/dL — ABNORMAL HIGH (ref 70–99)
Glucose-Capillary: 165 mg/dL — ABNORMAL HIGH (ref 70–99)

## 2024-07-24 LAB — CBC
HCT: 26.1 % — ABNORMAL LOW (ref 36.0–46.0)
Hemoglobin: 8.4 g/dL — ABNORMAL LOW (ref 12.0–15.0)
MCH: 29.4 pg (ref 26.0–34.0)
MCHC: 32.2 g/dL (ref 30.0–36.0)
MCV: 91.3 fL (ref 80.0–100.0)
Platelets: 179 K/uL (ref 150–400)
RBC: 2.86 MIL/uL — ABNORMAL LOW (ref 3.87–5.11)
RDW: 15 % (ref 11.5–15.5)
WBC: 12.6 K/uL — ABNORMAL HIGH (ref 4.0–10.5)
nRBC: 0 % (ref 0.0–0.2)

## 2024-07-24 MED ORDER — APIXABAN 5 MG PO TABS
5.0000 mg | ORAL_TABLET | Freq: Two times a day (BID) | ORAL | Status: DC
  Administered 2024-07-24 – 2024-07-26 (×5): 5 mg via ORAL
  Filled 2024-07-24 (×5): qty 1

## 2024-07-24 MED ORDER — SODIUM CHLORIDE 0.9 % IV SOLN: INTRAVENOUS | Status: DC

## 2024-07-24 MED ORDER — ALBUMIN HUMAN 25 % IV SOLN
25.0000 g | Freq: Once | INTRAVENOUS | Status: AC
  Administered 2024-07-24: 25 g via INTRAVENOUS
  Filled 2024-07-24: qty 100

## 2024-07-24 NOTE — Care Management Important Message (Signed)
 Important Message  Patient Details  Name: Amber Reyes MRN: [id number] Date of Birth: Oct 04, 1945   Important Message Given:  Yes - Medicare IM     Erandy Mceachern W, CMA 07/24/2024, 1:07 PM

## 2024-07-24 NOTE — Progress Notes (Addendum)
  [facility] SUR[facility]ICAL PROGRESS NOTE  Hospital Day(s): 3.   Post op day(s): 3 Days Post-Op.   Interval History:  Patient seen and examined No acute events or new complaints overnight.  Patient reports she is doing well She is feeling well; abdomen is sore  No nausea or emesis  WBC improving from previous labs; now 12.6K Hgb to 8.4 - I do suspect this is dilutional in nature  Continues to have worsening renal function; sCr - 2.02; UO - unmeasured; eGFR 25 She is on FLD; no issues She worked with PT/OT; home health recommendations She is passing flatus   Vital signs in last 24 hours: [min-max] current  Temp:  [97.8 F (36.6 C)-98.2 F (36.8 C)] 98.2 F (36.8 C) (12/02 0450) Pulse Rate:  [65-91] 91 (12/02 0450) Resp:  [16-19] 16 (12/02 0450) BP: (109-134)/(48-91) 119/84 (12/02 0450) SpO2:  [98 %-100 %] 98 % (12/02 0450)     Height: 5' 10 (177.8 cm) Weight: (!) 152 kg BMI (Calculated): 48.07   Intake/Output last 2 shifts:  12/01 0701 - 12/02 0700 In: 960 [P.O.:960] Out: -    Physical Exam:  Constitutional: alert, cooperative and no distress  Respiratory: breathing non-labored at rest  Cardiovascular: regular rate and sinus rhythm  Gastrointestinal: Abdomen is obese, incisional soreness, non-distended, no rebound/guarding. Abdominal binder present   Integumentary: Laparotomy is intact with Prevena; good seal   Labs:     Latest Ref Rng & Units 07/24/2024    4:06 AM 07/22/2024    4:25 AM 07/21/2024   11:48 AM  CBC  WBC 4.0 - 10.5 K/uL 12.6  15.7  15.2   Hemoglobin 12.0 - 15.0 g/dL 8.4  89.7  88.6   Hematocrit 36.0 - 46.0 % 26.1  31.8  34.8   Platelets 150 - 400 K/uL 179  228  240       Latest Ref Rng & Units 07/24/2024    4:06 AM 07/23/2024    4:26 AM 07/22/2024    3:42 PM  CMP  Glucose 70 - 99 mg/dL 850  822    BUN 8 - 23 mg/dL 52  45    Creatinine 9.55 - 1.00 mg/dL 7.97  8.24    Sodium 864 - 145 mmol/L 130  133    Potassium 3.5 - 5.1 mmol/L 5.1   5.2  5.6   Chloride 98 - 111 mmol/L 100  103    CO2 22 - 32 mmol/L 20  20    Calcium  8.9 - 10.3 mg/dL 8.1  7.9      Imaging studies: No new pertinent imaging studies   Assessment/Plan: 78 y.o. female 3 Days Post-Op s/p incarcerated ventral hernia repair with associated small bowel obstruction    - Will advance to soft diet  - Monitor renal function; sCr climbing - Likely injury secondary to surgery and IV contrast from initial CT, hydrate and monitor, discussed with medicine, if elevated tomorrow will engage nephrology   - Monitor abdominal examination; on-going bowel function   - Continue Prevena; monitor output - typically for 7 days (day 3/7)  - Pain control prn; avoid nephrotoxic medications  - Antiemetics prn  - Morning labs; BMP  - Therapies on-board; HH - will order today   - Appreciate medicine consultation; restart Eliquis  today    - Discharge Planning: Doing okay from surgical perspective, renal function worsening slightly. Will keep another 24 hours to ensure this does not continue to worsen.   All of the above findings  and recommendations were discussed with the patient, and the medical team, and all of patient's questions were answered to her expressed satisfaction.  -- Arthea Platt, PA-C  [facility] 07/24/2024, 7:36 AM M-F: 7am - 4pm

## 2024-07-24 NOTE — Progress Notes (Signed)
 [facility]  - [place] at [facility]   PATIENT NAME: Amber Reyes    MR#:  [id number]  DATE OF BIRTH:  1946-05-24  SUBJECTIVE:  No family at bedside.  Tolerating PO diet. Patient walking to the bathroom. Good urine output according to her. I have asked RN to document urine output. Denies nausea vomiting.  VITALS:  Blood pressure (!) 136/57, pulse 69, temperature 98.7 F (37.1 C), temperature source Oral, resp. rate 12, height 5' 10 (1.778 m), weight (!) 152 kg, SpO2 99%.  PHYSICAL EXAMINATION:   GENERAL:  78 y.o.-year-old patient with no acute distress. Obese obesity LUNGS: Normal breath sounds bilaterally CARDIOVASCULAR: S1, S2 normal. No murmur   ABDOMEN: postop surgical dressing present EXTREMITIES: No  edema b/l.    NEUROLOGIC: nonfocal  patient is alert and awake SKIN: No obvious rash, lesion, or ulcer.   LABORATORY PANEL:  CBC Recent Labs  Lab 07/24/24 0406  WBC 12.6*  HGB 8.4*  HCT 26.1*  PLT 179    Chemistries  Recent Labs  Lab 07/22/24 0425 07/22/24 1542 07/24/24 0406  NA 134*   < > 130*  K 5.5*   < > 5.1  CL 103   < > 100  CO2 20*   < > 20*  GLUCOSE 245*   < > 149*  BUN 36*   < > 52*  CREATININE 1.49*   < > 2.02*  CALCIUM  8.5*   < > 8.1*  MG 1.9  --   --    < > = values in this interval not displayed.   Cardiac Enzymes No results for input(s): TROPONINI in the last 168 hours. RADIOLOGY:  No results found.   Assessment and Plan TAREKA JHAVERI is a 78 y.o. female with past medical history of HTN, chronic HFpEF, PAF on Eliquis , IDDM, CKD stage IIIb, morbid obesity, gout, presented with incarcerated ventral hernia requiring surgical repair.  Patient tolerated procedure well.  Medical team was asked by surgeon Dr. Marinda to manage patient's baseline health problem including hypertension diabetes CKD.  Patient has no complaints, review of patient recent labs shows persistent hypokalemia K5.5> 5.5   Hyperkalemia Acute on   CKD stage IIIb, Recent IV contrast exposure (CT abd) - Secondary to ARB, discontinue irbesartan  - Lokelma x 1, continue IV fluid - K 5.5--5.6--5.2 --cont IVF] --baseline creat 1.24--1.4 --pt follows with nephrology at [facility] --Strict I and O --consider Nephrology consult if creat cont to rise   HTN - Discontinue irbesartan  secondary to hyperkalemia - Discontinue HCTZ as patient has history of CKD stage IIIb, expect HCTZ to be less efficient - Start hydralazine  plus Imdur regimen -Continue Cardizem  and Lasix  - Add as needed hydralazine  for possible BP   IDDM with hyperglycemia - Agreed with sliding scale - Add Januvia - Most recent A1c 7.1   Leukocytosis - Probably secondary to incarcerated bowel, no symptoms or signs of peritonitis, status post 3 doses of antibiotics.  Further antibiotics management as per primary team. - Continue to use incentive spirometry, no urinary symptoms.    PAF - Continue Cardizem  - Eliquis  on hold as per Gen surgery till 12/2   Gout - Continue allopurinol    Incarcerated ventral hernia - Status post open hernia repair - now on FLD --PT to work with pt--ambulated in the room  Procedures:Open repair of ventral hernia with mesh, hernia containing incarcerated bowel, hernia measuring 4 cm on 07/22/24 Family communication none today    TOTAL TIME TAKING CARE OF THIS  PATIENT: 30 minutes.  >50% time spent on counselling and coordination of care  Note: This dictation was prepared with Dragon dictation along with smaller phrase technology. Any transcriptional errors that result from this process are unintentional.  Leita Blanch M.D    [facility]   CC: Primary care physician; Sowles, Krichna, MD

## 2024-07-24 NOTE — Progress Notes (Signed)
 Occupational Therapy Treatment Patient Details Name: Amber Reyes MRN: [id number] DOB: [birth year] Today's Date: 07/24/2024   History of present illness Pt is a 78 y/o F admitted on 07/21/24 after presenting with c/o N&V. Pt found to have hernia containing incarcerated bowel. Pt is s/p ventral hernia repair. PMH: morbid obesity, CHF, a-fib on Eliquis , anxiety, HTN, gout, heart murmur, HLD, PAC, restless leg syndrome   OT comments  Chart reviewed to date, pt greeted sitting on edge of bed with PT, agreeable to OT tx session targeting improving functional activity tolerance in prep for ADL tasks. +2 provided for stair negotiation which pt required MIN A +2 and frequent cueing for technique. She performed UB bathing, grooming tasks with SET UP. MIN A required for bed mobility. Pt continues to perform ADL/functional mobility below PLOF, will benefit from acute OT to address deficits and to facilitate opimtal ADL engagement.       If plan is discharge home, recommend the following:  A little help with walking and/or transfers;A little help with bathing/dressing/bathroom;Help with stairs or ramp for entrance   Equipment Recommendations  BSC/3in1 (bariatric bsc and RW)    Recommendations for Other Services      Precautions / Restrictions Precautions Precautions: Fall Recall of Precautions/Restrictions: Intact Precaution/Restrictions Comments: abdominal incision with wound vac Restrictions Weight Bearing Restrictions Per Provider Order: No       Mobility Bed Mobility Overal bed mobility: Needs Assistance Bed Mobility: Rolling, Sit to Sidelying Rolling: Supervision       Sit to sidelying: Min assist (management of BLE)      Transfers Overall transfer level: Needs assistance Equipment used: Rolling walker (2 wheels) Transfers: Sit to/from Stand Sit to Stand: Supervision, Contact guard assist                 Balance Overall balance assessment: Needs  assistance Sitting-balance support: Feet supported, No upper extremity supported Sitting balance-Leahy Scale: Good     Standing balance support: Bilateral upper extremity supported, During functional activity, Reliant on assistive device for balance Standing balance-Leahy Scale: Fair                             ADL either performed or assessed with clinical judgement   ADL Overall ADL's : Needs assistance/impaired Eating/Feeding: Set up   Grooming: Set up   Upper Body Bathing: Set up;Sitting       Upper Body Dressing : Moderate assistance Upper Body Dressing Details (indicate cue type and reason): abominal binder     Toilet Transfer: Contact guard assist;Rolling walker (2 wheels) Toilet Transfer Details (indicate cue type and reason): simulated         Functional mobility during ADLs: Contact guard assist;Rolling walker (2 wheels)      Extremity/Trunk Assessment              Vision       Perception     Praxis     Communication Communication Communication: No apparent difficulties   Cognition Arousal: Alert Behavior During Therapy: WFL for tasks assessed/performed Cognition: No apparent impairments                               Following commands: Intact        Cueing   Cueing Techniques: Verbal cues  Exercises Other Exercises Other Exercises: edu re role of OT, role of rehab, safe ADL completion with  AE/DME    Shoulder Instructions       General Comments SOB with mobility, vss; wound vac intact pre/post session    Pertinent Vitals/ Pain       Pain Assessment Pain Assessment: 0-10 Pain Score: 8  Pain Location: abdomen, lower back Pain Descriptors / Indicators: Discomfort, Grimacing, Guarding Pain Intervention(s): Premedicated before session, Monitored during session, Repositioned  Home Living                                          Prior Functioning/Environment              Frequency   Min 3X/week        Progress Toward Goals  OT Goals(current goals can now be found in the care plan section)  Progress towards OT goals: Progressing toward goals  Acute Rehab OT Goals Time For Goal Achievement: 08/06/24  Plan      Co-evaluation    PT/OT/SLP Co-Evaluation/Treatment:  (overlap for stairs)            AM-PAC OT 6 Clicks Daily Activity     Outcome Measure   Help from another person eating meals?: None Help from another person taking care of personal grooming?: None Help from another person toileting, which includes using toliet, bedpan, or urinal?: A Little Help from another person bathing (including washing, rinsing, drying)?: A Lot Help from another person to put on and taking off regular upper body clothing?: A Little Help from another person to put on and taking off regular lower body clothing?: A Lot 6 Click Score: 18    End of Session Equipment Utilized During Treatment: Rolling walker (2 wheels)  OT Visit Diagnosis: Other abnormalities of gait and mobility (R26.89);Muscle weakness (generalized) (M62.81)   Activity Tolerance Patient tolerated treatment well   Patient Left in bed;with call bell/phone within reach;with bed alarm set   Nurse Communication Mobility status        Time: 8887-8864 OT Time Calculation (min): 23 min  Charges: OT General Charges $OT Visit: 1 Visit OT Treatments $Therapeutic Activity: 8-22 mins  {Connor Foxworthy, OTD OTR/L  07/24/24, 12:54 PM

## 2024-07-24 NOTE — Progress Notes (Signed)
 Physical Therapy Treatment Patient Details Name: Amber Reyes MRN: [id number] DOB: 24-Jul-1946 Today's Date: 07/24/2024   History of Present Illness Pt is a 78 y/o F admitted on 07/21/24 after presenting with c/o N&V. Pt found to have hernia containing incarcerated bowel. Pt is s/p ventral hernia repair. PMH: morbid obesity, CHF, a-fib on Eliquis , anxiety, HTN, gout, heart murmur, HLD, PAC, restless leg syndrome    PT Comments  Pt seen for PT tx with pt agreeable. Pt ambulates in room, bathroom with RW & supervision. Pt progresses to stair negotiation with min assist +2 (overlap with OT) with 1 rail & cuing re: compensatory pattern. Pt fatigued with mobility but reports this is not far off from her baseline. Recommend ongoing PT services to progress mobility as able.  SPO2 >/= 90% on room air Max HR 136 bpm after stair negotiation    If plan is discharge home, recommend the following: A little help with walking and/or transfers;A little help with bathing/dressing/bathroom;Assistance with cooking/housework;Assist for transportation;Help with stairs or ramp for entrance   Can travel by private vehicle        Equipment Recommendations  Rolling walker (2 wheels);BSC/3in1 (bariatric)    Recommendations for Other Services       Precautions / Restrictions Precautions Precautions: Fall Precaution/Restrictions Comments: abdominal incision with wound vac Restrictions Weight Bearing Restrictions Per Provider Order: No     Mobility  Bed Mobility               General bed mobility comments: not tested, pt received & left sitting in recliner    Transfers Overall transfer level: Needs assistance Equipment used: Rolling walker (2 wheels) Transfers: Sit to/from Stand Sit to Stand: Supervision           General transfer comment: sit>stand from elevated EOB, toilet    Ambulation/Gait Ambulation/Gait assistance: Supervision Gait Distance (Feet): 10 Feet (+ 10 ft + 20 ft +  20 ft) Assistive device: Rolling walker (2 wheels) Gait Pattern/deviations: Decreased step length - right, Decreased step length - left, Decreased stride length Gait velocity: decreased     General Gait Details: ambulated in room, bathroom, hallway to stairwell   Stairs Stairs: Yes Stairs assistance: Min assist, +2 safety/equipment Stair Management: One rail Left (R ascending rail, L descending rail) Number of Stairs: 2 (6)     Wheelchair Mobility     Tilt Bed    Modified Rankin (Stroke Patients Only)       Balance Overall balance assessment: Needs assistance Sitting-balance support: Feet supported, No upper extremity supported Sitting balance-Leahy Scale: Good     Standing balance support: During functional activity, Single extremity supported Standing balance-Leahy Scale: Fair                              Hotel Manager: No apparent difficulties  Cognition Arousal: Alert Behavior During Therapy: WFL for tasks assessed/performed   PT - Cognitive impairments: No apparent impairments                         Following commands: Intact      Cueing Cueing Techniques: Verbal cues  Exercises      General Comments General comments (skin integrity, edema, etc.): continent void on toilet, urgency incontinence when walking to the bathroom      Pertinent Vitals/Pain Pain Assessment Pain Assessment: Faces Faces Pain Scale: Hurts even more Pain Location: abdominal incision with  movement Pain Descriptors / Indicators: Discomfort, Grimacing, Guarding Pain Intervention(s): RN gave pain meds during session, Monitored during session, Limited activity within patient's tolerance    Home Living                          Prior Function            PT Goals (current goals can now be found in the care plan section) Acute Rehab PT Goals Patient Stated Goal: go home, decreased pain PT Goal Formulation: With  patient Time For Goal Achievement: 08/06/24 Potential to Achieve Goals: Good Progress towards PT goals: Progressing toward goals    Frequency    Min 3X/week      PT Plan      Co-evaluation              AM-PAC PT 6 Clicks Mobility   Outcome Measure  Help needed turning from your back to your side while in a flat bed without using bedrails?: A Little Help needed moving from lying on your back to sitting on the side of a flat bed without using bedrails?: A Lot Help needed moving to and from a bed to a chair (including a wheelchair)?: A Little Help needed standing up from a chair using your arms (e.g., wheelchair or bedside chair)?: A Little Help needed to walk in hospital room?: A Little Help needed climbing 3-5 steps with a railing? : A Lot 6 Click Score: 16    End of Session   Activity Tolerance: Patient tolerated treatment well Patient left: in bed (sitting EOB in handoff to OT)   PT Visit Diagnosis: Pain;Difficulty in walking, not elsewhere classified (R26.2);Other abnormalities of gait and mobility (R26.89);Muscle weakness (generalized) (M62.81) Pain - part of body:  (abdominal incision)     Time: 8953-8875 PT Time Calculation (min) (ACUTE ONLY): 38 min  Charges:    $Gait Training: 8-22 mins $Therapeutic Activity: 8-22 mins PT General Charges $$ ACUTE PT VISIT: 1 Visit                     Amber Reyes, PT, DPT 07/24/24, 11:30 AM   Amber Reyes 07/24/2024, 11:29 AM

## 2024-07-24 NOTE — Plan of Care (Signed)
  Problem: Education: Goal: Knowledge of General Education information will improve Description: Including pain rating scale, medication(s)/side effects and non-pharmacologic comfort measures Outcome: Progressing   Problem: Health Behavior/Discharge Planning: Goal: Ability to manage health-related needs will improve Outcome: Progressing   Problem: Clinical Measurements: Goal: Ability to maintain clinical measurements within normal limits will improve Outcome: Progressing Goal: Will remain free from infection Outcome: Progressing Goal: Diagnostic test results will improve Outcome: Progressing Goal: Respiratory complications will improve Outcome: Progressing Goal: Cardiovascular complication will be avoided Outcome: Progressing   Problem: Activity: Goal: Risk for activity intolerance will decrease Outcome: Progressing   Problem: Nutrition: Goal: Adequate nutrition will be maintained Outcome: Progressing   Problem: Coping: Goal: Level of anxiety will decrease Outcome: Progressing   Problem: Elimination: Goal: Will not experience complications related to bowel motility Outcome: Progressing Goal: Will not experience complications related to urinary retention Outcome: Progressing   Problem: Skin Integrity: Goal: Risk for impaired skin integrity will decrease Outcome: Progressing   

## 2024-07-25 DIAGNOSIS — I5030 Unspecified diastolic (congestive) heart failure: Secondary | ICD-10-CM | POA: Insufficient documentation

## 2024-07-25 DIAGNOSIS — K56609 Unspecified intestinal obstruction, unspecified as to partial versus complete obstruction: Secondary | ICD-10-CM | POA: Diagnosis not present

## 2024-07-25 LAB — GLUCOSE, CAPILLARY
Glucose-Capillary: 150 mg/dL — ABNORMAL HIGH (ref 70–99)
Glucose-Capillary: 163 mg/dL — ABNORMAL HIGH (ref 70–99)
Glucose-Capillary: 171 mg/dL — ABNORMAL HIGH (ref 70–99)
Glucose-Capillary: 185 mg/dL — ABNORMAL HIGH (ref 70–99)

## 2024-07-25 LAB — COMPREHENSIVE METABOLIC PANEL WITH GFR
ALT: <5 U/L (ref 0–44)
AST: 15 U/L (ref 15–41)
Albumin: 3.2 g/dL — ABNORMAL LOW (ref 3.5–5.0)
Alkaline Phosphatase: 67 U/L (ref 38–126)
Anion gap: 9 (ref 5–15)
BUN: 51 mg/dL — ABNORMAL HIGH (ref 8–23)
CO2: 19 mmol/L — ABNORMAL LOW (ref 22–32)
Calcium: 7.9 mg/dL — ABNORMAL LOW (ref 8.9–10.3)
Chloride: 101 mmol/L (ref 98–111)
Creatinine, Ser: 1.94 mg/dL — ABNORMAL HIGH (ref 0.44–1.00)
GFR, Estimated: 26 mL/min — ABNORMAL LOW (ref >60)
Glucose, Bld: 155 mg/dL — ABNORMAL HIGH (ref 70–99)
Potassium: 4.7 mmol/L (ref 3.5–5.1)
Sodium: 129 mmol/L — ABNORMAL LOW (ref 135–145)
Total Bilirubin: 0.3 mg/dL (ref 0.0–1.2)
Total Protein: 5.8 g/dL — ABNORMAL LOW (ref 6.5–8.1)

## 2024-07-25 NOTE — Progress Notes (Signed)
 PT Cancellation Note  Patient Details Name: BRITENY FULGHUM MRN: [id number] DOB: [birth year]   Cancelled Treatment:     PT attempt. Pt mid transfer back to bed with spouse from [facility]. I'm too tired today and right now.  Can you come back tomorrow?     Rankin KATHEE Essex 07/25/2024, 2:20 PM

## 2024-07-25 NOTE — Consult Note (Addendum)
 [facility]  -  at [facility]     PATIENT NAME: Amber Reyes     MR#:  [id number]   DATE OF BIRTH:  06/07/1946   SUBJECTIVE:   Reports feeling well, no dyspnea, urinating and stooling, tolerating po, abd pain controlled   VITALS:  Blood pressure (!) 136/57, pulse 69, temperature 98.7 F (37.1 C), temperature source Oral, resp. rate 12, height 5' 10 (1.778 m), weight (!) 152 kg, SpO2 99%.   PHYSICAL EXAMINATION:    GENERAL:  78 y.o.-year-old patient with no acute distress. Obese obesity LUNGS: Normal breath sounds bilaterally CARDIOVASCULAR: S1, S2 normal. No murmur   ABDOMEN: midline incision well approximated with staples no significant surrounding erythema EXTREMITIES: trace lower extremity edema NEUROLOGIC: nonfocal  patient is alert and awake SKIN: No obvious rash, lesion, or ulcer.    LABORATORY PANEL:  CBC Last Labs     Recent Labs  Lab 07/24/24 0406  WBC 12.6*  HGB 8.4*  HCT 26.1*  PLT 179        Chemistries  Last Labs       Recent Labs  Lab 07/22/24 0425 07/22/24 1542 07/24/24 0406  NA 134*   < > 130*  K 5.5*   < > 5.1  CL 103   < > 100  CO2 20*   < > 20*  GLUCOSE 245*   < > 149*  BUN 36*   < > 52*  CREATININE 1.49*   < > 2.02*  CALCIUM  8.5*   < > 8.1*  MG 1.9  --   --    < > = values in this interval not displayed.      Cardiac Enzymes Last Labs  No results for input(s): TROPONINI in the last 168 hours.   RADIOLOGY:  Imaging Results (Last 48 hours)  No results found.       Assessment and Plan Amber Reyes is a 78 y.o. female with past medical history of HTN, chronic HFpEF, PAF on Eliquis , IDDM, CKD stage IIIb, morbid obesity, gout, presented with incarcerated ventral hernia requiring surgical repair.  Patient tolerated procedure well.  Medical team was asked by surgeon Dr. Marinda to manage patient's baseline health problem including hypertension diabetes CKD.  Patient has no complaints, review of  patient recent labs shows persistent hypokalemia K5.5> 5.5    Hyperkalemia Acute on  CKD stage IIIb  - arb contributing, discontinued - contrast 11/29 likely also contributing - baseline cr around 1.5 with gfr in the low 30s cr peaked at 2.02, today 1.94 - uop today 1600 -- will hold on additional fluids   HTN - Discontinue irbesartan  secondary to hyperkalemia and aki - hold home chlorthalidone given aki -Continue Cardizem  - Add as needed hydralazine  for possible BP  Hyponatremia Likely 2/2 aki on ckd - trend for now   IDDM with hyperglycemia - Agreed with sliding scale - Add Januvia - Most recent A1c 7.1  HFpEF Home prn lasix  on hold given aki    PAF - Continue Cardizem  - Eliquis  resumed   Gout - Continue allopurinol    Incarcerated ventral hernia - Status post open hernia repair - now on FLD --PT to work with pt--ambulated in the room   Procedures:Open repair of ventral hernia with mesh, hernia containing incarcerated bowel, hernia measuring 4 cm on 07/22/24 Family communication husband at bedside  Devaughn Ban, M.D

## 2024-07-25 NOTE — Plan of Care (Signed)
   Problem: Clinical Measurements: Goal: Ability to maintain clinical measurements within normal limits will improve Outcome: Progressing

## 2024-07-25 NOTE — TOC CM/SW Note (Signed)
 RW: Patient has mobility impairment for daily activities. A rolling walker will resolve this and the patient is safe to use it.

## 2024-07-25 NOTE — TOC Progression Note (Signed)
 Transition of Care [facility]) - Progression Note    Patient Details  Name: Amber Reyes MRN: [id number] Date of Birth: December 15, 1945  Transition of [facility]) CM/SW Contact  Victory Jackquline RAMAN, RN Phone Number: 07/25/2024, 11:15 AM  Clinical Narrative:  RNCM met with the patient at the bedside, introduced myself and my role. Advised her that we would be discussing discharge planning. She is in agreement with HH/PT and would like a RW with the seat. MD and PT made aware. PT responded and said that she is recommending a RW. Patient excepted [facility]. States that at the time of discharge she will be discharging to her daughters house. She lives with her husband but he can't care for her.They will both be staying with their daughter until she gets strong enough to go back home. DME ordered through [employer] (RW and [facility]) to be delivered to the bedside. RNCM will continue to follow for discharge planning.                      Expected Discharge Plan and Services                                               Social Drivers of Health (SDOH) Interventions SDOH Screenings   Food Insecurity: No Food Insecurity (07/21/2024)  Housing: Low Risk  (07/21/2024)  Transportation Needs: No Transportation Needs (07/21/2024)  Utilities: Not At Risk (07/21/2024)  Alcohol Screen: Low Risk  (07/07/2023)  Depression (PHQ2-9): Low Risk  (04/20/2024)  Financial Resource Strain: Low Risk  (07/07/2023)  Physical Activity: Inactive (07/07/2023)  Social Connections: Moderately Isolated (07/21/2024)  Stress: No Stress Concern Present (07/07/2023)  Tobacco Use: Low Risk  (07/21/2024)  Health Literacy: Adequate Health Literacy (07/07/2023)    Readmission Risk Interventions     No data to display

## 2024-07-25 NOTE — Progress Notes (Signed)
 [facility] SURGICAL PROGRESS NOTE  Hospital Day(s): 4.   Post op day(s): 4 Days Post-Op.   Interval History:  Patient seen and examined No acute events or new complaints overnight.  Patient reports she is doing well She is feeling well; abdomen is sore  No nausea or emesis  Renal function improving; sCr - 1.94; UO - 1600 ccs She is on soft diet; no issues She worked with PT/OT; home health recommendations She is passing flatus, had BM  Vital signs in last 24 hours: [min-max] current  Temp:  [98.1 F (36.7 C)-98.8 F (37.1 C)] 98.1 F (36.7 C) (12/03 0444) Pulse Rate:  [70-79] 71 (12/03 0444) Resp:  [16-17] 17 (12/03 0444) BP: (124-138)/(55-59) 138/58 (12/03 0444) SpO2:  [99 %-100 %] 99 % (12/03 0444)     Height: 5' 10 (177.8 cm) Weight: (!) 152 kg BMI (Calculated): 48.07   Intake/Output last 2 shifts:  12/02 0701 - 12/03 0700 In: 1515.7 [P.O.:360; I.V.:1155.7] Out: 1600 [Urine:1600]   Physical Exam:  Constitutional: alert, cooperative and no distress  Respiratory: breathing non-labored at rest  Cardiovascular: regular rate and sinus rhythm  Gastrointestinal: Abdomen is obese, incisional soreness, non-distended, no rebound/guarding. Abdominal binder present   Integumentary: Laparotomy is intact with Prevena (removed); closed with staples, no drainage   Labs:     Latest Ref Rng & Units 07/24/2024    4:06 AM 07/22/2024    4:25 AM 07/21/2024   11:48 AM  CBC  WBC 4.0 - 10.5 K/uL 12.6  15.7  15.2   Hemoglobin 12.0 - 15.0 g/dL 8.4  89.7  88.6   Hematocrit 36.0 - 46.0 % 26.1  31.8  34.8   Platelets 150 - 400 K/uL 179  228  240       Latest Ref Rng & Units 07/25/2024    5:18 AM 07/24/2024    4:06 AM 07/23/2024    4:26 AM  CMP  Glucose 70 - 99 mg/dL 844  850  822   BUN 8 - 23 mg/dL 51  52  45   Creatinine 0.44 - 1.00 mg/dL 8.05  7.97  8.24   Sodium 135 - 145 mmol/L 129  130  133   Potassium 3.5 - 5.1 mmol/L 4.7  5.1  5.2   Chloride 98 - 111 mmol/L  101  100  103   CO2 22 - 32 mmol/L 19  20  20    Calcium  8.9 - 10.3 mg/dL 7.9  8.1  7.9   Total Protein 6.5 - 8.1 g/dL 5.8     Total Bilirubin 0.0 - 1.2 mg/dL 0.3     Alkaline Phos 38 - 126 U/L 67     AST 15 - 41 U/L 15     ALT 0 - 44 U/L <5       Imaging studies: No new pertinent imaging studies   Assessment/Plan: 78 y.o. female 4 Days Post-Op s/p incarcerated ventral hernia repair with associated small bowel obstruction    - Okay to continue diet  - Monitor renal function; sCr improving; okay to continue on fluid another 24 hours and monitor   - Monitor abdominal examination; on-going bowel function   - Prevena removed  - Pain control prn; avoid nephrotoxic medications  - Antiemetics prn  - Morning labs; BMP  - Therapies on-board; HH   - Appreciate medicine consultation; restart Eliquis  today    - Discharge Planning: She is doing well. She is anxious about going home. We will keep another  24 hours for observation and plan to DC home tomorrow (12/04) morning.   All of the above findings and recommendations were discussed with the patient, and the medical team, and all of patient's questions were answered to her expressed satisfaction.  -- Arthea Platt, PA-C [facility] 07/25/2024, 8:09 AM M-F: 7am - 4pm

## 2024-07-25 NOTE — Plan of Care (Signed)

## 2024-07-25 NOTE — TOC CM/SW Note (Signed)
 Patient is not able to walk the distance required to go the bathroom, or he/she is unable to safely negotiate stairs required to access the bathroom.  A 3in1 BSC will alleviate this problem

## 2024-07-26 LAB — BASIC METABOLIC PANEL WITH GFR
Anion gap: 9 (ref 5–15)
BUN: 50 mg/dL — ABNORMAL HIGH (ref 8–23)
CO2: 19 mmol/L — ABNORMAL LOW (ref 22–32)
Calcium: 8 mg/dL — ABNORMAL LOW (ref 8.9–10.3)
Chloride: 102 mmol/L (ref 98–111)
Creatinine, Ser: 1.85 mg/dL — ABNORMAL HIGH (ref 0.44–1.00)
GFR, Estimated: 28 mL/min — ABNORMAL LOW (ref >60)
Glucose, Bld: 153 mg/dL — ABNORMAL HIGH (ref 70–99)
Potassium: 4.7 mmol/L (ref 3.5–5.1)
Sodium: 130 mmol/L — ABNORMAL LOW (ref 135–145)

## 2024-07-26 LAB — GLUCOSE, CAPILLARY
Glucose-Capillary: 163 mg/dL — ABNORMAL HIGH (ref 70–99)
Glucose-Capillary: 203 mg/dL — ABNORMAL HIGH (ref 70–99)

## 2024-07-26 MED ORDER — OXYCODONE HCL 5 MG PO TABS: 5.0000 mg | ORAL_TABLET | ORAL | 0 refills | Status: DC | PRN

## 2024-07-26 NOTE — TOC Transition Note (Signed)
 Transition of Care [facility]) - Discharge Note   Patient Details  Name: Amber Reyes MRN: [id number] Date of Birth: [birth year]  Transition of Care [facility]) CM/SW Contact:  Corean ONEIDA Haddock, RN Phone Number: 07/26/2024, 10:11 AM   Clinical Narrative:      DME orders update to bariatric [facility] and Bariatric rollator.  Mitch with [employer] notified        Patient Goals and CMS Choice            Discharge Placement                       Discharge Plan and Services Additional resources added to the After Visit Summary for                                       Social Drivers of Health (SDOH) Interventions SDOH Screenings   Food Insecurity: No Food Insecurity (07/21/2024)  Housing: Low Risk  (07/21/2024)  Transportation Needs: No Transportation Needs (07/21/2024)  Utilities: Not At Risk (07/21/2024)  Alcohol Screen: Low Risk  (07/07/2023)  Depression (PHQ2-9): Low Risk  (04/20/2024)  Financial Resource Strain: Low Risk  (07/07/2023)  Physical Activity: Inactive (07/07/2023)  Social Connections: Moderately Isolated (07/21/2024)  Stress: No Stress Concern Present (07/07/2023)  Tobacco Use: Low Risk  (07/21/2024)  Health Literacy: Adequate Health Literacy (07/07/2023)     Readmission Risk Interventions     No data to display

## 2024-07-26 NOTE — TOC Transition Note (Signed)
 Transition of Care [facility]) - Discharge Note   Patient Details  Name: Amber Reyes MRN: [id number] Date of Birth: February 24, 1946  Transition of Care [facility]) CM/SW Contact:  Corean ONEIDA Haddock, RN Phone Number: 07/26/2024, 9:17 AM   Clinical Narrative:     Dorothe with [facility] notified of discharge         Patient Goals and CMS Choice            Discharge Placement                       Discharge Plan and Services Additional resources added to the After Visit Summary for                                       Social Drivers of Health (SDOH) Interventions SDOH Screenings   Food Insecurity: No Food Insecurity (07/21/2024)  Housing: Low Risk  (07/21/2024)  Transportation Needs: No Transportation Needs (07/21/2024)  Utilities: Not At Risk (07/21/2024)  Alcohol Screen: Low Risk  (07/07/2023)  Depression (PHQ2-9): Low Risk  (04/20/2024)  Financial Resource Strain: Low Risk  (07/07/2023)  Physical Activity: Inactive (07/07/2023)  Social Connections: Moderately Isolated (07/21/2024)  Stress: No Stress Concern Present (07/07/2023)  Tobacco Use: Low Risk  (07/21/2024)  Health Literacy: Adequate Health Literacy (07/07/2023)     Readmission Risk Interventions     No data to display

## 2024-07-26 NOTE — Progress Notes (Signed)
 Physical Therapy Treatment Patient Details Name: Amber Reyes MRN: [id number] DOB: Jul 10, 1946 Today's Date: 07/26/2024   History of Present Illness Pt is a 78 y/o F admitted on 07/21/24 after presenting with c/o N&V. Pt found to have hernia containing incarcerated bowel. Pt is s/p ventral hernia repair. PMH: morbid obesity, CHF, a-fib on Eliquis , anxiety, HTN, gout, heart murmur, HLD, PAC, restless leg syndrome    PT Comments  Pt was long sitting in bed upon arrival. She requested to get to [facility] for BM. Does require min assist to exit bed and then to stand from slightly elevated bed height.  I'm going to my daughter's house and her bed is adjustable. Pt stood and took a few steps to [facility] prior to standing and ambulating 75 ft with rollator. TOC/MD updated with pt's safe use of rollator. Recommended bariatric rollator and BSC prior to Dcing home later this date. HHPT recommended, to continue to improve pt's strength and activity tolerance.     If plan is discharge home, recommend the following: A little help with walking and/or transfers;A little help with bathing/dressing/bathroom;Assistance with cooking/housework;Assist for transportation;Help with stairs or ramp for entrance     Equipment Recommendations  Rollator (4 wheels);BSC/3in1       Precautions / Restrictions Precautions Precautions: Fall Recall of Precautions/Restrictions: Intact Precaution/Restrictions Comments: abdominal incision with wound vac Restrictions Weight Bearing Restrictions Per Provider Order: No     Mobility  Bed Mobility Overal bed mobility: Needs Assistance Bed Mobility: Rolling, Sidelying to Sit, Supine to Sit Rolling: Supervision Sidelying to sit: Min assist, Used rails (HOB slightly elevated) Supine to sit: Min assist   Transfers Overall transfer level: Needs assistance Equipment used: Rolling walker (2 wheels) Transfers: Sit to/from Stand Sit to Stand: Contact guard assist, Min assist  General  transfer comment: CGA from elevated surface, min assist from elevated height    Ambulation/Gait Ambulation/Gait assistance: Supervision Gait Distance (Feet): 75 Feet Assistive device: Rollator (4 wheels) Gait Pattern/deviations: Step-through pattern Gait velocity: decreased  General Gait Details: pt demonstrated safe abilities to ambulate with rollator without LOB. pt does get SOB/ fatigued quickly. HR stable/sao2 >88%     Balance Overall balance assessment: Needs assistance Sitting-balance support: Feet supported, No upper extremity supported Sitting balance-Leahy Scale: Good     Standing balance support: Bilateral upper extremity supported, During functional activity, Reliant on assistive device for balance Standing balance-Leahy Scale: Fair       Hotel Manager: No apparent difficulties  Cognition Arousal: Alert Behavior During Therapy: WFL for tasks assessed/performed   PT - Cognitive impairments: No apparent impairments    PT - Cognition Comments: Pt is A and O x 4 Following commands: Intact      Cueing Cueing Techniques: Verbal cues, Tactile cues         Pertinent Vitals/Pain Pain Assessment Pain Assessment: 0-10 Pain Location: abdomen, lower back Pain Descriptors / Indicators: Discomfort, Grimacing, Guarding Pain Intervention(s): Limited activity within patient's tolerance, Monitored during session, Premedicated before session, Repositioned     PT Goals (current goals can now be found in the care plan section) Acute Rehab PT Goals Patient Stated Goal: go home, decreased pain Progress towards PT goals: Progressing toward goals    Frequency    Min 3X/week       AM-PAC PT 6 Clicks Mobility   Outcome Measure  Help needed turning from your back to your side while in a flat bed without using bedrails?: A Little Help needed moving from lying on your  back to sitting on the side of a flat bed without using bedrails?: A  Little Help needed moving to and from a bed to a chair (including a wheelchair)?: A Little Help needed standing up from a chair using your arms (e.g., wheelchair or bedside chair)?: A Little Help needed to walk in hospital room?: A Little Help needed climbing 3-5 steps with a railing? : A Little 6 Click Score: 18    End of Session   Activity Tolerance: Patient tolerated treatment well;Patient limited by fatigue Patient left: Other (comment);with call bell/phone within reach;with nursing/sitter in room Nurse Communication: Mobility status PT Visit Diagnosis: Pain;Difficulty in walking, not elsewhere classified (R26.2);Other abnormalities of gait and mobility (R26.89);Muscle weakness (generalized) (M62.81)     Time: 9049-8983 PT Time Calculation (min) (ACUTE ONLY): 26 min  Charges:    $Gait Training: 8-22 mins $Therapeutic Activity: 8-22 mins PT General Charges $$ ACUTE PT VISIT: 1 Visit                    Rankin Essex PTA 07/26/24, 12:51 PM

## 2024-07-27 ENCOUNTER — Inpatient Hospital Stay

## 2024-08-01 ENCOUNTER — Telehealth: Payer: Self-pay

## 2024-08-01 ENCOUNTER — Other Ambulatory Visit: Payer: Self-pay

## 2024-08-01 ENCOUNTER — Telehealth: Payer: Self-pay | Admitting: Family Medicine

## 2024-08-01 DIAGNOSIS — I4891 Unspecified atrial fibrillation: Secondary | ICD-10-CM

## 2024-08-01 MED ORDER — APIXABAN 5 MG PO TABS: 5.0000 mg | ORAL_TABLET | Freq: Two times a day (BID) | ORAL | 1 refills | Status: AC

## 2024-08-01 NOTE — Telephone Encounter (Signed)
 Copied from CRM #[id number]. Topic: Clinical - Home Health Verbal Orders >> Aug 01, 2024  9:26 AM Everette C wrote: Caller/Agency: [place] / [facility]  Callback Number: [phone] Service Requested: Physical Therapy Frequency: 1w8 Any new concerns about the patient? No

## 2024-08-01 NOTE — Telephone Encounter (Signed)
 Pt last saw Dr Darron 11/03/23, last labs 07/26/24 Creat 1.85, age 78, weight 152kg, based on specified criteria pt is on appropriate dosage of Eliquis  5mg  BID for afib.  Will refill rx.

## 2024-08-01 NOTE — Telephone Encounter (Signed)
 Copied from CRM #[id number]. Topic: Clinical - Medication Question >> Aug 01, 2024  1:04 PM Tiffini S wrote: Reason for CRM: [place] with [facility] called about medication interaction for  colchicine  (COLCRYS ) 0.6 MG tablet and diltiazem  (CARDIZEM  CD) 240 MG 24 hr capsule  Please call for update at [phone]

## 2024-08-01 NOTE — Telephone Encounter (Signed)
 Copied from CRM [id number]. Topic: Clinical - Home Health Verbal Orders >> Aug 01, 2024  3:01 PM Edsel HERO wrote: Caller/Agency: [facility] Callback Number: [phone] Service Requested: Occupational Therapy Frequency: 2w1, 1w3 Any new concerns about the patient? No

## 2024-08-01 NOTE — Telephone Encounter (Signed)
 VO given.

## 2024-08-01 NOTE — Telephone Encounter (Signed)
 Verbal given

## 2024-08-02 NOTE — Telephone Encounter (Signed)
Chris notified.

## 2024-08-03 ENCOUNTER — Inpatient Hospital Stay

## 2024-08-03 ENCOUNTER — Inpatient Hospital Stay: Admitting: Oncology

## 2024-08-06 ENCOUNTER — Other Ambulatory Visit

## 2024-08-06 DIAGNOSIS — E1122 Type 2 diabetes mellitus with diabetic chronic kidney disease: Secondary | ICD-10-CM

## 2024-08-06 DIAGNOSIS — Z794 Long term (current) use of insulin: Secondary | ICD-10-CM

## 2024-08-06 DIAGNOSIS — J4540 Moderate persistent asthma, uncomplicated: Secondary | ICD-10-CM

## 2024-08-06 DIAGNOSIS — N183 Chronic kidney disease, stage 3 unspecified: Secondary | ICD-10-CM

## 2024-08-06 MED ORDER — FLUTICASONE-SALMETEROL 250-50 MCG/ACT IN AEPB: 1.0000 | INHALATION_SPRAY | Freq: Two times a day (BID) | RESPIRATORY_TRACT | 1 refills | Status: AC

## 2024-08-06 NOTE — Progress Notes (Deleted)
 S:     Reason for visit: ?  Amber Reyes is a 78 y.o. female with a history of diabetes (type 2), who presents today for a follow up diabetes Telephone pharmacotherapy visit.? Pertinent PMH also includes ***.  Known DM Complications: {DM complications:33329}   Care Team: Primary Care Provider: Sowles, Krichna, MD  At last visit, ***.   Patient reports Diabetes was diagnosed in ***.   Current diabetes medications include: *** Previous diabetes medications include: *** Current hypertension medications include: *** Current hyperlipidemia medications include: ***  Patient reports adherence to taking all medications as prescribed.  *** Patient denies adherence with medications, reports missing *** medications *** times per week, on average.  Have you been experiencing any side effects to the medications prescribed? {YES NO:22349} Do you have any problems obtaining medications due to transportation or finances? {YES [id number] Insurance coverage: ***  Current medication access support: ***  Patient {Actions; denies-reports:120008} hypoglycemic events.  Reported home fasting blood sugars: ***  Reported 2 hour post-meal/random blood sugars: ***.  Patient {Actions; denies-reports:120008} nocturia (nighttime urination).  Patient {Actions; denies-reports:120008} neuropathy (nerve pain). Patient {Actions; denies-reports:120008} visual changes. Patient {Actions; denies-reports:120008} self foot exams.   Patient reported dietary habits: Eats *** meals/day Breakfast: *** Lunch: *** Dinner: *** Snacks: *** Drinks: ***  Patient-reported exercise habits: *** DM Prevention:  Statin: {Blank single:19197::***,Taking,Not taking,Intolerant to,Declines}; {Blank single:19197::low intensity,moderate intensity,high intensity,n/a}.?  ACE/ARB: {Blank single:19197::yes,no}; *** History of chronic kidney disease? {Blank single:19197::yes,no} Last urinary  albumin /creatinine ratio:  Lab Results  Component Value Date   MICRALBCREAT 1.107 10/10/2023   MICRALBCREAT 412 (H) 08/25/2022   MICRALBCREAT 560 (H) 07/14/2021   MICRALBCREAT 78 (H) 05/14/2019   MICRALBCREAT 45 (H) 04/25/2018   MICRALBCREAT 30 (H) 12/23/2016   Last eye exam:  Lab Results  Component Value Date   HMDIABEYEEXA No Retinopathy 05/13/2023   Lab Results  Component Value Date   HMDIABEYEEXA No Retinopathy 05/13/2023   Last foot exam: No foot exam found Tobacco Use:  Tobacco Use: Low Risk (07/21/2024)   Patient History    Smoking Tobacco Use: Never    Smokeless Tobacco Use: Never    Passive Exposure: Never   O:  {CGM reports:33570} 7 day average blood glucose: ***  Libre3 CGM Download today *** % Time CGM is active: ***% Average Glucose: *** mg/dL Glucose Management Indicator: ***  Glucose Variability: ***% (goal <36%) Time in Goal:  - Time in range 70-180: ***% - Time above range: ***% - Time below range: ***% Observed patterns:  Vitals:  Wt Readings from Last 3 Encounters:  07/21/24 (!) 335 lb (152 kg)  07/17/24 (!) 336 lb (152.4 kg)  04/20/24 (!) 349 lb (158.3 kg)   BP Readings from Last 3 Encounters:  07/26/24 125/75  07/17/24 (!) 170/75  04/20/24 (!) 145/52   Pulse Readings from Last 3 Encounters:  07/26/24 66  07/17/24 70  04/20/24 73     Labs:?  Lab Results  Component Value Date   HGBA1C 7.1 (H) 04/12/2024   HGBA1C 7.1 (A) 10/25/2023   HGBA1C 7.2 (A) 06/24/2023   GLUCOSE 153 (H) 07/26/2024   MICRALBCREAT 1.107 10/10/2023   MICRALBCREAT 412 (H) 08/25/2022   MICRALBCREAT 560 (H) 07/14/2021   CREATININE 1.85 (H) 07/26/2024   CREATININE 1.94 (H) 07/25/2024   CREATININE 2.02 (H) 07/24/2024    Lab Results  Component Value Date   CHOL 112 10/25/2023   LDLCALC 46 10/25/2023   LDLCALC 70 08/25/2022   LDLCALC 67 07/14/2021  HDL 52 10/25/2023   TRIG 67 10/25/2023   TRIG 102 08/25/2022   TRIG 78 07/14/2021   ALT <5 07/25/2024    ALT 6 04/12/2024   AST 15 07/25/2024   AST 15 04/12/2024      Chemistry      Component Value Date/Time   NA 130 (L) 07/26/2024 0446   NA 132 (L) 12/25/2023 1153   NA 138 11/09/2013 1137   K 4.7 07/26/2024 0446   K 4.0 11/09/2013 1137   CL 102 07/26/2024 0446   CL 108 (H) 11/09/2013 1137   CO2 19 (L) 07/26/2024 0446   CO2 23 11/09/2013 1137   BUN 50 (H) 07/26/2024 0446   BUN 32 (H) 12/25/2023 1153   BUN 23 (H) 11/09/2013 1137   CREATININE 1.85 (H) 07/26/2024 0446   CREATININE 1.48 (H) 04/12/2024 0849      Component Value Date/Time   CALCIUM  8.0 (L) 07/26/2024 0446   CALCIUM  8.5 11/09/2013 1137   ALKPHOS 67 07/25/2024 0518   AST 15 07/25/2024 0518   ALT <5 07/25/2024 0518   BILITOT 0.3 07/25/2024 0518   BILITOT 0.4 12/18/2015 0911       The ASCVD Risk score (Arnett DK, et al., 2019) failed to calculate for the following reasons:   The valid total cholesterol range is 130 to 320 mg/dL  Lab Results  Component Value Date   MICRALBCREAT 1.107 10/10/2023   MICRALBCREAT 412 (H) 08/25/2022   MICRALBCREAT 560 (H) 07/14/2021   MICRALBCREAT 78 (H) 05/14/2019   MICRALBCREAT 45 (H) 04/25/2018   MICRALBCREAT 30 (H) 12/23/2016    A/P: Diabetes currently *** with a most recent A1c of *** on ***, which is {DESC; UP/DOWN/UNCHANGED:18711} from *** on ***. Patient is *** able to verbalize appropriate hypoglycemia management plan. Medication adherence appears ***. Control is suboptimal due to ***. -{Meds adjust:18428} basal insulin  {basal insulins:33573}  *** units daily.  -{Meds adjust:18428} rapid insulin  {bolus insulin :33574} ***.  -{Meds adjust:18428} GLP-1 {GLP1 options:33572} *** mg .  -{Meds adjust:18428} SGLT2-I {SGLT2i options:33571}*** mg daily.  -{Meds adjust:18428} metformin  ***.  -Patient educated on purpose, proper use, and potential adverse effects of ***.  -Extensively discussed pathophysiology of diabetes, recommended lifestyle interventions, dietary effects on  blood sugar control.  -Counseled on s/sx of and management of hypoglycemia.  -Next A1c anticipated ***.   ASCVD risk - {primary/secondary:33575} prevention in patient with diabetes. Last LDL is *** mg/dL, not at goal of <29 *** mg/dL. ASCVD risk factors include *** and 10-year ASCVD risk score of ***. {Desc; low/moderate/high:110033} intensity statin indicated.  -{Meds adjust:18428} {statin therapies:33576} *** mg daily.  -{Meds adjust:18428} ezetimibe 10 mg daily   Hypertension longstanding *** currently ***. Blood pressure goal of <130/80 *** mmHg. Medication adherence ***. Blood pressure control is suboptimal due to ***. -{Meds adjust:18428} *** mg ***.  {pharmacisttime:33368}  Follow-up:  Pharmacist on *** PCP clinic visit on ***  Peyton CHARLENA Ferries, PharmD, CPP Clinical Pharmacist [facility] [phone]

## 2024-08-06 NOTE — Progress Notes (Signed)
 S:     Reason for visit: ?  Amber Reyes is a 78 y.o. female with a history of diabetes (type 2), who presents today for a follow up diabetes Telephone pharmacotherapy visit.? Pertinent PMH also includes HTN, Afib, aortic atherosclerosis, CKD stage 3b, gout, obesity, HLD.   Care Team: Primary Care Provider: Sowles, Krichna, MD  Patient was admitted to [facility] from 11/29-12/4/25 with incarcerated ventral hernia requiring surgical repair.  Patient reports she did not restart Trulicity  until 07/29/24. Patient previously reports today she decided she does not want to use a continuous glucose monitor and would prefer to remain on current mixed-insulin  therapy. She also reports that she would prefer to remain on Advair, even though there is not patient assistance available.   Current diabetes medications include: Trulicity  3 mg weekly (Sundays), Humalog  75/25 12 units BID Previous diabetes medications include: Farxiga  (hives) Current hypertension medications include: irbesartan  300 mg daily, furosemide  20 mg BID prn, chlorthalidone  12.5 mg daily, diltiazem  240 mg daily  Current hyperlipidemia medications include: pravastatin  80 mg daily   Patient reports adherence to taking all medications as prescribed.   Have you been experiencing any side effects to the medications prescribed? No Do you have any problems obtaining medications due to transportation or finances? no Insurance coverage: [employer]  Current medication access support:  Trulicity  & Humalog  75/25 via [employer]  Patient denies hypoglycemic events.  Reported home fasting blood sugars:  12/4 - 159 12/5 - 185 12/6 - 151 12/7 - 142 (restarted Trulicity ) 12/8 - 141 12/9 - 127 12/10 - 131 12/11 - 119 12/12 - 125 12/13 - 116 12/14 - 108 12/15 - 102  DM Prevention:  Statin: Taking; moderate intensity.?  ACE/ARB: yes; irbesartan  Last urinary albumin /creatinine ratio:  Lab Results  Component Value Date   MICRALBCREAT  1.107 10/10/2023   MICRALBCREAT 412 (H) 08/25/2022   MICRALBCREAT 560 (H) 07/14/2021   MICRALBCREAT 78 (H) 05/14/2019   MICRALBCREAT 45 (H) 04/25/2018   MICRALBCREAT 30 (H) 12/23/2016   Last eye exam:  Lab Results  Component Value Date   HMDIABEYEEXA No Retinopathy 05/13/2023   Lab Results  Component Value Date   HMDIABEYEEXA No Retinopathy 05/13/2023   Last foot exam: No foot exam found Tobacco Use:  Tobacco Use: Low Risk (07/21/2024)   Patient History    Smoking Tobacco Use: Never    Smokeless Tobacco Use: Never    Passive Exposure: Never   O:   Vitals:  Wt Readings from Last 3 Encounters:  07/21/24 (!) 335 lb (152 kg)  07/17/24 (!) 336 lb (152.4 kg)  04/20/24 (!) 349 lb (158.3 kg)   BP Readings from Last 3 Encounters:  07/26/24 125/75  07/17/24 (!) 170/75  04/20/24 (!) 145/52   Pulse Readings from Last 3 Encounters:  07/26/24 66  07/17/24 70  04/20/24 73     Labs:?  Lab Results  Component Value Date   HGBA1C 7.1 (H) 04/12/2024   HGBA1C 7.1 (A) 10/25/2023   HGBA1C 7.2 (A) 06/24/2023   GLUCOSE 153 (H) 07/26/2024   MICRALBCREAT 1.107 10/10/2023   MICRALBCREAT 412 (H) 08/25/2022   MICRALBCREAT 560 (H) 07/14/2021   CREATININE 1.85 (H) 07/26/2024   CREATININE 1.94 (H) 07/25/2024   CREATININE 2.02 (H) 07/24/2024    Lab Results  Component Value Date   CHOL 112 10/25/2023   LDLCALC 46 10/25/2023   LDLCALC 70 08/25/2022   LDLCALC 67 07/14/2021   HDL 52 10/25/2023   TRIG 67 10/25/2023   TRIG  102 08/25/2022   TRIG 78 07/14/2021   ALT <5 07/25/2024   ALT 6 04/12/2024   AST 15 07/25/2024   AST 15 04/12/2024      Chemistry      Component Value Date/Time   NA 130 (L) 07/26/2024 0446   NA 132 (L) 12/25/2023 1153   NA 138 11/09/2013 1137   K 4.7 07/26/2024 0446   K 4.0 11/09/2013 1137   CL 102 07/26/2024 0446   CL 108 (H) 11/09/2013 1137   CO2 19 (L) 07/26/2024 0446   CO2 23 11/09/2013 1137   BUN 50 (H) 07/26/2024 0446   BUN 32 (H) 12/25/2023  1153   BUN 23 (H) 11/09/2013 1137   CREATININE 1.85 (H) 07/26/2024 0446   CREATININE 1.48 (H) 04/12/2024 0849      Component Value Date/Time   CALCIUM  8.0 (L) 07/26/2024 0446   CALCIUM  8.5 11/09/2013 1137   ALKPHOS 67 07/25/2024 0518   AST 15 07/25/2024 0518   ALT <5 07/25/2024 0518   BILITOT 0.3 07/25/2024 0518   BILITOT 0.4 12/18/2015 0911       The ASCVD Risk score (Arnett DK, et al., 2019) failed to calculate for the following reasons:   The valid total cholesterol range is 130 to 320 mg/dL  Lab Results  Component Value Date   MICRALBCREAT 1.107 10/10/2023   MICRALBCREAT 412 (H) 08/25/2022   MICRALBCREAT 560 (H) 07/14/2021   MICRALBCREAT 78 (H) 05/14/2019   MICRALBCREAT 45 (H) 04/25/2018   MICRALBCREAT 30 (H) 12/23/2016    A/P: Diabetes currently controlled with a most recent A1c of 7.1% on 04/12/24 (goal <8% given age and comorbidities). Patient is able to verbalize appropriate hypoglycemia management plan. Fasting BG readings initially elevated post-discharge, however, improved to goal range after restarting Trulicity . Patient denies any hypoglycemia, however, concern with use of mixed insulin  product given age, kidney function, and frequency of BG testing (once daily). Patient declined using CGM for BG monitoring or switching to a basal insulin  product today. She would like to remain on current regimen at this time. -Continued Humalog  75/25 mg  12 units BID -Continued GLP-1 Trulicity  (dulaglutide ) 3 mg weekly . Will check on re-enrollment status  -Patient educated on purpose, proper use, and potential adverse effects of insulin .  -Extensively discussed pathophysiology of diabetes, recommended lifestyle interventions, dietary effects on blood sugar control.  -Counseled on s/sx of and management of hypoglycemia.   ASCVD risk - secondary prevention in patient with diabetes. Last LDL is 46 mg/dL, at goal of <44 mg/dL. -Continued pravastatin  80 mg daily.   Asthma: -  Currently controlled. No reported symptoms at this time.   - Reviewed appropriate inhaler technique. - Patient reports issues with cost of Advair inhaler on insurance, however, would prefer to remain on this inhaler. Explained that no patient assistance is available at this time. Patient vocalized understanding.     Patient verbalized understanding of treatment plan. Total time patient counseling 45 minutes.  Follow-up:  Pharmacist on 11/05/24 PCP clinic visit on 08/29/24  Peyton CHARLENA Ferries, PharmD, CPP Clinical Pharmacist [facility] [phone]

## 2024-08-07 ENCOUNTER — Ambulatory Visit: Admitting: General Surgery

## 2024-08-07 ENCOUNTER — Encounter: Payer: Self-pay | Admitting: General Surgery

## 2024-08-07 VITALS — BP 153/79 | HR 85 | Ht 70.0 in | Wt 341.0 lb

## 2024-08-07 DIAGNOSIS — K436 Other and unspecified ventral hernia with obstruction, without gangrene: Secondary | ICD-10-CM | POA: Diagnosis not present

## 2024-08-07 DIAGNOSIS — Z09 Encounter for follow-up examination after completed treatment for conditions other than malignant neoplasm: Secondary | ICD-10-CM | POA: Diagnosis not present

## 2024-08-07 DIAGNOSIS — K429 Umbilical hernia without obstruction or gangrene: Secondary | ICD-10-CM

## 2024-08-07 NOTE — Telephone Encounter (Signed)
 Received provider portion [employer] (Humalog ) faxed it to [employer] along provider portion,proof of income and Ins card.

## 2024-08-07 NOTE — Telephone Encounter (Signed)
 Brief Telephone Documentation Reason for Call: Patient left message regarding question for pharmacist   Summary of Call: Patient reported receiving a text message stating she was approved for Basaglar  patient assistance through [employer].   Contacted company to check on this as she should only be enrolled in Trulicity  and Humalog  75/25. [employer] rep confirmed enrollment in Trulicity  for 2026 and pending enrollment for Humalog  75/25. No enrollment for Basaglar  at this time.   Follow Up: Patient given direct line for further questions/concerns.  Tyjae Issa E. Marsh, PharmD Clinical Pharmacist [facility] [phone]

## 2024-08-07 NOTE — Telephone Encounter (Signed)
 Filled and faxed provider portion [employer] (Humalog  75/25) to [facility] Allyson pt coming into office to sign.

## 2024-08-07 NOTE — Progress Notes (Signed)
 Outpatient Surgical Follow Up  08/07/2024  Amber Reyes is an 78 y.o. female.   Chief Complaint  Patient presents with   Routine Post Op    HPI: Patient returns status post open repair of incarcerated ventral hernia with mesh.  She reports doing well.  She is tolerating a regular diet.  She is having normal bowel function.  She does report some swelling to her bilateral legs.  Past Medical History:  Diagnosis Date   Allergic rhinitis, cause unspecified    Anxiety    Chronic diastolic CHF (congestive heart failure) (HCC)    CKD (chronic kidney disease), stage III (HCC)    Edema    Essential hypertension, benign    Gout, unspecified    Heart murmur    as child   Hyperlipidemia    Lipoma of unspecified site    Other and unspecified disc disorder of lumbar region    Other general symptoms(780.99)    PAC (premature atrial contraction)    Renal insufficiency    Restless legs syndrome (RLS)    Sebaceous cyst    Super obesity    Type II or unspecified type diabetes mellitus without mention of complication, uncontrolled    Unspecified asthma(493.90)    Unspecified disease of pericardium    Unspecified sleep apnea    Unspecified vitamin D  deficiency    Vaginitis and vulvovaginitis, unspecified    Venous insufficiency     Past Surgical History:  Procedure Laterality Date   CARDIAC CATHETERIZATION  2002   [facility]   CATARACT EXTRACTION W/PHACO Right 09/15/2021   Procedure: CATARACT EXTRACTION PHACO AND INTRAOCULAR LENS PLACEMENT (IOC) RIGHT 10.19 01:01.8;  Surgeon: Jaye Fallow, MD;  Location: [facility];  Service: Ophthalmology;  Laterality: Right;   CATARACT EXTRACTION W/PHACO Left 09/29/2021   Procedure: CATARACT EXTRACTION PHACO AND INTRAOCULAR LENS PLACEMENT (IOC) LEFT DIABETIC 11.40 01:21.2;  Surgeon: Jaye Fallow, MD;  Location: [facility];  Service: Ophthalmology;  Laterality: Left;   COLONOSCOPY WITH PROPOFOL  N/A 09/25/2018   Procedure:  COLONOSCOPY WITH PROPOFOL ;  Surgeon: Therisa Bi, MD;  Location: [facility];  Service: Gastroenterology;  Laterality: N/A;   COLONOSCOPY WITH PROPOFOL  N/A 04/13/2019   Procedure: COLONOSCOPY WITH PROPOFOL ;  Surgeon: Therisa Bi, MD;  Location: [facility] ENDOSCOPY;  Service: Gastroenterology;  Laterality: N/A;   COLONOSCOPY WITH PROPOFOL  N/A 06/27/2020   Procedure: COLONOSCOPY WITH PROPOFOL ;  Surgeon: Therisa Bi, MD;  Location: [facility] ENDOSCOPY;  Service: Gastroenterology;  Laterality: N/A;   COLONOSCOPY WITH PROPOFOL  N/A 08/02/2023   Procedure: COLONOSCOPY WITH PROPOFOL ;  Surgeon: Therisa Bi, MD;  Location: [facility] ENDOSCOPY;  Service: Gastroenterology;  Laterality: N/A;   PERICARDIUM SURGERY     POLYPECTOMY  08/02/2023   Procedure: POLYPECTOMY;  Surgeon: Therisa Bi, MD;  Location: [facility] ENDOSCOPY;  Service: Gastroenterology;;   VENTRAL HERNIA REPAIR N/A 07/21/2024   Procedure: REPAIR, HERNIA, VENTRAL;  Surgeon: Marinda Jayson KIDD, MD;  Location: [facility] ORS;  Service: General;  Laterality: N/A;    Family History  Problem Relation Age of Onset   Heart attack Mother    Hypertension Mother    Bladder Cancer Father    Breast cancer Neg Hx     Social History:  reports that she has never smoked. She has never been exposed to tobacco smoke. She has never used smokeless tobacco. She reports that she does not drink alcohol and does not use drugs.  Allergies: Allergies[1]  Medications reviewed.    ROS Full ROS performed and is otherwise negative other than  what is stated in HPI   BP (!) 153/79   Pulse 85   Ht 5' 10 (1.778 m)   Wt (!) 341 lb (154.7 kg)   SpO2 98%   BMI 48.93 kg/m   Physical Exam Abdomen is soft, obese, nontender and nondistended, midline wound is healing well with staples intact.  She does have 1+ edema bilaterally.    No results found for this or any previous visit (from the past 48 hours). No results found.  Assessment/Plan:  Patient status post ventral hernia  repair with mesh.  It is difficult due to her body habitus to determine if there is any evidence of recurrence although she does not have any bulging and has no obstipation symptoms anymore.  Staples were removed and Steri-Strips were placed.  Continue lifting restrictions for another 2 weeks of no greater than 10 to 15 pounds.  Encouraged her to follow-up with her primary care physician if she continues to have swelling and if she starts to have any other symptoms including shortness of breath.  She can follow-up in our office as needed  A total of 20 minutes was spent reviewing the patient's chart, performing an interval history and physical and discussing treatment options with the patient  Jayson Endow, M.D.  [facility]     [1]  Allergies Allergen Reactions   Farxiga  [Dapagliflozin ] Hives

## 2024-08-07 NOTE — Patient Instructions (Signed)

## 2024-08-14 ENCOUNTER — Other Ambulatory Visit (HOSPITAL_COMMUNITY): Payer: Self-pay

## 2024-08-14 ENCOUNTER — Telehealth: Payer: Self-pay | Admitting: Cardiovascular Disease

## 2024-08-14 NOTE — Telephone Encounter (Signed)
 Called patient to schedule appointment Patient inquiring about patient assistance for eliquis  since she has not heard anything for the new year Please advise

## 2024-08-14 NOTE — Telephone Encounter (Signed)
 Patient has medicare. [employer] Eliquis  assistance requires patient to spend 3% of their income towards prescriptions within the same year they are seeking assistance for. Patient won't be eligible for approval for next year until they prove they have spent 3% of their income on out of pocket drug expenses in 2026.      Eligibility requirements for [employer]MS can be found at [email or web address] Medicare patients won't be eligible until next year once the out of pocket spending requirement has been met. [employer]MS requires patient's to pay some into their deductible before they will consider them for PAP.

## 2024-08-17 ENCOUNTER — Other Ambulatory Visit (HOSPITAL_COMMUNITY): Payer: Self-pay

## 2024-08-17 NOTE — Telephone Encounter (Signed)
 Copied from CRM #[id number]. Topic: General - Other >> Aug 14, 2024  2:34 PM Avram MATSU wrote: Reason for CRM: chris is calling from [employer] [facility] PT is calling with orders to discharged [phone]

## 2024-08-17 NOTE — Telephone Encounter (Signed)
 The patient has been made aware of the requirements for the assistance. She has been advised that once she  reaches the 3% out of pocket, then she can apply for the assistance. She has been advised to reach back out to us  when this occurs or if she needs help prior, we can get her some samples.

## 2024-08-17 NOTE — Telephone Encounter (Signed)
 Per [employer] representative, patient is approved for patient assistance for both Trulicity  and Humalog  75/25 until 08/22/25.  Quentin Strebel E. Marsh, PharmD, CPP Clinical Pharmacist [facility] [phone]

## 2024-08-20 NOTE — Telephone Encounter (Signed)
 Spoke with patient regarding medication assistance. Told her per pharmacy tech, she would not be able to get the assistance for next year until she reaches a 3% deductible for her eliquis  and to keep all of her prescription information to show proof of her 3%. Then she will be able to fill out the assistance form and get us  to fill it out and get it faxed over next year. Will send patient a eliquis  patient assistance form via mail.

## 2024-08-20 NOTE — Telephone Encounter (Addendum)
 Patient is not eligible for renewal through BMS until 3% spending for 2026 has been met. Her drug spending from this year does not count towards the 3% spending requirement for 2026.  [employer]MS PAP requirements can be found at [email or web address]

## 2024-08-20 NOTE — Telephone Encounter (Signed)
 PT filled out her part of the application and is wanting to know what to do next

## 2024-08-20 NOTE — Telephone Encounter (Signed)
 Pt requesting a c/b to get help with her pt assistance application

## 2024-08-20 NOTE — Telephone Encounter (Signed)
 Received approval letter from [employer] (Humalog ) thru 08/22/2025 approval letter index.

## 2024-08-29 ENCOUNTER — Encounter: Payer: Self-pay | Admitting: Family Medicine

## 2024-08-29 ENCOUNTER — Ambulatory Visit: Admitting: Family Medicine

## 2024-08-29 VITALS — BP 134/72 | HR 94 | Resp 16 | Ht 70.0 in | Wt 325.9 lb

## 2024-08-29 DIAGNOSIS — I110 Hypertensive heart disease with heart failure: Secondary | ICD-10-CM

## 2024-08-29 DIAGNOSIS — Z794 Long term (current) use of insulin: Secondary | ICD-10-CM

## 2024-08-29 DIAGNOSIS — I272 Pulmonary hypertension, unspecified: Secondary | ICD-10-CM

## 2024-08-29 DIAGNOSIS — E1122 Type 2 diabetes mellitus with diabetic chronic kidney disease: Secondary | ICD-10-CM

## 2024-08-29 DIAGNOSIS — N1832 Chronic kidney disease, stage 3b: Secondary | ICD-10-CM

## 2024-08-29 DIAGNOSIS — G4733 Obstructive sleep apnea (adult) (pediatric): Secondary | ICD-10-CM

## 2024-08-29 DIAGNOSIS — E1169 Type 2 diabetes mellitus with other specified complication: Secondary | ICD-10-CM

## 2024-08-29 DIAGNOSIS — I129 Hypertensive chronic kidney disease with stage 1 through stage 4 chronic kidney disease, or unspecified chronic kidney disease: Secondary | ICD-10-CM

## 2024-08-29 DIAGNOSIS — Z6841 Body Mass Index (BMI) 40.0 and over, adult: Secondary | ICD-10-CM

## 2024-08-29 DIAGNOSIS — J4540 Moderate persistent asthma, uncomplicated: Secondary | ICD-10-CM

## 2024-08-29 DIAGNOSIS — M109 Gout, unspecified: Secondary | ICD-10-CM

## 2024-08-29 DIAGNOSIS — I482 Chronic atrial fibrillation, unspecified: Secondary | ICD-10-CM

## 2024-08-29 LAB — POCT GLYCOSYLATED HEMOGLOBIN (HGB A1C): Hemoglobin A1C: 7.2 % — AB (ref 4.0–5.6)

## 2024-08-29 NOTE — Progress Notes (Signed)
 Name: Amber Reyes   MRN: [id number]    DOB: February 13, 1946   Date:08/29/2024       Progress Note  Subjective  Chief Complaint  Chief Complaint  Patient presents with   Medical Management of Chronic Issues   Discussed the use of AI scribe software for clinical note transcription with the patient, who gave verbal consent to proceed.  History of Present Illness Amber Reyes is a 79 year old female with diabetes, hypertension, and chronic kidney disease who presents for a four-month follow-up.  She recently underwent surgery for an incarcerated ventral hernia Nov 2025 , after experiencing severe abdominal pain.  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 During her recent hospitalization, her kidney function dropped significantly, with her eGFR decreasing from 42 to 25, and then slightly improving to 28 upon discharge. She is currently taking Trulicity  3 mg and Humalog  Mix 75/25, 10 to 12 units twice a day, before breakfast and dinner. No issues with low blood sugar and she has resumed her regular medication regimen after hospitalization.  Her hypertension and CKD are  managed with abiraterone 300 mg. She does not take SGLT2 inhibitors due to an allergy to Farxiga . For dyslipidemia, she takes pravastatin  80 mg without issues. She also has hypertensive heart disease with heart failure and is seeing a cardiologist. She takes chlorthalidone  for blood pressure and fluid management, and Lasix  as needed.  She has chronic atrial fibrillation, managed with Eliquis  5 mg twice a day and Cardizem  CD 240 mg once a day. No palpitations or shortness of breath. For her congestive heart failure, she does not experience shortness of breath when lying flat and uses . She has pulmonary HTN and OSA and is compliant with CPAP machine . She states CPAP helps her feel better.   She has a history of morbid obesity, with a recent weight  loss from 341 lbs in December to 325.9 lbs today. She attributes this to cutting back on food intake and monitoring her diet. She also has moderate persistent asthma, controlled with inhalers, and reports no current symptoms of shortness of breath or wheezing.  She has a history of gout, which is currently asymptomatic, and iron  deficiency anemia, for which she sees a hematologist. She missed a recent appointment due to her surgery but plans to reschedule. She uses Advair for asthma management.  She uses a cane for stability due to weakness and back pain, which has improved since her surgery. She has been trying to walk more at home and uses a walker with a bench for longer distances. No current back pain.    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   Localized, 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/22/2021   Pain in joint of left shoulder 04/22/2021   Tendonitis of shoulder, left 04/22/2021   Hypertension associated with stage 3 chronic kidney disease due to type 2 diabetes mellitus (HCC) 02/06/2020   History of colonic polyps    Polyp of colon  Thoracic aortic atherosclerosis 12/30/2016   Controlled type 2 diabetes mellitus with microalbuminuria, with long-term current use of insulin  (HCC) 12/23/2016   Venous insufficiency 05/21/2016   Hypertensive heart disease with heart failure (HCC) 05/21/2016   Anemia in chronic kidney disease 12/22/2015   Pulmonary hypertension (HCC) 12/18/2015   Xanthelasma 04/15/2015    Anxiety 04/12/2015   CKD stage 3b, GFR 30-44 ml/min (HCC) 04/12/2015   Edema of extremities 04/12/2015   Disc disorder of lumbar region 04/12/2015   Asthma, moderate persistent 04/12/2015   Morbid obesity (HCC) 04/12/2015   Obstructive sleep apnea on CPAP 04/12/2015   Restless leg 04/12/2015   Allergic rhinitis 04/12/2015   Vitamin D  deficiency 04/12/2015   Controlled gout 04/12/2015   Premature atrial contractions 11/29/2013   Atrial fibrillation (HCC) 11/09/2013   Benign essential hypertension    Hyperlipidemia     Past Surgical History:  Procedure Laterality Date   CARDIAC CATHETERIZATION  2002   [facility]   CATARACT EXTRACTION W/PHACO Right 09/15/2021   Procedure: CATARACT EXTRACTION PHACO AND INTRAOCULAR LENS PLACEMENT (IOC) RIGHT 10.19 01:01.8;  Surgeon: Jaye Fallow, MD;  Location: [facility];  Service: Ophthalmology;  Laterality: Right;   CATARACT EXTRACTION W/PHACO Left 09/29/2021   Procedure: CATARACT EXTRACTION PHACO AND INTRAOCULAR LENS PLACEMENT (IOC) LEFT DIABETIC 11.40 01:21.2;  Surgeon: Jaye Fallow, MD;  Location: [facility];  Service: Ophthalmology;  Laterality: Left;   COLONOSCOPY WITH PROPOFOL  N/A 09/25/2018   Procedure: COLONOSCOPY WITH PROPOFOL ;  Surgeon: Therisa Bi, MD;  Location: [facility] ENDOSCOPY;  Service: Gastroenterology;  Laterality: N/A;   COLONOSCOPY WITH PROPOFOL  N/A 04/13/2019   Procedure: COLONOSCOPY WITH PROPOFOL ;  Surgeon: Therisa Bi, MD;  Location: [facility] ENDOSCOPY;  Service: Gastroenterology;  Laterality: N/A;   COLONOSCOPY WITH PROPOFOL  N/A 06/27/2020   Procedure: COLONOSCOPY WITH PROPOFOL ;  Surgeon: Therisa Bi, MD;  Location: [facility] ENDOSCOPY;  Service: Gastroenterology;  Laterality: N/A;   COLONOSCOPY WITH PROPOFOL  N/A 08/02/2023   Procedure: COLONOSCOPY WITH PROPOFOL ;  Surgeon: Therisa Bi, MD;  Location: [facility] ENDOSCOPY;  Service: Gastroenterology;  Laterality: N/A;   PERICARDIUM SURGERY     POLYPECTOMY  08/02/2023   Procedure:  POLYPECTOMY;  Surgeon: Therisa Bi, MD;  Location: [facility] ENDOSCOPY;  Service: Gastroenterology;;   VENTRAL HERNIA REPAIR N/A 07/21/2024   Procedure: REPAIR, HERNIA, VENTRAL;  Surgeon: Marinda Jayson KIDD, MD;  Location: [facility] ORS;  Service: General;  Laterality: N/A;    Family History  Problem Relation Age of Onset   Heart attack Mother    Hypertension Mother    Bladder Cancer Father    Breast cancer Neg Hx     Social History   Tobacco Use   Smoking status: Never    Passive exposure: Never   Smokeless tobacco: Never   Tobacco comments:    n/a  Substance Use Topics   Alcohol use: No    Alcohol/week: 0.0 standard drinks of alcohol    Current Medications[1]  Allergies[2]  I personally reviewed active problem list, medication list, allergies, family history with the patient/caregiver today.   ROS  Ten systems reviewed and is negative except as mentioned in HPI    Objective Physical Exam  CONSTITUTIONAL: Patient appears well-developed and well-nourished. No distress. HEENT: Head atraumatic, normocephalic, neck supple. CARDIOVASCULAR: Irregular rhythm, rate controlled. No BLE edema. PULMONARY: Effort normal and breath sounds normal. No respiratory distress. ABDOMINAL: There is no tenderness or distention. MUSCULOSKELETAL: slow gait, using a cane PSYCHIATRIC: Patient has a normal mood and affect. Behavior is normal. Judgment and thought content  normal. NEUROLOGICAL: Normal reflexes. SKIN: Surgical site healing well with a small scab near the umbilicus.  Vitals:   08/29/24 0820  BP: 134/72  Pulse: 94  Resp: 16  SpO2: 99%  Weight: (!) 325 lb 14.4 oz (147.8 kg)  Height: 5' 10 (1.778 m)    Body mass index is 46.76 kg/m.  Recent Results (from the past 2160 hours)  CBC     Status: Abnormal   Collection Time: 07/21/24 11:48 AM  Result Value Ref Range   WBC 15.2 (H) 4.0 - 10.5 K/uL   RBC 3.85 (L) 3.87 - 5.11 MIL/uL   Hemoglobin 11.3 (L) 12.0 - 15.0 g/dL   HCT 65.1  (L) 63.9 - 46.0 %   MCV 90.4 80.0 - 100.0 fL   MCH 29.4 26.0 - 34.0 pg   MCHC 32.5 30.0 - 36.0 g/dL   RDW 84.5 88.4 - 84.4 %   Platelets 240 150 - 400 K/uL   nRBC 0.0 0.0 - 0.2 %    Comment: Performed at [facility], [address] [place], [place] [address]  Basic metabolic panel     Status: Abnormal   Collection Time: 07/21/24 11:48 AM  Result Value Ref Range   Sodium 134 (L) 135 - 145 mmol/L   Potassium 5.5 (H) 3.5 - 5.1 mmol/L   Chloride 101 98 - 111 mmol/L   CO2 22 22 - 32 mmol/L   Glucose, Bld 203 (H) 70 - 99 mg/dL    Comment: Glucose reference range applies only to samples taken after fasting for at least 8 hours.   BUN 35 (H) 8 - 23 mg/dL   Creatinine, Ser 8.69 (H) 0.44 - 1.00 mg/dL   Calcium  9.4 8.9 - 10.3 mg/dL   GFR, Estimated 42 (L) >60 mL/min    Comment: (NOTE) Calculated using the CKD-EPI Creatinine Equation (2021)    Anion gap 11 5 - 15    Comment: Performed at [facility], [address] [place], [place] [address]  Lipase, blood     Status: None   Collection Time: 07/21/24 11:48 AM  Result Value Ref Range   Lipase 24 11 - 51 U/L    Comment: Performed at [facility], [address] [place], [place] [address]  Resp panel by RT-PCR (RSV, Flu A&B, Covid) Anterior Nasal Swab     Status: None   Collection Time: 07/21/24 11:49 AM   Specimen: Anterior Nasal Swab  Result Value Ref Range   SARS Coronavirus 2 by RT PCR NEGATIVE NEGATIVE    Comment: (NOTE) SARS-CoV-2 target nucleic acids are NOT DETECTED.  The SARS-CoV-2 RNA is generally detectable in upper respiratory specimens during the acute phase of infection. The lowest concentration of SARS-CoV-2 viral copies this assay can detect is 138 copies/mL. A negative result does not preclude SARS-Cov-2 infection and should not be used as the sole basis for treatment or other patient management decisions. A negative result may occur with  improper specimen collection/handling,  submission of specimen other than nasopharyngeal swab, presence of viral mutation(s) within the areas targeted by this assay, and inadequate number of viral copies(<138 copies/mL). A negative result must be combined with clinical observations, patient history, and epidemiological information. The expected result is Negative.  Fact Sheet for Patients:  bloggercourse.com  Fact Sheet for Healthcare Providers:  seriousbroker.it  This test is no t yet approved or cleared by the [place]  FDA and  has been authorized for detection and/or diagnosis of SARS-CoV-2 by FDA  under an Emergency Use Authorization (EUA). This EUA will remain  in effect (meaning this test can be used) for the duration of the COVID-19 declaration under Section 564(b)(1) of the Act, 21 U.S.C.section 360bbb-3(b)(1), unless the authorization is terminated  or revoked sooner.       Influenza A by PCR NEGATIVE NEGATIVE   Influenza B by PCR NEGATIVE NEGATIVE    Comment: (NOTE) The Xpert Xpress SARS-CoV-2/FLU/RSV plus assay is intended as an aid in the diagnosis of influenza from Nasopharyngeal swab specimens and should not be used as a sole basis for treatment. Nasal washings and aspirates are unacceptable for Xpert Xpress SARS-CoV-2/FLU/RSV testing.  Fact Sheet for Patients: bloggercourse.com  Fact Sheet for Healthcare Providers: seriousbroker.it  This test is not yet approved or cleared by the [place]  FDA and has been authorized for detection and/or diagnosis of SARS-CoV-2 by FDA under an Emergency Use Authorization (EUA). This EUA will remain in effect (meaning this test can be used) for the duration of the COVID-19 declaration under Section 564(b)(1) of the Act, 21 U.S.C. section 360bbb-3(b)(1), unless the authorization is terminated or revoked.     Resp Syncytial Virus by PCR NEGATIVE NEGATIVE     Comment: (NOTE) Fact Sheet for Patients: bloggercourse.com  Fact Sheet for Healthcare Providers: seriousbroker.it  This test is not yet approved or cleared by the [place]  FDA and has been authorized for detection and/or diagnosis of SARS-CoV-2 by FDA under an Emergency Use Authorization (EUA). This EUA will remain in effect (meaning this test can be used) for the duration of the COVID-19 declaration under Section 564(b)(1) of the Act, 21 U.S.C. section 360bbb-3(b)(1), unless the authorization is terminated or revoked.  Performed at [facility], [address] [place], [place] [address]   Urinalysis, Routine w reflex microscopic -Urine, Clean Catch     Status: Abnormal   Collection Time: 07/21/24  2:26 PM  Result Value Ref Range   Color, Urine YELLOW (A) YELLOW   APPearance CLEAR (A) CLEAR   Specific Gravity, Urine 1.020 1.005 - 1.030   pH 5.0 5.0 - 8.0   Glucose, UA NEGATIVE NEGATIVE mg/dL   Hgb urine dipstick SMALL (A) NEGATIVE   Bilirubin Urine NEGATIVE NEGATIVE   Ketones, ur NEGATIVE NEGATIVE mg/dL   Protein, ur >=699 (A) NEGATIVE mg/dL   Nitrite NEGATIVE NEGATIVE   Leukocytes,Ua NEGATIVE NEGATIVE   RBC / HPF 0-5 0 - 5 RBC/hpf   WBC, UA 0-5 0 - 5 WBC/hpf   Bacteria, UA RARE (A) NONE SEEN   Squamous Epithelial / HPF 0-5 0 - 5 /HPF   Mucus PRESENT     Comment: Performed at [facility], [address] [place], [place] [address]  Glucose, capillary     Status: Abnormal   Collection Time: 07/21/24  4:11 PM  Result Value Ref Range   Glucose-Capillary 164 (H) 70 - 99 mg/dL    Comment: Glucose reference range applies only to samples taken after fasting for at least 8 hours.  Glucose, capillary     Status: Abnormal   Collection Time: 07/21/24  6:28 PM  Result Value Ref Range   Glucose-Capillary 153 (H) 70 - 99 mg/dL    Comment: Glucose reference range applies only to samples taken after fasting for  at least 8 hours.  Glucose, capillary     Status: Abnormal   Collection Time: 07/21/24  9:05 PM  Result Value Ref Range   Glucose-Capillary 184 (H) 70 - 99 mg/dL  Comment: Glucose reference range applies only to samples taken after fasting for at least 8 hours.  Basic metabolic panel     Status: Abnormal   Collection Time: 07/22/24  4:25 AM  Result Value Ref Range   Sodium 134 (L) 135 - 145 mmol/L   Potassium 5.5 (H) 3.5 - 5.1 mmol/L   Chloride 103 98 - 111 mmol/L   CO2 20 (L) 22 - 32 mmol/L   Glucose, Bld 245 (H) 70 - 99 mg/dL    Comment: Glucose reference range applies only to samples taken after fasting for at least 8 hours.   BUN 36 (H) 8 - 23 mg/dL   Creatinine, Ser 8.50 (H) 0.44 - 1.00 mg/dL   Calcium  8.5 (L) 8.9 - 10.3 mg/dL   GFR, Estimated 36 (L) >60 mL/min    Comment: (NOTE) Calculated using the CKD-EPI Creatinine Equation (2021)    Anion gap 11 5 - 15    Comment: Performed at [facility], [address] [place], [place] [address]  CBC     Status: Abnormal   Collection Time: 07/22/24  4:25 AM  Result Value Ref Range   WBC 15.7 (H) 4.0 - 10.5 K/uL   RBC 3.49 (L) 3.87 - 5.11 MIL/uL   Hemoglobin 10.2 (L) 12.0 - 15.0 g/dL   HCT 68.1 (L) 63.9 - 53.9 %   MCV 91.1 80.0 - 100.0 fL   MCH 29.2 26.0 - 34.0 pg   MCHC 32.1 30.0 - 36.0 g/dL   RDW 84.6 88.4 - 84.4 %   Platelets 228 150 - 400 K/uL   nRBC 0.0 0.0 - 0.2 %    Comment: Performed at [facility], [address] [place], [place] [address]  Magnesium     Status: None   Collection Time: 07/22/24  4:25 AM  Result Value Ref Range   Magnesium 1.9 1.7 - 2.4 mg/dL    Comment: Performed at [facility], [address] [place], [place] [address]  Phosphorus     Status: None   Collection Time: 07/22/24  4:25 AM  Result Value Ref Range   Phosphorus 4.2 2.5 - 4.6 mg/dL    Comment: Performed at [facility], [address] [place], [place] [address]  Glucose, capillary     Status:  Abnormal   Collection Time: 07/22/24  8:29 AM  Result Value Ref Range   Glucose-Capillary 198 (H) 70 - 99 mg/dL    Comment: Glucose reference range applies only to samples taken after fasting for at least 8 hours.  Glucose, capillary     Status: Abnormal   Collection Time: 07/22/24 11:51 AM  Result Value Ref Range   Glucose-Capillary 221 (H) 70 - 99 mg/dL    Comment: Glucose reference range applies only to samples taken after fasting for at least 8 hours.  Potassium     Status: Abnormal   Collection Time: 07/22/24  3:42 PM  Result Value Ref Range   Potassium 5.6 (H) 3.5 - 5.1 mmol/L    Comment: Performed at [facility], [address] [place], [place] [address]  Glucose, capillary     Status: Abnormal   Collection Time: 07/22/24  4:28 PM  Result Value Ref Range   Glucose-Capillary 204 (H) 70 - 99 mg/dL    Comment: Glucose reference range applies only to samples taken after fasting for at least 8 hours.  Glucose, capillary     Status: Abnormal   Collection Time: 07/22/24  9:58 PM  Result Value Ref Range   Glucose-Capillary 183 (H) 70 - 99 mg/dL    Comment: Glucose reference range applies only to samples taken after fasting for at least 8 hours.  Basic metabolic panel     Status: Abnormal   Collection Time: 07/23/24  4:26 AM  Result Value Ref Range   Sodium 133 (L) 135 - 145 mmol/L   Potassium 5.2 (H) 3.5 - 5.1 mmol/L   Chloride 103 98 - 111 mmol/L   CO2 20 (L) 22 - 32 mmol/L   Glucose, Bld 177 (H) 70 - 99 mg/dL    Comment: Glucose reference range applies only to samples taken after fasting for at least 8 hours.   BUN 45 (H) 8 - 23 mg/dL   Creatinine, Ser 8.24 (H) 0.44 - 1.00 mg/dL   Calcium  7.9 (L) 8.9 - 10.3 mg/dL   GFR, Estimated 30 (L) >60 mL/min    Comment: (NOTE) Calculated using the CKD-EPI Creatinine Equation (2021)    Anion gap 9 5 - 15    Comment: Performed at [facility], [address] [place], [place] [address]  Glucose, capillary      Status: Abnormal   Collection Time: 07/23/24  8:06 AM  Result Value Ref Range   Glucose-Capillary 182 (H) 70 - 99 mg/dL    Comment: Glucose reference range applies only to samples taken after fasting for at least 8 hours.  Glucose, capillary     Status: Abnormal   Collection Time: 07/23/24 11:41 AM  Result Value Ref Range   Glucose-Capillary 166 (H) 70 - 99 mg/dL    Comment: Glucose reference range applies only to samples taken after fasting for at least 8 hours.  Glucose, capillary     Status: Abnormal   Collection Time: 07/23/24  4:57 PM  Result Value Ref Range   Glucose-Capillary 214 (H) 70 - 99 mg/dL    Comment: Glucose reference range applies only to samples taken after fasting for at least 8 hours.  Glucose, capillary     Status: Abnormal   Collection Time: 07/23/24  9:14 PM  Result Value Ref Range   Glucose-Capillary 188 (H) 70 - 99 mg/dL    Comment: Glucose reference range applies only to samples taken after fasting for at least 8 hours.  CBC     Status: Abnormal   Collection Time: 07/24/24  4:06 AM  Result Value Ref Range   WBC 12.6 (H) 4.0 - 10.5 K/uL   RBC 2.86 (L) 3.87 - 5.11 MIL/uL   Hemoglobin 8.4 (L) 12.0 - 15.0 g/dL   HCT 73.8 (L) 63.9 - 53.9 %   MCV 91.3 80.0 - 100.0 fL   MCH 29.4 26.0 - 34.0 pg   MCHC 32.2 30.0 - 36.0 g/dL   RDW 84.9 88.4 - 84.4 %   Platelets 179 150 - 400 K/uL   nRBC 0.0 0.0 - 0.2 %    Comment: Performed at [facility], [address] [place], [place] [address]  Basic metabolic panel with GFR     Status: Abnormal   Collection Time: 07/24/24  4:06 AM  Result Value Ref Range   Sodium 130 (L) 135 - 145 mmol/L   Potassium 5.1 3.5 - 5.1 mmol/L   Chloride 100 98 - 111 mmol/L   CO2 20 (L) 22 - 32 mmol/L   Glucose, Bld 149 (H) 70 - 99 mg/dL    Comment: Glucose reference range applies only to samples taken after fasting for at least 8  hours.   BUN 52 (H) 8 - 23 mg/dL   Creatinine, Ser 7.97 (H) 0.44 - 1.00 mg/dL   Calcium  8.1 (L) 8.9  - 10.3 mg/dL   GFR, Estimated 25 (L) >60 mL/min    Comment: (NOTE) Calculated using the CKD-EPI Creatinine Equation (2021)    Anion gap 9 5 - 15    Comment: Performed at [facility], [address] [place], [place] [address]  Glucose, capillary     Status: Abnormal   Collection Time: 07/24/24  8:24 AM  Result Value Ref Range   Glucose-Capillary 152 (H) 70 - 99 mg/dL    Comment: Glucose reference range applies only to samples taken after fasting for at least 8 hours.   Comment 1 Notify RN    Comment 2 Document in Chart   Glucose, capillary     Status: Abnormal   Collection Time: 07/24/24 11:45 AM  Result Value Ref Range   Glucose-Capillary 170 (H) 70 - 99 mg/dL    Comment: Glucose reference range applies only to samples taken after fasting for at least 8 hours.   Comment 1 Notify RN    Comment 2 Document in Chart   Glucose, capillary     Status: Abnormal   Collection Time: 07/24/24  5:32 PM  Result Value Ref Range   Glucose-Capillary 146 (H) 70 - 99 mg/dL    Comment: Glucose reference range applies only to samples taken after fasting for at least 8 hours.   Comment 1 Notify RN    Comment 2 Document in Chart   Glucose, capillary     Status: Abnormal   Collection Time: 07/24/24  9:40 PM  Result Value Ref Range   Glucose-Capillary 165 (H) 70 - 99 mg/dL    Comment: Glucose reference range applies only to samples taken after fasting for at least 8 hours.   Comment 1 Notify RN   Comprehensive metabolic panel with GFR     Status: Abnormal   Collection Time: 07/25/24  5:18 AM  Result Value Ref Range   Sodium 129 (L) 135 - 145 mmol/L   Potassium 4.7 3.5 - 5.1 mmol/L   Chloride 101 98 - 111 mmol/L   CO2 19 (L) 22 - 32 mmol/L   Glucose, Bld 155 (H) 70 - 99 mg/dL    Comment: Glucose reference range applies only to samples taken after fasting for at least 8 hours.   BUN 51 (H) 8 - 23 mg/dL   Creatinine, Ser 8.05 (H) 0.44 - 1.00 mg/dL   Calcium  7.9 (L) 8.9 - 10.3 mg/dL    Total Protein 5.8 (L) 6.5 - 8.1 g/dL   Albumin  3.2 (L) 3.5 - 5.0 g/dL   AST 15 15 - 41 U/L   ALT <5 0 - 44 U/L   Alkaline Phosphatase 67 38 - 126 U/L   Total Bilirubin 0.3 0.0 - 1.2 mg/dL   GFR, Estimated 26 (L) >60 mL/min    Comment: (NOTE) Calculated using the CKD-EPI Creatinine Equation (2021)    Anion gap 9 5 - 15    Comment: Performed at [facility], [address] [place], [place] [address]  Glucose, capillary     Status: Abnormal   Collection Time: 07/25/24  8:50 AM  Result Value Ref Range   Glucose-Capillary 150 (H) 70 - 99 mg/dL    Comment: Glucose reference range applies only to samples taken after fasting for at least 8 hours.   Comment 1 Notify RN  Comment 2 Document in Chart   Glucose, capillary     Status: Abnormal   Collection Time: 07/25/24 11:56 AM  Result Value Ref Range   Glucose-Capillary 163 (H) 70 - 99 mg/dL    Comment: Glucose reference range applies only to samples taken after fasting for at least 8 hours.   Comment 1 Notify RN    Comment 2 Document in Chart   Glucose, capillary     Status: Abnormal   Collection Time: 07/25/24  5:06 PM  Result Value Ref Range   Glucose-Capillary 171 (H) 70 - 99 mg/dL    Comment: Glucose reference range applies only to samples taken after fasting for at least 8 hours.  Glucose, capillary     Status: Abnormal   Collection Time: 07/25/24  9:34 PM  Result Value Ref Range   Glucose-Capillary 185 (H) 70 - 99 mg/dL    Comment: Glucose reference range applies only to samples taken after fasting for at least 8 hours.  Basic metabolic panel with GFR     Status: Abnormal   Collection Time: 07/26/24  4:46 AM  Result Value Ref Range   Sodium 130 (L) 135 - 145 mmol/L   Potassium 4.7 3.5 - 5.1 mmol/L   Chloride 102 98 - 111 mmol/L   CO2 19 (L) 22 - 32 mmol/L   Glucose, Bld 153 (H) 70 - 99 mg/dL    Comment: Glucose reference range applies only to samples taken after fasting for at least 8 hours.   BUN 50 (H) 8 - 23  mg/dL   Creatinine, Ser 8.14 (H) 0.44 - 1.00 mg/dL   Calcium  8.0 (L) 8.9 - 10.3 mg/dL   GFR, Estimated 28 (L) >60 mL/min    Comment: (NOTE) Calculated using the CKD-EPI Creatinine Equation (2021)    Anion gap 9 5 - 15    Comment: Performed at [facility], [address] [place], [place] [address]  Glucose, capillary     Status: Abnormal   Collection Time: 07/26/24  8:27 AM  Result Value Ref Range   Glucose-Capillary 163 (H) 70 - 99 mg/dL    Comment: Glucose reference range applies only to samples taken after fasting for at least 8 hours.   Comment 1 Notify RN    Comment 2 Document in Chart   Glucose, capillary     Status: Abnormal   Collection Time: 07/26/24 11:37 AM  Result Value Ref Range   Glucose-Capillary 203 (H) 70 - 99 mg/dL    Comment: Glucose reference range applies only to samples taken after fasting for at least 8 hours.   Comment 1 Notify RN    Comment 2 Document in Chart   POCT glycosylated hemoglobin (Hb A1C)     Status: Abnormal   Collection Time: 08/29/24  8:29 AM  Result Value Ref Range   Hemoglobin A1C 7.2 (A) 4.0 - 5.6 %   HbA1c POC (<> result, manual entry)     HbA1c, POC (prediabetic range)     HbA1c, POC (controlled diabetic range)      Diabetic Foot Exam:     PHQ2/9:    08/29/2024    8:13 AM 04/20/2024   10:44 AM 04/12/2024    8:11 AM 01/13/2024   10:06 AM 12/27/2023    2:08 PM  Depression screen PHQ 2/9  Decreased Interest 0 0 0 0 0  Down, Depressed, Hopeless 0 0 0 0 0  PHQ - 2 Score 0 0 0 0 0  Altered sleeping  0 0    Tired, decreased energy  0 0    Change in appetite  0 0    Feeling bad or failure about yourself   0 0    Trouble concentrating  0 0    Moving slowly or fidgety/restless  0 0    Suicidal thoughts  0 0    PHQ-9 Score  0  0     Difficult doing work/chores   Not difficult at all       Data saved with a previous flowsheet row definition    phq 9 is negative  Fall Risk:    08/29/2024    8:13 AM 07/17/2024    8:59  AM 04/17/2024    8:53 AM 04/12/2024    8:01 AM 01/13/2024   10:05 AM  Fall Risk   Falls in the past year? 0 0 0 0 0  Number falls in past yr: 0 0 0 0 0  Injury with Fall? 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 Kidney function improved post-hospitalization. Dyslipidemia managed with pravastatin . - Ordered blood work for kidney function, liver function, urine protein, vitamin D , parathyroid  levels. - Continue Trulicity  3 mg and Humalog  mix 75/25, 10-12 units twice daily. - Monitor blood glucose levels and log for three days if levels are off. - Continue pravastatin  80 mg.  Hypertensive heart disease with heart failure Blood pressure controlled at 134/72. No heart failure symptoms. - Continue abiraterone 300 mg. - Use Lasix  as needed.  Chronic atrial fibrillation Managed with Eliquis  and Cardizem . Rhythm irregular but controlled. - Continue Eliquis  5 mg twice daily. - Continue Cardizem  CD 240 mg once daily.  Pulmonary hypertension Managed through blood pressure control. Regular echocardiograms by cardiologist. - Continue current blood pressure management. - Ensure regular follow-up with cardiologist.  Morbid obesity Weight decreased from 345 lbs to 325.9 lbs. BMI decreased. - Encouraged continued dietary management and weight loss efforts.  Obstructive sleep apnea on CPAP Managed with CPAP, beneficial. - Continue CPAP therapy nightly.  Moderate persistent asthma Well-controlled, no symptoms. - Continue Advair inhaler as needed.  Gout No current symptoms.  Status post open repair of incarcerated  ventral hernia with mesh Post-operative recovery progressing well. - Advised to avoid rubbing the scab and apply Vaseline if needed.  Iron  deficiency anemia Noted upon hospital discharge. - Ordered CBC to check hemoglobin levels. - Schedule follow-up with hematologist.  General health maintenance Routine health maintenance ongoing. - Scheduled follow-up appointment in four months.        [1]  Current Outpatient Medications:    acetaminophen  (TYLENOL ) 650 MG CR tablet, Take 650 mg by mouth in the morning and at bedtime., Disp: , Rfl:    Albuterol -Budesonide  (AIRSUPRA ) 90-80 MCG/ACT AERO, Inhale 2 puffs into the lungs 4 (four) times daily as needed., Disp: 10.7 g, Rfl: 1   Alcohol Swabs (DROPSAFE ALCOHOL PREP) 70 % PADS, USE AS DIRECTED, Disp: 300 each, Rfl: 3   allopurinol  (ZYLOPRIM ) 100 MG tablet, TAKE 1 TABLET TWICE DAILY, Disp: 180 tablet, Rfl: 3   apixaban  (ELIQUIS ) 5 MG  TABS tablet, Take 1 tablet (5 mg total) by mouth 2 (two) times daily., Disp: 180 tablet, Rfl: 1   Blood Glucose Calibration (TRUE METRIX LEVEL 1) Low SOLN, 1 each by In Vitro route once a week., Disp: 1 each, Rfl: 1   Blood Glucose Monitoring Suppl (TRUE METRIX AIR GLUCOSE METER) w/Device KIT, Inject 1 each into the skin 4 (four) times daily as needed. Check fsbs 4 times daily E11.22, Disp: 1 kit, Rfl: 0   chlorthalidone  (HYGROTON ) 25 MG tablet, Take 12.5 mg by mouth daily., Disp: , Rfl:    Cholecalciferol  (VITAMIN D ) 2000 UNITS tablet, Take 2,000 Units by mouth daily., Disp: , Rfl:    colchicine  (COLCRYS ) 0.6 MG tablet, Take 1-2 tablets (0.6-1.2 mg total) by mouth daily as needed., Disp: 30 tablet, Rfl: 0   diltiazem  (CARDIZEM  CD) 240 MG 24 hr capsule, TAKE 1 CAPSULE EVERY DAY, Disp: 90 capsule, Rfl: 3   DROPLET PEN NEEDLES 30G X 8 MM MISC, USE TWICE DAILY WITH INSULIN , Disp: 200 each, Rfl: 3   Dulaglutide  (TRULICITY ) 3 MG/0.5ML SOAJ, Inject 3 mg as directed once a week., Disp: 6 mL, Rfl: 3   Elastic Bandages &  Supports (MEDICAL COMPRESSION STOCKINGS) MISC, 2 each by Does not apply route daily., Disp: 2 each, Rfl: 2   Ferrous Sulfate (IRON  SUPPLEMENT PO), Take 1 tablet by mouth daily., Disp: , Rfl:    fluticasone  (FLONASE ) 50 MCG/ACT nasal spray, USE 2 SPRAYS IN EACH NOSTRIL EVERY DAY, Disp: 48 g, Rfl: 3   fluticasone -salmeterol (ADVAIR) 250-50 MCG/ACT AEPB, Inhale 1 puff into the lungs in the morning and at bedtime., Disp: 180 each, Rfl: 1   furosemide  (LASIX ) 20 MG tablet, Take 1 tablet (20 mg total) by mouth 2 (two) times daily. (Patient taking differently: Take 20 mg by mouth as needed for fluid.), Disp: 180 tablet, Rfl: 1   Insulin  Lispro Prot & Lispro (HUMALOG  MIX 75/25 KWIKPEN) (75-25) 100 UNIT/ML Kwikpen, Inject 10-12 Units into the skin 2 (two) times daily., Disp: 45 mL, Rfl: 3   irbesartan  (AVAPRO ) 300 MG tablet, Take 1 tablet (300 mg total) by mouth daily., Disp: 90 tablet, Rfl: 1   loratadine  (CLARITIN ) 10 MG tablet, TAKE 1 TABLET(10 MG) BY MOUTH DAILY, Disp: 30 tablet, Rfl: 3   pravastatin  (PRAVACHOL ) 80 MG tablet, Take 1 tablet (80 mg total) by mouth every evening., Disp: 90 tablet, Rfl: 1   TRUE METRIX BLOOD GLUCOSE TEST test strip, CHECK BLOOD SUGAR FOUR TIMES DAILY, Disp: 400 strip, Rfl: 3   TRUEplus Lancets 33G MISC, TEST BLOOD SUGAR THREE TIMES DAILY AS NEEDED, Disp: 300 each, Rfl: 3 [2]  Allergies Allergen Reactions   Farxiga  [Dapagliflozin ] Hives

## 2024-08-30 ENCOUNTER — Ambulatory Visit: Payer: Self-pay | Admitting: Family Medicine

## 2024-08-30 LAB — COMPREHENSIVE METABOLIC PANEL WITH GFR
AG Ratio: 1.3 (calc) (ref 1.0–2.5)
ALT: 6 U/L (ref 6–29)
AST: 15 U/L (ref 10–35)
Albumin: 3.7 g/dL (ref 3.6–5.1)
Alkaline phosphatase (APISO): 94 U/L (ref 37–153)
BUN/Creatinine Ratio: 39 (calc) — ABNORMAL HIGH (ref 6–22)
BUN: 80 mg/dL — ABNORMAL HIGH (ref 7–25)
CO2: 30 mmol/L (ref 20–32)
Calcium: 8.7 mg/dL (ref 8.6–10.4)
Chloride: 99 mmol/L (ref 98–110)
Creat: 2.05 mg/dL — ABNORMAL HIGH (ref 0.60–1.00)
Globulin: 2.9 g/dL (ref 1.9–3.7)
Glucose, Bld: 178 mg/dL — ABNORMAL HIGH (ref 65–99)
Potassium: 4.5 mmol/L (ref 3.5–5.3)
Sodium: 136 mmol/L (ref 135–146)
Total Bilirubin: 0.4 mg/dL (ref 0.2–1.2)
Total Protein: 6.6 g/dL (ref 6.1–8.1)
eGFR: 25 mL/min/1.73m2 — ABNORMAL LOW

## 2024-08-30 LAB — LIPID PANEL
Cholesterol: 138 mg/dL
HDL: 52 mg/dL
LDL Cholesterol (Calc): 70 mg/dL
Non-HDL Cholesterol (Calc): 86 mg/dL
Total CHOL/HDL Ratio: 2.7 (calc)
Triglycerides: 78 mg/dL

## 2024-08-30 LAB — MICROALBUMIN / CREATININE URINE RATIO
Creatinine, Urine: 72 mg/dL (ref 20–275)
Microalb Creat Ratio: 103 mg/g{creat} — ABNORMAL HIGH
Microalb, Ur: 7.4 mg/dL

## 2024-08-30 LAB — CBC WITH DIFFERENTIAL/PLATELET
Absolute Lymphocytes: 1374 {cells}/uL (ref 850–3900)
Absolute Monocytes: 677 {cells}/uL (ref 200–950)
Basophils Absolute: 51 {cells}/uL (ref 0–200)
Basophils Relative: 0.5 %
Eosinophils Absolute: 394 {cells}/uL (ref 15–500)
Eosinophils Relative: 3.9 %
HCT: 31.4 % — ABNORMAL LOW (ref 35.9–46.0)
Hemoglobin: 9.8 g/dL — ABNORMAL LOW (ref 11.7–15.5)
MCH: 28.6 pg (ref 27.0–33.0)
MCHC: 31.2 g/dL — ABNORMAL LOW (ref 31.6–35.4)
MCV: 91.5 fL (ref 81.4–101.7)
MPV: 12.1 fL (ref 7.5–12.5)
Monocytes Relative: 6.7 %
Neutro Abs: 7605 {cells}/uL (ref 1500–7800)
Neutrophils Relative %: 75.3 %
Platelets: 253 Thousand/uL (ref 140–400)
RBC: 3.43 Million/uL — ABNORMAL LOW (ref 3.80–5.10)
RDW: 14.4 % (ref 11.0–15.0)
Total Lymphocyte: 13.6 %
WBC: 10.1 Thousand/uL (ref 3.8–10.8)

## 2024-08-30 LAB — URIC ACID: Uric Acid, Serum: 7.9 mg/dL — ABNORMAL HIGH (ref 2.5–7.0)

## 2024-08-30 LAB — VITAMIN D 25 HYDROXY (VIT D DEFICIENCY, FRACTURES): Vit D, 25-Hydroxy: 51 ng/mL (ref 30–100)

## 2024-08-30 LAB — PTH, INTACT AND CALCIUM
Calcium: 8.7 mg/dL (ref 8.6–10.4)
PTH: 118 pg/mL — ABNORMAL HIGH (ref 16–77)

## 2024-08-30 NOTE — Discharge Summary (Signed)
 Patient ID: BRIGITTE SODERBERG MRN: [id number] DOB/AGE: 1945-10-30 78 y.o.  Admit date: 07/21/2024 Discharge date: 08/30/2024   Discharge Diagnoses:  Principal Problem:   Small bowel obstruction (HCC) Active Problems:   Benign essential hypertension   Atrial fibrillation (HCC)   CKD stage 3b, GFR 30-44 ml/min (HCC)   Morbid obesity (HCC)   Obstructive sleep apnea on CPAP   Controlled type 2 diabetes mellitus with microalbuminuria, with long-term current use of insulin  (HCC)   Partial small bowel obstruction (HCC)   Incarcerated ventral hernia   (HFpEF) heart failure with preserved ejection fraction (HCC)   Procedures:exlap with SBR and ventral hernia repair  Hospital Course:   Presented with small bowel obstruction and ventral hernia.  She was taken to the OR for ex lap with ventral hernia repair and small bowel resection.  She was kept n.p.o. and then slowly advancing her diet over the next couple of days when she had returned of bowel function.  At time of discharge she was tolerating a diet and ambulating around the room.  Consults: None  Disposition: Discharge disposition: 01-Home or Self Care       Discharge Instructions     Call MD for:  difficulty breathing, headache or visual disturbances   Complete by: As directed    Call MD for:  extreme fatigue   Complete by: As directed    Call MD for:  hives   Complete by: As directed    Call MD for:  persistant dizziness or light-headedness   Complete by: As directed    Call MD for:  persistant nausea and vomiting   Complete by: As directed    Call MD for:  redness, tenderness, or signs of infection (pain, swelling, redness, odor or green/yellow discharge around incision site)   Complete by: As directed    Call MD for:  severe uncontrolled pain   Complete by: As directed    Call MD for:  temperature >100.4   Complete by: As directed    Diet - low sodium heart healthy   Complete by: As directed    Discharge  instructions   Complete by: As directed    You may shower at home.  Please let the warm water run of your incisions and pat dry.  You should follow-up in clinic on December 16 for staple removal.  Please refrain from lifting greater than 10 to 15 pounds for 6 weeks after surgery.  Please do not submerge the wounds in water such as a bath until your postoperative visit.  You may take Tylenol  and ibuprofen as needed for pain.  You may wear abdominal binder as needed for comfort.   Increase activity slowly   Complete by: As directed       Allergies as of 07/26/2024       Reactions   Farxiga  [dapagliflozin ] Hives        Medication List     TAKE these medications    acetaminophen  650 MG CR tablet Commonly known as: TYLENOL  Take 650 mg by mouth in the morning and at bedtime.   Airsupra  90-80 MCG/ACT Aero Generic drug: Albuterol -Budesonide  Inhale 2 puffs into the lungs 4 (four) times daily as needed.   allopurinol  100 MG tablet Commonly known as: ZYLOPRIM  TAKE 1 TABLET TWICE DAILY   chlorthalidone  25 MG tablet Commonly known as: HYGROTON  Take 12.5 mg by mouth daily.   colchicine  0.6 MG tablet Commonly known as: Colcrys  Take 1-2 tablets (0.6-1.2 mg total) by mouth daily  as needed.   diltiazem  240 MG 24 hr capsule Commonly known as: CARDIZEM  CD TAKE 1 CAPSULE EVERY DAY   Droplet Pen Needles 30G X 8 MM Misc Generic drug: Insulin  Pen Needle USE TWICE DAILY WITH INSULIN    DropSafe Alcohol Prep 70 % Pads USE AS DIRECTED   fluticasone  50 MCG/ACT nasal spray Commonly known as: FLONASE  USE 2 SPRAYS IN EACH NOSTRIL EVERY DAY   furosemide  20 MG tablet Commonly known as: LASIX  Take 1 tablet (20 mg total) by mouth 2 (two) times daily. What changed:  when to take this reasons to take this   Insulin  Lispro Prot & Lispro (75-25) 100 UNIT/ML Kwikpen Commonly known as: HumaLOG  Mix 75/25 KwikPen Inject 10-12 Units into the skin 2 (two) times daily.   irbesartan  300 MG  tablet Commonly known as: AVAPRO  Take 1 tablet (300 mg total) by mouth daily.   IRON  SUPPLEMENT PO Take 1 tablet by mouth daily.   loratadine  10 MG tablet Commonly known as: CLARITIN  TAKE 1 TABLET(10 MG) BY MOUTH DAILY   Medical Compression Stockings Misc 2 each by Does not apply route daily.   pravastatin  80 MG tablet Commonly known as: PRAVACHOL  Take 1 tablet (80 mg total) by mouth every evening.   True Metrix Air Glucose Meter w/Device Kit Inject 1 each into the skin 4 (four) times daily as needed. Check fsbs 4 times daily E11.22   True Metrix Blood Glucose Test test strip Generic drug: glucose blood CHECK BLOOD SUGAR FOUR TIMES DAILY   True Metrix Level 1 Low Soln 1 each by In Vitro route once a week.   TRUEplus Lancets 33G Misc TEST BLOOD SUGAR THREE TIMES DAILY AS NEEDED   Trulicity  3 MG/0.5ML Soaj Generic drug: Dulaglutide  Inject 3 mg as directed once a week.   Vitamin D  50 MCG (2000 UT) tablet Take 2,000 Units by mouth daily.        Contact information for after-discharge care     Home Medical Care     [facility]) .   Service: Home Health Services Contact information: [address] [place]  [address] [phone]                      Jayson Endow, M.D.

## 2024-08-31 ENCOUNTER — Other Ambulatory Visit: Payer: Self-pay

## 2024-08-31 DIAGNOSIS — N1832 Chronic kidney disease, stage 3b: Secondary | ICD-10-CM

## 2024-10-04 ENCOUNTER — Ambulatory Visit: Admitting: Cardiovascular Disease

## 2024-10-10 ENCOUNTER — Ambulatory Visit: Admitting: Cardiovascular Disease

## 2024-11-05 ENCOUNTER — Other Ambulatory Visit

## 2024-12-28 ENCOUNTER — Ambulatory Visit: Admitting: Family Medicine
